# Patient Record
Sex: Female | Born: 1940 | Race: White | Hispanic: No | Marital: Married | Smoking: Current every day smoker
Health system: Southern US, Community
[De-identification: ages and names within clinical notes are randomized; demographics above are authoritative.]

## PROBLEM LIST (undated history)

## (undated) DIAGNOSIS — Q2381 Bicuspid aortic valve: Secondary | ICD-10-CM

## (undated) DIAGNOSIS — I480 Paroxysmal atrial fibrillation: Secondary | ICD-10-CM

## (undated) DIAGNOSIS — E039 Hypothyroidism, unspecified: Secondary | ICD-10-CM

## (undated) DIAGNOSIS — D696 Thrombocytopenia, unspecified: Secondary | ICD-10-CM

## (undated) DIAGNOSIS — G4730 Sleep apnea, unspecified: Secondary | ICD-10-CM

## (undated) DIAGNOSIS — E785 Hyperlipidemia, unspecified: Secondary | ICD-10-CM

## (undated) DIAGNOSIS — M5430 Sciatica, unspecified side: Secondary | ICD-10-CM

## (undated) DIAGNOSIS — T7840XA Allergy, unspecified, initial encounter: Secondary | ICD-10-CM

## (undated) DIAGNOSIS — D649 Anemia, unspecified: Secondary | ICD-10-CM

## (undated) DIAGNOSIS — I10 Essential (primary) hypertension: Secondary | ICD-10-CM

## (undated) DIAGNOSIS — C801 Malignant (primary) neoplasm, unspecified: Secondary | ICD-10-CM

## (undated) DIAGNOSIS — F419 Anxiety disorder, unspecified: Secondary | ICD-10-CM

## (undated) DIAGNOSIS — M1990 Unspecified osteoarthritis, unspecified site: Secondary | ICD-10-CM

## (undated) DIAGNOSIS — E079 Disorder of thyroid, unspecified: Secondary | ICD-10-CM

## (undated) HISTORY — DX: Hyperlipidemia, unspecified: E78.5

## (undated) HISTORY — DX: Essential (primary) hypertension: I10

## (undated) HISTORY — DX: Allergy, unspecified, initial encounter: T78.40XA

## (undated) HISTORY — DX: Anemia, unspecified: D64.9

## (undated) HISTORY — DX: Sciatica, unspecified side: M54.30

## (undated) HISTORY — DX: Malignant (primary) neoplasm, unspecified: C80.1

## (undated) HISTORY — PX: BILATERAL CARPAL TUNNEL RELEASE: SHX6508

## (undated) HISTORY — PX: JOINT REPLACEMENT: SHX530

## (undated) HISTORY — PX: ABDOMINAL HYSTERECTOMY: SHX81

## (undated) HISTORY — PX: SPINAL CORD STIMULATOR IMPLANT: SHX2422

## (undated) HISTORY — PX: BREAST CYST ASPIRATION: SHX578

## (undated) HISTORY — PX: COLONOSCOPY: SHX5424

## (undated) HISTORY — PX: APPENDECTOMY: SHX54

## (undated) HISTORY — PX: KNEE SURGERY: SHX244

## (undated) HISTORY — PX: OTHER SURGICAL HISTORY: SHX169

## (undated) HISTORY — DX: Disorder of thyroid, unspecified: E07.9

## (undated) HISTORY — PX: BACK SURGERY: SHX140

## (undated) HISTORY — DX: Thrombocytopenia, unspecified: D69.6

---

## 2001-06-03 ENCOUNTER — Ambulatory Visit (HOSPITAL_COMMUNITY): Admission: RE | Admit: 2001-06-03 | Discharge: 2001-06-03 | Payer: Self-pay | Admitting: Orthopedic Surgery

## 2001-06-03 ENCOUNTER — Encounter: Payer: Self-pay | Admitting: Orthopedic Surgery

## 2001-08-17 ENCOUNTER — Ambulatory Visit (HOSPITAL_COMMUNITY): Admission: RE | Admit: 2001-08-17 | Discharge: 2001-08-17 | Payer: Self-pay | Admitting: Orthopedic Surgery

## 2003-06-05 ENCOUNTER — Encounter: Payer: Self-pay | Admitting: Orthopedic Surgery

## 2003-06-06 ENCOUNTER — Inpatient Hospital Stay (HOSPITAL_COMMUNITY): Admission: RE | Admit: 2003-06-06 | Discharge: 2003-06-10 | Payer: Self-pay | Admitting: Orthopedic Surgery

## 2003-06-06 ENCOUNTER — Encounter: Payer: Self-pay | Admitting: Orthopedic Surgery

## 2004-01-31 ENCOUNTER — Encounter: Admission: RE | Admit: 2004-01-31 | Discharge: 2004-01-31 | Payer: Self-pay | Admitting: Orthopedic Surgery

## 2004-11-26 ENCOUNTER — Ambulatory Visit: Payer: Self-pay | Admitting: Internal Medicine

## 2005-03-12 ENCOUNTER — Ambulatory Visit (HOSPITAL_COMMUNITY): Admission: RE | Admit: 2005-03-12 | Discharge: 2005-03-12 | Payer: Self-pay | Admitting: Orthopedic Surgery

## 2005-03-12 ENCOUNTER — Ambulatory Visit (HOSPITAL_BASED_OUTPATIENT_CLINIC_OR_DEPARTMENT_OTHER): Admission: RE | Admit: 2005-03-12 | Discharge: 2005-03-12 | Payer: Self-pay | Admitting: Orthopedic Surgery

## 2005-07-06 ENCOUNTER — Ambulatory Visit: Payer: Self-pay | Admitting: Internal Medicine

## 2006-04-21 ENCOUNTER — Ambulatory Visit: Payer: Self-pay

## 2006-05-05 ENCOUNTER — Encounter: Admission: RE | Admit: 2006-05-05 | Discharge: 2006-05-05 | Payer: Self-pay | Admitting: Orthopedic Surgery

## 2007-03-02 ENCOUNTER — Encounter: Admission: RE | Admit: 2007-03-02 | Discharge: 2007-03-02 | Payer: Self-pay | Admitting: Orthopedic Surgery

## 2007-04-22 ENCOUNTER — Ambulatory Visit (HOSPITAL_COMMUNITY): Admission: RE | Admit: 2007-04-22 | Discharge: 2007-04-24 | Payer: Self-pay | Admitting: Orthopedic Surgery

## 2007-07-29 ENCOUNTER — Ambulatory Visit: Payer: Self-pay | Admitting: Internal Medicine

## 2007-08-06 ENCOUNTER — Ambulatory Visit: Payer: Self-pay | Admitting: Family Medicine

## 2007-10-18 ENCOUNTER — Ambulatory Visit: Payer: Self-pay | Admitting: Internal Medicine

## 2007-12-24 ENCOUNTER — Ambulatory Visit: Payer: Self-pay | Admitting: Internal Medicine

## 2008-06-12 ENCOUNTER — Ambulatory Visit (HOSPITAL_BASED_OUTPATIENT_CLINIC_OR_DEPARTMENT_OTHER): Admission: RE | Admit: 2008-06-12 | Discharge: 2008-06-12 | Payer: Self-pay | Admitting: Orthopedic Surgery

## 2008-08-09 ENCOUNTER — Ambulatory Visit: Payer: Self-pay | Admitting: Internal Medicine

## 2008-08-22 ENCOUNTER — Ambulatory Visit: Payer: Self-pay | Admitting: Internal Medicine

## 2008-08-28 ENCOUNTER — Ambulatory Visit: Payer: Self-pay | Admitting: Internal Medicine

## 2008-09-22 ENCOUNTER — Ambulatory Visit: Payer: Self-pay | Admitting: Internal Medicine

## 2008-11-10 ENCOUNTER — Ambulatory Visit: Payer: Self-pay | Admitting: Internal Medicine

## 2008-11-16 ENCOUNTER — Inpatient Hospital Stay (HOSPITAL_COMMUNITY): Admission: RE | Admit: 2008-11-16 | Discharge: 2008-11-20 | Payer: Self-pay | Admitting: Orthopedic Surgery

## 2008-12-21 ENCOUNTER — Ambulatory Visit: Payer: Self-pay | Admitting: Internal Medicine

## 2009-02-20 ENCOUNTER — Ambulatory Visit: Payer: Self-pay | Admitting: Internal Medicine

## 2009-02-28 ENCOUNTER — Ambulatory Visit: Payer: Self-pay | Admitting: Internal Medicine

## 2009-03-22 ENCOUNTER — Ambulatory Visit: Payer: Self-pay | Admitting: Internal Medicine

## 2009-08-22 ENCOUNTER — Ambulatory Visit: Payer: Self-pay | Admitting: Internal Medicine

## 2009-12-20 ENCOUNTER — Ambulatory Visit: Payer: Self-pay | Admitting: Internal Medicine

## 2010-07-08 ENCOUNTER — Ambulatory Visit: Payer: Self-pay | Admitting: Anesthesiology

## 2010-10-02 ENCOUNTER — Ambulatory Visit: Payer: Self-pay | Admitting: Internal Medicine

## 2010-10-06 ENCOUNTER — Ambulatory Visit: Payer: Self-pay | Admitting: Anesthesiology

## 2011-01-02 LAB — PROTIME-INR
INR: 2.4 — ABNORMAL HIGH (ref 0.00–1.49)
Prothrombin Time: 27.8 seconds — ABNORMAL HIGH (ref 11.6–15.2)

## 2011-01-07 LAB — BASIC METABOLIC PANEL
BUN: 5 mg/dL — ABNORMAL LOW (ref 6–23)
Chloride: 101 mEq/L (ref 96–112)
Creatinine, Ser: 0.52 mg/dL (ref 0.4–1.2)
Creatinine, Ser: 0.62 mg/dL (ref 0.4–1.2)
GFR calc Af Amer: >60 mL/min (ref >60)
GFR calc Af Amer: >60 mL/min (ref >60)
GFR calc non Af Amer: >60 mL/min (ref >60)
GFR calc non Af Amer: >60 mL/min (ref >60)
GFR calc non Af Amer: >60 mL/min (ref >60)
Potassium: 3.1 mEq/L — ABNORMAL LOW (ref 3.5–5.1)
Potassium: 3.3 mEq/L — ABNORMAL LOW (ref 3.5–5.1)
Potassium: 3.6 mEq/L (ref 3.5–5.1)
Potassium: 4.3 mEq/L (ref 3.5–5.1)
Sodium: 136 mEq/L (ref 135–145)
Sodium: 137 mEq/L (ref 135–145)
Sodium: 144 mEq/L (ref 135–145)

## 2011-01-07 LAB — TYPE AND SCREEN: Antibody Screen: NEGATIVE

## 2011-01-07 LAB — CBC
HCT: 32 % — ABNORMAL LOW (ref 36.0–46.0)
HCT: 45.4 % (ref 36.0–46.0)
Hemoglobin: 15.3 g/dL — ABNORMAL HIGH (ref 12.0–15.0)
MCV: 92.3 fL (ref 78.0–100.0)
Platelets: 113 10*3/uL — ABNORMAL LOW (ref 150–400)
RBC: 3.24 MIL/uL — ABNORMAL LOW (ref 3.87–5.11)
RBC: 3.62 MIL/uL — ABNORMAL LOW (ref 3.87–5.11)
RBC: 4.84 MIL/uL (ref 3.87–5.11)
WBC: 7.2 10*3/uL (ref 4.0–10.5)
WBC: 7.9 10*3/uL (ref 4.0–10.5)
WBC: 8.8 10*3/uL (ref 4.0–10.5)
WBC: 9.2 10*3/uL (ref 4.0–10.5)

## 2011-01-07 LAB — PROTIME-INR
INR: 2.5 — ABNORMAL HIGH (ref 0.00–1.49)
Prothrombin Time: 15.7 seconds — ABNORMAL HIGH (ref 11.6–15.2)
Prothrombin Time: 21.4 seconds — ABNORMAL HIGH (ref 11.6–15.2)
Prothrombin Time: 28.5 seconds — ABNORMAL HIGH (ref 11.6–15.2)

## 2011-01-07 LAB — APTT: aPTT: 32 seconds (ref 24–37)

## 2011-02-04 NOTE — Op Note (Signed)
NAME:  [name], [name]             ACCOUNT NO.:  [id number]   MEDICAL RECORD NO.:  [id number]          PATIENT TYPE:  AMB   LOCATION:  DAY                          FACILITY:  [facility]   PHYSICIAN:  Marlowe Kays, M.D.  DATE OF BIRTH:  November 22, 1940   DATE OF PROCEDURE:  04/22/2007  DATE OF DISCHARGE:                               OPERATIVE REPORT   PREOPERATIVE DIAGNOSIS:  Spinal stenosis, L4-5.   POSTOPERATIVE DIAGNOSIS:  Spinal stenosis, L4-5.   OPERATION:  Central and foraminal decompressive laminectomy, L4-5.   SURGEON:  Marlowe Kays, M.D.   ASSISTANT:  Georges Lynch. Darrelyn Hillock, M.D.   ANESTHESIA:  General.   PATHOLOGY AND INDICATION FOR PROCEDURE:  She has had back and bilateral  leg pain, left much more severe than the right, and over the last 2  weeks has noted progressive dragging of her left leg and demonstrates  some weakness of dorsiflexion of her foot today on examination.  She has  had both an MRI, which demonstrated no definite operative indication  with some disk bulges at a number of levels but a suggestion of spinal  stenosis.  For this reason a lumbar myelogram and CT scan was performed  on March 02, 2007, which the dominant finding being lateral recess  stenosis at L4-5 on the left.  There was not felt to be a significant  stenosis at L3-4 or above or at L5-S1.  With the progressive findings  and weakness, it was felt that the L4-5 decompression was the  appropriate procedure.   PROCEDURE:  Prophylactic antibiotics, satisfactory general anesthesia,  prone position on the Wilson frame to protect her knee replacement.  Back was prepped with DuraPrep and with three spinal needles and lateral  x-ray, I tentatively located the L4-5 operative field and I then  continued draping the back in a sterile field.  Time-out performed.  Midline incision.  The two spinous processes in the center of the wound  were marked as L3 and L4 on the lateral x-ray.  Accordingly, I extended  the incision distalward and continued dissecting the soft tissue off  what we felt was L5, L4 and working up to L3 neural arches.  Two self-  retaining McCullough retractors were inserted, then with a double-action  rongeur removed the spinous process of L4, working up to L3 neural arch  and also downward, removing a little bit of the superior neural arch of  L5.  Then began more detailed decompression with 2 and 3-mm Kerrison  rongeurs, ultimately bringing in a microscope, and we took finally a  third x-ray to be sure that the limits of the decompression were from L4  to L5.  Clinically she had satisfactory decompression to hockey stick  cephalad and caudad.  This third x-ray confirmed that we had the limits  of the decompression as desired.  After completing central and lateral  recess decompression, all foramina were patent to hockey stick,  particularly at L4-5 on the left.  She also was widely patent both  proximal and distal centrally to hockey stick.  The wound then was then  well-irrigated with sterile  saline.  Gelfoam soaked in thrombin was  placed over the dura.  The self-retaining retractors were carefully  removed.  Closure was then performed with interrupted #1 Vicryl in the  paralumbar muscle and fascia as a unit, leaving the distal centimeter  and a half open for any egress of blood.  Subcutaneous tissue was  closed with a combination of #1 and 2-0 Vicryl, staples in the skin.  Betadine and Adaptic dry sterile dressing were applied.  She was gently  placed in log-roll fashion onto her PACU bed and taken there in  satisfactory condition with no known complications.  Estimated blood  loss was perhaps 150 mL, no blood replacement.           ______________________________  Marlowe Kays, M.D.     JA/MEDQ  D:  04/22/2007  T:  04/22/2007  Job:  [id number]

## 2011-02-04 NOTE — Op Note (Signed)
[name], [name]             ACCOUNT NO.:  [id number]   MEDICAL RECORD NO.:  [id number]          PATIENT TYPE:  INP   LOCATION:  1602                         FACILITY:  [facility]   PHYSICIAN:  Marlowe Kays, M.D.  DATE OF BIRTH:  07-13-1941   DATE OF PROCEDURE:  11/16/2008  DATE OF DISCHARGE:                               OPERATIVE REPORT   PREOPERATIVE DIAGNOSIS:  Osteoarthritis, left knee.   POSTOPERATIVE DIAGNOSIS:  Osteoarthritis, left knee.   OPERATION:  Osteonics total knee replacement, left.   SURGEON:  Marlowe Kays, M.D.   ASSISTANTDruscilla Brownie. Underwood, P.A.-C.   ANESTHESIA:  General.   PATHOLOGY AND JUSTIFICATION FOR PROCEDURE:  She has had a successful  total knee replacement on the right.  On the left, she has had  progressive pain which has proved refractory to viscosupplementation and  anti-inflammatory medication.  She has tricompartmental arthritis and is  here today for the above-mentioned surgery.   PROCEDURE:  Prophylactic antibiotics, satisfactory general anesthesia,  Foley catheter inserted, pneumatic tourniquet applied along with lateral  hip stabilizer and Sure Foot.  Left leg was prepped with DuraPrep from  tourniquet to ankle, draped in sterile field.  Ioban employed.  Leg  esmarch out nonsterilely.  Originally, tourniquet was inflated to 300  mmHg, but because some slight breakthrough bleeding, I increased it to  325 mm.  Time out performed.  Vertical midline incision down to the  patellar mechanism with median parapatellar incision to open the joint.  The patellar mechanism was freed up and patella everted and the knee  flexed.  Osteophytes were removed from around the femur and patella, and  the pes anserinus and medial collateral ligament were undermined off the  proximal tibia.  Portions of the menisci and ACL/PCL complex were  removed.  We then made a 5/16 inch drill hole in the distal femur  followed by canal finder and the axis aligner  set for 5 degrees distal  femoral cut.  Because she had a slight flexion contracture, I elected to  take 12 mm rather than the usual 10 off the distal femur.  I then went  to try and size the distal femur, but because of tightness in the knee,  I went ahead and made my initial leveling cut on the tibia which allowed  me to get the jig in for sizing the femur at size 5.  Scribe lines were  placed to apply the distal femoral cutting jig, and anterior and  posterior cuts and posterior/anterior chamferings were made.  I then  returned to the tibia and the baseplate measured and the size 5 there as  well, placing my initial step-cut drill hole followed by canal finder  and the axis aligner to apply a 90-degree cut with 4 mm of bone removed  from the medial depressed side.  After this cut was performed, I then  placed a laminar spreader removed remnants of bone and soft tissue from  behind the condyles, and I then placed the jig for creating both the  groove for the patella and the notchplasty.  After these operations were  performed, I then went through a trial reduction and found that we had  adequate bone resection with a 10-mm spacer, and we then proceeded to  prepare the patella.  She sized a 26 mm.  I used the 10-mm recess  cutting jig to make the recess cut followed by 3 stabilization holes.  I  then placed a trial patella in the insert and trimmed up excess bone  from around the perimeter.  I then returned to the tibia.  I previously  on checking sizing of the components had used extramedullary rod  splitting the bimalleolar distance and placing scribe lines on the  anterior tibia, and I then placed the baseplate stabilizing the 3 pins  based on these holes, and we used the tripod apparatus to ream for the  stem on the tibia component up to a 5 cemented.  I then water picked the  knee while the components were opened and the methacrylate was mixed and  centrifuged.  I then individually  glued in the components starting first  with the tibia, followed by the femur and then the patella, and in each  case, removing excess methacrylate.  When the methacrylate hardened, we  checked to make sure there were not any small areas that had been left  behind, and we then went through a trial reduction with the different  spacers, working up to 15 mm which seemed to be the best fit.  Accordingly, after irrigating the wound well, I placed the final 50-mm  posterior stabilized Scorpio insert.  The knee had excellent motion,  good stability, and no patellar lateral release was required.  Hemovac  was placed.  The wound closed in layers with #1 Vicryl in 2 layers the  quadriceps tendon, synovium and capsule distally.  Combination of #1 and  2-0 Vicryl in subcutaneous tissues, staples in the skin.  Dry sterile  dressing was applied.  Tourniquet was released prior to closure with  roughly an hour and a half of tourniquet time having elapsed.  There  were no operative complications and essentially no blood loss with no  blood replacement.           ______________________________  Marlowe Kays, M.D.     JA/MEDQ  D:  11/16/2008  T:  11/16/2008  Job:  [id number]

## 2011-02-04 NOTE — Op Note (Signed)
NAME:  [name], TEMKIN             ACCOUNT NO.:  [id number]   MEDICAL RECORD NO.:  [id number]          PATIENT TYPE:  AMB   LOCATION:  [facility]                         FACILITY:  [facility]   PHYSICIAN:  Marlowe Kays, M.D.  DATE OF BIRTH:  [birth year]   DATE OF PROCEDURE:  06/12/2008  DATE OF DISCHARGE:                               OPERATIVE REPORT   PREOPERATIVE DIAGNOSES:  1. Torn medial meniscus.  2. Osteoarthritis left knee.   POSTOPERATIVE DIAGNOSIS:  1. Torn medial and lateral menisci.  2. Osteoarthritis left knee.   OPERATION:  Left knee arthroscopy with  1. Partial medial and lateral meniscectomy.  2. Shaving of medial femoral condyle and lateral tibial plateau.   PATHOLOGY AND JUSTIFICATION FOR PROCEDURE:  She has a history of her  right total knee replacement and a prior arthroscopy in her left knee.  Recently she has had increased pain and swelling of left knee with an  MRI on May 30, 2008 demonstrated torn medial meniscus as well as  advanced osteoarthritis.  Accordingly, she is here for the above-  mentioned surgery.  She understands that the arthroscopy will not be  designed to particularly improve the status of her degenerative  arthritis.   PROCEDURE:  Prophylactic antibiotics, Ace wrap to right lower extremity  with knee support, left lower extremity pneumatic tourniquet with the  leg Esmarch out non sterilely.  Thigh stabilizer applied and leg was  prepped with DuraPrep from stabilizer to ankle and draped in sterile  field.  Time-out performed.  Superior medial saline inflow through the  old portal site.  First through an anterolateral portal, medial  compartment of the knee joint was evaluated.  She had floating areas of  articular cartilage and grade 3/4 wear of most of the medial femoral  condyle.  With a 3.5 shaver,  I debrided all of this down as well as  made a preliminary debridement of a badly torn remainder of the medial  meniscus in both the  posterior curve and the intercondylar areas.  I  then trimmed the meniscus back to a more stable rim with baskets and  shaved down once again with 3.5 shaver with final pictures being taken.  She also was noted to have ridged areas of significant chondromalacia of  the medial tibial plateau.  I tried to go up the medial gutter and  suprapatellar area but was unable to get the scope past the patella and  did not want to break it.  I then reversed portals and in the lateral  compartment she had a good bit of synovitis and tear of the anterior  third of the lateral meniscus as well as the posterior and middle  portions, all of which I shaved down until smooth with combination of  3.5 shaver, small baskets and arthroscopic scissors.  She also had an  deep gouge out of the lateral tibial plateau.  Her lateral femoral  condyle only had minimal wear.  Also from this portal I tried to get up  into the lateral gutter and suprapatellar area and once again was  stymied by obstruction, either fibrous  tissue or osteophytes and again  did not want to destroyed the scope.  The knee joint was irrigated until  clear and all fluid possible removed.  The two anterior portals were  closed with 4-0 nylon.  I then injected through the inflow apparatus 20  mL  of 0.5% Marcaine with adrenaline and then closed this portal with 4-0  nylon as well.  Betadine Adaptic, dry sterile dressing were applied.  Tourniquet was released.  She tolerated the procedure well and was taken  to recovery room in satisfactory condition with no known complications.           ______________________________  Marlowe Kays, M.D.     JA/MEDQ  D:  06/12/2008  T:  06/13/2008  Job:  [id number]

## 2011-02-04 NOTE — H&P (Signed)
NAME:  [name], [name]             ACCOUNT NO.:  [id number]   MEDICAL RECORD NO.:  [id number]          PATIENT TYPE:  INP   LOCATION:                               [facility]ACILITY[facility]  [facility]   PHYSICIAN:  Marlowe Kays, M.D.  DATE OF BIRTH:  10/21/1940   DATE OF ADMISSION:  11/16/2008  DATE OF DISCHARGE:                              HISTORY & PHYSICAL   CHIEF COMPLAINT:  Pain in my left knee.   PRESENT ILLNESS:  This 70 year old white female is seen by [place] for  continuing progressive problems concerning pain into her left knee.  We  have tried conservative care to the knee, and she really has not  responded.  On July 26, 2008, she received a corticosteroid injection  which gave her little to no relief.  We discussed viscosupplementation  into the knee, but since she has a considerable amount of osteoarthritis  in compartments of the knee and the fact that she did really respond to  that mode of treatment in her right knee prior to her right total knee  replacement arthroplasty, she declined that route.  We, therefore, made  plans to go for total knee replacement arthroplasty on the left.  All  risks and benefits of surgery were described to the patient.   PAST MEDICAL HISTORY:  This lady has been in relatively good health  throughout her lifetime.  She has been cleared preoperatively by Verlan Friends, FNP, with Dr. Beverely Risen.   ALLERGIES:  The patient is allergic to CODEINE and GUAIFENESIN.  The  patient is allergic to ADHESIVE TAPE, but she can use the Micropore  paper tape with no problem.   She is being treated for hypertension, sleep apnea,  hypercholesterolemia, reflux and hypothyroidism.  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  She uses  a CPAP machine at home with O2.   CURRENT MEDICATIONS:  1. Lexapro 10 mg daily.  2. Gemfibrozil 600 mg b.i.d.  3. Nexium 40 mg b.i.d.  4. Levothyroxine 137 mcg 1 daily.  5.  Bisoprolol/hydrochlorothiazide 5/6.25 mg daily.  6. Zyrtec 10 mg daily.  7. Furosemide 20 mg daily.  8. Tussionex suspension extended-release 1 teaspoon twice a day.  9. Alprazolam 0.5 mg b.i.d.  10.Requip 1 mg daily.  11.Zetia 10 mg daily.  12.Lipitor 20 mg daily.  13.Omeprazole 40 mg daily.  14.Trazodone 50 mg at bedtime.  15.Patanase nasal spray 1 spray per nostril once daily.  16.Fluticasone propionate nasal spray 1 spray per nostril daily.   PAST SURGERIES:  Include right total knee replacement arthroplasty in  2005, carpal tunnel release in 2007, back surgery in 2008.  She had  arthroscopic surgery in 2009.   FAMILY HISTORY:  Positive for her father deceased with tick fever at a  young age.  Her mother died of a stroke.  She has 4 sisters and 1  brother in relatively good health.  She has 4 boys, one deceased of  heart attack.   SOCIAL HISTORY:  The patient is married.  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  She does not smoke at the current time but has in  the past.  No intake of alcohol products.  Her caregiver will be her  husband after surgery with home health.  She lives in a single-level  home.   REVIEW OF SYSTEMS:  CNS: No seizures disorder, paralysis, numbness,  double vision.  RESPIRATORY:  No productive cough, no hemoptysis, no  shortness of breath.  CARDIOVASCULAR:  No chest pain, no angina, no  orthopnea.  GASTROINTESTINAL:  No nausea or vomiting, no melena, no  bloody stools.  GENITOURINARY:  No discharge, dysuria or hematuria.  MUSCULOSKELETAL:  Primarily in present illness.   PHYSICAL EXAMINATION:  GENERAL:  Alert, cooperative, friendly, somewhat  anxious 70 year old white female who is 5 feet 3 inches and weighs 185  pounds.  VITAL SIGNS:  Blood pressure 150/72 seated right arm, pulse 62  regular, respirations 12.  HEENT:  Normocephalic.  PERRLA, EOMs intact.  Oropharynx is clear with  dentures.  NECK:  Supple, no lymphadenopathy.  CHEST:   Clear to auscultation, no rhonchi, no rales, no wheezes.  HEART:  Regular rate and rhythm.  There is a grade 3/6 murmur at the  left sternal border.  ABDOMEN:  Obese, soft, nontender, liver and spleen not felt.  GENITALIA/RECTAL/PELVIC/BREASTS:  Not done as not pertinent to present  illness.  EXTREMITIES:  The patient has good circulation in both lower  extremities.  She has pain and crepitus with range of motion of left  knee and pain on the medial joint space with palpation.   ADMITTING DIAGNOSES:  1. Osteoarthritis of the left knee, end stage.  2. Hypertension.  3. Dyslipidemia.  4. Reflux.  5. Sleep apnea.  6. Hypothyroidism.   PLAN:  The patient will undergo left total knee replacement  arthroplasty.  After regular hospitalization, she will be sent home to  home health with her husband as the caretaker.      [name].    ______________________________  Marlowe Kays, M.D.   DLU/MEDQ  D:  11/06/2008  T:  11/06/2008  Job:  [id number]   cc:   Beverely Risen, M.D.   Verlan Friends, FNP

## 2011-02-04 NOTE — Op Note (Signed)
NAME:  [name], KOROMA             ACCOUNT NO.:  [id number]   MEDICAL RECORD NO.:  [id number]          PATIENT TYPE:  AMB   LOCATION:  [facility]                         [facility]ACILITY[facility]  [facility]   PHYSICIAN:  Marlowe Kays, M.D.  DATE OF BIRTH:  1940-10-05   DATE OF PROCEDURE:  06/12/2008  DATE OF DISCHARGE:                               OPERATIVE REPORT   PREOPERATIVE DIAGNOSES:  1. Torn medial meniscus.  2. Osteoarthritis left knee.   POSTOPERATIVE DIAGNOSES:  1. Torn medial and lateral menisci.  2. Advanced osteoarthritis left knee.   OPERATION:  Left knee arthroscopy with  1. Partial medial and lateral meniscectomy.  2. Shaving of medial femoral condyle, lateral tibial plateau.   SURGEON:  Marlowe Kays, M.D.   ASSISTANT:  Nurse.   ANESTHESIA:  General.   PATHOLOGY AND JUSTIFICATION FOR PROCEDURE:  She has had a prior right  total knee replacement and a prior left knee arthroscopy.  Recently she  has had much more in the way of pain and swelling in the left knee with  an MRI demonstrating a torn medial meniscus and tricompartmental  arthritis.  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   PROCEDURE:  Left knee   DICTATION ENDED HERE           ______________________________  Marlowe Kays, M.D.     JA/MEDQ  D:  06/12/2008  T:  06/13/2008  Job:  [id number]

## 2011-02-07 NOTE — Op Note (Signed)
NAME:  [name], [name]                       ACCOUNT NO.:  [id number]   MEDICAL RECORD NO.:  [id number]                   PATIENT TYPE:  INP   LOCATION:  0003                                 FACILITY:  [facility]   PHYSICIAN:  Marlowe Kays, M.D.               DATE OF BIRTH:  February 26, 1941   DATE OF PROCEDURE:  06/06/2003  DATE OF DISCHARGE:                                 OPERATIVE REPORT   PREOPERATIVE DIAGNOSIS:  Osteoarthritis, right knee.   POSTOPERATIVE DIAGNOSIS:  Osteoarthritis, right knee.   OPERATION/PROCEDURE:  Osteonics 12 mm place, right.   SURGEON:  Marlowe Kays, M.D.   ASSISTANT:  Georges Lynch. Darrelyn Hillock, M.D.   ANESTHESIA:  General.   PATHOLOGY AND INDICATIONS FOR THE PROCEDURE:  She has had a previous knee  arthroscopy and __________ supplementation but has tricompartmental  arthritis, particularly medially with medial joint line narrowing and  spurring and because of the chronic pain, is here today for the above-  mentioned surgery.   DESCRIPTION OF PROCEDURE:  Prophylactic antibiotics.  Satisfactory general  anesthesia.  Foley catheter inserted.  Pneumatic tourniquet.  Sure foot and  lateral hip stabilizer.  Right leg was prepped with DuraPrep, draped in a  sterile field.  Ioban employed.  Vertical midline incision down to the  patella mechanism with median parapatellar incision to open the joint.  Osteophytes around the patella and femur were removed.  The patella  mechanism was freed up and patella everted.  ACL was sacrificed because her  knee was favorable for retained posterior cruciate ligament so I retained  it.  Anterior portions of both menisci were freed up and removed and the  patient's __________ and medal collateral ligament were undermined over the  medial tibia.  Then placed a 5/16-inch drill hole in the intercondylar notch  up in the femur followed by the canal finder and the access liner set for  five degrees for the right knee.  Made a 10 mm  distal femoral cut and then  used the sizing jig, sized her at a 7.  The scribe lines were placed for the  distal femoral cutting jig which was then placed and anterior and posterior  cuts and posterior and anterior chamferings were made.  Then went to the  tibia where initial leveling cut was made and she was measured out as either  a 5 or a 7 and subsequently we used a 5.  The tibial tray for a 7 was  initially used followed with intramedullary drill hole placed followed by  step cut drill and the intramedullary rod.  Then we lined up the  intramedullary device with external rods splitting bimalleolar distance,  taking a 4 mm cut off the depressed medial tibial plateau with a 5-degree  posterior cut.  The cut was then made and we then placed lamina spreaders  and removed bone from the posterior condyles that was remaining as well as  some residual meniscus.  The guide for making the patellar groove was then  placed and the groove constructed and we then went through a trial  reduction, found the knee was a little tight in extension so I ended up  taking an extra 2 mm off the proximal tibia after replacing the cutting jig,  and then she had a nice motion to the knee with full extension, excellent  flexion and good stability.  We then sized the tibia at a 5 and used the  external alignment rod, once again splitting bimalleolar distance to mark  the flanges of the tibial tray on the anterior tibia.  While the knee was in  extension, I then used the patellar recess cutting jig to make a 10 mm  recessed cut.  The three drill holes were then placed with the guide and a  trial patella placed and the excess bone trimmed up from around the  parameter.  We then attached the metal #5 tray to the tibia and used the  tower apparatus to ream for the keel up to 5 cemented.  The knee was then  waterpicked while methyl methacrylate was being mixed with the vacuum mixer  and the glue was then applied with  a glue gun, gluing in first the tibial  component after I placed some Gelfoam in the canal, impacting it tightly and  removing excessive methacrylate.  We then did the same for the femur,  packing the canal also with Gelfoam.  The knee was then held in extension  while we glued in the patella using patella holding clamp.  After the  methacrylate hardened, we removed small amounts of methacrylate from around  the three components and checked posteriorly to be sure there was no glue  remaining.  The wound was irrigated well with sterile saline and since the  10 mm spacer worked perfectly, we used the 10 mm Flex Scorpio cruciate  retaining insert.  This was placed and checked and the knee was then taken  through an excellent range of motion with good stability.  She did not  require a lateral patellar release with the patella gliding in the midline.  Hemovac was placed and the wound closed with interrupted #1 Vicryl in two  layers in the quadriceps and distally in the synovium and capsule, a  combination of #1 and 2-0 Vicryl in the subcutaneous tissue and staples on  the skin.  Betadine, Adaptic and dry sterile dressing were applied.  Tourniquet was released after slightly more than 90 minutes of tourniquet  time had elapsed.  She tolerated the procedure well and after applying knee  immobilizer was taken to the recovery room in satisfactory condition  with  no known complications.                                               Marlowe Kays, M.D.    JA/MEDQ  D:  06/06/2003  T:  06/06/2003  Job:  [id number]

## 2011-02-07 NOTE — H&P (Signed)
NAME:  [name], [name]                       ACCOUNT NO.:  [id number]   MEDICAL RECORD NO.:  [id number]                   PATIENT TYPE:  INP   LOCATION:  NA                                   FACILITY:  [facility]   PHYSICIAN:  Marlowe Kays, M.D.               DATE OF BIRTH:  1941-06-11   DATE OF ADMISSION:  06/06/2003  DATE OF DISCHARGE:                                HISTORY & PHYSICAL   CHIEF COMPLAINT:  Pain in my knees, more so in the right than the left.   HISTORY OF PRESENT ILLNESS:  This 70 year old white female has been seen by  Dr. Simonne Come for continued and progressive problems concerning both of her  knees.  She has had arthroscopy to both knees in the past and arthroscopy x  2 to the right knee.  She has gone through the hyaluronic acid injections in  both knees, specifically Synvisc, with some results.  She has had this  problem now for well over several years.  Now x-rays have shown bone-on-bone  deformities, particularly in the right knee which is more symptomatic.  She  is a very active young lady, and she is having progressive problems  concerning getting about, doing her pleasurable activities, as well as  working at [employer].  For this reason, it was decided to go ahead with total  knee replacement arthroplasty to the right knee.   PAST MEDICAL HISTORY:  1. The patient has component of anxiety and some mild bipolar.  2. She is hypertensive.  3. Reflux disease.  4. Hypothyroidism.   MEDICATIONS:  1. Zoloft 100 mg 1 daily.  2. Synthroid 0.112 mg 1 daily.  3. Lotrel 51 mg/20 mg 1 daily.  4. Lipitor 40 mg 1 daily.  5. Xanax 0.5 mg t.i.d. p.r.n.  6. Nexium 40 mg 1 daily.  7. Tussionex suspension 1 teaspoon b.i.d.  8. Darvocet p.r.n. pain.   PRIMARY CARE PHYSICIAN:  Dr. Liz Beach is her family physician in  [place], [place].   PAST SURGICAL HISTORY:  1. Marshall-Marchetti in 1999.  2. Herniated rectum in 2000.  3. Left and right knee  arthroscopy in the past.  4. Hysterectomy in 1984.  5. Left inguinal hernia in 1955.  6. Right parotidectomy.   FAMILY HISTORY:  Positive for heart disease, diabetes, and hypertension.  Other medical problems in her brother, 78 years of age.   SOCIAL HISTORY:  The patient is married.  She is employed by [employer] in  [place].  Smokes one-half pack cigarettes per day, has no intake of ETOH.  She has four sons.  Her husband will assist in care giving after surgery.   REVIEW OF SYSTEMS:  CNS: No seizures, stroke, paralysis, numbness, double  vision.  RESPIRATORY:  No productive cough, no hemoptysis, no shortness of  breath.  CARDIOVASCULAR: No chest pain, no angina, no orthopnea.  GASTROINTESTINAL:  No nausea, vomiting, melena,  or bloody stools.  She does  well with her medications as far as reflux is concerned.  GENITOURINARY:  No  discharge, dysuria, or hematuria. MUSCULOSKELETAL:  Primarily as in History  of Present Illness with her knees.   PHYSICAL EXAMINATION:  GENERAL:  A pleasant, fully alert, 70 year old white  female.  VITAL SIGNS:  Blood pressure 160/80, pulse 78, respirations 12.  HEENT:  Normocephalic.  PERRLA. EOMs are intact.  Oropharynx is clear.  CHEST:  Clear to auscultation without rhonchi or rales.  HEART:  Regular rate and rhythm.  No murmurs are heard.  ABDOMEN:  Soft, nontender.  Liver and spleen not felt.  GENITALIA/RECTAL/PELVIC/BREASTS:  Not done, not pertinent to present  illness.  EXTREMITIES:  The patient's right knee is examined and found to have  crepitus on range of motion.  Boggy synovitis is noted with some mild  effusion.   ADMISSION DIAGNOSES:  1. Severe osteoarthritis of the right knee.  2. Hypertension.  3. Hypothyroidism.  4. Reflux.  5. Mild bipolar disorder.   PLAN:  The patient will undergo right total knee replacement arthroplasty.  Plan to have home health post hospitalization with [place].     [place] L. Cherlynn June.                  Marlowe Kays, M.D.    DLU/MEDQ  D:  05/30/2003  T:  05/30/2003  Job:  [id number]   cc:   Liz Beach, M.D.  [place], [place]

## 2011-02-07 NOTE — Op Note (Signed)
NAME:  [name], [name]             ACCOUNT NO.:  [id number]   MEDICAL RECORD NO.:  [id number]          PATIENT TYPE:  AMB   LOCATION:  NESC                         FACILITY:  [facility]   PHYSICIAN:  Marlowe Kays, M.D.  DATE OF BIRTH:  01-06-1941   DATE OF PROCEDURE:  03/12/2005  DATE OF DISCHARGE:                                 OPERATIVE REPORT   PREOPERATIVE DIAGNOSIS:  Right carpal tunnel syndrome, status post  successful left carpal tunnel release.   POSTOPERATIVE DIAGNOSIS:  Right carpal tunnel syndrome, status post  successful left carpal tunnel release.   OPERATION:  Decompression of the median nerve, right wrist and hand.   SURGEON:  Marlowe Kays, M.D.   ASSISTANT:  Nurse.   ANESTHESIA:  General.   INDICATIONS FOR PROCEDURE:  She had a previous left carpal tunnel release  with good results.  We saw her last week with her right carpal tunnel giving  her severe pain and numbness.  We were able to work her in today for the  above-mentioned surgery.  I planned to do this at [facility], but she had  so much pain on elevation of the arm, trying to esmarch out the arm with the  [facility] that she had to be converted to a general anesthetic.   PROCEDURE:  Satisfactory general anesthesia.  Prophylactic antibiotics.  Right arm was prepped with DuraPrep from the mid forearm to the fingertips  and was draped in the sterile field.  I marked out a curved incision along  the base of the thenar eminence, crossing obliquely over the flexor crease  over the wrist and the wrist of the distal forearm.  She had no palmaris  longus tendon.  The median nerve was identified at the wrist.  Bleeders were  coagulated with bipolar cautery.  I then progressively released the skin and  subcutaneous tissue and thick transverse fascia, which did extend into the  distal palm.  Individual branches of the median nerve were identified,  protected, and decompressed.  When I thoroughly decompressed  the nerve and  its branches, I released the tourniquet.  There was no unusual bleeding.  Soft tissues were infiltrated with 0.5% plain Marcaine.  The wound was  closed with interrupted 4-0 nylon mattress sutures.  Betadine, Adaptic, dry  sterile dressing, and a volar plaster splint were applied.  She tolerated  the procedure well and was taken to the recovery room in a satisfactory  condition with no known complications.       JA/MEDQ  D:  03/12/2005  T:  03/12/2005  Job:  [id number]

## 2011-02-07 NOTE — Discharge Summary (Signed)
NAME:  CITLALIC, NORLANDER                       ACCOUNT NO.:  [id number]   MEDICAL RECORD NO.:  [id number]                   PATIENT TYPE:  INP   LOCATION:  0467                                 [facility]AC[facility]   PHYSICIAN:  Marlowe Kays, M.D.               DATE OF BIRTH:  1940/10/19   DATE OF ADMISSION:  06/06/2003  DATE OF DISCHARGE:  06/10/2003                                 DISCHARGE SUMMARY   ADMISSION DIAGNOSES:  1. Severe osteoarthritis of the right knee.  2. Hypertension.  3. Hypothyroidism.  4. Reflux.  5. Mild bipolar disorder.   DISCHARGE DIAGNOSES:  1. Severe osteoarthritis of the right knee status post right total knee     arthroplasty.  2. Hypertension.  3. Hypothyroidism.  4. Reflux.  5. Mild bipolar disorder.  6. Postop hemorrhagic anemia and stable at the time of discharge.   PROCEDURES:  The patient was taken to the operating room on June 06, 2003 and underwent a right total knee arthroplasty by Marlowe Kays, M.D.  Assistant is Georges Lynch. Darrelyn Hillock, M.D.  Surgery was done under general  anesthesia and a Hemovac drain x1 was placed at the time of surgery.   CONSULTATIONS:  Physical therapy and occupational therapy, social work case  management.   BRIEF HISTORY:  The patient is a 70 year old female seen by Marlowe Kays,  M.D. for continued and progressive problems concerning both of her knees.  She has had an arthroscopy to both knees in the past and had an arthroscopy  twice to the right knee.  She has gone through hyaluronic acid injection in  both knees specifically Synvisc with some results.  She has had this problem  now for well over several years. X-ray now show a bone-on-bone deformity  particularly in her right knee which is the more symptomatic knee.  She is a  very active young lady and is having progressive problems concerning getting  about and doing her pleasurable activities as well as working at [employer].  For this reason, it  was decided to go ahead with total knee arthroplasty to  the right knee.  Risks and benefits of the surgery were discussed with the  patient and the patient wishes to proceed.   LABORATORY DATA:  CBC on admission showed a hemoglobin 12.5, hematocrit of  37.3, a white blood cell count 8.9, red blood cell count 4.47.  Serial H&H's  were followed throughout hospital stay.  Hemoglobin and hematocrit did  decline to 9.6 and 28.4 on June 09, 2003.  However, it was stable at  the time of discharge.  Differential on admission showed monocytes just  slightly high at 12.  Coagulation studies on admission were all within  normal limits.  PT and INR were 17.9 and 1.8 respectively at the time of  discharge on Coumadin therapy.  Routine chemistry on admission showed  glucose slightly high at 107.  Urinalysis  on admission was all within normal  limits.  The patient's blood type is A positive with antibody screen  negative.  A preoperative EKG revealed normal sinus rhythm with ST and T  wave abnormality considered anterolateral ischemia.  Preop chest x-ray  revealed lingula in right middle lobe, linear scarring, and atelectasis.  Preop x-ray of the knee revealed tricompartmental osteoarthritis most marked  medially.  Postop x-rays on June 06, 2003 revealed status post recent  right knee replacement without complicating feature.   HOSPITAL COURSE:  The patient was admitted to [facility] and taken  to the operating room.  She underwent the above-stated procedure without  complication.  The patient tolerated the procedure well and was allowed to  return to the recovery room and then the orthopedic floor in continued  postoperative care.  On postop day #1, June 07, 2003, the patient was  awake, alert, and oriented.  Was doing well and was neurovascularly intact  to the right lower extremity.  Hemovac was discontinued on this day and the  patient was being weaned from the PCA.  Was to  have her dressing changed on  following day.  On June 08, 2003, postop day #2, the patient was doing  well, resting comfortably.  She was neurovascularly intact.  Dressings  changed, staples intact.  Incision was clean, dry, and intact.  IV and PCA  were discontinued at this time.  Hemoglobin and hematocrit were 10.1 and  29.7.  On June 09, 2003, postop day #3, the patient had some slight  slurred speech.  Was taking Demerol at 100 mg every 3-4 hours.  Doses of the  Demerol was reduced.  She was otherwise neurovascularly intact to the right  lower extremity.  Incision was clean, dry, and intact.  On June 10, 2003, postop day #4, the patient was doing well.  T-max was 101.2.  However,  she was afebrile at the time of being seen.  She was ready for discharge.  She was neurovascularly intact to the right lower extremity.  Incision was  clean, dry, and intact.  The patient was discharged home on this date.   DISPOSITION:  The patient was discharged home on June 10, 2003.   DISCHARGE MEDICATIONS:  1. Demerol 50 mg, dispense #50, one p.o. q.4-6 h. p.r.n. pain.  2. Robaxin 500 mg, dispense #40, one p.o. q.6-8 h. p.r.n.  3. Trinsicon caplets, dispense #60, one p.o. b.i.d.  4. Coumadin per pharmacy protocol.  5. Phenergan 25 mg one p.o. q.6 h. p.r.n. nausea.   DIET:  As tolerated.   ACTIVITY:  The patient is weightbearing as tolerated.  Knee precautions.  [facility] for home care.  For the right lower extremity wound the patient is  to perform daily dressing changes until no drainage.  She is to shower when  there is no drainage.   FOLLOW UP:  The patient is to follow up with Marlowe Kays, M.D. two weeks  from the date of surgery.  She is to call our office for the appointment.   CONDITION ON DISCHARGE:  Stable and improved.     Clarene Reamer, P.A.-C.                   Marlowe Kays, M.D.    SW/MEDQ  D:  06/28/2003  T:  06/28/2003  Job:  [id number]

## 2011-02-07 NOTE — Op Note (Signed)
[facility]  Patient:    [name], [name] Visit Number: [id number] MRN: [id number]          Service Type: DSU Location: DAY Attending Physician:  Marlowe Kays Page Dictated by:   Illene Labrador. Aplington, M.D. Proc. Date: 08/17/01 Admit Date:  08/17/2001                             Operative Report  PREOPERATIVE DIAGNOSIS:  Torn medial meniscus and possible suprapatellar plica, left knee.  POSTOPERATIVE DIAGNOSES: 1. Torn medial and lateral menisci. 2. Osteoarthritis, left knee.  OPERATION: 1. Left knee arthroscopy with one partial medial and lateral meniscectomy. 2. Shaving of medial femoral condyle, lateral tibial plateau and patella.  SURGEON:  Illene Labrador. Aplington, M.D.  ASSISTANT:  Nurse.  ANESTHESIA:  General.  PATHOLOGY AND JUSTIFICATION OF PROCEDURE:  She has just recovered from arthroscopy in the right knee, developed pain and swelling in the left knee with an demonstrating the posterior horn heart rate of the medial meniscus as well as possible suprapatellar plica.  I could not find a plica at surgery. 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 Laterally she had significant fraying of most of the inner border of the lateral meniscus as well as a good bit of fraying of the lateral tibial plateau.  DESCRIPTION OF PROCEDURE:  Satisfactory general anesthesia, pneumatic tourniquet, thigh stabilizer.  Left knee was prepped with DuraPrep and draped in a sterile field.  Superior and medial saline inflow.  First through an anterolateral portal, the medial compartment of knee joint was evaluated.  She had a good bit of synovitis present which I resected with the 3.5 shaver.  The wear of the medial femoral condyle shaved down gently until smooth with the shaver as well.  She had an extensive tear of the posterior curve, going  back all the way to leaving just a very small meniscus rim and also had a good bit of tear of the intercondylar area of the medial meniscus as well.  Both of these areas were trimmed back until smooth with a variety of baskets and then shaved down until smooth with the 3.5 shaver.  The remaining meniscus was stable on probing.  Looking up in the medial gutter and suprapatellar area, I was unable to detect any definite plica.  She did have the wear of the patella I noted above, and I shaved the patella down until smooth the 3.5 shaver.  I then reversed portals.  There was a little bit of lateral synovectomy which I resected.  The lateral compartment pathology was noted and with the 3.5 shaver, I debrided up the lateral tibial plateau and also shaved down the lateral meniscus until smooth.  Looking up in the lateral gutter and suprapatellar area, a little additional synovectomy was performed.  The knee joint was then irrigated until clear and all fluid possible removed.  The two anterior portals were closed with 4-0 nylon.  Then 20 cc of 0.5% Marcaine with adrenalin was then instilled through the inflow apparatus.  No morphine was used because of a codeine allergy.  I then removed the inflow apparatus and closed this portal with 4-0 nylon as well.  Betadine, Adaptic dry sterile dressing were applied.  She tolerated the procedure well and at the time of this dictation was on the way to the  recovery room in satisfactory condition with no known complications. Dictated by:   Illene Labrador. Aplington, M.D. Attending Physician:  Joaquin Courts DD:  08/17/01 TD:  08/17/01 Job: [id number]

## 2011-02-07 NOTE — Op Note (Signed)
[facility]  Patient:    [name], [name] Visit Number: [id number] MRN: [id number]          Service Type: DSU Location: DAY Attending Physician:  Marlowe Kays Page Proc. Date: 06/03/01 Admit Date:  06/03/2001                             Operative Report  PREOPERATIVE DIAGNOSES: 1. Torn medial meniscus. 2. Osteoarthritis medial knee joint, right knee.  POSTOPERATIVE DIAGNOSES: 1. Torn medial and lateral menisci. 2. Osteoarthritis medial compartment knee joint, right knee.  OPERATION: 1. Right knee arthroscopy with partial medial and lateral meniscectomy. 2. Shaving of the medial femoral condyle.  SURGEON:  Illene Labrador. [place], M.D.  ASSISTANT:  Nurse.  ANESTHESIA:  General.  PATHOLOGY AND JUSTIFICATION FOR PROCEDURE:  She originally had a right knee arthroscopy about a year ago in [place] and reportedly had partial medial and lateral meniscectomies performed and was told she had an arthritic knee. She subsequently developed pain and swelling in her right knee.  Due to insurance considerations, she saw me on August 15.  I sent her for an MRI of her knee which demonstrated medial compartment arthritis and also an extensive medial meniscus tear.  This was confirmed at surgery today.  She also had a small tear of the posterior curve of the lateral meniscus which was noted on the MRI.  The lateral compartment of the knee joint looked good.  She had some mild chondromalacia of the patella which did not require shaving.  DESCRIPTION OF PROCEDURE:  Satisfactory general anesthesia, pneumatic tourniquet, thigh stabilizer.  The right knee was prepped with Dura-Prep and draped in a sterile field.  Superior medial saline inflow.  First, through an anteromedial portal, the lateral compartment of the knee joint was evaluated. She had some mild fraying of the lateral tibial plateau.  A small tear of the posterior curve of the lateral meniscus was  noted.  It was trimmed back with baskets and shaved down until smooth with the 3.5 shaver.  The remainder of the lateral meniscus was normal on probing.  Then, looking up in the lateral gutter and suprapatellar area, the mild wear of the patella was noted and, as discussed above, no surgical treatment was required.  I then reversed portals. She had a good bit of synovitis as well as extensive wear over most of the weightbearing surface of the medial femoral condyle, down basically close to bone in many areas.  This was all shaved down gently with the 3.5 shaver and with a rasp until smooth.  The torn medial meniscus encompassed the entire portion from the junction of the anterior and mid third all the way into the intercondylar area.  This was trimmed back to a stable rim with a variety of baskets and shaved down until smooth with the 3.5 shaver, leaving very little rim remaining in most areas because of the extent of the tear.  The knee joint was then irrigated until clear and all fluid possible removed.  The two anterior portals were closed with 4-0 nylon.  Then, 20 cc 0.5% Marcaine with adrenalin was then injected in the knee joint.  The inflow apparatus removed and this portal closed with 4-0 nylon as well.  Betadine and Adaptic dry sterile dressing were applied.  Tourniquet was released.  She tolerated the procedure well and was taken to the recovery room in satisfactory condition with no known complications. Attending Physician:  Joaquin Courts DD:  06/03/01 TD:  06/03/01 Job: [id number]

## 2011-02-07 NOTE — Discharge Summary (Signed)
[name], [name]             ACCOUNT NO.:  [id number]   MEDICAL RECORD NO.:  [id number]          PATIENT TYPE:  INP   LOCATION:  1602                         FACILITY:  [facility]   PHYSICIAN:  Marlowe Kays, M.D.  DATE OF BIRTH:  September 04, 1941   DATE OF ADMISSION:  11/16/2008  DATE OF DISCHARGE:  11/20/2008                               DISCHARGE SUMMARY   ADMITTING DIAGNOSES:  1. Osteoarthritis of left knee, end stage.  2. Hypertension.  3. Dyslipidemia.  4. Reflux.  5. Sleep apnea.  6. Hypothyroidism.   DISCHARGE DIAGNOSES:  1. Osteoarthritis of left knee, end stage.  2. Hypertension.  3. Dyslipidemia.  4. Reflux.  5. Sleep apnea.  6. Hypothyroidism.  7. Acute blood loss anemia.   OPERATION:  On November 16, 2008, the patient underwent [employer] total  knee replacement arthroplasty of the left knee.  [facility], P.A.-  C., assisted.   BRIEF HISTORY:  This 70 year old lady has been seen by [place] for continuing  problems concerning her left knee.  Conservative care has included  corticosteroid injection to her knee which has really not helped her.  X-  ray showed a considerable amount of arthritis into the knee and was  essentially near end stage.  She has successfully undergone right total  knee replacement arthroplasty for the same reasons as in the left knee  and decided to go ahead with left total knee replacement arthroplasty.  After much discussion including the risks and benefits of surgery,  decided she would benefit with the planned surgery and we went ahead  with same.   COURSE IN THE HOSPITAL:  Patient tolerated the surgical procedure quite  well, was placed on Coumadin protocol postoperatively with intervention  of DVT.  PCA was used postoperatively for her range of motion.  Neurovascular remained intact in the operative extremity.  She worked  diligently with physical therapy, doing really quite well, ambulating in  the hall.  Home health was arranged  for her to continue with the rehab  at home.  Once this was in place, she was discharged home.   She did have some mild postoperative anemia but she was asymptomatic.   LABORATORY VALUES IN THE HOSPITAL:  Hematological showed a preoperative  CBC nearly completely within normal limits, hemoglobin was 15.3.  Final  hemoglobin was 10.1, hematocrit was 30.0.  INR was at 2.4 at discharge.  Blood chemistry remained essentially normal.  She did have a slight drop  in her potassium postoperatively but this recovered to 3.1 but this  recovered to 3.6.  X-ray showed satisfactory postoperative knee  prosthesis.  No electrocardiogram or other x-ray seen in this chart.   CONDITION ON DISCHARGE:  Completely improved, stable.  The patient was  discharged to her home under care of her family.  Continue with same the  diet she had at home.   DISCHARGE MEDICATIONS:  1. Demerol for pain.  2. Robaxin as a muscle relaxant.  3. Coumadin for DVT prophylaxis.   OTHER MEDICATION:  1. Trazodone 50 mg h.s.  2. Gemfibrozil 600 mg b.i.d.  3. Omeprazole 40 mg  q.a.m.  4. L-Thyroxine 0.125 mg q.a.m.  5. Bisoprolol fumarate/hydrochlorothiazide 25/6.25 q.a.m.  6. Furosemide 20 mg q.a.m.  7. Zyrtec 10 mg q.a.m.  8. Tussionex 5 mL b.i.d.  9. Alprazolam 0.5 mg h.s.  10.Lexapro 10 mg h.s.  11.Zetia 10 mg h.s.  12.Lipitor 20 mg h.s.  13.Ropinirole 0.5 mg h.s.  14.Patanase nasal spray 1 spray each nostril h.s.  15.Fluticasone 1 spray each nostril at night.   She is to use dry dressing as needed since her wound was clean and dry  at the time of discharge.  Return to see [place] approximately 2 weeks after  date of surgery.  She is encouraged to call should she have any problems  or questions during the interim before coming to the office.  End of  that discharge summary.      Dooley L. Cherlynn June.    ______________________________  Marlowe Kays, M.D.    DLU/MEDQ  D:  12/05/2008  T:  12/05/2008  Job:   [id number]   cc:   Jeanella Flattery, F.N.P.   Beverely Risen, M.D.

## 2011-03-12 ENCOUNTER — Ambulatory Visit: Payer: Self-pay | Admitting: Pain Medicine

## 2011-03-18 ENCOUNTER — Ambulatory Visit: Payer: Self-pay | Admitting: Pain Medicine

## 2011-04-07 ENCOUNTER — Ambulatory Visit: Payer: Self-pay | Admitting: Pain Medicine

## 2011-04-23 ENCOUNTER — Ambulatory Visit: Payer: Self-pay | Admitting: Pain Medicine

## 2011-05-15 ENCOUNTER — Ambulatory Visit: Payer: Self-pay | Admitting: Pain Medicine

## 2011-05-21 ENCOUNTER — Ambulatory Visit: Payer: Self-pay | Admitting: Pain Medicine

## 2011-05-29 ENCOUNTER — Ambulatory Visit: Payer: Self-pay | Admitting: Pain Medicine

## 2011-05-30 ENCOUNTER — Ambulatory Visit: Payer: Self-pay | Admitting: Pain Medicine

## 2011-06-09 ENCOUNTER — Ambulatory Visit: Payer: Self-pay | Admitting: Pain Medicine

## 2011-06-23 LAB — POCT I-STAT 4, (NA,K, GLUC, HGB,HCT)
Glucose, Bld: 103 — ABNORMAL HIGH
HCT: 46
Sodium: 140

## 2011-07-07 LAB — BASIC METABOLIC PANEL
BUN: 8
Chloride: 104
Glucose, Bld: 137 — ABNORMAL HIGH
Potassium: 4.3

## 2011-07-07 LAB — CBC
HCT: 45.7
Hemoglobin: 16.1 — ABNORMAL HIGH
MCV: 87.9
Platelets: 199
WBC: 8.5

## 2011-07-07 LAB — ABO/RH: ABO/RH(D): A POS

## 2011-09-26 DIAGNOSIS — D0470 Carcinoma in situ of skin of unspecified lower limb, including hip: Secondary | ICD-10-CM | POA: Diagnosis not present

## 2011-09-26 DIAGNOSIS — L723 Sebaceous cyst: Secondary | ICD-10-CM | POA: Diagnosis not present

## 2011-10-08 ENCOUNTER — Ambulatory Visit: Payer: Self-pay | Admitting: Internal Medicine

## 2011-10-08 DIAGNOSIS — Z1231 Encounter for screening mammogram for malignant neoplasm of breast: Secondary | ICD-10-CM | POA: Diagnosis not present

## 2011-10-22 DIAGNOSIS — G4710 Hypersomnia, unspecified: Secondary | ICD-10-CM | POA: Diagnosis not present

## 2011-10-22 DIAGNOSIS — G4730 Sleep apnea, unspecified: Secondary | ICD-10-CM | POA: Diagnosis not present

## 2011-10-31 DIAGNOSIS — J301 Allergic rhinitis due to pollen: Secondary | ICD-10-CM | POA: Diagnosis not present

## 2011-10-31 DIAGNOSIS — E782 Mixed hyperlipidemia: Secondary | ICD-10-CM | POA: Diagnosis not present

## 2011-10-31 DIAGNOSIS — E119 Type 2 diabetes mellitus without complications: Secondary | ICD-10-CM | POA: Diagnosis not present

## 2011-10-31 DIAGNOSIS — E039 Hypothyroidism, unspecified: Secondary | ICD-10-CM | POA: Diagnosis not present

## 2011-10-31 DIAGNOSIS — Z0000 Encounter for general adult medical examination without abnormal findings: Secondary | ICD-10-CM | POA: Diagnosis not present

## 2011-10-31 DIAGNOSIS — F411 Generalized anxiety disorder: Secondary | ICD-10-CM | POA: Diagnosis not present

## 2011-11-03 DIAGNOSIS — M25569 Pain in unspecified knee: Secondary | ICD-10-CM | POA: Diagnosis not present

## 2011-11-03 DIAGNOSIS — M5430 Sciatica, unspecified side: Secondary | ICD-10-CM | POA: Diagnosis not present

## 2011-11-03 DIAGNOSIS — M5137 Other intervertebral disc degeneration, lumbosacral region: Secondary | ICD-10-CM | POA: Diagnosis not present

## 2011-11-03 DIAGNOSIS — M48061 Spinal stenosis, lumbar region without neurogenic claudication: Secondary | ICD-10-CM | POA: Diagnosis not present

## 2011-11-03 DIAGNOSIS — M47817 Spondylosis without myelopathy or radiculopathy, lumbosacral region: Secondary | ICD-10-CM | POA: Diagnosis not present

## 2011-11-03 DIAGNOSIS — J449 Chronic obstructive pulmonary disease, unspecified: Secondary | ICD-10-CM | POA: Diagnosis not present

## 2011-11-03 DIAGNOSIS — M545 Low back pain, unspecified: Secondary | ICD-10-CM | POA: Diagnosis not present

## 2011-11-03 DIAGNOSIS — F411 Generalized anxiety disorder: Secondary | ICD-10-CM | POA: Diagnosis not present

## 2011-12-29 DIAGNOSIS — M5137 Other intervertebral disc degeneration, lumbosacral region: Secondary | ICD-10-CM | POA: Diagnosis not present

## 2011-12-29 DIAGNOSIS — M545 Low back pain, unspecified: Secondary | ICD-10-CM | POA: Diagnosis not present

## 2011-12-29 DIAGNOSIS — M47817 Spondylosis without myelopathy or radiculopathy, lumbosacral region: Secondary | ICD-10-CM | POA: Diagnosis not present

## 2011-12-29 DIAGNOSIS — J449 Chronic obstructive pulmonary disease, unspecified: Secondary | ICD-10-CM | POA: Diagnosis not present

## 2011-12-29 DIAGNOSIS — F411 Generalized anxiety disorder: Secondary | ICD-10-CM | POA: Diagnosis not present

## 2011-12-29 DIAGNOSIS — M25569 Pain in unspecified knee: Secondary | ICD-10-CM | POA: Diagnosis not present

## 2011-12-29 DIAGNOSIS — M48061 Spinal stenosis, lumbar region without neurogenic claudication: Secondary | ICD-10-CM | POA: Diagnosis not present

## 2012-01-13 DIAGNOSIS — M549 Dorsalgia, unspecified: Secondary | ICD-10-CM | POA: Diagnosis not present

## 2012-01-13 DIAGNOSIS — F411 Generalized anxiety disorder: Secondary | ICD-10-CM | POA: Diagnosis not present

## 2012-01-13 DIAGNOSIS — I10 Essential (primary) hypertension: Secondary | ICD-10-CM | POA: Diagnosis not present

## 2012-01-13 DIAGNOSIS — F329 Major depressive disorder, single episode, unspecified: Secondary | ICD-10-CM | POA: Diagnosis not present

## 2012-01-13 DIAGNOSIS — F17200 Nicotine dependence, unspecified, uncomplicated: Secondary | ICD-10-CM | POA: Diagnosis not present

## 2012-01-13 DIAGNOSIS — E782 Mixed hyperlipidemia: Secondary | ICD-10-CM | POA: Diagnosis not present

## 2012-01-13 DIAGNOSIS — F5102 Adjustment insomnia: Secondary | ICD-10-CM | POA: Diagnosis not present

## 2012-01-13 DIAGNOSIS — E039 Hypothyroidism, unspecified: Secondary | ICD-10-CM | POA: Diagnosis not present

## 2012-01-28 DIAGNOSIS — G4710 Hypersomnia, unspecified: Secondary | ICD-10-CM | POA: Diagnosis not present

## 2012-01-28 DIAGNOSIS — G4730 Sleep apnea, unspecified: Secondary | ICD-10-CM | POA: Diagnosis not present

## 2012-02-23 DIAGNOSIS — M545 Low back pain, unspecified: Secondary | ICD-10-CM | POA: Diagnosis not present

## 2012-02-23 DIAGNOSIS — J449 Chronic obstructive pulmonary disease, unspecified: Secondary | ICD-10-CM | POA: Diagnosis not present

## 2012-02-23 DIAGNOSIS — M47817 Spondylosis without myelopathy or radiculopathy, lumbosacral region: Secondary | ICD-10-CM | POA: Diagnosis not present

## 2012-02-23 DIAGNOSIS — M25569 Pain in unspecified knee: Secondary | ICD-10-CM | POA: Diagnosis not present

## 2012-02-23 DIAGNOSIS — M48061 Spinal stenosis, lumbar region without neurogenic claudication: Secondary | ICD-10-CM | POA: Diagnosis not present

## 2012-02-23 DIAGNOSIS — M5137 Other intervertebral disc degeneration, lumbosacral region: Secondary | ICD-10-CM | POA: Diagnosis not present

## 2012-02-23 DIAGNOSIS — F411 Generalized anxiety disorder: Secondary | ICD-10-CM | POA: Diagnosis not present

## 2012-03-01 DIAGNOSIS — M48061 Spinal stenosis, lumbar region without neurogenic claudication: Secondary | ICD-10-CM | POA: Diagnosis not present

## 2012-03-01 DIAGNOSIS — M5137 Other intervertebral disc degeneration, lumbosacral region: Secondary | ICD-10-CM | POA: Diagnosis not present

## 2012-03-01 DIAGNOSIS — M47817 Spondylosis without myelopathy or radiculopathy, lumbosacral region: Secondary | ICD-10-CM | POA: Diagnosis not present

## 2012-03-01 DIAGNOSIS — M545 Low back pain, unspecified: Secondary | ICD-10-CM | POA: Diagnosis not present

## 2012-03-01 DIAGNOSIS — M5430 Sciatica, unspecified side: Secondary | ICD-10-CM | POA: Diagnosis not present

## 2012-04-15 DIAGNOSIS — D649 Anemia, unspecified: Secondary | ICD-10-CM | POA: Diagnosis not present

## 2012-04-15 DIAGNOSIS — Z79899 Other long term (current) drug therapy: Secondary | ICD-10-CM | POA: Diagnosis not present

## 2012-04-15 DIAGNOSIS — E782 Mixed hyperlipidemia: Secondary | ICD-10-CM | POA: Diagnosis not present

## 2012-04-15 DIAGNOSIS — E119 Type 2 diabetes mellitus without complications: Secondary | ICD-10-CM | POA: Diagnosis not present

## 2012-04-15 DIAGNOSIS — E669 Obesity, unspecified: Secondary | ICD-10-CM | POA: Diagnosis not present

## 2012-04-15 DIAGNOSIS — F411 Generalized anxiety disorder: Secondary | ICD-10-CM | POA: Diagnosis not present

## 2012-04-15 DIAGNOSIS — I10 Essential (primary) hypertension: Secondary | ICD-10-CM | POA: Diagnosis not present

## 2012-04-15 DIAGNOSIS — E039 Hypothyroidism, unspecified: Secondary | ICD-10-CM | POA: Diagnosis not present

## 2012-05-03 DIAGNOSIS — J449 Chronic obstructive pulmonary disease, unspecified: Secondary | ICD-10-CM | POA: Diagnosis not present

## 2012-05-03 DIAGNOSIS — M25569 Pain in unspecified knee: Secondary | ICD-10-CM | POA: Diagnosis not present

## 2012-05-03 DIAGNOSIS — L03039 Cellulitis of unspecified toe: Secondary | ICD-10-CM | POA: Diagnosis not present

## 2012-05-03 DIAGNOSIS — F411 Generalized anxiety disorder: Secondary | ICD-10-CM | POA: Diagnosis not present

## 2012-05-03 DIAGNOSIS — M545 Low back pain, unspecified: Secondary | ICD-10-CM | POA: Diagnosis not present

## 2012-05-03 DIAGNOSIS — M48061 Spinal stenosis, lumbar region without neurogenic claudication: Secondary | ICD-10-CM | POA: Diagnosis not present

## 2012-05-03 DIAGNOSIS — L600 Ingrowing nail: Secondary | ICD-10-CM | POA: Diagnosis not present

## 2012-05-03 DIAGNOSIS — M5137 Other intervertebral disc degeneration, lumbosacral region: Secondary | ICD-10-CM | POA: Diagnosis not present

## 2012-05-03 DIAGNOSIS — M47817 Spondylosis without myelopathy or radiculopathy, lumbosacral region: Secondary | ICD-10-CM | POA: Diagnosis not present

## 2012-05-15 DIAGNOSIS — Z23 Encounter for immunization: Secondary | ICD-10-CM | POA: Diagnosis not present

## 2012-06-09 DIAGNOSIS — G4710 Hypersomnia, unspecified: Secondary | ICD-10-CM | POA: Diagnosis not present

## 2012-06-09 DIAGNOSIS — G4730 Sleep apnea, unspecified: Secondary | ICD-10-CM | POA: Diagnosis not present

## 2012-06-14 DIAGNOSIS — J019 Acute sinusitis, unspecified: Secondary | ICD-10-CM | POA: Diagnosis not present

## 2012-06-14 DIAGNOSIS — I10 Essential (primary) hypertension: Secondary | ICD-10-CM | POA: Diagnosis not present

## 2012-06-14 DIAGNOSIS — J309 Allergic rhinitis, unspecified: Secondary | ICD-10-CM | POA: Diagnosis not present

## 2012-06-16 DIAGNOSIS — J209 Acute bronchitis, unspecified: Secondary | ICD-10-CM | POA: Diagnosis not present

## 2012-06-16 DIAGNOSIS — J019 Acute sinusitis, unspecified: Secondary | ICD-10-CM | POA: Diagnosis not present

## 2012-06-16 DIAGNOSIS — J309 Allergic rhinitis, unspecified: Secondary | ICD-10-CM | POA: Diagnosis not present

## 2012-06-16 DIAGNOSIS — H6120 Impacted cerumen, unspecified ear: Secondary | ICD-10-CM | POA: Diagnosis not present

## 2012-06-28 DIAGNOSIS — M48061 Spinal stenosis, lumbar region without neurogenic claudication: Secondary | ICD-10-CM | POA: Diagnosis not present

## 2012-06-28 DIAGNOSIS — Z79899 Other long term (current) drug therapy: Secondary | ICD-10-CM | POA: Diagnosis not present

## 2012-06-28 DIAGNOSIS — F411 Generalized anxiety disorder: Secondary | ICD-10-CM | POA: Diagnosis not present

## 2012-06-28 DIAGNOSIS — M5137 Other intervertebral disc degeneration, lumbosacral region: Secondary | ICD-10-CM | POA: Diagnosis not present

## 2012-06-28 DIAGNOSIS — J449 Chronic obstructive pulmonary disease, unspecified: Secondary | ICD-10-CM | POA: Diagnosis not present

## 2012-06-28 DIAGNOSIS — M25569 Pain in unspecified knee: Secondary | ICD-10-CM | POA: Diagnosis not present

## 2012-06-28 DIAGNOSIS — M545 Low back pain, unspecified: Secondary | ICD-10-CM | POA: Diagnosis not present

## 2012-06-28 DIAGNOSIS — M47817 Spondylosis without myelopathy or radiculopathy, lumbosacral region: Secondary | ICD-10-CM | POA: Diagnosis not present

## 2012-07-26 DIAGNOSIS — M545 Low back pain, unspecified: Secondary | ICD-10-CM | POA: Diagnosis not present

## 2012-07-26 DIAGNOSIS — M48061 Spinal stenosis, lumbar region without neurogenic claudication: Secondary | ICD-10-CM | POA: Diagnosis not present

## 2012-07-26 DIAGNOSIS — J449 Chronic obstructive pulmonary disease, unspecified: Secondary | ICD-10-CM | POA: Diagnosis not present

## 2012-07-26 DIAGNOSIS — M25569 Pain in unspecified knee: Secondary | ICD-10-CM | POA: Diagnosis not present

## 2012-07-26 DIAGNOSIS — M5137 Other intervertebral disc degeneration, lumbosacral region: Secondary | ICD-10-CM | POA: Diagnosis not present

## 2012-07-26 DIAGNOSIS — M47817 Spondylosis without myelopathy or radiculopathy, lumbosacral region: Secondary | ICD-10-CM | POA: Diagnosis not present

## 2012-07-26 DIAGNOSIS — F411 Generalized anxiety disorder: Secondary | ICD-10-CM | POA: Diagnosis not present

## 2012-08-10 DIAGNOSIS — F411 Generalized anxiety disorder: Secondary | ICD-10-CM | POA: Diagnosis not present

## 2012-08-10 DIAGNOSIS — R609 Edema, unspecified: Secondary | ICD-10-CM | POA: Diagnosis not present

## 2012-08-10 DIAGNOSIS — K219 Gastro-esophageal reflux disease without esophagitis: Secondary | ICD-10-CM | POA: Diagnosis not present

## 2012-08-10 DIAGNOSIS — M159 Polyosteoarthritis, unspecified: Secondary | ICD-10-CM | POA: Diagnosis not present

## 2012-08-10 DIAGNOSIS — E039 Hypothyroidism, unspecified: Secondary | ICD-10-CM | POA: Diagnosis not present

## 2012-08-26 ENCOUNTER — Emergency Department: Payer: Self-pay | Admitting: Emergency Medicine

## 2012-08-26 DIAGNOSIS — I10 Essential (primary) hypertension: Secondary | ICD-10-CM | POA: Diagnosis not present

## 2012-08-26 DIAGNOSIS — Z7982 Long term (current) use of aspirin: Secondary | ICD-10-CM | POA: Diagnosis not present

## 2012-08-26 DIAGNOSIS — Z79899 Other long term (current) drug therapy: Secondary | ICD-10-CM | POA: Diagnosis not present

## 2012-08-26 DIAGNOSIS — R51 Headache: Secondary | ICD-10-CM | POA: Diagnosis not present

## 2012-08-26 DIAGNOSIS — G4730 Sleep apnea, unspecified: Secondary | ICD-10-CM | POA: Diagnosis not present

## 2012-08-26 DIAGNOSIS — Z9889 Other specified postprocedural states: Secondary | ICD-10-CM | POA: Diagnosis not present

## 2012-08-26 DIAGNOSIS — M129 Arthropathy, unspecified: Secondary | ICD-10-CM | POA: Diagnosis not present

## 2012-08-26 DIAGNOSIS — R079 Chest pain, unspecified: Secondary | ICD-10-CM | POA: Diagnosis not present

## 2012-08-26 LAB — COMPREHENSIVE METABOLIC PANEL
Albumin: 4.3 g/dL (ref 3.4–5.0)
Alkaline Phosphatase: 89 U/L (ref 50–136)
BUN: 13 mg/dL (ref 7–18)
Bilirubin,Total: 0.6 mg/dL (ref 0.2–1.0)
Chloride: 105 mmol/L (ref 98–107)
Co2: 29 mmol/L (ref 21–32)
Creatinine: 0.74 mg/dL (ref 0.60–1.30)
Glucose: 97 mg/dL (ref 65–99)
Osmolality: 278 (ref 275–301)
Potassium: 3.3 mmol/L — ABNORMAL LOW (ref 3.5–5.1)
SGPT (ALT): 30 U/L (ref 12–78)
Sodium: 139 mmol/L (ref 136–145)
Total Protein: 7.9 g/dL (ref 6.4–8.2)

## 2012-08-26 LAB — CBC
HCT: 47.3 % — ABNORMAL HIGH (ref 35.0–47.0)
HGB: 16.7 g/dL — ABNORMAL HIGH (ref 12.0–16.0)
MCHC: 35.3 g/dL (ref 32.0–36.0)
MCV: 92 fL (ref 80–100)
Platelet: 134 10*3/uL — ABNORMAL LOW (ref 150–440)
WBC: 9 10*3/uL (ref 3.6–11.0)

## 2012-08-26 LAB — TROPONIN I: Troponin-I: <0.02 ng/mL

## 2012-08-27 LAB — TROPONIN I: Troponin-I: <0.02 ng/mL

## 2012-08-31 DIAGNOSIS — R609 Edema, unspecified: Secondary | ICD-10-CM | POA: Diagnosis not present

## 2012-08-31 DIAGNOSIS — I10 Essential (primary) hypertension: Secondary | ICD-10-CM | POA: Diagnosis not present

## 2012-08-31 DIAGNOSIS — E039 Hypothyroidism, unspecified: Secondary | ICD-10-CM | POA: Diagnosis not present

## 2012-08-31 DIAGNOSIS — M159 Polyosteoarthritis, unspecified: Secondary | ICD-10-CM | POA: Diagnosis not present

## 2012-09-27 DIAGNOSIS — M47817 Spondylosis without myelopathy or radiculopathy, lumbosacral region: Secondary | ICD-10-CM | POA: Diagnosis not present

## 2012-09-27 DIAGNOSIS — M25569 Pain in unspecified knee: Secondary | ICD-10-CM | POA: Diagnosis not present

## 2012-09-27 DIAGNOSIS — M48061 Spinal stenosis, lumbar region without neurogenic claudication: Secondary | ICD-10-CM | POA: Diagnosis not present

## 2012-09-27 DIAGNOSIS — M5137 Other intervertebral disc degeneration, lumbosacral region: Secondary | ICD-10-CM | POA: Diagnosis not present

## 2012-09-27 DIAGNOSIS — J449 Chronic obstructive pulmonary disease, unspecified: Secondary | ICD-10-CM | POA: Diagnosis not present

## 2012-09-27 DIAGNOSIS — F411 Generalized anxiety disorder: Secondary | ICD-10-CM | POA: Diagnosis not present

## 2012-09-27 DIAGNOSIS — M545 Low back pain, unspecified: Secondary | ICD-10-CM | POA: Diagnosis not present

## 2012-10-01 DIAGNOSIS — R0602 Shortness of breath: Secondary | ICD-10-CM | POA: Diagnosis not present

## 2012-10-01 DIAGNOSIS — R079 Chest pain, unspecified: Secondary | ICD-10-CM | POA: Diagnosis not present

## 2012-10-01 DIAGNOSIS — I10 Essential (primary) hypertension: Secondary | ICD-10-CM | POA: Diagnosis not present

## 2012-10-07 DIAGNOSIS — E782 Mixed hyperlipidemia: Secondary | ICD-10-CM | POA: Diagnosis not present

## 2012-10-07 DIAGNOSIS — I10 Essential (primary) hypertension: Secondary | ICD-10-CM | POA: Diagnosis not present

## 2012-10-07 DIAGNOSIS — F411 Generalized anxiety disorder: Secondary | ICD-10-CM | POA: Diagnosis not present

## 2012-10-07 DIAGNOSIS — J309 Allergic rhinitis, unspecified: Secondary | ICD-10-CM | POA: Diagnosis not present

## 2012-10-07 DIAGNOSIS — M47817 Spondylosis without myelopathy or radiculopathy, lumbosacral region: Secondary | ICD-10-CM | POA: Diagnosis not present

## 2012-10-07 DIAGNOSIS — E039 Hypothyroidism, unspecified: Secondary | ICD-10-CM | POA: Diagnosis not present

## 2012-10-13 ENCOUNTER — Ambulatory Visit: Payer: Self-pay | Admitting: Internal Medicine

## 2012-10-13 DIAGNOSIS — L600 Ingrowing nail: Secondary | ICD-10-CM | POA: Diagnosis not present

## 2012-10-13 DIAGNOSIS — Z1231 Encounter for screening mammogram for malignant neoplasm of breast: Secondary | ICD-10-CM | POA: Diagnosis not present

## 2012-10-13 DIAGNOSIS — L02619 Cutaneous abscess of unspecified foot: Secondary | ICD-10-CM | POA: Diagnosis not present

## 2012-11-22 DIAGNOSIS — F411 Generalized anxiety disorder: Secondary | ICD-10-CM | POA: Diagnosis not present

## 2012-11-22 DIAGNOSIS — M25569 Pain in unspecified knee: Secondary | ICD-10-CM | POA: Diagnosis not present

## 2012-11-22 DIAGNOSIS — M545 Low back pain, unspecified: Secondary | ICD-10-CM | POA: Diagnosis not present

## 2012-11-22 DIAGNOSIS — M5137 Other intervertebral disc degeneration, lumbosacral region: Secondary | ICD-10-CM | POA: Diagnosis not present

## 2012-11-22 DIAGNOSIS — M48061 Spinal stenosis, lumbar region without neurogenic claudication: Secondary | ICD-10-CM | POA: Diagnosis not present

## 2012-11-22 DIAGNOSIS — J449 Chronic obstructive pulmonary disease, unspecified: Secondary | ICD-10-CM | POA: Diagnosis not present

## 2012-11-22 DIAGNOSIS — M47817 Spondylosis without myelopathy or radiculopathy, lumbosacral region: Secondary | ICD-10-CM | POA: Diagnosis not present

## 2013-01-11 DIAGNOSIS — H6690 Otitis media, unspecified, unspecified ear: Secondary | ICD-10-CM | POA: Diagnosis not present

## 2013-01-11 DIAGNOSIS — G4710 Hypersomnia, unspecified: Secondary | ICD-10-CM | POA: Diagnosis not present

## 2013-01-11 DIAGNOSIS — J301 Allergic rhinitis due to pollen: Secondary | ICD-10-CM | POA: Diagnosis not present

## 2013-01-17 DIAGNOSIS — M48061 Spinal stenosis, lumbar region without neurogenic claudication: Secondary | ICD-10-CM | POA: Diagnosis not present

## 2013-01-17 DIAGNOSIS — M25569 Pain in unspecified knee: Secondary | ICD-10-CM | POA: Diagnosis not present

## 2013-01-17 DIAGNOSIS — M545 Low back pain, unspecified: Secondary | ICD-10-CM | POA: Diagnosis not present

## 2013-01-17 DIAGNOSIS — M5137 Other intervertebral disc degeneration, lumbosacral region: Secondary | ICD-10-CM | POA: Diagnosis not present

## 2013-01-17 DIAGNOSIS — F411 Generalized anxiety disorder: Secondary | ICD-10-CM | POA: Diagnosis not present

## 2013-01-17 DIAGNOSIS — J449 Chronic obstructive pulmonary disease, unspecified: Secondary | ICD-10-CM | POA: Diagnosis not present

## 2013-01-17 DIAGNOSIS — M47817 Spondylosis without myelopathy or radiculopathy, lumbosacral region: Secondary | ICD-10-CM | POA: Diagnosis not present

## 2013-02-01 DIAGNOSIS — J309 Allergic rhinitis, unspecified: Secondary | ICD-10-CM | POA: Diagnosis not present

## 2013-02-09 DIAGNOSIS — G4710 Hypersomnia, unspecified: Secondary | ICD-10-CM | POA: Diagnosis not present

## 2013-03-21 DIAGNOSIS — F411 Generalized anxiety disorder: Secondary | ICD-10-CM | POA: Diagnosis not present

## 2013-03-21 DIAGNOSIS — M48061 Spinal stenosis, lumbar region without neurogenic claudication: Secondary | ICD-10-CM | POA: Diagnosis not present

## 2013-03-21 DIAGNOSIS — M545 Low back pain, unspecified: Secondary | ICD-10-CM | POA: Diagnosis not present

## 2013-03-21 DIAGNOSIS — M5137 Other intervertebral disc degeneration, lumbosacral region: Secondary | ICD-10-CM | POA: Diagnosis not present

## 2013-03-21 DIAGNOSIS — J449 Chronic obstructive pulmonary disease, unspecified: Secondary | ICD-10-CM | POA: Diagnosis not present

## 2013-03-21 DIAGNOSIS — M47817 Spondylosis without myelopathy or radiculopathy, lumbosacral region: Secondary | ICD-10-CM | POA: Diagnosis not present

## 2013-03-21 DIAGNOSIS — M25569 Pain in unspecified knee: Secondary | ICD-10-CM | POA: Diagnosis not present

## 2013-04-07 DIAGNOSIS — H2510 Age-related nuclear cataract, unspecified eye: Secondary | ICD-10-CM | POA: Diagnosis not present

## 2013-04-25 DIAGNOSIS — I10 Essential (primary) hypertension: Secondary | ICD-10-CM | POA: Diagnosis not present

## 2013-04-25 DIAGNOSIS — E039 Hypothyroidism, unspecified: Secondary | ICD-10-CM | POA: Diagnosis not present

## 2013-04-25 DIAGNOSIS — M159 Polyosteoarthritis, unspecified: Secondary | ICD-10-CM | POA: Diagnosis not present

## 2013-04-25 DIAGNOSIS — M545 Low back pain, unspecified: Secondary | ICD-10-CM | POA: Diagnosis not present

## 2013-04-25 DIAGNOSIS — E782 Mixed hyperlipidemia: Secondary | ICD-10-CM | POA: Diagnosis not present

## 2013-04-25 DIAGNOSIS — D518 Other vitamin B12 deficiency anemias: Secondary | ICD-10-CM | POA: Diagnosis not present

## 2013-04-25 DIAGNOSIS — E038 Other specified hypothyroidism: Secondary | ICD-10-CM | POA: Diagnosis not present

## 2013-04-25 DIAGNOSIS — Z79899 Other long term (current) drug therapy: Secondary | ICD-10-CM | POA: Diagnosis not present

## 2013-04-25 DIAGNOSIS — Z0000 Encounter for general adult medical examination without abnormal findings: Secondary | ICD-10-CM | POA: Diagnosis not present

## 2013-04-25 DIAGNOSIS — M48061 Spinal stenosis, lumbar region without neurogenic claudication: Secondary | ICD-10-CM | POA: Diagnosis not present

## 2013-04-25 DIAGNOSIS — F411 Generalized anxiety disorder: Secondary | ICD-10-CM | POA: Diagnosis not present

## 2013-04-25 DIAGNOSIS — E559 Vitamin D deficiency, unspecified: Secondary | ICD-10-CM | POA: Diagnosis not present

## 2013-04-25 DIAGNOSIS — G479 Sleep disorder, unspecified: Secondary | ICD-10-CM | POA: Diagnosis not present

## 2013-04-25 DIAGNOSIS — Z006 Encounter for examination for normal comparison and control in clinical research program: Secondary | ICD-10-CM | POA: Diagnosis not present

## 2013-05-04 DIAGNOSIS — G4710 Hypersomnia, unspecified: Secondary | ICD-10-CM | POA: Diagnosis not present

## 2013-05-04 DIAGNOSIS — G4730 Sleep apnea, unspecified: Secondary | ICD-10-CM | POA: Diagnosis not present

## 2013-05-16 DIAGNOSIS — M47817 Spondylosis without myelopathy or radiculopathy, lumbosacral region: Secondary | ICD-10-CM | POA: Diagnosis not present

## 2013-05-16 DIAGNOSIS — M25569 Pain in unspecified knee: Secondary | ICD-10-CM | POA: Diagnosis not present

## 2013-05-16 DIAGNOSIS — F411 Generalized anxiety disorder: Secondary | ICD-10-CM | POA: Diagnosis not present

## 2013-05-16 DIAGNOSIS — M48061 Spinal stenosis, lumbar region without neurogenic claudication: Secondary | ICD-10-CM | POA: Diagnosis not present

## 2013-05-16 DIAGNOSIS — J449 Chronic obstructive pulmonary disease, unspecified: Secondary | ICD-10-CM | POA: Diagnosis not present

## 2013-05-16 DIAGNOSIS — M5137 Other intervertebral disc degeneration, lumbosacral region: Secondary | ICD-10-CM | POA: Diagnosis not present

## 2013-05-16 DIAGNOSIS — M545 Low back pain, unspecified: Secondary | ICD-10-CM | POA: Diagnosis not present

## 2013-05-19 ENCOUNTER — Other Ambulatory Visit: Payer: Self-pay | Admitting: Anesthesiology

## 2013-05-19 DIAGNOSIS — M545 Low back pain, unspecified: Secondary | ICD-10-CM

## 2013-05-27 DIAGNOSIS — M5137 Other intervertebral disc degeneration, lumbosacral region: Secondary | ICD-10-CM | POA: Diagnosis not present

## 2013-05-27 DIAGNOSIS — M961 Postlaminectomy syndrome, not elsewhere classified: Secondary | ICD-10-CM | POA: Diagnosis not present

## 2013-05-30 ENCOUNTER — Ambulatory Visit
Admission: RE | Admit: 2013-05-30 | Discharge: 2013-05-30 | Disposition: A | Discharge status: Home / Self Care | Payer: Medicare Other | Source: Ambulatory Visit | Attending: Anesthesiology | Admitting: Anesthesiology

## 2013-05-30 DIAGNOSIS — M545 Low back pain, unspecified: Secondary | ICD-10-CM

## 2013-06-06 ENCOUNTER — Other Ambulatory Visit: Payer: Self-pay | Admitting: Orthopedic Surgery

## 2013-06-06 DIAGNOSIS — M961 Postlaminectomy syndrome, not elsewhere classified: Secondary | ICD-10-CM

## 2013-06-08 ENCOUNTER — Ambulatory Visit
Admission: RE | Admit: 2013-06-08 | Discharge: 2013-06-08 | Disposition: A | Discharge status: Home / Self Care | Payer: No Typology Code available for payment source | Source: Ambulatory Visit | Attending: Orthopedic Surgery | Admitting: Orthopedic Surgery

## 2013-06-08 DIAGNOSIS — M961 Postlaminectomy syndrome, not elsewhere classified: Secondary | ICD-10-CM

## 2013-06-08 DIAGNOSIS — M47817 Spondylosis without myelopathy or radiculopathy, lumbosacral region: Secondary | ICD-10-CM | POA: Diagnosis not present

## 2013-06-08 DIAGNOSIS — M48061 Spinal stenosis, lumbar region without neurogenic claudication: Secondary | ICD-10-CM | POA: Diagnosis not present

## 2013-06-08 MED ORDER — IOHEXOL 300 MG/ML  SOLN
100.0000 mL | Freq: Once | INTRAMUSCULAR | Status: AC | PRN
  Administered 2013-06-08: 100 mL via INTRAVENOUS

## 2013-06-13 DIAGNOSIS — Z23 Encounter for immunization: Secondary | ICD-10-CM | POA: Diagnosis not present

## 2013-06-13 DIAGNOSIS — M25569 Pain in unspecified knee: Secondary | ICD-10-CM | POA: Diagnosis not present

## 2013-06-13 DIAGNOSIS — M545 Low back pain, unspecified: Secondary | ICD-10-CM | POA: Diagnosis not present

## 2013-06-13 DIAGNOSIS — J449 Chronic obstructive pulmonary disease, unspecified: Secondary | ICD-10-CM | POA: Diagnosis not present

## 2013-06-13 DIAGNOSIS — M5137 Other intervertebral disc degeneration, lumbosacral region: Secondary | ICD-10-CM | POA: Diagnosis not present

## 2013-06-13 DIAGNOSIS — M47817 Spondylosis without myelopathy or radiculopathy, lumbosacral region: Secondary | ICD-10-CM | POA: Diagnosis not present

## 2013-06-13 DIAGNOSIS — M48061 Spinal stenosis, lumbar region without neurogenic claudication: Secondary | ICD-10-CM | POA: Diagnosis not present

## 2013-06-13 DIAGNOSIS — F411 Generalized anxiety disorder: Secondary | ICD-10-CM | POA: Diagnosis not present

## 2013-06-24 DIAGNOSIS — M549 Dorsalgia, unspecified: Secondary | ICD-10-CM | POA: Diagnosis not present

## 2013-07-25 DIAGNOSIS — J449 Chronic obstructive pulmonary disease, unspecified: Secondary | ICD-10-CM | POA: Diagnosis not present

## 2013-07-25 DIAGNOSIS — M545 Low back pain, unspecified: Secondary | ICD-10-CM | POA: Diagnosis not present

## 2013-07-25 DIAGNOSIS — M5137 Other intervertebral disc degeneration, lumbosacral region: Secondary | ICD-10-CM | POA: Diagnosis not present

## 2013-07-25 DIAGNOSIS — M25569 Pain in unspecified knee: Secondary | ICD-10-CM | POA: Diagnosis not present

## 2013-07-25 DIAGNOSIS — M47817 Spondylosis without myelopathy or radiculopathy, lumbosacral region: Secondary | ICD-10-CM | POA: Diagnosis not present

## 2013-07-25 DIAGNOSIS — F411 Generalized anxiety disorder: Secondary | ICD-10-CM | POA: Diagnosis not present

## 2013-07-25 DIAGNOSIS — M48061 Spinal stenosis, lumbar region without neurogenic claudication: Secondary | ICD-10-CM | POA: Diagnosis not present

## 2013-07-26 DIAGNOSIS — F5102 Adjustment insomnia: Secondary | ICD-10-CM | POA: Diagnosis not present

## 2013-07-26 DIAGNOSIS — E039 Hypothyroidism, unspecified: Secondary | ICD-10-CM | POA: Diagnosis not present

## 2013-07-26 DIAGNOSIS — F411 Generalized anxiety disorder: Secondary | ICD-10-CM | POA: Diagnosis not present

## 2013-07-26 DIAGNOSIS — I10 Essential (primary) hypertension: Secondary | ICD-10-CM | POA: Diagnosis not present

## 2013-08-03 DIAGNOSIS — G4710 Hypersomnia, unspecified: Secondary | ICD-10-CM | POA: Diagnosis not present

## 2013-08-08 DIAGNOSIS — M545 Low back pain, unspecified: Secondary | ICD-10-CM | POA: Diagnosis not present

## 2013-08-08 DIAGNOSIS — M48061 Spinal stenosis, lumbar region without neurogenic claudication: Secondary | ICD-10-CM | POA: Diagnosis not present

## 2013-08-08 DIAGNOSIS — M25569 Pain in unspecified knee: Secondary | ICD-10-CM | POA: Diagnosis not present

## 2013-08-08 DIAGNOSIS — J449 Chronic obstructive pulmonary disease, unspecified: Secondary | ICD-10-CM | POA: Diagnosis not present

## 2013-08-08 DIAGNOSIS — M5137 Other intervertebral disc degeneration, lumbosacral region: Secondary | ICD-10-CM | POA: Diagnosis not present

## 2013-08-08 DIAGNOSIS — F411 Generalized anxiety disorder: Secondary | ICD-10-CM | POA: Diagnosis not present

## 2013-08-08 DIAGNOSIS — M47817 Spondylosis without myelopathy or radiculopathy, lumbosacral region: Secondary | ICD-10-CM | POA: Diagnosis not present

## 2013-09-01 ENCOUNTER — Ambulatory Visit: Payer: Self-pay | Admitting: Gastroenterology

## 2013-09-01 DIAGNOSIS — E039 Hypothyroidism, unspecified: Secondary | ICD-10-CM | POA: Diagnosis not present

## 2013-09-01 DIAGNOSIS — Z885 Allergy status to narcotic agent status: Secondary | ICD-10-CM | POA: Diagnosis not present

## 2013-09-01 DIAGNOSIS — Z1211 Encounter for screening for malignant neoplasm of colon: Secondary | ICD-10-CM | POA: Diagnosis not present

## 2013-09-01 DIAGNOSIS — Z7982 Long term (current) use of aspirin: Secondary | ICD-10-CM | POA: Diagnosis not present

## 2013-09-01 DIAGNOSIS — Z79899 Other long term (current) drug therapy: Secondary | ICD-10-CM | POA: Diagnosis not present

## 2013-09-01 DIAGNOSIS — Z86010 Personal history of colonic polyps: Secondary | ICD-10-CM | POA: Diagnosis not present

## 2013-09-01 DIAGNOSIS — G4730 Sleep apnea, unspecified: Secondary | ICD-10-CM | POA: Diagnosis not present

## 2013-09-01 DIAGNOSIS — K5730 Diverticulosis of large intestine without perforation or abscess without bleeding: Secondary | ICD-10-CM | POA: Diagnosis not present

## 2013-09-01 DIAGNOSIS — M129 Arthropathy, unspecified: Secondary | ICD-10-CM | POA: Diagnosis not present

## 2013-09-01 DIAGNOSIS — D126 Benign neoplasm of colon, unspecified: Secondary | ICD-10-CM | POA: Diagnosis not present

## 2013-09-01 DIAGNOSIS — I10 Essential (primary) hypertension: Secondary | ICD-10-CM | POA: Diagnosis not present

## 2013-10-10 DIAGNOSIS — M545 Low back pain, unspecified: Secondary | ICD-10-CM | POA: Diagnosis not present

## 2013-10-10 DIAGNOSIS — F411 Generalized anxiety disorder: Secondary | ICD-10-CM | POA: Diagnosis not present

## 2013-10-10 DIAGNOSIS — M5137 Other intervertebral disc degeneration, lumbosacral region: Secondary | ICD-10-CM | POA: Diagnosis not present

## 2013-10-10 DIAGNOSIS — M47817 Spondylosis without myelopathy or radiculopathy, lumbosacral region: Secondary | ICD-10-CM | POA: Diagnosis not present

## 2013-10-10 DIAGNOSIS — M48061 Spinal stenosis, lumbar region without neurogenic claudication: Secondary | ICD-10-CM | POA: Diagnosis not present

## 2013-10-10 DIAGNOSIS — J449 Chronic obstructive pulmonary disease, unspecified: Secondary | ICD-10-CM | POA: Diagnosis not present

## 2013-10-10 DIAGNOSIS — M25569 Pain in unspecified knee: Secondary | ICD-10-CM | POA: Diagnosis not present

## 2013-10-17 ENCOUNTER — Ambulatory Visit: Payer: Self-pay | Admitting: Internal Medicine

## 2013-10-17 DIAGNOSIS — Z1231 Encounter for screening mammogram for malignant neoplasm of breast: Secondary | ICD-10-CM | POA: Diagnosis not present

## 2013-10-21 IMAGING — CT CT L SPINE W/ CM
4 of 11 series · 11 of 33 positions shown, 13 images · IV contrast ([ID] [id number])
Comparison: CT lumbar myelogram 03/02/2007.

CLINICAL DATA: 72-year-old female with post laminectomy syndrome.
Neural stimulator. Low back pain with increasing bilateral lower
extremity pain. Bilateral foot numbness.

BUN and creatinine were obtained on site at [HOSPITAL] at
[HOSPITAL].Results: BUN 12 mg/dL, Creatinine 0.7 mg/dL.
EXAM:
CT LUMBAR SPINE WITH CONTRAST
TECHNIQUE: Multidetector CT imaging of the lumbar spine was performed with
intravenous contrast administration. Multiplanar CT image
reconstructions were also generated.
CONTRAST:  100mL OMNIPAQUE IOHEXOL 300 MG/ML  SOLN

[Series 2: l-sp bone w/ · axial · 0.27mm/px · z∈[-275,-40]mm · 3 of 95 slices shown, 4 images]
[im 1/95  soft-tissue]
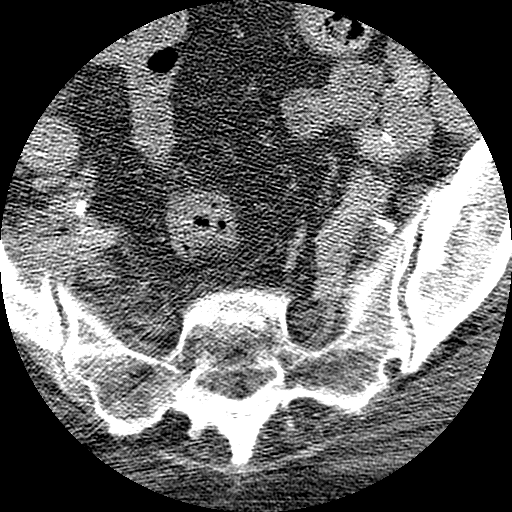
[im 1/95  bone]
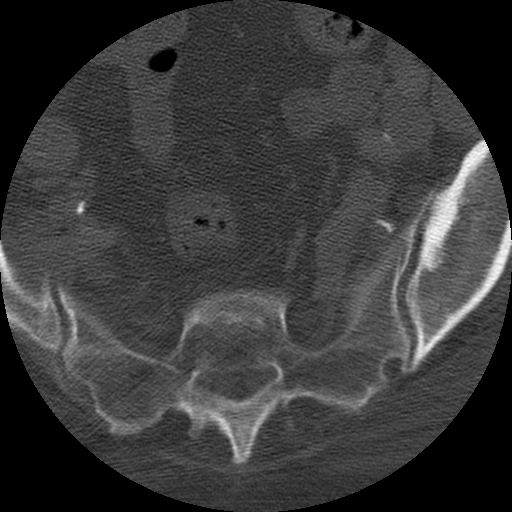
[im 48/95  bone]
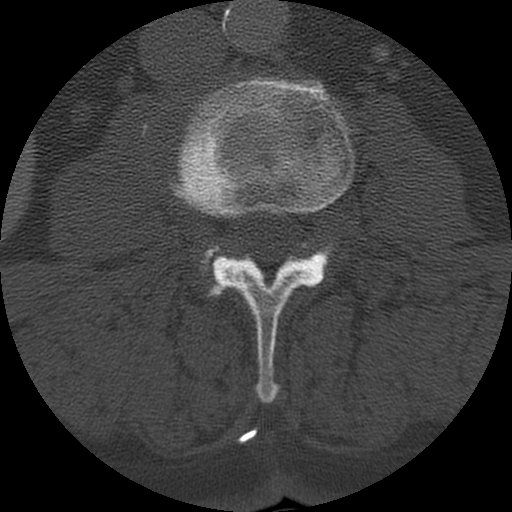
[im 95/95  bone]
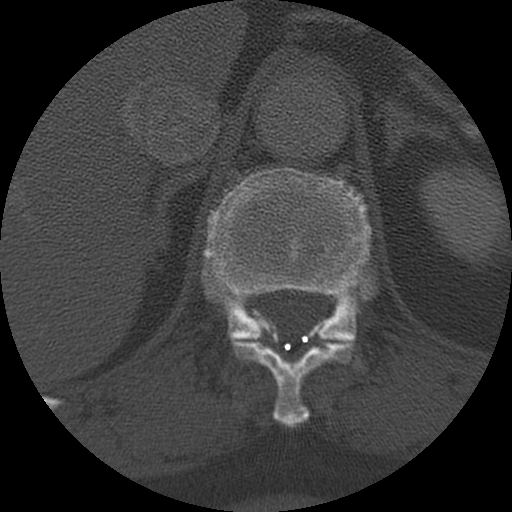

[Series 3: l-sp soft w/ · axial · 0.27mm/px · z∈[-197,-120]mm · 2 of 95 slices shown]
[im 32/95  soft-tissue]
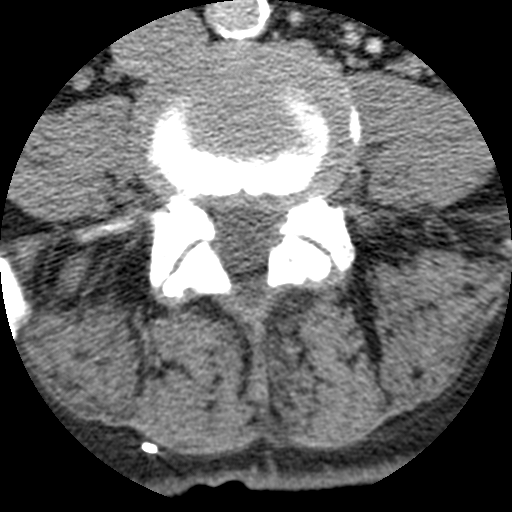
[im 63/95  soft-tissue]
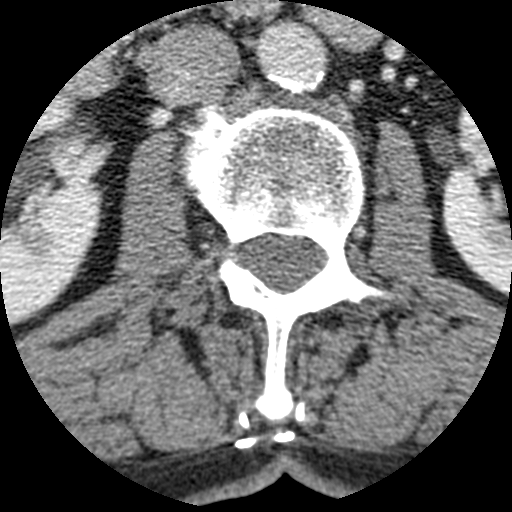

[Series 401: cor lower w/ · coronal · 0.47mm/px · 1 of 61 slices shown]
[im 31/61  bone]
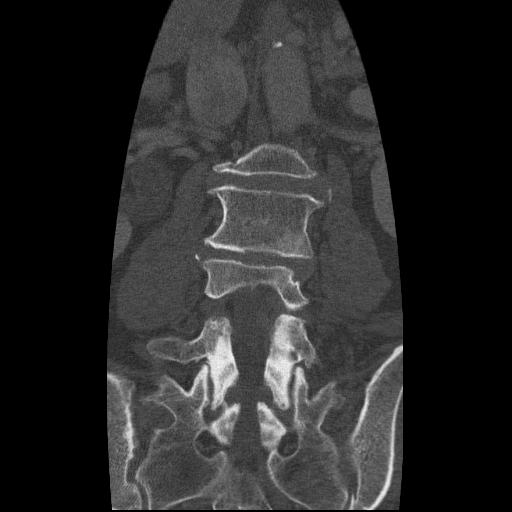

[Series 402: sag w/ · sagittal · 0.47mm/px · 5 of 59 slices shown, 6 images]
[im 20/59  bone]
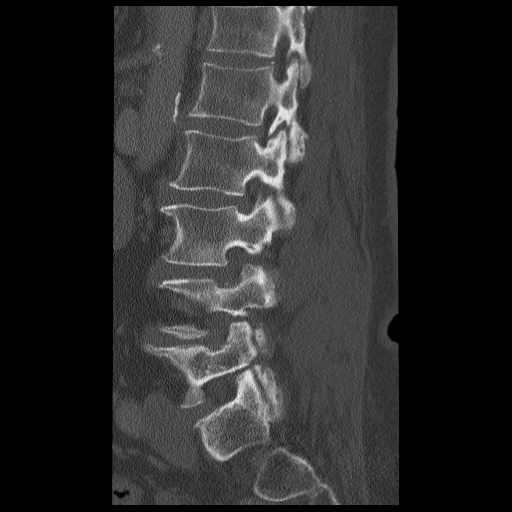
[im 25/59  bone]
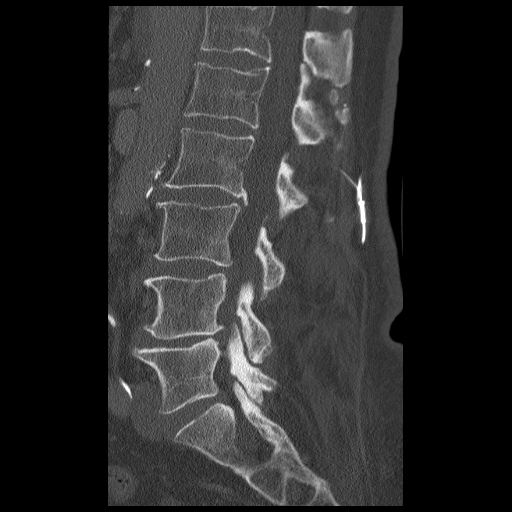
[im 30/59  soft-tissue]
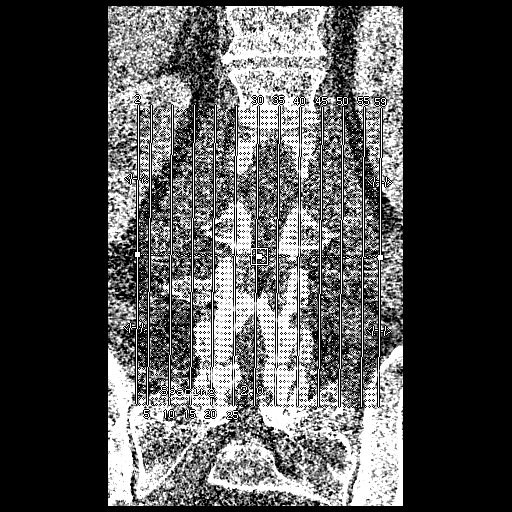
[im 30/59  bone]
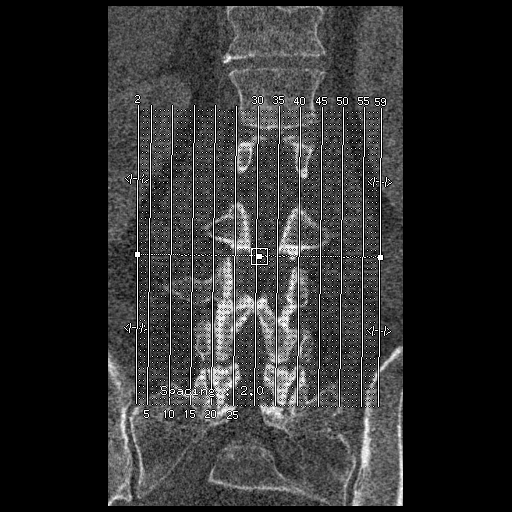
[im 34/59  bone]
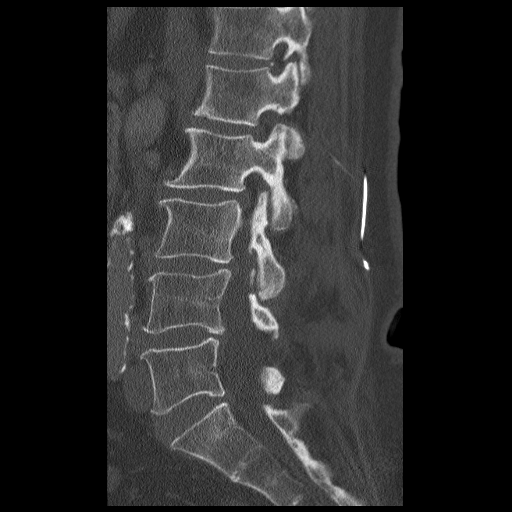
[im 39/59  bone]
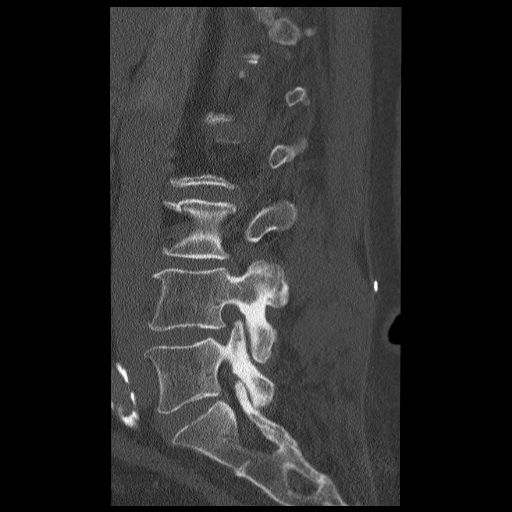

[11 of 33 positions shown; findings below may reference images not displayed]

FINDINGS: Spinal stimulator device with generator projecting at the right
posterior flank and electrical leads which enter the posterior
osseous spinal canal at the T12-L1 level is new since 0228. No
adverse features identified.

Stable visualized abdominal viscera including right adrenal gland
thickening and extensive Aortoiliac calcified atherosclerosis noted.

Stable vertebral height and alignment. Interval decompression
posteriorly at L4-L5. See details below. Visible sacrum and SI
joints intact. No acute osseous abnormality identified.

T12-L1: Mild facet hypertrophy. Mild circumferential disc osteophyte
complex. No significant stenosis.

L1-L2: Mild facet and ligament flavum hypertrophy greater on the
left. Mild circumferential disc bulge. No significant spinal
stenosis. Mild left greater than right L1 foraminal stenosis appears
increased.

L2-L3: Partially calcified circumferential disc bulge appears mildly
increased. Mild facet hypertrophy. No significant spinal stenosis
(estimated AP thecal sac 11 mm). Mild bilateral L2 foraminal
stenosis not significantly changed.

L3-L4: Stable mild circumferential disc bulge mildly progressed,
there seems to be a more broad-based posterior component. Moderate
facet hypertrophy greater on the right and ligament flavum
hypertrophy. Combined there now is mild spinal stenosis suspected
(estimated AP thecal sac 8 mm). Mild left greater than right L3
foraminal stenosis not significantly changed.

L4-L5: Interval partial laminectomy and decompression. Left
eccentric circumferential disc osteophyte complex appears mildly
increased. There could be a degree of residual left lateral recess
stenosis. Mild to moderate bilateral L4 foraminal stenosis not
significantly changed.

L5-S1: Mild circumferential disc bulge not significantly changed.
Mild to moderate facet hypertrophy. No spinal stenosis. No
significant foraminal stenosis.
IMPRESSION: 1. Interval L4-L5 decompression. disc osteophyte complex here
eccentric to the left and appears mildly increased. There could be a
degree of residual left lateral recess stenosis. Mild to moderate
bilateral L4 foraminal stenosis not significantly changed.

2. L3-L4 disc bulge also appears mildly progressed. Now suspect
multifactorial mild spinal stenosis at this level. Mild L3 foraminal
stenosis not significantly changed.

3. Disc and endplate degeneration also appears mildly increased at
L2-L3 but without spinal stenosis. Stable mild L2 foraminal
stenosis.

4. Mild multifactorial left greater than right L1 foraminal stenosis
appears increased.

5. Interval spinal stimulator device placed, enters the spinal canal
at T12-L1 and continues into the thoracic spine.

## 2013-11-02 DIAGNOSIS — I10 Essential (primary) hypertension: Secondary | ICD-10-CM | POA: Diagnosis not present

## 2013-11-02 DIAGNOSIS — E039 Hypothyroidism, unspecified: Secondary | ICD-10-CM | POA: Diagnosis not present

## 2013-11-02 DIAGNOSIS — Z79899 Other long term (current) drug therapy: Secondary | ICD-10-CM | POA: Diagnosis not present

## 2013-11-02 DIAGNOSIS — E782 Mixed hyperlipidemia: Secondary | ICD-10-CM | POA: Diagnosis not present

## 2013-11-07 DIAGNOSIS — M545 Low back pain, unspecified: Secondary | ICD-10-CM | POA: Diagnosis not present

## 2013-11-07 DIAGNOSIS — M48061 Spinal stenosis, lumbar region without neurogenic claudication: Secondary | ICD-10-CM | POA: Diagnosis not present

## 2013-11-07 DIAGNOSIS — J449 Chronic obstructive pulmonary disease, unspecified: Secondary | ICD-10-CM | POA: Diagnosis not present

## 2013-11-07 DIAGNOSIS — M5137 Other intervertebral disc degeneration, lumbosacral region: Secondary | ICD-10-CM | POA: Diagnosis not present

## 2013-11-07 DIAGNOSIS — M47817 Spondylosis without myelopathy or radiculopathy, lumbosacral region: Secondary | ICD-10-CM | POA: Diagnosis not present

## 2013-11-07 DIAGNOSIS — M25569 Pain in unspecified knee: Secondary | ICD-10-CM | POA: Diagnosis not present

## 2013-11-07 DIAGNOSIS — F411 Generalized anxiety disorder: Secondary | ICD-10-CM | POA: Diagnosis not present

## 2013-11-09 DIAGNOSIS — G4710 Hypersomnia, unspecified: Secondary | ICD-10-CM | POA: Diagnosis not present

## 2013-11-09 DIAGNOSIS — G4730 Sleep apnea, unspecified: Secondary | ICD-10-CM | POA: Diagnosis not present

## 2013-12-05 DIAGNOSIS — M545 Low back pain, unspecified: Secondary | ICD-10-CM | POA: Diagnosis not present

## 2013-12-05 DIAGNOSIS — J449 Chronic obstructive pulmonary disease, unspecified: Secondary | ICD-10-CM | POA: Diagnosis not present

## 2013-12-05 DIAGNOSIS — M48061 Spinal stenosis, lumbar region without neurogenic claudication: Secondary | ICD-10-CM | POA: Diagnosis not present

## 2013-12-05 DIAGNOSIS — M5137 Other intervertebral disc degeneration, lumbosacral region: Secondary | ICD-10-CM | POA: Diagnosis not present

## 2013-12-05 DIAGNOSIS — M47817 Spondylosis without myelopathy or radiculopathy, lumbosacral region: Secondary | ICD-10-CM | POA: Diagnosis not present

## 2013-12-05 DIAGNOSIS — M25569 Pain in unspecified knee: Secondary | ICD-10-CM | POA: Diagnosis not present

## 2013-12-05 DIAGNOSIS — F411 Generalized anxiety disorder: Secondary | ICD-10-CM | POA: Diagnosis not present

## 2013-12-21 DIAGNOSIS — IMO0002 Reserved for concepts with insufficient information to code with codable children: Secondary | ICD-10-CM | POA: Diagnosis not present

## 2013-12-21 DIAGNOSIS — M25559 Pain in unspecified hip: Secondary | ICD-10-CM | POA: Diagnosis not present

## 2014-01-03 DIAGNOSIS — IMO0002 Reserved for concepts with insufficient information to code with codable children: Secondary | ICD-10-CM | POA: Diagnosis not present

## 2014-02-06 DIAGNOSIS — M5137 Other intervertebral disc degeneration, lumbosacral region: Secondary | ICD-10-CM | POA: Diagnosis not present

## 2014-02-06 DIAGNOSIS — M545 Low back pain, unspecified: Secondary | ICD-10-CM | POA: Diagnosis not present

## 2014-02-06 DIAGNOSIS — J449 Chronic obstructive pulmonary disease, unspecified: Secondary | ICD-10-CM | POA: Diagnosis not present

## 2014-02-06 DIAGNOSIS — M25569 Pain in unspecified knee: Secondary | ICD-10-CM | POA: Diagnosis not present

## 2014-02-06 DIAGNOSIS — M25559 Pain in unspecified hip: Secondary | ICD-10-CM | POA: Diagnosis not present

## 2014-02-06 DIAGNOSIS — F411 Generalized anxiety disorder: Secondary | ICD-10-CM | POA: Diagnosis not present

## 2014-02-06 DIAGNOSIS — M48061 Spinal stenosis, lumbar region without neurogenic claudication: Secondary | ICD-10-CM | POA: Diagnosis not present

## 2014-02-06 DIAGNOSIS — M47817 Spondylosis without myelopathy or radiculopathy, lumbosacral region: Secondary | ICD-10-CM | POA: Diagnosis not present

## 2014-02-08 DIAGNOSIS — G4730 Sleep apnea, unspecified: Secondary | ICD-10-CM | POA: Diagnosis not present

## 2014-02-08 DIAGNOSIS — G4710 Hypersomnia, unspecified: Secondary | ICD-10-CM | POA: Diagnosis not present

## 2014-02-14 DIAGNOSIS — M25559 Pain in unspecified hip: Secondary | ICD-10-CM | POA: Diagnosis not present

## 2014-02-22 ENCOUNTER — Encounter: Payer: Self-pay | Admitting: Podiatry

## 2014-02-24 ENCOUNTER — Ambulatory Visit (INDEPENDENT_AMBULATORY_CARE_PROVIDER_SITE_OTHER): Payer: Medicare Other | Admitting: Podiatry

## 2014-02-24 ENCOUNTER — Encounter: Payer: Self-pay | Admitting: Podiatry

## 2014-02-24 VITALS — BP 142/75 | HR 64 | Resp 16 | Ht 63.0 in | Wt 176.0 lb

## 2014-02-24 DIAGNOSIS — L600 Ingrowing nail: Secondary | ICD-10-CM

## 2014-02-24 NOTE — Progress Notes (Signed)
Subjective:     Patient ID: [name], female   DOB: Mar 16, 1941, 73 y.o.   MRN: [id number]  HPI patient presents with painful third nail right and spicule of nail medial side right big toenail that bothers her and makes it hard to walk with   Review of Systems     Objective:   Physical Exam Neurovascular status intact with damaged thickened third nail right and medial spicule the right hallux that painful. Digits have good perfusion right    Assessment:     Damage third nail right and medial border right hallux    Plan:     Discussed procedures and recommended removal of the third nail and spicule right hallux. Explained risk which patient understands and she wants to procedure and today I infiltrated 60 mg like Marcaine mixture and remove the third nail spicule the right exposed the matrix and applied chemical phenol followed by alcohol lavaged sterile dressings. Gave instructions on soaks and reappoint for recheck

## 2014-02-24 NOTE — Patient Instructions (Signed)

## 2014-03-10 DIAGNOSIS — L259 Unspecified contact dermatitis, unspecified cause: Secondary | ICD-10-CM | POA: Diagnosis not present

## 2014-03-10 DIAGNOSIS — B354 Tinea corporis: Secondary | ICD-10-CM | POA: Diagnosis not present

## 2014-03-10 DIAGNOSIS — R21 Rash and other nonspecific skin eruption: Secondary | ICD-10-CM | POA: Diagnosis not present

## 2014-03-10 DIAGNOSIS — E039 Hypothyroidism, unspecified: Secondary | ICD-10-CM | POA: Diagnosis not present

## 2014-03-10 DIAGNOSIS — I10 Essential (primary) hypertension: Secondary | ICD-10-CM | POA: Diagnosis not present

## 2014-03-15 DIAGNOSIS — IMO0002 Reserved for concepts with insufficient information to code with codable children: Secondary | ICD-10-CM | POA: Insufficient documentation

## 2014-03-21 DIAGNOSIS — E039 Hypothyroidism, unspecified: Secondary | ICD-10-CM | POA: Diagnosis not present

## 2014-03-21 DIAGNOSIS — E782 Mixed hyperlipidemia: Secondary | ICD-10-CM | POA: Diagnosis not present

## 2014-03-21 DIAGNOSIS — M159 Polyosteoarthritis, unspecified: Secondary | ICD-10-CM | POA: Diagnosis not present

## 2014-03-21 DIAGNOSIS — I10 Essential (primary) hypertension: Secondary | ICD-10-CM | POA: Diagnosis not present

## 2014-03-21 DIAGNOSIS — F411 Generalized anxiety disorder: Secondary | ICD-10-CM | POA: Diagnosis not present

## 2014-03-23 DIAGNOSIS — IMO0002 Reserved for concepts with insufficient information to code with codable children: Secondary | ICD-10-CM | POA: Diagnosis not present

## 2014-04-24 DIAGNOSIS — M47817 Spondylosis without myelopathy or radiculopathy, lumbosacral region: Secondary | ICD-10-CM | POA: Diagnosis not present

## 2014-04-24 DIAGNOSIS — M48061 Spinal stenosis, lumbar region without neurogenic claudication: Secondary | ICD-10-CM | POA: Diagnosis not present

## 2014-04-24 DIAGNOSIS — M25569 Pain in unspecified knee: Secondary | ICD-10-CM | POA: Diagnosis not present

## 2014-04-24 DIAGNOSIS — M545 Low back pain, unspecified: Secondary | ICD-10-CM | POA: Diagnosis not present

## 2014-04-24 DIAGNOSIS — F411 Generalized anxiety disorder: Secondary | ICD-10-CM | POA: Diagnosis not present

## 2014-04-24 DIAGNOSIS — G8929 Other chronic pain: Secondary | ICD-10-CM | POA: Diagnosis not present

## 2014-04-24 DIAGNOSIS — M5137 Other intervertebral disc degeneration, lumbosacral region: Secondary | ICD-10-CM | POA: Diagnosis not present

## 2014-04-24 DIAGNOSIS — J449 Chronic obstructive pulmonary disease, unspecified: Secondary | ICD-10-CM | POA: Diagnosis not present

## 2014-05-24 DIAGNOSIS — G4710 Hypersomnia, unspecified: Secondary | ICD-10-CM | POA: Diagnosis not present

## 2014-05-24 DIAGNOSIS — G4730 Sleep apnea, unspecified: Secondary | ICD-10-CM | POA: Diagnosis not present

## 2014-05-27 DIAGNOSIS — Z23 Encounter for immunization: Secondary | ICD-10-CM | POA: Diagnosis not present

## 2014-06-19 DIAGNOSIS — M545 Low back pain, unspecified: Secondary | ICD-10-CM | POA: Diagnosis not present

## 2014-06-19 DIAGNOSIS — J449 Chronic obstructive pulmonary disease, unspecified: Secondary | ICD-10-CM | POA: Diagnosis not present

## 2014-06-19 DIAGNOSIS — M5137 Other intervertebral disc degeneration, lumbosacral region: Secondary | ICD-10-CM | POA: Diagnosis not present

## 2014-06-19 DIAGNOSIS — F411 Generalized anxiety disorder: Secondary | ICD-10-CM | POA: Diagnosis not present

## 2014-06-19 DIAGNOSIS — M48061 Spinal stenosis, lumbar region without neurogenic claudication: Secondary | ICD-10-CM | POA: Diagnosis not present

## 2014-06-19 DIAGNOSIS — M25569 Pain in unspecified knee: Secondary | ICD-10-CM | POA: Diagnosis not present

## 2014-06-19 DIAGNOSIS — M47817 Spondylosis without myelopathy or radiculopathy, lumbosacral region: Secondary | ICD-10-CM | POA: Diagnosis not present

## 2014-06-19 DIAGNOSIS — Z79899 Other long term (current) drug therapy: Secondary | ICD-10-CM | POA: Diagnosis not present

## 2014-08-10 DIAGNOSIS — R5381 Other malaise: Secondary | ICD-10-CM | POA: Diagnosis not present

## 2014-08-10 DIAGNOSIS — I10 Essential (primary) hypertension: Secondary | ICD-10-CM | POA: Diagnosis not present

## 2014-08-10 DIAGNOSIS — E039 Hypothyroidism, unspecified: Secondary | ICD-10-CM | POA: Diagnosis not present

## 2014-08-15 DIAGNOSIS — M25561 Pain in right knee: Secondary | ICD-10-CM | POA: Diagnosis not present

## 2014-08-21 ENCOUNTER — Other Ambulatory Visit (HOSPITAL_COMMUNITY): Payer: Self-pay | Admitting: Orthopedic Surgery

## 2014-08-21 DIAGNOSIS — M25561 Pain in right knee: Secondary | ICD-10-CM

## 2014-08-21 DIAGNOSIS — E039 Hypothyroidism, unspecified: Secondary | ICD-10-CM | POA: Diagnosis not present

## 2014-08-25 ENCOUNTER — Encounter (HOSPITAL_COMMUNITY)
Admission: RE | Admit: 2014-08-25 | Discharge: 2014-08-25 | Disposition: A | Discharge status: Home / Self Care | Payer: Medicare Other | Source: Ambulatory Visit | Attending: Orthopedic Surgery | Admitting: Orthopedic Surgery

## 2014-08-25 DIAGNOSIS — M25561 Pain in right knee: Secondary | ICD-10-CM | POA: Insufficient documentation

## 2014-08-25 DIAGNOSIS — R948 Abnormal results of function studies of other organs and systems: Secondary | ICD-10-CM | POA: Diagnosis not present

## 2014-08-25 MED ORDER — TECHNETIUM TC 99M MEDRONATE IV KIT
25.7000 | PACK | Freq: Once | INTRAVENOUS | Status: AC | PRN
  Administered 2014-08-25: 25.7 via INTRAVENOUS

## 2014-08-28 DIAGNOSIS — M25562 Pain in left knee: Secondary | ICD-10-CM | POA: Diagnosis not present

## 2014-08-28 DIAGNOSIS — M47817 Spondylosis without myelopathy or radiculopathy, lumbosacral region: Secondary | ICD-10-CM | POA: Diagnosis not present

## 2014-08-28 DIAGNOSIS — F411 Generalized anxiety disorder: Secondary | ICD-10-CM | POA: Diagnosis not present

## 2014-08-28 DIAGNOSIS — M545 Low back pain: Secondary | ICD-10-CM | POA: Diagnosis not present

## 2014-08-28 DIAGNOSIS — M549 Dorsalgia, unspecified: Secondary | ICD-10-CM | POA: Diagnosis not present

## 2014-08-28 DIAGNOSIS — M25561 Pain in right knee: Secondary | ICD-10-CM | POA: Diagnosis not present

## 2014-08-28 DIAGNOSIS — M5137 Other intervertebral disc degeneration, lumbosacral region: Secondary | ICD-10-CM | POA: Diagnosis not present

## 2014-08-28 DIAGNOSIS — M4807 Spinal stenosis, lumbosacral region: Secondary | ICD-10-CM | POA: Diagnosis not present

## 2014-08-30 DIAGNOSIS — G4733 Obstructive sleep apnea (adult) (pediatric): Secondary | ICD-10-CM | POA: Diagnosis not present

## 2014-09-04 DIAGNOSIS — M25561 Pain in right knee: Secondary | ICD-10-CM | POA: Diagnosis not present

## 2014-09-26 DIAGNOSIS — E039 Hypothyroidism, unspecified: Secondary | ICD-10-CM | POA: Diagnosis not present

## 2014-09-26 DIAGNOSIS — Z0001 Encounter for general adult medical examination with abnormal findings: Secondary | ICD-10-CM | POA: Diagnosis not present

## 2014-09-26 DIAGNOSIS — E6609 Other obesity due to excess calories: Secondary | ICD-10-CM | POA: Diagnosis not present

## 2014-09-26 DIAGNOSIS — E119 Type 2 diabetes mellitus without complications: Secondary | ICD-10-CM | POA: Diagnosis not present

## 2014-09-26 DIAGNOSIS — R300 Dysuria: Secondary | ICD-10-CM | POA: Diagnosis not present

## 2014-09-26 DIAGNOSIS — E782 Mixed hyperlipidemia: Secondary | ICD-10-CM | POA: Diagnosis not present

## 2014-09-26 DIAGNOSIS — I10 Essential (primary) hypertension: Secondary | ICD-10-CM | POA: Diagnosis not present

## 2014-10-06 DIAGNOSIS — Z780 Asymptomatic menopausal state: Secondary | ICD-10-CM | POA: Diagnosis not present

## 2014-10-06 DIAGNOSIS — Z13820 Encounter for screening for osteoporosis: Secondary | ICD-10-CM | POA: Diagnosis not present

## 2014-10-06 DIAGNOSIS — E2839 Other primary ovarian failure: Secondary | ICD-10-CM | POA: Diagnosis not present

## 2014-10-16 DIAGNOSIS — T8484XD Pain due to internal orthopedic prosthetic devices, implants and grafts, subsequent encounter: Secondary | ICD-10-CM | POA: Diagnosis not present

## 2014-10-18 ENCOUNTER — Ambulatory Visit: Payer: Self-pay | Admitting: Internal Medicine

## 2014-10-18 DIAGNOSIS — Z1231 Encounter for screening mammogram for malignant neoplasm of breast: Secondary | ICD-10-CM | POA: Diagnosis not present

## 2014-10-23 DIAGNOSIS — M4807 Spinal stenosis, lumbosacral region: Secondary | ICD-10-CM | POA: Diagnosis not present

## 2014-10-23 DIAGNOSIS — M545 Low back pain: Secondary | ICD-10-CM | POA: Diagnosis not present

## 2014-10-23 DIAGNOSIS — Z79891 Long term (current) use of opiate analgesic: Secondary | ICD-10-CM | POA: Diagnosis not present

## 2014-10-23 DIAGNOSIS — F411 Generalized anxiety disorder: Secondary | ICD-10-CM | POA: Diagnosis not present

## 2014-10-23 DIAGNOSIS — M5137 Other intervertebral disc degeneration, lumbosacral region: Secondary | ICD-10-CM | POA: Diagnosis not present

## 2014-10-23 DIAGNOSIS — M25561 Pain in right knee: Secondary | ICD-10-CM | POA: Diagnosis not present

## 2014-10-23 DIAGNOSIS — M25562 Pain in left knee: Secondary | ICD-10-CM | POA: Diagnosis not present

## 2014-10-23 DIAGNOSIS — M549 Dorsalgia, unspecified: Secondary | ICD-10-CM | POA: Diagnosis not present

## 2014-10-23 DIAGNOSIS — M47817 Spondylosis without myelopathy or radiculopathy, lumbosacral region: Secondary | ICD-10-CM | POA: Diagnosis not present

## 2014-10-25 DIAGNOSIS — D485 Neoplasm of uncertain behavior of skin: Secondary | ICD-10-CM | POA: Diagnosis not present

## 2014-10-25 DIAGNOSIS — L821 Other seborrheic keratosis: Secondary | ICD-10-CM | POA: Diagnosis not present

## 2014-10-25 DIAGNOSIS — D0471 Carcinoma in situ of skin of right lower limb, including hip: Secondary | ICD-10-CM | POA: Diagnosis not present

## 2014-10-25 DIAGNOSIS — D0462 Carcinoma in situ of skin of left upper limb, including shoulder: Secondary | ICD-10-CM | POA: Diagnosis not present

## 2014-10-25 DIAGNOSIS — L570 Actinic keratosis: Secondary | ICD-10-CM | POA: Diagnosis not present

## 2014-11-16 DIAGNOSIS — D0471 Carcinoma in situ of skin of right lower limb, including hip: Secondary | ICD-10-CM | POA: Diagnosis not present

## 2014-11-16 DIAGNOSIS — L570 Actinic keratosis: Secondary | ICD-10-CM | POA: Diagnosis not present

## 2014-11-16 DIAGNOSIS — D0472 Carcinoma in situ of skin of left lower limb, including hip: Secondary | ICD-10-CM | POA: Diagnosis not present

## 2014-12-06 DIAGNOSIS — G4733 Obstructive sleep apnea (adult) (pediatric): Secondary | ICD-10-CM | POA: Diagnosis not present

## 2014-12-25 DIAGNOSIS — M25561 Pain in right knee: Secondary | ICD-10-CM | POA: Diagnosis not present

## 2014-12-25 DIAGNOSIS — M47817 Spondylosis without myelopathy or radiculopathy, lumbosacral region: Secondary | ICD-10-CM | POA: Diagnosis not present

## 2014-12-25 DIAGNOSIS — Z79891 Long term (current) use of opiate analgesic: Secondary | ICD-10-CM | POA: Diagnosis not present

## 2014-12-25 DIAGNOSIS — M5137 Other intervertebral disc degeneration, lumbosacral region: Secondary | ICD-10-CM | POA: Diagnosis not present

## 2014-12-25 DIAGNOSIS — F411 Generalized anxiety disorder: Secondary | ICD-10-CM | POA: Diagnosis not present

## 2014-12-25 DIAGNOSIS — M25562 Pain in left knee: Secondary | ICD-10-CM | POA: Diagnosis not present

## 2014-12-25 DIAGNOSIS — M545 Low back pain: Secondary | ICD-10-CM | POA: Diagnosis not present

## 2014-12-25 DIAGNOSIS — M4807 Spinal stenosis, lumbosacral region: Secondary | ICD-10-CM | POA: Diagnosis not present

## 2014-12-25 DIAGNOSIS — Z79899 Other long term (current) drug therapy: Secondary | ICD-10-CM | POA: Diagnosis not present

## 2014-12-25 DIAGNOSIS — M549 Dorsalgia, unspecified: Secondary | ICD-10-CM | POA: Diagnosis not present

## 2014-12-28 DIAGNOSIS — T8484XD Pain due to internal orthopedic prosthetic devices, implants and grafts, subsequent encounter: Secondary | ICD-10-CM | POA: Diagnosis not present

## 2015-01-09 DIAGNOSIS — J301 Allergic rhinitis due to pollen: Secondary | ICD-10-CM | POA: Diagnosis not present

## 2015-01-09 DIAGNOSIS — G4733 Obstructive sleep apnea (adult) (pediatric): Secondary | ICD-10-CM | POA: Diagnosis not present

## 2015-01-09 DIAGNOSIS — E039 Hypothyroidism, unspecified: Secondary | ICD-10-CM | POA: Diagnosis not present

## 2015-01-09 DIAGNOSIS — Z79891 Long term (current) use of opiate analgesic: Secondary | ICD-10-CM | POA: Diagnosis not present

## 2015-01-09 DIAGNOSIS — F17210 Nicotine dependence, cigarettes, uncomplicated: Secondary | ICD-10-CM | POA: Diagnosis not present

## 2015-01-29 DIAGNOSIS — F17210 Nicotine dependence, cigarettes, uncomplicated: Secondary | ICD-10-CM | POA: Diagnosis not present

## 2015-01-29 DIAGNOSIS — K5900 Constipation, unspecified: Secondary | ICD-10-CM | POA: Diagnosis not present

## 2015-01-29 DIAGNOSIS — J301 Allergic rhinitis due to pollen: Secondary | ICD-10-CM | POA: Diagnosis not present

## 2015-01-29 DIAGNOSIS — I10 Essential (primary) hypertension: Secondary | ICD-10-CM | POA: Diagnosis not present

## 2015-01-29 DIAGNOSIS — G4733 Obstructive sleep apnea (adult) (pediatric): Secondary | ICD-10-CM | POA: Diagnosis not present

## 2015-03-16 DIAGNOSIS — Z85828 Personal history of other malignant neoplasm of skin: Secondary | ICD-10-CM | POA: Diagnosis not present

## 2015-03-16 DIAGNOSIS — D485 Neoplasm of uncertain behavior of skin: Secondary | ICD-10-CM | POA: Diagnosis not present

## 2015-03-16 DIAGNOSIS — D225 Melanocytic nevi of trunk: Secondary | ICD-10-CM | POA: Diagnosis not present

## 2015-03-16 DIAGNOSIS — L821 Other seborrheic keratosis: Secondary | ICD-10-CM | POA: Diagnosis not present

## 2015-03-16 DIAGNOSIS — D2262 Melanocytic nevi of left upper limb, including shoulder: Secondary | ICD-10-CM | POA: Diagnosis not present

## 2015-03-16 DIAGNOSIS — B001 Herpesviral vesicular dermatitis: Secondary | ICD-10-CM | POA: Diagnosis not present

## 2015-03-21 DIAGNOSIS — G4733 Obstructive sleep apnea (adult) (pediatric): Secondary | ICD-10-CM | POA: Diagnosis not present

## 2015-04-02 DIAGNOSIS — Z79899 Other long term (current) drug therapy: Secondary | ICD-10-CM | POA: Diagnosis not present

## 2015-04-02 DIAGNOSIS — M549 Dorsalgia, unspecified: Secondary | ICD-10-CM | POA: Diagnosis not present

## 2015-04-02 DIAGNOSIS — M47817 Spondylosis without myelopathy or radiculopathy, lumbosacral region: Secondary | ICD-10-CM | POA: Diagnosis not present

## 2015-04-02 DIAGNOSIS — M25561 Pain in right knee: Secondary | ICD-10-CM | POA: Diagnosis not present

## 2015-04-02 DIAGNOSIS — M545 Low back pain: Secondary | ICD-10-CM | POA: Diagnosis not present

## 2015-04-02 DIAGNOSIS — M5137 Other intervertebral disc degeneration, lumbosacral region: Secondary | ICD-10-CM | POA: Diagnosis not present

## 2015-04-02 DIAGNOSIS — M4807 Spinal stenosis, lumbosacral region: Secondary | ICD-10-CM | POA: Diagnosis not present

## 2015-04-02 DIAGNOSIS — M25562 Pain in left knee: Secondary | ICD-10-CM | POA: Diagnosis not present

## 2015-04-02 DIAGNOSIS — F411 Generalized anxiety disorder: Secondary | ICD-10-CM | POA: Diagnosis not present

## 2015-05-31 DIAGNOSIS — F419 Anxiety disorder, unspecified: Secondary | ICD-10-CM | POA: Diagnosis not present

## 2015-05-31 DIAGNOSIS — R42 Dizziness and giddiness: Secondary | ICD-10-CM | POA: Diagnosis not present

## 2015-05-31 DIAGNOSIS — J0191 Acute recurrent sinusitis, unspecified: Secondary | ICD-10-CM | POA: Diagnosis not present

## 2015-05-31 DIAGNOSIS — I10 Essential (primary) hypertension: Secondary | ICD-10-CM | POA: Diagnosis not present

## 2015-05-31 DIAGNOSIS — E039 Hypothyroidism, unspecified: Secondary | ICD-10-CM | POA: Diagnosis not present

## 2015-06-12 ENCOUNTER — Ambulatory Visit (INDEPENDENT_AMBULATORY_CARE_PROVIDER_SITE_OTHER): Payer: Medicare Other | Admitting: Podiatry

## 2015-06-12 ENCOUNTER — Encounter: Payer: Self-pay | Admitting: Podiatry

## 2015-06-12 VITALS — BP 136/67 | HR 58 | Resp 16

## 2015-06-12 DIAGNOSIS — M79676 Pain in unspecified toe(s): Secondary | ICD-10-CM

## 2015-06-12 DIAGNOSIS — L600 Ingrowing nail: Secondary | ICD-10-CM

## 2015-06-12 DIAGNOSIS — L603 Nail dystrophy: Secondary | ICD-10-CM | POA: Diagnosis not present

## 2015-06-12 NOTE — Progress Notes (Signed)
Patient ID: [name], female   DOB: 1941/07/20, 74 y.o.   MRN: [id number]  Subjective: 74 year old female presents the office with concerns of right fourth digit toenail thickening and ingrowing of the toenail. She states that she's had pain in this toenail for several months and she has tried to trim the toenail the any relief of symptoms. She denies any redness or drainage from the toenail. At this time she is a questionable nail be permanently removed. She is present had several other nails permanently removed without any complications. No other complaints at this time in no acute changes since last appointment.  Objective: AAO 3, NAD DP/PT pulses palpable, CRT less than 3 seconds Protective sensation intact with Derrel Nip monofilament Right fourth digit nails hypertrophic, dystrophic, discolored. There is incurvation on both the medial and lateral nail border. There's tenderness to palpation overlying the entire nail. There is no swelling erythema or drainage. No open lesions or pre-ulcerative lesions. No other areas of tenderness to bilateral lower extremity. There is no overlying erythema or increase in warmth bilaterally. No pain with calf compression, swelling, warmth, erythema.  Assessment: 74 year old female right fourth digit onychodystrophy, ingrown toenail  Plan: -Treatment options discussed including all alternatives, risks, and complications -At this time, the patient is requesting total nail removal with chemical matricectomy to the symptomatic portion of the nail. Risks and complications were discussed with the patient for which they understand and  verbally consent to the procedure. Under sterile conditions a total of 3 mL of a mixture of 2% lidocaine plain and 0.5% Marcaine plain was infiltrated in a digital block fashion. Once anesthetized, the skin was prepped in sterile fashion. A tourniquet was then applied. Next the right 4th digit nail was then excised making  sure to remove the entire offending nail border. Once the nails were ensured to be removed area was debrided and the underlying skin was intact. There is no purulence identified in the procedure. Next phenol was then applied under standard conditions and copiously irrigated. Silvadene was applied. A dry sterile dressing was applied. After application of the dressing the tourniquet was removed and there is found to be an immediate capillary refill time to the digit. The patient tolerated the procedure well any complications. Post procedure instructions were discussed the patient for which he verbally understood. Follow-up in one week for nail check or sooner if any problems are to arise. Discussed signs/symptoms of infection and directed to call the office immediately should any occur or go directly to the emergency room. In the meantime, encouraged to call the office with any questions, concerns, changes symptoms.  Celesta Gentile, DPM

## 2015-06-12 NOTE — Patient Instructions (Signed)

## 2015-06-19 ENCOUNTER — Ambulatory Visit (INDEPENDENT_AMBULATORY_CARE_PROVIDER_SITE_OTHER): Payer: Medicare Other | Admitting: Podiatry

## 2015-06-19 ENCOUNTER — Encounter: Payer: Self-pay | Admitting: Podiatry

## 2015-06-19 DIAGNOSIS — Z9889 Other specified postprocedural states: Secondary | ICD-10-CM

## 2015-06-19 DIAGNOSIS — L600 Ingrowing nail: Secondary | ICD-10-CM

## 2015-06-19 NOTE — Patient Instructions (Signed)

## 2015-06-20 DIAGNOSIS — Z23 Encounter for immunization: Secondary | ICD-10-CM | POA: Diagnosis not present

## 2015-06-20 DIAGNOSIS — R55 Syncope and collapse: Secondary | ICD-10-CM | POA: Diagnosis not present

## 2015-06-20 NOTE — Progress Notes (Signed)
Patient ID: [name], female   DOB: [birth year], 75 y.o.   MRN: [id number]  Subjective: 74 year old female presents the office today for evaluation of nail avulsion of the right fourth toe. She gets an occasional discomfort however she states the areas much improved compared to what it was prior to the procedure. She is limitation of clear drainage but denies any pus. Denies any swelling redness or red streaks. She is and soaking her foot in Epson salt soaks cover with antibiotic ointment and a Band-Aid. No other complaints at this time in no acute changes. She denies any systemic complaints as fevers, chills, nausea, vomiting. No calf pain, chest pain, shortness of breath.  Objective: AAO 3, NAD Neurovascular status intact and unchanged Status post right fourth toenail avulsion but is healing well. There is a small scab overlying the area. There is a faint rim of erythema likely from inflammation as opposed to infection. There is no ascending cellulitis, fluctuance, crepitus, malodor, drainage/purulence. There is no tenderness to palpation around the area at this time. No other areas of tenderness. No pain with calf compression, swelling, warmth, erythema  Assessment: 74 year old female 1 week status post right fourth toe nail avulsion with chemical matricectomy doing well  Plan: Continue soaking in epsom salts twice a day followed by antibiotic ointment and a band-aid. Can leave uncovered at night. Continue this until completely healed.  If the area has not healed in 2 weeks, call the office for follow-up appointment, or sooner if any problems arise.  Monitor for any signs/symptoms of infection. Call the office immediately if any occur or go directly to the emergency room. Call with any questions/concerns.  Celesta Gentile, DPM

## 2015-07-02 ENCOUNTER — Other Ambulatory Visit: Payer: Self-pay | Admitting: Physician Assistant

## 2015-07-02 DIAGNOSIS — M549 Dorsalgia, unspecified: Secondary | ICD-10-CM | POA: Diagnosis not present

## 2015-07-02 DIAGNOSIS — M545 Low back pain: Secondary | ICD-10-CM | POA: Diagnosis not present

## 2015-07-02 DIAGNOSIS — R42 Dizziness and giddiness: Secondary | ICD-10-CM | POA: Diagnosis not present

## 2015-07-02 DIAGNOSIS — M5137 Other intervertebral disc degeneration, lumbosacral region: Secondary | ICD-10-CM | POA: Diagnosis not present

## 2015-07-02 DIAGNOSIS — M4807 Spinal stenosis, lumbosacral region: Secondary | ICD-10-CM | POA: Diagnosis not present

## 2015-07-02 DIAGNOSIS — I6522 Occlusion and stenosis of left carotid artery: Secondary | ICD-10-CM

## 2015-07-02 DIAGNOSIS — H6121 Impacted cerumen, right ear: Secondary | ICD-10-CM | POA: Diagnosis not present

## 2015-07-02 DIAGNOSIS — F411 Generalized anxiety disorder: Secondary | ICD-10-CM | POA: Diagnosis not present

## 2015-07-02 DIAGNOSIS — M25562 Pain in left knee: Secondary | ICD-10-CM | POA: Diagnosis not present

## 2015-07-02 DIAGNOSIS — I6523 Occlusion and stenosis of bilateral carotid arteries: Secondary | ICD-10-CM | POA: Diagnosis not present

## 2015-07-02 DIAGNOSIS — M25561 Pain in right knee: Secondary | ICD-10-CM | POA: Diagnosis not present

## 2015-07-02 DIAGNOSIS — M47817 Spondylosis without myelopathy or radiculopathy, lumbosacral region: Secondary | ICD-10-CM | POA: Diagnosis not present

## 2015-07-02 DIAGNOSIS — Z79899 Other long term (current) drug therapy: Secondary | ICD-10-CM | POA: Diagnosis not present

## 2015-07-11 DIAGNOSIS — R0602 Shortness of breath: Secondary | ICD-10-CM | POA: Diagnosis not present

## 2015-07-14 DIAGNOSIS — Z96651 Presence of right artificial knee joint: Secondary | ICD-10-CM | POA: Diagnosis not present

## 2015-07-14 DIAGNOSIS — T8484XD Pain due to internal orthopedic prosthetic devices, implants and grafts, subsequent encounter: Secondary | ICD-10-CM | POA: Diagnosis not present

## 2015-07-16 DIAGNOSIS — T8484XD Pain due to internal orthopedic prosthetic devices, implants and grafts, subsequent encounter: Secondary | ICD-10-CM | POA: Diagnosis not present

## 2015-07-19 DIAGNOSIS — T84012D Broken internal right knee prosthesis, subsequent encounter: Secondary | ICD-10-CM | POA: Diagnosis not present

## 2015-07-19 DIAGNOSIS — M25561 Pain in right knee: Secondary | ICD-10-CM | POA: Diagnosis not present

## 2015-07-25 DIAGNOSIS — G4733 Obstructive sleep apnea (adult) (pediatric): Secondary | ICD-10-CM | POA: Diagnosis not present

## 2015-07-30 ENCOUNTER — Ambulatory Visit
Admission: RE | Admit: 2015-07-30 | Discharge: 2015-07-30 | Disposition: A | Discharge status: Home / Self Care | Payer: Medicare Other | Source: Ambulatory Visit | Attending: Physician Assistant | Admitting: Physician Assistant

## 2015-07-30 DIAGNOSIS — I700 Atherosclerosis of aorta: Secondary | ICD-10-CM | POA: Insufficient documentation

## 2015-07-30 DIAGNOSIS — I6523 Occlusion and stenosis of bilateral carotid arteries: Secondary | ICD-10-CM | POA: Insufficient documentation

## 2015-07-30 DIAGNOSIS — M4802 Spinal stenosis, cervical region: Secondary | ICD-10-CM | POA: Diagnosis not present

## 2015-07-30 DIAGNOSIS — I6522 Occlusion and stenosis of left carotid artery: Secondary | ICD-10-CM

## 2015-07-30 DIAGNOSIS — Z01812 Encounter for preprocedural laboratory examination: Secondary | ICD-10-CM | POA: Diagnosis not present

## 2015-07-30 DIAGNOSIS — I6509 Occlusion and stenosis of unspecified vertebral artery: Secondary | ICD-10-CM | POA: Insufficient documentation

## 2015-07-30 LAB — POCT I-STAT CREATININE: Creatinine, Ser: 0.8 mg/dL (ref 0.44–1.00)

## 2015-07-30 MED ORDER — IOHEXOL 350 MG/ML SOLN
80.0000 mL | Freq: Once | INTRAVENOUS | Status: AC | PRN
  Administered 2015-07-30: 80 mL via INTRAVENOUS

## 2015-07-31 DIAGNOSIS — R0602 Shortness of breath: Secondary | ICD-10-CM | POA: Diagnosis not present

## 2015-07-31 DIAGNOSIS — I6523 Occlusion and stenosis of bilateral carotid arteries: Secondary | ICD-10-CM | POA: Diagnosis not present

## 2015-07-31 DIAGNOSIS — J301 Allergic rhinitis due to pollen: Secondary | ICD-10-CM | POA: Diagnosis not present

## 2015-07-31 DIAGNOSIS — G4733 Obstructive sleep apnea (adult) (pediatric): Secondary | ICD-10-CM | POA: Diagnosis not present

## 2015-07-31 DIAGNOSIS — F17210 Nicotine dependence, cigarettes, uncomplicated: Secondary | ICD-10-CM | POA: Diagnosis not present

## 2015-08-02 NOTE — Patient Instructions (Addendum)
YOUR PROCEDURE IS SCHEDULED ON :  08/13/15  REPORT TO [facility] MAIN ENTRANCE FOLLOW SIGNS TO EAST ELEVATOR - GO TO 3rd FLOOR CHECK IN AT 3 EAST NURSES STATION (SHORT STAY) AT:  10:30 AM  CALL THIS NUMBER IF YOU HAVE PROBLEMS THE MORNING OF SURGERY [phone]  REMEMBER:ONLY 1 PER PERSON MAY GO TO SHORT STAY WITH YOU TO GET READY THE MORNING OF YOUR SURGERY  DO NOT EAT FOOD AFTER MIDNIGHT  MAY HAVE CLEAR LIQUIDS UNTIL 6:30 AM  TAKE THESE MEDICINES THE MORNING OF SURGERY: Zyrtec, Carvedilol, Celexa, Levothyroxine, Simvastatin  CLEAR LIQUID DIET  Foods Allowed                                                                     Foods Excluded  Coffee and tea, regular and decaf                             liquids that you cannot  Plain Jell-O in any flavor                                             see through such as: Fruit ices (not with fruit pulp)                                     milk, soups, orange juice  Iced Popsicles                                                All solid food Carbonated beverages, regular and diet                                    Cranberry, grape and apple juices Sports drinks like Gatorade Lightly seasoned clear broth or consume(fat free) Sugar, honey syrup _____________________________________________________________________    YOU MAY NOT HAVE ANY METAL ON YOUR BODY INCLUDING HAIR PINS AND PIERCING'S. DO NOT WEAR JEWELRY, MAKEUP, LOTIONS, POWDERS OR PERFUMES. DO NOT WEAR NAIL POLISH. DO NOT SHAVE 48 HRS PRIOR TO SURGERY. MEN MAY SHAVE FACE AND NECK.  DO NOT [name] IS NOT RESPONSIBLE FOR VALUABLES.  CONTACTS, DENTURES OR PARTIALS MAY NOT BE WORN TO SURGERY. LEAVE SUITCASE IN CAR. CAN BE BROUGHT TO ROOM AFTER SURGERY.  PATIENTS DISCHARGED THE DAY OF SURGERY WILL NOT BE ALLOWED TO DRIVE HOME.  PLEASE READ OVER THE FOLLOWING INSTRUCTION  SHEETS _________________________________________________________________________________                                          [facility] - PREPARING FOR SURGERY  Before surgery, you can play an important role.  Because skin is not sterile, your  skin needs to be as free of germs as possible.  You can reduce the number of germs on your skin by washing with CHG (chlorahexidine gluconate) soap before surgery.  CHG is an antiseptic cleaner which kills germs and bonds with the skin to continue killing germs even after washing. Please DO NOT use if you have an allergy to CHG or antibacterial soaps.  If your skin becomes reddened/irritated stop using the CHG and inform your nurse when you arrive at Short Stay. Do not shave (including legs and underarms) for at least 48 hours prior to the first CHG shower.  You may shave your face. Please follow these instructions carefully:   1.  Shower with CHG Soap the night before surgery and the  morning of Surgery.   2.  If you choose to wash your hair, wash your hair first as usual with your  normal  Shampoo.   3.  After you shampoo, rinse your hair and body thoroughly to remove the  shampoo.                                         4.  Use CHG as you would any other liquid soap.  You can apply chg directly  to the skin and wash . Gently wash with scrungie or clean wascloth    5.  Apply the CHG Soap to your body ONLY FROM THE NECK DOWN.   Do not use on open                           Wound or open sores. Avoid contact with eyes, ears mouth and genitals (private parts).                        Genitals (private parts) with your normal soap.              6.  Wash thoroughly, paying special attention to the area where your surgery  will be performed.   7.  Thoroughly rinse your body with warm water from the neck down.   8.  DO NOT shower/wash with your normal soap after using and rinsing off  the CHG Soap .                9.  Pat yourself dry with a clean  towel.             10.  Wear clean night clothes to bed after shower             11.  Place clean sheets on your bed the night of your first shower and do not  sleep with pets.  Day of Surgery : Do not apply any lotions/deodorants the morning of surgery.  Please wear clean clothes to the hospital/surgery center.  FAILURE TO FOLLOW THESE INSTRUCTIONS MAY RESULT IN THE CANCELLATION OF YOUR SURGERY    PATIENT SIGNATURE_________________________________  ______________________________________________________________________     [name]  An incentive spirometer is a tool that can help keep your lungs clear and active. This tool measures how well you are filling your lungs with each breath. Taking long deep breaths may help reverse or decrease the chance of developing breathing (pulmonary) problems (especially infection) following:  A long period of time when you are unable to move or be active. BEFORE THE PROCEDURE  If the spirometer includes an indicator to show your best effort, your nurse or respiratory therapist will set it to a desired goal.  If possible, sit up straight or lean slightly forward. Try not to slouch.  Hold the incentive spirometer in an upright position. INSTRUCTIONS FOR USE   Sit on the edge of your bed if possible, or sit up as far as you can in bed or on a chair.  Hold the incentive spirometer in an upright position.  Breathe out normally.  Place the mouthpiece in your mouth and seal your lips tightly around it.  Breathe in slowly and as deeply as possible, raising the piston or the ball toward the top of the column.  Hold your breath for 3-5 seconds or for as long as possible. Allow the piston or ball to fall to the bottom of the column.  Remove the mouthpiece from your mouth and breathe out normally.  Rest for a few seconds and repeat Steps 1 through 7 at least 10 times every 1-2 hours when you are awake. Take your time and take a few  normal breaths between deep breaths.  The spirometer may include an indicator to show your best effort. Use the indicator as a goal to work toward during each repetition.  After each set of 10 deep breaths, practice coughing to be sure your lungs are clear. If you have an incision (the cut made at the time of surgery), support your incision when coughing by placing a pillow or rolled up towels firmly against it. Once you are able to get out of bed, walk around indoors and cough well. You may stop using the incentive spirometer when instructed by your caregiver.  RISKS AND COMPLICATIONS  Take your time so you do not get dizzy or light-headed.  If you are in pain, you may need to take or ask for pain medication before doing incentive spirometry. It is harder to take a deep breath if you are having pain. AFTER USE  Rest and breathe slowly and easily.  It can be helpful to keep track of a log of your progress. Your caregiver can provide you with a simple table to help with this. If you are using the spirometer at home, follow these instructions: [place] IF:   You are having difficultly using the spirometer.  You have trouble using the spirometer as often as instructed.  Your pain medication is not giving enough relief while using the spirometer.  You develop fever of 100.5 F (38.1 C) or higher. SEEK IMMEDIATE MEDICAL CARE IF:   You cough up bloody sputum that had not been present before.  You develop fever of 102 F (38.9 C) or greater.  You develop worsening pain at or near the incision site. MAKE SURE YOU:   Understand these instructions.  Will watch your condition.  Will get help right away if you are not doing well or get worse. Document Released: 01/19/2007 Document Revised: 12/01/2011 Document Reviewed: 03/22/2007 Exit[employer] Patient Information 2014 [employer], [place].   ________________________________________________________________________  WHAT IS A BLOOD  TRANSFUSION? Blood Transfusion Information  A transfusion is the replacement of blood or some of its parts. Blood is made up of multiple cells which provide different functions.  Red blood cells carry oxygen and are used for blood loss replacement.  White blood cells fight against infection.  Platelets control bleeding.  Plasma helps clot blood.  Other blood products are available for specialized needs, such as hemophilia or other clotting  disorders. BEFORE THE TRANSFUSION  Who gives blood for transfusions?   Healthy volunteers who are fully evaluated to make sure their blood is safe. This is blood bank blood. Transfusion therapy is the safest it has ever been in the practice of medicine. Before blood is taken from a donor, a complete history is taken to make sure that person has no history of diseases nor engages in risky social behavior (examples are intravenous drug use or sexual activity with multiple partners). The donor's travel history is screened to minimize risk of transmitting infections, such as malaria. The donated blood is tested for signs of infectious diseases, such as HIV and hepatitis. The blood is then tested to be sure it is compatible with you in order to minimize the chance of a transfusion reaction. If you or a relative donates blood, this is often done in anticipation of surgery and is not appropriate for emergency situations. It takes many days to process the donated blood. RISKS AND COMPLICATIONS Although transfusion therapy is very safe and saves many lives, the main dangers of transfusion include:   Getting an infectious disease.  Developing a transfusion reaction. This is an allergic reaction to something in the blood you were given. Every precaution is taken to prevent this. The decision to have a blood transfusion has been considered carefully by your caregiver before blood is given. Blood is not given unless the benefits outweigh the risks. AFTER THE  TRANSFUSION  Right after receiving a blood transfusion, you will usually feel much better and more energetic. This is especially true if your red blood cells have gotten low (anemic). The transfusion raises the level of the red blood cells which carry oxygen, and this usually causes an energy increase.  The nurse administering the transfusion will monitor you carefully for complications. HOME CARE INSTRUCTIONS  No special instructions are needed after a transfusion. You may find your energy is better. Speak with your caregiver about any limitations on activity for underlying diseases you may have. SEEK MEDICAL CARE IF:   Your condition is not improving after your transfusion.  You develop redness or irritation at the intravenous (IV) site. SEEK IMMEDIATE MEDICAL CARE IF:  Any of the following symptoms occur over the next 12 hours:  Shaking chills.  You have a temperature by mouth above 102 F (38.9 C), not controlled by medicine.  Chest, back, or muscle pain.  People around you feel you are not acting correctly or are confused.  Shortness of breath or difficulty breathing.  Dizziness and fainting.  You get a rash or develop hives.  You have a decrease in urine output.  Your urine turns a dark color or changes to pink, red, or brown. Any of the following symptoms occur over the next 10 days:  You have a temperature by mouth above 102 F (38.9 C), not controlled by medicine.  Shortness of breath.  Weakness after normal activity.  The white part of the eye turns yellow (jaundice).  You have a decrease in the amount of urine or are urinating less often.  Your urine turns a dark color or changes to pink, red, or brown. Document Released: 09/05/2000 Document Revised: 12/01/2011 Document Reviewed: 04/24/2008 [facility] Patient Information 2014 [place], [place].  _______________________________________________________________________

## 2015-08-03 ENCOUNTER — Encounter (INDEPENDENT_AMBULATORY_CARE_PROVIDER_SITE_OTHER): Payer: Self-pay

## 2015-08-03 ENCOUNTER — Encounter (HOSPITAL_COMMUNITY)
Admission: RE | Admit: 2015-08-03 | Discharge: 2015-08-03 | Disposition: A | Discharge status: Home / Self Care | Payer: Medicare Other | Source: Ambulatory Visit | Attending: Orthopedic Surgery | Admitting: Orthopedic Surgery

## 2015-08-03 ENCOUNTER — Encounter (HOSPITAL_COMMUNITY): Payer: Self-pay

## 2015-08-03 DIAGNOSIS — I6523 Occlusion and stenosis of bilateral carotid arteries: Secondary | ICD-10-CM | POA: Diagnosis not present

## 2015-08-03 DIAGNOSIS — F17210 Nicotine dependence, cigarettes, uncomplicated: Secondary | ICD-10-CM | POA: Diagnosis not present

## 2015-08-03 DIAGNOSIS — E039 Hypothyroidism, unspecified: Secondary | ICD-10-CM | POA: Diagnosis not present

## 2015-08-03 DIAGNOSIS — F330 Major depressive disorder, recurrent, mild: Secondary | ICD-10-CM | POA: Diagnosis not present

## 2015-08-03 DIAGNOSIS — Z01818 Encounter for other preprocedural examination: Secondary | ICD-10-CM | POA: Insufficient documentation

## 2015-08-03 DIAGNOSIS — I10 Essential (primary) hypertension: Secondary | ICD-10-CM | POA: Diagnosis not present

## 2015-08-03 HISTORY — DX: Sleep apnea, unspecified: G47.30

## 2015-08-03 HISTORY — DX: Hypothyroidism, unspecified: E03.9

## 2015-08-03 LAB — URINALYSIS, ROUTINE W REFLEX MICROSCOPIC
BILIRUBIN URINE: NEGATIVE
Glucose, UA: NEGATIVE mg/dL
Hgb urine dipstick: NEGATIVE
KETONES UR: NEGATIVE mg/dL
LEUKOCYTES UA: NEGATIVE
NITRITE: NEGATIVE
PROTEIN: NEGATIVE mg/dL
Specific Gravity, Urine: 1.013 (ref 1.005–1.030)
Urobilinogen, UA: 1 mg/dL (ref 0.0–1.0)
pH: 5.5 (ref 5.0–8.0)

## 2015-08-03 LAB — SURGICAL PCR SCREEN
MRSA, PCR: NEGATIVE
Staphylococcus aureus: NEGATIVE

## 2015-08-03 LAB — BASIC METABOLIC PANEL
Anion gap: 6 (ref 5–15)
BUN: 14 mg/dL (ref 6–20)
CHLORIDE: 107 mmol/L (ref 101–111)
CO2: 28 mmol/L (ref 22–32)
Calcium: 10.1 mg/dL (ref 8.9–10.3)
Creatinine, Ser: 0.89 mg/dL (ref 0.44–1.00)
GFR calc Af Amer: >60 mL/min (ref >60)
GLUCOSE: 111 mg/dL — AB (ref 65–99)
POTASSIUM: 4.3 mmol/L (ref 3.5–5.1)
Sodium: 141 mmol/L (ref 135–145)

## 2015-08-03 LAB — CBC
HEMATOCRIT: 40.3 % (ref 36.0–46.0)
HEMOGLOBIN: 13.5 g/dL (ref 12.0–15.0)
MCH: 31.7 pg (ref 26.0–34.0)
MCHC: 33.5 g/dL (ref 30.0–36.0)
MCV: 94.6 fL (ref 78.0–100.0)
Platelets: 131 10*3/uL — ABNORMAL LOW (ref 150–400)
RBC: 4.26 MIL/uL (ref 3.87–5.11)
RDW: 13.4 % (ref 11.5–15.5)
WBC: 6.6 10*3/uL (ref 4.0–10.5)

## 2015-08-03 LAB — PROTIME-INR
INR: 1.02 (ref 0.00–1.49)
Prothrombin Time: 13.6 seconds (ref 11.6–15.2)

## 2015-08-03 LAB — APTT: APTT: 36 s (ref 24–37)

## 2015-08-05 NOTE — H&P (Signed)
TOTAL KNEE REVISION ADMISSION H&P  Patient is being admitted for right revision total knee arthroplasty.  Subjective:  Chief Complaint:    Right knee pain s/p TKA  HPI: [name], 74 y.o. 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 The indications for the revision of the total knee arthroplasty are mechanical failure of one or components. Onset of symptoms was gradual starting years ago with gradually worsening course since that time.  Prior procedures on the right knee(s) include arthroplasty.  Patient currently rates pain in the right knee(s) at 9 out of 10 with activity. There is night pain, worsening of pain with activity and weight bearing, pain that interferes with activities of daily living and pain with passive range of motion.  Patient has evidence of previous TKA by imaging studies. This condition presents safety issues increasing the risk of falls.  There is no current active infection.  Risks, benefits and expectations were discussed with the patient.  Risks including but not limited to the risk of anesthesia, blood clots, nerve damage, blood vessel damage, failure of the prosthesis, infection and up to and including death.  Patient understand the risks, benefits and expectations and wishes to proceed with surgery.  PCP: Lavera Guise, MD  D/C Plans:      Home with HHPT/SNF  Post-op Meds:       No Rx given  Tranexamic Acid:      To be given - IV   Decadron:      Is to be given  FYI:     ASA post-op  Oxycodone post-op    Past Medical History  Diagnosis Date  . Hypertension   . Thyroid disease   . Anemia   . Allergy   . Sciatica   . Cancer ([place])     skin cancer  . Sleep apnea   . Hypothyroidism     Past Surgical History  Procedure Laterality Date   . Knee surgery Bilateral   . Knee surgery    . Colonoscopy    . Appendectomy    . Abdominal hysterectomy    . Bilateral carpal tunnel release    . Joint replacement    . Back surgery      No prescriptions prior to admission   Allergies  Allergen Reactions  . Codeine Nausea Only    Tolerates oxycodone  . Morphine Nausea And Vomiting  . Adhesive [Tape] Other (See Comments) and Rash    whelps    Social History  Substance Use Topics  . Smoking status: Current Every Day Smoker -- 0.50 packs/day for 20 years  . Smokeless tobacco: Not on file  . Alcohol Use: No       Review of Systems  Constitutional: Negative.   HENT: Negative.   Eyes: Negative.   Respiratory: Negative.   Cardiovascular: Negative.   Gastrointestinal: Negative.   Genitourinary: Negative.   Musculoskeletal: Positive for joint pain.  Skin: Negative.   Neurological: Negative.   Endo/Heme/Allergies: Positive for environmental allergies.  Psychiatric/Behavioral: Negative.      Objective:  Physical Exam  Constitutional: She is oriented to person, place, and time. She appears well-developed and well-nourished.  HENT:  Head: Normocephalic and atraumatic.  Eyes: Pupils are equal, round, and reactive to light.  Neck: Neck supple. No JVD present. No tracheal deviation  present. No thyromegaly present.  Cardiovascular: Normal rate, regular rhythm, normal heart sounds and intact distal pulses.   Respiratory: Effort normal and breath sounds normal. No stridor. No respiratory distress. She has no wheezes.  GI: Soft. There is no tenderness. There is no guarding.  Musculoskeletal:       Right knee: She exhibits decreased range of motion, swelling, laceration (previous healed incision) and bony tenderness. She exhibits no ecchymosis, no deformity and no erythema. Tenderness found.  Lymphadenopathy:    She has no cervical adenopathy.  Neurological: She is alert and oriented to person, place, and time.  Skin: Skin is  warm and dry.  Psychiatric: She has a normal mood and affect.      Labs:  Estimated body mass index is 31.18 kg/(m^2) as calculated from the following:   Height as of 02/24/14: 5\' 3"  (1.6 m).   Weight as of 02/24/14: 79.833 kg (176 lb).  Imaging Review Plain radiographs demonstrate previous TKA of the right knee(s). The overall alignment is neutral.  The bone quality appears to be good for age and reported activity level.   Assessment/Plan:  Right knee with failed previous arthroplasty.   The patient history, physical examination, clinical judgment of the provider and imaging studies are consistent with right knee, previous total knee arthroplasty. Revision total knee arthroplasty is deemed medically necessary. The treatment options including medical management, injection therapy, arthroscopy and revision arthroplasty were discussed at length. The risks and benefits of revision total knee arthroplasty were presented and reviewed. The risks due to aseptic loosening, infection, stiffness, patella tracking problems, thromboembolic complications and other imponderables were discussed. The patient acknowledged the explanation, agreed to proceed with the plan and consent was signed. Patient is being admitted for inpatient treatment for surgery, pain control, PT, OT, prophylactic antibiotics, VTE prophylaxis, progressive ambulation and ADL's and discharge planning.The patient is planning to be discharged home with home health services/SNF.     [place] Sarika Baldini   PA-C  08/05/2015, 10:59 PM

## 2015-08-08 NOTE — Progress Notes (Signed)
Confirmed EKG per chart 08/03/2015

## 2015-08-13 ENCOUNTER — Inpatient Hospital Stay (HOSPITAL_COMMUNITY): Payer: Medicare Other | Admitting: Certified Registered"

## 2015-08-13 ENCOUNTER — Inpatient Hospital Stay (HOSPITAL_COMMUNITY)
Admission: RE | Admit: 2015-08-13 | Discharge: 2015-08-15 | DRG: 468 | Disposition: A | Discharge status: Home Health | Payer: Medicare Other | Source: Ambulatory Visit | Attending: Orthopedic Surgery | Admitting: Orthopedic Surgery

## 2015-08-13 ENCOUNTER — Encounter (HOSPITAL_COMMUNITY)
Admission: RE | Disposition: A | Discharge status: Home Health | Payer: Self-pay | Source: Ambulatory Visit | Attending: Orthopedic Surgery

## 2015-08-13 ENCOUNTER — Encounter (HOSPITAL_COMMUNITY): Payer: Self-pay | Admitting: *Deleted

## 2015-08-13 DIAGNOSIS — D649 Anemia, unspecified: Secondary | ICD-10-CM | POA: Diagnosis present

## 2015-08-13 DIAGNOSIS — T84032A Mechanical loosening of internal right knee prosthetic joint, initial encounter: Secondary | ICD-10-CM | POA: Diagnosis present

## 2015-08-13 DIAGNOSIS — Z6831 Body mass index (BMI) 31.0-31.9, adult: Secondary | ICD-10-CM | POA: Diagnosis not present

## 2015-08-13 DIAGNOSIS — Z96652 Presence of left artificial knee joint: Secondary | ICD-10-CM

## 2015-08-13 DIAGNOSIS — E669 Obesity, unspecified: Secondary | ICD-10-CM | POA: Diagnosis present

## 2015-08-13 DIAGNOSIS — Y838 Other surgical procedures as the cause of abnormal reaction of the patient, or of later complication, without mention of misadventure at the time of the procedure: Secondary | ICD-10-CM | POA: Diagnosis present

## 2015-08-13 DIAGNOSIS — I10 Essential (primary) hypertension: Secondary | ICD-10-CM | POA: Diagnosis present

## 2015-08-13 DIAGNOSIS — F17200 Nicotine dependence, unspecified, uncomplicated: Secondary | ICD-10-CM | POA: Diagnosis present

## 2015-08-13 DIAGNOSIS — Z96651 Presence of right artificial knee joint: Secondary | ICD-10-CM

## 2015-08-13 DIAGNOSIS — T8489XA Other specified complication of internal orthopedic prosthetic devices, implants and grafts, initial encounter: Secondary | ICD-10-CM | POA: Diagnosis not present

## 2015-08-13 DIAGNOSIS — G4730 Sleep apnea, unspecified: Secondary | ICD-10-CM | POA: Diagnosis present

## 2015-08-13 DIAGNOSIS — G8918 Other acute postprocedural pain: Secondary | ICD-10-CM | POA: Diagnosis not present

## 2015-08-13 DIAGNOSIS — M25561 Pain in right knee: Secondary | ICD-10-CM | POA: Diagnosis not present

## 2015-08-13 DIAGNOSIS — E039 Hypothyroidism, unspecified: Secondary | ICD-10-CM | POA: Diagnosis present

## 2015-08-13 HISTORY — PX: TOTAL KNEE REVISION: SHX996

## 2015-08-13 LAB — TYPE AND SCREEN
ABO/RH(D): A POS
Antibody Screen: NEGATIVE

## 2015-08-13 SURGERY — TOTAL KNEE REVISION
Anesthesia: General | Site: Knee | Laterality: Right

## 2015-08-13 MED ORDER — MIDAZOLAM HCL 5 MG/ML IJ SOLN
1.0000 mg | INTRAMUSCULAR | Status: DC | PRN
  Administered 2015-08-13: 1 mg via INTRAVENOUS

## 2015-08-13 MED ORDER — GLYCOPYRROLATE 0.2 MG/ML IJ SOLN
INTRAMUSCULAR | Status: AC
Start: 2015-08-13 — End: 2015-08-13
  Filled 2015-08-13: qty 2

## 2015-08-13 MED ORDER — ONDANSETRON HCL 4 MG/2ML IJ SOLN
4.0000 mg | Freq: Four times a day (QID) | INTRAMUSCULAR | Status: DC | PRN
Start: 2015-08-13 — End: 2015-08-15

## 2015-08-13 MED ORDER — BUPIVACAINE-EPINEPHRINE (PF) 0.25% -1:200000 IJ SOLN
INTRAMUSCULAR | Status: AC
  Filled 2015-08-13: qty 30

## 2015-08-13 MED ORDER — KETOROLAC TROMETHAMINE 30 MG/ML IJ SOLN
INTRAMUSCULAR | Status: DC | PRN
  Administered 2015-08-13: 30 mg via INTRAMUSCULAR

## 2015-08-13 MED ORDER — BUPIVACAINE-EPINEPHRINE (PF) 0.5% -1:200000 IJ SOLN
INTRAMUSCULAR | Status: AC
  Filled 2015-08-13: qty 30

## 2015-08-13 MED ORDER — OXYCODONE HCL 5 MG PO TABS
5.0000 mg | ORAL_TABLET | ORAL | Status: DC
  Administered 2015-08-13: 10 mg via ORAL
  Administered 2015-08-14: 5 mg via ORAL
  Administered 2015-08-14 (×2): 10 mg via ORAL
  Administered 2015-08-14: 5 mg via ORAL
  Administered 2015-08-14 – 2015-08-15 (×3): 10 mg via ORAL
  Filled 2015-08-13: qty 1
  Filled 2015-08-13: qty 2
  Filled 2015-08-13: qty 1
  Filled 2015-08-13 (×5): qty 2

## 2015-08-13 MED ORDER — OXYCODONE HCL 5 MG PO TABA: 5.0000 mg | ORAL_TABLET | Freq: Two times a day (BID) | ORAL | Status: DC

## 2015-08-13 MED ORDER — GLYCOPYRROLATE 0.2 MG/ML IJ SOLN
INTRAMUSCULAR | Status: DC | PRN
  Administered 2015-08-13: 0.4 mg via INTRAVENOUS

## 2015-08-13 MED ORDER — FERROUS SULFATE 325 (65 FE) MG PO TABS
325.0000 mg | ORAL_TABLET | Freq: Three times a day (TID) | ORAL | Status: DC
  Administered 2015-08-14 (×3): 325 mg via ORAL
  Filled 2015-08-13 (×7): qty 1

## 2015-08-13 MED ORDER — KETOROLAC TROMETHAMINE 30 MG/ML IJ SOLN
INTRAMUSCULAR | Status: AC
  Filled 2015-08-13: qty 1

## 2015-08-13 MED ORDER — HYDROMORPHONE HCL 2 MG/ML IJ SOLN
INTRAMUSCULAR | Status: AC
  Filled 2015-08-13: qty 1

## 2015-08-13 MED ORDER — FENTANYL CITRATE (PF) 100 MCG/2ML IJ SOLN
INTRAMUSCULAR | Status: AC
  Filled 2015-08-13: qty 2

## 2015-08-13 MED ORDER — CELECOXIB 200 MG PO CAPS
200.0000 mg | ORAL_CAPSULE | Freq: Two times a day (BID) | ORAL | Status: DC
  Administered 2015-08-13 – 2015-08-15 (×4): 200 mg via ORAL
  Filled 2015-08-13 (×6): qty 1

## 2015-08-13 MED ORDER — MIDAZOLAM HCL 2 MG/2ML IJ SOLN
INTRAMUSCULAR | Status: AC
  Filled 2015-08-13: qty 2

## 2015-08-13 MED ORDER — PHENOL 1.4 % MT LIQD: 1.0000 | OROMUCOSAL | Status: DC | PRN

## 2015-08-13 MED ORDER — DEXAMETHASONE SODIUM PHOSPHATE 10 MG/ML IJ SOLN
10.0000 mg | Freq: Once | INTRAMUSCULAR | Status: AC
  Administered 2015-08-14: 10 mg via INTRAVENOUS
  Filled 2015-08-13: qty 1

## 2015-08-13 MED ORDER — TRAZODONE HCL 50 MG PO TABS
50.0000 mg | ORAL_TABLET | Freq: Every day | ORAL | Status: DC
  Administered 2015-08-13 – 2015-08-14 (×2): 50 mg via ORAL
  Filled 2015-08-13 (×4): qty 1

## 2015-08-13 MED ORDER — LIDOCAINE HCL (CARDIAC) 20 MG/ML IV SOLN
INTRAVENOUS | Status: DC | PRN
  Administered 2015-08-13: 50 mg via INTRAVENOUS

## 2015-08-13 MED ORDER — HYDROMORPHONE HCL 1 MG/ML IJ SOLN
INTRAMUSCULAR | Status: DC | PRN
  Administered 2015-08-13 (×2): 1 mg via INTRAVENOUS

## 2015-08-13 MED ORDER — DEXAMETHASONE SODIUM PHOSPHATE 10 MG/ML IJ SOLN
10.0000 mg | Freq: Once | INTRAMUSCULAR | Status: AC
  Administered 2015-08-13: 10 mg via INTRAVENOUS

## 2015-08-13 MED ORDER — PROPOFOL 10 MG/ML IV BOLUS
INTRAVENOUS | Status: AC
  Filled 2015-08-13: qty 20

## 2015-08-13 MED ORDER — HYDROMORPHONE HCL 1 MG/ML IJ SOLN
0.2500 mg | INTRAMUSCULAR | Status: DC | PRN
  Administered 2015-08-13 (×4): 0.5 mg via INTRAVENOUS

## 2015-08-13 MED ORDER — FENTANYL CITRATE (PF) 250 MCG/5ML IJ SOLN
INTRAMUSCULAR | Status: AC
  Filled 2015-08-13: qty 5

## 2015-08-13 MED ORDER — TRANEXAMIC ACID 1000 MG/10ML IV SOLN
1000.0000 mg | Freq: Once | INTRAVENOUS | Status: AC
  Administered 2015-08-13: 1000 mg via INTRAVENOUS
  Filled 2015-08-13: qty 10

## 2015-08-13 MED ORDER — ALPRAZOLAM 0.5 MG PO TABS: 0.5000 mg | ORAL_TABLET | Freq: Every evening | ORAL | Status: DC | PRN

## 2015-08-13 MED ORDER — BUPIVACAINE HCL (PF) 0.5 % IJ SOLN
INTRAMUSCULAR | Status: DC | PRN
  Administered 2015-08-13: 30 mL via PERINEURAL

## 2015-08-13 MED ORDER — LIDOCAINE HCL (CARDIAC) 20 MG/ML IV SOLN
INTRAVENOUS | Status: AC
  Filled 2015-08-13: qty 5

## 2015-08-13 MED ORDER — CITALOPRAM HYDROBROMIDE 20 MG PO TABS
20.0000 mg | ORAL_TABLET | Freq: Every day | ORAL | Status: DC
  Administered 2015-08-14 – 2015-08-15 (×2): 20 mg via ORAL
  Filled 2015-08-13 (×2): qty 1

## 2015-08-13 MED ORDER — SODIUM CHLORIDE 0.9 % IV SOLN
INTRAVENOUS | Status: DC
  Administered 2015-08-13 – 2015-08-14 (×2): via INTRAVENOUS
  Filled 2015-08-13 (×6): qty 1000

## 2015-08-13 MED ORDER — METOCLOPRAMIDE HCL 10 MG PO TABS: 5.0000 mg | ORAL_TABLET | Freq: Three times a day (TID) | ORAL | Status: DC | PRN

## 2015-08-13 MED ORDER — HYDROMORPHONE HCL 1 MG/ML IJ SOLN
INTRAMUSCULAR | Status: AC
  Filled 2015-08-13: qty 1

## 2015-08-13 MED ORDER — BISACODYL 10 MG RE SUPP: 10.0000 mg | Freq: Every day | RECTAL | Status: DC | PRN

## 2015-08-13 MED ORDER — METHOCARBAMOL 1000 MG/10ML IJ SOLN
500.0000 mg | Freq: Four times a day (QID) | INTRAMUSCULAR | Status: DC | PRN
  Administered 2015-08-13: 500 mg via INTRAVENOUS
  Filled 2015-08-13 (×3): qty 5

## 2015-08-13 MED ORDER — LORATADINE 10 MG PO TABS
10.0000 mg | ORAL_TABLET | Freq: Every day | ORAL | Status: DC
  Administered 2015-08-14 – 2015-08-15 (×2): 10 mg via ORAL
  Filled 2015-08-13 (×2): qty 1

## 2015-08-13 MED ORDER — DEXAMETHASONE SODIUM PHOSPHATE 10 MG/ML IJ SOLN
INTRAMUSCULAR | Status: AC
  Filled 2015-08-13: qty 1

## 2015-08-13 MED ORDER — MIDAZOLAM HCL 5 MG/5ML IJ SOLN
INTRAMUSCULAR | Status: DC | PRN
  Administered 2015-08-13 (×2): 1 mg via INTRAVENOUS

## 2015-08-13 MED ORDER — LEVOTHYROXINE SODIUM 125 MCG PO TABS
125.0000 ug | ORAL_TABLET | Freq: Every day | ORAL | Status: DC
  Administered 2015-08-14 – 2015-08-15 (×2): 125 ug via ORAL
  Filled 2015-08-13 (×3): qty 1

## 2015-08-13 MED ORDER — ONDANSETRON HCL 4 MG/2ML IJ SOLN
INTRAMUSCULAR | Status: AC
  Filled 2015-08-13: qty 2

## 2015-08-13 MED ORDER — MAGNESIUM CITRATE PO SOLN: 1.0000 | Freq: Once | ORAL | Status: DC | PRN

## 2015-08-13 MED ORDER — METHOCARBAMOL 500 MG PO TABS
500.0000 mg | ORAL_TABLET | Freq: Four times a day (QID) | ORAL | Status: DC | PRN
  Administered 2015-08-14 – 2015-08-15 (×3): 500 mg via ORAL
  Filled 2015-08-13 (×3): qty 1

## 2015-08-13 MED ORDER — FUROSEMIDE 20 MG PO TABS
20.0000 mg | ORAL_TABLET | Freq: Every day | ORAL | Status: DC
  Administered 2015-08-14 – 2015-08-15 (×2): 20 mg via ORAL
  Filled 2015-08-13 (×2): qty 1

## 2015-08-13 MED ORDER — ROCURONIUM BROMIDE 100 MG/10ML IV SOLN
INTRAVENOUS | Status: AC
  Filled 2015-08-13: qty 1

## 2015-08-13 MED ORDER — DIPHENHYDRAMINE HCL 25 MG PO CAPS: 25.0000 mg | ORAL_CAPSULE | Freq: Four times a day (QID) | ORAL | Status: DC | PRN

## 2015-08-13 MED ORDER — LIP MEDEX EX OINT
TOPICAL_OINTMENT | CUTANEOUS | Status: AC
  Administered 2015-08-13: 19:00:00
  Filled 2015-08-13: qty 7

## 2015-08-13 MED ORDER — NEOSTIGMINE METHYLSULFATE 10 MG/10ML IV SOLN
INTRAVENOUS | Status: DC | PRN
Start: 2015-08-13 — End: 2015-08-13
  Administered 2015-08-13: 3 mg via INTRAVENOUS

## 2015-08-13 MED ORDER — POLYETHYLENE GLYCOL 3350 17 G PO PACK
17.0000 g | PACK | Freq: Two times a day (BID) | ORAL | Status: DC
  Administered 2015-08-14 – 2015-08-15 (×2): 17 g via ORAL

## 2015-08-13 MED ORDER — CEFAZOLIN SODIUM-DEXTROSE 2-3 GM-% IV SOLR
2.0000 g | INTRAVENOUS | Status: AC
  Administered 2015-08-13: 2 g via INTRAVENOUS

## 2015-08-13 MED ORDER — ALUM & MAG HYDROXIDE-SIMETH 200-200-20 MG/5ML PO SUSP
30.0000 mL | ORAL | Status: DC | PRN
Start: 2015-08-13 — End: 2015-08-15

## 2015-08-13 MED ORDER — SODIUM CHLORIDE 0.9 % IJ SOLN
INTRAMUSCULAR | Status: AC
  Filled 2015-08-13: qty 50

## 2015-08-13 MED ORDER — GEMFIBROZIL 600 MG PO TABS
600.0000 mg | ORAL_TABLET | Freq: Two times a day (BID) | ORAL | Status: DC
  Administered 2015-08-14 – 2015-08-15 (×3): 600 mg via ORAL
  Filled 2015-08-13 (×5): qty 1

## 2015-08-13 MED ORDER — METOCLOPRAMIDE HCL 5 MG/ML IJ SOLN: 5.0000 mg | Freq: Three times a day (TID) | INTRAMUSCULAR | Status: DC | PRN

## 2015-08-13 MED ORDER — SIMVASTATIN 40 MG PO TABS
40.0000 mg | ORAL_TABLET | Freq: Every day | ORAL | Status: DC
  Administered 2015-08-14 – 2015-08-15 (×2): 40 mg via ORAL
  Filled 2015-08-13 (×2): qty 1

## 2015-08-13 MED ORDER — ROCURONIUM BROMIDE 100 MG/10ML IV SOLN
INTRAVENOUS | Status: DC | PRN
  Administered 2015-08-13: 40 mg via INTRAVENOUS

## 2015-08-13 MED ORDER — ONDANSETRON HCL 4 MG PO TABS: 4.0000 mg | ORAL_TABLET | Freq: Four times a day (QID) | ORAL | Status: DC | PRN

## 2015-08-13 MED ORDER — ASPIRIN EC 325 MG PO TBEC
325.0000 mg | DELAYED_RELEASE_TABLET | Freq: Two times a day (BID) | ORAL | Status: DC
  Administered 2015-08-14 – 2015-08-15 (×3): 325 mg via ORAL
  Filled 2015-08-13 (×5): qty 1

## 2015-08-13 MED ORDER — CEFAZOLIN SODIUM-DEXTROSE 2-3 GM-% IV SOLR
2.0000 g | Freq: Four times a day (QID) | INTRAVENOUS | Status: AC
Start: 2015-08-13 — End: 2015-08-14
  Administered 2015-08-13 – 2015-08-14 (×2): 2 g via INTRAVENOUS
  Filled 2015-08-13 (×2): qty 50

## 2015-08-13 MED ORDER — LACTATED RINGERS IV SOLN
INTRAVENOUS | Status: DC
Start: 2015-08-13 — End: 2015-08-13
  Administered 2015-08-13: 1000 mL via INTRAVENOUS
  Administered 2015-08-13 (×2): via INTRAVENOUS

## 2015-08-13 MED ORDER — FENTANYL CITRATE (PF) 100 MCG/2ML IJ SOLN
50.0000 ug | INTRAMUSCULAR | Status: DC | PRN
  Administered 2015-08-13: 50 ug via INTRAVENOUS

## 2015-08-13 MED ORDER — PROMETHAZINE HCL 25 MG/ML IJ SOLN: 6.2500 mg | INTRAMUSCULAR | Status: DC | PRN

## 2015-08-13 MED ORDER — CHLORHEXIDINE GLUCONATE 4 % EX LIQD: 60.0000 mL | Freq: Once | CUTANEOUS | Status: DC

## 2015-08-13 MED ORDER — HYDROMORPHONE HCL 1 MG/ML IJ SOLN
0.5000 mg | INTRAMUSCULAR | Status: DC | PRN
  Administered 2015-08-13 – 2015-08-14 (×2): 1 mg via INTRAVENOUS
  Filled 2015-08-13 (×3): qty 1

## 2015-08-13 MED ORDER — CARVEDILOL 6.25 MG PO TABS
6.2500 mg | ORAL_TABLET | Freq: Two times a day (BID) | ORAL | Status: DC
  Administered 2015-08-14 – 2015-08-15 (×3): 6.25 mg via ORAL
  Filled 2015-08-13 (×6): qty 1

## 2015-08-13 MED ORDER — FENTANYL CITRATE (PF) 100 MCG/2ML IJ SOLN
INTRAMUSCULAR | Status: DC | PRN
  Administered 2015-08-13 (×2): 100 ug via INTRAVENOUS
  Administered 2015-08-13 (×3): 50 ug via INTRAVENOUS

## 2015-08-13 MED ORDER — MENTHOL 3 MG MT LOZG: 1.0000 | LOZENGE | OROMUCOSAL | Status: DC | PRN

## 2015-08-13 MED ORDER — DOCUSATE SODIUM 100 MG PO CAPS
100.0000 mg | ORAL_CAPSULE | Freq: Two times a day (BID) | ORAL | Status: DC
  Administered 2015-08-13 – 2015-08-15 (×4): 100 mg via ORAL

## 2015-08-13 MED ORDER — CEFAZOLIN SODIUM-DEXTROSE 2-3 GM-% IV SOLR
INTRAVENOUS | Status: AC
  Filled 2015-08-13: qty 50

## 2015-08-13 MED ORDER — PROPOFOL 10 MG/ML IV BOLUS
INTRAVENOUS | Status: DC | PRN
  Administered 2015-08-13: 160 mg via INTRAVENOUS

## 2015-08-13 MED ORDER — BUPIVACAINE-EPINEPHRINE 0.25% -1:200000 IJ SOLN
INTRAMUSCULAR | Status: DC | PRN
  Administered 2015-08-13: 30 mL

## 2015-08-13 MED ORDER — ONDANSETRON HCL 4 MG/2ML IJ SOLN
INTRAMUSCULAR | Status: DC | PRN
  Administered 2015-08-13: 4 mg via INTRAVENOUS

## 2015-08-13 MED ORDER — SODIUM CHLORIDE 0.9 % IJ SOLN
INTRAMUSCULAR | Status: DC | PRN
  Administered 2015-08-13: 30 mL

## 2015-08-13 SURGICAL SUPPLY — 73 items
ADAPTER BOLT FEMORAL +2/-2 (Knees) ×1 IMPLANT
ADPR FEM +2/-2 OFST BOLT (Knees) ×1 IMPLANT
ADPR FEM 5D STRL KN PFC SGM (Orthopedic Implant) ×1 IMPLANT
AUG TIB SZ2 10 REV STP WDG (Knees) ×1 IMPLANT
AUGMENT PFC SIG RP SZ2.5 12.5M (Knees) IMPLANT
BAG DECANTER FOR FLEXI CONT (MISCELLANEOUS) IMPLANT
BAG SPEC THK2 15X12 ZIP CLS (MISCELLANEOUS)
BAG ZIPLOCK 12X15 (MISCELLANEOUS) IMPLANT
BANDAGE ELASTIC 6 VELCRO ST LF (GAUZE/BANDAGES/DRESSINGS) ×2 IMPLANT
BLADE SAW SGTL 13.0X1.19X90.0M (BLADE) ×2 IMPLANT
BLADE SAW SGTL 81X20 HD (BLADE) ×2 IMPLANT
BONE CEMENT GENTAMICIN (Cement) ×6 IMPLANT
BRUSH FEMORAL CANAL (MISCELLANEOUS) IMPLANT
CEMENT BONE GENTAMICIN 40 (Cement) IMPLANT
CLOTH BEACON ORANGE TIMEOUT ST (SAFETY) ×2 IMPLANT
CUFF TOURN SGL QUICK 34 (TOURNIQUET CUFF) ×2
CUFF TRNQT CYL 34X4X40X1 (TOURNIQUET CUFF) ×1 IMPLANT
DRAPE U-SHAPE 47X51 STRL (DRAPES) ×2 IMPLANT
DRSG ADAPTIC 3X8 NADH LF (GAUZE/BANDAGES/DRESSINGS) IMPLANT
DRSG AQUACEL AG ADV 3.5X10 (GAUZE/BANDAGES/DRESSINGS) ×2 IMPLANT
DRSG AQUACEL AG ADV 3.5X14 (GAUZE/BANDAGES/DRESSINGS) ×1 IMPLANT
DRSG PAD ABDOMINAL 8X10 ST (GAUZE/BANDAGES/DRESSINGS) IMPLANT
DRSG TEGADERM 4X4.75 (GAUZE/BANDAGES/DRESSINGS) IMPLANT
DURAPREP 26ML APPLICATOR (WOUND CARE) ×3 IMPLANT
ELECT REM PT RETURN 9FT ADLT (ELECTROSURGICAL) ×2
ELECTRODE REM PT RTRN 9FT ADLT (ELECTROSURGICAL) ×1 IMPLANT
FEMORAL ADAPTER (Orthopedic Implant) ×1 IMPLANT
FEMUR SIGMA PS SZ 2.5 R (Femur) ×1 IMPLANT
GAUZE SPONGE 2X2 8PLY STRL LF (GAUZE/BANDAGES/DRESSINGS) IMPLANT
GAUZE SPONGE 4X4 12PLY STRL (GAUZE/BANDAGES/DRESSINGS) IMPLANT
GLOVE BIOGEL M 7.0 STRL (GLOVE) IMPLANT
GLOVE BIOGEL M STRL SZ7.5 (GLOVE) IMPLANT
GLOVE BIOGEL PI IND STRL 7.5 (GLOVE) ×1 IMPLANT
GLOVE BIOGEL PI IND STRL 8.5 (GLOVE) ×1 IMPLANT
GLOVE BIOGEL PI INDICATOR 7.5 (GLOVE) ×1
GLOVE BIOGEL PI INDICATOR 8.5 (GLOVE) ×1
GLOVE ECLIPSE 8.0 STRL XLNG CF (GLOVE) ×2 IMPLANT
GLOVE ORTHO TXT STRL SZ7.5 (GLOVE) ×4 IMPLANT
GOWN STRL REUS W/TWL LRG LVL3 (GOWN DISPOSABLE) ×4 IMPLANT
GOWN STRL REUS W/TWL XL LVL3 (GOWN DISPOSABLE) ×2 IMPLANT
HANDPIECE INTERPULSE COAX TIP (DISPOSABLE) ×2
IMMOBILIZER KNEE 20 (SOFTGOODS)
IMMOBILIZER KNEE 20 THIGH 36 (SOFTGOODS) IMPLANT
LIQUID BAND (GAUZE/BANDAGES/DRESSINGS) ×2 IMPLANT
MANIFOLD NEPTUNE II (INSTRUMENTS) ×2 IMPLANT
NS IRRIG 1000ML POUR BTL (IV SOLUTION) ×2 IMPLANT
PACK TOTAL KNEE CUSTOM (KITS) ×2 IMPLANT
PADDING CAST COTTON 6X4 STRL (CAST SUPPLIES) IMPLANT
PATELLA DOME PFC 38MM (Knees) ×1 IMPLANT
PFC SIGMA RP STB SZ 2.5 12.5M (Knees) ×2 IMPLANT
POSITIONER SURGICAL ARM (MISCELLANEOUS) ×2 IMPLANT
RESTRICTOR CEMENT SZ 5 C-STEM (Cement) ×2 IMPLANT
SET HNDPC FAN SPRY TIP SCT (DISPOSABLE) ×1 IMPLANT
SET PAD KNEE POSITIONER (MISCELLANEOUS) ×2 IMPLANT
SPONGE GAUZE 2X2 STER 10/PKG (GAUZE/BANDAGES/DRESSINGS)
SPONGE LAP 18X18 X RAY DECT (DISPOSABLE) IMPLANT
STAPLER VISISTAT 35W (STAPLE) IMPLANT
STEM TIBIA PFC 13X30MM (Stem) ×2 IMPLANT
SUT MNCRL AB 3-0 PS2 18 (SUTURE) ×2 IMPLANT
SUT VIC AB 1 CT1 36 (SUTURE) ×3 IMPLANT
SUT VIC AB 2-0 CT1 27 (SUTURE) ×6
SUT VIC AB 2-0 CT1 TAPERPNT 27 (SUTURE) ×3 IMPLANT
SUT VLOC 180 0 24IN GS25 (SUTURE) ×2 IMPLANT
SYR 50ML LL SCALE MARK (SYRINGE) ×2 IMPLANT
TOWER CARTRIDGE SMART MIX (DISPOSABLE) ×2 IMPLANT
TRAY FOLEY W/METER SILVER 14FR (SET/KITS/TRAYS/PACK) ×2 IMPLANT
TRAY FOLEY W/METER SILVER 16FR (SET/KITS/TRAYS/PACK) ×2 IMPLANT
TRAY REVISION SZ 2.5 (Knees) ×1 IMPLANT
TRAY SLEEVE CEM ML (Knees) ×1 IMPLANT
TUBE KAMVAC SUCTION (TUBING) IMPLANT
WATER STERILE IRR 1500ML POUR (IV SOLUTION) ×2 IMPLANT
WEDGE SZ 2.0MM (Knees) ×1 IMPLANT
WRAP KNEE MAXI GEL POST OP (GAUZE/BANDAGES/DRESSINGS) ×2 IMPLANT

## 2015-08-13 NOTE — Brief Op Note (Signed)
08/13/2015  2:43 PM  PATIENT:  [name]  74 y.o. 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  PROCEDURE:  Procedure(s): Revision right total knee replacment  SURGEON:  Surgeon(s) and Role:    * Paralee Cancel, MD - Primary  PHYSICIAN ASSISTANT: Danae Orleans, PA-C  ANESTHESIA:   regional and general  EBL:  Total I/O In: 2000 [I.V.:2000] Out: 1750 [Urine:1750]  BLOOD ADMINISTERED:none  DRAINS: none   LOCAL MEDICATIONS USED:  MARCAINE     SPECIMEN:  No Specimen  DISPOSITION OF SPECIMEN:  N/A  COUNTS:  YES  TOURNIQUET:   Total Tourniquet Time Documented: Thigh (Right) - 55 minutes Total: Thigh (Right) - 55 minutes   DICTATION: .Other Dictation: Dictation Number [id number]  PLAN OF CARE: Admit to inpatient   PATIENT DISPOSITION:  PACU - hemodynamically stable.   Delay start of Pharmacological VTE agent (>24hrs) due to surgical blood loss or risk of bleeding: no

## 2015-08-13 NOTE — Transfer of Care (Signed)
Immediate Anesthesia Transfer of Care Note  Patient: [name]  Procedure(s) Performed: Procedure(s): TOTAL KNEE REVISION (Right)  Patient Location: PACU  Anesthesia Type:General  Level of Consciousness:  sedated, patient cooperative and responds to stimulation  Airway & Oxygen Therapy:Patient Spontanous Breathing and Patient connected to face mask oxgen  Post-op Assessment:  Report given to PACU RN and Post -op Vital signs reviewed and stable  Post vital signs:  Reviewed and stable  Last Vitals:  Filed Vitals:   08/13/15 1246 08/13/15 1247  BP:    Pulse: 74 77  Temp:    Resp: 14 18    Complications: No apparent anesthesia complications

## 2015-08-13 NOTE — Progress Notes (Signed)
AssistedDr. Delma Post with right, adductor canal block. Side rails up, monitors on throughout procedure. See vital signs in flow sheet. Tolerated Procedure well.

## 2015-08-13 NOTE — Anesthesia Preprocedure Evaluation (Addendum)
Anesthesia Evaluation  Patient identified by MRN, date of birth, ID band Patient awake    Reviewed: Allergy & Precautions, NPO status , Patient's Chart, lab work & pertinent test results  Airway Mallampati: II  TM Distance: >3 FB Neck ROM: Full    Dental no notable dental hx.    Pulmonary sleep apnea , Current Smoker,    Pulmonary exam normal breath sounds clear to auscultation       Cardiovascular Exercise Tolerance: Good hypertension, Pt. on medications and Pt. on home beta blockers Normal cardiovascular exam Rhythm:Regular Rate:Normal     Neuro/Psych Spinal cord stimulator in place for sciatic pain.  Neuromuscular disease negative psych ROS   GI/Hepatic negative GI ROS, Neg liver ROS,   Endo/Other  Hypothyroidism   Renal/GU negative Renal ROS  negative genitourinary   Musculoskeletal negative musculoskeletal ROS (+)   Abdominal (+) + obese,   Peds negative pediatric ROS (+)  Hematology  (+) anemia ,   Anesthesia Other Findings   Reproductive/Obstetrics negative OB ROS                            Anesthesia Physical Anesthesia Plan  ASA: II  Anesthesia Plan: General   Post-op Pain Management: GA combined w/ Regional for post-op pain   Induction: Intravenous  Airway Management Planned: Oral ETT  Additional Equipment:   Intra-op Plan:   Post-operative Plan: Extubation in OR  Informed Consent: I have reviewed the patients History and Physical, chart, labs and discussed the procedure including the risks, benefits and alternatives for the proposed anesthesia with the patient or authorized representative who has indicated his/her understanding and acceptance.   Dental advisory given  Plan Discussed with: CRNA  Anesthesia Plan Comments: (Discussed risks of adductor canal block including failure, bleeding, infection, nerve damage.  Adductor canal block does not usually prevent  all pain. Specifically, it treats the anterior, but often not the posterior knee. Questions answered.  Patient consents to block.)       Anesthesia Quick Evaluation

## 2015-08-13 NOTE — Interval H&P Note (Signed)
History and Physical Interval Note:  08/13/2015 12:04 PM  [name]  has presented today for surgery, with the diagnosis of FAILED RIGHT TOTAL KNEE  The various methods of treatment have been discussed with the patient and family. After consideration of risks, benefits and other options for treatment, the patient has consented to  Procedure(s): TOTAL KNEE REVISION (Right) as a surgical intervention .  The patient's history has been reviewed, patient examined, no change in status, stable for surgery.  I have reviewed the patient's chart and labs.  Questions were answered to the patient's satisfaction.     Mauri Pole

## 2015-08-13 NOTE — Progress Notes (Signed)
Utilization review completed.  

## 2015-08-13 NOTE — Anesthesia Postprocedure Evaluation (Signed)
Anesthesia Post Note  Patient: [name]  Procedure(s) Performed: Procedure(s) (LRB): TOTAL KNEE REVISION (Right)  Patient location during evaluation: PACU Anesthesia Type: General and Regional Level of consciousness: awake and alert Pain management: pain level controlled Vital Signs Assessment: post-procedure vital signs reviewed and stable Respiratory status: spontaneous breathing, nonlabored ventilation, respiratory function stable and patient connected to nasal cannula oxygen Cardiovascular status: blood pressure returned to baseline and stable Postop Assessment: No signs of nausea or vomiting Anesthetic complications: no    Last Vitals:  Filed Vitals:   08/13/15 1650 08/13/15 1745  BP: 138/76 137/63  Pulse: 79 73  Temp: 36.9 C 36.6 C  Resp: 18 12    Last Pain:  Filed Vitals:   08/13/15 1745  PainSc: 3                  Courtenay Hirth J

## 2015-08-13 NOTE — Anesthesia Procedure Notes (Addendum)
Anesthesia Regional Block:  Adductor canal block  Pre-Anesthetic Checklist: ,, timeout performed, Correct Patient, Correct Site, Correct Laterality, Correct Procedure, Correct Position, site marked, Risks and benefits discussed,  Surgical consent,  Pre-op evaluation,  At surgeon's request and post-op pain management  Laterality: Lower and Right  Prep: chloraprep       Needles:  Injection technique: Single-shot  Needle Type: Echogenic Needle      Needle Gauge: 21 and 21 G    Additional Needles:  Procedures: ultrasound guided (picture in chart) Adductor canal block  Nerve Stimulator or Paresthesia:  Response: 0.6 mA,   Additional Responses:   Narrative:  Start time: 08/13/2015 12:08 PM Injection made incrementally with aspirations every 5 mL.  Performed by: Personally   Additional Notes: No pain on injection. No increased resistance to injection. Motor intact immediately after block. Loss of sensation to cold at 20 minutes. Meaningful verbal contact maintained throughout block placement.   Procedure Name: Intubation Performed by: Noralyn Pick D Pre-anesthesia Checklist: Patient identified, Emergency Drugs available, Suction available and Patient being monitored Patient Re-evaluated:Patient Re-evaluated prior to inductionOxygen Delivery Method: Circle System Utilized Preoxygenation: Pre-oxygenation with 100% oxygen Intubation Type: IV induction Ventilation: Mask ventilation without difficulty Laryngoscope Size: Mac and 4 Grade View: Grade I Tube type: Oral Tube size: 7.5 mm Number of attempts: 1 Airway Equipment and Method: Stylet and Oral airway Placement Confirmation: ETT inserted through vocal cords under direct vision,  positive ETCO2 and breath sounds checked- equal and bilateral Secured at: 21 cm Tube secured with: Tape Dental Injury: Teeth and Oropharynx as per pre-operative assessment

## 2015-08-14 ENCOUNTER — Encounter (HOSPITAL_COMMUNITY): Payer: Self-pay | Admitting: Orthopedic Surgery

## 2015-08-14 LAB — BASIC METABOLIC PANEL
Anion gap: 7 (ref 5–15)
BUN: 15 mg/dL (ref 6–20)
CHLORIDE: 108 mmol/L (ref 101–111)
CO2: 24 mmol/L (ref 22–32)
CREATININE: 0.61 mg/dL (ref 0.44–1.00)
Calcium: 9.1 mg/dL (ref 8.9–10.3)
Glucose, Bld: 104 mg/dL — ABNORMAL HIGH (ref 65–99)
Potassium: 3.9 mmol/L (ref 3.5–5.1)
SODIUM: 139 mmol/L (ref 135–145)

## 2015-08-14 LAB — CBC
HCT: 35.5 % — ABNORMAL LOW (ref 36.0–46.0)
HEMOGLOBIN: 12.1 g/dL (ref 12.0–15.0)
MCH: 32 pg (ref 26.0–34.0)
MCHC: 34.1 g/dL (ref 30.0–36.0)
MCV: 93.9 fL (ref 78.0–100.0)
PLATELETS: 112 10*3/uL — AB (ref 150–400)
RBC: 3.78 MIL/uL — AB (ref 3.87–5.11)
RDW: 13.1 % (ref 11.5–15.5)
WBC: 13.5 10*3/uL — ABNORMAL HIGH (ref 4.0–10.5)

## 2015-08-14 MED ORDER — SALINE SPRAY 0.65 % NA SOLN
1.0000 | NASAL | Status: DC | PRN
  Administered 2015-08-14: 1 via NASAL
  Filled 2015-08-14: qty 44

## 2015-08-14 NOTE — Progress Notes (Addendum)
Per ortho/PT note Pt to go home with HHPT.    [name] [phone]

## 2015-08-14 NOTE — Progress Notes (Signed)
Patient ID: [name], female   DOB: 30-May-1941, 74 y.o.   MRN: [id number] Subjective: 1 Day Post-Op Procedure(s) (LRB): TOTAL KNEE REVISION (Right)    Patient reports pain as moderate.  Doing OK but not much sleep last night due to beeps from machines  Objective:   VITALS:   Filed Vitals:   08/14/15 0121 08/14/15 0533  BP: 125/55 115/56  Pulse: 69 65  Temp: 98.5 F (36.9 C) 97.9 F (36.6 C)  Resp: 16 16    Neurovascular intact Incision: dressing C/D/I  LABS  Recent Labs  08/14/15 0415  HGB 12.1  HCT 35.5*  WBC 13.5*  PLT 112*     Recent Labs  08/14/15 0415  NA 139  K 3.9  BUN 15  CREATININE 0.61  GLUCOSE 104*    No results for input(s): LABPT, INR in the last 72 hours.   Assessment/Plan: 1 Day Post-Op Procedure(s) (LRB): TOTAL KNEE REVISION (Right)   Advance diet Up with therapy Plan for discharge tomorrow to home with HHPT for 2 weeks

## 2015-08-14 NOTE — Progress Notes (Signed)
Physical Therapy Treatment Patient Details Name: [name] MRN: [id number] DOB: 12-31-1940 Today's Date: 08/14/2015    History of Present Illness 74 yo female s/p R TK revision 08/13/15.     PT Comments    Progressing with mobility.   Follow Up Recommendations  Home health PT;Supervision - Intermittent     Equipment Recommendations  Rolling walker with 5" wheels    Recommendations for Other Services       Precautions / Restrictions Precautions Precautions: Fall;Knee Restrictions Weight Bearing Restrictions: No RLE Weight Bearing: Weight bearing as tolerated    Mobility  Bed Mobility Overal bed mobility: Needs Assistance Bed Mobility: Supine to Sit;Sit to Supine     Supine to sit: Supervision Sit to supine: Supervision   General bed mobility comments: safety  Transfers Overall transfer level: Needs assistance Equipment used: Rolling walker (2 wheeled) Transfers: Sit to/from Stand Sit to Stand: Supervision         General transfer comment: Supervision for safety and VCs for safety, hand placement  Ambulation/Gait Ambulation/Gait assistance: Min guard Ambulation Distance (Feet): 125 Feet Assistive device: Rolling walker (2 wheeled) Gait Pattern/deviations: Step-to pattern     General Gait Details: close guard for safety.   Stairs            Wheelchair Mobility    Modified Rankin (Stroke Patients Only)       Balance Overall balance assessment: Needs assistance Sitting-balance support: No upper extremity supported;Feet supported Sitting balance-Leahy Scale: Good     Standing balance support: Bilateral upper extremity supported;During functional activity Standing balance-Leahy Scale: Fair                      Cognition Arousal/Alertness: Awake/alert Behavior During Therapy: WFL for tasks assessed/performed Overall Cognitive Status: Within Functional Limits for tasks assessed                      Exercises  Total Joint Exercises Knee Flexion: AROM;10 reps;Seated Goniometric ROM: ~10-100 degrees (seated)    General Comments        Pertinent Vitals/Pain Pain Assessment: 0-10 Pain Score: 5  Pain Location: R knee Pain Descriptors / Indicators: Aching;Sore Pain Intervention(s): Monitored during session;Premedicated before session;Repositioned    Home Living Family/patient expects to be discharged to:: Private residence Living Arrangements: Spouse/significant other Available Help at Discharge: Family Type of Home: House Home Access: Stairs to enter   Home Layout: Able to live on main level with bedroom/bathroom;Two level Home Equipment: Shower seat      Prior Function Level of Independence: Independent          PT Goals (current goals can now be found in the care plan section) Acute Rehab PT Goals Patient Stated Goal: less pain. regain PLOF PT Goal Formulation: With patient Time For Goal Achievement: 08/21/15 Potential to Achieve Goals: Good Progress towards PT goals: Progressing toward goals    Frequency  7X/week    PT Plan Current plan remains appropriate    Co-evaluation             End of Session Equipment Utilized During Treatment: Gait belt Activity Tolerance: Patient tolerated treatment well Patient left: in bed;with call bell/phone within reach;with family/visitor present (with OT taking over session)     Time: [id number] PT Time Calculation (min) (ACUTE ONLY): 11 min  Charges:  $Gait Training: 8-22 mins                    G  Codes:      Weston Anna, MPT Pager: [phone]

## 2015-08-14 NOTE — Evaluation (Signed)
Occupational Therapy Evaluation Patient Details Name: [name] MRN: [id number] DOB: 1941-08-01 Today's Date: 08/14/2015    History of Present Illness 74 yo female s/p R TK revision 08/13/15.    Clinical Impression   Patient admitted with above. Patient independent PTA. Patient currently functioning at a supervision to min assist level. This is patient's 3rd knee surgery per her report and she states that her husband can assist post acute d/c.   No additional OT needs identified, D/C from acute OT services and no additional follow-up OT needs at this time. All appropriate education provided to patient and family (husband). Please re-order OT if needed.      Follow Up Recommendations  No OT follow up;Supervision - Intermittent    Equipment Recommendations  None recommended by OT    Recommendations for Other Services  None at this time    Precautions / Restrictions Precautions Precautions: Fall;Knee Restrictions Weight Bearing Restrictions: Yes RLE Weight Bearing: Weight bearing as tolerated    Mobility Bed Mobility Overal bed mobility: Needs Assistance Bed Mobility: Supine to Sit     Supine to sit: HOB elevated;Supervision     General bed mobility comments: increased time, no physical assistance needed  Transfers Overall transfer level: Needs assistance Equipment used: Rolling walker (2 wheeled) Transfers: Sit to/from Stand Sit to Stand: Supervision General transfer comment: Supervision for safent and VCs for safety, hand placement    Balance Overall balance assessment: Needs assistance Sitting-balance support: No upper extremity supported;Feet supported Sitting balance-Leahy Scale: Good     Standing balance support: Bilateral upper extremity supported;During functional activity Standing balance-Leahy Scale: Fair    ADL Overall ADL's : Needs assistance/impaired General ADL Comments: Pt overall supervision to min assist with ADLs and functional  mobility. This is patient's 3rd knee surgery and states that her husband can assist prn for safety at home. No DME needed at this time.     Pertinent Vitals/Pain Pain Assessment: 0-10 Pain Score: 4  Pain Location: right knee Pain Descriptors / Indicators: Aching;Sore Pain Intervention(s): Monitored during session;Repositioned     Hand Dominance Right   Extremity/Trunk Assessment Upper Extremity Assessment Upper Extremity Assessment: Overall WFL for tasks assessed   Lower Extremity Assessment Lower Extremity Assessment: Defer to PT evaluation RLE Deficits / Details: hip flex 3-/5, hip abd/add 3-/5,moves ankle well   Cervical / Trunk Assessment Cervical / Trunk Assessment: Normal   Communication Communication Communication: No difficulties   Cognition Arousal/Alertness: Awake/alert Behavior During Therapy: WFL for tasks assessed/performed Overall Cognitive Status: Within Functional Limits for tasks assessed              Home Living Family/patient expects to be discharged to:: Private residence Living Arrangements: Spouse/significant other Available Help at Discharge: Family Type of Home: House Home Access: Stairs to enter     Home Layout: Able to live on main level with bedroom/bathroom;Two level     Bathroom Shower/Tub: Teacher, early years/pre: Handicapped height     Home Equipment: Shower seat          Prior Functioning/Environment Level of Independence: Independent      OT Diagnosis: Generalized weakness;Acute pain   OT Problem List:  n/a, no acute OT needs identified    OT Treatment/Interventions:   n/a, no acute OT needs identified    OT Goals(Current goals can be found in the care plan section) Acute Rehab OT Goals Patient Stated Goal: less pain. regain PLOF OT Goal Formulation: All assessment and education complete, DC therapy  OT Frequency:  n/a, no acute OT needs identified    Barriers to D/C:  None known at this time    End of  Session Equipment Utilized During Treatment: Rolling walker  Activity Tolerance: Patient tolerated treatment well Patient left: in bed;with call bell/phone within reach;with chair alarm set;with family/visitor present   Time: [id number] OT Time Calculation (min): 15 min Charges:  OT General Charges $OT Visit: 1 Procedure OT Evaluation $Initial OT Evaluation Tier I: 1 Procedure  Jashon Ishida , MS, OTR/L, CLT Pager: [phone]  08/14/2015, 3:38 PM

## 2015-08-14 NOTE — Op Note (Signed)
[name], [name] NO.:  [id number]  MEDICAL RECORD NO.:  [id number]  LOCATION:  [id number]                         FACILITY:  [facility]  PHYSICIAN:  Pietro Cassis. Alvan Dame, M.D.  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  PROCEDURE:  Revision right total knee arthroplasty.  COMPONENTS USED:  DePuy revision knee system with a size 2.5 posterior stabilized femur, +2 adapter, 5-degree bolt with a 13 x 30 cemented stem.  A size 2.5 MBT revision tray with a 29 cemented sleeve, 13 x 30 stem, and a 10-mm medial augment.  A 12.5 posterior stabilized insert to match 2.5 femur and 38 patellar button.  SURGEON:  Pietro Cassis. Alvan Dame, M.D.  ASSISTANT:  Danae Orleans, PA-C.  Note that Mr. [name] was present for the entirety of the case from preoperative position, perioperative management of the operative extremity, general facilitation of the case and primary wound closure.  ANESTHESIA:  Preoperative regional adductor canal block plus general anesthetic.  SPECIMENS:  None.  COMPLICATION:  None.  TOURNIQUET TIME:  55 minutes at 250 mmHg.  BLOOD LOSS:  Minimal.  INDICATIONS FOR PROCEDURE:  [name] is a 74 year old female who was a patient of one of our retired partners.  She had presented to the office recently with increasing right knee pain.  Radiographic workup was concerning for loosening, we ruled out infection and subsequently confirmed loosening by bone scan.  She wished to proceed with surgical intervention, disadvantages and advantages, discomfort and reduced quality of life.  Risks of infection, DVT, component failure, need for future revision surgery were discussed in this 74 year old female. Consent was obtained for benefit of  pain relief and improved function.  PROCEDURE IN DETAIL:  The patient was brought to the operative theater. Once adequate anesthesia, preoperative antibiotics, Ancef, 1 g of tranexamic acid and 10 mg of Decadron, she was positioned supine with a right thigh tourniquet placed.  The right lower extremity was then prepped and draped in sterile fashion.  Time-out was performed identifying the patient, planned procedure and extremity.  Leg was exsanguinated.  Tourniquet was elevated to 250 mmHg.  Her old incision was identified to be more of a medial-based curvilinear incision.  I tried to straighten it out as much as possible using the greater portion of the incision, but some of it was left unused.  Soft- tissue plane was created followed by median arthrotomy encountering clear synovial fluid as expected from our preoperative workup. Following initial exposure including synovectomy of the medial, lateral and suprapatellar aspect of the joint, the knee was exposed and the knee flexed.  At this point, we removed the polyethylene insert from the tibial tray.  We then used a thin ACL saw to remove and undermined the cement surface between the bone on the tibia and the femoral side.  The femoral component was removed first with minimal-to-no blood loss.  The tibial component was removed without bone loss.  Previous component had subsided and was placed  into a bit of varus.  At this point, after removing all the cement from the proximal tibia, I opened up the proximal tibia and distal femur using hand reamers and reamed up to 15 mm to allow for 13-mm cemented stem.  Attention was now directed tibia first.  Again noticed the varus proximal tibia cut, extramedullary guide was placed with minimal bone and cement taken off the lateral side of the tibia, there was no bone at all really on the medial side.  This led to the recognition that I was going to use at least 5-10 mm augment.  Once the  proximal tibia cut was made and excessive cement was removed, we sized the tibia size 2.5.  With the 2.5 half tray placed, I recognized that 10-mm augment was going to keep me at neutral as opposed to tipping into valgus varus and would not excessively tilted over to the valgus.  Once the medial aspect of the proximal tibia was repaired, we drilled for the MBT tray and then broached for a 29 cemented broach.  At this point, a trial component was placed with a 29 cemented sleeve, a 10-mm medial augment.  This tibial tray was impacted and confirmed to be perpendicular to the tibia and parallel to the tibial spine.  Once this was in place, we used it to set rotation on the distal femur.  At this point, attention was directed to the femur.  The intramedullary rod was placed in the femur and the distal femoral cutting block and jig was placed onto the end of the femur, minimal bone and resected medial and lateral symmetrically.  I then elected to use a size 2.5 femur.  The size 2.5 rotation block was then pinned into position.  No bone was resected anteriorly, even though, there was a +2 bolt, minimal bone at the posteromedial aspect of the femur indicating perhaps some internal rotation of the previous component.  Chamfer cuts for air balls as well.  At this point, I prepared the box for posterior stabilized insert giving the fact that the medial and collateral ligaments were felt to be intact.  The trial reduction was now carried out with a 2.5 femur with no augmentation.  With this in place, we first trialed with a 10 and then a 12.5 insert.  At which point, we felt we had symmetric balancing from extension to flexion with full extension and good flexion and stability.  With these trial components in place, I now focused on the patella.  The patellar button was removed using the oscillating saw and then the patella prepared for a 38 patellar button.  The patella tracked  through the trochlea without application of pressure.  At this point, all trial components were removed.  We injected the synovial capsule junction of the knee with 0.25% Marcaine with epinephrine and 1 mL of Toradol and 30 mL of saline for total of 61 mL. Final components were opened and configured on the back table under my direct supervision.  Cement was then mixed.  The knee was irrigated with normal saline solution.  Cut surfaces were dried.  Final components were then cemented into place and the knee was brought to extension with a 12.5 insert until the cement cured.  Excessive cement was removed throughout the knee.  Once the cement fully cured, an excessive cement was removed throughout the knee.  The knee was flexed, the insert was removed and we did not visualize any other cement.  The final  12.5 insert to match the 2.5 femur was inserted.  The knee was then irrigated again.  The knee was brought to 60 degrees of flexion with extensor mechanism closed in flexion using #1 Vicryl and 0 V-Loc sutures.  The remainder of the wound was closed with 2-0 Vicryl and running 3-0 Monocryl.  The knee was cleaned, dried and dressed sterilely using Dermabond and Aquacel dressing.  She was then brought to the recovery room in stable condition, tolerating the procedure well.  Findings were reviewed with family.     Pietro Cassis Alvan Dame, M.D.     MDO/MEDQ  D:  08/13/2015  T:  08/14/2015  Job:  [id number]

## 2015-08-14 NOTE — Evaluation (Signed)
Physical Therapy Evaluation Patient Details Name: [name] MRN: [id number] DOB: 29-Jun-1941 Today's Date: 08/14/2015   History of Present Illness  74 yo female s/p R TK revision 08/13/15.   Clinical Impression  On eval, pt required Min assist for mobility-walked ~75 feet with RW. Pain rated 7/10.     Follow Up Recommendations Home health PT;Supervision - Intermittent    Equipment Recommendations  Rolling walker with 5" wheels    Recommendations for Other Services       Precautions / Restrictions Precautions Precautions: Fall;Knee Restrictions Weight Bearing Restrictions: No RLE Weight Bearing: Weight bearing as tolerated      Mobility  Bed Mobility Overal bed mobility: Needs Assistance Bed Mobility: Supine to Sit     Supine to sit: Min guard;HOB elevated     General bed mobility comments: close guard for safety. Increased time.  Transfers Overall transfer level: Needs assistance Equipment used: Rolling walker (2 wheeled) Transfers: Sit to/from Stand Sit to Stand: Min guard         General transfer comment: close guard for safety. VCs safety, hand placement  Ambulation/Gait Ambulation/Gait assistance: Min guard Ambulation Distance (Feet): 75 Feet Assistive device: Rolling walker (2 wheeled) Gait Pattern/deviations: Step-to pattern;Antalgic     General Gait Details: close guard for safety. VCs safety, sequence.   Stairs            Wheelchair Mobility    Modified Rankin (Stroke Patients Only)       Balance                                             Pertinent Vitals/Pain Pain Assessment: 0-10 Pain Score: 7  Pain Location: R knee Pain Descriptors / Indicators: Aching;Sore Pain Intervention(s): Monitored during session;Ice applied;Repositioned    Home Living                        Prior Function                 Hand Dominance        Extremity/Trunk Assessment   Upper Extremity  Assessment: Defer to OT evaluation           Lower Extremity Assessment: RLE deficits/detail RLE Deficits / Details: hip flex 3-/5, hip abd/add 3-/5,moves ankle well    Cervical / Trunk Assessment: Normal  Communication      Cognition Arousal/Alertness: Awake/alert Behavior During Therapy: WFL for tasks assessed/performed Overall Cognitive Status: Within Functional Limits for tasks assessed                      General Comments      Exercises Total Joint Exercises Ankle Circles/Pumps: AROM;10 reps;Both;Supine Quad Sets: AROM;Both;10 reps;Supine Hip ABduction/ADduction: AROM;AAROM;Right;10 reps;Supine Straight Leg Raises: AROM;AAROM;Right;10 reps;Supine Goniometric ROM: ~10-55 degrees      Assessment/Plan    PT Assessment Patient needs continued PT services  PT Diagnosis Difficulty walking;Acute pain   PT Problem List Decreased strength;Decreased range of motion;Decreased activity tolerance;Decreased balance;Decreased mobility;Decreased knowledge of use of DME;Pain  PT Treatment Interventions DME instruction;Gait training;Stair training;Functional mobility training;Therapeutic activities;Patient/family education;Balance training;Therapeutic exercise   PT Goals (Current goals can be found in the Care Plan section) Acute Rehab PT Goals Patient Stated Goal: less pain. regain PLOF PT Goal Formulation: With patient Time For Goal Achievement: 08/21/15 Potential to Achieve Goals: Good  Frequency 7X/week   Barriers to discharge        Co-evaluation               End of Session Equipment Utilized During Treatment: Gait belt Activity Tolerance: Patient tolerated treatment well Patient left: in chair;with call bell/phone within reach;with chair alarm set           Time: [id number] PT Time Calculation (min) (ACUTE ONLY): 22 min   Charges:   PT Evaluation $Initial PT Evaluation Tier I: 1 Procedure     PT G Codes:        Weston Anna,  MPT Pager: [phone]

## 2015-08-15 LAB — BASIC METABOLIC PANEL
ANION GAP: 6 (ref 5–15)
BUN: 14 mg/dL (ref 6–20)
CHLORIDE: 107 mmol/L (ref 101–111)
CO2: 27 mmol/L (ref 22–32)
Calcium: 9.7 mg/dL (ref 8.9–10.3)
Creatinine, Ser: 0.62 mg/dL (ref 0.44–1.00)
Glucose, Bld: 114 mg/dL — ABNORMAL HIGH (ref 65–99)
POTASSIUM: 3.8 mmol/L (ref 3.5–5.1)
SODIUM: 140 mmol/L (ref 135–145)

## 2015-08-15 LAB — CBC
HCT: 33.8 % — ABNORMAL LOW (ref 36.0–46.0)
HEMOGLOBIN: 11.7 g/dL — AB (ref 12.0–15.0)
MCH: 32.6 pg (ref 26.0–34.0)
MCHC: 34.6 g/dL (ref 30.0–36.0)
MCV: 94.2 fL (ref 78.0–100.0)
PLATELETS: 97 10*3/uL — AB (ref 150–400)
RBC: 3.59 MIL/uL — AB (ref 3.87–5.11)
RDW: 13 % (ref 11.5–15.5)
WBC: 10.8 10*3/uL — AB (ref 4.0–10.5)

## 2015-08-15 MED ORDER — ASPIRIN 325 MG PO TBEC: 325.0000 mg | DELAYED_RELEASE_TABLET | Freq: Two times a day (BID) | ORAL | Status: DC

## 2015-08-15 MED ORDER — POLYETHYLENE GLYCOL 3350 17 G PO PACK: 17.0000 g | PACK | Freq: Two times a day (BID) | ORAL | Status: DC

## 2015-08-15 MED ORDER — OXYCODONE HCL 5 MG PO TABS: 5.0000 mg | ORAL_TABLET | ORAL | Status: DC | PRN

## 2015-08-15 MED ORDER — FERROUS SULFATE 325 (65 FE) MG PO TABS: 325.0000 mg | ORAL_TABLET | Freq: Three times a day (TID) | ORAL | Status: DC

## 2015-08-15 MED ORDER — METHOCARBAMOL 500 MG PO TABS: 500.0000 mg | ORAL_TABLET | Freq: Four times a day (QID) | ORAL | Status: DC | PRN

## 2015-08-15 MED ORDER — DOCUSATE SODIUM 100 MG PO CAPS: 100.0000 mg | ORAL_CAPSULE | Freq: Two times a day (BID) | ORAL | Status: DC

## 2015-08-15 NOTE — Progress Notes (Signed)
Physical Therapy Treatment Patient Details Name: [name] MRN: [id number] DOB: Sep 22, 1941 Today's Date: 08/15/2015    History of Present Illness 74 yo female s/p R TK revision 08/13/15.     PT Comments    POD # 2 Pt progressing well and plans to D/C to home today.  Given HEP and practiced one step entry.  Follow Up Recommendations  Home health PT;Supervision - Intermittent     Equipment Recommendations  Rolling walker with 5" wheels (youth)    Recommendations for Other Services       Precautions / Restrictions Precautions Precautions: Fall;Knee Restrictions Weight Bearing Restrictions: No RLE Weight Bearing: Weight bearing as tolerated    Mobility  Bed Mobility Overal bed mobility: Needs Assistance Bed Mobility: Supine to Sit;Sit to Supine     Supine to sit: Supervision Sit to supine: Supervision   General bed mobility comments: increased time  Transfers Overall transfer level: Needs assistance Equipment used: Rolling walker (2 wheeled) Transfers: Sit to/from Stand Sit to Stand: Supervision         General transfer comment: Supervision for safety and VCs for safety, hand placement  Ambulation/Gait Ambulation/Gait assistance: Supervision Ambulation Distance (Feet): 85 Feet Assistive device: Rolling walker (2 wheeled) Gait Pattern/deviations: Step-to pattern     General Gait Details: close guard for safety.   Stairs Stairs: Yes Stairs assistance: Min guard Stair Management: No rails;Step to pattern;Forwards;With walker Number of Stairs: 1 General stair comments: 50% VC's on proper sequencing and safety with walker placement.   Wheelchair Mobility    Modified Rankin (Stroke Patients Only)       Balance                                    Cognition Arousal/Alertness: Awake/alert Behavior During Therapy: WFL for tasks assessed/performed Overall Cognitive Status: Within Functional Limits for tasks assessed                       Exercises   Total Knee Replacement TE's 10 reps B LE ankle pumps 10 reps towel squeezes 10 reps knee presses 10 reps heel slides  10 reps SAQ's 10 reps SLR's 10 reps ABD Followed by ICE     General Comments        Pertinent Vitals/Pain Pain Assessment: 0-10 Pain Score: 2  Pain Location: R knee Pain Descriptors / Indicators: Sore;Tender Pain Intervention(s): Monitored during session;Premedicated before session;Repositioned;Ice applied    Home Living                      Prior Function            PT Goals (current goals can now be found in the care plan section) Progress towards PT goals: Progressing toward goals    Frequency  7X/week    PT Plan Current plan remains appropriate    Co-evaluation             End of Session Equipment Utilized During Treatment: Gait belt Activity Tolerance: Patient tolerated treatment well Patient left: in bed;with call bell/phone within reach;with family/visitor present     Time: 1035-1100 PT Time Calculation (min) (ACUTE ONLY): 25 min  Charges:  $Gait Training: 8-22 mins $Therapeutic Exercise: 8-22 mins                    G Codes:      Rica Koyanagi  PTA [facility]  Acute  Rehab Pager      [phone]

## 2015-08-15 NOTE — Progress Notes (Signed)
     Subjective: 2 Days Post-Op Procedure(s) (LRB): TOTAL KNEE REVISION (Right)   Patient reports pain as mild, pain controlled. No events throughout the night.  Ready to be discharged home.  Objective:   VITALS:   Filed Vitals:   08/15/15 0622 08/15/15 0727  BP: 148/55 155/86  Pulse: 69 62  Temp: 98.6 F (37 C)   Resp: 16     Dorsiflexion/Plantar flexion intact Incision: dressing C/D/I No cellulitis present Compartment soft  LABS  Recent Labs  08/14/15 0415 08/15/15 0400  HGB 12.1 11.7*  HCT 35.5* 33.8*  WBC 13.5* 10.8*  PLT 112* 97*     Recent Labs  08/14/15 0415 08/15/15 0400  NA 139 140  K 3.9 3.8  BUN 15 14  CREATININE 0.61 0.62  GLUCOSE 104* 114*     Assessment/Plan: 2 Days Post-Op Procedure(s) (LRB): TOTAL KNEE REVISION (Right) Up with therapy Discharge home with home health  Follow up in 2 weeks at [facility] Follow up with OLIN,Delson Dulworth D in 2 weeks.  Contact information:  [facility] [address] [place] [id number]    Obese (BMI 30-39.9) Estimated body mass index is 31.54 kg/(m^2) as calculated from the following:   Height as of this encounter: 5\' 3"  (1.6 m).   Weight as of this encounter: 80.74 kg (178 lb). Patient also counseled that weight may inhibit the healing process Patient counseled that losing weight will help with future health issues        West Pugh. Latoiya Maradiaga   PAC  08/15/2015, 9:11 AM

## 2015-08-15 NOTE — Care Management Note (Signed)
Case Management Note  Patient Details  Name: MIKINZIE GRINNELL MRN: [id number] Date of Birth: 11/30/1940  Subjective/Objective:   74 yo admitted with S/P revision of right TKA                 Action/Plan: From home with husband  Expected Discharge Date:                  Expected Discharge Plan:  [place]  In-House Referral:     Discharge planning Services  CM Consult  Post Acute Care Choice:  Home Health Choice offered to:  Patient  DME Arranged:  Walker rolling DME Agency:  [place] Arranged:  PT [place]:  [place]  Status of Service:  Completed, signed off  Medicare Important Message Given:    Date Medicare IM Given:    Medicare IM give by:    Date Additional Medicare IM Given:    Additional Medicare Important Message give by:     If discussed at [place] of Stay Meetings, dates discussed:    Additional Comments: Pt offered choice for [facility] services and chooses [place]. [place] [employer] rep contacted for referral. Pt requesting rolling walker and states she has a 3 in 1 already at home. [place] DME rep contacted for rolling walker. No other CM needs communicated. Lynnell Catalan, RN 08/15/2015, 10:09 AM

## 2015-08-15 NOTE — Discharge Instructions (Signed)

## 2015-08-17 DIAGNOSIS — Z96653 Presence of artificial knee joint, bilateral: Secondary | ICD-10-CM | POA: Diagnosis not present

## 2015-08-17 DIAGNOSIS — Z4733 Aftercare following explantation of knee joint prosthesis: Secondary | ICD-10-CM | POA: Diagnosis not present

## 2015-08-17 DIAGNOSIS — Z6831 Body mass index (BMI) 31.0-31.9, adult: Secondary | ICD-10-CM | POA: Diagnosis not present

## 2015-08-17 DIAGNOSIS — F17200 Nicotine dependence, unspecified, uncomplicated: Secondary | ICD-10-CM | POA: Diagnosis not present

## 2015-08-20 DIAGNOSIS — Z96653 Presence of artificial knee joint, bilateral: Secondary | ICD-10-CM | POA: Diagnosis not present

## 2015-08-20 DIAGNOSIS — Z6831 Body mass index (BMI) 31.0-31.9, adult: Secondary | ICD-10-CM | POA: Diagnosis not present

## 2015-08-20 DIAGNOSIS — F17200 Nicotine dependence, unspecified, uncomplicated: Secondary | ICD-10-CM | POA: Diagnosis not present

## 2015-08-20 DIAGNOSIS — Z4733 Aftercare following explantation of knee joint prosthesis: Secondary | ICD-10-CM | POA: Diagnosis not present

## 2015-08-20 NOTE — Discharge Summary (Signed)
Physician Discharge Summary  Patient ID: NIYAH FLAMMER MRN: [id number] DOB/AGE: Dec 13, 1940 74 y.o.  Admit date: 08/13/2015 Discharge date: 08/15/2015   Procedures:  Procedure(s) (LRB): TOTAL KNEE REVISION (Right)  Attending Physician:  Dr. Paralee Cancel   Admission Diagnoses:   Right knee pain s/p TKA  Discharge Diagnoses:  Principal Problem:   S/P revision of right TKA Active Problems:   S/P revision of total knee  Past Medical History  Diagnosis Date  . Hypertension   . Thyroid disease   . Anemia   . Allergy   . Sciatica   . Cancer ([place])     skin cancer  . Sleep apnea   . Hypothyroidism     HPI:    [name], 74 y.o. 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 The indications for the revision of the total knee arthroplasty are mechanical failure of one or components. Onset of symptoms was gradual starting years ago with gradually worsening course since that time. Prior procedures on the right knee(s) include arthroplasty. Patient currently rates pain in the right knee(s) at 9 out of 10 with activity. There is night pain, worsening of pain with activity and weight bearing, pain that interferes with activities of daily living and pain with passive range of motion. Patient has evidence of previous TKA by imaging studies. This condition presents safety issues increasing the risk of falls. There is no current active infection. Risks, benefits and expectations were discussed with the patient. Risks including but not limited to the risk of anesthesia, blood clots, nerve damage, blood vessel damage, failure of the prosthesis, infection and up to and including death. Patient understand the risks, benefits and expectations and wishes to proceed with  surgery.  PCP: Lavera Guise, MD   Discharged Condition: good  Hospital Course:  Patient underwent the above stated procedure on 08/13/2015. Patient tolerated the procedure well and brought to the recovery room in good condition and subsequently to the floor.  POD #1 BP: 115/56 ; Pulse: 65 ; Temp: 97.9 F (36.6 C) ; Resp: 16 Patient reports pain as moderate. Doing OK but not much sleep last night due to beeps from machines. Neurovascular intact and incision: dressing C/D/I  LABS  Basename    HGB     12.7  HCT     35.5   POD #2  BP: 155/86 ; Pulse: 62 ; Temp: 98.6 F (37 C) ; Resp: 16 Patient reports pain as mild, pain controlled. No events throughout the night. Ready to be discharged home. Dorsiflexion/plantar flexion intact, incision: dressing C/D/I, no cellulitis present and compartment soft.   LABS  Basename    HGB     11.7  HCT     33.8    Discharge Exam: General appearance: alert, cooperative and no distress Extremities: Homans sign is negative, no sign of DVT, no edema, redness or tenderness in the calves or thighs and no ulcers, gangrene or trophic changes  Disposition: Home with follow up in 2 weeks   Follow-up Information    Follow up with Mauri Pole, MD. Schedule an appointment as soon as possible for a visit in 2 weeks.   Specialty:  Orthopedic Surgery   Contact information:   [address] [place] [address] [phone]  Follow up with [facility].   Contact information:   [place] [address] [phone]       Discharge Instructions    Call MD / Call 911    Complete by:  As directed   If you experience chest pain or shortness of breath, CALL 911 and be transported to the hospital emergency room.  If you develope a fever above 101 F, pus (white drainage) or increased drainage or redness at the wound, or calf pain, call your surgeon's office.     Change dressing    Complete by:  As  directed   Maintain surgical dressing until follow up in the clinic. If the edges start to pull up, may reinforce with tape. If the dressing is no longer working, may remove and cover with gauze and tape, but must keep the area dry and clean.  Call with any questions or concerns.     Constipation Prevention    Complete by:  As directed   Drink plenty of fluids.  Prune juice may be helpful.  You may use a stool softener, such as Colace (over the counter) 100 mg twice a day.  Use MiraLax (over the counter) for constipation as needed.     Diet - low sodium heart healthy    Complete by:  As directed      Discharge instructions    Complete by:  As directed   Maintain surgical dressing until follow up in the clinic. If the edges start to pull up, may reinforce with tape. If the dressing is no longer working, may remove and cover with gauze and tape, but must keep the area dry and clean.  Follow up in 2 weeks at [facility]. Call with any questions or concerns.     Increase activity slowly as tolerated    Complete by:  As directed   Weight bearing as tolerated with assist device (walker, cane, etc) as directed, use it as long as suggested by your surgeon or therapist, typically at least 4-6 weeks.     TED hose    Complete by:  As directed   Use stockings (TED hose) for 2 weeks on both leg(s).  You may remove them at night for sleeping.             Medication List    STOP taking these medications        oxyCODONE-acetaminophen 10-325 MG tablet  Commonly known as:  PERCOCET      TAKE these medications        ALPRAZolam 0.5 MG tablet  Commonly known as:  XANAX  Take 0.5 mg by mouth at bedtime as needed for anxiety.     aspirin 325 MG EC tablet  Take 1 tablet (325 mg total) by mouth 2 (two) times daily.     carvedilol 6.25 MG tablet  Commonly known as:  COREG  Take 6.25 mg by mouth 2 (two) times daily with a meal.     cetirizine 10 MG tablet  Commonly known as:  ZYRTEC    Take 10 mg by mouth daily.     citalopram 20 MG tablet  Commonly known as:  CELEXA  Take 20 mg by mouth daily.     docusate sodium 100 MG capsule  Commonly known as:  COLACE  Take 1 capsule (100 mg total) by mouth 2 (two) times daily.     ferrous sulfate 325 (65 FE) MG tablet  Take 1 tablet (  325 mg total) by mouth 3 (three) times daily after meals.     furosemide 20 MG tablet  Commonly known as:  LASIX  Take 20 mg by mouth daily.     gemfibrozil 600 MG tablet  Commonly known as:  LOPID  Take 600 mg by mouth 2 (two) times daily before a meal.     LEVOTHROID 125 MCG tablet  Generic drug:  levothyroxine  Take 125 mcg by mouth daily before breakfast.     lisinopril 10 MG tablet  Commonly known as:  PRINIVIL,ZESTRIL  Take 10 mg by mouth daily.     methocarbamol 500 MG tablet  Commonly known as:  ROBAXIN  Take 1 tablet (500 mg total) by mouth every 6 (six) hours as needed for muscle spasms.     OxyCODONE HCl (Abuse Deter) 5 MG Taba  Commonly known as:  OXAYDO  Take 5 mg by mouth 2 (two) times daily.     oxyCODONE 5 MG immediate release tablet  Commonly known as:  Oxy IR/ROXICODONE  Take 1-3 tablets (5-15 mg total) by mouth every 4 (four) hours as needed for severe pain.     polyethylene glycol packet  Commonly known as:  MIRALAX / GLYCOLAX  Take 17 g by mouth 2 (two) times daily.     simvastatin 40 MG tablet  Commonly known as:  ZOCOR  Take 40 mg by mouth daily.     traZODone 50 MG tablet  Commonly known as:  DESYREL  Take 50 mg by mouth at bedtime.         Signed: [place] Pugh. Avis Tirone   PA-C  08/20/2015, 9:42 AM

## 2015-08-22 DIAGNOSIS — Z6831 Body mass index (BMI) 31.0-31.9, adult: Secondary | ICD-10-CM | POA: Diagnosis not present

## 2015-08-22 DIAGNOSIS — F17200 Nicotine dependence, unspecified, uncomplicated: Secondary | ICD-10-CM | POA: Diagnosis not present

## 2015-08-22 DIAGNOSIS — Z4733 Aftercare following explantation of knee joint prosthesis: Secondary | ICD-10-CM | POA: Diagnosis not present

## 2015-08-22 DIAGNOSIS — Z96653 Presence of artificial knee joint, bilateral: Secondary | ICD-10-CM | POA: Diagnosis not present

## 2015-08-24 DIAGNOSIS — Z96653 Presence of artificial knee joint, bilateral: Secondary | ICD-10-CM | POA: Diagnosis not present

## 2015-08-24 DIAGNOSIS — Z6831 Body mass index (BMI) 31.0-31.9, adult: Secondary | ICD-10-CM | POA: Diagnosis not present

## 2015-08-24 DIAGNOSIS — F17200 Nicotine dependence, unspecified, uncomplicated: Secondary | ICD-10-CM | POA: Diagnosis not present

## 2015-08-24 DIAGNOSIS — Z4733 Aftercare following explantation of knee joint prosthesis: Secondary | ICD-10-CM | POA: Diagnosis not present

## 2015-08-27 DIAGNOSIS — Z96651 Presence of right artificial knee joint: Secondary | ICD-10-CM | POA: Diagnosis not present

## 2015-08-27 DIAGNOSIS — Z471 Aftercare following joint replacement surgery: Secondary | ICD-10-CM | POA: Diagnosis not present

## 2015-08-28 DIAGNOSIS — Z4733 Aftercare following explantation of knee joint prosthesis: Secondary | ICD-10-CM | POA: Diagnosis not present

## 2015-08-28 DIAGNOSIS — Z6831 Body mass index (BMI) 31.0-31.9, adult: Secondary | ICD-10-CM | POA: Diagnosis not present

## 2015-08-28 DIAGNOSIS — F17200 Nicotine dependence, unspecified, uncomplicated: Secondary | ICD-10-CM | POA: Diagnosis not present

## 2015-08-28 DIAGNOSIS — Z96653 Presence of artificial knee joint, bilateral: Secondary | ICD-10-CM | POA: Diagnosis not present

## 2015-08-29 DIAGNOSIS — Z96651 Presence of right artificial knee joint: Secondary | ICD-10-CM | POA: Diagnosis not present

## 2015-08-29 DIAGNOSIS — M25561 Pain in right knee: Secondary | ICD-10-CM | POA: Diagnosis not present

## 2015-08-31 DIAGNOSIS — M25561 Pain in right knee: Secondary | ICD-10-CM | POA: Diagnosis not present

## 2015-08-31 DIAGNOSIS — Z96651 Presence of right artificial knee joint: Secondary | ICD-10-CM | POA: Diagnosis not present

## 2015-09-04 DIAGNOSIS — Z96651 Presence of right artificial knee joint: Secondary | ICD-10-CM | POA: Diagnosis not present

## 2015-09-04 DIAGNOSIS — M25561 Pain in right knee: Secondary | ICD-10-CM | POA: Diagnosis not present

## 2015-09-07 DIAGNOSIS — M25561 Pain in right knee: Secondary | ICD-10-CM | POA: Diagnosis not present

## 2015-09-07 DIAGNOSIS — Z96651 Presence of right artificial knee joint: Secondary | ICD-10-CM | POA: Diagnosis not present

## 2015-09-10 DIAGNOSIS — M25561 Pain in right knee: Secondary | ICD-10-CM | POA: Diagnosis not present

## 2015-09-10 DIAGNOSIS — Z96651 Presence of right artificial knee joint: Secondary | ICD-10-CM | POA: Diagnosis not present

## 2015-09-13 DIAGNOSIS — Z96651 Presence of right artificial knee joint: Secondary | ICD-10-CM | POA: Diagnosis not present

## 2015-09-13 DIAGNOSIS — M25561 Pain in right knee: Secondary | ICD-10-CM | POA: Diagnosis not present

## 2015-09-18 DIAGNOSIS — I10 Essential (primary) hypertension: Secondary | ICD-10-CM | POA: Diagnosis not present

## 2015-09-18 DIAGNOSIS — E782 Mixed hyperlipidemia: Secondary | ICD-10-CM | POA: Diagnosis not present

## 2015-09-18 DIAGNOSIS — M25561 Pain in right knee: Secondary | ICD-10-CM | POA: Diagnosis not present

## 2015-09-18 DIAGNOSIS — Z0001 Encounter for general adult medical examination with abnormal findings: Secondary | ICD-10-CM | POA: Diagnosis not present

## 2015-09-18 DIAGNOSIS — E039 Hypothyroidism, unspecified: Secondary | ICD-10-CM | POA: Diagnosis not present

## 2015-09-18 DIAGNOSIS — Z96651 Presence of right artificial knee joint: Secondary | ICD-10-CM | POA: Diagnosis not present

## 2015-09-20 DIAGNOSIS — M25561 Pain in right knee: Secondary | ICD-10-CM | POA: Diagnosis not present

## 2015-09-20 DIAGNOSIS — Z96651 Presence of right artificial knee joint: Secondary | ICD-10-CM | POA: Diagnosis not present

## 2015-09-23 DIAGNOSIS — Z96651 Presence of right artificial knee joint: Secondary | ICD-10-CM | POA: Diagnosis not present

## 2015-09-23 DIAGNOSIS — M25561 Pain in right knee: Secondary | ICD-10-CM | POA: Diagnosis not present

## 2015-09-25 DIAGNOSIS — Z96651 Presence of right artificial knee joint: Secondary | ICD-10-CM | POA: Diagnosis not present

## 2015-09-25 DIAGNOSIS — M25561 Pain in right knee: Secondary | ICD-10-CM | POA: Diagnosis not present

## 2015-09-27 DIAGNOSIS — Z96651 Presence of right artificial knee joint: Secondary | ICD-10-CM | POA: Diagnosis not present

## 2015-09-27 DIAGNOSIS — Z471 Aftercare following joint replacement surgery: Secondary | ICD-10-CM | POA: Diagnosis not present

## 2015-10-05 DIAGNOSIS — M25561 Pain in right knee: Secondary | ICD-10-CM | POA: Diagnosis not present

## 2015-10-05 DIAGNOSIS — M47817 Spondylosis without myelopathy or radiculopathy, lumbosacral region: Secondary | ICD-10-CM | POA: Diagnosis not present

## 2015-10-05 DIAGNOSIS — M4807 Spinal stenosis, lumbosacral region: Secondary | ICD-10-CM | POA: Diagnosis not present

## 2015-10-05 DIAGNOSIS — M25562 Pain in left knee: Secondary | ICD-10-CM | POA: Diagnosis not present

## 2015-10-05 DIAGNOSIS — M5137 Other intervertebral disc degeneration, lumbosacral region: Secondary | ICD-10-CM | POA: Diagnosis not present

## 2015-10-05 DIAGNOSIS — F411 Generalized anxiety disorder: Secondary | ICD-10-CM | POA: Diagnosis not present

## 2015-10-05 DIAGNOSIS — M549 Dorsalgia, unspecified: Secondary | ICD-10-CM | POA: Diagnosis not present

## 2015-10-05 DIAGNOSIS — Z79899 Other long term (current) drug therapy: Secondary | ICD-10-CM | POA: Diagnosis not present

## 2015-10-05 DIAGNOSIS — M545 Low back pain: Secondary | ICD-10-CM | POA: Diagnosis not present

## 2015-10-05 DIAGNOSIS — Z79891 Long term (current) use of opiate analgesic: Secondary | ICD-10-CM | POA: Diagnosis not present

## 2015-10-11 DIAGNOSIS — E039 Hypothyroidism, unspecified: Secondary | ICD-10-CM | POA: Diagnosis not present

## 2015-10-11 DIAGNOSIS — E782 Mixed hyperlipidemia: Secondary | ICD-10-CM | POA: Diagnosis not present

## 2015-10-11 DIAGNOSIS — Z0001 Encounter for general adult medical examination with abnormal findings: Secondary | ICD-10-CM | POA: Diagnosis not present

## 2015-10-11 DIAGNOSIS — F330 Major depressive disorder, recurrent, mild: Secondary | ICD-10-CM | POA: Diagnosis not present

## 2015-10-11 DIAGNOSIS — E119 Type 2 diabetes mellitus without complications: Secondary | ICD-10-CM | POA: Diagnosis not present

## 2015-10-16 ENCOUNTER — Other Ambulatory Visit: Payer: Self-pay | Admitting: Physician Assistant

## 2015-10-16 ENCOUNTER — Other Ambulatory Visit: Payer: Self-pay | Admitting: Internal Medicine

## 2015-10-16 DIAGNOSIS — Z1231 Encounter for screening mammogram for malignant neoplasm of breast: Secondary | ICD-10-CM

## 2015-10-23 ENCOUNTER — Ambulatory Visit
Admission: RE | Admit: 2015-10-23 | Discharge: 2015-10-23 | Disposition: A | Discharge status: Home / Self Care | Payer: Medicare Other | Source: Ambulatory Visit | Attending: Physician Assistant | Admitting: Physician Assistant

## 2015-10-23 ENCOUNTER — Other Ambulatory Visit: Payer: Self-pay | Admitting: Physician Assistant

## 2015-10-23 DIAGNOSIS — Z1231 Encounter for screening mammogram for malignant neoplasm of breast: Secondary | ICD-10-CM | POA: Insufficient documentation

## 2015-11-08 DIAGNOSIS — Z471 Aftercare following joint replacement surgery: Secondary | ICD-10-CM | POA: Diagnosis not present

## 2015-11-08 DIAGNOSIS — Z96651 Presence of right artificial knee joint: Secondary | ICD-10-CM | POA: Diagnosis not present

## 2015-11-14 DIAGNOSIS — G4710 Hypersomnia, unspecified: Secondary | ICD-10-CM | POA: Diagnosis not present

## 2015-11-29 DIAGNOSIS — R0902 Hypoxemia: Secondary | ICD-10-CM | POA: Diagnosis not present

## 2015-11-29 DIAGNOSIS — F17210 Nicotine dependence, cigarettes, uncomplicated: Secondary | ICD-10-CM | POA: Diagnosis not present

## 2015-12-03 DIAGNOSIS — M5137 Other intervertebral disc degeneration, lumbosacral region: Secondary | ICD-10-CM | POA: Diagnosis not present

## 2015-12-03 DIAGNOSIS — M545 Low back pain: Secondary | ICD-10-CM | POA: Diagnosis not present

## 2015-12-03 DIAGNOSIS — M25562 Pain in left knee: Secondary | ICD-10-CM | POA: Diagnosis not present

## 2015-12-03 DIAGNOSIS — Z79899 Other long term (current) drug therapy: Secondary | ICD-10-CM | POA: Diagnosis not present

## 2015-12-03 DIAGNOSIS — M4807 Spinal stenosis, lumbosacral region: Secondary | ICD-10-CM | POA: Diagnosis not present

## 2015-12-03 DIAGNOSIS — M549 Dorsalgia, unspecified: Secondary | ICD-10-CM | POA: Diagnosis not present

## 2015-12-03 DIAGNOSIS — F411 Generalized anxiety disorder: Secondary | ICD-10-CM | POA: Diagnosis not present

## 2015-12-03 DIAGNOSIS — M25561 Pain in right knee: Secondary | ICD-10-CM | POA: Diagnosis not present

## 2015-12-03 DIAGNOSIS — M47817 Spondylosis without myelopathy or radiculopathy, lumbosacral region: Secondary | ICD-10-CM | POA: Diagnosis not present

## 2015-12-03 DIAGNOSIS — Z79891 Long term (current) use of opiate analgesic: Secondary | ICD-10-CM | POA: Diagnosis not present

## 2015-12-06 DIAGNOSIS — L821 Other seborrheic keratosis: Secondary | ICD-10-CM | POA: Diagnosis not present

## 2015-12-06 DIAGNOSIS — D485 Neoplasm of uncertain behavior of skin: Secondary | ICD-10-CM | POA: Diagnosis not present

## 2015-12-06 DIAGNOSIS — D0471 Carcinoma in situ of skin of right lower limb, including hip: Secondary | ICD-10-CM | POA: Diagnosis not present

## 2015-12-06 DIAGNOSIS — D0472 Carcinoma in situ of skin of left lower limb, including hip: Secondary | ICD-10-CM | POA: Diagnosis not present

## 2015-12-12 DIAGNOSIS — D0471 Carcinoma in situ of skin of right lower limb, including hip: Secondary | ICD-10-CM | POA: Diagnosis not present

## 2015-12-12 DIAGNOSIS — D0472 Carcinoma in situ of skin of left lower limb, including hip: Secondary | ICD-10-CM | POA: Diagnosis not present

## 2015-12-24 DIAGNOSIS — J449 Chronic obstructive pulmonary disease, unspecified: Secondary | ICD-10-CM | POA: Diagnosis not present

## 2016-01-28 DIAGNOSIS — M545 Low back pain: Secondary | ICD-10-CM | POA: Diagnosis not present

## 2016-01-28 DIAGNOSIS — M47817 Spondylosis without myelopathy or radiculopathy, lumbosacral region: Secondary | ICD-10-CM | POA: Diagnosis not present

## 2016-01-28 DIAGNOSIS — M25561 Pain in right knee: Secondary | ICD-10-CM | POA: Diagnosis not present

## 2016-01-28 DIAGNOSIS — Z79891 Long term (current) use of opiate analgesic: Secondary | ICD-10-CM | POA: Diagnosis not present

## 2016-01-28 DIAGNOSIS — M4807 Spinal stenosis, lumbosacral region: Secondary | ICD-10-CM | POA: Diagnosis not present

## 2016-01-28 DIAGNOSIS — F411 Generalized anxiety disorder: Secondary | ICD-10-CM | POA: Diagnosis not present

## 2016-01-28 DIAGNOSIS — M5137 Other intervertebral disc degeneration, lumbosacral region: Secondary | ICD-10-CM | POA: Diagnosis not present

## 2016-01-28 DIAGNOSIS — M25562 Pain in left knee: Secondary | ICD-10-CM | POA: Diagnosis not present

## 2016-01-28 DIAGNOSIS — M549 Dorsalgia, unspecified: Secondary | ICD-10-CM | POA: Diagnosis not present

## 2016-02-12 DIAGNOSIS — I10 Essential (primary) hypertension: Secondary | ICD-10-CM | POA: Diagnosis not present

## 2016-02-12 DIAGNOSIS — R0902 Hypoxemia: Secondary | ICD-10-CM | POA: Diagnosis not present

## 2016-02-12 DIAGNOSIS — I6523 Occlusion and stenosis of bilateral carotid arteries: Secondary | ICD-10-CM | POA: Diagnosis not present

## 2016-02-12 DIAGNOSIS — E782 Mixed hyperlipidemia: Secondary | ICD-10-CM | POA: Diagnosis not present

## 2016-02-12 DIAGNOSIS — F330 Major depressive disorder, recurrent, mild: Secondary | ICD-10-CM | POA: Diagnosis not present

## 2016-04-28 DIAGNOSIS — F411 Generalized anxiety disorder: Secondary | ICD-10-CM | POA: Diagnosis not present

## 2016-04-28 DIAGNOSIS — M25562 Pain in left knee: Secondary | ICD-10-CM | POA: Diagnosis not present

## 2016-04-28 DIAGNOSIS — Z79891 Long term (current) use of opiate analgesic: Secondary | ICD-10-CM | POA: Diagnosis not present

## 2016-04-28 DIAGNOSIS — M47817 Spondylosis without myelopathy or radiculopathy, lumbosacral region: Secondary | ICD-10-CM | POA: Diagnosis not present

## 2016-04-28 DIAGNOSIS — M25561 Pain in right knee: Secondary | ICD-10-CM | POA: Diagnosis not present

## 2016-04-28 DIAGNOSIS — M5137 Other intervertebral disc degeneration, lumbosacral region: Secondary | ICD-10-CM | POA: Diagnosis not present

## 2016-04-28 DIAGNOSIS — M4807 Spinal stenosis, lumbosacral region: Secondary | ICD-10-CM | POA: Diagnosis not present

## 2016-04-28 DIAGNOSIS — M549 Dorsalgia, unspecified: Secondary | ICD-10-CM | POA: Diagnosis not present

## 2016-04-28 DIAGNOSIS — M545 Low back pain: Secondary | ICD-10-CM | POA: Diagnosis not present

## 2016-04-28 DIAGNOSIS — Z79899 Other long term (current) drug therapy: Secondary | ICD-10-CM | POA: Diagnosis not present

## 2016-05-14 DIAGNOSIS — D225 Melanocytic nevi of trunk: Secondary | ICD-10-CM | POA: Diagnosis not present

## 2016-05-14 DIAGNOSIS — Z85828 Personal history of other malignant neoplasm of skin: Secondary | ICD-10-CM | POA: Diagnosis not present

## 2016-05-14 DIAGNOSIS — D2261 Melanocytic nevi of right upper limb, including shoulder: Secondary | ICD-10-CM | POA: Diagnosis not present

## 2016-05-14 DIAGNOSIS — D2272 Melanocytic nevi of left lower limb, including hip: Secondary | ICD-10-CM | POA: Diagnosis not present

## 2016-06-03 DIAGNOSIS — R7301 Impaired fasting glucose: Secondary | ICD-10-CM | POA: Diagnosis not present

## 2016-06-03 DIAGNOSIS — E782 Mixed hyperlipidemia: Secondary | ICD-10-CM | POA: Diagnosis not present

## 2016-06-03 DIAGNOSIS — I10 Essential (primary) hypertension: Secondary | ICD-10-CM | POA: Diagnosis not present

## 2016-06-03 DIAGNOSIS — E039 Hypothyroidism, unspecified: Secondary | ICD-10-CM | POA: Diagnosis not present

## 2016-06-05 ENCOUNTER — Ambulatory Visit
Admission: RE | Admit: 2016-06-05 | Discharge: 2016-06-05 | Disposition: A | Discharge status: Home / Self Care | Payer: Medicare Other | Source: Ambulatory Visit | Attending: Internal Medicine | Admitting: Internal Medicine

## 2016-06-05 ENCOUNTER — Other Ambulatory Visit: Payer: Self-pay | Admitting: Internal Medicine

## 2016-06-05 DIAGNOSIS — R0902 Hypoxemia: Secondary | ICD-10-CM | POA: Diagnosis not present

## 2016-06-05 DIAGNOSIS — I700 Atherosclerosis of aorta: Secondary | ICD-10-CM | POA: Diagnosis not present

## 2016-06-05 DIAGNOSIS — F17200 Nicotine dependence, unspecified, uncomplicated: Secondary | ICD-10-CM

## 2016-06-05 DIAGNOSIS — R0602 Shortness of breath: Secondary | ICD-10-CM | POA: Diagnosis not present

## 2016-06-05 DIAGNOSIS — G4730 Sleep apnea, unspecified: Secondary | ICD-10-CM | POA: Diagnosis not present

## 2016-06-05 DIAGNOSIS — J989 Respiratory disorder, unspecified: Secondary | ICD-10-CM | POA: Insufficient documentation

## 2016-06-05 DIAGNOSIS — J301 Allergic rhinitis due to pollen: Secondary | ICD-10-CM | POA: Diagnosis not present

## 2016-06-05 DIAGNOSIS — F17210 Nicotine dependence, cigarettes, uncomplicated: Secondary | ICD-10-CM | POA: Insufficient documentation

## 2016-06-16 DIAGNOSIS — E039 Hypothyroidism, unspecified: Secondary | ICD-10-CM | POA: Diagnosis not present

## 2016-06-16 DIAGNOSIS — R0602 Shortness of breath: Secondary | ICD-10-CM | POA: Diagnosis not present

## 2016-06-16 DIAGNOSIS — J301 Allergic rhinitis due to pollen: Secondary | ICD-10-CM | POA: Diagnosis not present

## 2016-06-16 DIAGNOSIS — Z23 Encounter for immunization: Secondary | ICD-10-CM | POA: Diagnosis not present

## 2016-06-16 DIAGNOSIS — F17210 Nicotine dependence, cigarettes, uncomplicated: Secondary | ICD-10-CM | POA: Diagnosis not present

## 2016-06-16 DIAGNOSIS — I10 Essential (primary) hypertension: Secondary | ICD-10-CM | POA: Diagnosis not present

## 2016-06-23 DIAGNOSIS — M47817 Spondylosis without myelopathy or radiculopathy, lumbosacral region: Secondary | ICD-10-CM | POA: Diagnosis not present

## 2016-06-23 DIAGNOSIS — M25561 Pain in right knee: Secondary | ICD-10-CM | POA: Diagnosis not present

## 2016-06-23 DIAGNOSIS — F411 Generalized anxiety disorder: Secondary | ICD-10-CM | POA: Diagnosis not present

## 2016-06-23 DIAGNOSIS — M25562 Pain in left knee: Secondary | ICD-10-CM | POA: Diagnosis not present

## 2016-06-23 DIAGNOSIS — M4807 Spinal stenosis, lumbosacral region: Secondary | ICD-10-CM | POA: Diagnosis not present

## 2016-06-23 DIAGNOSIS — M5415 Radiculopathy, thoracolumbar region: Secondary | ICD-10-CM | POA: Diagnosis not present

## 2016-06-23 DIAGNOSIS — M5137 Other intervertebral disc degeneration, lumbosacral region: Secondary | ICD-10-CM | POA: Diagnosis not present

## 2016-06-23 DIAGNOSIS — M545 Low back pain: Secondary | ICD-10-CM | POA: Diagnosis not present

## 2016-06-23 DIAGNOSIS — M150 Primary generalized (osteo)arthritis: Secondary | ICD-10-CM | POA: Diagnosis not present

## 2016-06-23 DIAGNOSIS — M549 Dorsalgia, unspecified: Secondary | ICD-10-CM | POA: Diagnosis not present

## 2016-06-23 DIAGNOSIS — Z79899 Other long term (current) drug therapy: Secondary | ICD-10-CM | POA: Diagnosis not present

## 2016-06-23 DIAGNOSIS — M5417 Radiculopathy, lumbosacral region: Secondary | ICD-10-CM | POA: Diagnosis not present

## 2016-07-09 DIAGNOSIS — Z96651 Presence of right artificial knee joint: Secondary | ICD-10-CM | POA: Diagnosis not present

## 2016-07-09 DIAGNOSIS — Z471 Aftercare following joint replacement surgery: Secondary | ICD-10-CM | POA: Diagnosis not present

## 2016-08-18 DIAGNOSIS — M4807 Spinal stenosis, lumbosacral region: Secondary | ICD-10-CM | POA: Diagnosis not present

## 2016-08-18 DIAGNOSIS — M47817 Spondylosis without myelopathy or radiculopathy, lumbosacral region: Secondary | ICD-10-CM | POA: Diagnosis not present

## 2016-08-18 DIAGNOSIS — M25561 Pain in right knee: Secondary | ICD-10-CM | POA: Diagnosis not present

## 2016-08-18 DIAGNOSIS — M5137 Other intervertebral disc degeneration, lumbosacral region: Secondary | ICD-10-CM | POA: Diagnosis not present

## 2016-08-18 DIAGNOSIS — Z79891 Long term (current) use of opiate analgesic: Secondary | ICD-10-CM | POA: Diagnosis not present

## 2016-08-18 DIAGNOSIS — F411 Generalized anxiety disorder: Secondary | ICD-10-CM | POA: Diagnosis not present

## 2016-08-18 DIAGNOSIS — M797 Fibromyalgia: Secondary | ICD-10-CM | POA: Diagnosis not present

## 2016-08-18 DIAGNOSIS — M25562 Pain in left knee: Secondary | ICD-10-CM | POA: Diagnosis not present

## 2016-08-18 DIAGNOSIS — M545 Low back pain: Secondary | ICD-10-CM | POA: Diagnosis not present

## 2016-08-18 DIAGNOSIS — M549 Dorsalgia, unspecified: Secondary | ICD-10-CM | POA: Diagnosis not present

## 2016-08-18 DIAGNOSIS — M791 Myalgia: Secondary | ICD-10-CM | POA: Diagnosis not present

## 2016-09-10 ENCOUNTER — Other Ambulatory Visit: Payer: Self-pay | Admitting: Physician Assistant

## 2016-09-10 ENCOUNTER — Other Ambulatory Visit: Payer: Self-pay | Admitting: Nurse Practitioner

## 2016-09-10 DIAGNOSIS — Z1231 Encounter for screening mammogram for malignant neoplasm of breast: Secondary | ICD-10-CM

## 2016-10-16 DIAGNOSIS — E119 Type 2 diabetes mellitus without complications: Secondary | ICD-10-CM | POA: Diagnosis not present

## 2016-10-16 DIAGNOSIS — E782 Mixed hyperlipidemia: Secondary | ICD-10-CM | POA: Diagnosis not present

## 2016-10-16 DIAGNOSIS — I10 Essential (primary) hypertension: Secondary | ICD-10-CM | POA: Diagnosis not present

## 2016-10-16 DIAGNOSIS — Z0001 Encounter for general adult medical examination with abnormal findings: Secondary | ICD-10-CM | POA: Diagnosis not present

## 2016-10-16 DIAGNOSIS — I6523 Occlusion and stenosis of bilateral carotid arteries: Secondary | ICD-10-CM | POA: Diagnosis not present

## 2016-10-16 DIAGNOSIS — F330 Major depressive disorder, recurrent, mild: Secondary | ICD-10-CM | POA: Diagnosis not present

## 2016-10-16 DIAGNOSIS — F17210 Nicotine dependence, cigarettes, uncomplicated: Secondary | ICD-10-CM | POA: Diagnosis not present

## 2016-10-23 ENCOUNTER — Ambulatory Visit
Admission: RE | Admit: 2016-10-23 | Discharge: 2016-10-23 | Disposition: A | Discharge status: Home / Self Care | Payer: PPO | Source: Ambulatory Visit | Attending: Nurse Practitioner | Admitting: Nurse Practitioner

## 2016-10-23 DIAGNOSIS — Z1231 Encounter for screening mammogram for malignant neoplasm of breast: Secondary | ICD-10-CM | POA: Insufficient documentation

## 2016-10-27 DIAGNOSIS — Z79891 Long term (current) use of opiate analgesic: Secondary | ICD-10-CM | POA: Diagnosis not present

## 2016-10-27 DIAGNOSIS — Z79899 Other long term (current) drug therapy: Secondary | ICD-10-CM | POA: Diagnosis not present

## 2016-10-27 DIAGNOSIS — M549 Dorsalgia, unspecified: Secondary | ICD-10-CM | POA: Diagnosis not present

## 2016-10-27 DIAGNOSIS — M4807 Spinal stenosis, lumbosacral region: Secondary | ICD-10-CM | POA: Diagnosis not present

## 2016-10-27 DIAGNOSIS — M5417 Radiculopathy, lumbosacral region: Secondary | ICD-10-CM | POA: Diagnosis not present

## 2016-10-27 DIAGNOSIS — M5137 Other intervertebral disc degeneration, lumbosacral region: Secondary | ICD-10-CM | POA: Diagnosis not present

## 2016-10-27 DIAGNOSIS — M25561 Pain in right knee: Secondary | ICD-10-CM | POA: Diagnosis not present

## 2016-10-27 DIAGNOSIS — M25562 Pain in left knee: Secondary | ICD-10-CM | POA: Diagnosis not present

## 2016-10-27 DIAGNOSIS — M47817 Spondylosis without myelopathy or radiculopathy, lumbosacral region: Secondary | ICD-10-CM | POA: Diagnosis not present

## 2016-10-27 DIAGNOSIS — M545 Low back pain: Secondary | ICD-10-CM | POA: Diagnosis not present

## 2016-10-27 DIAGNOSIS — F411 Generalized anxiety disorder: Secondary | ICD-10-CM | POA: Diagnosis not present

## 2016-11-05 DIAGNOSIS — I6523 Occlusion and stenosis of bilateral carotid arteries: Secondary | ICD-10-CM | POA: Diagnosis not present

## 2016-12-04 DIAGNOSIS — F17210 Nicotine dependence, cigarettes, uncomplicated: Secondary | ICD-10-CM | POA: Diagnosis not present

## 2016-12-04 DIAGNOSIS — G4733 Obstructive sleep apnea (adult) (pediatric): Secondary | ICD-10-CM | POA: Diagnosis not present

## 2016-12-04 DIAGNOSIS — J301 Allergic rhinitis due to pollen: Secondary | ICD-10-CM | POA: Diagnosis not present

## 2016-12-22 DIAGNOSIS — Z79899 Other long term (current) drug therapy: Secondary | ICD-10-CM | POA: Diagnosis not present

## 2016-12-22 DIAGNOSIS — M47817 Spondylosis without myelopathy or radiculopathy, lumbosacral region: Secondary | ICD-10-CM | POA: Diagnosis not present

## 2016-12-22 DIAGNOSIS — M25561 Pain in right knee: Secondary | ICD-10-CM | POA: Diagnosis not present

## 2016-12-22 DIAGNOSIS — M25562 Pain in left knee: Secondary | ICD-10-CM | POA: Diagnosis not present

## 2016-12-22 DIAGNOSIS — Z79891 Long term (current) use of opiate analgesic: Secondary | ICD-10-CM | POA: Diagnosis not present

## 2016-12-22 DIAGNOSIS — M4807 Spinal stenosis, lumbosacral region: Secondary | ICD-10-CM | POA: Diagnosis not present

## 2016-12-22 DIAGNOSIS — M5137 Other intervertebral disc degeneration, lumbosacral region: Secondary | ICD-10-CM | POA: Diagnosis not present

## 2016-12-22 DIAGNOSIS — F411 Generalized anxiety disorder: Secondary | ICD-10-CM | POA: Diagnosis not present

## 2016-12-22 DIAGNOSIS — M549 Dorsalgia, unspecified: Secondary | ICD-10-CM | POA: Diagnosis not present

## 2016-12-22 DIAGNOSIS — M545 Low back pain: Secondary | ICD-10-CM | POA: Diagnosis not present

## 2017-01-15 DIAGNOSIS — J301 Allergic rhinitis due to pollen: Secondary | ICD-10-CM | POA: Diagnosis not present

## 2017-02-16 ENCOUNTER — Emergency Department
Admission: EM | Admit: 2017-02-16 | Discharge: 2017-02-16 | Disposition: A | Discharge status: Home / Self Care | Payer: PPO | Attending: Emergency Medicine | Admitting: Emergency Medicine

## 2017-02-16 ENCOUNTER — Emergency Department: Payer: PPO

## 2017-02-16 ENCOUNTER — Encounter: Payer: Self-pay | Admitting: *Deleted

## 2017-02-16 DIAGNOSIS — R079 Chest pain, unspecified: Secondary | ICD-10-CM | POA: Diagnosis not present

## 2017-02-16 DIAGNOSIS — E039 Hypothyroidism, unspecified: Secondary | ICD-10-CM | POA: Diagnosis not present

## 2017-02-16 DIAGNOSIS — I10 Essential (primary) hypertension: Secondary | ICD-10-CM | POA: Insufficient documentation

## 2017-02-16 DIAGNOSIS — F17210 Nicotine dependence, cigarettes, uncomplicated: Secondary | ICD-10-CM | POA: Diagnosis not present

## 2017-02-16 DIAGNOSIS — Z85828 Personal history of other malignant neoplasm of skin: Secondary | ICD-10-CM | POA: Diagnosis not present

## 2017-02-16 DIAGNOSIS — F17200 Nicotine dependence, unspecified, uncomplicated: Secondary | ICD-10-CM | POA: Diagnosis not present

## 2017-02-16 DIAGNOSIS — R7989 Other specified abnormal findings of blood chemistry: Secondary | ICD-10-CM | POA: Diagnosis not present

## 2017-02-16 DIAGNOSIS — Z7982 Long term (current) use of aspirin: Secondary | ICD-10-CM | POA: Diagnosis not present

## 2017-02-16 DIAGNOSIS — R0789 Other chest pain: Secondary | ICD-10-CM | POA: Diagnosis not present

## 2017-02-16 LAB — BASIC METABOLIC PANEL
ANION GAP: 6 (ref 5–15)
BUN: 11 mg/dL (ref 6–20)
CO2: 27 mmol/L (ref 22–32)
Calcium: 10.2 mg/dL (ref 8.9–10.3)
Chloride: 108 mmol/L (ref 101–111)
Creatinine, Ser: 0.6 mg/dL (ref 0.44–1.00)
Glucose, Bld: 103 mg/dL — ABNORMAL HIGH (ref 65–99)
POTASSIUM: 3.9 mmol/L (ref 3.5–5.1)
Sodium: 141 mmol/L (ref 135–145)

## 2017-02-16 LAB — CBC
HCT: 41.4 % (ref 35.0–47.0)
HEMOGLOBIN: 14.4 g/dL (ref 12.0–16.0)
MCH: 32.8 pg (ref 26.0–34.0)
MCHC: 34.8 g/dL (ref 32.0–36.0)
MCV: 94.3 fL (ref 80.0–100.0)
Platelets: 118 10*3/uL — ABNORMAL LOW (ref 150–440)
RBC: 4.39 MIL/uL (ref 3.80–5.20)
RDW: 12.7 % (ref 11.5–14.5)
WBC: 6.8 10*3/uL (ref 3.6–11.0)

## 2017-02-16 LAB — TROPONIN I: Troponin I: <0.03 ng/mL (ref <0.03)

## 2017-02-16 MED ORDER — CARVEDILOL 6.25 MG PO TABS
6.2500 mg | ORAL_TABLET | Freq: Two times a day (BID) | ORAL | Status: DC
  Administered 2017-02-16: 6.25 mg via ORAL
  Filled 2017-02-16: qty 1

## 2017-02-16 MED ORDER — ASPIRIN 81 MG PO CHEW
324.0000 mg | CHEWABLE_TABLET | Freq: Once | ORAL | Status: AC
  Administered 2017-02-16: 324 mg via ORAL
  Filled 2017-02-16: qty 4

## 2017-02-16 NOTE — ED Provider Notes (Signed)
[facility] Emergency Department Provider Note ____________________________________________   I have reviewed the triage vital signs and the triage nursing note.  HISTORY  Chief Complaint Hypertension and Chest Pain   Historian Patient and Jocelyn husband in the room  HPI [name] is a 76 y.o. female has a history of hypertension, states that this morning she felt exhausted when she woke up and continued to rest and then when she got up in the afternoon she took Jocelyn blood pressure because she had a blood pressure cuff and it showed elevated 180s to 190s over 80s.After that patient started developing some mild chest discomfort over the left upper chest wall and over to the upper left arm. She still feels a little discomfort there. She takes 40 mg Lasix daily and occasionally , This morning she just took Jocelyn 40 mg Lasix tablet. She also takes Coreg 6.25 mg twice daily and had Jocelyn blood pressure medication this morning. This weekend she was involved in a wedding and was very exhausted. She had planned the wedding for [name] who is 76 years old.  In any case, patient was mostly upset about the elevated blood pressure. She is overall well-appearing and asking to go home.    Past Medical History:  Diagnosis Date  . Allergy   . Anemia   . Cancer ([place])    skin cancer  . Hypertension   . Hypothyroidism   . Sciatica   . Sleep apnea   . Thyroid disease     Patient Active Problem List   Diagnosis Date Noted  . 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 S/P revision of total knee 08/13/2015    Past Surgical History:  Procedure Laterality Date  . ABDOMINAL HYSTERECTOMY    . APPENDECTOMY    . BACK SURGERY    . BILATERAL CARPAL TUNNEL RELEASE    . BREAST CYST ASPIRATION Left    neg  . COLONOSCOPY    . JOINT REPLACEMENT    . KNEE SURGERY Bilateral   . KNEE SURGERY    . TOTAL KNEE REVISION Right 08/13/2015   Procedure: TOTAL KNEE REVISION;   Surgeon: Paralee Cancel, MD;  Location: [facility];  Service: Orthopedics;  Laterality: Right;    Prior to Admission medications   Medication Sig Start Date End Date Taking? Authorizing Provider  ALPRAZolam Duanne Moron) 0.5 MG tablet Take 0.5 mg by mouth at bedtime as needed for anxiety.    [provider]  aspirin EC 325 MG EC tablet Take 1 tablet (325 mg total) by mouth 2 (two) times daily. 08/15/15   Danae Orleans, PA-C  carvedilol (COREG) 6.25 MG tablet Take 6.25 mg by mouth 2 (two) times daily with a meal.    [provider]  cetirizine (ZYRTEC) 10 MG tablet Take 10 mg by mouth daily.    [provider]  citalopram (CELEXA) 20 MG tablet Take 20 mg by mouth daily.    [provider]  docusate sodium (COLACE) 100 MG capsule Take 1 capsule (100 mg total) by mouth 2 (two) times daily. 08/15/15   Danae Orleans, PA-C  ferrous sulfate 325 (65 FE) MG tablet Take 1 tablet (325 mg total) by mouth 3 (three) times daily after meals. 08/15/15   Danae Orleans, PA-C  furosemide (LASIX) 20 MG tablet Take 20 mg by mouth daily.    [provider]  gemfibrozil (LOPID) 600 MG tablet Take 600 mg by mouth 2 (two) times daily before a  meal.    [provider]  levothyroxine (LEVOTHROID) 125 MCG tablet Take 125 mcg by mouth daily before breakfast.    [provider]  lisinopril (PRINIVIL,ZESTRIL) 10 MG tablet Take 10 mg by mouth daily.  01/27/14   [provider]  methocarbamol (ROBAXIN) 500 MG tablet Take 1 tablet (500 mg total) by mouth every 6 (six) hours as needed for muscle spasms. 08/15/15   Danae Orleans, PA-C  oxyCODONE (OXY IR/ROXICODONE) 5 MG immediate release tablet Take 1-3 tablets (5-15 mg total) by mouth every 4 (four) hours as needed for severe pain. 08/15/15   Danae Orleans, PA-C  OxyCODONE HCl, Abuse Deter, 5 MG TABA Take 5 mg by mouth 2 (two) times daily.     [provider]  polyethylene glycol (MIRALAX / GLYCOLAX)  packet Take 17 g by mouth 2 (two) times daily. 08/15/15   Danae Orleans, PA-C  simvastatin (ZOCOR) 40 MG tablet Take 40 mg by mouth daily.    [provider]  traZODone (DESYREL) 50 MG tablet Take 50 mg by mouth at bedtime.    [provider]    Allergies  Allergen Reactions  . Codeine Nausea Only    Tolerates oxycodone  . Morphine Nausea And Vomiting  . Adhesive [Tape] Other (See Comments) and Rash    whelps    Family History  Problem Relation Age of Onset  . Breast cancer Paternal Aunt     Social History Social History  Substance Use Topics  . Smoking status: Current Every Day Smoker    Packs/day: 0.50    Years: 20.00  . Smokeless tobacco: Never Used  . Alcohol use No    Review of Systems  Constitutional: Negative for fever.  Positive for fatigue. Eyes: Negative for visual changes. ENT: Negative for sore throat. Cardiovascular: Negative for Palpitations. Respiratory: Negative for shortness of breath. Gastrointestinal: Negative for abdominal pain, vomiting and diarrhea. Genitourinary: Negative for dysuria. Musculoskeletal: Negative for back pain. Skin: Negative for rash. Neurological: Negative for headache.  ____________________________________________   PHYSICAL EXAM:  VITAL SIGNS: ED Triage Vitals  Enc Vitals Group     BP 02/16/17 1319 (!) 195/80     Pulse Rate 02/16/17 1319 68     Resp 02/16/17 1319 16     Temp 02/16/17 1319 98.3 F (36.8 C)     Temp Source 02/16/17 1319 Oral     SpO2 02/16/17 1319 96 %     Weight 02/16/17 1320 160 lb (72.6 kg)     Height 02/16/17 1320 5' 3.5" (1.613 m)     Head Circumference --      Peak Flow --      Pain Score 02/16/17 1322 3     Pain Loc --      Pain Edu? --      Excl. in [place]? --      Constitutional: Alert and oriented. Well appearing and in no distress. HEENT   Head: Normocephalic and atraumatic.      Eyes: Conjunctivae are normal. Pupils equal and round.       Ears:         Nose:  No congestion/rhinnorhea.   Mouth/Throat: Mucous membranes are moist.   Neck: No stridor. Cardiovascular/Chest: Normal rate, regular rhythm.  No murmurs, rubs, or gallops. Respiratory: Normal respiratory effort without tachypnea nor retractions. Breath sounds are clear and equal bilaterally. No wheezes/rales/rhonchi. Gastrointestinal: Soft. No distention, no guarding, no rebound. Nontender.    Genitourinary/rectal:Deferred Musculoskeletal: Nontender with normal range of  motion in all extremities. No joint effusions.  No lower extremity tenderness.  No edema. Neurologic:  Normal speech and language. No gross or focal neurologic deficits are appreciated. Skin:  Skin is warm, dry and intact. No rash noted. Psychiatric: Mood and affect are normal. Speech and behavior are normal. Patient exhibits appropriate insight and judgment.   ____________________________________________  LABS (pertinent positives/negatives)  Labs Reviewed  BASIC METABOLIC PANEL - Abnormal; Notable for the following:       Result Value   Glucose, Bld 103 (*)    All other components within normal limits  CBC - Abnormal; Notable for the following:    Platelets 118 (*)    All other components within normal limits  TROPONIN I  TROPONIN I    ____________________________________________    EKG I, Lisa Roca, MD, the attending physician have personally viewed and interpreted all ECGs.  Interference with known internal stimulator, but appears to be normal sinus rhythm with narrow QRS. Nonspecific T-wave/ST segment.  When compared with an EKG from 08/03/2015, I think the nonspecific findings are similar. ____________________________________________  RADIOLOGY All Xrays were viewed by me. Imaging interpreted by Radiologist.  CXR 2 view:  FINDINGS: There are mild bilateral chronic bronchitic changes. There is no focal parenchymal opacity. There is no pleural effusion or pneumothorax. The heart and  mediastinal contours are unremarkable.  The osseous structures are unremarkable. Spinal stimulator electrode projects over the posterior thoracic spinal canal from T8 through the midbody of T10.  IMPRESSION: No active cardiopulmonary disease. __________________________________________  PROCEDURES  Procedure(s) performed: None  Critical Care performed: None  ____________________________________________   ED COURSE / ASSESSMENT AND PLAN  Pertinent labs & imaging results that were available during my care of the patient were reviewed by me and considered in my medical decision making (see chart for details).   Ms. Halpin is here because she became nervous about Jocelyn blood pressure being elevated. She is treated for high blood pressure and blood pressures were in the 190s over 80s. They're here in the emergency department as well. She developed some chest discomfort which is what finally caused Jocelyn to come to the ED. Jocelyn EKG appears to be likely unchanged from prior. Jocelyn initial troponin is negative. I did discuss with Jocelyn obtaining repeat troponin. I'm going to give Jocelyn an extra dose of carvedilol. It sounds like she was extremely exhausted after a really big weekend in terms of Jocelyn complaint of overall fatigue. There is no focal neurologic deficits or complaints.  ----------------------------------------- 3:33 PM on 02/16/2017 -----------------------------------------   OBSERVATION CARE: This patient is being placed under observation care for the following reasons: Chest pain with repeat testing to rule out ischemia     Patient continues to rest comfortably, stable vital signs, BP improving.    ----------------------------------------- 6:31 PM on 02/16/2017 -----------------------------------------   END OF OBSERVATION STATUS: After an appropriate period of observation, this patient is being discharged due to the following reason(s):  Reassuring exam, repeat troponin  negative, ok for discharge.      CONSULTATIONS:   None   Patient / Family / Caregiver informed of clinical course, medical decision-making process, and agree with plan.   I discussed return precautions, follow-up instructions, and discharge instructions with patient and/or family.  Discharge Instructions : You were evaluated for chest discomfort and elevated blood pressures, and although no certain cause was found, and your exam and evaluation are reassuring in the emergency department today.  Return to the emergency room immediately for any  worsening chest pain, certainly any associated nausea, trouble breathing, weakness, numbness, confusion altered mental status, or any other symptoms concerning to you.  As we discussed, please call cardiology office for follow-up in the next 1-2 days. Please also follow up with the primary care doctor as scheduled for tomorrow.  ___________________________________________   FINAL CLINICAL IMPRESSION(S) / ED DIAGNOSES   Final diagnoses:  Hypertension, unspecified type  Atypical chest pain              Note: This dictation was prepared with Dragon dictation. Any transcriptional errors that result from this process are unintentional    Lisa Roca, MD 02/16/17 1836

## 2017-02-16 NOTE — Discharge Instructions (Signed)
You were evaluated for chest discomfort and elevated blood pressures, and although no certain cause was found, and your exam and evaluation are reassuring in the emergency department today.  Return to the emergency room immediately for any worsening chest pain, certainly any associated nausea, trouble breathing, weakness, numbness, confusion altered mental status, or any other symptoms concerning to you.  As we discussed, please call cardiology office for follow-up in the next 1-2 days. Please also follow up with the primary care doctor as scheduled for tomorrow.

## 2017-02-16 NOTE — ED Triage Notes (Signed)
Per patient's report, patient felt tired about waking this morning and after taking a morning nap, took her blood pressure which was 185/85. Patient c/o mild left-side chest pain and left arm pain. Patient denies cardiac history.

## 2017-02-17 DIAGNOSIS — E782 Mixed hyperlipidemia: Secondary | ICD-10-CM | POA: Diagnosis not present

## 2017-02-17 DIAGNOSIS — F17210 Nicotine dependence, cigarettes, uncomplicated: Secondary | ICD-10-CM | POA: Diagnosis not present

## 2017-02-17 DIAGNOSIS — J301 Allergic rhinitis due to pollen: Secondary | ICD-10-CM | POA: Diagnosis not present

## 2017-02-17 DIAGNOSIS — I10 Essential (primary) hypertension: Secondary | ICD-10-CM | POA: Diagnosis not present

## 2017-02-20 DIAGNOSIS — J069 Acute upper respiratory infection, unspecified: Secondary | ICD-10-CM | POA: Diagnosis not present

## 2017-02-20 DIAGNOSIS — F17210 Nicotine dependence, cigarettes, uncomplicated: Secondary | ICD-10-CM | POA: Diagnosis not present

## 2017-02-20 DIAGNOSIS — R05 Cough: Secondary | ICD-10-CM | POA: Diagnosis not present

## 2017-02-20 DIAGNOSIS — I10 Essential (primary) hypertension: Secondary | ICD-10-CM | POA: Diagnosis not present

## 2017-02-20 DIAGNOSIS — J301 Allergic rhinitis due to pollen: Secondary | ICD-10-CM | POA: Diagnosis not present

## 2017-03-16 DIAGNOSIS — F411 Generalized anxiety disorder: Secondary | ICD-10-CM | POA: Diagnosis not present

## 2017-03-16 DIAGNOSIS — M549 Dorsalgia, unspecified: Secondary | ICD-10-CM | POA: Diagnosis not present

## 2017-03-16 DIAGNOSIS — M5137 Other intervertebral disc degeneration, lumbosacral region: Secondary | ICD-10-CM | POA: Diagnosis not present

## 2017-03-16 DIAGNOSIS — M25561 Pain in right knee: Secondary | ICD-10-CM | POA: Diagnosis not present

## 2017-03-16 DIAGNOSIS — M25562 Pain in left knee: Secondary | ICD-10-CM | POA: Diagnosis not present

## 2017-03-16 DIAGNOSIS — M47817 Spondylosis without myelopathy or radiculopathy, lumbosacral region: Secondary | ICD-10-CM | POA: Diagnosis not present

## 2017-03-16 DIAGNOSIS — Z79891 Long term (current) use of opiate analgesic: Secondary | ICD-10-CM | POA: Diagnosis not present

## 2017-03-16 DIAGNOSIS — M545 Low back pain: Secondary | ICD-10-CM | POA: Diagnosis not present

## 2017-03-16 DIAGNOSIS — Z79899 Other long term (current) drug therapy: Secondary | ICD-10-CM | POA: Diagnosis not present

## 2017-03-16 DIAGNOSIS — M4807 Spinal stenosis, lumbosacral region: Secondary | ICD-10-CM | POA: Diagnosis not present

## 2017-03-31 DIAGNOSIS — I10 Essential (primary) hypertension: Secondary | ICD-10-CM | POA: Diagnosis not present

## 2017-03-31 DIAGNOSIS — F419 Anxiety disorder, unspecified: Secondary | ICD-10-CM | POA: Diagnosis not present

## 2017-03-31 DIAGNOSIS — E039 Hypothyroidism, unspecified: Secondary | ICD-10-CM | POA: Diagnosis not present

## 2017-03-31 DIAGNOSIS — E782 Mixed hyperlipidemia: Secondary | ICD-10-CM | POA: Diagnosis not present

## 2017-03-31 DIAGNOSIS — F17210 Nicotine dependence, cigarettes, uncomplicated: Secondary | ICD-10-CM | POA: Diagnosis not present

## 2017-03-31 DIAGNOSIS — J301 Allergic rhinitis due to pollen: Secondary | ICD-10-CM | POA: Diagnosis not present

## 2017-03-31 DIAGNOSIS — F330 Major depressive disorder, recurrent, mild: Secondary | ICD-10-CM | POA: Diagnosis not present

## 2017-04-15 ENCOUNTER — Ambulatory Visit: Payer: PPO | Admitting: Internal Medicine

## 2017-04-23 DIAGNOSIS — E7800 Pure hypercholesterolemia, unspecified: Secondary | ICD-10-CM | POA: Diagnosis not present

## 2017-04-23 DIAGNOSIS — I10 Essential (primary) hypertension: Secondary | ICD-10-CM | POA: Diagnosis not present

## 2017-04-23 DIAGNOSIS — E039 Hypothyroidism, unspecified: Secondary | ICD-10-CM | POA: Diagnosis not present

## 2017-04-23 DIAGNOSIS — E559 Vitamin D deficiency, unspecified: Secondary | ICD-10-CM | POA: Diagnosis not present

## 2017-05-13 DIAGNOSIS — Z85828 Personal history of other malignant neoplasm of skin: Secondary | ICD-10-CM | POA: Diagnosis not present

## 2017-05-13 DIAGNOSIS — D0472 Carcinoma in situ of skin of left lower limb, including hip: Secondary | ICD-10-CM | POA: Diagnosis not present

## 2017-05-13 DIAGNOSIS — Z872 Personal history of diseases of the skin and subcutaneous tissue: Secondary | ICD-10-CM | POA: Diagnosis not present

## 2017-05-13 DIAGNOSIS — Z08 Encounter for follow-up examination after completed treatment for malignant neoplasm: Secondary | ICD-10-CM | POA: Diagnosis not present

## 2017-05-13 DIAGNOSIS — D485 Neoplasm of uncertain behavior of skin: Secondary | ICD-10-CM | POA: Diagnosis not present

## 2017-05-15 DIAGNOSIS — D0472 Carcinoma in situ of skin of left lower limb, including hip: Secondary | ICD-10-CM | POA: Diagnosis not present

## 2017-05-18 DIAGNOSIS — Z96652 Presence of left artificial knee joint: Secondary | ICD-10-CM | POA: Diagnosis not present

## 2017-05-18 DIAGNOSIS — Z471 Aftercare following joint replacement surgery: Secondary | ICD-10-CM | POA: Diagnosis not present

## 2017-05-18 DIAGNOSIS — M25561 Pain in right knee: Secondary | ICD-10-CM | POA: Diagnosis not present

## 2017-05-18 DIAGNOSIS — Z96651 Presence of right artificial knee joint: Secondary | ICD-10-CM | POA: Diagnosis not present

## 2017-06-08 DIAGNOSIS — M47817 Spondylosis without myelopathy or radiculopathy, lumbosacral region: Secondary | ICD-10-CM | POA: Diagnosis not present

## 2017-06-08 DIAGNOSIS — M545 Low back pain: Secondary | ICD-10-CM | POA: Diagnosis not present

## 2017-06-08 DIAGNOSIS — M5137 Other intervertebral disc degeneration, lumbosacral region: Secondary | ICD-10-CM | POA: Diagnosis not present

## 2017-06-08 DIAGNOSIS — M25562 Pain in left knee: Secondary | ICD-10-CM | POA: Diagnosis not present

## 2017-06-08 DIAGNOSIS — M549 Dorsalgia, unspecified: Secondary | ICD-10-CM | POA: Diagnosis not present

## 2017-06-08 DIAGNOSIS — M4807 Spinal stenosis, lumbosacral region: Secondary | ICD-10-CM | POA: Diagnosis not present

## 2017-06-08 DIAGNOSIS — F411 Generalized anxiety disorder: Secondary | ICD-10-CM | POA: Diagnosis not present

## 2017-06-08 DIAGNOSIS — M25561 Pain in right knee: Secondary | ICD-10-CM | POA: Diagnosis not present

## 2017-06-11 DIAGNOSIS — F17210 Nicotine dependence, cigarettes, uncomplicated: Secondary | ICD-10-CM | POA: Diagnosis not present

## 2017-06-11 DIAGNOSIS — G4733 Obstructive sleep apnea (adult) (pediatric): Secondary | ICD-10-CM | POA: Diagnosis not present

## 2017-06-15 DIAGNOSIS — F411 Generalized anxiety disorder: Secondary | ICD-10-CM | POA: Diagnosis not present

## 2017-06-15 DIAGNOSIS — M545 Low back pain: Secondary | ICD-10-CM | POA: Diagnosis not present

## 2017-06-15 DIAGNOSIS — M47817 Spondylosis without myelopathy or radiculopathy, lumbosacral region: Secondary | ICD-10-CM | POA: Diagnosis not present

## 2017-06-15 DIAGNOSIS — M4807 Spinal stenosis, lumbosacral region: Secondary | ICD-10-CM | POA: Diagnosis not present

## 2017-06-15 DIAGNOSIS — M5137 Other intervertebral disc degeneration, lumbosacral region: Secondary | ICD-10-CM | POA: Diagnosis not present

## 2017-06-15 DIAGNOSIS — M25561 Pain in right knee: Secondary | ICD-10-CM | POA: Diagnosis not present

## 2017-06-15 DIAGNOSIS — M25562 Pain in left knee: Secondary | ICD-10-CM | POA: Diagnosis not present

## 2017-06-15 DIAGNOSIS — M549 Dorsalgia, unspecified: Secondary | ICD-10-CM | POA: Diagnosis not present

## 2017-06-19 DIAGNOSIS — M545 Low back pain: Secondary | ICD-10-CM | POA: Diagnosis not present

## 2017-07-06 DIAGNOSIS — T85193A Other mechanical complication of implanted electronic neurostimulator, generator, initial encounter: Secondary | ICD-10-CM | POA: Diagnosis not present

## 2017-07-06 DIAGNOSIS — T85192A Other mechanical complication of implanted electronic neurostimulator (electrode) of spinal cord, initial encounter: Secondary | ICD-10-CM | POA: Diagnosis not present

## 2017-07-06 DIAGNOSIS — F17210 Nicotine dependence, cigarettes, uncomplicated: Secondary | ICD-10-CM | POA: Diagnosis not present

## 2017-07-06 DIAGNOSIS — J449 Chronic obstructive pulmonary disease, unspecified: Secondary | ICD-10-CM | POA: Diagnosis not present

## 2017-07-06 DIAGNOSIS — M545 Low back pain: Secondary | ICD-10-CM | POA: Diagnosis not present

## 2017-07-06 DIAGNOSIS — Z79899 Other long term (current) drug therapy: Secondary | ICD-10-CM | POA: Diagnosis not present

## 2017-07-06 DIAGNOSIS — Z4542 Encounter for adjustment and management of neuropacemaker (brain) (peripheral nerve) (spinal cord): Secondary | ICD-10-CM | POA: Diagnosis not present

## 2017-07-30 DIAGNOSIS — E039 Hypothyroidism, unspecified: Secondary | ICD-10-CM | POA: Diagnosis not present

## 2017-07-30 DIAGNOSIS — F330 Major depressive disorder, recurrent, mild: Secondary | ICD-10-CM | POA: Diagnosis not present

## 2017-07-30 DIAGNOSIS — E782 Mixed hyperlipidemia: Secondary | ICD-10-CM | POA: Diagnosis not present

## 2017-07-30 DIAGNOSIS — I10 Essential (primary) hypertension: Secondary | ICD-10-CM | POA: Diagnosis not present

## 2017-07-30 DIAGNOSIS — J301 Allergic rhinitis due to pollen: Secondary | ICD-10-CM | POA: Diagnosis not present

## 2017-07-30 DIAGNOSIS — F419 Anxiety disorder, unspecified: Secondary | ICD-10-CM | POA: Diagnosis not present

## 2017-07-30 DIAGNOSIS — F17210 Nicotine dependence, cigarettes, uncomplicated: Secondary | ICD-10-CM | POA: Diagnosis not present

## 2017-08-06 DIAGNOSIS — E559 Vitamin D deficiency, unspecified: Secondary | ICD-10-CM | POA: Diagnosis not present

## 2017-08-06 DIAGNOSIS — I10 Essential (primary) hypertension: Secondary | ICD-10-CM | POA: Diagnosis not present

## 2017-08-06 DIAGNOSIS — E039 Hypothyroidism, unspecified: Secondary | ICD-10-CM | POA: Diagnosis not present

## 2017-08-06 DIAGNOSIS — Z0001 Encounter for general adult medical examination with abnormal findings: Secondary | ICD-10-CM | POA: Diagnosis not present

## 2017-08-06 DIAGNOSIS — E782 Mixed hyperlipidemia: Secondary | ICD-10-CM | POA: Diagnosis not present

## 2017-09-07 DIAGNOSIS — M5137 Other intervertebral disc degeneration, lumbosacral region: Secondary | ICD-10-CM | POA: Diagnosis not present

## 2017-09-07 DIAGNOSIS — F1120 Opioid dependence, uncomplicated: Secondary | ICD-10-CM | POA: Diagnosis not present

## 2017-09-07 DIAGNOSIS — M47817 Spondylosis without myelopathy or radiculopathy, lumbosacral region: Secondary | ICD-10-CM | POA: Diagnosis not present

## 2017-09-07 DIAGNOSIS — Z79899 Other long term (current) drug therapy: Secondary | ICD-10-CM | POA: Diagnosis not present

## 2017-09-07 DIAGNOSIS — M25561 Pain in right knee: Secondary | ICD-10-CM | POA: Diagnosis not present

## 2017-09-07 DIAGNOSIS — M25562 Pain in left knee: Secondary | ICD-10-CM | POA: Diagnosis not present

## 2017-09-07 DIAGNOSIS — F411 Generalized anxiety disorder: Secondary | ICD-10-CM | POA: Diagnosis not present

## 2017-09-07 DIAGNOSIS — M4807 Spinal stenosis, lumbosacral region: Secondary | ICD-10-CM | POA: Diagnosis not present

## 2017-09-07 DIAGNOSIS — M545 Low back pain: Secondary | ICD-10-CM | POA: Diagnosis not present

## 2017-09-07 DIAGNOSIS — M549 Dorsalgia, unspecified: Secondary | ICD-10-CM | POA: Diagnosis not present

## 2017-09-24 ENCOUNTER — Other Ambulatory Visit: Payer: Self-pay | Admitting: Internal Medicine

## 2017-09-24 DIAGNOSIS — Z1231 Encounter for screening mammogram for malignant neoplasm of breast: Secondary | ICD-10-CM

## 2017-09-25 ENCOUNTER — Other Ambulatory Visit: Payer: Self-pay | Admitting: Internal Medicine

## 2017-10-01 ENCOUNTER — Telehealth: Payer: Self-pay

## 2017-10-01 NOTE — Telephone Encounter (Signed)
[facility] called asking for a visit note between 06/07/17 - 09/05/17 because of a research the patient is in, so they have proof of O2 use. I faxed a note from 06/11/17 for Pulmonary, today 10/01/17.

## 2017-10-23 ENCOUNTER — Other Ambulatory Visit: Payer: Self-pay

## 2017-10-23 MED ORDER — CARVEDILOL 6.25 MG PO TABS: 6.2500 mg | ORAL_TABLET | Freq: Two times a day (BID) | ORAL | 1 refills | Status: DC

## 2017-10-26 ENCOUNTER — Ambulatory Visit
Admission: RE | Admit: 2017-10-26 | Discharge: 2017-10-26 | Disposition: A | Discharge status: Home / Self Care | Payer: PPO | Source: Ambulatory Visit | Attending: Internal Medicine | Admitting: Internal Medicine

## 2017-10-26 DIAGNOSIS — Z1231 Encounter for screening mammogram for malignant neoplasm of breast: Secondary | ICD-10-CM | POA: Diagnosis not present

## 2017-10-27 ENCOUNTER — Other Ambulatory Visit: Payer: Self-pay

## 2017-10-27 MED ORDER — MONTELUKAST SODIUM 10 MG PO TABS: 10.0000 mg | ORAL_TABLET | Freq: Every day | ORAL | 1 refills | Status: DC

## 2017-11-13 ENCOUNTER — Other Ambulatory Visit: Payer: Self-pay | Admitting: Internal Medicine

## 2017-11-13 MED ORDER — ALPRAZOLAM 0.5 MG PO TABS: 0.5000 mg | ORAL_TABLET | Freq: Two times a day (BID) | ORAL | 3 refills | Status: DC | PRN

## 2017-11-18 DIAGNOSIS — L821 Other seborrheic keratosis: Secondary | ICD-10-CM | POA: Diagnosis not present

## 2017-11-18 DIAGNOSIS — Z85828 Personal history of other malignant neoplasm of skin: Secondary | ICD-10-CM | POA: Diagnosis not present

## 2017-11-18 DIAGNOSIS — D1801 Hemangioma of skin and subcutaneous tissue: Secondary | ICD-10-CM | POA: Diagnosis not present

## 2017-11-18 DIAGNOSIS — Z872 Personal history of diseases of the skin and subcutaneous tissue: Secondary | ICD-10-CM | POA: Diagnosis not present

## 2017-11-21 ENCOUNTER — Other Ambulatory Visit: Payer: Self-pay | Admitting: Internal Medicine

## 2017-11-23 ENCOUNTER — Encounter: Payer: Self-pay | Admitting: Nurse Practitioner

## 2017-11-23 ENCOUNTER — Ambulatory Visit (INDEPENDENT_AMBULATORY_CARE_PROVIDER_SITE_OTHER): Payer: PPO | Admitting: Nurse Practitioner

## 2017-11-23 VITALS — BP 140/80 | HR 70 | Temp 98.1°F | Resp 16 | Ht 63.0 in | Wt 170.0 lb

## 2017-11-23 DIAGNOSIS — I10 Essential (primary) hypertension: Secondary | ICD-10-CM | POA: Diagnosis not present

## 2017-11-23 DIAGNOSIS — J0140 Acute pansinusitis, unspecified: Secondary | ICD-10-CM | POA: Diagnosis not present

## 2017-11-23 DIAGNOSIS — H66003 Acute suppurative otitis media without spontaneous rupture of ear drum, bilateral: Secondary | ICD-10-CM

## 2017-11-23 MED ORDER — AMOXICILLIN 875 MG PO TABS: 875.0000 mg | ORAL_TABLET | Freq: Two times a day (BID) | ORAL | 0 refills | Status: DC

## 2017-11-23 MED ORDER — NEOMYCIN-POLYMYXIN-HC 1 % OT SOLN: 3.0000 [drp] | Freq: Four times a day (QID) | OTIC | 0 refills | Status: DC

## 2017-11-23 NOTE — Progress Notes (Signed)
[facility] [place], [place] [address]  Internal MEDICINE  Office Visit Note  Patient Name: [name]  [id number]  [id number]  Date of Service: 11/23/2017  Chief Complaint  Patient presents with  . Sinusitis  . Otitis Media     The patient is here for sick visit. She states that her ears are "stopped up." can hardly hear out of her right ear at all. Everything sounds like an echo. States they don't hurt at all.  She also states that she also has some nasal congestion and sinus pain.    Pt is here for a sick visit.     Current Medication:  Outpatient Encounter Medications as of 11/23/2017  Medication Sig Note  . ALPRAZolam (XANAX) 0.5 MG tablet Take 1 tablet (0.5 mg total) by mouth 2 (two) times daily as needed for anxiety.   Marland Kitchen aspirin EC 325 MG EC tablet Take 1 tablet (325 mg total) by mouth 2 (two) times daily.   . carvedilol (COREG) 6.25 MG tablet Take 1 tablet (6.25 mg total) by mouth 2 (two) times daily with a meal.   . cetirizine (ZYRTEC) 10 MG tablet Take 10 mg by mouth daily.   . citalopram (CELEXA) 20 MG tablet Take 20 mg by mouth daily.   Marland Kitchen docusate sodium (COLACE) 100 MG capsule Take 1 capsule (100 mg total) by mouth 2 (two) times daily.   . ferrous sulfate 325 (65 FE) MG tablet Take 1 tablet (325 mg total) by mouth 3 (three) times daily after meals.   . furosemide (LASIX) 20 MG tablet Take 20 mg by mouth daily.   Marland Kitchen gemfibrozil (LOPID) 600 MG tablet Take 600 mg by mouth 2 (two) times daily before a meal.   . levothyroxine (LEVOTHROID) 125 MCG tablet Take 125 mcg by mouth daily before breakfast.   . methocarbamol (ROBAXIN) 500 MG tablet Take 1 tablet (500 mg total) by mouth every 6 (six) hours as needed for muscle spasms.   . montelukast (SINGULAIR) 10 MG tablet Take 1 tablet (10 mg total) by mouth daily.   Marland Kitchen oxyCODONE (OXY IR/ROXICODONE) 5 MG immediate release tablet Take 1-3 tablets (5-15 mg total) by mouth every 4 (four) hours as  needed for severe pain.   . polyethylene glycol (MIRALAX / GLYCOLAX) packet Take 17 g by mouth 2 (two) times daily.   . simvastatin (ZOCOR) 40 MG tablet TAKE 1 TABLET BY MOUTH EVERY NIGHT   . traZODone (DESYREL) 50 MG tablet TAKE 1 TABLET BY MOUTH AT BEDTIME   . amoxicillin (AMOXIL) 875 MG tablet Take 1 tablet (875 mg total) by mouth 2 (two) times daily.   . NEOMYCIN-POLYMYXIN-HYDROCORTISONE (CORTISPORIN) 1 % SOLN OTIC solution Place 3 drops into both ears every 6 (six) hours.   . OxyCODONE HCl, Abuse Deter, 5 MG TABA Take 5 mg by mouth 2 (two) times daily.    . [DISCONTINUED] furosemide (LASIX) 20 MG tablet TAKE 1 TABLET BY MOUTH EVERY DAY, MAY TAKE ONE EXTRA IF NEEDED   . [DISCONTINUED] gemfibrozil (LOPID) 600 MG tablet TAKE 1 TABLET BY MOUTH TWICE DAILY.   . [DISCONTINUED] lisinopril (PRINIVIL,ZESTRIL) 10 MG tablet Take 10 mg by mouth daily.  02/24/2014: Received from: External Pharmacy   No facility-administered encounter medications on file as of 11/23/2017.       Medical History: Past Medical History:  Diagnosis Date  . Allergy   . Anemia   . Cancer ([place])    skin cancer  . Hypertension   .  Hypothyroidism   . Sciatica   . Sleep apnea   . Thyroid disease     Today's Vitals   11/23/17 1409  BP: 140/80  Pulse: 70  Resp: 16  Temp: 98.1 F (36.7 C)  SpO2: 96%  Weight: 170 lb (77.1 kg)  Height: 5\' 3"  (1.6 m)    Review of Systems  Constitutional: Negative for chills, fatigue and fever.  HENT: Positive for congestion, ear discharge, ear pain, postnasal drip, rhinorrhea and sinus pain. Negative for sore throat and voice change.   Eyes: Negative.   Respiratory: Positive for cough and wheezing.   Cardiovascular: Negative for chest pain and palpitations.  Gastrointestinal: Negative for nausea and vomiting.  Endocrine: Negative.   Musculoskeletal: Negative.   Skin: Negative.   Allergic/Immunologic: Positive for environmental allergies.  Neurological: Positive for headaches.   Psychiatric/Behavioral: Negative.     Physical Exam  Constitutional: She is oriented to person, place, and time. She appears well-developed and well-nourished. No distress.  HENT:  Head: Normocephalic and atraumatic.  Right Ear: Tympanic membrane is erythematous and bulging. Decreased hearing is noted.  Left Ear: There is tenderness. Tympanic membrane is bulging.  Nose: Rhinorrhea present. Right sinus exhibits no maxillary sinus tenderness and no frontal sinus tenderness. Left sinus exhibits no maxillary sinus tenderness and no frontal sinus tenderness.  Mouth/Throat: Oropharynx is clear and moist. No oropharyngeal exudate.  Eyes: EOM are normal. Pupils are equal, round, and reactive to light.  Neck: Normal range of motion. Neck supple. No JVD present. No tracheal deviation present. No thyromegaly present.  Cardiovascular: Normal rate, regular rhythm and normal heart sounds. Exam reveals no gallop and no friction rub.  No murmur heard. Pulmonary/Chest: Effort normal and breath sounds normal. No respiratory distress. She has no wheezes. She has no rales. She exhibits no tenderness.  Abdominal: Soft. Bowel sounds are normal. There is no tenderness.  Musculoskeletal: Normal range of motion.  Lymphadenopathy:    She has cervical adenopathy.  Neurological: She is alert and oriented to person, place, and time. No cranial nerve deficit.  Skin: Skin is warm and dry. She is not diaphoretic.  Psychiatric: She has a normal mood and affect. Her behavior is normal. Judgment and thought content normal.  Nursing note and vitals reviewed.  Assessment/Plan:  1. Acute suppurative otitis media of both ears without spontaneous rupture of tympanic membranes, recurrence not specified -NEOMYCIN-POLYMYXIN-HYDROCORTISONE (CORTISPORIN) 1 % SOLN OTIC solution; Place 3 drops into both ears every 6 (six) hours.  Dispense: 10 mL; Refill: 0  2. Acute pansinusitis, recurrence not specified -amoxicillin (AMOXIL) 875  MG tablet; Take 1 tablet (875 mg total) by mouth 2 (two) times daily.  Dispense: 20 tablet; Refill: 0  3. Essential hypertension Stable. Continue bp medication as prescribed .  General Counseling: soffia doshier understanding of the findings of todays visit and agrees with plan of treatment. I have discussed any further diagnostic evaluation that may be needed or ordered today. We also reviewed her medications today. she has been encouraged to call the office with any questions or concerns that should arise related to todays visit.   Meds ordered this encounter  Medications  . amoxicillin (AMOXIL) 875 MG tablet    Sig: Take 1 tablet (875 mg total) by mouth 2 (two) times daily.    Dispense:  20 tablet    Refill:  0    Order Specific Question:   Supervising Provider    Answer:   Lavera Guise [[id number]]  . NEOMYCIN-POLYMYXIN-HYDROCORTISONE (CORTISPORIN)  1 % SOLN OTIC solution    Sig: Place 3 drops into both ears every 6 (six) hours.    Dispense:  10 mL    Refill:  0    Order Specific Question:   Supervising Provider    Answer:   Lavera Guise [[id number]]    Time spent: 15 Minutes

## 2017-12-04 ENCOUNTER — Encounter: Payer: Self-pay | Admitting: Nurse Practitioner

## 2017-12-07 ENCOUNTER — Ambulatory Visit: Payer: PPO | Admitting: Internal Medicine

## 2017-12-07 ENCOUNTER — Encounter: Payer: Self-pay | Admitting: Internal Medicine

## 2017-12-07 VITALS — BP 171/87 | HR 62 | Ht 63.0 in | Wt 168.8 lb

## 2017-12-07 DIAGNOSIS — F411 Generalized anxiety disorder: Secondary | ICD-10-CM | POA: Diagnosis not present

## 2017-12-07 DIAGNOSIS — J449 Chronic obstructive pulmonary disease, unspecified: Secondary | ICD-10-CM | POA: Diagnosis not present

## 2017-12-07 DIAGNOSIS — M545 Low back pain: Secondary | ICD-10-CM | POA: Diagnosis not present

## 2017-12-07 DIAGNOSIS — M4807 Spinal stenosis, lumbosacral region: Secondary | ICD-10-CM | POA: Diagnosis not present

## 2017-12-07 DIAGNOSIS — F17218 Nicotine dependence, cigarettes, with other nicotine-induced disorders: Secondary | ICD-10-CM

## 2017-12-07 DIAGNOSIS — G4733 Obstructive sleep apnea (adult) (pediatric): Secondary | ICD-10-CM | POA: Diagnosis not present

## 2017-12-07 DIAGNOSIS — M5137 Other intervertebral disc degeneration, lumbosacral region: Secondary | ICD-10-CM | POA: Diagnosis not present

## 2017-12-07 DIAGNOSIS — M549 Dorsalgia, unspecified: Secondary | ICD-10-CM | POA: Diagnosis not present

## 2017-12-07 DIAGNOSIS — G4734 Idiopathic sleep related nonobstructive alveolar hypoventilation: Secondary | ICD-10-CM

## 2017-12-07 DIAGNOSIS — J301 Allergic rhinitis due to pollen: Secondary | ICD-10-CM | POA: Diagnosis not present

## 2017-12-07 DIAGNOSIS — M47817 Spondylosis without myelopathy or radiculopathy, lumbosacral region: Secondary | ICD-10-CM | POA: Diagnosis not present

## 2017-12-07 DIAGNOSIS — Z79899 Other long term (current) drug therapy: Secondary | ICD-10-CM | POA: Diagnosis not present

## 2017-12-07 DIAGNOSIS — F1120 Opioid dependence, uncomplicated: Secondary | ICD-10-CM | POA: Diagnosis not present

## 2017-12-07 DIAGNOSIS — M25561 Pain in right knee: Secondary | ICD-10-CM | POA: Diagnosis not present

## 2017-12-07 NOTE — Patient Instructions (Signed)
Coping with Quitting Smoking Quitting smoking is a physical and mental challenge. You will face cravings, withdrawal symptoms, and temptation. Before quitting, work with your health care provider to make a plan that can help you cope. Preparation can help you quit and keep you from giving in. How can I cope with cravings? Cravings usually last for 5-10 minutes. If you get through it, the craving will pass. Consider taking the following actions to help you cope with cravings:  Keep your mouth busy: ? Chew sugar-free gum. ? Suck on hard candies or a straw. ? Brush your teeth.  Keep your hands and body busy: ? Immediately change to a different activity when you feel a craving. ? Squeeze or play with a ball. ? Do an activity or a hobby, like making bead jewelry, practicing needlepoint, or working with wood. ? Mix up your normal routine. ? Take a short exercise break. Go for a quick walk or run up and down stairs. ? Spend time in public places where smoking is not allowed.  Focus on doing something kind or helpful for someone else.  Call a friend or family member to talk during a craving.  Join a support group.  Call a quit line, such as 1-800-QUIT-NOW.  Talk with your health care provider about medicines that might help you cope with cravings and make quitting easier for you.  How can I deal with withdrawal symptoms? Your body may experience negative effects as it tries to get used to not having nicotine in the system. These effects are called withdrawal symptoms. They may include:  Feeling hungrier than normal.  Trouble concentrating.  Irritability.  Trouble sleeping.  Feeling depressed.  Restlessness and agitation.  Craving a cigarette.  To manage withdrawal symptoms:  Avoid places, people, and activities that trigger your cravings.  Remember why you want to quit.  Get plenty of sleep.  Avoid coffee and other caffeinated drinks. These may worsen some of your  symptoms.  How can I handle social situations? Social situations can be difficult when you are quitting smoking, especially in the first few weeks. To manage this, you can:  Avoid parties, bars, and other social situations where people might be smoking.  Avoid alcohol.  Leave right away if you have the urge to smoke.  Explain to your family and friends that you are quitting smoking. Ask for understanding and support.  Plan activities with friends or family where smoking is not an option.  What are some ways I can cope with stress? Wanting to smoke may cause stress, and stress can make you want to smoke. Find ways to manage your stress. Relaxation techniques can help. For example:  Breathe slowly and deeply, in through your nose and out through your mouth.  Listen to soothing, relaxing music.  Talk with a family member or friend about your stress.  Light a candle.  Soak in a bath or take a shower.  Think about a peaceful place.  What are some ways I can prevent weight gain? Be aware that many people gain weight after they quit smoking. However, not everyone does. To keep from gaining weight, have a plan in place before you quit and stick to the plan after you quit. Your plan should include:  Having healthy snacks. When you have a craving, it may help to: ? Eat plain popcorn, crunchy carrots, celery, or other cut vegetables. ? Chew sugar-free gum.  Changing how you eat: ? Eat small portion sizes at meals. ?   Eat 4-6 small meals throughout the day instead of 1-2 large meals a day. ? Be mindful when you eat. Do not watch television or do other things that might distract you as you eat.  Exercising regularly: ? Make time to exercise each day. If you do not have time for a long workout, do short bouts of exercise for 5-10 minutes several times a day. ? Do some form of strengthening exercise, like weight lifting, and some form of aerobic exercise, like running or  swimming.  Drinking plenty of water or other low-calorie or no-calorie drinks. Drink 6-8 glasses of water daily, or as much as instructed by your health care provider.  Summary  Quitting smoking is a physical and mental challenge. You will face cravings, withdrawal symptoms, and temptation to smoke again. Preparation can help you as you go through these challenges.  You can cope with cravings by keeping your mouth busy (such as by chewing gum), keeping your body and hands busy, and making calls to family, friends, or a helpline for people who want to quit smoking.  You can cope with withdrawal symptoms by avoiding places where people smoke, avoiding drinks with caffeine, and getting plenty of rest.  Ask your health care provider about the different ways to prevent weight gain, avoid stress, and handle social situations. This information is not intended to replace advice given to you by your health care provider. Make sure you discuss any questions you have with your health care provider. Document Released: 09/05/2016 Document Revised: 09/05/2016 Document Reviewed: 09/05/2016 Elsevier Interactive Patient Education  2018 Elsevier Inc.  

## 2017-12-07 NOTE — Progress Notes (Signed)
[facility] [place], [place] [address]  Pulmonary Sleep Medicine  Office Visit Note  Patient Name: [name] DOB: 09-10-1941 MRN [id number]  Date of Service: 12/07/2017  Complaints/HPI: Patient has history of COPD tobacco abuse ongoing allergic rhinitis sleep apnea presents for follow-up.  She is doing fairly well.  She has had some issues with her rhinitis she is taking her Singulair as well as taking Clarinex.  She uses inhalers regularly.  She is on oxygen therapy at nighttime she is currently not on CPAP.  She has not had any admissions to the hospital.  Denies having any fevers no nausea no vomiting no chest pain.  We once again discussed smoking cessation at length.  She continues to smoke she says that that is the only vice that she has at this point and is not really interested in quitting smoking.  ROS  General: (-) fever, (-) chills, (-) night sweats, (-) weakness Skin: (-) rashes, (-) itching,. Eyes: (-) visual changes, (-) redness, (-) itching. Nose and Sinuses: (-) nasal stuffiness or itchiness, (-) postnasal drip, (-) nosebleeds, (-) sinus trouble. Mouth and Throat: (-) sore throat, (-) hoarseness. Neck: (-) swollen glands, (-) enlarged thyroid, (-) neck pain. Respiratory: - cough, (-) bloody sputum, + shortness of breath, - wheezing. Cardiovascular: - ankle swelling, (-) chest pain. Lymphatic: (-) lymph node enlargement. Neurologic: (-) numbness, (-) tingling. Psychiatric: (-) anxiety, (-) depression   Current Medication: Outpatient Encounter Medications as of 12/07/2017  Medication Sig  . ALPRAZolam (XANAX) 0.5 MG tablet Take 1 tablet (0.5 mg total) by mouth 2 (two) times daily as needed for anxiety.  Marland Kitchen aspirin EC 325 MG EC tablet Take 1 tablet (325 mg total) by mouth 2 (two) times daily. (Patient taking differently: Take 325 mg by mouth every other day. )  . carvedilol (COREG) 6.25 MG tablet Take 1 tablet (6.25 mg total) by mouth 2  (two) times daily with a meal.  . cetirizine (ZYRTEC) 10 MG tablet Take 10 mg by mouth daily.  . citalopram (CELEXA) 20 MG tablet Take 20 mg by mouth daily.  Marland Kitchen gemfibrozil (LOPID) 600 MG tablet Take 600 mg by mouth 2 (two) times daily before a meal.  . levothyroxine (LEVOTHROID) 125 MCG tablet Take 125 mcg by mouth daily before breakfast.  . methocarbamol (ROBAXIN) 500 MG tablet Take 1 tablet (500 mg total) by mouth every 6 (six) hours as needed for muscle spasms. (Patient taking differently: Take 500 mg by mouth every 8 (eight) hours as needed for muscle spasms. )  . montelukast (SINGULAIR) 10 MG tablet Take 1 tablet (10 mg total) by mouth daily.  Marland Kitchen oxyCODONE (OXY IR/ROXICODONE) 5 MG immediate release tablet Take 1-3 tablets (5-15 mg total) by mouth every 4 (four) hours as needed for severe pain.  . simvastatin (ZOCOR) 40 MG tablet TAKE 1 TABLET BY MOUTH EVERY NIGHT  . traZODone (DESYREL) 50 MG tablet TAKE 1 TABLET BY MOUTH AT BEDTIME  . amoxicillin (AMOXIL) 875 MG tablet Take 1 tablet (875 mg total) by mouth 2 (two) times daily. (Patient not taking: Reported on 12/07/2017)  . docusate sodium (COLACE) 100 MG capsule Take 1 capsule (100 mg total) by mouth 2 (two) times daily. (Patient not taking: Reported on 12/07/2017)  . ferrous sulfate 325 (65 FE) MG tablet Take 1 tablet (325 mg total) by mouth 3 (three) times daily after meals. (Patient not taking: Reported on 12/07/2017)  . furosemide (LASIX) 20 MG tablet Take 20 mg  by mouth daily.  . NEOMYCIN-POLYMYXIN-HYDROCORTISONE (CORTISPORIN) 1 % SOLN OTIC solution Place 3 drops into both ears every 6 (six) hours. (Patient not taking: Reported on 12/07/2017)  . OxyCODONE HCl, Abuse Deter, 5 MG TABA Take 5 mg by mouth 2 (two) times daily.   . polyethylene glycol (MIRALAX / GLYCOLAX) packet Take 17 g by mouth 2 (two) times daily. (Patient not taking: Reported on 12/07/2017)   No facility-administered encounter medications on file as of 12/07/2017.      Surgical History: Past Surgical History:  Procedure Laterality Date  . ABDOMINAL HYSTERECTOMY    . APPENDECTOMY    . BACK SURGERY    . BILATERAL CARPAL TUNNEL RELEASE    . BREAST CYST ASPIRATION Left    neg  . COLONOSCOPY    . JOINT REPLACEMENT    . KNEE SURGERY Bilateral   . KNEE SURGERY    . TOTAL KNEE REVISION Right 08/13/2015   Procedure: TOTAL KNEE REVISION;  Surgeon: Paralee Cancel, MD;  Location: [facility]S;  Service: Orthopedics;  Laterality: Right;    Medical History: Past Medical History:  Diagnosis Date  . Allergy   . Anemia   . Cancer ([place])    skin cancer  . Hypertension   . Hypothyroidism   . Sciatica   . Sleep apnea   . Thyroid disease     Family History: Family History  Problem Relation Age of Onset  . Breast cancer Paternal Aunt   . Dementia Mother     Social History: Social History   Socioeconomic History  . Marital status: Married    Spouse name: Not on file  . Number of children: Not on file  . Years of education: Not on file  . Highest education level: Not on file  Social Needs  . Financial resource strain: Not on file  . Food insecurity - worry: Not on file  . Food insecurity - inability: Not on file  . Transportation needs - medical: Not on file  . Transportation needs - non-medical: Not on file  Occupational History  . Not on file  Tobacco Use  . Smoking status: Current Every Day Smoker    Packs/day: 0.50    Years: 20.00    Pack years: 10.00  . Smokeless tobacco: Never Used  Substance and Sexual Activity  . Alcohol use: No  . Drug use: No  . Sexual activity: Not on file  Other Topics Concern  . Not on file  Social History Narrative  . Not on file    Vital Signs: Blood pressure (!) 171/87, pulse 62, height 5\' 3"  (1.6 m), weight 168 lb 12.8 oz (76.6 kg), SpO2 98 %.  Examination: General Appearance: The patient is well-developed, well-nourished, and in no distress. Skin: Gross inspection of skin unremarkable. Head:  normocephalic, no gross deformities. Eyes: no gross deformities noted. ENT: ears appear grossly normal no exudates. Neck: Supple. No thyromegaly. No LAD. Respiratory: scattered rhonchi. Cardiovascular: Normal S1 and S2 without murmur or rub. Extremities: No cyanosis. pulses are equal. Neurologic: Alert and oriented. No involuntary movements.  LABS: No results found for this or any previous visit (from the past 2160 hour(s)).  Radiology: Mm Screening Breast Tomo Bilateral  Result Date: 10/26/2017 CLINICAL DATA:  Screening. EXAM: 2D DIGITAL SCREENING BILATERAL MAMMOGRAM WITH 3D TOMO WITH CAD COMPARISON:  Previous exam(s). ACR Breast Density Category b: There are scattered areas of fibroglandular density. FINDINGS: There are no findings suspicious for malignancy. Images were processed with CAD. IMPRESSION: No mammographic  evidence of malignancy. A result letter of this screening mammogram will be mailed directly to the patient. RECOMMENDATION: Screening mammogram in one year. (Code:SM-B-01Y) BI-RADS CATEGORY  1: Negative. Electronically Signed   By: Franki Cabot M.D.   On: 10/26/2017 15:39    No results found.  No results found.    Assessment and Plan: Patient Active Problem List   Diagnosis Date Noted  . Hypertension 11/23/2017  . Acute suppurative otitis media of both ears without spontaneous rupture of tympanic membranes 11/23/2017  . Acute pansinusitis 11/23/2017  . 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 COPD  Follow-up pulmonary functions in August she will continue with present management.  Under fairly good control. Once again spoke with her at length about cigarette smoking and she does not really seem to be interested at this time to stop smoking 2. OSA on nocturnal oxygen she continues to be compliant this will be continued at this time. 3. Allergic Rhinitis  Continue with Singulair we will also continue with her Clarinex as  ordered 4. Oxygen at night continue with oxygen therapy at the present flow rates 5.  ongoing nicotine abuse once again discussed smoking cessation  General Counseling: I have discussed the findings of the evaluation and examination with Joaquim Lai.  I have also discussed any further diagnostic evaluation thatmay be needed or ordered today. Annya verbalizes understanding of the findings of todays visit. We also reviewed her medications today and discussed drug interactions and side effects including but not limited excessive drowsiness and altered mental states. We also discussed that there is always a risk not just to her but also people around her. she has been encouraged to call the office with any questions or concerns that should arise related to todays visit.    Time spent: 42min  I have personally obtained a history, examined the patient, evaluated laboratory and imaging results, formulated the assessment and plan and placed orders.    Allyne Gee, MD [facility] Pulmonary and Critical Care Sleep medicine

## 2017-12-14 ENCOUNTER — Other Ambulatory Visit: Payer: Self-pay | Admitting: Internal Medicine

## 2017-12-17 ENCOUNTER — Ambulatory Visit (INDEPENDENT_AMBULATORY_CARE_PROVIDER_SITE_OTHER): Payer: PPO | Admitting: Internal Medicine

## 2017-12-17 ENCOUNTER — Encounter: Payer: Self-pay | Admitting: Internal Medicine

## 2017-12-17 VITALS — BP 140/80 | HR 65 | Resp 16 | Ht 63.5 in | Wt 168.8 lb

## 2017-12-17 DIAGNOSIS — I10 Essential (primary) hypertension: Secondary | ICD-10-CM | POA: Diagnosis not present

## 2017-12-17 DIAGNOSIS — Z720 Tobacco use: Secondary | ICD-10-CM | POA: Diagnosis not present

## 2017-12-17 DIAGNOSIS — Z0001 Encounter for general adult medical examination with abnormal findings: Secondary | ICD-10-CM | POA: Diagnosis not present

## 2017-12-17 DIAGNOSIS — F321 Major depressive disorder, single episode, moderate: Secondary | ICD-10-CM | POA: Diagnosis not present

## 2017-12-17 DIAGNOSIS — R6889 Other general symptoms and signs: Secondary | ICD-10-CM | POA: Diagnosis not present

## 2017-12-17 DIAGNOSIS — E782 Mixed hyperlipidemia: Secondary | ICD-10-CM

## 2017-12-17 DIAGNOSIS — R300 Dysuria: Secondary | ICD-10-CM

## 2017-12-17 DIAGNOSIS — E039 Hypothyroidism, unspecified: Secondary | ICD-10-CM

## 2017-12-17 DIAGNOSIS — R0982 Postnasal drip: Secondary | ICD-10-CM

## 2017-12-17 MED ORDER — AZELASTINE HCL 0.15 % NA SOLN: NASAL | 3 refills | Status: DC

## 2017-12-17 NOTE — Progress Notes (Signed)
[facility] [place], [place] [address]  Internal MEDICINE  Office Visit Note  Patient Name: [name]  [id number]  [id number]  Date of Service: 12/31/2017  Chief Complaint  Patient presents with  . Annual Exam  . Allergic Rhinitis   . Hypertension  . Hyperlipidemia  . Osteoarthritis    HPI Pt is here for routine health maintenance examination. She has multiple medical problems however she is in chronic stable condition. Constant postnasal drip and clearing of her throat. Spinal nerve stimulator was just placed for chronic lower back pain. She does c/o being cold all the time. Denies any fever or chills  Current Medication: Outpatient Encounter Medications as of 12/17/2017  Medication Sig  . ALPRAZolam (XANAX) 0.5 MG tablet Take 1 tablet (0.5 mg total) by mouth 2 (two) times daily as needed for anxiety.  Marland Kitchen amLODipine (NORVASC) 2.5 MG tablet TK 1 T PO QD FOR BP  . aspirin EC 325 MG EC tablet Take 1 tablet (325 mg total) by mouth 2 (two) times daily. (Patient taking differently: Take 325 mg by mouth every other day. )  . Azelastine HCl 0.15 % SOLN 2 sq in each nostril 2 x day  . carvedilol (COREG) 6.25 MG tablet Take 1 tablet (6.25 mg total) by mouth 2 (two) times daily with a meal.  . cetirizine (ZYRTEC) 10 MG tablet Take 10 mg by mouth daily.  . citalopram (CELEXA) 20 MG tablet Take 20 mg by mouth daily.  . furosemide (LASIX) 20 MG tablet Take 20 mg by mouth daily.  Marland Kitchen levothyroxine (SYNTHROID, LEVOTHROID) 137 MCG tablet TK 1 T PO QD OES  . montelukast (SINGULAIR) 10 MG tablet Take 1 tablet (10 mg total) by mouth daily.  . OxyCODONE HCl, Abuse Deter, 5 MG TABA Take 5 mg by mouth 2 (two) times daily.   . polyethylene glycol (MIRALAX / GLYCOLAX) packet Take 17 g by mouth 2 (two) times daily. (Patient not taking: Reported on 12/07/2017)  . simvastatin (ZOCOR) 40 MG tablet TAKE 1 TABLET BY MOUTH EVERY NIGHT  . traZODone (DESYREL) 50 MG tablet TAKE 1  TABLET BY MOUTH AT BEDTIME  . [DISCONTINUED] amoxicillin (AMOXIL) 875 MG tablet Take 1 tablet (875 mg total) by mouth 2 (two) times daily. (Patient not taking: Reported on 12/07/2017)  . [DISCONTINUED] docusate sodium (COLACE) 100 MG capsule Take 1 capsule (100 mg total) by mouth 2 (two) times daily. (Patient not taking: Reported on 12/07/2017)  . [DISCONTINUED] ferrous sulfate 325 (65 FE) MG tablet Take 1 tablet (325 mg total) by mouth 3 (three) times daily after meals. (Patient not taking: Reported on 12/07/2017)  . [DISCONTINUED] gemfibrozil (LOPID) 600 MG tablet Take 600 mg by mouth 2 (two) times daily before a meal.  . [DISCONTINUED] levothyroxine (LEVOTHROID) 125 MCG tablet Take 125 mcg by mouth daily before breakfast.  . [DISCONTINUED] methocarbamol (ROBAXIN) 500 MG tablet Take 1 tablet (500 mg total) by mouth every 6 (six) hours as needed for muscle spasms. (Patient taking differently: Take 500 mg by mouth every 8 (eight) hours as needed for muscle spasms. )  . [DISCONTINUED] NEOMYCIN-POLYMYXIN-HYDROCORTISONE (CORTISPORIN) 1 % SOLN OTIC solution Place 3 drops into both ears every 6 (six) hours. (Patient not taking: Reported on 12/07/2017)  . [DISCONTINUED] oxyCODONE (OXY IR/ROXICODONE) 5 MG immediate release tablet Take 1-3 tablets (5-15 mg total) by mouth every 4 (four) hours as needed for severe pain.   No facility-administered encounter medications on file as of 12/17/2017.  Surgical History: Past Surgical History:  Procedure Laterality Date  . ABDOMINAL HYSTERECTOMY    . APPENDECTOMY    . BACK SURGERY    . BILATERAL CARPAL TUNNEL RELEASE    . BREAST CYST ASPIRATION Left    neg  . COLONOSCOPY    . JOINT REPLACEMENT    . KNEE SURGERY Bilateral   . KNEE SURGERY    . TOTAL KNEE REVISION Right 08/13/2015   Procedure: TOTAL KNEE REVISION;  Surgeon: Paralee Cancel, MD;  Location: [facility];  Service: Orthopedics;  Laterality: Right;    Medical History: Past Medical History:  Diagnosis  Date  . Allergy   . Anemia   . Cancer ([place])    skin cancer  . Hypertension   . Hypothyroidism   . Sciatica   . Sleep apnea   . Thyroid disease     Family History: Family History  Problem Relation Age of Onset  . Breast cancer Paternal Aunt   . Dementia Mother    Review of Systems  Constitutional: Negative for chills, diaphoresis and fatigue.       Feeling cold all the time   HENT: Negative for ear pain, postnasal drip and sinus pressure.   Eyes: Negative for photophobia, discharge, redness, itching and visual disturbance.  Respiratory: Negative for cough, shortness of breath and wheezing.   Cardiovascular: Negative for chest pain, palpitations and leg swelling.  Gastrointestinal: Negative for abdominal pain, constipation, diarrhea, nausea and vomiting.  Endocrine: Positive for cold intolerance. Negative for heat intolerance.  Genitourinary: Negative for dysuria and flank pain.  Musculoskeletal: Negative for arthralgias, back pain, gait problem and neck pain.  Skin: Negative for color change.  Allergic/Immunologic: Negative for environmental allergies and food allergies.  Neurological: Negative for dizziness and headaches.  Hematological: Does not bruise/bleed easily.  Psychiatric/Behavioral: Negative for agitation, behavioral problems (depression) and hallucinations.   Vital Signs: BP 140/80   Pulse 65   Resp 16   Ht 5' 3.5" (1.613 m)   Wt 168 lb 12.8 oz (76.6 kg)   LMP  (LMP Unknown)   SpO2 98%   BMI 29.43 kg/m   Physical Exam  Constitutional: She is oriented to person, place, and time. She appears well-developed and well-nourished. No distress.  HENT:  Head: Normocephalic and atraumatic.  Mouth/Throat: Oropharynx is clear and moist. No oropharyngeal exudate.  Eyes: Pupils are equal, round, and reactive to light. EOM are normal.  Neck: Normal range of motion. Neck supple. No JVD present. No tracheal deviation present. No thyromegaly present.  Cardiovascular:  Normal rate, regular rhythm and normal heart sounds. Exam reveals no gallop and no friction rub.  No murmur heard. Pulmonary/Chest: Effort normal. No respiratory distress. She has no wheezes. She has no rales. She exhibits no tenderness. Right breast exhibits no inverted nipple. Left breast exhibits no inverted nipple. Breasts are symmetrical.  Abdominal: Soft. Bowel sounds are normal.  Musculoskeletal: Normal range of motion.  Lymphadenopathy:    She has no cervical adenopathy.  Neurological: She is alert and oriented to person, place, and time. No cranial nerve deficit.  Skin: Skin is warm and dry. She is not diaphoretic.  Psychiatric: She has a normal mood and affect. Her behavior is normal. Judgment and thought content normal.   Assessment/Plan: 1. Encounter for general adult medical examination with abnormal findings - Labs and mammogram updated   2. Essential hypertension Continue  - amLODipine (NORVASC) 2.5 MG tablet; TK 1 T PO QD FOR BP; Refill: 0 - Comprehensive metabolic  panel  3. Hypothyroidism, unspecified type Continue - levothyroxine (SYNTHROID, LEVOTHROID) 137 MCG tablet; TK 1 T PO QD OES; Refill: 9 - TSH - T4, free  4. Mixed hyperlipidemia - Lipid Panel With LDL/HDL Ratio  5. Depression, major, single episode, moderate (HCC) - Stable   6. Dysuria - Urinalysis, Routine w reflex microscopic  7. Cold intolerance - CBC with Differential/Platelet - B12 and Folate Panel; Future - Fe+TIBC+Fer; Future - Microscopic Examination  8. Nicotine abuse - Counseling provided   9. Post-nasal drip - Add Azelastine HCl 0.15 % SOLN; 2 sq in each nostril 2 x day  Dispense: 1 mL; Refill: 3  General Counseling: Waylon verbalizes understanding of the findings of todays visit and agrees with plan of treatment. I have discussed any further diagnostic evaluation that may be needed or ordered today. We also reviewed her medications today. she has been encouraged to call the office  with any questions or concerns that should arise related to todays visit. Smoking cessation instruction/counseling given:  Cardiac risk factor modification:  1. Control blood pressure. 2. Exercise as prescribed. 3. Follow low sodium, low fat diet. and low fat and low cholestrol diet. 4. Take ASA 81mg  once a day. Orders Placed This Encounter  Procedures  . Microscopic Examination  . Urinalysis, Routine w reflex microscopic  . CBC with Differential/Platelet  . Lipid Panel With LDL/HDL Ratio  . TSH  . T4, free  . Comprehensive metabolic panel  . B12 and Folate Panel  . Fe+TIBC+Fer    Meds ordered this encounter  Medications  . Azelastine HCl 0.15 % SOLN    Sig: 2 sq in each nostril 2 x day    Dispense:  1 mL    Refill:  3    Time spent:25 Farn[place], MD  Internal Medicine

## 2017-12-18 LAB — URINALYSIS, ROUTINE W REFLEX MICROSCOPIC
Bilirubin, UA: NEGATIVE
Glucose, UA: NEGATIVE
Ketones, UA: NEGATIVE
Nitrite, UA: NEGATIVE
PH UA: 5.5 (ref 5.0–7.5)
PROTEIN UA: NEGATIVE
RBC, UA: NEGATIVE
Specific Gravity, UA: 1.021 (ref 1.005–1.030)
Urobilinogen, Ur: 1 mg/dL (ref 0.2–1.0)

## 2017-12-18 LAB — MICROSCOPIC EXAMINATION
CASTS: NONE SEEN /LPF
Epithelial Cells (non renal): >10 /hpf — AB (ref 0–10)

## 2017-12-21 DIAGNOSIS — E039 Hypothyroidism, unspecified: Secondary | ICD-10-CM | POA: Diagnosis not present

## 2017-12-21 DIAGNOSIS — R6883 Chills (without fever): Secondary | ICD-10-CM | POA: Diagnosis not present

## 2017-12-21 DIAGNOSIS — E782 Mixed hyperlipidemia: Secondary | ICD-10-CM | POA: Diagnosis not present

## 2017-12-21 DIAGNOSIS — Z0001 Encounter for general adult medical examination with abnormal findings: Secondary | ICD-10-CM | POA: Diagnosis not present

## 2017-12-21 DIAGNOSIS — I10 Essential (primary) hypertension: Secondary | ICD-10-CM | POA: Diagnosis not present

## 2017-12-22 LAB — COMPREHENSIVE METABOLIC PANEL
A/G RATIO: 1.7 (ref 1.2–2.2)
ALBUMIN: 4.5 g/dL (ref 3.5–4.8)
ALK PHOS: 88 IU/L (ref 39–117)
ALT: 11 IU/L (ref 0–32)
AST: 16 IU/L (ref 0–40)
BILIRUBIN TOTAL: 0.6 mg/dL (ref 0.0–1.2)
BUN / CREAT RATIO: 19 (ref 12–28)
BUN: 13 mg/dL (ref 8–27)
CHLORIDE: 107 mmol/L — AB (ref 96–106)
CO2: 23 mmol/L (ref 20–29)
Calcium: 10.5 mg/dL — ABNORMAL HIGH (ref 8.7–10.3)
Creatinine, Ser: 0.69 mg/dL (ref 0.57–1.00)
GFR calc Af Amer: 98 mL/min/{1.73_m2} (ref >59)
GFR calc non Af Amer: 85 mL/min/{1.73_m2} (ref >59)
GLUCOSE: 100 mg/dL — AB (ref 65–99)
Globulin, Total: 2.6 g/dL (ref 1.5–4.5)
POTASSIUM: 4.4 mmol/L (ref 3.5–5.2)
SODIUM: 145 mmol/L — AB (ref 134–144)
Total Protein: 7.1 g/dL (ref 6.0–8.5)

## 2017-12-22 LAB — LIPID PANEL WITH LDL/HDL RATIO
Cholesterol, Total: 181 mg/dL (ref 100–199)
HDL: 50 mg/dL (ref >39)
LDL Calculated: 111 mg/dL — ABNORMAL HIGH (ref 0–99)
LDL/HDL RATIO: 2.2 ratio (ref 0.0–3.2)
Triglycerides: 99 mg/dL (ref 0–149)
VLDL Cholesterol Cal: 20 mg/dL (ref 5–40)

## 2017-12-22 LAB — CBC WITH DIFFERENTIAL/PLATELET
BASOS: 1 %
Basophils Absolute: 0 10*3/uL (ref 0.0–0.2)
EOS (ABSOLUTE): 0.3 10*3/uL (ref 0.0–0.4)
EOS: 4 %
HEMATOCRIT: 42.5 % (ref 34.0–46.6)
Hemoglobin: 14.8 g/dL (ref 11.1–15.9)
IMMATURE GRANULOCYTES: 0 %
Immature Grans (Abs): 0 10*3/uL (ref 0.0–0.1)
LYMPHS ABS: 2.5 10*3/uL (ref 0.7–3.1)
Lymphs: 33 %
MCH: 32.4 pg (ref 26.6–33.0)
MCHC: 34.8 g/dL (ref 31.5–35.7)
MCV: 93 fL (ref 79–97)
MONOS ABS: 0.8 10*3/uL (ref 0.1–0.9)
Monocytes: 10 %
Neutrophils Absolute: 3.9 10*3/uL (ref 1.4–7.0)
Neutrophils: 52 %
Platelets: 141 10*3/uL — ABNORMAL LOW (ref 150–379)
RBC: 4.57 x10E6/uL (ref 3.77–5.28)
RDW: 12.8 % (ref 12.3–15.4)
WBC: 7.4 10*3/uL (ref 3.4–10.8)

## 2017-12-22 LAB — T4, FREE: Free T4: 1.38 ng/dL (ref 0.82–1.77)

## 2017-12-22 LAB — TSH: TSH: 0.972 u[IU]/mL (ref 0.450–4.500)

## 2017-12-24 ENCOUNTER — Other Ambulatory Visit: Payer: Self-pay | Admitting: Internal Medicine

## 2017-12-25 ENCOUNTER — Other Ambulatory Visit: Payer: Self-pay | Admitting: Internal Medicine

## 2017-12-25 MED ORDER — GEMFIBROZIL 600 MG PO TABS: 600.0000 mg | ORAL_TABLET | Freq: Two times a day (BID) | ORAL | 2 refills | Status: DC

## 2018-01-20 ENCOUNTER — Other Ambulatory Visit: Payer: Self-pay | Admitting: Internal Medicine

## 2018-01-20 DIAGNOSIS — I10 Essential (primary) hypertension: Secondary | ICD-10-CM

## 2018-01-21 ENCOUNTER — Other Ambulatory Visit: Payer: Self-pay

## 2018-01-21 MED ORDER — MONTELUKAST SODIUM 10 MG PO TABS: 10.0000 mg | ORAL_TABLET | Freq: Every day | ORAL | 1 refills | Status: DC

## 2018-01-26 ENCOUNTER — Encounter (INDEPENDENT_AMBULATORY_CARE_PROVIDER_SITE_OTHER): Payer: Self-pay

## 2018-01-28 DIAGNOSIS — M25561 Pain in right knee: Secondary | ICD-10-CM | POA: Diagnosis not present

## 2018-01-28 DIAGNOSIS — M25562 Pain in left knee: Secondary | ICD-10-CM | POA: Diagnosis not present

## 2018-02-02 ENCOUNTER — Ambulatory Visit (INDEPENDENT_AMBULATORY_CARE_PROVIDER_SITE_OTHER): Payer: PPO | Admitting: Nurse Practitioner

## 2018-02-02 ENCOUNTER — Other Ambulatory Visit: Payer: Self-pay

## 2018-02-02 VITALS — BP 163/66 | HR 52 | Temp 97.6°F | Ht 61.0 in | Wt 167.8 lb

## 2018-02-02 DIAGNOSIS — Z7689 Persons encountering health services in other specified circumstances: Secondary | ICD-10-CM | POA: Diagnosis not present

## 2018-02-02 DIAGNOSIS — I10 Essential (primary) hypertension: Secondary | ICD-10-CM

## 2018-02-02 DIAGNOSIS — F5104 Psychophysiologic insomnia: Secondary | ICD-10-CM

## 2018-02-02 DIAGNOSIS — F419 Anxiety disorder, unspecified: Secondary | ICD-10-CM

## 2018-02-02 MED ORDER — AMLODIPINE BESYLATE 5 MG PO TABS: 5.0000 mg | ORAL_TABLET | Freq: Every day | ORAL | 2 refills | Status: DC

## 2018-02-02 NOTE — Progress Notes (Signed)
Subjective:    Patient ID: [name], female    DOB: 11-Nov-1940, 77 y.o.   MRN: [id number]  [name] is a 77 y.o. female presenting on 02/02/2018 for [place] (Dr. Khan, [place] )   HPI [place] Provider Pt last seen by PCP Dr. Humphrey Rolls at [facility] several months ago.  Obtain records.    Hypertension: Pt reports she usually has high blood pressure.  Recheck 168/70  Anxiety:  Pt takes alprazolam at bedtime and occasionally once another time per day for anxiety.  Pt has used this for many years and currently does not wish to stop using this.  Mammo: 09/2017 Pna vaccine 05/2017 Colonoscopy: 2017 and was "clean"  Past Medical History:  Diagnosis Date  . Allergy   . Anemia   . Cancer ([place])    skin cancer  . Hyperlipidemia   . Hypertension   . Hypothyroidism   . Sciatica   . Sleep apnea   . Thyroid disease    Past Surgical History:  Procedure Laterality Date  . ABDOMINAL HYSTERECTOMY    . APPENDECTOMY    . BACK SURGERY    . BILATERAL CARPAL TUNNEL RELEASE    . BREAST CYST ASPIRATION Left    neg  . COLONOSCOPY    . JOINT REPLACEMENT    . KNEE SURGERY Bilateral   . KNEE SURGERY    . TOTAL KNEE REVISION Right 08/13/2015   Procedure: TOTAL KNEE REVISION;  Surgeon: Paralee Cancel, MD;  Location: [facility];  Service: Orthopedics;  Laterality: Right;   Social History   Socioeconomic History  . Marital status: Married    Spouse name: Not on file  . Number of children: Not on file  . Years of education: Not on file  . Highest education level: Not on file  Occupational History  . Not on file  Social Needs  . Financial resource strain: Not on file  . Food insecurity:    Worry: Not on file    Inability: Not on file  . Transportation needs:    Medical: Not on file    Non-medical: Not on file  Tobacco Use  . Smoking status: Current Every Day Smoker    Packs/day: 0.50    Years: 40.00    Pack years: 20.00  . Smokeless tobacco: Never  Used  Substance and Sexual Activity  . Alcohol use: No  . Drug use: No  . Sexual activity: Not Currently  Lifestyle  . Physical activity:    Days per week: Not on file    Minutes per session: Not on file  . Stress: Not on file  Relationships  . Social connections:    Talks on phone: Not on file    Gets together: Not on file    Attends religious service: Not on file    Active member of club or organization: Not on file    Attends meetings of clubs or organizations: Not on file    Relationship status: Not on file  . Intimate partner violence:    Fear of current or ex partner: Not on file    Emotionally abused: Not on file    Physically abused: Not on file    Forced sexual activity: Not on file  Other Topics Concern  . Not on file  Social History Narrative  . Not on file   Family History  Problem Relation Age of Onset  . Breast cancer Paternal Aunt   . Dementia Mother   .  Dementia Sister   . Heart attack Brother   . Diabetes Brother   . Heart attack Son    Current Outpatient Medications on File Prior to Visit  Medication Sig  . ALPRAZolam (XANAX) 0.5 MG tablet Take 1 tablet (0.5 mg total) by mouth 2 (two) times daily as needed for anxiety.  Marland Kitchen aspirin EC 325 MG EC tablet Take 1 tablet (325 mg total) by mouth 2 (two) times daily. (Patient taking differently: Take 325 mg by mouth daily. )  . Azelastine HCl 0.15 % SOLN 2 sq in each nostril 2 x day  . carvedilol (COREG) 6.25 MG tablet Take 1 tablet (6.25 mg total) by mouth 2 (two) times daily with a meal.  . citalopram (CELEXA) 20 MG tablet Take 20 mg by mouth daily.  . furosemide (LASIX) 20 MG tablet Take 20 mg by mouth daily.  Marland Kitchen gemfibrozil (LOPID) 600 MG tablet Take 1 tablet (600 mg total) by mouth 2 (two) times daily before a meal.  . levothyroxine (SYNTHROID, LEVOTHROID) 125 MCG tablet TK 1 T PO QD ON EMPTY STOMACH  . montelukast (SINGULAIR) 10 MG tablet Take 1 tablet (10 mg total) by mouth daily.  . OxyCODONE HCl, Abuse  Deter, 5 MG TABA Take 5 mg by mouth 2 (two) times daily.   . polyethylene glycol (MIRALAX / GLYCOLAX) packet Take 17 g by mouth 2 (two) times daily.  Marland Kitchen rOPINIRole (REQUIP) 0.5 MG tablet TAKE 1 TABLET BY MOUTH EVERY NIGHT  . simvastatin (ZOCOR) 40 MG tablet TAKE 1 TABLET BY MOUTH EVERY NIGHT  . traZODone (DESYREL) 50 MG tablet TAKE 1 TABLET BY MOUTH AT BEDTIME  . cetirizine (ZYRTEC) 10 MG tablet Take 10 mg by mouth daily.  Marland Kitchen oxyCODONE (OXY IR/ROXICODONE) 5 MG immediate release tablet    No current facility-administered medications on file prior to visit.     Review of Systems  Constitutional: Negative for chills and fever.  HENT: Negative for congestion and sore throat.   Eyes: Negative for pain.  Respiratory: Negative for cough, shortness of breath and wheezing.   Cardiovascular: Negative for chest pain, palpitations and leg swelling.  Gastrointestinal: Negative for abdominal pain, blood in stool, constipation, diarrhea, nausea and vomiting.  Endocrine: Negative for polydipsia.  Genitourinary: Negative for dysuria, frequency, hematuria and urgency.  Musculoskeletal: Negative for back pain, myalgias and neck pain.  Skin: Negative.  Negative for rash.  Allergic/Immunologic: Negative for environmental allergies.  Neurological: Negative for dizziness, weakness and headaches.  Hematological: Does not bruise/bleed easily.  Psychiatric/Behavioral: Positive for sleep disturbance. Negative for dysphoric mood and suicidal ideas. The patient is nervous/anxious.    Per HPI unless specifically indicated above      Objective:    BP (!) 163/66 (BP Location: Right Arm, Patient Position: Sitting, Cuff Size: Normal)   Pulse (!) 52   Temp 97.6 F (36.4 C) (Oral)   Ht 5\' 1"  (1.549 m)   Wt 167 lb 12.8 oz (76.1 kg)   LMP  (LMP Unknown)   BMI 31.71 kg/m   Wt Readings from Last 3 Encounters:  03/16/18 164 lb 12.8 oz (74.8 kg)  02/02/18 167 lb 12.8 oz (76.1 kg)  12/17/17 168 lb 12.8 oz (76.6 kg)      Physical Exam  Constitutional: She is oriented to person, place, and time. She appears well-developed and well-nourished. No distress.  HENT:  Head: Normocephalic and atraumatic.  Neck: Normal range of motion. Neck supple. Carotid bruit is not present.  Cardiovascular: Normal rate, regular  rhythm, S1 normal, S2 normal, normal heart sounds and intact distal pulses.  Pulmonary/Chest: Effort normal and breath sounds normal. No respiratory distress.  Musculoskeletal: She exhibits no edema (pedal).  Neurological: She is alert and oriented to person, place, and time.  Skin: Skin is warm and dry. Capillary refill takes less than 2 seconds.  Psychiatric: She has a normal mood and affect. Her behavior is normal. Judgment and thought content normal.  Vitals reviewed.   Results for orders placed or performed in visit on 12/17/17  Microscopic Examination  Result Value Ref Range   WBC, UA 6-10 (A) 0 - 5 /hpf   RBC, UA 0-2 0 - 2 /hpf   Epithelial Cells (non renal) >10 (A) 0 - 10 /hpf   Casts None seen None seen /lpf   Crystals Present (A) N/A   Crystal Type Calcium Oxalate N/A   Mucus, UA Present Not Estab.   Bacteria, UA Many (A) None seen/Few  Urinalysis, Routine w reflex microscopic  Result Value Ref Range   Specific Gravity, UA 1.021 1.005 - 1.030   pH, UA 5.5 5.0 - 7.5   Color, UA Yellow Yellow   Appearance Ur Cloudy (A) Clear   Leukocytes, UA 1+ (A) Negative   Protein, UA Negative Negative/Trace   Glucose, UA Negative Negative   Ketones, UA Negative Negative   RBC, UA Negative Negative   Bilirubin, UA Negative Negative   Urobilinogen, Ur 1.0 0.2 - 1.0 mg/dL   Nitrite, UA Negative Negative   Microscopic Examination See below:   CBC with Differential/Platelet  Result Value Ref Range   WBC 7.4 3.4 - 10.8 x10E3/uL   RBC 4.57 3.77 - 5.28 x10E6/uL   Hemoglobin 14.8 11.1 - 15.9 g/dL   Hematocrit 42.5 34.0 - 46.6 %   MCV 93 79 - 97 fL   MCH 32.4 26.6 - 33.0 pg   MCHC 34.8 31.5 -  35.7 g/dL   RDW 12.8 12.3 - 15.4 %   Platelets 141 (L) 150 - 379 x10E3/uL   Neutrophils 52 Not Estab. %   Lymphs 33 Not Estab. %   Monocytes 10 Not Estab. %   Eos 4 Not Estab. %   Basos 1 Not Estab. %   Neutrophils Absolute 3.9 1.4 - 7.0 x10E3/uL   Lymphocytes Absolute 2.5 0.7 - 3.1 x10E3/uL   Monocytes Absolute 0.8 0.1 - 0.9 x10E3/uL   EOS (ABSOLUTE) 0.3 0.0 - 0.4 x10E3/uL   Basophils Absolute 0.0 0.0 - 0.2 x10E3/uL   Immature Granulocytes 0 Not Estab. %   Immature Grans (Abs) 0.0 0.0 - 0.1 x10E3/uL  Lipid Panel With LDL/HDL Ratio  Result Value Ref Range   Cholesterol, Total 181 100 - 199 mg/dL   Triglycerides 99 0 - 149 mg/dL   HDL 50 >39 mg/dL   VLDL Cholesterol Cal 20 5 - 40 mg/dL   LDL Calculated 111 (H) 0 - 99 mg/dL   LDl/HDL Ratio 2.2 0.0 - 3.2 ratio  TSH  Result Value Ref Range   TSH 0.972 0.450 - 4.500 uIU/mL  T4, free  Result Value Ref Range   Free T4 1.38 0.82 - 1.77 ng/dL  Comprehensive metabolic panel  Result Value Ref Range   Glucose 100 (H) 65 - 99 mg/dL   BUN 13 8 - 27 mg/dL   Creatinine, Ser 0.69 0.57 - 1.00 mg/dL   GFR calc non Af Amer 85 >59 mL/min/1.73   GFR calc Af Amer 98 >59 mL/min/1.73   BUN/Creatinine Ratio 19 12 -  28   Sodium 145 (H) 134 - 144 mmol/L   Potassium 4.4 3.5 - 5.2 mmol/L   Chloride 107 (H) 96 - 106 mmol/L   CO2 23 20 - 29 mmol/L   Calcium 10.5 (H) 8.7 - 10.3 mg/dL   Total Protein 7.1 6.0 - 8.5 g/dL   Albumin 4.5 3.5 - 4.8 g/dL   Globulin, Total 2.6 1.5 - 4.5 g/dL   Albumin/Globulin Ratio 1.7 1.2 - 2.2   Bilirubin Total 0.6 0.0 - 1.2 mg/dL   Alkaline Phosphatase 88 39 - 117 IU/L   AST 16 0 - 40 IU/L   ALT 11 0 - 32 IU/L      Assessment & Plan:   Problem List Items Addressed This Visit      Cardiovascular and Mediastinum   Hypertension - Primary Uncontrolled hypertension.  BP goal < 130/80.  Pt is not currently working on lifestyle modifications.  Is taking carvedilol without side effects.   Plan: 1. START amlodipine 5  mg once daily 2. Obtain labs   3. Encouraged heart healthy diet and increasing exercise to 30 minutes most days of the week. 4. Check BP 1-2 x per week at home, keep log, and bring to clinic at next appointment. 5. Follow up 6 weeks.      Other Visit Diagnoses    Encounter to establish care     Previous PCP was at [facility] with Dr. Humphrey Rolls.  Records will be requested.  Past medical, family, and surgical history reviewed w/ pt.     Anxiety       Psychophysiological insomnia     Stable in past on alprazolam 0.25 one tab bid prn anxiety or insomnia.  Pt is always taking at bedtime and only occasionally taking through the day.  Followup 6 weeks.  Reviewed controlled substance contract.  Signed.  PMP aware reviewed and is consistent with pt report of use.      Meds ordered this encounter  Medications  . DISCONTD: amLODipine (NORVASC) 5 MG tablet    Sig: Take 1 tablet (5 mg total) by mouth daily.    Dispense:  30 tablet    Refill:  2    Order Specific Question:   Supervising Provider    Answer:   Olin Hauser [[id number]]     Follow up plan: Return in about 6 weeks (around 03/16/2018) for hypertension.  Cassell Smiles, DNP, AGPCNP-BC Adult Gerontology Primary Care Nurse Practitioner [facility] 02/02/2018, 2:33 PM

## 2018-02-02 NOTE — Patient Instructions (Addendum)
[name],   Thank you for coming in to clinic today.  1. Increase amlodipine to 5 mg once daily.  BP goal is less than 140/90 - Continue checking at home about 2-3 times per week.  Call clinic if your blood pressure is higher than 160/95 after 2 weeks from today.  Please schedule a follow-up appointment with Cassell Smiles, AGNP. Return in about 6 weeks (around 03/16/2018) for hypertension.  If you have any other questions or concerns, please feel free to call the clinic or send a message through [place]. You may also schedule an earlier appointment if necessary.  You will receive a survey after today's visit either digitally by e-mail or paper by [employer]. Your experiences and feedback matter to [place].  Please respond so we know how we are doing as we provide care for you.   Cassell Smiles, DNP, AGNP-BC Adult Gerontology Nurse Practitioner [facility], [facility]   Managing Your Hypertension Hypertension is commonly called high blood pressure. This is when the force of your blood pressing against the walls of your arteries is too strong. Arteries are blood vessels that carry blood from your heart throughout your body. Hypertension forces the heart to work harder to pump blood, and may cause the arteries to become narrow or stiff. Having untreated or uncontrolled hypertension can cause heart attack, stroke, kidney disease, and other problems. What are blood pressure readings? A blood pressure reading consists of a higher number over a lower number. Ideally, your blood pressure should be below 120/80. The first ("top") number is called the systolic pressure. It is a measure of the pressure in your arteries as your heart beats. The second ("bottom") number is called the diastolic pressure. It is a measure of the pressure in your arteries as the heart relaxes. What does my blood pressure reading mean? Blood pressure is classified into four stages. Based on your blood pressure  reading, your health care provider may use the following stages to determine what type of treatment you need, if any. Systolic pressure and diastolic pressure are measured in a unit called mm Hg. Normal  Systolic pressure: below 621.  Diastolic pressure: below 80. Elevated  Systolic pressure: 308-657.  Diastolic pressure: below 80. Hypertension stage 1  Systolic pressure: 846-962.  Diastolic pressure: 95-28. Hypertension stage 2  Systolic pressure: 413 or above.  Diastolic pressure: 90 or above. What health risks are associated with hypertension? Managing your hypertension is an important responsibility. Uncontrolled hypertension can lead to:  A heart attack.  A stroke.  A weakened blood vessel (aneurysm).  Heart failure.  Kidney damage.  Eye damage.  Metabolic syndrome.  Memory and concentration problems.  What changes can I make to manage my hypertension? Hypertension can be managed by making lifestyle changes and possibly by taking medicines. Your health care provider will help you make a plan to bring your blood pressure within a normal range. Eating and drinking  Eat a diet that is high in fiber and potassium, and low in salt (sodium), added sugar, and fat. An example eating plan is called the DASH (Dietary Approaches to Stop Hypertension) diet. To eat this way: ? Eat plenty of fresh fruits and vegetables. Try to fill half of your plate at each meal with fruits and vegetables. ? Eat whole grains, such as whole wheat pasta, brown rice, or whole grain bread. Fill about one quarter of your plate with whole grains. ? Eat low-fat diary products. ? Avoid fatty cuts of  meat, processed or cured meats, and poultry with skin. Fill about one quarter of your plate with lean proteins such as fish, chicken without skin, beans, eggs, and tofu. ? Avoid premade and processed foods. These tend to be higher in sodium, added sugar, and fat.  Reduce your daily sodium intake. Most  people with hypertension should eat less than 1,500 mg of sodium a day.  Limit alcohol intake to no more than 1 drink a day for nonpregnant women and 2 drinks a day for men. One drink equals 12 oz of beer, 5 oz of wine, or 1 oz of hard liquor. Lifestyle  Work with your health care provider to maintain a healthy body weight, or to lose weight. Ask what an ideal weight is for you.  Get at least 30 minutes of exercise that causes your heart to beat faster (aerobic exercise) most days of the week. Activities may include walking, swimming, or biking.  Include exercise to strengthen your muscles (resistance exercise), such as weight lifting, as part of your weekly exercise routine. Try to do these types of exercises for 30 minutes at least 3 days a week.  Do not use any products that contain nicotine or tobacco, such as cigarettes and e-cigarettes. If you need help quitting, ask your health care provider.  Control any long-term (chronic) conditions you have, such as high cholesterol or diabetes. Monitoring  Monitor your blood pressure at home as told by your health care provider. Your personal target blood pressure may vary depending on your medical conditions, your age, and other factors.  Have your blood pressure checked regularly, as often as told by your health care provider. Working with your health care provider  Review all the medicines you take with your health care provider because there may be side effects or interactions.  Talk with your health care provider about your diet, exercise habits, and other lifestyle factors that may be contributing to hypertension.  Visit your health care provider regularly. Your health care provider can help you create and adjust your plan for managing hypertension. Will I need medicine to control my blood pressure? Your health care provider may prescribe medicine if lifestyle changes are not enough to get your blood pressure under control, and  if:  Your systolic blood pressure is 130 or higher.  Your diastolic blood pressure is 80 or higher.  Take medicines only as told by your health care provider. Follow the directions carefully. Blood pressure medicines must be taken as prescribed. The medicine does not work as well when you skip doses. Skipping doses also puts you at risk for problems. Contact a health care provider if:  You think you are having a reaction to medicines you have taken.  You have repeated (recurrent) headaches.  You feel dizzy.  You have swelling in your ankles.  You have trouble with your vision. Get help right away if:  You develop a severe headache or confusion.  You have unusual weakness or numbness, or you feel faint.  You have severe pain in your chest or abdomen.  You vomit repeatedly.  You have trouble breathing. Summary  Hypertension is when the force of blood pumping through your arteries is too strong. If this condition is not controlled, it may put you at risk for serious complications.  Your personal target blood pressure may vary depending on your medical conditions, your age, and other factors. For most people, a normal blood pressure is less than 120/80.  Hypertension is managed by lifestyle  changes, medicines, or both. Lifestyle changes include weight loss, eating a healthy, low-sodium diet, exercising more, and limiting alcohol. This information is not intended to replace advice given to you by your health care provider. Make sure you discuss any questions you have with your health care provider. Document Released: 06/02/2012 Document Revised: 08/06/2016 Document Reviewed: 08/06/2016 Elsevier Interactive Patient Education  [employer].

## 2018-02-03 DIAGNOSIS — M25561 Pain in right knee: Secondary | ICD-10-CM | POA: Diagnosis not present

## 2018-02-03 DIAGNOSIS — M25562 Pain in left knee: Secondary | ICD-10-CM | POA: Diagnosis not present

## 2018-02-05 DIAGNOSIS — M25562 Pain in left knee: Secondary | ICD-10-CM | POA: Diagnosis not present

## 2018-02-05 DIAGNOSIS — M25561 Pain in right knee: Secondary | ICD-10-CM | POA: Diagnosis not present

## 2018-02-08 DIAGNOSIS — M25561 Pain in right knee: Secondary | ICD-10-CM | POA: Diagnosis not present

## 2018-02-08 DIAGNOSIS — M25562 Pain in left knee: Secondary | ICD-10-CM | POA: Diagnosis not present

## 2018-02-10 DIAGNOSIS — M25562 Pain in left knee: Secondary | ICD-10-CM | POA: Diagnosis not present

## 2018-02-10 DIAGNOSIS — M25561 Pain in right knee: Secondary | ICD-10-CM | POA: Diagnosis not present

## 2018-02-16 DIAGNOSIS — M25561 Pain in right knee: Secondary | ICD-10-CM | POA: Diagnosis not present

## 2018-02-16 DIAGNOSIS — M25562 Pain in left knee: Secondary | ICD-10-CM | POA: Diagnosis not present

## 2018-02-19 DIAGNOSIS — M25562 Pain in left knee: Secondary | ICD-10-CM | POA: Diagnosis not present

## 2018-02-19 DIAGNOSIS — M25561 Pain in right knee: Secondary | ICD-10-CM | POA: Diagnosis not present

## 2018-02-22 DIAGNOSIS — M25561 Pain in right knee: Secondary | ICD-10-CM | POA: Diagnosis not present

## 2018-02-22 DIAGNOSIS — M25562 Pain in left knee: Secondary | ICD-10-CM | POA: Diagnosis not present

## 2018-02-24 DIAGNOSIS — M25561 Pain in right knee: Secondary | ICD-10-CM | POA: Diagnosis not present

## 2018-02-24 DIAGNOSIS — M25562 Pain in left knee: Secondary | ICD-10-CM | POA: Diagnosis not present

## 2018-03-06 DIAGNOSIS — J449 Chronic obstructive pulmonary disease, unspecified: Secondary | ICD-10-CM | POA: Diagnosis not present

## 2018-03-08 DIAGNOSIS — M47817 Spondylosis without myelopathy or radiculopathy, lumbosacral region: Secondary | ICD-10-CM | POA: Diagnosis not present

## 2018-03-08 DIAGNOSIS — M4807 Spinal stenosis, lumbosacral region: Secondary | ICD-10-CM | POA: Diagnosis not present

## 2018-03-08 DIAGNOSIS — F1120 Opioid dependence, uncomplicated: Secondary | ICD-10-CM | POA: Diagnosis not present

## 2018-03-08 DIAGNOSIS — M25561 Pain in right knee: Secondary | ICD-10-CM | POA: Diagnosis not present

## 2018-03-08 DIAGNOSIS — F411 Generalized anxiety disorder: Secondary | ICD-10-CM | POA: Diagnosis not present

## 2018-03-08 DIAGNOSIS — M549 Dorsalgia, unspecified: Secondary | ICD-10-CM | POA: Diagnosis not present

## 2018-03-08 DIAGNOSIS — M5137 Other intervertebral disc degeneration, lumbosacral region: Secondary | ICD-10-CM | POA: Diagnosis not present

## 2018-03-08 DIAGNOSIS — M25562 Pain in left knee: Secondary | ICD-10-CM | POA: Diagnosis not present

## 2018-03-08 DIAGNOSIS — Z79899 Other long term (current) drug therapy: Secondary | ICD-10-CM | POA: Diagnosis not present

## 2018-03-08 DIAGNOSIS — M545 Low back pain: Secondary | ICD-10-CM | POA: Diagnosis not present

## 2018-03-16 ENCOUNTER — Encounter: Payer: Self-pay | Admitting: Nurse Practitioner

## 2018-03-16 ENCOUNTER — Ambulatory Visit (INDEPENDENT_AMBULATORY_CARE_PROVIDER_SITE_OTHER): Payer: PPO | Admitting: Nurse Practitioner

## 2018-03-16 ENCOUNTER — Other Ambulatory Visit: Payer: Self-pay

## 2018-03-16 VITALS — BP 157/59 | HR 57 | Temp 98.2°F | Ht 61.0 in | Wt 164.8 lb

## 2018-03-16 DIAGNOSIS — I10 Essential (primary) hypertension: Secondary | ICD-10-CM

## 2018-03-16 DIAGNOSIS — M25512 Pain in left shoulder: Secondary | ICD-10-CM | POA: Diagnosis not present

## 2018-03-16 DIAGNOSIS — G8929 Other chronic pain: Secondary | ICD-10-CM

## 2018-03-16 MED ORDER — AMLODIPINE BESYLATE 5 MG PO TABS: 5.0000 mg | ORAL_TABLET | Freq: Every day | ORAL | 2 refills | Status: DC

## 2018-03-16 MED ORDER — LOSARTAN POTASSIUM 50 MG PO TABS: 50.0000 mg | ORAL_TABLET | Freq: Every day | ORAL | 3 refills | Status: DC

## 2018-03-16 NOTE — Patient Instructions (Addendum)
Idalia Needle [place],   Thank you for coming in to clinic today.  1. Referral is placed to Orthopedic Surgery.  2. Consider physical therapy.  Call if you change your mind.  3. For now, start simple range of motion exercises as you can tolerate them.  Start with Exercise A and move to Exercise B if tolerated.  Stop if any cause more pain.  Please schedule a follow-up appointment with Cassell Smiles, AGNP. Return in about 6 weeks (around 04/27/2018) for hypertension.  If you have any other questions or concerns, please feel free to call the clinic or send a message through [place]. You may also schedule an earlier appointment if necessary.  You will receive a survey after today's visit either digitally by e-mail or paper by [employer]. Your experiences and feedback matter to [place].  Please respond so we know how we are doing as we provide care for you.   Cassell Smiles, DNP, AGNP-BC Adult Gerontology Nurse Practitioner [facility], [facility]   Rotator Cuff Injury Rotator cuff injury is any type of injury to the set of muscles and tendons that make up the stabilizing unit of your shoulder. This unit holds the ball of your upper arm bone (humerus) in the socket of your shoulder blade (scapula). What are the causes? Injuries to your rotator cuff most commonly come from sports or activities that cause your arm to be moved repeatedly over your head. Examples of this include throwing, weight lifting, swimming, or racquet sports. Long lasting (chronic) irritation of your rotator cuff can cause soreness and swelling (inflammation), bursitis, and eventual damage to your tendons, such as a tear (rupture). What are the signs or symptoms? Acute rotator cuff tear:  Sudden tearing sensation followed by severe pain shooting from your upper shoulder down your arm toward your elbow.  Decreased range of motion of your shoulder because of pain and muscle spasm.  Severe pain.  Inability to raise your  arm out to the side because of pain and loss of muscle power (large tears).  Chronic rotator cuff tear:  Pain that usually is worse at night and may interfere with sleep.  Gradual weakness and decreased shoulder motion as the pain worsens.  Decreased range of motion.  Rotator cuff tendinitis:  Deep ache in your shoulder and the outside upper arm over your shoulder.  Pain that comes on gradually and becomes worse when lifting your arm to the side or turning it inward.  How is this diagnosed? Rotator cuff injury is diagnosed through a medical history, physical exam, and imaging exam. The medical history helps determine the type of rotator cuff injury. Your health care provider will look at your injured shoulder, feel the injured area, and ask you to move your shoulder in different positions. X-ray exams typically are done to rule out other causes of shoulder pain, such as fractures. MRI is the exam of choice for the most severe shoulder injuries because the images show muscles and tendons. How is this treated? Chronic tear:  Medicine for pain, such as acetaminophen or ibuprofen.  Physical therapy and range-of-motion exercises may be helpful in maintaining shoulder function and strength.  Steroid injections into your shoulder joint.  Surgical repair of the rotator cuff if the injury does not heal with noninvasive treatment.  Acute tear:  Anti-inflammatory medicines such as ibuprofen and naproxen to help reduce pain and swelling.  A sling to help support your arm and rest your rotator cuff muscles. Long-term use of  a sling is not advised. It may cause significant stiffening of the shoulder joint.  Surgery may be considered within a few weeks, especially in younger, active people, to return the shoulder to full function.  Indications for surgical treatment include the following: ? Age younger than 13 years. ? Rotator cuff tears that are complete. ? Physical therapy, rest, and  anti-inflammatory medicines have been used for 6-8 weeks, with no improvement. ? Employment or sporting activity that requires constant shoulder use.  Tendinitis:  Anti-inflammatory medicines such as ibuprofen and naproxen to help reduce pain and swelling.  A sling to help support your arm and rest your rotator cuff muscles. Long-term use of a sling is not advised. It may cause significant stiffening of the shoulder joint.  Severe tendinitis may require: ? Steroid injections into your shoulder joint. ? Physical therapy. ? Surgery.  Follow these instructions at home:  Apply ice to your injury: ? Put ice in a plastic bag. ? Place a towel between your skin and the bag. ? Leave the ice on for 20 minutes, 2-3 times a day.  If you have a shoulder immobilizer (sling and straps), wear it until told otherwise by your health care provider.  You may want to sleep on several pillows or in a recliner at night to lessen swelling and pain.  Only take over-the-counter or prescription medicines for pain, discomfort, or fever as directed by your health care provider.  Do simple hand squeezing exercises with a soft rubber ball to decrease hand swelling. Contact a health care provider if:  Your shoulder pain increases, or new pain or numbness develops in your arm, hand, or fingers.  Your hand or fingers are colder than your other hand. Get help right away if:  Your arm, hand, or fingers are numb or tingling.  Your arm, hand, or fingers are increasingly swollen and painful, or they turn white or blue. This information is not intended to replace advice given to you by your health care provider. Make sure you discuss any questions you have with your health care provider. Document Released: 09/05/2000 Document Revised: 02/14/2016 Document Reviewed: 04/20/2013 Elsevier Interactive Patient Education  2018 [employer].    Shoulder Exercises Ask your health care provider which exercises are safe  for you. Do exercises exactly as told by your health care provider and adjust them as directed. It is normal to feel mild stretching, pulling, tightness, or discomfort as you do these exercises, but you should stop right away if you feel sudden pain or your pain gets worse.Do not begin these exercises until told by your health care provider. RANGE OF MOTION EXERCISES These exercises warm up your muscles and joints and improve the movement and flexibility of your shoulder. These exercises also help to relieve pain, numbness, and tingling. These exercises involve stretching your injured shoulder directly. Exercise A: Pendulum  1. Stand near a wall or a surface that you can hold onto for balance. 2. Bend at the waist and let your left / right arm hang straight down. Use your other arm to support you. Keep your back straight and do not lock your knees. 3. Relax your left / right arm and shoulder muscles, and move your hips and your trunk so your left / right arm swings freely. Your arm should swing because of the motion of your body, not because you are using your arm or shoulder muscles. 4. Keep moving your body so your arm swings in the following directions, as told  by your health care provider: ? Side to side. ? Forward and backward. ? In clockwise and counterclockwise circles. 5. Continue each motion for __________ seconds, or for as long as told by your health care provider. 6. Slowly return to the starting position. Repeat __________ times. Complete this exercise __________ times a day. Exercise B:Flexion, Standing  1. Stand and hold a broomstick, a cane, or a similar object. Place your hands a little more than shoulder-width apart on the object. Your left / right hand should be palm-up, and your other hand should be palm-down. 2. Keep your elbow straight and keep your shoulder muscles relaxed. Push the stick down with your healthy arm to raise your left / right arm in front of your body, and  then over your head until you feel a stretch in your shoulder. ? Avoid shrugging your shoulder while you raise your arm. Keep your shoulder blade tucked down toward the middle of your back. 3. Hold for __________ seconds. 4. Slowly return to the starting position. Repeat __________ times. Complete this exercise __________ times a day. Exercise C: Abduction, Standing 1. Stand and hold a broomstick, a cane, or a similar object. Place your hands a little more than shoulder-width apart on the object. Your left / right hand should be palm-up, and your other hand should be palm-down. 2. While keeping your elbow straight and your shoulder muscles relaxed, push the stick across your body toward your left / right side. Raise your left / right arm to the side of your body and then over your head until you feel a stretch in your shoulder. ? Do not raise your arm above shoulder height, unless your health care provider tells you to do that. ? Avoid shrugging your shoulder while you raise your arm. Keep your shoulder blade tucked down toward the middle of your back. 3. Hold for __________ seconds. 4. Slowly return to the starting position. Repeat __________ times. Complete this exercise __________ times a day. Exercise D:Internal Rotation  1. Place your left / right hand behind your back, palm-up. 2. Use your other hand to dangle an exercise band, a towel, or a similar object over your shoulder. Grasp the band with your left / right hand so you are holding onto both ends. 3. Gently pull up on the band until you feel a stretch in the front of your left / right shoulder. ? Avoid shrugging your shoulder while you raise your arm. Keep your shoulder blade tucked down toward the middle of your back. 4. Hold for __________ seconds. 5. Release the stretch by letting go of the band and lowering your hands. Repeat __________ times. Complete this exercise __________ times a day. STRETCHING EXERCISES These exercises  warm up your muscles and joints and improve the movement and flexibility of your shoulder. These exercises also help to relieve pain, numbness, and tingling. These exercises are done using your healthy shoulder to help stretch the muscles of your injured shoulder. Exercise E: Warehouse manager (External Rotation and Abduction)  1. Stand in a doorway with one of your feet slightly in front of the other. This is called a staggered stance. If you cannot reach your forearms to the door frame, stand facing a corner of a room. 2. Choose one of the following positions as told by your health care provider: ? Place your hands and forearms on the door frame above your head. ? Place your hands and forearms on the door frame at the height of your head. ?  Place your hands on the door frame at the height of your elbows. 3. Slowly move your weight onto your front foot until you feel a stretch across your chest and in the front of your shoulders. Keep your head and chest upright and keep your abdominal muscles tight. 4. Hold for __________ seconds. 5. To release the stretch, shift your weight to your back foot. Repeat __________ times. Complete this stretch __________ times a day. Exercise F:Extension, Standing 1. Stand and hold a broomstick, a cane, or a similar object behind your back. ? Your hands should be a little wider than shoulder-width apart. ? Your palms should face away from your back. 2. Keeping your elbows straight and keeping your shoulder muscles relaxed, move the stick away from your body until you feel a stretch in your shoulder. ? Avoid shrugging your shoulders while you move the stick. Keep your shoulder blade tucked down toward the middle of your back. 3. Hold for __________ seconds. 4. Slowly return to the starting position. Repeat __________ times. Complete this exercise __________ times a day.

## 2018-03-16 NOTE — Assessment & Plan Note (Signed)
Uncontrolled hypertension.  BP goal < 130/80.  Pt is working on lifestyle modifications to eat healthier diet and reduce salt intake.  Taking medications tolerating well without side effects.   Plan: 1. Continue taking amlodipine 5 mg once daily, continue carvedilol 6.25 mg bid.  No indication for MI/CAD to continue beta blocker.  Consider transition off if tolerated in future. - START losartan 50 mg once daily 2. Obtain labs 2 weeks assess kidney function after losartan. 3. Encouraged heart healthy diet and increasing exercise to 30 minutes most days of the week. 4. Check BP 1-2 x per week at home, keep log, and bring to clinic at next appointment. 5. Follow up 6 weeks.

## 2018-03-16 NOTE — Progress Notes (Signed)
Subjective:    Patient ID: [name], female    DOB: March 26, 1941, 77 y.o.   MRN: [id number]  ANGELENE ROME is a 77 y.o. female presenting on 03/16/2018 for Hypertension and Shoulder Injury (pt fell down x 6-8 mths ago. and injured her left shoulder she tripped over a drain pipe in the front yard.  )   HPI Left Shoulder pain Had a fall about 6 months ago or a little longer.  Tripped over drain pipe and landed on hands and knees.  Left shoulder continued to hurt after initial injury.  No popping/trauma noted at the time.  Started aching same day of fall, but no improvement over time. - Would like to go to [place]  Hypertension - She is not checking BP at home or outside of clinic.    - Current medications: amlodipine 5 mg once daily, carvedilol 6.25 mg bid, tolerating well without side effects - She is not currently symptomatic. - Pt denies headache, lightheadedness, dizziness, changes in vision, chest tightness/pressure, palpitations, leg swelling, sudden loss of speech or loss of consciousness. - She  reports no regular exercise routine. - Her diet is moderate in salt, moderate in fat, and moderate in carbohydrates.  - Pt denies any history of CAD, heart racing, palpitations, irregular heart rate.  States Carvedilol is only used for BP in past.  Social History   Tobacco Use  . Smoking status: Current Every Day Smoker    Packs/day: 0.50    Years: 40.00    Pack years: 20.00  . Smokeless tobacco: Never Used  Substance Use Topics  . Alcohol use: No  . Drug use: No    Review of Systems Per HPI unless specifically indicated above     Objective:    BP (!) 157/59 (BP Location: Right Arm, Patient Position: Sitting, Cuff Size: Normal)   Pulse (!) 57   Temp 98.2 F (36.8 C) (Oral)   Ht 5\' 1"  (1.549 m)   Wt 164 lb 12.8 oz (74.8 kg)   LMP  (LMP Unknown)   BMI 31.14 kg/m   Wt Readings from Last 3 Encounters:  03/16/18 164 lb 12.8 oz (74.8 kg)  02/02/18  167 lb 12.8 oz (76.1 kg)  12/17/17 168 lb 12.8 oz (76.6 kg)    Physical Exam  Constitutional: She is oriented to person, place, and time. She appears well-developed and well-nourished. No distress.  HENT:  Head: Normocephalic and atraumatic.  Neck: Normal range of motion. Neck supple. Carotid bruit is not present.  Cardiovascular: Normal rate, regular rhythm, S1 normal, S2 normal, normal heart sounds and intact distal pulses.  Pulmonary/Chest: Effort normal and breath sounds normal. No respiratory distress.  Musculoskeletal: She exhibits no edema (pedal).  LEFT Shoulder Inspection: resting at higher level than right shoulder, otherwise symmetric Palpation: Tender to palpation over lateral shoulder  ROM: Reduced AROM/PROM forward flexion, abduction (85-90 deg), internal / external rotation 2/2 pain at limits of ROM.  No limitations of right shoulder Special Testing: Rotator cuff testing POSITIVE for weakness and pain with supraspinatus full can and empty can test, O'brien's negative for labral pain, Hawkin's AC impingement negative for pain Strength: Reduced strength 4/5 flex/ext, ext rot / int rot, rotator cuff str testing, 5/5 grip. Neurovascular: Distally intact pulses, normal sensation to light touch  Neurological: She is alert and oriented to person, place, and time.  Skin: Skin is warm and dry.  Psychiatric: She has a normal mood and affect. Her behavior is normal.  Vitals reviewed.  Results for orders placed or performed in visit on 12/17/17  Microscopic Examination  Result Value Ref Range   WBC, UA 6-10 (A) 0 - 5 /hpf   RBC, UA 0-2 0 - 2 /hpf   Epithelial Cells (non renal) >10 (A) 0 - 10 /hpf   Casts None seen None seen /lpf   Crystals Present (A) N/A   Crystal Type Calcium Oxalate N/A   Mucus, UA Present Not Estab.   Bacteria, UA Many (A) None seen/Few  Urinalysis, Routine w reflex microscopic  Result Value Ref Range   Specific Gravity, UA 1.021 1.005 - 1.030   pH, UA 5.5  5.0 - 7.5   Color, UA Yellow Yellow   Appearance Ur Cloudy (A) Clear   Leukocytes, UA 1+ (A) Negative   Protein, UA Negative Negative/Trace   Glucose, UA Negative Negative   Ketones, UA Negative Negative   RBC, UA Negative Negative   Bilirubin, UA Negative Negative   Urobilinogen, Ur 1.0 0.2 - 1.0 mg/dL   Nitrite, UA Negative Negative   Microscopic Examination See below:   CBC with Differential/Platelet  Result Value Ref Range   WBC 7.4 3.4 - 10.8 x10E3/uL   RBC 4.57 3.77 - 5.28 x10E6/uL   Hemoglobin 14.8 11.1 - 15.9 g/dL   Hematocrit 42.5 34.0 - 46.6 %   MCV 93 79 - 97 fL   MCH 32.4 26.6 - 33.0 pg   MCHC 34.8 31.5 - 35.7 g/dL   RDW 12.8 12.3 - 15.4 %   Platelets 141 (L) 150 - 379 x10E3/uL   Neutrophils 52 Not Estab. %   Lymphs 33 Not Estab. %   Monocytes 10 Not Estab. %   Eos 4 Not Estab. %   Basos 1 Not Estab. %   Neutrophils Absolute 3.9 1.4 - 7.0 x10E3/uL   Lymphocytes Absolute 2.5 0.7 - 3.1 x10E3/uL   Monocytes Absolute 0.8 0.1 - 0.9 x10E3/uL   EOS (ABSOLUTE) 0.3 0.0 - 0.4 x10E3/uL   Basophils Absolute 0.0 0.0 - 0.2 x10E3/uL   Immature Granulocytes 0 Not Estab. %   Immature Grans (Abs) 0.0 0.0 - 0.1 x10E3/uL  Lipid Panel With LDL/HDL Ratio  Result Value Ref Range   Cholesterol, Total 181 100 - 199 mg/dL   Triglycerides 99 0 - 149 mg/dL   HDL 50 >39 mg/dL   VLDL Cholesterol Cal 20 5 - 40 mg/dL   LDL Calculated 111 (H) 0 - 99 mg/dL   LDl/HDL Ratio 2.2 0.0 - 3.2 ratio  TSH  Result Value Ref Range   TSH 0.972 0.450 - 4.500 uIU/mL  T4, free  Result Value Ref Range   Free T4 1.38 0.82 - 1.77 ng/dL  Comprehensive metabolic panel  Result Value Ref Range   Glucose 100 (H) 65 - 99 mg/dL   BUN 13 8 - 27 mg/dL   Creatinine, Ser 0.69 0.57 - 1.00 mg/dL   GFR calc non Af Amer 85 >59 mL/min/1.73   GFR calc Af Amer 98 >59 mL/min/1.73   BUN/Creatinine Ratio 19 12 - 28   Sodium 145 (H) 134 - 144 mmol/L   Potassium 4.4 3.5 - 5.2 mmol/L   Chloride 107 (H) 96 - 106 mmol/L    CO2 23 20 - 29 mmol/L   Calcium 10.5 (H) 8.7 - 10.3 mg/dL   Total Protein 7.1 6.0 - 8.5 g/dL   Albumin 4.5 3.5 - 4.8 g/dL   Globulin, Total 2.6 1.5 - 4.5 g/dL   Albumin/Globulin Ratio  1.7 1.2 - 2.2   Bilirubin Total 0.6 0.0 - 1.2 mg/dL   Alkaline Phosphatase 88 39 - 117 IU/L   AST 16 0 - 40 IU/L   ALT 11 0 - 32 IU/L      Assessment & Plan:   Problem List Items Addressed This Visit      Cardiovascular and Mediastinum   Hypertension - Primary    Uncontrolled hypertension.  BP goal < 130/80.  Pt is working on lifestyle modifications to eat healthier diet and reduce salt intake.  Taking medications tolerating well without side effects.   Plan: 1. Continue taking amlodipine 5 mg once daily, continue carvedilol 6.25 mg bid.  No indication for MI/CAD to continue beta blocker.  Consider transition off if tolerated in future. - START losartan 50 mg once daily 2. Obtain labs 2 weeks assess kidney function after losartan. 3. Encouraged heart healthy diet and increasing exercise to 30 minutes most days of the week. 4. Check BP 1-2 x per week at home, keep log, and bring to clinic at next appointment. 5. Follow up 6 weeks.        Relevant Medications   losartan (COZAAR) 50 MG tablet   amLODipine (NORVASC) 5 MG tablet    Other Visit Diagnoses    Chronic left shoulder pain       Relevant Orders   Ambulatory referral to Orthopedic Surgery    Consistent with subacute rotator cuff tendinopathy or partial tear with some reduced active ROM and with evidence of muscle tear with weakness after fall. 77 year old patient with possible underlying arthritis.  No bony tenderness noted. Injury > 6 months ago, so unlikely fracture. No imaging on chart  Plan: 1. AVOID NSAID with hypertension, but can consider short term course prn.  Pt currently declines.  Continue opioid per pain management. 2. May take Tylenol Ex Str 1-2 q 6 hr PRN 3. Relative rest but keep shoulder mobile, demonstrated ROM  exercises/provided handout, avoid heavy lifting 4. May try heating pad PRN 5. Follow-up 6 weeks if not improved for re-evaluation.  Recommend Physical Therapy, but pt declines.  Considered X-rays, but will defer to ortho as MRI may be more appropriate given likely tear/rotator cuff injury.  Meds ordered this encounter  Medications  . losartan (COZAAR) 50 MG tablet    Sig: Take 1 tablet (50 mg total) by mouth daily.    Dispense:  90 tablet    Refill:  3    Order Specific Question:   Supervising Provider    Answer:   Olin Hauser [[id number]]  . amLODipine (NORVASC) 5 MG tablet    Sig: Take 1 tablet (5 mg total) by mouth daily.    Dispense:  30 tablet    Refill:  2    Order Specific Question:   Supervising Provider    Answer:   Olin Hauser [[id number]]    Follow up plan: Return in about 6 weeks (around 04/27/2018) for hypertension.  Cassell Smiles, DNP, AGPCNP-BC Adult Gerontology Primary Care Nurse Practitioner [facility] 03/16/2018, 1:52 PM

## 2018-03-17 ENCOUNTER — Encounter: Payer: Self-pay | Admitting: Nurse Practitioner

## 2018-03-21 ENCOUNTER — Encounter: Payer: Self-pay | Admitting: Nurse Practitioner

## 2018-03-22 ENCOUNTER — Other Ambulatory Visit: Payer: Self-pay | Admitting: Internal Medicine

## 2018-03-22 ENCOUNTER — Other Ambulatory Visit: Payer: Self-pay | Admitting: Nurse Practitioner

## 2018-03-22 DIAGNOSIS — G2581 Restless legs syndrome: Secondary | ICD-10-CM

## 2018-04-05 DIAGNOSIS — J449 Chronic obstructive pulmonary disease, unspecified: Secondary | ICD-10-CM | POA: Diagnosis not present

## 2018-04-06 ENCOUNTER — Telehealth: Payer: Self-pay | Admitting: Nurse Practitioner

## 2018-04-06 DIAGNOSIS — G8929 Other chronic pain: Secondary | ICD-10-CM

## 2018-04-06 DIAGNOSIS — M25512 Pain in left shoulder: Principal | ICD-10-CM

## 2018-04-06 NOTE — Telephone Encounter (Signed)
[name] with [facility] received a referral for pt.  When she called to schedule appt, pt told her she wanted to go to [place].

## 2018-04-07 NOTE — Telephone Encounter (Signed)
Changed referral order to [place].  Sent new order.

## 2018-04-09 ENCOUNTER — Other Ambulatory Visit: Payer: Self-pay

## 2018-04-09 MED ORDER — CARVEDILOL 6.25 MG PO TABS: 6.2500 mg | ORAL_TABLET | Freq: Two times a day (BID) | ORAL | 1 refills | Status: DC

## 2018-04-12 ENCOUNTER — Ambulatory Visit: Payer: Self-pay | Admitting: Internal Medicine

## 2018-04-14 ENCOUNTER — Ambulatory Visit: Payer: Self-pay | Admitting: Internal Medicine

## 2018-04-14 DIAGNOSIS — M19012 Primary osteoarthritis, left shoulder: Secondary | ICD-10-CM | POA: Insufficient documentation

## 2018-04-14 DIAGNOSIS — M25512 Pain in left shoulder: Secondary | ICD-10-CM | POA: Diagnosis not present

## 2018-04-14 DIAGNOSIS — M1990 Unspecified osteoarthritis, unspecified site: Secondary | ICD-10-CM | POA: Insufficient documentation

## 2018-04-19 ENCOUNTER — Other Ambulatory Visit: Payer: Self-pay | Admitting: Internal Medicine

## 2018-04-19 DIAGNOSIS — I10 Essential (primary) hypertension: Secondary | ICD-10-CM

## 2018-04-21 ENCOUNTER — Ambulatory Visit: Payer: Self-pay | Admitting: Internal Medicine

## 2018-04-27 ENCOUNTER — Ambulatory Visit: Payer: PPO | Admitting: Nurse Practitioner

## 2018-05-04 ENCOUNTER — Encounter: Payer: Self-pay | Admitting: Nurse Practitioner

## 2018-05-04 ENCOUNTER — Other Ambulatory Visit: Payer: Self-pay

## 2018-05-04 ENCOUNTER — Other Ambulatory Visit: Payer: Self-pay | Admitting: Nurse Practitioner

## 2018-05-04 ENCOUNTER — Ambulatory Visit: Payer: PPO | Admitting: Nurse Practitioner

## 2018-05-04 ENCOUNTER — Ambulatory Visit (INDEPENDENT_AMBULATORY_CARE_PROVIDER_SITE_OTHER): Payer: PPO | Admitting: Nurse Practitioner

## 2018-05-04 VITALS — BP 160/60 | HR 59 | Temp 98.3°F | Ht 61.75 in | Wt 164.8 lb

## 2018-05-04 DIAGNOSIS — F5104 Psychophysiologic insomnia: Secondary | ICD-10-CM

## 2018-05-04 DIAGNOSIS — F419 Anxiety disorder, unspecified: Secondary | ICD-10-CM

## 2018-05-04 DIAGNOSIS — J302 Other seasonal allergic rhinitis: Secondary | ICD-10-CM

## 2018-05-04 DIAGNOSIS — K5901 Slow transit constipation: Secondary | ICD-10-CM

## 2018-05-04 DIAGNOSIS — E039 Hypothyroidism, unspecified: Secondary | ICD-10-CM

## 2018-05-04 DIAGNOSIS — I10 Essential (primary) hypertension: Secondary | ICD-10-CM

## 2018-05-04 DIAGNOSIS — E782 Mixed hyperlipidemia: Secondary | ICD-10-CM

## 2018-05-04 DIAGNOSIS — F324 Major depressive disorder, single episode, in partial remission: Secondary | ICD-10-CM

## 2018-05-04 DIAGNOSIS — G2581 Restless legs syndrome: Secondary | ICD-10-CM

## 2018-05-04 MED ORDER — TRAZODONE HCL 50 MG PO TABS: 50.0000 mg | ORAL_TABLET | Freq: Every day | ORAL | 1 refills | Status: DC

## 2018-05-04 MED ORDER — SIMVASTATIN 40 MG PO TABS: 40.0000 mg | ORAL_TABLET | Freq: Every day | ORAL | 1 refills | Status: DC

## 2018-05-04 MED ORDER — ASPIRIN EC 81 MG PO TBEC: 81.0000 mg | DELAYED_RELEASE_TABLET | Freq: Every day | ORAL | 1 refills | Status: DC

## 2018-05-04 MED ORDER — FUROSEMIDE 20 MG PO TABS: 20.0000 mg | ORAL_TABLET | Freq: Every day | ORAL | 1 refills | Status: DC

## 2018-05-04 MED ORDER — ROPINIROLE HCL 0.5 MG PO TABS: 0.5000 mg | ORAL_TABLET | Freq: Every day | ORAL | 1 refills | Status: DC

## 2018-05-04 MED ORDER — LEVOTHYROXINE SODIUM 125 MCG PO TABS: ORAL_TABLET | ORAL | 1 refills | Status: DC

## 2018-05-04 MED ORDER — CETIRIZINE HCL 10 MG PO TABS: 10.0000 mg | ORAL_TABLET | Freq: Every day | ORAL | 1 refills | Status: DC

## 2018-05-04 MED ORDER — MONTELUKAST SODIUM 10 MG PO TABS: 10.0000 mg | ORAL_TABLET | Freq: Every day | ORAL | 1 refills | Status: DC

## 2018-05-04 MED ORDER — ALPRAZOLAM 0.5 MG PO TABS: 0.5000 mg | ORAL_TABLET | Freq: Every evening | ORAL | 2 refills | Status: DC | PRN

## 2018-05-04 MED ORDER — POLYETHYLENE GLYCOL 3350 17 G PO PACK: 17.0000 g | PACK | Freq: Two times a day (BID) | ORAL | 0 refills | Status: DC

## 2018-05-04 MED ORDER — CITALOPRAM HYDROBROMIDE 20 MG PO TABS: 20.0000 mg | ORAL_TABLET | Freq: Every day | ORAL | 1 refills | Status: DC

## 2018-05-04 MED ORDER — METOPROLOL TARTRATE 25 MG PO TABS: 25.0000 mg | ORAL_TABLET | Freq: Two times a day (BID) | ORAL | 1 refills | Status: DC

## 2018-05-04 MED ORDER — GEMFIBROZIL 600 MG PO TABS: 600.0000 mg | ORAL_TABLET | Freq: Two times a day (BID) | ORAL | 2 refills | Status: DC

## 2018-05-04 NOTE — Patient Instructions (Addendum)
[name],   Thank you for coming in to clinic today.  1. STOP carvedilol tomorrow. - START metoprolol tartrate 25 mg twice daily.  After 2 weeks, come to clinic for BP check.  If your BP is improving and HR is normal.  We will try increasing metoprolol and stopping amlodipine (scratchy throat side effect).  Please schedule a follow-up appointment with Cassell Smiles, AGNP. Return in about 3 months (around 08/04/2018) for hypertension, AND in 2 weeks for BP check with CMA.  If you have any other questions or concerns, please feel free to call the clinic or send a message through [place]. You may also schedule an earlier appointment if necessary.  You will receive a survey after today's visit either digitally by e-mail or paper by [employer]. Your experiences and feedback matter to [place].  Please respond so we know how we are doing as we provide care for you.   Cassell Smiles, DNP, AGNP-BC Adult Gerontology Nurse Practitioner [place]

## 2018-05-04 NOTE — Progress Notes (Signed)
Subjective:    Patient ID: [name], female    DOB: 09/04/1941, 77 y.o.   MRN: [id number]  [name] is a 77 y.o. female presenting on 05/04/2018 for Hypertension   HPI Hypertension - Endorses higher life stress in recent weeks.  Cigarette smoking is cutting back.  Is reading to help with stress and cravings of cigarettes.  - She is not checking BP at home or outside of clinic.    - Current medications: amlodipine 5 mg once daily (new) and has side effect of scratchy throat, losartan 50 mg once daily, and carvedilol 6.25 mg twice daily.  Patient is tolerating well without other side effects - She is not currently symptomatic. - Pt denies headache, lightheadedness, dizziness, changes in vision, chest tightness/pressure, palpitations, leg swelling, sudden loss of speech or loss of consciousness. - She  reports an exercise routine that includes housework, 4-5 days per week. - Her diet is moderate in salt, moderate in fat, and moderate in carbohydrates.   Anxiety Patient takes alprazolam 0.5 mg one tablet at bedtime for anxiety and sleep.  She never takes this any other time of the day and notes that it helps calm her anxiety so she can sleep.  Personally reviewed [place] today, 05/04/18.  According to PMP aware, pt last received a refill on 03/02/2018.  Refill of her controlled substance will be provided today.  Social History   Tobacco Use  . Smoking status: Current Every Day Smoker    Packs/day: 0.50    Years: 40.00    Pack years: 20.00  . Smokeless tobacco: Never Used  Substance Use Topics  . Alcohol use: No  . Drug use: No    Review of Systems Per HPI unless specifically indicated above     Objective:    BP (!) 160/60 (BP Location: Right Arm, Patient Position: Sitting, Cuff Size: Normal)   Pulse (!) 59   Temp 98.3 F (36.8 C) (Oral)   Ht 5' 1.75" (1.568 m)   Wt 164 lb 12.8 oz (74.8 kg)   LMP  (LMP Unknown)   BMI 30.39 kg/m   Wt Readings from Last 3  Encounters:  05/04/18 164 lb 12.8 oz (74.8 kg)  03/16/18 164 lb 12.8 oz (74.8 kg)  02/02/18 167 lb 12.8 oz (76.1 kg)    Physical Exam  Constitutional: She is oriented to person, place, and time. She appears well-developed and well-nourished. No distress.  HENT:  Head: Normocephalic and atraumatic.  Cardiovascular: Normal rate, regular rhythm, S1 normal, S2 normal, normal heart sounds and intact distal pulses.  Pulmonary/Chest: Effort normal and breath sounds normal. No respiratory distress.  Neurological: She is alert and oriented to person, place, and time.  Skin: Skin is warm and dry. Capillary refill takes less than 2 seconds.  Psychiatric: She has a normal mood and affect. Her behavior is normal. Judgment and thought content normal.  Vitals reviewed.   Results for orders placed or performed in visit on 12/17/17  Microscopic Examination  Result Value Ref Range   WBC, UA 6-10 (A) 0 - 5 /hpf   RBC, UA 0-2 0 - 2 /hpf   Epithelial Cells (non renal) >10 (A) 0 - 10 /hpf   Casts None seen None seen /lpf   Crystals Present (A) N/A   Crystal Type Calcium Oxalate N/A   Mucus, UA Present Not Estab.   Bacteria, UA Many (A) None seen/Few  Urinalysis, Routine w reflex microscopic  Result Value Ref Range  Specific Gravity, UA 1.021 1.005 - 1.030   pH, UA 5.5 5.0 - 7.5   Color, UA Yellow Yellow   Appearance Ur Cloudy (A) Clear   Leukocytes, UA 1+ (A) Negative   Protein, UA Negative Negative/Trace   Glucose, UA Negative Negative   Ketones, UA Negative Negative   RBC, UA Negative Negative   Bilirubin, UA Negative Negative   Urobilinogen, Ur 1.0 0.2 - 1.0 mg/dL   Nitrite, UA Negative Negative   Microscopic Examination See below:   CBC with Differential/Platelet  Result Value Ref Range   WBC 7.4 3.4 - 10.8 x10E3/uL   RBC 4.57 3.77 - 5.28 x10E6/uL   Hemoglobin 14.8 11.1 - 15.9 g/dL   Hematocrit 42.5 34.0 - 46.6 %   MCV 93 79 - 97 fL   MCH 32.4 26.6 - 33.0 pg   MCHC 34.8 31.5 - 35.7  g/dL   RDW 12.8 12.3 - 15.4 %   Platelets 141 (L) 150 - 379 x10E3/uL   Neutrophils 52 Not Estab. %   Lymphs 33 Not Estab. %   Monocytes 10 Not Estab. %   Eos 4 Not Estab. %   Basos 1 Not Estab. %   Neutrophils Absolute 3.9 1.4 - 7.0 x10E3/uL   Lymphocytes Absolute 2.5 0.7 - 3.1 x10E3/uL   Monocytes Absolute 0.8 0.1 - 0.9 x10E3/uL   EOS (ABSOLUTE) 0.3 0.0 - 0.4 x10E3/uL   Basophils Absolute 0.0 0.0 - 0.2 x10E3/uL   Immature Granulocytes 0 Not Estab. %   Immature Grans (Abs) 0.0 0.0 - 0.1 x10E3/uL  Lipid Panel With LDL/HDL Ratio  Result Value Ref Range   Cholesterol, Total 181 100 - 199 mg/dL   Triglycerides 99 0 - 149 mg/dL   HDL 50 >39 mg/dL   VLDL Cholesterol Cal 20 5 - 40 mg/dL   LDL Calculated 111 (H) 0 - 99 mg/dL   LDl/HDL Ratio 2.2 0.0 - 3.2 ratio  TSH  Result Value Ref Range   TSH 0.972 0.450 - 4.500 uIU/mL  T4, free  Result Value Ref Range   Free T4 1.38 0.82 - 1.77 ng/dL  Comprehensive metabolic panel  Result Value Ref Range   Glucose 100 (H) 65 - 99 mg/dL   BUN 13 8 - 27 mg/dL   Creatinine, Ser 0.69 0.57 - 1.00 mg/dL   GFR calc non Af Amer 85 >59 mL/min/1.73   GFR calc Af Amer 98 >59 mL/min/1.73   BUN/Creatinine Ratio 19 12 - 28   Sodium 145 (H) 134 - 144 mmol/L   Potassium 4.4 3.5 - 5.2 mmol/L   Chloride 107 (H) 96 - 106 mmol/L   CO2 23 20 - 29 mmol/L   Calcium 10.5 (H) 8.7 - 10.3 mg/dL   Total Protein 7.1 6.0 - 8.5 g/dL   Albumin 4.5 3.5 - 4.8 g/dL   Globulin, Total 2.6 1.5 - 4.5 g/dL   Albumin/Globulin Ratio 1.7 1.2 - 2.2   Bilirubin Total 0.6 0.0 - 1.2 mg/dL   Alkaline Phosphatase 88 39 - 117 IU/L   AST 16 0 - 40 IU/L   ALT 11 0 - 32 IU/L      Assessment & Plan:   Problem List Items Addressed This Visit      Cardiovascular and Mediastinum   Hypertension - Primary    Uncontrolled hypertension.  BP goal < 130/80.  Pt is working on lifestyle modifications to eat healthier diet and reduce salt intake.  Taking medications tolerating well without side  effects.  New side effect patient attributes to amlodipine of scratchy throat.  Plan: 1. Continue taking amlodipine 5 mg once daily - side effect is tolerable per patient.  Will try to reduce and discontinue in future if BP becomes controlled. - STOP carvedilol 6.25 mg bid as HR is 59.  More chronotropic effects with higher doses. - START metoprolol tartrate 25 mg one tab twice daily. - CONTINUE losartan 50 mg once daily - likely is cause of patient's scratchy throat as patient was on amlodipine prior. 2. Obtain BP recheck in clinic 2 weeks. 3. Encouraged heart healthy diet and increasing exercise to 30 minutes most days of the week. 4. Check BP 1-2 x per week at home, keep log, and bring to clinic at next appointment. 5. Follow up 3 months.         Other Visit Diagnoses    Anxiety       Relevant Medications   ALPRAZolam (XANAX) 0.5 MG tablet   Psychophysiological insomnia       Relevant Medications   ALPRAZolam (XANAX) 0.5 MG tablet     # Anxiety and insomnia: Discussed use of benzos for managing this and patient is using minimally only at bedtime.  Prefer transition to alternative medication in future, but patient resistant today.  Plan: 1. Checked PMP aware and prescription is consistent with prior information provided by patient. 2. Continue Xanax 0.5 mg daily at bedtime prn for anxiety/sleep.  # 30 tabs 2 refills. 3. Follow-up 3 months.  Meds ordered this encounter  Medications  . metoprolol tartrate (LOPRESSOR) 25 MG tablet    Sig: Take 1 tablet (25 mg total) by mouth 2 (two) times daily.    Dispense:  60 tablet    Refill:  1    Order Specific Question:   Supervising Provider    Answer:   Olin Hauser [[id number]]  . ALPRAZolam (XANAX) 0.5 MG tablet    Sig: Take 1 tablet (0.5 mg total) by mouth at bedtime as needed for anxiety or sleep.    Dispense:  30 tablet    Refill:  2    Cancel all remaining refills previously provided by Dr. Humphrey Rolls.  I am primary alprazolam  prescriber for future fills.    Order Specific Question:   Supervising Provider    Answer:   Olin Hauser [[id number]]    Follow up plan: Return in about 3 months (around 08/04/2018) for hypertension, AND in 2 weeks for BP check with CMA.  Cassell Smiles, DNP, AGPCNP-BC Adult Gerontology Primary Care Nurse Practitioner [facility] 05/04/2018, 1:50 PM

## 2018-05-06 DIAGNOSIS — J449 Chronic obstructive pulmonary disease, unspecified: Secondary | ICD-10-CM | POA: Diagnosis not present

## 2018-05-10 DIAGNOSIS — M5137 Other intervertebral disc degeneration, lumbosacral region: Secondary | ICD-10-CM | POA: Diagnosis not present

## 2018-05-10 DIAGNOSIS — M25561 Pain in right knee: Secondary | ICD-10-CM | POA: Diagnosis not present

## 2018-05-10 DIAGNOSIS — M4807 Spinal stenosis, lumbosacral region: Secondary | ICD-10-CM | POA: Diagnosis not present

## 2018-05-10 DIAGNOSIS — F411 Generalized anxiety disorder: Secondary | ICD-10-CM | POA: Diagnosis not present

## 2018-05-10 DIAGNOSIS — M549 Dorsalgia, unspecified: Secondary | ICD-10-CM | POA: Diagnosis not present

## 2018-05-10 DIAGNOSIS — F1120 Opioid dependence, uncomplicated: Secondary | ICD-10-CM | POA: Diagnosis not present

## 2018-05-10 DIAGNOSIS — Z79899 Other long term (current) drug therapy: Secondary | ICD-10-CM | POA: Diagnosis not present

## 2018-05-10 DIAGNOSIS — M25562 Pain in left knee: Secondary | ICD-10-CM | POA: Diagnosis not present

## 2018-05-10 DIAGNOSIS — M545 Low back pain: Secondary | ICD-10-CM | POA: Diagnosis not present

## 2018-05-10 DIAGNOSIS — M47817 Spondylosis without myelopathy or radiculopathy, lumbosacral region: Secondary | ICD-10-CM | POA: Diagnosis not present

## 2018-05-18 ENCOUNTER — Ambulatory Visit: Payer: PPO

## 2018-05-18 VITALS — BP 139/59 | HR 49

## 2018-05-18 DIAGNOSIS — I10 Essential (primary) hypertension: Secondary | ICD-10-CM

## 2018-06-03 ENCOUNTER — Other Ambulatory Visit: Payer: Self-pay | Admitting: Nurse Practitioner

## 2018-06-03 DIAGNOSIS — I10 Essential (primary) hypertension: Secondary | ICD-10-CM

## 2018-06-04 MED ORDER — METOPROLOL SUCCINATE ER 50 MG PO TB24: 50.0000 mg | ORAL_TABLET | Freq: Every day | ORAL | 5 refills | Status: DC

## 2018-06-06 DIAGNOSIS — J449 Chronic obstructive pulmonary disease, unspecified: Secondary | ICD-10-CM | POA: Diagnosis not present

## 2018-06-16 DIAGNOSIS — Z85828 Personal history of other malignant neoplasm of skin: Secondary | ICD-10-CM | POA: Diagnosis not present

## 2018-06-16 DIAGNOSIS — Z872 Personal history of diseases of the skin and subcutaneous tissue: Secondary | ICD-10-CM | POA: Diagnosis not present

## 2018-06-16 DIAGNOSIS — Z08 Encounter for follow-up examination after completed treatment for malignant neoplasm: Secondary | ICD-10-CM | POA: Diagnosis not present

## 2018-06-16 DIAGNOSIS — L821 Other seborrheic keratosis: Secondary | ICD-10-CM | POA: Diagnosis not present

## 2018-06-29 ENCOUNTER — Ambulatory Visit (INDEPENDENT_AMBULATORY_CARE_PROVIDER_SITE_OTHER): Payer: PPO

## 2018-06-29 DIAGNOSIS — Z23 Encounter for immunization: Secondary | ICD-10-CM | POA: Diagnosis not present

## 2018-07-06 DIAGNOSIS — J449 Chronic obstructive pulmonary disease, unspecified: Secondary | ICD-10-CM | POA: Diagnosis not present

## 2018-08-04 ENCOUNTER — Encounter: Payer: Self-pay | Admitting: Nurse Practitioner

## 2018-08-04 DIAGNOSIS — F5104 Psychophysiologic insomnia: Secondary | ICD-10-CM | POA: Insufficient documentation

## 2018-08-04 DIAGNOSIS — F419 Anxiety disorder, unspecified: Secondary | ICD-10-CM | POA: Insufficient documentation

## 2018-08-04 NOTE — Assessment & Plan Note (Signed)
Uncontrolled hypertension.  BP goal < 130/80.  Pt is working on lifestyle modifications to eat healthier diet and reduce salt intake.  Taking medications tolerating well without side effects.  New side effect patient attributes to amlodipine of scratchy throat.  Plan: 1. Continue taking amlodipine 5 mg once daily - side effect is tolerable per patient.  Will try to reduce and discontinue in future if BP becomes controlled. - STOP carvedilol 6.25 mg bid as HR is 59.  More chronotropic effects with higher doses. - START metoprolol tartrate 25 mg one tab twice daily. - CONTINUE losartan 50 mg once daily - likely is cause of patient's scratchy throat as patient was on amlodipine prior. 2. Obtain BP recheck in clinic 2 weeks. 3. Encouraged heart healthy diet and increasing exercise to 30 minutes most days of the week. 4. Check BP 1-2 x per week at home, keep log, and bring to clinic at next appointment. 5. Follow up 3 months.

## 2018-08-05 ENCOUNTER — Ambulatory Visit (INDEPENDENT_AMBULATORY_CARE_PROVIDER_SITE_OTHER): Payer: PPO | Admitting: Nurse Practitioner

## 2018-08-05 ENCOUNTER — Other Ambulatory Visit: Payer: Self-pay

## 2018-08-05 ENCOUNTER — Encounter: Payer: Self-pay | Admitting: Nurse Practitioner

## 2018-08-05 VITALS — BP 144/68 | HR 56 | Temp 98.0°F | Ht 61.75 in | Wt 163.8 lb

## 2018-08-05 DIAGNOSIS — I10 Essential (primary) hypertension: Secondary | ICD-10-CM

## 2018-08-05 DIAGNOSIS — Z130 Encounter for screening for diseases of the blood and blood-forming organs and certain disorders involving the immune mechanism: Secondary | ICD-10-CM | POA: Diagnosis not present

## 2018-08-05 DIAGNOSIS — Z23 Encounter for immunization: Secondary | ICD-10-CM | POA: Diagnosis not present

## 2018-08-05 DIAGNOSIS — R6889 Other general symptoms and signs: Secondary | ICD-10-CM | POA: Diagnosis not present

## 2018-08-05 DIAGNOSIS — E039 Hypothyroidism, unspecified: Secondary | ICD-10-CM

## 2018-08-05 MED ORDER — AMLODIPINE BESYLATE 10 MG PO TABS: 10.0000 mg | ORAL_TABLET | Freq: Every day | ORAL | 2 refills | Status: DC

## 2018-08-05 NOTE — Progress Notes (Signed)
Subjective:    Patient ID: [name], female    DOB: 20-Sep-1941, 77 y.o.   MRN: [id number]  [name] is a 77 y.o. female presenting on 08/05/2018 for Hypertension and Chills (pt states she stay cold all the time and cannot get warm )   HPI  Feeling very cold Feels warm only when she is under electric blanket in bed.    Keeps thermostat at 74 deg and feels cold.  Has known thyroid disease,   Hypertension - She is checking BP at home or outside of clinic.  Readings usually around 160 SBP - Current medications: amlodipine 5 mg once daily, losartan 50 mg once daily, Toprol XL 50 mg daily, tolerating well without side effects - She is not currently symptomatic. - Pt denies headache, lightheadedness, dizziness, changes in vision, chest tightness/pressure, palpitations, leg swelling, sudden loss of speech or loss of consciousness. - She  reports no regular exercise routine. - Her diet is moderate in salt, moderate in fat, and moderate in carbohydrates.   Insomnia Takes Xanax and Trazodone with oxycodone at bedtime.  Dangerous combination, but patient without problems to date.  Notes she is able to fall asleep with these medications within 30-60 minutes.   Social History   Tobacco Use  . Smoking status: Current Every Day Smoker    Packs/day: 0.50    Years: 40.00    Pack years: 20.00  . Smokeless tobacco: Never Used  Substance Use Topics  . Alcohol use: No  . Drug use: No    Review of Systems Per HPI unless specifically indicated above     Objective:    BP (!) 173/60 (BP Location: Right Arm, Patient Position: Sitting, Cuff Size: Normal)   Pulse (!) 56   Temp 98 F (36.7 C) (Oral)   Ht 5' 1.75" (1.568 m)   Wt 163 lb 12.8 oz (74.3 kg)   LMP  (LMP Unknown)   BMI 30.20 kg/m   Wt Readings from Last 3 Encounters:  08/05/18 163 lb 12.8 oz (74.3 kg)  05/04/18 164 lb 12.8 oz (74.8 kg)  03/16/18 164 lb 12.8 oz (74.8 kg)    Physical Exam  Constitutional: She is  oriented to person, place, and time. She appears well-developed and well-nourished. No distress.  HENT:  Head: Normocephalic and atraumatic.  Neck: Normal range of motion. Neck supple. No thyromegaly present.  Cardiovascular: Normal rate, regular rhythm, S1 normal, S2 normal, normal heart sounds and intact distal pulses.  Pulmonary/Chest: Effort normal and breath sounds normal. No respiratory distress.  Neurological: She is alert and oriented to person, place, and time.  Skin: Skin is warm and dry. Capillary refill takes less than 2 seconds.  Psychiatric: She has a normal mood and affect. Her behavior is normal. Judgment and thought content normal.  Vitals reviewed.  Results for orders placed or performed in visit on 12/17/17  Microscopic Examination  Result Value Ref Range   WBC, UA 6-10 (A) 0 - 5 /hpf   RBC, UA 0-2 0 - 2 /hpf   Epithelial Cells (non renal) >10 (A) 0 - 10 /hpf   Casts None seen None seen /lpf   Crystals Present (A) N/A   Crystal Type Calcium Oxalate N/A   Mucus, UA Present Not Estab.   Bacteria, UA Many (A) None seen/Few  Urinalysis, Routine w reflex microscopic  Result Value Ref Range   Specific Gravity, UA 1.021 1.005 - 1.030   pH, UA 5.5 5.0 - 7.5  Color, UA Yellow Yellow   Appearance Ur Cloudy (A) Clear   Leukocytes, UA 1+ (A) Negative   Protein, UA Negative Negative/Trace   Glucose, UA Negative Negative   Ketones, UA Negative Negative   RBC, UA Negative Negative   Bilirubin, UA Negative Negative   Urobilinogen, Ur 1.0 0.2 - 1.0 mg/dL   Nitrite, UA Negative Negative   Microscopic Examination See below:   CBC with Differential/Platelet  Result Value Ref Range   WBC 7.4 3.4 - 10.8 x10E3/uL   RBC 4.57 3.77 - 5.28 x10E6/uL   Hemoglobin 14.8 11.1 - 15.9 g/dL   Hematocrit 42.5 34.0 - 46.6 %   MCV 93 79 - 97 fL   MCH 32.4 26.6 - 33.0 pg   MCHC 34.8 31.5 - 35.7 g/dL   RDW 12.8 12.3 - 15.4 %   Platelets 141 (L) 150 - 379 x10E3/uL   Neutrophils 52 Not  Estab. %   Lymphs 33 Not Estab. %   Monocytes 10 Not Estab. %   Eos 4 Not Estab. %   Basos 1 Not Estab. %   Neutrophils Absolute 3.9 1.4 - 7.0 x10E3/uL   Lymphocytes Absolute 2.5 0.7 - 3.1 x10E3/uL   Monocytes Absolute 0.8 0.1 - 0.9 x10E3/uL   EOS (ABSOLUTE) 0.3 0.0 - 0.4 x10E3/uL   Basophils Absolute 0.0 0.0 - 0.2 x10E3/uL   Immature Granulocytes 0 Not Estab. %   Immature Grans (Abs) 0.0 0.0 - 0.1 x10E3/uL  Lipid Panel With LDL/HDL Ratio  Result Value Ref Range   Cholesterol, Total 181 100 - 199 mg/dL   Triglycerides 99 0 - 149 mg/dL   HDL 50 >39 mg/dL   VLDL Cholesterol Cal 20 5 - 40 mg/dL   LDL Calculated 111 (H) 0 - 99 mg/dL   LDl/HDL Ratio 2.2 0.0 - 3.2 ratio  TSH  Result Value Ref Range   TSH 0.972 0.450 - 4.500 uIU/mL  T4, free  Result Value Ref Range   Free T4 1.38 0.82 - 1.77 ng/dL  Comprehensive metabolic panel  Result Value Ref Range   Glucose 100 (H) 65 - 99 mg/dL   BUN 13 8 - 27 mg/dL   Creatinine, Ser 0.69 0.57 - 1.00 mg/dL   GFR calc non Af Amer 85 >59 mL/min/1.73   GFR calc Af Amer 98 >59 mL/min/1.73   BUN/Creatinine Ratio 19 12 - 28   Sodium 145 (H) 134 - 144 mmol/L   Potassium 4.4 3.5 - 5.2 mmol/L   Chloride 107 (H) 96 - 106 mmol/L   CO2 23 20 - 29 mmol/L   Calcium 10.5 (H) 8.7 - 10.3 mg/dL   Total Protein 7.1 6.0 - 8.5 g/dL   Albumin 4.5 3.5 - 4.8 g/dL   Globulin, Total 2.6 1.5 - 4.5 g/dL   Albumin/Globulin Ratio 1.7 1.2 - 2.2   Bilirubin Total 0.6 0.0 - 1.2 mg/dL   Alkaline Phosphatase 88 39 - 117 IU/L   AST 16 0 - 40 IU/L   ALT 11 0 - 32 IU/L      Assessment & Plan:   Problem List Items Addressed This Visit      Cardiovascular and Mediastinum   Hypertension UnControlled hypertension.  BP goal < 130/80.  Pt is working on lifestyle modifications.  Taking medications tolerating well without side effects.   Plan: 1. Continue taking losartan and metoprolol without change - Start amlodipine to 10 mg once daily 2. Obtain labs  3. Encouraged  heart healthy diet and increasing exercise  to 30 minutes most days of the week. 4. Check BP 1-2 x per week at home, keep log, and bring to clinic at next appointment. 5. Follow up 3 months.     Relevant Medications   amLODipine (NORVASC) 10 MG tablet   Other Relevant Orders   COMPLETE METABOLIC PANEL WITH GFR (Completed)     Endocrine   Acquired hypothyroidism Previously stable on labs.  Now, patient with cold intolerance.    Plan: 1. Continue levothyroxine 137 mcg daily 2. Recheck labs 3. Follow-up 3 months.   Relevant Medications   levothyroxine (SYNTHROID, LEVOTHROID) 137 MCG tablet   Other Relevant Orders   TSH (Completed)    Other Visit Diagnoses    Need for pneumococcal vaccine     Patient in need of Prevnar 13 - administer today.     Relevant Orders   Pneumococcal conjugate vaccine 13-valent (Completed)   Cold intolerance     See AP thyroid above. - Screen for anemia with labs.   Relevant Orders   TSH (Completed)   Screening for deficiency anemia       Relevant Orders   CBC with Differential (Completed)   Hypercalcemia     Patient with hypercalcemia ID'd on CMP today.  Follow-up with additional labs as ordered.   Relevant Orders   PTH, Intact and Calcium   Calcium, ionized      Meds ordered this encounter  Medications  . amLODipine (NORVASC) 10 MG tablet    Sig: Take 1 tablet (10 mg total) by mouth daily.    Dispense:  30 tablet    Refill:  2    Order Specific Question:   Supervising Provider    Answer:   Olin Hauser [[id number]]  . levothyroxine (SYNTHROID, LEVOTHROID) 137 MCG tablet    Sig: TK 1 T PO QD ON EMPTY STOMACH    Dispense:  90 tablet    Refill:  1    Order Specific Question:   Supervising Provider    Answer:   Olin Hauser [[id number]]   Follow up plan: Return in about 3 months (around 11/05/2018) for hypertension.  Cassell Smiles, DNP, AGPCNP-BC Adult Gerontology Primary Care Nurse Practitioner [facility] 08/05/2018, 10:19 AM

## 2018-08-05 NOTE — Patient Instructions (Addendum)
[name],   Thank you for coming in to clinic today.  1. Blood pressure medications: - Continue metoprolol 50 mg once daily. - Continue losartan 50 mg once daily - CHANGE amlodipine to 10 mg once daily.  New prescription: take 1 tablet.   Current prescription for amlodipine 5 mg tablets - take 2 once daily.  2. Do not take Xanax and Oxycodone together.  This is a very dangerous combination.  Ideally, we will stop one of the medicines.  For sleep we can consider increasing Trazodone and stopping Xanax in future. - Go ahead and try 1/2 tablet Xanax at bedtime.   Please schedule a follow-up appointment with Cassell Smiles, AGNP. Return in about 3 months (around 11/05/2018) for hypertension.  If you have any other questions or concerns, please feel free to call the clinic or send a message through [place]. You may also schedule an earlier appointment if necessary.  You will receive a survey after today's visit either digitally by e-mail or paper by [employer]. Your experiences and feedback matter to [place].  Please respond so we know how we are doing as we provide care for you.   Cassell Smiles, DNP, AGNP-BC Adult Gerontology Nurse Practitioner [place]

## 2018-08-06 LAB — COMPLETE METABOLIC PANEL WITH GFR
AG Ratio: 1.7 (calc) (ref 1.0–2.5)
ALT: 9 U/L (ref 6–29)
AST: 16 U/L (ref 10–35)
Albumin: 4.6 g/dL (ref 3.6–5.1)
Alkaline phosphatase (APISO): 83 U/L (ref 33–130)
BUN: 14 mg/dL (ref 7–25)
CO2: 27 mmol/L (ref 20–32)
Calcium: 11 mg/dL — ABNORMAL HIGH (ref 8.6–10.4)
Chloride: 106 mmol/L (ref 98–110)
Creat: 0.86 mg/dL (ref 0.60–0.93)
GFR, Est African American: 76 mL/min/{1.73_m2} (ref >=60)
GFR, Est Non African American: 66 mL/min/{1.73_m2} (ref >=60)
Globulin: 2.7 g/dL (calc) (ref 1.9–3.7)
Glucose, Bld: 90 mg/dL (ref 65–99)
Potassium: 4.3 mmol/L (ref 3.5–5.3)
Sodium: 142 mmol/L (ref 135–146)
Total Bilirubin: 0.5 mg/dL (ref 0.2–1.2)
Total Protein: 7.3 g/dL (ref 6.1–8.1)

## 2018-08-06 LAB — CBC WITH DIFFERENTIAL/PLATELET
Basophils Absolute: 91 cells/uL (ref 0–200)
Basophils Relative: 1.3 %
Eosinophils Absolute: 147 cells/uL (ref 15–500)
Eosinophils Relative: 2.1 %
HCT: 44.3 % (ref 35.0–45.0)
Hemoglobin: 15.2 g/dL (ref 11.7–15.5)
Lymphs Abs: 2324 cells/uL (ref 850–3900)
MCH: 32.8 pg (ref 27.0–33.0)
MCHC: 34.3 g/dL (ref 32.0–36.0)
MCV: 95.7 fL (ref 80.0–100.0)
MPV: 14 fL — ABNORMAL HIGH (ref 7.5–12.5)
Monocytes Relative: 9.4 %
Neutro Abs: 3780 cells/uL (ref 1500–7800)
Neutrophils Relative %: 54 %
Platelets: 124 10*3/uL — ABNORMAL LOW (ref 140–400)
RBC: 4.63 10*6/uL (ref 3.80–5.10)
RDW: 12.1 % (ref 11.0–15.0)
Total Lymphocyte: 33.2 %
WBC mixed population: 658 cells/uL (ref 200–950)
WBC: 7 10*3/uL (ref 3.8–10.8)

## 2018-08-06 LAB — TSH: TSH: 5.94 mIU/L — ABNORMAL HIGH (ref 0.40–4.50)

## 2018-08-10 ENCOUNTER — Other Ambulatory Visit: Payer: Self-pay | Admitting: Nurse Practitioner

## 2018-08-10 DIAGNOSIS — F5104 Psychophysiologic insomnia: Secondary | ICD-10-CM

## 2018-08-10 DIAGNOSIS — F419 Anxiety disorder, unspecified: Secondary | ICD-10-CM

## 2018-08-10 MED ORDER — LEVOTHYROXINE SODIUM 137 MCG PO TABS: ORAL_TABLET | ORAL | 1 refills | Status: DC

## 2018-08-12 ENCOUNTER — Encounter: Payer: Self-pay | Admitting: Nurse Practitioner

## 2018-08-16 DIAGNOSIS — F1120 Opioid dependence, uncomplicated: Secondary | ICD-10-CM | POA: Diagnosis not present

## 2018-08-16 DIAGNOSIS — M25562 Pain in left knee: Secondary | ICD-10-CM | POA: Diagnosis not present

## 2018-08-16 DIAGNOSIS — M25561 Pain in right knee: Secondary | ICD-10-CM | POA: Diagnosis not present

## 2018-08-16 DIAGNOSIS — M549 Dorsalgia, unspecified: Secondary | ICD-10-CM | POA: Diagnosis not present

## 2018-08-16 DIAGNOSIS — F411 Generalized anxiety disorder: Secondary | ICD-10-CM | POA: Diagnosis not present

## 2018-08-16 DIAGNOSIS — Z79899 Other long term (current) drug therapy: Secondary | ICD-10-CM | POA: Diagnosis not present

## 2018-08-16 DIAGNOSIS — M545 Low back pain: Secondary | ICD-10-CM | POA: Diagnosis not present

## 2018-08-16 DIAGNOSIS — M5137 Other intervertebral disc degeneration, lumbosacral region: Secondary | ICD-10-CM | POA: Diagnosis not present

## 2018-08-16 DIAGNOSIS — M4807 Spinal stenosis, lumbosacral region: Secondary | ICD-10-CM | POA: Diagnosis not present

## 2018-08-16 DIAGNOSIS — M47817 Spondylosis without myelopathy or radiculopathy, lumbosacral region: Secondary | ICD-10-CM | POA: Diagnosis not present

## 2018-09-10 ENCOUNTER — Other Ambulatory Visit: Payer: Self-pay | Admitting: Nurse Practitioner

## 2018-09-10 DIAGNOSIS — I10 Essential (primary) hypertension: Secondary | ICD-10-CM

## 2018-09-10 MED ORDER — AMLODIPINE BESYLATE 10 MG PO TABS: 10.0000 mg | ORAL_TABLET | Freq: Every day | ORAL | 2 refills | Status: DC

## 2018-09-18 ENCOUNTER — Other Ambulatory Visit: Payer: Self-pay | Admitting: Nurse Practitioner

## 2018-09-18 DIAGNOSIS — G2581 Restless legs syndrome: Secondary | ICD-10-CM

## 2018-10-13 ENCOUNTER — Ambulatory Visit: Payer: PPO | Admitting: Podiatry

## 2018-10-13 ENCOUNTER — Encounter: Payer: Self-pay | Admitting: Podiatry

## 2018-10-13 VITALS — BP 155/76 | HR 71 | Resp 16

## 2018-10-13 DIAGNOSIS — L600 Ingrowing nail: Secondary | ICD-10-CM

## 2018-10-13 MED ORDER — NEOMYCIN-POLYMYXIN-HC 1 % OT SOLN: OTIC | 1 refills | Status: DC

## 2018-10-13 NOTE — Patient Instructions (Signed)

## 2018-10-13 NOTE — Progress Notes (Signed)
Subjective:  Patient ID: [name], female    DOB: 1940-12-24,  MRN: [id number] HPI Chief Complaint  Patient presents with  . Toe Pain    2nd and 4th toe right - requesting toenail removal, tender and red around nail  . New Patient (Initial Visit)    Est pt 2016    78 y.o. female presents with the above complaint.   ROS: Denies fever chills nausea vomiting muscle aches pains calf pain back pain chest pain shortness of breath.  Past Medical History:  Diagnosis Date  . Allergy   . Anemia   . Cancer ([place])    skin cancer  . Hyperlipidemia   . Hypertension   . Hypothyroidism   . Sciatica   . Sleep apnea   . Thyroid disease    Past Surgical History:  Procedure Laterality Date  . ABDOMINAL HYSTERECTOMY    . APPENDECTOMY    . BACK SURGERY    . BILATERAL CARPAL TUNNEL RELEASE    . BREAST CYST ASPIRATION Left    neg  . COLONOSCOPY    . JOINT REPLACEMENT    . KNEE SURGERY Bilateral   . KNEE SURGERY    . 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  Azelastine HCl 0.15 % SOLN, Place into the nose., Disp: , Rfl:  .  carvedilol (COREG) 6.25 MG tablet, Take 6.25 mg by mouth 2 (two) times daily with a meal., Disp: , Rfl:  .  Multiple Vitamins-Minerals (CENTRUM SILVER PO), Take by mouth., Disp: , Rfl:  .  ALPRAZolam (XANAX) 0.25 MG tablet, Take 1 tablet (0.25 mg total) by mouth at bedtime as needed for anxiety or sleep., Disp: 30 tablet, Rfl: 2 .  amLODipine (NORVASC) 10 MG tablet, Take 1 tablet (10 mg total) by mouth daily., Disp: 30 tablet, Rfl: 2 .  citalopram (CELEXA) 20 MG tablet, Take 1 tablet (20 mg total) by mouth daily., Disp: 90 tablet, Rfl: 1 .  furosemide (LASIX) 20 MG tablet, Take 1 tablet (20 mg total) by mouth daily., Disp: 90 tablet, Rfl: 1 .  gemfibrozil (LOPID) 600 MG tablet, Take 1 tablet (600 mg total)  by mouth 2 (two) times daily before a meal., Disp: 180 tablet, Rfl: 2 .  levothyroxine (SYNTHROID, LEVOTHROID) 137 MCG tablet, TK 1 T PO QD ON EMPTY STOMACH, Disp: 90 tablet, Rfl: 1 .  montelukast (SINGULAIR) 10 MG tablet, Take 1 tablet (10 mg total) by mouth daily., Disp: 90 tablet, Rfl: 1 .  NEOMYCIN-POLYMYXIN-HYDROCORTISONE (CORTISPORIN) 1 % SOLN OTIC solution, Apply 1-2 drops to toe BID after soaking, Disp: 10 mL, Rfl: 1 .  OxyCODONE HCl, Abuse Deter, 5 MG TABA, Take 5 mg by mouth once. , Disp: , Rfl:  .  rOPINIRole (REQUIP) 0.5 MG tablet, TAKE 1 TABLET BY MOUTH EVERY NIGHT, Disp: 90 tablet, Rfl: 1 .  simvastatin (ZOCOR) 40 MG tablet, Take 1 tablet (40 mg total) by mouth at bedtime., Disp: 90 tablet, Rfl: 1 .  traZODone (DESYREL) 50 MG tablet, Take 1 tablet (50 mg total) by mouth at bedtime., Disp: 90 tablet, Rfl: 1  Allergies  Allergen Reactions  . Codeine Nausea Only    Tolerates oxycodone  . Morphine Nausea And Vomiting  . Ace Inhibitors Cough  . Adhesive [Tape] Other (See Comments) and Rash    whelps   Review of  Systems Objective:   Vitals:   10/13/18 0902  BP: (!) 155/76  Pulse: 71  Resp: 16    General: Well developed, nourished, in no acute distress, alert and oriented x3   Dermatological: Skin is warm, dry and supple bilateral. Nails x 10 are well maintained; remaining integument appears unremarkable at this time. There are no open sores, no preulcerative lesions, no rash or signs of infection present.  Painful thick dystrophic nails second toe and fourth toe of the right foot.  No signs of infection.  Just chronic paronychia and pain.  Vascular: Dorsalis Pedis artery and Posterior Tibial artery pedal pulses are 2/4 bilateral with immedate capillary fill time. Pedal hair growth present. No varicosities and no lower extremity edema present bilateral.   Neruologic: Grossly intact via light touch bilateral. Vibratory intact via tuning fork bilateral. Protective threshold  with Semmes Wienstein monofilament intact to all pedal sites bilateral. Patellar and Achilles deep tendon reflexes 2+ bilateral. No Babinski or clonus noted bilateral.   Musculoskeletal: No gross boney pedal deformities bilateral. No pain, crepitus, or limitation noted with foot and ankle range of motion bilateral. Muscular strength 5/5 in all groups tested bilateral.  Gait: Unassisted, Nonantalgic.    Radiographs:  None taken  Assessment & Plan:   Assessment: Ingrown nail second and fourth right.  Plan: Chemical matrixectomy's were performed today after local anesthesia was administered.  Tolerated seizure well without complications.  She was provided with both oral and written home-going infection for care and soaking of the toes as well as a prescription for Cortisporin Otic which she will apply twice a day after soaking these toes.     Bretta Fees T. [place], [place]

## 2018-10-20 ENCOUNTER — Telehealth: Payer: Self-pay

## 2018-10-20 ENCOUNTER — Other Ambulatory Visit: Payer: Self-pay | Admitting: Nurse Practitioner

## 2018-10-20 DIAGNOSIS — Z1231 Encounter for screening mammogram for malignant neoplasm of breast: Secondary | ICD-10-CM

## 2018-10-20 NOTE — Telephone Encounter (Signed)
Per patient she does not want oxygen any longer, she requests this to be picked up, [facility] patient sent CMN to be signed and I wrote on order to discontinue and pick up oxygen. [name]

## 2018-10-26 ENCOUNTER — Other Ambulatory Visit: Payer: Self-pay | Admitting: Nurse Practitioner

## 2018-10-26 DIAGNOSIS — J302 Other seasonal allergic rhinitis: Secondary | ICD-10-CM

## 2018-10-27 ENCOUNTER — Encounter: Payer: Self-pay | Admitting: Podiatry

## 2018-10-27 ENCOUNTER — Ambulatory Visit (INDEPENDENT_AMBULATORY_CARE_PROVIDER_SITE_OTHER): Payer: PPO | Admitting: Podiatry

## 2018-10-27 DIAGNOSIS — Z9889 Other specified postprocedural states: Secondary | ICD-10-CM

## 2018-10-27 DIAGNOSIS — L600 Ingrowing nail: Secondary | ICD-10-CM

## 2018-10-27 NOTE — Progress Notes (Signed)
She presents today for follow-up of her nail procedure to the second and fourth digits of the right foot.  She says they do not look too great but they do not hurt anymore.  Objective: Vital signs are stable she is alert and oriented x3.  Pulses are palpable.  She has no erythema cellulitis drainage or odor she has some dry eschar present to the fourth toe and mild scab to the second toe right.  Assessment: Well-healed surgical toes.  Plan: Continue to soak cover during the day leave open at bedtime follow-up with me in 2 weeks

## 2018-10-28 ENCOUNTER — Ambulatory Visit (INDEPENDENT_AMBULATORY_CARE_PROVIDER_SITE_OTHER): Payer: PPO | Admitting: Nurse Practitioner

## 2018-10-28 ENCOUNTER — Other Ambulatory Visit: Payer: Self-pay

## 2018-10-28 ENCOUNTER — Encounter: Payer: Self-pay | Admitting: Nurse Practitioner

## 2018-10-28 DIAGNOSIS — F419 Anxiety disorder, unspecified: Secondary | ICD-10-CM

## 2018-10-28 DIAGNOSIS — E039 Hypothyroidism, unspecified: Secondary | ICD-10-CM

## 2018-10-28 DIAGNOSIS — I10 Essential (primary) hypertension: Secondary | ICD-10-CM

## 2018-10-28 DIAGNOSIS — E782 Mixed hyperlipidemia: Secondary | ICD-10-CM | POA: Diagnosis not present

## 2018-10-28 DIAGNOSIS — F324 Major depressive disorder, single episode, in partial remission: Secondary | ICD-10-CM | POA: Diagnosis not present

## 2018-10-28 DIAGNOSIS — F5104 Psychophysiologic insomnia: Secondary | ICD-10-CM

## 2018-10-28 DIAGNOSIS — G2581 Restless legs syndrome: Secondary | ICD-10-CM

## 2018-10-28 DIAGNOSIS — J302 Other seasonal allergic rhinitis: Secondary | ICD-10-CM

## 2018-10-28 MED ORDER — FUROSEMIDE 20 MG PO TABS: 20.0000 mg | ORAL_TABLET | Freq: Every day | ORAL | 1 refills | Status: DC

## 2018-10-28 MED ORDER — MONTELUKAST SODIUM 10 MG PO TABS: 10.0000 mg | ORAL_TABLET | Freq: Every day | ORAL | 1 refills | Status: DC

## 2018-10-28 MED ORDER — ALPRAZOLAM 0.25 MG PO TABS: 0.2500 mg | ORAL_TABLET | Freq: Every evening | ORAL | 5 refills | Status: DC | PRN

## 2018-10-28 MED ORDER — TRAZODONE HCL 50 MG PO TABS: 50.0000 mg | ORAL_TABLET | Freq: Every day | ORAL | 1 refills | Status: DC

## 2018-10-28 MED ORDER — GEMFIBROZIL 600 MG PO TABS: 600.0000 mg | ORAL_TABLET | Freq: Two times a day (BID) | ORAL | 2 refills | Status: DC

## 2018-10-28 MED ORDER — LEVOTHYROXINE SODIUM 137 MCG PO TABS: ORAL_TABLET | ORAL | 1 refills | Status: DC

## 2018-10-28 MED ORDER — ROPINIROLE HCL 0.5 MG PO TABS: 0.5000 mg | ORAL_TABLET | Freq: Every day | ORAL | 1 refills | Status: DC

## 2018-10-28 MED ORDER — METOPROLOL SUCCINATE ER 50 MG PO TB24: 50.0000 mg | ORAL_TABLET | Freq: Every day | ORAL | 5 refills | Status: DC

## 2018-10-28 MED ORDER — AMLODIPINE BESYLATE 10 MG PO TABS: 10.0000 mg | ORAL_TABLET | Freq: Every day | ORAL | 2 refills | Status: DC

## 2018-10-28 MED ORDER — SIMVASTATIN 40 MG PO TABS: 40.0000 mg | ORAL_TABLET | Freq: Every day | ORAL | 1 refills | Status: DC

## 2018-10-28 MED ORDER — CITALOPRAM HYDROBROMIDE 20 MG PO TABS: 20.0000 mg | ORAL_TABLET | Freq: Every day | ORAL | 1 refills | Status: DC

## 2018-10-28 MED ORDER — LOSARTAN POTASSIUM 50 MG PO TABS: 50.0000 mg | ORAL_TABLET | Freq: Every day | ORAL | 3 refills | Status: DC

## 2018-10-28 NOTE — Progress Notes (Signed)
Subjective:    Patient ID: [name], female    DOB: [birth year], 78 y.o.   MRN: [id number]  [name] is a 78 y.o. female presenting on 10/28/2018 for Hypertension   HPI Hypertension - She is not checking BP at home or outside of clinic.    - Current medications: amlodipine 5 mg once daily, carvedilol 6.25 mg once daily, metoprolol (pharmacy filled despite D/C and patient resumed), losartan 50 mg once daily, tolerating well without side effects - She is not currently symptomatic.  Dizziness occurs only with fast position changes. - Pt denies headache, lightheadedness, dizziness, changes in vision, chest tightness/pressure, palpitations, leg swelling, sudden loss of speech or loss of consciousness.   - She  reports no regular exercise routine. - Her diet is moderate in salt, moderate in fat, and moderate in carbohydrates.  - Patient eats two meals, very few snacks.  Has lost a bit of weight.   Usually Raisin Bran for cereal, cinnamon flavored cereal.  Hypothyroidism  - Patient is taking levothyroxine in am correctly.  Current dose 137 mcg once daily. - She denies, fatigue, excess energy, weight changes, heart racing, heart palpitations, heat and cold intolerance, changes in hair/skin/nails, and lower leg swelling.  - She does not have any compressive symptoms to include difficulty swallowing, globus sensation, or difficulty breathing when lying flat.   Anxiety/Insomnia Caregiver stress due to husband with new lower leg amputation.  Is now having more caregiver strain, housekeeper/nurse responsibilities has her "worn down."  - Patient is driving again. - Feels medicines are working well for anxiety - Patient is taking alprazolam only as needed for sleep and works well.  Tries not to depend on it, so skips this when she does not need it.     Depression screen [facility] 2/9 10/28/2018 12/17/2017 12/07/2017 11/23/2017  Decreased Interest 0 0 0 0  Down, Depressed, Hopeless 0 0 0 0  PHQ  - 2 Score 0 0 0 0  Altered sleeping 0 - - -  Tired, decreased energy 1 - - -  Change in appetite 0 - - -  Feeling bad or failure about yourself  0 - - -  Trouble concentrating 0 - - -  Moving slowly or fidgety/restless 0 - - -  Suicidal thoughts 0 - - -  PHQ-9 Score 1 - - -  Difficult doing work/chores Not difficult at all - - -     Social History   Tobacco Use  . Smoking status: Current Every Day Smoker    Packs/day: 0.50    Years: 40.00    Pack years: 20.00  . Smokeless tobacco: Never Used  Substance Use Topics  . Alcohol use: No  . Drug use: No    Review of Systems Per HPI unless specifically indicated above     Objective:    BP (!) 145/60 (BP Location: Left Arm, Patient Position: Sitting, Cuff Size: Large)   Pulse 62   Temp 98.6 F (37 C) (Oral)   Ht 5' 1.75" (1.568 m)   Wt 162 lb 3.2 oz (73.6 kg)   LMP  (LMP Unknown)   SpO2 100%   BMI 29.91 kg/m   Wt Readings from Last 3 Encounters:  10/28/18 162 lb 3.2 oz (73.6 kg)  08/05/18 163 lb 12.8 oz (74.3 kg)  05/04/18 164 lb 12.8 oz (74.8 kg)    Physical Exam Vitals signs reviewed.  Constitutional:      General: She is awake. She is not  in acute distress.    Appearance: She is well-developed.  HENT:     Head: Normocephalic and atraumatic.  Neck:     Musculoskeletal: Normal range of motion and neck supple.     Vascular: No carotid bruit.  Cardiovascular:     Rate and Rhythm: Normal rate and regular rhythm.     Pulses:          Radial pulses are 2+ on the right side and 2+ on the left side.       Posterior tibial pulses are 1+ on the right side and 1+ on the left side.     Heart sounds: Normal heart sounds, S1 normal and S2 normal.  Pulmonary:     Effort: Pulmonary effort is normal. No respiratory distress.     Breath sounds: Normal breath sounds and air entry.  Abdominal:     General: Bowel sounds are normal. There is no distension.     Palpations: Abdomen is soft.     Tenderness: There is no abdominal  tenderness.     Hernia: No hernia is present.  Skin:    General: Skin is warm and dry.  Neurological:     Mental Status: She is alert and oriented to person, place, and time.  Psychiatric:        Attention and Perception: Attention normal.        Mood and Affect: Mood and affect normal.        Behavior: Behavior normal. Behavior is cooperative.        Thought Content: Thought content normal.        Judgment: Judgment normal.       Results for orders placed or performed in visit on 10/28/18  Lipid panel  Result Value Ref Range   Cholesterol 207 (H) <200 mg/dL   HDL 52 > OR = 50 mg/dL   Triglycerides 124 <150 mg/dL   LDL Cholesterol (Calc) 132 (H) mg/dL (calc)   Total CHOL/HDL Ratio 4.0 <5.0 (calc)   Non-HDL Cholesterol (Calc) 155 (H) <130 mg/dL (calc)  COMPLETE METABOLIC PANEL WITH GFR  Result Value Ref Range   Glucose, Bld 97 65 - 99 mg/dL   BUN 15 7 - 25 mg/dL   Creat 0.73 0.60 - 0.93 mg/dL   GFR, Est Non African American 80 > OR = 60 mL/min/1.26m2   GFR, Est African American 93 > OR = 60 mL/min/1.40m2   BUN/Creatinine Ratio NOT APPLICABLE 6 - 22 (calc)   Sodium 142 135 - 146 mmol/L   Potassium 4.2 3.5 - 5.3 mmol/L   Chloride 107 98 - 110 mmol/L   CO2 25 20 - 32 mmol/L   Calcium 11.3 (H) 8.6 - 10.4 mg/dL   Total Protein 7.4 6.1 - 8.1 g/dL   Albumin 4.6 3.6 - 5.1 g/dL   Globulin 2.8 1.9 - 3.7 g/dL (calc)   AG Ratio 1.6 1.0 - 2.5 (calc)   Total Bilirubin 0.9 0.2 - 1.2 mg/dL   Alkaline phosphatase (APISO) 80 37 - 153 U/L   AST 14 10 - 35 U/L   ALT 7 6 - 29 U/L  CBC with Differential/Platelet  Result Value Ref Range   WBC 6.3 3.8 - 10.8 Thousand/uL   RBC 4.88 3.80 - 5.10 Million/uL   Hemoglobin 15.8 (H) 11.7 - 15.5 g/dL   HCT 46.0 (H) 35.0 - 45.0 %   MCV 94.3 80.0 - 100.0 fL   MCH 32.4 27.0 - 33.0 pg   MCHC 34.3 32.0 -  36.0 g/dL   RDW 12.0 11.0 - 15.0 %   Platelets 137 (L) 140 - 400 Thousand/uL   MPV  7.5 - 12.5 fL   Neutro Abs 2,741 1,500 - 7,800 cells/uL    Lymphs Abs 2,589 850 - 3,900 cells/uL   Absolute Monocytes 668 200 - 950 cells/uL   Eosinophils Absolute 214 15 - 500 cells/uL   Basophils Absolute 88 0 - 200 cells/uL   Neutrophils Relative % 43.5 %   Total Lymphocyte 41.1 %   Monocytes Relative 10.6 %   Eosinophils Relative 3.4 %   Basophils Relative 1.4 %  TSH + free T4  Result Value Ref Range   TSH W/REFLEX TO FT4 2.73 0.40 - 4.50 mIU/L      Assessment & Plan:   Problem List Items Addressed This Visit      Cardiovascular and Mediastinum   Hypertension Uncontrolled hypertension.  BP goal < 130/80.  Pt is not taking medications correctly due to pharmacy mixup.  Patient is working on lifestyle modifications.  Taking medications tolerating well without side effects despite incorrect/duplicate beta blocker.  No complications.  Plan: 1. Continue only the following meds: - Metoprolol succinate 50 mg once daily - Losartan 50 mg once daily - Amlodipine 10 mg once daily (increased dose to aid in BP control)  2. Obtain labs today  3. Encouraged heart healthy diet and increasing exercise to 30 minutes most days of the week. 4. Check BP 1-2 x per week at home, keep log, and bring to clinic at next appointment. 5. Follow up 3 months.     Relevant Medications   losartan (COZAAR) 50 MG tablet   metoprolol succinate (TOPROL-XL) 50 MG 24 hr tablet   amLODipine (NORVASC) 10 MG tablet   furosemide (LASIX) 20 MG tablet   gemfibrozil (LOPID) 600 MG tablet   simvastatin (ZOCOR) 40 MG tablet   Other Relevant Orders   COMPLETE METABOLIC PANEL WITH GFR (Completed)     Endocrine   Acquired hypothyroidism Stable today on exam.  Medications tolerated without side effects.  Continue at current doses.  Refills provided.  Check labs today. Followup 6 months.    Relevant Medications   metoprolol succinate (TOPROL-XL) 50 MG 24 hr tablet   Other Relevant Orders   Lipid panel (Completed)   TSH + free T4 (Completed)     Other   Anxiety    Psychophysiological insomnia Stable today on exam, but increased caregiver stress situationally.  Medications tolerated without side effects.  Continue at current doses.  Refills provided.  reinforced need to keep Xanax use low and at bedtime for sleep primarily.  Continue non-pharm measures to improve stress.. Followup 3 months.    Relevant Medications   ALPRAZolam (XANAX) 0.25 MG tablet   traZODone (DESYREL) 50 MG tablet    Other Visit Diagnoses    Hypercalcemia   Needs labs repeated.  Needs PTH in future.   Relevant Orders   COMPLETE METABOLIC PANEL WITH GFR (Completed)   CBC with Differential/Platelet (Completed)   Major depressive disorder with single episode, in partial remission ([place])      see AP Anxiety above.   Relevant Medications   ALPRAZolam (XANAX) 0.25 MG tablet   citalopram (CELEXA) 20 MG tablet   traZODone (DESYREL) 50 MG tablet   Mixed hyperlipidemia     Previously controlled hyperlipidemia.  Currently taking simvastatin 40 mg onc daily. No new ASCVD events since last visit.    Plan: 1. Continue simvastatin 40 mg once  daily. - Reviewed common side effects of myalgia (reversible off med), less common side effects of cognitive impairment (reversible off med), increased glucose, rhabdomyolysis. 2. Discussed low glycemic diet. - handout provided 3. Discussed increasing exercise to 30 minutes most days of the week. 4. Follow-up with labs in about 3 months.    Relevant Medications   losartan (COZAAR) 50 MG tablet   metoprolol succinate (TOPROL-XL) 50 MG 24 hr tablet   amLODipine (NORVASC) 10 MG tablet   furosemide (LASIX) 20 MG tablet   gemfibrozil (LOPID) 600 MG tablet   simvastatin (ZOCOR) 40 MG tablet   Other Relevant Orders   Lipid panel (Completed)   COMPLETE METABOLIC PANEL WITH GFR (Completed)   Seasonal allergies     Stable today on exam.  Medications tolerated without side effects.  Continue at current doses.  Refills provided.   . Followup 6 months.    Relevant Medications   montelukast (SINGULAIR) 10 MG tablet   Restless leg syndrome     Stable today on exam.  Medications tolerated without side effects.  Continue at current doses.  Refills provided.   . Followup 6 months.   Relevant Medications   rOPINIRole (REQUIP) 0.5 MG tablet      Meds ordered this encounter  Medications  . ALPRAZolam (XANAX) 0.25 MG tablet    Sig: Take 1 tablet (0.25 mg total) by mouth at bedtime as needed for anxiety or sleep.    Dispense:  30 tablet    Refill:  5    Order Specific Question:   Supervising Provider    Answer:   Olin Hauser [[id number]]  . losartan (COZAAR) 50 MG tablet    Sig: Take 1 tablet (50 mg total) by mouth daily.    Dispense:  90 tablet    Refill:  3    Order Specific Question:   Supervising Provider    Answer:   Olin Hauser [[id number]]  . metoprolol succinate (TOPROL-XL) 50 MG 24 hr tablet    Sig: Take 1 tablet (50 mg total) by mouth daily.    Dispense:  30 tablet    Refill:  5    Order Specific Question:   Supervising Provider    Answer:   Olin Hauser [[id number]]  . amLODipine (NORVASC) 10 MG tablet    Sig: Take 1 tablet (10 mg total) by mouth daily.    Dispense:  30 tablet    Refill:  2    Order Specific Question:   Supervising Provider    Answer:   Olin Hauser [[id number]]  . citalopram (CELEXA) 20 MG tablet    Sig: Take 1 tablet (20 mg total) by mouth daily.    Dispense:  90 tablet    Refill:  1    Order Specific Question:   Supervising Provider    Answer:   Olin Hauser [[id number]]  . furosemide (LASIX) 20 MG tablet    Sig: Take 1 tablet (20 mg total) by mouth daily.    Dispense:  90 tablet    Refill:  1    Order Specific Question:   Supervising Provider    Answer:   Olin Hauser [[id number]]  . gemfibrozil (LOPID) 600 MG tablet    Sig: Take 1 tablet (600 mg total) by mouth 2 (two) times daily before a meal.    Dispense:  180 tablet    Refill:  2    Order Specific  Question:   Supervising Provider  Answer:   Olin Hauser [[id number]]  . DISCONTD: levothyroxine (SYNTHROID, LEVOTHROID) 137 MCG tablet    Sig: TK 1 T PO QD ON EMPTY STOMACH    Dispense:  90 tablet    Refill:  1    Order Specific Question:   Supervising Provider    Answer:   Olin Hauser [[id number]]  . montelukast (SINGULAIR) 10 MG tablet    Sig: Take 1 tablet (10 mg total) by mouth daily.    Dispense:  90 tablet    Refill:  1    Order Specific Question:   Supervising Provider    Answer:   Olin Hauser [[id number]]  . rOPINIRole (REQUIP) 0.5 MG tablet    Sig: Take 1 tablet (0.5 mg total) by mouth at bedtime.    Dispense:  90 tablet    Refill:  1    Order Specific Question:   Supervising Provider    Answer:   Olin Hauser [[id number]]  . simvastatin (ZOCOR) 40 MG tablet    Sig: Take 1 tablet (40 mg total) by mouth at bedtime.    Dispense:  90 tablet    Refill:  1    Order Specific Question:   Supervising Provider    Answer:   Olin Hauser [[id number]]  . traZODone (DESYREL) 50 MG tablet    Sig: Take 1 tablet (50 mg total) by mouth at bedtime.    Dispense:  90 tablet    Refill:  1    Order Specific Question:   Supervising Provider    Answer:   Olin Hauser [[id number]]   Follow up plan: Return in about 3 months (around 01/26/2019) for hypertension.  Cassell Smiles, DNP, AGPCNP-BC Adult Gerontology Primary Care Nurse Practitioner [facility] 10/28/2018, 2:30 PM

## 2018-10-28 NOTE — Patient Instructions (Addendum)
Idalia Needle [place],   Thank you for coming in to clinic today.  1. Pay close attention to your medication list.  Return to your last blood pressure medications. - Metoprolol succinate (24 hour tablet) 50 mg once daily - Amlodipine 10 mg once daily - Losartan 50 mg once daily  2. You will be due for [place].  This means you should eat no food or drink after midnight.  Drink only water or coffee without cream/sugar on the morning of your lab visit. - Please go ahead and schedule a "Lab Only" visit in the morning at the clinic for lab draw in the next 7 days. - Your results will be available about 2-3 days after blood draw.  If you have set up a MyChart account, you can can log in to MyChart online to view your results and a brief explanation. Also, we can discuss your results together at your next office visit if you would like.  Please schedule a follow-up appointment with Cassell Smiles, AGNP. Return in about 3 months (around 01/26/2019) for hypertension.  If you have any other questions or concerns, please feel free to call the clinic or send a message through [facility]. You may also schedule an earlier appointment if necessary.  You will receive a survey after today's visit either digitally by e-mail or paper by [employer]. Your experiences and feedback matter to [place].  Please respond so we know how we are doing as we provide care for you.   Cassell Smiles, DNP, AGNP-BC Adult Gerontology Nurse Practitioner [place]

## 2018-10-29 ENCOUNTER — Other Ambulatory Visit: Payer: Self-pay | Admitting: Nurse Practitioner

## 2018-10-29 DIAGNOSIS — E039 Hypothyroidism, unspecified: Secondary | ICD-10-CM

## 2018-10-29 MED ORDER — LEVOTHYROXINE SODIUM 137 MCG PO TABS: ORAL_TABLET | ORAL | 1 refills | Status: DC

## 2018-11-01 DIAGNOSIS — E782 Mixed hyperlipidemia: Secondary | ICD-10-CM | POA: Diagnosis not present

## 2018-11-01 DIAGNOSIS — I10 Essential (primary) hypertension: Secondary | ICD-10-CM | POA: Diagnosis not present

## 2018-11-01 DIAGNOSIS — E039 Hypothyroidism, unspecified: Secondary | ICD-10-CM | POA: Diagnosis not present

## 2018-11-02 LAB — CBC WITH DIFFERENTIAL/PLATELET
Absolute Monocytes: 668 cells/uL (ref 200–950)
Basophils Absolute: 88 cells/uL (ref 0–200)
Basophils Relative: 1.4 %
Eosinophils Absolute: 214 cells/uL (ref 15–500)
Eosinophils Relative: 3.4 %
HCT: 46 % — ABNORMAL HIGH (ref 35.0–45.0)
Hemoglobin: 15.8 g/dL — ABNORMAL HIGH (ref 11.7–15.5)
Lymphs Abs: 2589 cells/uL (ref 850–3900)
MCH: 32.4 pg (ref 27.0–33.0)
MCHC: 34.3 g/dL (ref 32.0–36.0)
MCV: 94.3 fL (ref 80.0–100.0)
Monocytes Relative: 10.6 %
Neutro Abs: 2741 cells/uL (ref 1500–7800)
Neutrophils Relative %: 43.5 %
Platelets: 137 10*3/uL — ABNORMAL LOW (ref 140–400)
RBC: 4.88 10*6/uL (ref 3.80–5.10)
RDW: 12 % (ref 11.0–15.0)
Total Lymphocyte: 41.1 %
WBC: 6.3 10*3/uL (ref 3.8–10.8)

## 2018-11-02 LAB — COMPLETE METABOLIC PANEL WITH GFR
AG Ratio: 1.6 (calc) (ref 1.0–2.5)
ALT: 7 U/L (ref 6–29)
AST: 14 U/L (ref 10–35)
Albumin: 4.6 g/dL (ref 3.6–5.1)
Alkaline phosphatase (APISO): 80 U/L (ref 37–153)
BUN: 15 mg/dL (ref 7–25)
CO2: 25 mmol/L (ref 20–32)
Calcium: 11.3 mg/dL — ABNORMAL HIGH (ref 8.6–10.4)
Chloride: 107 mmol/L (ref 98–110)
Creat: 0.73 mg/dL (ref 0.60–0.93)
GFR, Est African American: 93 mL/min/{1.73_m2} (ref >=60)
GFR, Est Non African American: 80 mL/min/{1.73_m2} (ref >=60)
Globulin: 2.8 g/dL (calc) (ref 1.9–3.7)
Glucose, Bld: 97 mg/dL (ref 65–99)
Potassium: 4.2 mmol/L (ref 3.5–5.3)
Sodium: 142 mmol/L (ref 135–146)
Total Bilirubin: 0.9 mg/dL (ref 0.2–1.2)
Total Protein: 7.4 g/dL (ref 6.1–8.1)

## 2018-11-02 LAB — LIPID PANEL
Cholesterol: 207 mg/dL — ABNORMAL HIGH (ref <200)
HDL: 52 mg/dL (ref >=50)
LDL Cholesterol (Calc): 132 mg/dL (calc) — ABNORMAL HIGH
Non-HDL Cholesterol (Calc): 155 mg/dL (calc) — ABNORMAL HIGH (ref <130)
Total CHOL/HDL Ratio: 4 (calc) (ref <5.0)
Triglycerides: 124 mg/dL (ref <150)

## 2018-11-02 LAB — TSH+FREE T4: TSH W/REFLEX TO FT4: 2.73 mIU/L (ref 0.40–4.50)

## 2018-11-03 ENCOUNTER — Encounter: Payer: Self-pay | Admitting: Nurse Practitioner

## 2018-11-04 ENCOUNTER — Ambulatory Visit
Admission: RE | Admit: 2018-11-04 | Discharge: 2018-11-04 | Disposition: A | Discharge status: Home / Self Care | Payer: PPO | Source: Ambulatory Visit | Attending: Nurse Practitioner | Admitting: Nurse Practitioner

## 2018-11-04 DIAGNOSIS — Z1231 Encounter for screening mammogram for malignant neoplasm of breast: Secondary | ICD-10-CM | POA: Diagnosis not present

## 2018-11-10 ENCOUNTER — Encounter: Payer: Self-pay | Admitting: Podiatry

## 2018-11-10 ENCOUNTER — Ambulatory Visit (INDEPENDENT_AMBULATORY_CARE_PROVIDER_SITE_OTHER): Payer: PPO | Admitting: Podiatry

## 2018-11-10 DIAGNOSIS — Z9889 Other specified postprocedural states: Secondary | ICD-10-CM | POA: Diagnosis not present

## 2018-11-10 DIAGNOSIS — L600 Ingrowing nail: Secondary | ICD-10-CM

## 2018-11-10 NOTE — Progress Notes (Signed)
She presents today for follow-up of her nail check.  States that I wanted to him to take the scabs off because it really starting to irritate me.  Objective: Vital signs are stable alert oriented x3.  There is no erythema edema cellulitis drainage or odor second fourth nails of the right foot appear to be healing very nicely.  There is some reactive hyperkeratotic tissue which I peeled off gently today there is no iatrogenic bleeding.  Assessment: Well-healing surgical foot.  Plan: Follow-up with me as needed.

## 2018-11-11 ENCOUNTER — Ambulatory Visit: Payer: PPO | Admitting: Nurse Practitioner

## 2018-11-19 DIAGNOSIS — D485 Neoplasm of uncertain behavior of skin: Secondary | ICD-10-CM | POA: Diagnosis not present

## 2018-11-19 DIAGNOSIS — D0462 Carcinoma in situ of skin of left upper limb, including shoulder: Secondary | ICD-10-CM | POA: Diagnosis not present

## 2018-12-07 ENCOUNTER — Ambulatory Visit: Payer: PPO

## 2018-12-21 ENCOUNTER — Ambulatory Visit: Payer: PPO

## 2019-01-19 ENCOUNTER — Telehealth: Payer: Self-pay

## 2019-01-19 NOTE — Telephone Encounter (Signed)
Covering inbox for [name], AGPCNP-BC while she is out of office while she is on maternity leave.  If it is a minor eye issue that is just bothering her for past month, then we can address this at an upcoming Virtual visit.  Technically - prefer a [employer] Video visit if she is able to arrange this.  Otherwise, if she cannot do video - then we can handle this over the phone.  I would recommend that she try to re-schedule her visit from Friday 5/8 - she can move it up earlier into next week if she prefer and if we have availability, just so that she does not have to wait longer. I am not worried but do think it should be addressed.  She may start - OTC anti-histamine eye - Allergy Eye Drops (Vizene or other brand make them)  To see if these help before her upcoming apt.  If any significant worsening - severe eye redness, drainage, swelling of eyelid or face, or pain associated with moving her eye, or loss of vision - then she needs to seek medical attention more immediately at Urgent Care or ED, she can contact us for triage question if need.  Nobie Putnam, [facility] 01/19/2019, 1:17 PM

## 2019-01-19 NOTE — Telephone Encounter (Signed)
The pt was notified and we rescheduled her to next Tuesday. She verified understanding.

## 2019-01-19 NOTE — Telephone Encounter (Signed)
I attempted to contact the pt concerning her f/u visit scheduled with Lauren on next Friday, May 8th. I offered the patient a virtual visit and she informed me that she was planning on getting Lauren to look at her Left eye at that appointment. She complains of left eye irritation, redness, and mild drainage. She states it feels like something is in her eye. The pt admits she have allergies and have had issues with runny noses, but declines itchy watery eyes.

## 2019-01-25 ENCOUNTER — Ambulatory Visit: Payer: PPO | Admitting: Family Medicine

## 2019-01-25 ENCOUNTER — Other Ambulatory Visit: Payer: Self-pay

## 2019-01-28 ENCOUNTER — Ambulatory Visit: Payer: PPO | Admitting: Nurse Practitioner

## 2019-02-04 ENCOUNTER — Other Ambulatory Visit: Payer: Self-pay

## 2019-02-04 ENCOUNTER — Ambulatory Visit (INDEPENDENT_AMBULATORY_CARE_PROVIDER_SITE_OTHER): Payer: PPO | Admitting: Family Medicine

## 2019-02-04 ENCOUNTER — Encounter: Payer: Self-pay | Admitting: Family Medicine

## 2019-02-04 VITALS — BP 130/63 | Temp 98.3°F | Ht 61.75 in | Wt 164.6 lb

## 2019-02-04 DIAGNOSIS — I10 Essential (primary) hypertension: Secondary | ICD-10-CM | POA: Diagnosis not present

## 2019-02-04 DIAGNOSIS — H10402 Unspecified chronic conjunctivitis, left eye: Secondary | ICD-10-CM

## 2019-02-04 MED ORDER — POLYMYXIN B-TRIMETHOPRIM 10000-0.1 UNIT/ML-% OP SOLN: 1.0000 [drp] | OPHTHALMIC | 0 refills | Status: DC

## 2019-02-04 NOTE — Progress Notes (Signed)
Subjective:    Patient ID: [name], female    DOB: 03-23-1941, 78 y.o.   MRN: [id number]  [name] is a 78 y.o. female presenting on 02/04/2019 for Hypertension and Eye Pain (x 3 weeks severe eye redness, drainage, swelling of eyelid and pain associated with moving her eye. She states it feels like something in her eye )  PCP is Cassell Smiles, AGPCNP-BC - I am currently covering during her maternity leave.   HPI   CHRONIC HTN: Reports no new concerns. Current Meds - Amlodipine 10mg  daily, Metoprolol XL 20mg  daily, Losartan 50mg  daily   Reports good compliance, took meds today. Tolerating well, w/o complaints. Lifestyle: - Exercise: Walking Denies CP, dyspnea, HA, edema, dizziness / lightheadedness  Left Eye Irritation / Conjunctivitis Reports symptoms started about 2 months ago with irritation and some redness, felt in like something "stuck in it" woke up with it and has not resolved since, tried Vizene eye drops some temporary relief. Wearing glasses, not contacts. - Taking OTC allergic  - Denies any fevers, chills, sweats, cough congestion, loss of vision, eye pain, other eye pain or swelling   Depression screen [facility] 2/9 02/04/2019 10/28/2018 12/17/2017  Decreased Interest 0 0 0  Down, Depressed, Hopeless 0 0 0  PHQ - 2 Score 0 0 0  Altered sleeping 3 0 -  Tired, decreased energy 1 1 -  Change in appetite 0 0 -  Feeling bad or failure about yourself  0 0 -  Trouble concentrating 0 0 -  Moving slowly or fidgety/restless 0 0 -  Suicidal thoughts 0 0 -  PHQ-9 Score 4 1 -  Difficult doing work/chores Not difficult at all Not difficult at all -    Social History   Tobacco Use  . Smoking status: Current Every Day Smoker    Packs/day: 0.50    Years: 40.00    Pack years: 20.00  . Smokeless tobacco: Never Used  Substance Use Topics  . Alcohol use: No  . Drug use: No    Review of Systems Per HPI unless specifically indicated above     Objective:    BP  130/63 (BP Location: Left Arm)   Temp 98.3 F (36.8 C) (Oral)   Ht 5' 1.75" (1.568 m)   Wt 164 lb 9.6 oz (74.7 kg)   LMP  (LMP Unknown)   BMI 30.35 kg/m   Wt Readings from Last 3 Encounters:  02/04/19 164 lb 9.6 oz (74.7 kg)  10/28/18 162 lb 3.2 oz (73.6 kg)  08/05/18 163 lb 12.8 oz (74.3 kg)    Physical Exam Vitals signs and nursing note reviewed.  Constitutional:      General: She is not in acute distress.    Appearance: She is well-developed. She is not diaphoretic.     Comments: Well-appearing, comfortable, cooperative  HENT:     Head: Normocephalic and atraumatic.  Eyes:     General:        Right eye: No discharge.        Left eye: Discharge (clear) present.    Extraocular Movements: Extraocular movements intact.     Pupils: Pupils are equal, round, and reactive to light.     Comments: R Eye conjunctiva normal  L Eye conjunctiva with scleral injection irritation, no palpable cyst or stye or no evidence of foreign body within eye.  Neck:     Musculoskeletal: Normal range of motion and neck supple.     Thyroid: No thyromegaly.  Cardiovascular:     Rate and Rhythm: Normal rate and regular rhythm.     Heart sounds: Normal heart sounds. No murmur.  Pulmonary:     Effort: Pulmonary effort is normal. No respiratory distress.     Breath sounds: Normal breath sounds. No wheezing or rales.  Musculoskeletal: Normal range of motion.  Lymphadenopathy:     Cervical: No cervical adenopathy.  Skin:    General: Skin is warm and dry.     Findings: No erythema or rash.  Neurological:     Mental Status: She is alert and oriented to person, place, and time.  Psychiatric:        Behavior: Behavior normal.     Comments: Well groomed, good eye contact, normal speech and thoughts    Results for orders placed or performed in visit on 10/28/18  Lipid panel  Result Value Ref Range   Cholesterol 207 (H) <200 mg/dL   HDL 52 > OR = 50 mg/dL   Triglycerides 124 <150 mg/dL   LDL  Cholesterol (Calc) 132 (H) mg/dL (calc)   Total CHOL/HDL Ratio 4.0 <5.0 (calc)   Non-HDL Cholesterol (Calc) 155 (H) <130 mg/dL (calc)  COMPLETE METABOLIC PANEL WITH GFR  Result Value Ref Range   Glucose, Bld 97 65 - 99 mg/dL   BUN 15 7 - 25 mg/dL   Creat 0.73 0.60 - 0.93 mg/dL   GFR, Est Non African American 80 > OR = 60 mL/min/1.40m2   GFR, Est African American 93 > OR = 60 mL/min/1.57m2   BUN/Creatinine Ratio NOT APPLICABLE 6 - 22 (calc)   Sodium 142 135 - 146 mmol/L   Potassium 4.2 3.5 - 5.3 mmol/L   Chloride 107 98 - 110 mmol/L   CO2 25 20 - 32 mmol/L   Calcium 11.3 (H) 8.6 - 10.4 mg/dL   Total Protein 7.4 6.1 - 8.1 g/dL   Albumin 4.6 3.6 - 5.1 g/dL   Globulin 2.8 1.9 - 3.7 g/dL (calc)   AG Ratio 1.6 1.0 - 2.5 (calc)   Total Bilirubin 0.9 0.2 - 1.2 mg/dL   Alkaline phosphatase (APISO) 80 37 - 153 U/L   AST 14 10 - 35 U/L   ALT 7 6 - 29 U/L  CBC with Differential/Platelet  Result Value Ref Range   WBC 6.3 3.8 - 10.8 Thousand/uL   RBC 4.88 3.80 - 5.10 Million/uL   Hemoglobin 15.8 (H) 11.7 - 15.5 g/dL   HCT 46.0 (H) 35.0 - 45.0 %   MCV 94.3 80.0 - 100.0 fL   MCH 32.4 27.0 - 33.0 pg   MCHC 34.3 32.0 - 36.0 g/dL   RDW 12.0 11.0 - 15.0 %   Platelets 137 (L) 140 - 400 Thousand/uL   MPV  7.5 - 12.5 fL   Neutro Abs 2,741 1,500 - 7,800 cells/uL   Lymphs Abs 2,589 850 - 3,900 cells/uL   Absolute Monocytes 668 200 - 950 cells/uL   Eosinophils Absolute 214 15 - 500 cells/uL   Basophils Absolute 88 0 - 200 cells/uL   Neutrophils Relative % 43.5 %   Total Lymphocyte 41.1 %   Monocytes Relative 10.6 %   Eosinophils Relative 3.4 %   Basophils Relative 1.4 %  TSH + free T4  Result Value Ref Range   TSH W/REFLEX TO FT4 2.73 0.40 - 4.50 mIU/L      Assessment & Plan:   Problem List Items Addressed This Visit    Hypertension - Primary    Currently Well-controlled  HTN - Home BP readings none  No known complications     Plan:  1. Continue current BP regimen - Amlodipine 10mg   daily, Metoprolol XL 50mg  daily, Losartan 50mg  daily  2. Encourage improved lifestyle - low sodium diet, regular exercise 3. Start monitor BP outside office, bring readings to next visit, if persistently >140/90 or new symptoms notify office sooner       Other Visit Diagnoses    Chronic conjunctivitis of left eye, unspecified chronic conjunctivitis type       Relevant Medications   trimethoprim-polymyxin b (POLYTRIM) ophthalmic solution      Meds ordered this encounter  Medications  . trimethoprim-polymyxin b (POLYTRIM) ophthalmic solution    Sig: Place 1 drop into the left eye every 4 (four) hours. For up to 7-10 days    Dispense:  10 mL    Refill:  0    Subacute vs Chronic now L eye conjunctivitis for past 2 months, with mild to moderate scleral/conjunctival injection with clear discharge tears only. Suspected allergic vs bacterial etiology given duration likely - Normal visual acuity in office - No evidence of complication, foreign body, or extending eyelid / pre-septal cellulitis - inadequate conservative treatment at home  Plan: 1. Start Polytrim antibiotic eye drops 1 drop in L eye every 4 hours for 7-10 days 2. Start moist warm compresses over Left eyelid 10-15 min at a time, up to 4-6 times daily until resolution 3. Frequent hand washing, avoid rubbing / scratching eye 4. Strict return precautions for spreading infection 5. Follow-up 1-2 weeks as needed  Previously goes to [facility] - if not improving she can return to eye doctor.  Follow up plan: Return in about 9 months (around 11/07/2019) for Annual Physical.   Nobie Putnam, DO [facility] 02/04/2019, 1:29 PM

## 2019-02-04 NOTE — Assessment & Plan Note (Addendum)
Currently Well-controlled HTN - Home BP readings none  No known complications     Plan:  1. Continue current BP regimen - Amlodipine 10mg  daily, Metoprolol XL 50mg  daily, Losartan 50mg  daily  2. Encourage improved lifestyle - low sodium diet, regular exercise 3. Start monitor BP outside office, bring readings to next visit, if persistently >140/90 or new symptoms notify office sooner

## 2019-02-04 NOTE — Patient Instructions (Addendum)
Thank you for coming to the office today.  BP is good, keep up current regimen.  Left eye likely conjunctivitis - Use antibiotic eye drops 1 drop every 4-6 hours for up to 1-2  Bacterial Conjunctivitis, Adult Bacterial conjunctivitis is an infection of your conjunctiva. This is the clear membrane that covers the white part of your eye and the inner part of your eyelid. This infection can make your eye:  Red or pink.  Itchy. This condition spreads easily from person to person (is contagious) and from one eye to the other eye. What are the causes?  This condition is caused by germs (bacteria). You may get the infection if you come into close contact with: ? A person who has the infection. ? Items that have germs on them (are contaminated), such as face towels, contact lens solution, or eye makeup. What increases the risk? You are more likely to get this condition if you:  Have contact with people who have the infection.  Wear contact lenses.  Have a sinus infection.  Have had a recent eye injury or surgery.  Have a weak body defense system (immune system).  Have dry eyes. What are the signs or symptoms?   Thick, yellowish discharge from the eye.  Tearing or watery eyes.  Itchy eyes.  Burning feeling in your eyes.  Eye redness.  Swollen eyelids.  Blurred vision. How is this treated?   Antibiotic eye drops or ointment.  Antibiotic medicine taken by mouth. This is used for infections that do not get better with drops or ointment or that last more than 10 days.  Cool, wet cloths placed on the eyes.  Artificial tears used 2-6 times a day. Follow these instructions at home: Medicines  Take or apply your antibiotic medicine as told by your doctor. Do not stop taking or applying the antibiotic even if you start to feel better.  Take or apply over-the-counter and prescription medicines only as told by your doctor.  Do not touch your eyelid with the eye-drop  bottle or the ointment tube. Managing discomfort  Wipe any fluid from your eye with a warm, wet washcloth or a cotton ball.  Place a clean, cool, wet cloth on your eye. Do this for 10-20 minutes, 3-4 times per day. General instructions  Do not wear contacts until the infection is gone. Wear glasses until your doctor says it is okay to wear contacts again.  Do not wear eye makeup until the infection is gone. Throw away old eye makeup.  Change or wash your pillowcase every day.  Do not share towels or washcloths.  Wash your hands often with soap and water. Use paper towels to dry your hands.  Do not touch or rub your eyes.  Do not drive or use heavy machinery if your vision is blurred. Contact a doctor if:  You have a fever.  You do not get better after 10 days. Get help right away if:  You have a fever and your symptoms get worse all of a sudden.  You have very bad pain when you move your eye.  Your face: ? Hurts. ? Is red. ? Is swollen.  You have sudden loss of vision. Summary  Bacterial conjunctivitis is an infection of your conjunctiva.  This infection spreads easily from person to person.  Wash your hands often with soap and water. Use paper towels to dry your hands.  Take or apply your antibiotic medicine as told by your doctor.  Contact a  doctor if you have a fever or you do not get better after 10 days. This information is not intended to replace advice given to you by your health care provider. Make sure you discuss any questions you have with your health care provider. Document Released: 06/17/2008 Document Revised: 04/14/2018 Document Reviewed: 04/14/2018 Elsevier Interactive Patient Education  2019 [employer].   Please schedule a Follow-up Appointment to: Return in about 9 months (around 11/07/2019) for Annual Physical.  If you have any other questions or concerns, please feel free to call the office or send a message through [place]. You may also  schedule an earlier appointment if necessary.  Additionally, you may be receiving a survey about your experience at our office within a few days to 1 week by e-mail or mail. We value your feedback.  Nobie Putnam, DO [place]

## 2019-02-26 ENCOUNTER — Other Ambulatory Visit: Payer: Self-pay | Admitting: Nurse Practitioner

## 2019-02-26 DIAGNOSIS — I10 Essential (primary) hypertension: Secondary | ICD-10-CM

## 2019-03-15 ENCOUNTER — Ambulatory Visit (INDEPENDENT_AMBULATORY_CARE_PROVIDER_SITE_OTHER): Payer: PPO

## 2019-03-15 DIAGNOSIS — Z0000 Encounter for general adult medical examination without abnormal findings: Secondary | ICD-10-CM | POA: Diagnosis not present

## 2019-03-15 NOTE — Patient Instructions (Signed)
[name] , Thank you for taking time to come for your Medicare Wellness Visit. I appreciate your ongoing commitment to your health goals. Please review the following plan we discussed and let me know if I can assist you in the future.   Screening recommendations/referrals: Colonoscopy: no longer required Mammogram: completed 10/2018 Bone Density: will check to see if you can do this with your simulator  Recommended yearly ophthalmology/optometry visit for glaucoma screening and checkup Recommended yearly dental visit for hygiene and checkup  Vaccinations: Influenza vaccine: up to date Pneumococcal vaccine: up to date Tdap vaccine: due, check with your insurance company for coverage Shingles vaccine: due, check with your insurance company for coverage     Advanced directives: copy on file   Conditions/risks identified: smoking cessation discussed- not ready to quit at this time  Next appointment: follow up in one year for your annual wellness exam.   Preventive Care 30 Years and Older, Female Preventive care refers to lifestyle choices and visits with your health care provider that can promote health and wellness. What does preventive care include?  A yearly physical exam. This is also called an annual well check.  Dental exams once or twice a year.  Routine eye exams. Ask your health care provider how often you should have your eyes checked.  Personal lifestyle choices, including:  Daily care of your teeth and gums.  Regular physical activity.  Eating a healthy diet.  Avoiding tobacco and drug use.  Limiting alcohol use.  Practicing safe sex.  Taking low-dose aspirin every day.  Taking vitamin and mineral supplements as recommended by your health care provider. What happens during an annual well check? The services and screenings done by your health care provider during your annual well check will depend on your age, overall health, lifestyle risk factors, and  family history of disease. Counseling  Your health care provider may ask you questions about your:  Alcohol use.  Tobacco use.  Drug use.  Emotional well-being.  Home and relationship well-being.  Sexual activity.  Eating habits.  History of falls.  Memory and ability to understand (cognition).  Work and work Statistician.  Reproductive health. Screening  You may have the following tests or measurements:  Height, weight, and BMI.  Blood pressure.  Lipid and cholesterol levels. These may be checked every 5 years, or more frequently if you are over 61 years old.  Skin check.  Lung cancer screening. You may have this screening every year starting at age 58 if you have a 30-pack-year history of smoking and currently smoke or have quit within the past 15 years.  Fecal occult blood test (FOBT) of the stool. You may have this test every year starting at age 61.  Flexible sigmoidoscopy or colonoscopy. You may have a sigmoidoscopy every 5 years or a colonoscopy every 10 years starting at age 54.  Hepatitis C blood test.  Hepatitis B blood test.  Sexually transmitted disease (STD) testing.  Diabetes screening. This is done by checking your blood sugar (glucose) after you have not eaten for a while (fasting). You may have this done every 1-3 years.  Bone density scan. This is done to screen for osteoporosis. You may have this done starting at age 28.  Mammogram. This may be done every 1-2 years. Talk to your health care provider about how often you should have regular mammograms. Talk with your health care provider about your test results, treatment options, and if necessary, the need for more tests.  Vaccines  Your health care provider may recommend certain vaccines, such as:  Influenza vaccine. This is recommended every year.  Tetanus, diphtheria, and acellular pertussis (Tdap, Td) vaccine. You may need a Td booster every 10 years.  Zoster vaccine. You may need this  after age 67.  Pneumococcal 13-valent conjugate (PCV13) vaccine. One dose is recommended after age 75.  Pneumococcal polysaccharide (PPSV23) vaccine. One dose is recommended after age 98. Talk to your health care provider about which screenings and vaccines you need and how often you need them. This information is not intended to replace advice given to you by your health care provider. Make sure you discuss any questions you have with your health care provider. Document Released: 10/05/2015 Document Revised: 05/28/2016 Document Reviewed: 07/10/2015 Elsevier Interactive Patient Education  2017 [place] Prevention in the Home Falls can cause injuries. They can happen to people of all ages. There are many things you can do to make your home safe and to help prevent falls. What can I do on the outside of my home?  Regularly fix the edges of walkways and driveways and fix any cracks.  Remove anything that might make you trip as you walk through a door, such as a raised step or threshold.  Trim any bushes or trees on the path to your home.  Use bright outdoor lighting.  Clear any walking paths of anything that might make someone trip, such as rocks or tools.  Regularly check to see if handrails are loose or broken. Make sure that both sides of any steps have handrails.  Any raised decks and porches should have guardrails on the edges.  Have any leaves, snow, or ice cleared regularly.  Use sand or salt on walking paths during winter.  Clean up any spills in your garage right away. This includes oil or grease spills. What can I do in the bathroom?  Use night lights.  Install grab bars by the toilet and in the tub and shower. Do not use towel bars as grab bars.  Use non-skid mats or decals in the tub or shower.  If you need to sit down in the shower, use a plastic, non-slip stool.  Keep the floor dry. Clean up any water that spills on the floor as soon as it happens.   Remove soap buildup in the tub or shower regularly.  Attach bath mats securely with double-sided non-slip rug tape.  Do not have throw rugs and other things on the floor that can make you trip. What can I do in the bedroom?  Use night lights.  Make sure that you have a light by your bed that is easy to reach.  Do not use any sheets or blankets that are too big for your bed. They should not hang down onto the floor.  Have a firm chair that has side arms. You can use this for support while you get dressed.  Do not have throw rugs and other things on the floor that can make you trip. What can I do in the kitchen?  Clean up any spills right away.  Avoid walking on wet floors.  Keep items that you use a lot in easy-to-reach places.  If you need to reach something above you, use a strong step stool that has a grab bar.  Keep electrical cords out of the way.  Do not use floor polish or wax that makes floors slippery. If you must use wax, use non-skid floor wax.  Do not have throw rugs and other things on the floor that can make you trip. What can I do with my stairs?  Do not leave any items on the stairs.  Make sure that there are handrails on both sides of the stairs and use them. Fix handrails that are broken or loose. Make sure that handrails are as long as the stairways.  Check any carpeting to make sure that it is firmly attached to the stairs. Fix any carpet that is loose or worn.  Avoid having throw rugs at the top or bottom of the stairs. If you do have throw rugs, attach them to the floor with carpet tape.  Make sure that you have a light switch at the top of the stairs and the bottom of the stairs. If you do not have them, ask someone to add them for you. What else can I do to help prevent falls?  Wear shoes that:  Do not have high heels.  Have rubber bottoms.  Are comfortable and fit you well.  Are closed at the toe. Do not wear sandals.  If you use a  stepladder:  Make sure that it is fully opened. Do not climb a closed stepladder.  Make sure that both sides of the stepladder are locked into place.  Ask someone to hold it for you, if possible.  Clearly mark and make sure that you can see:  Any grab bars or handrails.  First and last steps.  Where the edge of each step is.  Use tools that help you move around (mobility aids) if they are needed. These include:  Canes.  Walkers.  Scooters.  Crutches.  Turn on the lights when you go into a dark area. Replace any light bulbs as soon as they burn out.  Set up your furniture so you have a clear path. Avoid moving your furniture around.  If any of your floors are uneven, fix them.  If there are any pets around you, be aware of where they are.  Review your medicines with your doctor. Some medicines can make you feel dizzy. This can increase your chance of falling. Ask your doctor what other things that you can do to help prevent falls. This information is not intended to replace advice given to you by your health care provider. Make sure you discuss any questions you have with your health care provider. Document Released: 07/05/2009 Document Revised: 02/14/2016 Document Reviewed: 10/13/2014 Elsevier Interactive Patient Education  2017 [employer].

## 2019-03-15 NOTE — Progress Notes (Signed)
Subjective:   [name] is a 78 y.o. female who presents for Medicare Annual (Subsequent) preventive examination.  This visit is being conducted via phone call  - after an attmept to do on video chat - due to the COVID-19 pandemic. This patient has given me verbal consent via phone to conduct this visit, patient states they are participating from their home address. Some vital signs may be absent or patient reported.   Patient identification: identified by name, DOB, and current address.    Review of Systems:  Cardiac Risk Factors include: advanced age (>33men, >37 women);dyslipidemia;hypertension     Objective:     Vitals: LMP  (LMP Unknown)   There is no height or weight on file to calculate BMI.  Advanced Directives 03/15/2019 02/16/2017 08/13/2015 08/13/2015 08/03/2015  Does Patient Have a Medical Advance Directive? Yes Yes Yes Yes Yes  Type of Advance Directive Living will;Healthcare Power of [place];Living will Healthcare Power of [place];Living will [place];Living will  Does patient want to make changes to medical advance directive? - - No - Patient declined No - Patient declined No - Patient declined  Copy of [place] in Chart? Yes - validated most recent copy scanned in chart (See row information) - Yes Yes Yes    Tobacco Social History   Tobacco Use  Smoking Status Current Every Day Smoker  . Packs/day: 0.50  . Years: 40.00  . Pack years: 20.00  Smokeless Tobacco Never Used     Ready to quit: No Counseling given: Yes   Clinical Intake:  Pre-visit preparation completed: Yes  Pain : No/denies pain     Nutritional Risks: None Diabetes: No  How often do you need to have someone help you when you read instructions, pamphlets, or other written materials from your doctor or pharmacy?: 1 - Never  Interpreter Needed?: No  Information entered by ::  Carlie Corpus,LPN  Past Medical History:  Diagnosis Date  . Allergy   . Anemia   . Cancer ([place])    skin cancer  . Hyperlipidemia   . Hypertension   . Hypothyroidism   . Sciatica   . Sleep apnea   . Thyroid disease    Past Surgical History:  Procedure Laterality Date  . ABDOMINAL HYSTERECTOMY    . APPENDECTOMY    . BACK SURGERY    . BILATERAL CARPAL TUNNEL RELEASE    . BREAST CYST ASPIRATION Left    neg  . COLONOSCOPY    . JOINT REPLACEMENT    . KNEE SURGERY Bilateral   . KNEE SURGERY    . 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 Breast cancer Paternal Aunt   . Dementia Mother   . Dementia Sister   . Heart attack Brother   . Diabetes Brother   . Heart attack Son    Social History   Socioeconomic History  . Marital status: Married    Spouse name: Not on file  . Number of children: Not on file  . Years of education: Not on file  . Highest education level: Not on file  Occupational History  . Occupation: retired  Scientific laboratory technician  . Financial resource strain: Not hard at all  . Food insecurity    Worry:  Never true    Inability: Never true  . Transportation needs    Medical: No    Non-medical: No  Tobacco Use  . Smoking status: Current Every Day Smoker    Packs/day: 0.50    Years: 40.00    Pack years: 20.00  . Smokeless tobacco: Never Used  Substance and Sexual Activity  . Alcohol use: No  . Drug use: No  . Sexual activity: Not Currently  Lifestyle  . Physical activity    Days per week: 5 days    Minutes per session: 20 min  . Stress: Not at all  Relationships  . Social connections    Talks on phone: More than three times a week    Gets together: Once a week    Attends religious service: Never    Active member of club or organization: No    Attends meetings of clubs or  organizations: Never    Relationship status: Married  Other Topics Concern  . Not on file  Social History Narrative  . Not on file    Outpatient Encounter Medications as of 03/15/2019  Medication Sig  . ALPRAZolam (XANAX) 0.25 MG tablet Take 1 tablet (0.25 mg total) by mouth at bedtime as needed for anxiety or sleep.  Marland Kitchen amLODipine (NORVASC) 10 MG tablet TAKE 1 TABLET(10 MG) BY MOUTH DAILY  . Azelastine HCl 0.15 % SOLN Place into the nose.  . cetirizine (ZYRTEC) 10 MG tablet TAKE 1 TABLET BY MOUTH EVERY DAY  . citalopram (CELEXA) 20 MG tablet Take 1 tablet (20 mg total) by mouth daily.  . furosemide (LASIX) 20 MG tablet Take 1 tablet (20 mg total) by mouth daily.  Marland Kitchen gemfibrozil (LOPID) 600 MG tablet Take 1 tablet (600 mg total) by mouth 2 (two) times daily before a meal.  . levothyroxine (SYNTHROID, LEVOTHROID) 137 MCG tablet TK 1 T PO QD ON EMPTY STOMACH  . losartan (COZAAR) 50 MG tablet Take 1 tablet (50 mg total) by mouth daily.  . metoprolol succinate (TOPROL-XL) 50 MG 24 hr tablet Take 1 tablet (50 mg total) by mouth daily.  . montelukast (SINGULAIR) 10 MG tablet Take 1 tablet (10 mg total) by mouth daily.  . Multiple Vitamins-Minerals (CENTRUM SILVER PO) Take by mouth.  Marland Kitchen rOPINIRole (REQUIP) 0.5 MG tablet Take 1 tablet (0.5 mg total) by mouth at bedtime.  . simvastatin (ZOCOR) 40 MG tablet Take 1 tablet (40 mg total) by mouth at bedtime.  . traZODone (DESYREL) 50 MG tablet Take 1 tablet (50 mg total) by mouth at bedtime.  . OxyCODONE HCl, Abuse Deter, 5 MG TABA Take 5 mg by mouth once.   . [DISCONTINUED] trimethoprim-polymyxin b (POLYTRIM) ophthalmic solution Place 1 drop into the left eye every 4 (four) hours. For up to 7-10 days (Patient not taking: Reported on 03/15/2019)   No facility-administered encounter medications on file as of 03/15/2019.     Activities of Daily Living In your present state of health, do you have any difficulty performing the following activities:  03/15/2019  Hearing? N  Vision? N  Difficulty concentrating or making decisions? N  Walking or climbing stairs? N  Dressing or bathing? N  Doing errands, shopping? N  Preparing Food and eating ? N  Using the Toilet? N  In the past six months, have you accidently leaked urine? N  Do you have problems with loss of bowel control? N  Managing your Medications? N  Managing your Finances? N  Housekeeping or managing your Housekeeping?  N  Some recent data might be hidden    Patient Care Team: Mikey College, NP as PCP - General (Nurse Practitioner)    Assessment:   This is a routine wellness examination for Georgi.  Exercise Activities and Dietary recommendations Current Exercise Habits: The patient does not participate in regular exercise at present;Home exercise routine, Type of exercise: walking, Time (Minutes): 20, Frequency (Times/Week): 5, Weekly Exercise (Minutes/Week): 100, Intensity: Mild, Exercise limited by: None identified  Goals    . Quit Smoking       Fall Risk: Fall Risk  03/15/2019 12/17/2017 12/07/2017 11/23/2017  Falls in the past year? 0 No No No    FALL RISK PREVENTION PERTAINING TO THE HOME:  Any stairs in or around the home? Yes  ramp as well  If so, are there any without handrails? No   Home free of loose throw rugs in walkways, pet beds, electrical cords, etc? Yes  Adequate lighting in your home to reduce risk of falls? Yes   ASSISTIVE DEVICES UTILIZED TO PREVENT FALLS:  Life alert? No  Use of a cane, walker or w/c? No  Grab bars in the bathroom? No  Shower chair or bench in shower? Yes  Elevated toilet seat or a handicapped toilet? Yes   DME ORDERS:  DME order needed?  No   TIMED UP AND GO:  Unable to perform    Depression Screen PHQ 2/9 Scores 03/15/2019 02/04/2019 10/28/2018 12/17/2017  PHQ - 2 Score 0 0 0 0  PHQ- 9 Score - 4 1 -     Cognitive Function        Immunization History  Administered Date(s) Administered  .  Influenza, High Dose Seasonal PF 06/04/2017, 06/29/2018  . Pneumococcal Conjugate-13 08/05/2018  . Pneumococcal Polysaccharide-23 06/04/2017    Qualifies for Shingles Vaccine? Yes  Zostavax completed n/a. Due for Shingrix. Education has been provided regarding the importance of this vaccine. Pt has been advised to call insurance company to determine out of pocket expense. Advised may also receive vaccine at local pharmacy or Health Dept. Verbalized acceptance and understanding.  Tdap: Discussed need for TD/TDAP vaccine, patient verbalized understanding that this is not covered as a preventative with there insurance and to call the office if she gets develops any new skin injuries, ie: cuts, scrapes, bug bites, or open wounds.  Flu Vaccine: up to date   Pneumococcal Vaccine: up to date   Screening Tests Health Maintenance  Topic Date Due  . DEXA SCAN  04/14/2019 (Originally 05/19/2007)  . TETANUS/TDAP  03/14/2020 (Originally 05/18/1960)  . INFLUENZA VACCINE  04/23/2019  . PNA vac Low Risk Adult  Completed    Cancer Screenings:  Colorectal Screening: no longer required   Mammogram: Completed 11/14/2018. Repeat every year;   Bone Density: patient okay to do but says she has a simulator in her back and is unsure if she is allowed.  Lung Cancer Screening: (Low Dose CT Chest recommended if Age 71-80 years, 30 pack-year currently smoking OR have quit w/in 15years.) does not qualify.   Additional Screening:  Hepatitis C Screening: does not qualify  Vision Screening: Recommended annual ophthalmology exams for early detection of glaucoma and other disorders of the eye. Is the patient up to date with their annual eye exam?  Yes  Who is the provider or what is the name of the office in which the pt attends annual eye exams? [facility]   Dental Screening: Recommended annual dental exams for proper oral hygiene  Community Resource Referral:  CRR required this visit?  No       Plan:  I  have personally reviewed and addressed the Medicare Annual Wellness questionnaire and have noted the following in the patient's chart:  A. Medical and social history B. Use of alcohol, tobacco or illicit drugs  C. Current medications and supplements D. Functional ability and status E.  Nutritional status F.  Physical activity G. Advance directives H. List of other physicians I.  Hospitalizations, surgeries, and ER visits in previous 12 months J.  [place] such as hearing and vision if needed, cognitive and depression L. Referrals and appointments   In addition, I have reviewed and discussed with patient certain preventive protocols, quality metrics, and best practice recommendations. A written personalized care plan for preventive services as well as general preventive health recommendations were provided to patient.   Signed,    Bevelyn Ngo, LPN  1/61/0960 Nurse Health Advisor   Nurse Notes: none

## 2019-04-12 ENCOUNTER — Other Ambulatory Visit: Payer: Self-pay

## 2019-04-12 ENCOUNTER — Encounter: Payer: Self-pay | Admitting: Nurse Practitioner

## 2019-04-12 ENCOUNTER — Ambulatory Visit (INDEPENDENT_AMBULATORY_CARE_PROVIDER_SITE_OTHER): Payer: PPO | Admitting: Nurse Practitioner

## 2019-04-12 VITALS — BP 135/54 | HR 72 | Ht 61.75 in | Wt 164.2 lb

## 2019-04-12 DIAGNOSIS — I10 Essential (primary) hypertension: Secondary | ICD-10-CM

## 2019-04-12 DIAGNOSIS — R6883 Chills (without fever): Secondary | ICD-10-CM

## 2019-04-12 DIAGNOSIS — E782 Mixed hyperlipidemia: Secondary | ICD-10-CM | POA: Diagnosis not present

## 2019-04-12 DIAGNOSIS — E039 Hypothyroidism, unspecified: Secondary | ICD-10-CM

## 2019-04-12 DIAGNOSIS — F419 Anxiety disorder, unspecified: Secondary | ICD-10-CM

## 2019-04-12 DIAGNOSIS — E213 Hyperparathyroidism, unspecified: Secondary | ICD-10-CM

## 2019-04-12 DIAGNOSIS — F5104 Psychophysiologic insomnia: Secondary | ICD-10-CM

## 2019-04-12 MED ORDER — LOSARTAN POTASSIUM 50 MG PO TABS: 50.0000 mg | ORAL_TABLET | Freq: Every day | ORAL | 3 refills | Status: DC

## 2019-04-12 MED ORDER — METOPROLOL SUCCINATE ER 50 MG PO TB24: 50.0000 mg | ORAL_TABLET | Freq: Every day | ORAL | 1 refills | Status: DC

## 2019-04-12 MED ORDER — ALPRAZOLAM 0.25 MG PO TABS: 0.2500 mg | ORAL_TABLET | Freq: Every evening | ORAL | 5 refills | Status: DC | PRN

## 2019-04-12 MED ORDER — TRAZODONE HCL 50 MG PO TABS: 50.0000 mg | ORAL_TABLET | Freq: Every day | ORAL | 1 refills | Status: DC

## 2019-04-12 MED ORDER — FUROSEMIDE 20 MG PO TABS: 20.0000 mg | ORAL_TABLET | Freq: Every day | ORAL | 1 refills | Status: DC

## 2019-04-12 MED ORDER — GEMFIBROZIL 600 MG PO TABS: 600.0000 mg | ORAL_TABLET | Freq: Two times a day (BID) | ORAL | 1 refills | Status: DC

## 2019-04-12 MED ORDER — SIMVASTATIN 40 MG PO TABS: 40.0000 mg | ORAL_TABLET | Freq: Every day | ORAL | 1 refills | Status: DC

## 2019-04-12 MED ORDER — AMLODIPINE BESYLATE 10 MG PO TABS: ORAL_TABLET | ORAL | 1 refills | Status: DC

## 2019-04-12 NOTE — Patient Instructions (Addendum)
[name],   Thank you for coming in to clinic today.  1. Labs today for evaluating your feeling cold.  2. NO changes to medicines.  Blood pressure looks good today.  Please schedule a follow-up appointment with Cassell Smiles, AGNP. Return in about 6 months (around 10/13/2019) for hypertension, anxiety/sleep.  If you have any other questions or concerns, please feel free to call the clinic or send a message through [place]. You may also schedule an earlier appointment if necessary.  You will receive a survey after today's visit either digitally by e-mail or paper by [employer]. Your experiences and feedback matter to [place].  Please respond so we know how we are doing as we provide care for you.   Cassell Smiles, DNP, AGNP-BC Adult Gerontology Nurse Practitioner [place]

## 2019-04-12 NOTE — Progress Notes (Signed)
Subjective:    Patient ID: [name], female    DOB: 06-14-1941, 78 y.o.   MRN: [id number]  [name] is a 78 y.o. female presenting on 04/12/2019 for Hypertension and Chills (pt states she feels cold all the time. x several yrs, but symptoms seems to be worsening. )  HPI Hypertension - She is not checking BP at home or outside of clinic.    - Current medications: furosemide 20 mg once daily, amlodipine 10 mg once daily, losartan 50 mg daily, metoprolol succinate 50 mg daily, tolerating well without side effects - She is not currently symptomatic. - Pt denies headache, lightheadedness, dizziness, changes in vision, chest tightness/pressure, palpitations, leg swelling, sudden loss of speech or loss of consciousness. 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 - Her diet is moderate in salt, moderate in fat, and moderate in carbohydrates.   Chills Patient continues to have chills, worsening  Keeps thermostat at 76 in her home and still "feels cold inside."  This complaint was evaluated in February 2020, but continues to progress.  She states she usually feels warm to the touch.  She also usually does not shiver.  She is able to feel warm at night when she is in bed under covers or when wearing long sleeves/sweater indoors.  Insomnia, Anxiety Patient notes stable anxiety and insomnia.  Continues on celexa 20 mg daily.  She also takes trazodone 50 mg and alprazolam 0.25 mg once daily at bedtime.  She notes jitteriness at bed and alprazolam helps her to calm and go to sleep.  She has taken this for many years.  Notes that she sleeps much better when taking.  Last fill 04/06/2019 PDMP reviewed.  Social History   Tobacco Use  . Smoking status: Current Every Day Smoker    Packs/day: 0.50    Years: 40.00    Pack years: 20.00  . Smokeless tobacco: Never Used  Substance Use Topics  . Alcohol use: No  . Drug  use: No    Review of Systems Per HPI unless specifically indicated above     Objective:    BP (!) 135/54   Pulse 72   Ht 5' 1.75" (1.568 m)   Wt 164 lb 3.2 oz (74.5 kg)   LMP  (LMP Unknown)   BMI 30.28 kg/m   Wt Readings from Last 3 Encounters:  04/12/19 164 lb 3.2 oz (74.5 kg)  02/04/19 164 lb 9.6 oz (74.7 kg)  10/28/18 162 lb 3.2 oz (73.6 kg)    Physical Exam Vitals signs reviewed.  Constitutional:      General: She is awake. She is not in acute distress.    Appearance: Normal appearance. She is well-developed. She is not diaphoretic.  HENT:     Head: Normocephalic and atraumatic.     Mouth/Throat:     Lips: Pink.     Mouth: Mucous membranes are moist.     Dentition: Has dentures (full upper/lower).     Tongue: No lesions. Tongue does not deviate from midline.     Comments: Normal coloration of tongue surface and when lifted.   Neck:     Musculoskeletal: Normal range of motion and neck supple.     Thyroid: No thyroid mass, thyromegaly or thyroid tenderness.     Vascular: No carotid bruit.  Cardiovascular:     Rate and Rhythm: Normal rate and regular rhythm.  Pulses:          Radial pulses are 2+ on the right side and 2+ on the left side.       Posterior tibial pulses are 1+ on the right side and 1+ on the left side.     Heart sounds: Normal heart sounds, S1 normal and S2 normal.  Pulmonary:     Effort: Pulmonary effort is normal. No respiratory distress.     Breath sounds: Normal breath sounds and air entry.  Musculoskeletal:     Right lower leg: No edema.     Left lower leg: No edema.  Skin:    General: Skin is warm and dry.     Comments: Normal nails  Neurological:     Mental Status: She is alert and oriented to person, place, and time.  Psychiatric:        Attention and Perception: Attention normal.        Mood and Affect: Mood and affect normal.        Behavior: Behavior normal. Behavior is cooperative.        Thought Content: Thought content  normal.        Judgment: Judgment normal.    Results for orders placed or performed in visit on 10/28/18  Lipid panel  Result Value Ref Range   Cholesterol 207 (H) <200 mg/dL   HDL 52 > OR = 50 mg/dL   Triglycerides 124 <150 mg/dL   LDL Cholesterol (Calc) 132 (H) mg/dL (calc)   Total CHOL/HDL Ratio 4.0 <5.0 (calc)   Non-HDL Cholesterol (Calc) 155 (H) <130 mg/dL (calc)  COMPLETE METABOLIC PANEL WITH GFR  Result Value Ref Range   Glucose, Bld 97 65 - 99 mg/dL   BUN 15 7 - 25 mg/dL   Creat 0.73 0.60 - 0.93 mg/dL   GFR, Est Non African American 80 > OR = 60 mL/min/1.76m2   GFR, Est African American 93 > OR = 60 mL/min/1.46m2   BUN/Creatinine Ratio NOT APPLICABLE 6 - 22 (calc)   Sodium 142 135 - 146 mmol/L   Potassium 4.2 3.5 - 5.3 mmol/L   Chloride 107 98 - 110 mmol/L   CO2 25 20 - 32 mmol/L   Calcium 11.3 (H) 8.6 - 10.4 mg/dL   Total Protein 7.4 6.1 - 8.1 g/dL   Albumin 4.6 3.6 - 5.1 g/dL   Globulin 2.8 1.9 - 3.7 g/dL (calc)   AG Ratio 1.6 1.0 - 2.5 (calc)   Total Bilirubin 0.9 0.2 - 1.2 mg/dL   Alkaline phosphatase (APISO) 80 37 - 153 U/L   AST 14 10 - 35 U/L   ALT 7 6 - 29 U/L  CBC with Differential/Platelet  Result Value Ref Range   WBC 6.3 3.8 - 10.8 Thousand/uL   RBC 4.88 3.80 - 5.10 Million/uL   Hemoglobin 15.8 (H) 11.7 - 15.5 g/dL   HCT 46.0 (H) 35.0 - 45.0 %   MCV 94.3 80.0 - 100.0 fL   MCH 32.4 27.0 - 33.0 pg   MCHC 34.3 32.0 - 36.0 g/dL   RDW 12.0 11.0 - 15.0 %   Platelets 137 (L) 140 - 400 Thousand/uL   MPV  7.5 - 12.5 fL   Neutro Abs 2,741 1,500 - 7,800 cells/uL   Lymphs Abs 2,589 850 - 3,900 cells/uL   Absolute Monocytes 668 200 - 950 cells/uL   Eosinophils Absolute 214 15 - 500 cells/uL   Basophils Absolute 88 0 - 200 cells/uL   Neutrophils Relative % 43.5 %  Total Lymphocyte 41.1 %   Monocytes Relative 10.6 %   Eosinophils Relative 3.4 %   Basophils Relative 1.4 %  TSH + free T4  Result Value Ref Range   TSH W/REFLEX TO FT4 2.73 0.40 - 4.50 mIU/L       Assessment & Plan:   Problem List Items Addressed This Visit      Cardiovascular and Mediastinum   Hypertension Controlled hypertension.  BP goal < 140/90 due to age and medical frailty.  Pt is working on lifestyle modifications.  Taking medications tolerating well without side effects. No currently identified complications.  Plan: 1. Continue taking amlodipine, furosemide, losartan, metoprolol 2. Obtain labs today  3. Encouraged heart healthy diet and increasing exercise to 30 minutes most days of the week. 4. Check BP 1-2 x per week at home, keep log, and bring to clinic at next appointment. 5. Follow up 6 months.     Relevant Medications   amLODipine (NORVASC) 10 MG tablet   furosemide (LASIX) 20 MG tablet   gemfibrozil (LOPID) 600 MG tablet   losartan (COZAAR) 50 MG tablet   metoprolol succinate (TOPROL-XL) 50 MG 24 hr tablet   simvastatin (ZOCOR) 40 MG tablet   Other Relevant Orders   COMPLETE METABOLIC PANEL WITH GFR     Endocrine   Acquired hypothyroidism Stable by symptoms today.  Continue levothyroxine.  Recheck labs to assess dosing.  Follow-up 6 months.   Relevant Medications   metoprolol succinate (TOPROL-XL) 50 MG 24 hr tablet   Other Relevant Orders   TSH     Other   Anxiety Stable today on exam.  Medications tolerated without side effects.  Continue at current doses.  Refills provided.  Followup 6 months.    Relevant Medications   ALPRAZolam (XANAX) 0.25 MG tablet (Start on 05/06/2019)   traZODone (DESYREL) 50 MG tablet   Psychophysiological insomnia Stable.  Continue alprazolam and trazodone as previously prescribed.  Refills provided.  Encouraged ongoing work on sleep hygiene.  Followup 6 months.   Relevant Medications   ALPRAZolam (XANAX) 0.25 MG tablet (Start on 05/06/2019)   traZODone (DESYREL) 50 MG tablet    Other Visit Diagnoses    Chills    -  Primary Unknown cause.  Previously tested for anemia, thyroid abnormalities. Repeat today due to  worsening of symptom.  Possible to be change in patient perception of temperature.  Reviewed this with patient.  Okay to wear long sleeves, use blankets if no medical cause is identified.  Follow-up prn after labs.    Relevant Orders   COMPLETE METABOLIC PANEL WITH GFR   TSH   CBC with Differential/Platelet   Mixed hyperlipidemia     Stable today on exam.  Medications tolerated without side effects.  Continue at current doses.  Refills provided.  Check labs today. Followup 6 months.    Relevant Medications   amLODipine (NORVASC) 10 MG tablet   furosemide (LASIX) 20 MG tablet   gemfibrozil (LOPID) 600 MG tablet   losartan (COZAAR) 50 MG tablet   metoprolol succinate (TOPROL-XL) 50 MG 24 hr tablet   simvastatin (ZOCOR) 40 MG tablet      Meds ordered this encounter  Medications  . ALPRAZolam (XANAX) 0.25 MG tablet    Sig: Take 1 tablet (0.25 mg total) by mouth at bedtime as needed for anxiety or sleep.    Dispense:  30 tablet    Refill:  5    Order Specific Question:   Supervising Provider  Answer:   Olin Hauser [[id number]]    Follow up plan: Return in about 6 months (around 10/13/2019) for hypertension, anxiety/sleep.  Cassell Smiles, DNP, AGPCNP-BC Adult Gerontology Primary Care Nurse Practitioner [facility] 04/12/2019, 10:42 AM

## 2019-04-13 ENCOUNTER — Encounter: Payer: Self-pay | Admitting: Nurse Practitioner

## 2019-04-13 LAB — TSH: TSH: 0.57 mIU/L (ref 0.40–4.50)

## 2019-04-13 LAB — COMPLETE METABOLIC PANEL WITH GFR
AG Ratio: 2 (calc) (ref 1.0–2.5)
ALT: 9 U/L (ref 6–29)
AST: 16 U/L (ref 10–35)
Albumin: 4.7 g/dL (ref 3.6–5.1)
Alkaline phosphatase (APISO): 76 U/L (ref 37–153)
BUN: 16 mg/dL (ref 7–25)
CO2: 25 mmol/L (ref 20–32)
Calcium: 11.2 mg/dL — ABNORMAL HIGH (ref 8.6–10.4)
Chloride: 109 mmol/L (ref 98–110)
Creat: 0.81 mg/dL (ref 0.60–0.93)
GFR, Est African American: 82 mL/min/{1.73_m2} (ref >=60)
GFR, Est Non African American: 71 mL/min/{1.73_m2} (ref >=60)
Globulin: 2.4 g/dL (calc) (ref 1.9–3.7)
Glucose, Bld: 103 mg/dL — ABNORMAL HIGH (ref 65–99)
Potassium: 3.7 mmol/L (ref 3.5–5.3)
Sodium: 141 mmol/L (ref 135–146)
Total Bilirubin: 0.9 mg/dL (ref 0.2–1.2)
Total Protein: 7.1 g/dL (ref 6.1–8.1)

## 2019-04-13 LAB — CBC WITH DIFFERENTIAL/PLATELET
Absolute Monocytes: 731 cells/uL (ref 200–950)
Basophils Absolute: 63 cells/uL (ref 0–200)
Basophils Relative: 1 %
Eosinophils Absolute: 107 cells/uL (ref 15–500)
Eosinophils Relative: 1.7 %
HCT: 43 % (ref 35.0–45.0)
Hemoglobin: 14.9 g/dL (ref 11.7–15.5)
Lymphs Abs: 2325 cells/uL (ref 850–3900)
MCH: 32.5 pg (ref 27.0–33.0)
MCHC: 34.7 g/dL (ref 32.0–36.0)
MCV: 93.9 fL (ref 80.0–100.0)
MPV: 14.2 fL — ABNORMAL HIGH (ref 7.5–12.5)
Monocytes Relative: 11.6 %
Neutro Abs: 3074 cells/uL (ref 1500–7800)
Neutrophils Relative %: 48.8 %
Platelets: 76 10*3/uL — ABNORMAL LOW (ref 140–400)
RBC: 4.58 10*6/uL (ref 3.80–5.10)
RDW: 12.3 % (ref 11.0–15.0)
Total Lymphocyte: 36.9 %
WBC: 6.3 10*3/uL (ref 3.8–10.8)

## 2019-04-14 ENCOUNTER — Other Ambulatory Visit: Payer: PPO

## 2019-04-15 DIAGNOSIS — E213 Hyperparathyroidism, unspecified: Secondary | ICD-10-CM | POA: Insufficient documentation

## 2019-04-15 NOTE — Addendum Note (Signed)
Addended by: Cassell Smiles R on: 04/15/2019 12:06 PM   Modules accepted: Orders

## 2019-04-19 LAB — VITAMIN D 25 HYDROXY (VIT D DEFICIENCY, FRACTURES): Vit D, 25-Hydroxy: 37 ng/mL (ref 30–100)

## 2019-04-19 LAB — CALCIUM, IONIZED: Calcium, Ion: 6 mg/dL — ABNORMAL HIGH (ref 4.8–5.6)

## 2019-04-19 LAB — PTH-RELATED PEPTIDE: PTH-Related Protein (PTH-RP): 9 pg/mL — ABNORMAL LOW (ref 14–27)

## 2019-04-19 LAB — VITAMIN D 1,25 DIHYDROXY
Vitamin D 1, 25 (OH)2 Total: 55 pg/mL (ref 18–72)
Vitamin D2 1, 25 (OH)2: <8 pg/mL
Vitamin D3 1, 25 (OH)2: 55 pg/mL

## 2019-04-19 LAB — PARATHYROID HORMONE, INTACT (NO CA): PTH: 124 pg/mL — ABNORMAL HIGH (ref 14–64)

## 2019-04-27 ENCOUNTER — Other Ambulatory Visit: Payer: Self-pay | Admitting: Nurse Practitioner

## 2019-04-27 DIAGNOSIS — E039 Hypothyroidism, unspecified: Secondary | ICD-10-CM

## 2019-05-06 ENCOUNTER — Other Ambulatory Visit: Payer: Self-pay | Admitting: Nurse Practitioner

## 2019-05-06 DIAGNOSIS — F5104 Psychophysiologic insomnia: Secondary | ICD-10-CM

## 2019-05-06 DIAGNOSIS — F419 Anxiety disorder, unspecified: Secondary | ICD-10-CM

## 2019-05-09 DIAGNOSIS — E210 Primary hyperparathyroidism: Secondary | ICD-10-CM | POA: Diagnosis not present

## 2019-05-09 DIAGNOSIS — M8588 Other specified disorders of bone density and structure, other site: Secondary | ICD-10-CM | POA: Diagnosis not present

## 2019-05-17 DIAGNOSIS — E210 Primary hyperparathyroidism: Secondary | ICD-10-CM | POA: Diagnosis not present

## 2019-05-25 DIAGNOSIS — E210 Primary hyperparathyroidism: Secondary | ICD-10-CM | POA: Diagnosis not present

## 2019-05-26 ENCOUNTER — Other Ambulatory Visit: Payer: Self-pay | Admitting: Nurse Practitioner

## 2019-05-26 DIAGNOSIS — J302 Other seasonal allergic rhinitis: Secondary | ICD-10-CM

## 2019-06-02 ENCOUNTER — Ambulatory Visit: Payer: PPO | Admitting: Nurse Practitioner

## 2019-06-06 ENCOUNTER — Ambulatory Visit (INDEPENDENT_AMBULATORY_CARE_PROVIDER_SITE_OTHER): Payer: PPO | Admitting: Nurse Practitioner

## 2019-06-06 ENCOUNTER — Encounter: Payer: Self-pay | Admitting: Nurse Practitioner

## 2019-06-06 ENCOUNTER — Other Ambulatory Visit: Payer: Self-pay

## 2019-06-06 VITALS — BP 126/66 | HR 75 | Ht 61.75 in | Wt 163.6 lb

## 2019-06-06 DIAGNOSIS — M85852 Other specified disorders of bone density and structure, left thigh: Secondary | ICD-10-CM | POA: Diagnosis not present

## 2019-06-06 DIAGNOSIS — Z719 Counseling, unspecified: Secondary | ICD-10-CM | POA: Diagnosis not present

## 2019-06-06 DIAGNOSIS — E213 Hyperparathyroidism, unspecified: Secondary | ICD-10-CM | POA: Diagnosis not present

## 2019-06-06 DIAGNOSIS — Z23 Encounter for immunization: Secondary | ICD-10-CM | POA: Diagnosis not present

## 2019-06-06 NOTE — Patient Instructions (Addendum)
[name],   Thank you for coming in to clinic today.  1. Flu shot today  2. Continue your 6 month follow up with Dr. Gabriel Carina (around February). - Thyroid is normal - Parathyroid glands are tell your body to have more calcium in your blood than you need.   - We don't know what is making you feel cold.  This could be a thermostat, nerve perception problem that will not be fixed.  Please schedule a follow-up appointment with Cassell Smiles, AGNP. Return in about 4 months (around 10/06/2019) for hypertension, anxiety.  If you have any other questions or concerns, please feel free to call the clinic or send a message through [place]. You may also schedule an earlier appointment if necessary.  You will receive a survey after today's visit either digitally by e-mail or paper by [employer]. Your experiences and feedback matter to [place].  Please respond so we know how we are doing as we provide care for you.   Cassell Smiles, DNP, AGNP-BC Adult Gerontology Nurse Practitioner [place]

## 2019-06-06 NOTE — Progress Notes (Signed)
Subjective:    Patient ID: [name], female    DOB: 1941-05-15, 78 y.o.   MRN: [id number]  [name] is a 78 y.o. female presenting on 06/06/2019 for Chills (pt want to discuss her bone density, parathyroid ultrasound result x 2-3 weeks )  HPI Cold sensation Patient continues to desire to find correctible cause of why she feels cold.  Her past labs have been normal.  She "feels cold inside," has cool home with A/C for her husband's preference.  She is able to warm up when she walks outside.  Hyperparathyroidism Patient desires to review further her test results from Dr. Joycie Peek office about her parathyroid glands.  She completed bone density, ultrasound, blood testing, and 24 hr urine calcium. Patient states she was unable to understand the medical terms used when she was called with her results.  Social History   Tobacco Use  . Smoking status: Current Every Day Smoker    Packs/day: 0.50    Years: 40.00    Pack years: 20.00  . Smokeless tobacco: Never Used  Substance Use Topics  . Alcohol use: No  . Drug use: No    Review of Systems Per HPI unless specifically indicated above     Objective:    BP 126/66 (BP Location: Left Arm, Patient Position: Sitting, Cuff Size: Normal)   Pulse 75   Ht 5' 1.75" (1.568 m)   Wt 163 lb 9.6 oz (74.2 kg)   LMP  (LMP Unknown)   BMI 30.17 kg/m   Wt Readings from Last 3 Encounters:  06/06/19 163 lb 9.6 oz (74.2 kg)  04/12/19 164 lb 3.2 oz (74.5 kg)  02/04/19 164 lb 9.6 oz (74.7 kg)    Physical Exam Vitals signs reviewed.  Constitutional:      General: She is not in acute distress.    Appearance: She is well-developed.  HENT:     Head: Normocephalic and atraumatic.  Neck:     Musculoskeletal: Normal range of motion and neck supple.     Comments: No thyromegaly, thyroid or other neck tenderness. Cardiovascular:     Rate and Rhythm: Normal rate and regular rhythm.     Pulses: Normal pulses.     Heart sounds: Normal  heart sounds.  Skin:    General: Skin is warm and dry.  Neurological:     Mental Status: She is alert and oriented to person, place, and time.  Psychiatric:        Behavior: Behavior normal.    Results for orders placed or performed in visit on 04/12/19  COMPLETE METABOLIC PANEL WITH GFR  Result Value Ref Range   Glucose, Bld 103 (H) 65 - 99 mg/dL   BUN 16 7 - 25 mg/dL   Creat 0.81 0.60 - 0.93 mg/dL   GFR, Est Non African American 71 > OR = 60 mL/min/1.16m   GFR, Est African American 82 > OR = 60 mL/min/1.724m  BUN/Creatinine Ratio NOT APPLICABLE 6 - 22 (calc)   Sodium 141 135 - 146 mmol/L   Potassium 3.7 3.5 - 5.3 mmol/L   Chloride 109 98 - 110 mmol/L   CO2 25 20 - 32 mmol/L   Calcium 11.2 (H) 8.6 - 10.4 mg/dL   Total Protein 7.1 6.1 - 8.1 g/dL   Albumin 4.7 3.6 - 5.1 g/dL   Globulin 2.4 1.9 - 3.7 g/dL (calc)   AG Ratio 2.0 1.0 - 2.5 (calc)   Total Bilirubin 0.9 0.2 - 1.2 mg/dL  Alkaline phosphatase (APISO) 76 37 - 153 U/L   AST 16 10 - 35 U/L   ALT 9 6 - 29 U/L  TSH  Result Value Ref Range   TSH 0.57 0.40 - 4.50 mIU/L  CBC with Differential/Platelet  Result Value Ref Range   WBC 6.3 3.8 - 10.8 Thousand/uL   RBC 4.58 3.80 - 5.10 Million/uL   Hemoglobin 14.9 11.7 - 15.5 g/dL   HCT 43.0 35.0 - 45.0 %   MCV 93.9 80.0 - 100.0 fL   MCH 32.5 27.0 - 33.0 pg   MCHC 34.7 32.0 - 36.0 g/dL   RDW 12.3 11.0 - 15.0 %   Platelets 76 (L) 140 - 400 Thousand/uL   MPV 14.2 (H) 7.5 - 12.5 fL   Neutro Abs 3,074 1,500 - 7,800 cells/uL   Lymphs Abs 2,325 850 - 3,900 cells/uL   Absolute Monocytes 731 200 - 950 cells/uL   Eosinophils Absolute 107 15 - 500 cells/uL   Basophils Absolute 63 0 - 200 cells/uL   Neutrophils Relative % 48.8 %   Total Lymphocyte 36.9 %   Monocytes Relative 11.6 %   Eosinophils Relative 1.7 %   Basophils Relative 1.0 %  Parathyroid hormone, intact (no Ca)  Result Value Ref Range   PTH 124 (H) 14 - 64 pg/mL  Calcium, ionized  Result Value Ref Range    Calcium, Ion 6.0 (H) 4.8 - 5.6 mg/dL  PTH-Related Peptide  Result Value Ref Range   PTH-Related Protein (PTH-RP) 9 (L) 14 - 27 pg/mL  VITAMIN D 25 Hydroxy (Vit-D Deficiency, Fractures)  Result Value Ref Range   Vit D, 25-Hydroxy 37 30 - 100 ng/mL  Vitamin D 1,25 dihydroxy  Result Value Ref Range   Vitamin D 1, 25 (OH)2 Total 55 18 - 72 pg/mL   Vitamin D3 1, 25 (OH)2 55 pg/mL   Vitamin D2 1, 25 (OH)2 <8 pg/mL      Assessment & Plan:   Problem List Items Addressed This Visit      Endocrine   Hyperparathyroidism ([place]) - Primary    Other Visit Diagnoses    Counseling, unspecified       Need for immunization against influenza       Relevant Orders   Flu Vaccine QUAD High Dose(Fluad) (Completed)   Osteopenia of left femoral neck        Patient needing counseling about hyperparathyroid workup results.  Independently reviewed labs (PTH, calcium, alk phos, ultrasound reading, bone density scan report for patient via Care Everywhere. PTH and calcium remain elevated.  Ultrasound showed no enlarged parathyroid glands, bone density showed osteopenia. - Reviewed with patient that no medical treatment will be required.  Surveillance requested in 6 months. - Encouraged patient to consume limited dietary calcium/avoid supplements due to hypercalcemia, but should regularly participate in weight bearing activity.   - Patient had all questions answered satisfactorily.    Follow up plan: Return in about 4 months (around 10/06/2019) for hypertension, anxiety.   A total of 17 minutes was spent face-to-face with this patient. Greater than 50% of this time was spent in counseling and coordination of care with the patient.   Cassell Smiles, DNP, AGPCNP-BC Adult Gerontology Primary Care Nurse Practitioner [facility] 06/06/2019, 11:20 AM

## 2019-06-09 ENCOUNTER — Encounter: Payer: Self-pay | Admitting: Nurse Practitioner

## 2019-06-09 DIAGNOSIS — M85852 Other specified disorders of bone density and structure, left thigh: Secondary | ICD-10-CM | POA: Insufficient documentation

## 2019-06-16 ENCOUNTER — Other Ambulatory Visit: Payer: Self-pay | Admitting: Nurse Practitioner

## 2019-06-16 DIAGNOSIS — L570 Actinic keratosis: Secondary | ICD-10-CM | POA: Diagnosis not present

## 2019-06-16 DIAGNOSIS — X32XXXA Exposure to sunlight, initial encounter: Secondary | ICD-10-CM | POA: Diagnosis not present

## 2019-06-16 DIAGNOSIS — E782 Mixed hyperlipidemia: Secondary | ICD-10-CM

## 2019-06-16 DIAGNOSIS — C44519 Basal cell carcinoma of skin of other part of trunk: Secondary | ICD-10-CM | POA: Diagnosis not present

## 2019-06-16 DIAGNOSIS — Z85828 Personal history of other malignant neoplasm of skin: Secondary | ICD-10-CM | POA: Diagnosis not present

## 2019-06-16 DIAGNOSIS — Z872 Personal history of diseases of the skin and subcutaneous tissue: Secondary | ICD-10-CM | POA: Diagnosis not present

## 2019-06-16 DIAGNOSIS — Z08 Encounter for follow-up examination after completed treatment for malignant neoplasm: Secondary | ICD-10-CM | POA: Diagnosis not present

## 2019-06-16 DIAGNOSIS — D485 Neoplasm of uncertain behavior of skin: Secondary | ICD-10-CM | POA: Diagnosis not present

## 2019-06-17 DIAGNOSIS — S52532A Colles' fracture of left radius, initial encounter for closed fracture: Secondary | ICD-10-CM | POA: Diagnosis not present

## 2019-06-17 DIAGNOSIS — S59902A Unspecified injury of left elbow, initial encounter: Secondary | ICD-10-CM | POA: Diagnosis not present

## 2019-06-17 DIAGNOSIS — S52122A Displaced fracture of head of left radius, initial encounter for closed fracture: Secondary | ICD-10-CM | POA: Diagnosis not present

## 2019-06-17 DIAGNOSIS — S52612A Displaced fracture of left ulna styloid process, initial encounter for closed fracture: Secondary | ICD-10-CM | POA: Diagnosis not present

## 2019-06-17 DIAGNOSIS — S59912A Unspecified injury of left forearm, initial encounter: Secondary | ICD-10-CM | POA: Diagnosis not present

## 2019-06-17 DIAGNOSIS — M25522 Pain in left elbow: Secondary | ICD-10-CM | POA: Diagnosis not present

## 2019-06-17 DIAGNOSIS — W010XXA Fall on same level from slipping, tripping and stumbling without subsequent striking against object, initial encounter: Secondary | ICD-10-CM | POA: Diagnosis not present

## 2019-06-20 DIAGNOSIS — S52532A Colles' fracture of left radius, initial encounter for closed fracture: Secondary | ICD-10-CM | POA: Diagnosis not present

## 2019-06-21 ENCOUNTER — Encounter
Admission: RE | Admit: 2019-06-21 | Discharge: 2019-06-21 | Disposition: A | Discharge status: Home / Self Care | Payer: PPO | Source: Ambulatory Visit | Attending: Orthopedic Surgery | Admitting: Orthopedic Surgery

## 2019-06-21 ENCOUNTER — Other Ambulatory Visit: Payer: Self-pay

## 2019-06-21 DIAGNOSIS — I10 Essential (primary) hypertension: Secondary | ICD-10-CM | POA: Insufficient documentation

## 2019-06-21 DIAGNOSIS — S52532A Colles' fracture of left radius, initial encounter for closed fracture: Secondary | ICD-10-CM | POA: Diagnosis not present

## 2019-06-21 DIAGNOSIS — F17200 Nicotine dependence, unspecified, uncomplicated: Secondary | ICD-10-CM | POA: Insufficient documentation

## 2019-06-21 DIAGNOSIS — R9431 Abnormal electrocardiogram [ECG] [EKG]: Secondary | ICD-10-CM | POA: Insufficient documentation

## 2019-06-21 DIAGNOSIS — Z20828 Contact with and (suspected) exposure to other viral communicable diseases: Secondary | ICD-10-CM | POA: Insufficient documentation

## 2019-06-21 DIAGNOSIS — Z01818 Encounter for other preprocedural examination: Secondary | ICD-10-CM | POA: Diagnosis not present

## 2019-06-21 HISTORY — DX: Anxiety disorder, unspecified: F41.9

## 2019-06-21 LAB — BASIC METABOLIC PANEL
Anion gap: 9 (ref 5–15)
BUN: 19 mg/dL (ref 8–23)
CO2: 25 mmol/L (ref 22–32)
Calcium: 11 mg/dL — ABNORMAL HIGH (ref 8.9–10.3)
Chloride: 106 mmol/L (ref 98–111)
Creatinine, Ser: 0.78 mg/dL (ref 0.44–1.00)
GFR calc Af Amer: >60 mL/min (ref >60)
GFR calc non Af Amer: >60 mL/min (ref >60)
Glucose, Bld: 119 mg/dL — ABNORMAL HIGH (ref 70–99)
Potassium: 3.6 mmol/L (ref 3.5–5.1)
Sodium: 140 mmol/L (ref 135–145)

## 2019-06-21 LAB — CBC
HCT: 42.9 % (ref 36.0–46.0)
Hemoglobin: 14.3 g/dL (ref 12.0–15.0)
MCH: 31.2 pg (ref 26.0–34.0)
MCHC: 33.3 g/dL (ref 30.0–36.0)
MCV: 93.7 fL (ref 80.0–100.0)
Platelets: 149 10*3/uL — ABNORMAL LOW (ref 150–400)
RBC: 4.58 MIL/uL (ref 3.87–5.11)
RDW: 12.5 % (ref 11.5–15.5)
WBC: 8.9 10*3/uL (ref 4.0–10.5)
nRBC: 0 % (ref 0.0–0.2)

## 2019-06-21 NOTE — Patient Instructions (Signed)
Your procedure is scheduled on: 06-23-19 THURSDAY Report to Same Day Surgery 2nd floor medical mall [facility] Entrance-take elevator on left to 2nd floor.  Check in with surgery information desk.) To find out your arrival time please call [phone] between 1PM - 3PM on 06-22-19 [facility]  Remember: Instructions that are not followed completely may result in serious medical risk, up to and including death, or upon the discretion of your surgeon and anesthesiologist your surgery may need to be rescheduled.    _x___ 1. Do not eat food after midnight the night before your procedure. NO GUM OR CANDY AFTER MIDNIGHT. You may drink clear liquids up to 2 hours before you are scheduled to arrive at the hospital for your procedure.  Do not drink clear liquids within 2 hours of your scheduled arrival to the hospital.  Clear liquids include  --Water or Apple juice without pulp  --Gatorade  --Black Coffee or Clear Tea (No milk, no creamers, do not add anything to the coffee or Tea)   ____Ensure clear carbohydrate drink on the way to the hospital for bariatric patients  ____Ensure clear carbohydrate drink 3 hours before surgery.    __x__ 2. No Alcohol for 24 hours before or after surgery.   __x__3. No Smoking or e-cigarettes for 24 prior to surgery.  Do not use any chewable tobacco products for at least 6 hour prior to surgery   ____  4. Bring all medications with you on the day of surgery if instructed.    __x__ 5. Notify your doctor if there is any change in your medical condition     (cold, fever, infections).    x___6. On the morning of surgery brush your teeth with toothpaste and water.  You may rinse your mouth with mouth wash if you wish.  Do not swallow any toothpaste or mouthwash.   Do not wear jewelry, make-up, hairpins, clips or nail polish.  Do not wear lotions, powders, or perfumes.   Do not shave 48 hours prior to surgery. Men may shave face and neck.  Do not bring valuables  to the hospital.    [facility] is not responsible for any belongings or valuables.               Contacts, dentures or bridgework may not be worn into surgery.  Leave your suitcase in the car. After surgery it may be brought to your room.  For patients admitted to the hospital, discharge time is determined by your  treatment team.  _  Patients discharged the day of surgery will not be allowed to drive home.  You will need someone to drive you home and stay with you the night of your procedure.    Please read over the following fact sheets that you were given:   [facility] Preparing for Surgery  _x___ TAKE THE FOLLOWING MEDICATION THE MORNING OF SURGERY WITH A SMALL SIP OF WATER. These include:  1. NORVASC (AMLODIPINE)  2. ZYRTEC (CETIRIZINE)  3. LOPID (GEMFIBOZIL)  4. LEVOTHYROXINE (SYNTHROID)  5. METOPROLOL (TOPROL)  6. SINGULAIR (MONTELUKAST)  7. YOU MAY TAKE HYDROCODONE AM OF SURGERY IF NEEDED DAY OF SURGERY ____Fleets enema or Magnesium Citrate as directed.   _x___ Use CHG Soap or sage wipes as directed on instruction sheet   ____ Use inhalers on the day of surgery and bring to hospital day of surgery  ____ Stop Metformin and Janumet 2 days prior to surgery.    ____ Take 1/2 of usual  insulin dose the night before surgery and none on the morning surgery.   _x___ Follow recommendations from Cardiologist, Pulmonologist or PCP regarding stopping Aspirin, Coumadin, Plavix ,Eliquis, Effient, or Pradaxa, and Pletal-STOP ASPIRIN NOW  X____Stop Anti-inflammatories such as Advil, Aleve, Ibuprofen, Motrin, Naproxen, Naprosyn, Goodies powders or aspirin products. OK to take Tylenol OR HYDROCODONE IF NEEDED   ____ Stop supplements until after surgery.     ____ Bring C-Pap to the hospital.

## 2019-06-22 LAB — SARS CORONAVIRUS 2 (TAT 6-24 HRS): SARS Coronavirus 2: NEGATIVE

## 2019-06-22 NOTE — TOC Progression Note (Signed)
Transition of Care [facility]) - Progression Note    Patient Details  Name: [name] MRN: [id number] Date of Birth: 28-Apr-1941  Transition of Care [facility]) CM/SW [place], RN Phone Number: 06/22/2019, 3:39 PM  Clinical Narrative:      Requested the price pf Lovenox, will notify the patient once obtained      Expected Discharge Plan and Services                                                 Social Determinants of Health (SDOH) Interventions    Readmission Risk Interventions No flowsheet data found.

## 2019-06-23 ENCOUNTER — Encounter
Admission: RE | Disposition: A | Discharge status: Home / Self Care | Payer: Self-pay | Source: Home / Self Care | Attending: Orthopedic Surgery

## 2019-06-23 ENCOUNTER — Ambulatory Visit: Payer: PPO | Admitting: Anesthesiology

## 2019-06-23 ENCOUNTER — Other Ambulatory Visit: Payer: Self-pay

## 2019-06-23 ENCOUNTER — Ambulatory Visit
Admission: RE | Admit: 2019-06-23 | Discharge: 2019-06-23 | Disposition: A | Discharge status: Home / Self Care | Payer: PPO | Attending: Orthopedic Surgery | Admitting: Orthopedic Surgery

## 2019-06-23 ENCOUNTER — Encounter: Payer: Self-pay | Admitting: *Deleted

## 2019-06-23 ENCOUNTER — Ambulatory Visit: Payer: PPO

## 2019-06-23 DIAGNOSIS — Z79891 Long term (current) use of opiate analgesic: Secondary | ICD-10-CM | POA: Diagnosis not present

## 2019-06-23 DIAGNOSIS — Z79890 Hormone replacement therapy (postmenopausal): Secondary | ICD-10-CM | POA: Insufficient documentation

## 2019-06-23 DIAGNOSIS — Z8781 Personal history of (healed) traumatic fracture: Secondary | ICD-10-CM

## 2019-06-23 DIAGNOSIS — G4730 Sleep apnea, unspecified: Secondary | ICD-10-CM | POA: Insufficient documentation

## 2019-06-23 DIAGNOSIS — Z85828 Personal history of other malignant neoplasm of skin: Secondary | ICD-10-CM | POA: Insufficient documentation

## 2019-06-23 DIAGNOSIS — Z79899 Other long term (current) drug therapy: Secondary | ICD-10-CM | POA: Diagnosis not present

## 2019-06-23 DIAGNOSIS — F419 Anxiety disorder, unspecified: Secondary | ICD-10-CM | POA: Diagnosis not present

## 2019-06-23 DIAGNOSIS — Z96651 Presence of right artificial knee joint: Secondary | ICD-10-CM | POA: Insufficient documentation

## 2019-06-23 DIAGNOSIS — E785 Hyperlipidemia, unspecified: Secondary | ICD-10-CM | POA: Insufficient documentation

## 2019-06-23 DIAGNOSIS — Z7982 Long term (current) use of aspirin: Secondary | ICD-10-CM | POA: Diagnosis not present

## 2019-06-23 DIAGNOSIS — I10 Essential (primary) hypertension: Secondary | ICD-10-CM | POA: Insufficient documentation

## 2019-06-23 DIAGNOSIS — F17200 Nicotine dependence, unspecified, uncomplicated: Secondary | ICD-10-CM | POA: Insufficient documentation

## 2019-06-23 DIAGNOSIS — S52532A Colles' fracture of left radius, initial encounter for closed fracture: Secondary | ICD-10-CM | POA: Diagnosis not present

## 2019-06-23 DIAGNOSIS — D649 Anemia, unspecified: Secondary | ICD-10-CM | POA: Diagnosis not present

## 2019-06-23 DIAGNOSIS — W19XXXA Unspecified fall, initial encounter: Secondary | ICD-10-CM | POA: Insufficient documentation

## 2019-06-23 DIAGNOSIS — S6292XD Unspecified fracture of left wrist and hand, subsequent encounter for fracture with routine healing: Secondary | ICD-10-CM | POA: Diagnosis not present

## 2019-06-23 DIAGNOSIS — E039 Hypothyroidism, unspecified: Secondary | ICD-10-CM | POA: Diagnosis not present

## 2019-06-23 DIAGNOSIS — Z9889 Other specified postprocedural states: Secondary | ICD-10-CM

## 2019-06-23 HISTORY — PX: OPEN REDUCTION INTERNAL FIXATION (ORIF) DISTAL RADIAL FRACTURE: SHX5989

## 2019-06-23 SURGERY — OPEN REDUCTION INTERNAL FIXATION (ORIF) DISTAL RADIUS FRACTURE
Anesthesia: General | Site: Wrist | Laterality: Left

## 2019-06-23 MED ORDER — KETOROLAC TROMETHAMINE 15 MG/ML IJ SOLN
15.0000 mg | Freq: Once | INTRAMUSCULAR | Status: AC
  Administered 2019-06-23: 12:00:00 15 mg via INTRAVENOUS

## 2019-06-23 MED ORDER — LIDOCAINE HCL (PF) 2 % IJ SOLN
INTRAMUSCULAR | Status: DC | PRN
  Administered 2019-06-23: 50 mg

## 2019-06-23 MED ORDER — FENTANYL CITRATE (PF) 100 MCG/2ML IJ SOLN
INTRAMUSCULAR | Status: DC | PRN
  Administered 2019-06-23 (×4): 50 ug via INTRAVENOUS

## 2019-06-23 MED ORDER — FAMOTIDINE 20 MG PO TABS
ORAL_TABLET | ORAL | Status: AC
  Filled 2019-06-23: qty 1

## 2019-06-23 MED ORDER — ONDANSETRON HCL 4 MG/2ML IJ SOLN: 4.0000 mg | Freq: Four times a day (QID) | INTRAMUSCULAR | Status: DC | PRN

## 2019-06-23 MED ORDER — HYDROCODONE-ACETAMINOPHEN 5-325 MG PO TABS: 1.0000 | ORAL_TABLET | ORAL | Status: DC | PRN

## 2019-06-23 MED ORDER — ACETAMINOPHEN 325 MG PO TABS: 325.0000 mg | ORAL_TABLET | Freq: Four times a day (QID) | ORAL | Status: DC | PRN

## 2019-06-23 MED ORDER — PROPOFOL 10 MG/ML IV BOLUS
INTRAVENOUS | Status: AC
  Filled 2019-06-23: qty 20

## 2019-06-23 MED ORDER — ONDANSETRON HCL 4 MG/2ML IJ SOLN
INTRAMUSCULAR | Status: AC
  Filled 2019-06-23: qty 2

## 2019-06-23 MED ORDER — DEXAMETHASONE SODIUM PHOSPHATE 10 MG/ML IJ SOLN
INTRAMUSCULAR | Status: AC
  Filled 2019-06-23: qty 1

## 2019-06-23 MED ORDER — KETAMINE HCL 50 MG/ML IJ SOLN
INTRAMUSCULAR | Status: DC | PRN
  Administered 2019-06-23: 25 mg via INTRAMUSCULAR

## 2019-06-23 MED ORDER — HYDROMORPHONE HCL 1 MG/ML IJ SOLN
INTRAMUSCULAR | Status: AC
  Administered 2019-06-23: 12:00:00 0.5 mg via INTRAVENOUS
  Filled 2019-06-23: qty 1

## 2019-06-23 MED ORDER — HYDROCODONE-ACETAMINOPHEN 7.5-325 MG PO TABS: 1.0000 | ORAL_TABLET | ORAL | Status: DC | PRN

## 2019-06-23 MED ORDER — HYDRALAZINE HCL 20 MG/ML IJ SOLN
INTRAMUSCULAR | Status: AC
  Administered 2019-06-23: 13:00:00 10 mg via INTRAVENOUS
  Filled 2019-06-23: qty 1

## 2019-06-23 MED ORDER — FENTANYL CITRATE (PF) 100 MCG/2ML IJ SOLN
25.0000 ug | INTRAMUSCULAR | Status: DC | PRN
  Administered 2019-06-23: 12:00:00 50 ug via INTRAVENOUS

## 2019-06-23 MED ORDER — ONDANSETRON HCL 4 MG/2ML IJ SOLN
INTRAMUSCULAR | Status: DC | PRN
  Administered 2019-06-23: 4 mg via INTRAVENOUS

## 2019-06-23 MED ORDER — DEXAMETHASONE SODIUM PHOSPHATE 10 MG/ML IJ SOLN
INTRAMUSCULAR | Status: DC | PRN
  Administered 2019-06-23: 10 mg via INTRAVENOUS

## 2019-06-23 MED ORDER — METOCLOPRAMIDE HCL 5 MG/ML IJ SOLN: 5.0000 mg | Freq: Three times a day (TID) | INTRAMUSCULAR | Status: DC | PRN

## 2019-06-23 MED ORDER — HYDRALAZINE HCL 20 MG/ML IJ SOLN
10.0000 mg | Freq: Once | INTRAMUSCULAR | Status: AC
  Administered 2019-06-23: 13:00:00 10 mg via INTRAVENOUS

## 2019-06-23 MED ORDER — ONDANSETRON HCL 4 MG PO TABS: 4.0000 mg | ORAL_TABLET | Freq: Four times a day (QID) | ORAL | Status: DC | PRN

## 2019-06-23 MED ORDER — GLYCOPYRROLATE 0.2 MG/ML IJ SOLN
INTRAMUSCULAR | Status: DC | PRN
  Administered 2019-06-23: 0.2 mg via INTRAVENOUS

## 2019-06-23 MED ORDER — HYDROCODONE-ACETAMINOPHEN 5-325 MG PO TABS: 1.0000 | ORAL_TABLET | Freq: Four times a day (QID) | ORAL | 0 refills | Status: DC | PRN

## 2019-06-23 MED ORDER — FAMOTIDINE 20 MG PO TABS
20.0000 mg | ORAL_TABLET | Freq: Once | ORAL | Status: AC
  Administered 2019-06-23: 10:00:00 20 mg via ORAL

## 2019-06-23 MED ORDER — NEOMYCIN-POLYMYXIN B GU 40-200000 IR SOLN
Status: DC | PRN
  Administered 2019-06-23: 2 mL

## 2019-06-23 MED ORDER — HYDROMORPHONE HCL 1 MG/ML IJ SOLN
0.5000 mg | INTRAMUSCULAR | Status: AC | PRN
  Administered 2019-06-23 (×4): 0.5 mg via INTRAVENOUS

## 2019-06-23 MED ORDER — MORPHINE SULFATE (PF) 4 MG/ML IV SOLN: 0.5000 mg | INTRAVENOUS | Status: DC | PRN

## 2019-06-23 MED ORDER — FENTANYL CITRATE (PF) 100 MCG/2ML IJ SOLN
INTRAMUSCULAR | Status: AC
  Filled 2019-06-23: qty 2

## 2019-06-23 MED ORDER — OXYCODONE HCL 5 MG/5ML PO SOLN: 5.0000 mg | Freq: Once | ORAL | Status: DC | PRN

## 2019-06-23 MED ORDER — LACTATED RINGERS IV SOLN
INTRAVENOUS | Status: DC
  Administered 2019-06-23: 10:00:00 via INTRAVENOUS

## 2019-06-23 MED ORDER — PROPOFOL 10 MG/ML IV BOLUS
INTRAVENOUS | Status: DC | PRN
  Administered 2019-06-23: 100 mg via INTRAVENOUS

## 2019-06-23 MED ORDER — ACETAMINOPHEN 500 MG PO TABS: 500.0000 mg | ORAL_TABLET | Freq: Four times a day (QID) | ORAL | Status: DC

## 2019-06-23 MED ORDER — ACETAMINOPHEN 10 MG/ML IV SOLN
INTRAVENOUS | Status: AC
  Administered 2019-06-23: 12:00:00 1000 mg via INTRAVENOUS
  Filled 2019-06-23: qty 100

## 2019-06-23 MED ORDER — CEFAZOLIN SODIUM-DEXTROSE 2-4 GM/100ML-% IV SOLN
INTRAVENOUS | Status: AC
  Filled 2019-06-23: qty 100

## 2019-06-23 MED ORDER — GLYCOPYRROLATE 0.2 MG/ML IJ SOLN
INTRAMUSCULAR | Status: AC
  Filled 2019-06-23: qty 1

## 2019-06-23 MED ORDER — METOCLOPRAMIDE HCL 10 MG PO TABS: 5.0000 mg | ORAL_TABLET | Freq: Three times a day (TID) | ORAL | Status: DC | PRN

## 2019-06-23 MED ORDER — KETOROLAC TROMETHAMINE 15 MG/ML IJ SOLN
INTRAMUSCULAR | Status: AC
  Administered 2019-06-23: 15 mg via INTRAVENOUS
  Filled 2019-06-23: qty 1

## 2019-06-23 MED ORDER — ACETAMINOPHEN 10 MG/ML IV SOLN
1000.0000 mg | Freq: Four times a day (QID) | INTRAVENOUS | Status: DC
  Administered 2019-06-23: 12:00:00 1000 mg via INTRAVENOUS

## 2019-06-23 MED ORDER — LIDOCAINE HCL (PF) 2 % IJ SOLN
INTRAMUSCULAR | Status: AC
  Filled 2019-06-23: qty 10

## 2019-06-23 MED ORDER — SODIUM CHLORIDE 0.9 % IV SOLN: INTRAVENOUS | Status: DC

## 2019-06-23 MED ORDER — OXYCODONE HCL 5 MG PO TABS: 5.0000 mg | ORAL_TABLET | Freq: Once | ORAL | Status: DC | PRN

## 2019-06-23 MED ORDER — FENTANYL CITRATE (PF) 100 MCG/2ML IJ SOLN
INTRAMUSCULAR | Status: AC
  Administered 2019-06-23: 12:00:00 50 ug via INTRAVENOUS
  Filled 2019-06-23: qty 2

## 2019-06-23 MED ORDER — CEFAZOLIN SODIUM-DEXTROSE 2-4 GM/100ML-% IV SOLN
2.0000 g | Freq: Once | INTRAVENOUS | Status: AC
  Administered 2019-06-23: 2 g via INTRAVENOUS

## 2019-06-23 SURGICAL SUPPLY — 44 items
APL PRP STRL LF DISP 70% ISPRP (MISCELLANEOUS) ×1
BIT DRILL 2 FAST STEP (BIT) ×1 IMPLANT
BIT DRILL 2.5X4 QC (BIT) ×1 IMPLANT
BNDG ELASTIC 4X5.8 VLCR STR LF (GAUZE/BANDAGES/DRESSINGS) ×2 IMPLANT
CANISTER SUCT 1200ML W/VALVE (MISCELLANEOUS) ×2 IMPLANT
CHLORAPREP W/TINT 26 (MISCELLANEOUS) ×2 IMPLANT
COVER WAND RF STERILE (DRAPES) ×2 IMPLANT
CUFF TOURN SGL QUICK 18X4 (TOURNIQUET CUFF) ×1 IMPLANT
DRAPE FLUOR MINI C-ARM 54X84 (DRAPES) ×2 IMPLANT
ELECT REM PT RETURN 9FT ADLT (ELECTROSURGICAL) ×2
ELECTRODE REM PT RTRN 9FT ADLT (ELECTROSURGICAL) ×1 IMPLANT
GAUZE SPONGE 4X4 12PLY STRL (GAUZE/BANDAGES/DRESSINGS) ×2 IMPLANT
GAUZE XEROFORM 1X8 LF (GAUZE/BANDAGES/DRESSINGS) ×4 IMPLANT
GLOVE SURG SYN 9.0  PF PI (GLOVE) ×1
GLOVE SURG SYN 9.0 PF PI (GLOVE) ×1 IMPLANT
GOWN SRG 2XL LVL 4 RGLN SLV (GOWNS) ×1 IMPLANT
GOWN STRL NON-REIN 2XL LVL4 (GOWNS) ×2
GOWN STRL REUS W/ TWL LRG LVL3 (GOWN DISPOSABLE) ×1 IMPLANT
GOWN STRL REUS W/TWL LRG LVL3 (GOWN DISPOSABLE) ×2
K-WIRE 1.6 (WIRE) ×4
K-WIRE FX5X1.6XNS BN SS (WIRE) ×2
KIT TURNOVER KIT A (KITS) ×2 IMPLANT
KWIRE FX5X1.6XNS BN SS (WIRE) IMPLANT
NDL FILTER BLUNT 18X1 1/2 (NEEDLE) ×1 IMPLANT
NEEDLE FILTER BLUNT 18X 1/2SAF (NEEDLE) ×1
NEEDLE FILTER BLUNT 18X1 1/2 (NEEDLE) ×1 IMPLANT
NS IRRIG 500ML POUR BTL (IV SOLUTION) ×2 IMPLANT
PACK EXTREMITY ARMC (MISCELLANEOUS) ×2 IMPLANT
PAD CAST CTTN 4X4 STRL (SOFTGOODS) ×2 IMPLANT
PADDING CAST COTTON 4X4 STRL (SOFTGOODS) ×4
PEG SUBCHONDRAL SMOOTH 2.0X14 (Peg) ×1 IMPLANT
PEG SUBCHONDRAL SMOOTH 2.0X18 (Peg) ×1 IMPLANT
PEG SUBCHONDRAL SMOOTH 2.0X20 (Peg) ×1 IMPLANT
PEG SUBCHONDRAL SMOOTH 2.0X22 (Peg) ×2 IMPLANT
PEG SUBCHONDRAL SMOOTH 2.0X24 (Peg) ×1 IMPLANT
PLATE SHORT 21.6X48.9 NRRW LT (Plate) ×1 IMPLANT
SCALPEL PROTECTED #15 DISP (BLADE) ×4 IMPLANT
SCREW CORT 3.5X10 LNG (Screw) ×3 IMPLANT
SPLINT CAST 1 STEP 3X12 (MISCELLANEOUS) ×2 IMPLANT
SUT ETHILON 4-0 (SUTURE) ×2
SUT ETHILON 4-0 FS2 18XMFL BLK (SUTURE) ×1
SUT VICRYL 3-0 27IN (SUTURE) ×2 IMPLANT
SUTURE ETHLN 4-0 FS2 18XMF BLK (SUTURE) ×1 IMPLANT
SYR 3ML LL SCALE MARK (SYRINGE) ×2 IMPLANT

## 2019-06-23 NOTE — Discharge Instructions (Addendum)
Work on finger motion is much as possible.  Keep arm elevated is much as possible.  Ice to the back of the wrist today and tomorrow may help with swelling and pain.  If fingers swell a great deal loosen Ace wrap but leave splint and cotton roll in place.  Pain medicine as directed   AMBULATORY SURGERY  DISCHARGE INSTRUCTIONS   1) The drugs that you were given will stay in your system until tomorrow so for the next 24 hours you should not:  A) Drive an automobile B) Make any legal decisions C) Drink any alcoholic beverage   2) You may resume regular meals tomorrow.  Today it is better to start with liquids and gradually work up to solid foods.  You may eat anything you prefer, but it is better to start with liquids, then soup and crackers, and gradually work up to solid foods.   3) Please notify your doctor immediately if you have any unusual bleeding, trouble breathing, redness and pain at the surgery site, drainage, fever, or pain not relieved by medication.    4) Additional Instructions:        Please contact your physician with any problems or Same Day Surgery at [phone], Monday through Friday 6 am to 4 pm, or [place] at [facility] number at [phone].

## 2019-06-23 NOTE — Anesthesia Post-op Follow-up Note (Signed)
Anesthesia QCDR form completed.        

## 2019-06-23 NOTE — H&P (Signed)
Reviewed paper H+P, will be scanned into chart. No changes noted.  

## 2019-06-23 NOTE — Progress Notes (Signed)
Pt in severe pain. 10/10. Dr. Amie Critchley notified. Acknowledged. Orders received.

## 2019-06-23 NOTE — Anesthesia Postprocedure Evaluation (Signed)
Anesthesia Post Note  Patient: [name]  Procedure(s) Performed: OPEN REDUCTION INTERNAL FIXATION (ORIF) DISTAL RADIAL FRACTURE (Left Wrist)  Patient location during evaluation: PACU Anesthesia Type: General Level of consciousness: awake and alert Pain management: pain level controlled Vital Signs Assessment: post-procedure vital signs reviewed and stable Respiratory status: spontaneous breathing, nonlabored ventilation, respiratory function stable and patient connected to nasal cannula oxygen Cardiovascular status: blood pressure returned to baseline and stable Postop Assessment: no apparent nausea or vomiting Anesthetic complications: no     Last Vitals:  Vitals:   06/23/19 1306 06/23/19 1319  BP: 129/63 125/71  Pulse: 74 80  Resp: 18 18  Temp: 37.1 C   SpO2: 96% 95%    Last Pain:  Vitals:   06/23/19 1319  TempSrc:   PainSc: 5                  Precious Haws [place]

## 2019-06-23 NOTE — Anesthesia Preprocedure Evaluation (Signed)
Anesthesia Evaluation  Patient identified by MRN, date of birth, ID band Patient awake    Reviewed: Allergy & Precautions, H&P , NPO status , Patient's Chart, lab work & pertinent test results  History of Anesthesia Complications Negative for: history of anesthetic complications  Airway Mallampati: II  TM Distance: >3 FB Neck ROM: full    Dental  (+) Missing, Edentulous Lower, Edentulous Upper   Pulmonary neg shortness of breath, sleep apnea and Continuous Positive Airway Pressure Ventilation , Current Smoker,           Cardiovascular Exercise Tolerance: Good hypertension, (-) angina(-) Past MI and (-) DOE      Neuro/Psych PSYCHIATRIC DISORDERS  Neuromuscular disease    GI/Hepatic negative GI ROS, Neg liver ROS, neg GERD  ,  Endo/Other  negative endocrine ROSHypothyroidism   Renal/GU      Musculoskeletal  (+) Arthritis ,   Abdominal   Peds  Hematology negative hematology ROS (+)   Anesthesia Other Findings Past Medical History: No date: Allergy No date: Anemia No date: Anxiety No date: Cancer ([place])     Comment:  skin cancer No date: Hyperlipidemia No date: Hypertension No date: Hypothyroidism No date: Sciatica No date: Sleep apnea     Comment:  does not use cpap No date: Thyroid disease  Past Surgical History: No date: ABDOMINAL HYSTERECTOMY No date: APPENDECTOMY No date: BACK SURGERY     Comment:  lower No date: BILATERAL CARPAL TUNNEL RELEASE No date: BREAST CYST ASPIRATION; Left     Comment:  neg No date: COLONOSCOPY No date: JOINT REPLACEMENT No date: KNEE SURGERY; Bilateral No date: KNEE SURGERY 08/13/2015: TOTAL KNEE REVISION; Right     Comment:  Procedure: TOTAL KNEE REVISION;  Surgeon: Paralee Cancel,               MD;  Location: [facility];  Service: Orthopedics;                Laterality: Right;  BMI    Body Mass Index: 31.66 kg/m      Reproductive/Obstetrics negative OB ROS                              Anesthesia Physical Anesthesia Plan  ASA: III  Anesthesia Plan: General LMA   Post-op Pain Management:    Induction: Intravenous  PONV Risk Score and Plan: Dexamethasone, Ondansetron, Midazolam and Treatment may vary due to age or medical condition  Airway Management Planned: LMA  Additional Equipment:   Intra-op Plan:   Post-operative Plan: Extubation in OR  Informed Consent: I have reviewed the patients History and Physical, chart, labs and discussed the procedure including the risks, benefits and alternatives for the proposed anesthesia with the patient or authorized representative who has indicated his/her understanding and acceptance.     Dental Advisory Given  Plan Discussed with: Anesthesiologist, CRNA and Surgeon  Anesthesia Plan Comments: (Patient consented for risks of anesthesia including but not limited to:  - adverse reactions to medications - damage to teeth, lips or other oral mucosa - sore throat or hoarseness - Damage to heart, brain, lungs or loss of life  Patient voiced understanding.)        Anesthesia Quick Evaluation

## 2019-06-23 NOTE — Transfer of Care (Signed)
Immediate Anesthesia Transfer of Care Note  Patient: [name]  Procedure(s) Performed: OPEN REDUCTION INTERNAL FIXATION (ORIF) DISTAL RADIAL FRACTURE (Left Wrist)  Patient Location: PACU  Anesthesia Type:General  Level of Consciousness: awake, alert  and oriented  Airway & Oxygen Therapy: Patient Spontanous Breathing and Patient connected to face mask oxygen  Post-op Assessment: Report given to RN and Post -op Vital signs reviewed and stable  Post vital signs: Reviewed and stable  Last Vitals:  Vitals Value Taken Time  BP 154/111 06/23/19 1139  Temp    Pulse 77 06/23/19 1139  Resp 12 06/23/19 1139  SpO2 100 % 06/23/19 1139    Last Pain:  Vitals:   06/23/19 1135  TempSrc:   PainSc: (P) 8          Complications: No apparent anesthesia complications

## 2019-06-23 NOTE — Op Note (Signed)
06/23/2019  11:30 AM  PATIENT:  [name]  78 y.o. female  PRE-OPERATIVE DIAGNOSIS:  CLOSED COLLES FRACTURE OF DISTAL END OF LEFT RADIUS  POST-OPERATIVE DIAGNOSIS:  CLOSED COLLES FRACTURE OF DISTAL END OF LEFT   PROCEDURE:  Procedure(s): OPEN REDUCTION INTERNAL FIXATION (ORIF) DISTAL RADIAL FRACTURE (Left)  SURGEON: Laurene Footman, MD  ASSISTANTS: None  ANESTHESIA:   general  EBL:  Total I/O In: 500 [I.V.:500] Out: 10 [Blood:10]  BLOOD ADMINISTERED:none  DRAINS: none   LOCAL MEDICATIONS USED:  NONE  SPECIMEN:  No Specimen  DISPOSITION OF SPECIMEN:  N/A  COUNTS:  YES  TOURNIQUET:   Total Tourniquet Time Documented: Upper Arm (Left) - 24 minutes Total: Upper Arm (Left) - 24 minutes   IMPLANTS: [employer] hand [employer] DVR plate standard narrow with multiple smooth pegs and screws  DICTATION: .Dragon Dictation  patient was brought to the operating room and after adequate general anesthesia was obtained the left arm was prepped and draped in the usual sterile fashion. After patient identification and timeout procedures were completed, tourniquet was raised to 250 mmHg. A volar approach was made centered over the FCR tendon. The tendon sheath was incised and the tendon retracted radially protecting the radial artery and associated veins. The deep fascia was incised and muscles were retracted with a pronator elevated off the distal fragment and shaft. At the start of the case fingertrap traction was applied off the end of the bed and this helped restore length but dorsal displacement was not really restored with use of a Valora Corporal that was in disimpacting the fracture this was also improved. Distal first technique was utilized with the plate applied to the distal fragment and pinned into position appropriately placed all 6 peg holes filled using standard technique drilling and placing the smooth pegs. The plate was then brought down to the shaft and screws inserted in  the shaft 310 mm in length followed by removal of traction to make sure there is not over distraction. The 2 remaining screws were screws were filled with 10 mm screws with the most proximal screw will not fill as it is did not seem to be required and tourniquet let down the wound was irrigated with saline and mini C arm views reviewed and showing good position without penetration into the joint on oblique views of the distal radius. The wound was then closed with 3-0 Vicryl subcutaneously and 4-0 nylon in a simple erupted fashion with Xeroform 4 x 4 web roll volar splint and Ace wrap applied  PLAN OF CARE: Discharge to home after PACU  PATIENT DISPOSITION:  PACU - hemodynamically stable.

## 2019-06-23 NOTE — Anesthesia Procedure Notes (Signed)
Procedure Name: LMA Insertion Performed by: Huntley Demedeiros, CRNA Pre-anesthesia Checklist: Patient identified, Patient being monitored, Timeout performed, Emergency Drugs available and Suction available Patient Re-evaluated:Patient Re-evaluated prior to induction Oxygen Delivery Method: Circle system utilized Preoxygenation: Pre-oxygenation with 100% oxygen Induction Type: IV induction Ventilation: Mask ventilation without difficulty LMA: LMA inserted LMA Size: 3.5 Tube type: Oral Number of attempts: 1 Placement Confirmation: positive ETCO2 and breath sounds checked- equal and bilateral Tube secured with: Tape Dental Injury: Teeth and Oropharynx as per pre-operative assessment        

## 2019-06-23 NOTE — TOC Benefit Eligibility Note (Addendum)
Transition of Care [facility]) Benefit Eligibility Note    Patient Details  Name: KATALEYA WATCHMAN MRN: [id number] Date of Birth: [birth year]   Medication/Dose: ENOXAPARIN  40 MG DAILY X 14 SYRINGES  Covered?: Yes  Tier: (TIER- 4 DRUG)  Prescription Coverage Preferred Pharmacy: [facility] with Person/Company/Phone Number:: LIONEL  @  [employer] [id number] #  [phone] OPT-2  Co-Pay: $90.00  Prior Approval: No  Deductible: (NO DEDUCTIBLE)  Additional Notes: LOVENOX  40 MG DAILY  NOT COVER , P/A # [id number]    Memory Argue Phone Number: 06/23/2019, 9:52 AM

## 2019-06-24 ENCOUNTER — Encounter: Payer: Self-pay | Admitting: Orthopedic Surgery

## 2019-06-27 DIAGNOSIS — Z9889 Other specified postprocedural states: Secondary | ICD-10-CM | POA: Diagnosis not present

## 2019-06-27 DIAGNOSIS — Z8781 Personal history of (healed) traumatic fracture: Secondary | ICD-10-CM | POA: Diagnosis not present

## 2019-07-01 ENCOUNTER — Other Ambulatory Visit: Payer: Self-pay | Admitting: Nurse Practitioner

## 2019-07-01 DIAGNOSIS — F419 Anxiety disorder, unspecified: Secondary | ICD-10-CM

## 2019-07-01 DIAGNOSIS — F324 Major depressive disorder, single episode, in partial remission: Secondary | ICD-10-CM

## 2019-07-07 ENCOUNTER — Other Ambulatory Visit: Payer: Self-pay | Admitting: Nurse Practitioner

## 2019-07-07 DIAGNOSIS — J302 Other seasonal allergic rhinitis: Secondary | ICD-10-CM

## 2019-07-08 DIAGNOSIS — S52532D Colles' fracture of left radius, subsequent encounter for closed fracture with routine healing: Secondary | ICD-10-CM | POA: Diagnosis not present

## 2019-07-29 DIAGNOSIS — S52532D Colles' fracture of left radius, subsequent encounter for closed fracture with routine healing: Secondary | ICD-10-CM | POA: Diagnosis not present

## 2019-08-03 DIAGNOSIS — C44519 Basal cell carcinoma of skin of other part of trunk: Secondary | ICD-10-CM | POA: Diagnosis not present

## 2019-08-26 DIAGNOSIS — S52532D Colles' fracture of left radius, subsequent encounter for closed fracture with routine healing: Secondary | ICD-10-CM | POA: Diagnosis not present

## 2019-08-30 ENCOUNTER — Other Ambulatory Visit: Payer: Self-pay | Admitting: Family Medicine

## 2019-08-30 DIAGNOSIS — Z1231 Encounter for screening mammogram for malignant neoplasm of breast: Secondary | ICD-10-CM

## 2019-09-28 ENCOUNTER — Other Ambulatory Visit: Payer: Self-pay | Admitting: Nurse Practitioner

## 2019-09-28 DIAGNOSIS — G2581 Restless legs syndrome: Secondary | ICD-10-CM

## 2019-10-12 ENCOUNTER — Encounter: Payer: Self-pay | Admitting: Family Medicine

## 2019-10-12 ENCOUNTER — Other Ambulatory Visit: Payer: Self-pay

## 2019-10-12 ENCOUNTER — Ambulatory Visit (INDEPENDENT_AMBULATORY_CARE_PROVIDER_SITE_OTHER): Payer: PPO | Admitting: Family Medicine

## 2019-10-12 VITALS — BP 146/65 | HR 67 | Temp 98.7°F | Ht 61.0 in | Wt 161.6 lb

## 2019-10-12 DIAGNOSIS — E039 Hypothyroidism, unspecified: Secondary | ICD-10-CM | POA: Diagnosis not present

## 2019-10-12 DIAGNOSIS — H8111 Benign paroxysmal vertigo, right ear: Secondary | ICD-10-CM | POA: Diagnosis not present

## 2019-10-12 DIAGNOSIS — I10 Essential (primary) hypertension: Secondary | ICD-10-CM | POA: Diagnosis not present

## 2019-10-12 NOTE — Progress Notes (Signed)
Subjective:    Patient ID: [name], female    DOB: 12-09-1940, 79 y.o.   MRN: [id number]  [name] is a 79 y.o. female presenting on 10/12/2019 for Dizziness (w/ sudden movement to the left makes her dizzy. The patient seems to think her thyroid levels are abnormal. )   HPI  BPPV Vertigo / Hypothyroidism Recent problem with worsening dizziness with sudden movements or when getting up and moving to left as reported. She has no recent cause for this, no sinus infection or ear pain or pressure. She has not had vertigo before She has history of hypothyroidism, asks to be checked on blood test for this. Last TSH trend 07/2018 (5.94) > 03/2019 (0.57) Denies any focal weakness numbness tingling, confusion,   Additional history Prior Left radial upper ext fracture repair 06/2019, she has still had pain from this affecting her on L side even in shoulder due to difficulties with moving it at times. She was discharged by orthopedic from post op care.    Depression screen [facility] 2/9 10/12/2019 06/06/2019 03/15/2019  Decreased Interest 1 0 0  Down, Depressed, Hopeless 1 0 0  PHQ - 2 Score 2 0 0  Altered sleeping 0 - -  Tired, decreased energy 3 - -  Change in appetite 0 - -  Feeling bad or failure about yourself  0 - -  Trouble concentrating 0 - -  Moving slowly or fidgety/restless 0 - -  Suicidal thoughts 0 - -  PHQ-9 Score 5 - -  Difficult doing work/chores Not difficult at all - -    Social History   Tobacco Use  . Smoking status: Current Every Day Smoker    Packs/day: 0.50    Years: 40.00    Pack years: 20.00    Types: Cigarettes  . Smokeless tobacco: Never Used  Substance Use Topics  . Alcohol use: No  . Drug use: No    Review of Systems Per HPI unless specifically indicated above     Objective:    BP (!) 146/65   Pulse 67   Temp 98.7 F (37.1 C) (Oral)   Ht 5\' 1"  (1.549 m)   Wt 161 lb 9.6 oz (73.3 kg)   LMP  (LMP Unknown)   BMI 30.53 kg/m   Wt  Readings from Last 3 Encounters:  10/12/19 161 lb 9.6 oz (73.3 kg)  06/23/19 167 lb 8.8 oz (76 kg)  06/21/19 167 lb 8.8 oz (76 kg)    Physical Exam Vitals and nursing note reviewed.  Constitutional:      General: She is not in acute distress.    Appearance: She is well-developed. She is not diaphoretic.     Comments: Well-appearing, comfortable, cooperative  HENT:     Head: Normocephalic and atraumatic.     Comments: Scientist, forensic positive reproduced vertigo on R side, some on left    Right Ear: Ear canal and external ear normal.     Left Ear: Ear canal and external ear normal.     Ears:     Comments: Some TM fullness with mild effusion Eyes:     General:        Right eye: No discharge.        Left eye: No discharge.     Conjunctiva/sclera: Conjunctivae normal.  Cardiovascular:     Rate and Rhythm: Normal rate.  Pulmonary:     Effort: Pulmonary effort is normal.  Skin:    General:  Skin is warm and dry.     Findings: No erythema or rash.  Neurological:     Mental Status: She is alert and oriented to person, place, and time.  Psychiatric:        Behavior: Behavior normal.     Comments: Well groomed, good eye contact, normal speech and thoughts    Results for orders placed or performed during the hospital encounter of 06/21/19  SARS CORONAVIRUS 2 (TAT 6-24 HRS) Nasopharyngeal Nasopharyngeal Swab   Specimen: Nasopharyngeal Swab  Result Value Ref Range   SARS Coronavirus 2 NEGATIVE NEGATIVE  CBC  Result Value Ref Range   WBC 8.9 4.0 - 10.5 K/uL   RBC 4.58 3.87 - 5.11 MIL/uL   Hemoglobin 14.3 12.0 - 15.0 g/dL   HCT 42.9 36.0 - 46.0 %   MCV 93.7 80.0 - 100.0 fL   MCH 31.2 26.0 - 34.0 pg   MCHC 33.3 30.0 - 36.0 g/dL   RDW 12.5 11.5 - 15.5 %   Platelets 149 (L) 150 - 400 K/uL   nRBC 0.0 0.0 - 0.2 %  Basic metabolic panel  Result Value Ref Range   Sodium 140 135 - 145 mmol/L   Potassium 3.6 3.5 - 5.1 mmol/L   Chloride 106 98 - 111 mmol/L   CO2 25 22 - 32  mmol/L   Glucose, Bld 119 (H) 70 - 99 mg/dL   BUN 19 8 - 23 mg/dL   Creatinine, Ser 0.78 0.44 - 1.00 mg/dL   Calcium 11.0 (H) 8.9 - 10.3 mg/dL   GFR calc non Af Amer >60 >60 mL/min   GFR calc Af Amer >60 >60 mL/min   Anion gap 9 5 - 15      Assessment & Plan:   Problem List Items Addressed This Visit    Hypertension   Relevant Orders   BASIC METABOLIC PANEL WITH GFR   Acquired hypothyroidism - Primary   Relevant Orders   TSH   T4, free    Other Visit Diagnoses    BPPV (benign paroxysmal positional vertigo), right          Suspected R > L-BPPV by history supported by positive R Dix-Hallpike - No other significant neurological findings or focal deficits  Plan: 1. Handout given with Epley maneuver TID for 1-2 weeks until resolved 2. Offer OTC dramamine or meclizine PRN for breakthrough symptoms 3. Return criteria, if not improved consider vestibular PT referral  Check labs BMET / Thyroid panel as requested. Less likely cause of her symptoms.  No orders of the defined types were placed in this encounter.     Follow up plan: Return if symptoms worsen or fail to improve, for vertigo.   Nobie Putnam, [facility] 10/12/2019, 8:50 AM

## 2019-10-12 NOTE — Patient Instructions (Addendum)
Thank you for coming to the office today.  1. You have symptoms of Vertigo (Benign Paroxysmal Positional Vertigo) - This is commonly caused by inner ear fluid imbalance, sometimes can be worsened by allergies and sinus symptoms, otherwise it can occur randomly sometimes and we may never discover the exact cause. - To treat this, try the Epley Manuever (see diagrams/instructions below) at home up to 3 times a day for 1-2 weeks or until symptoms resolve - You may take Meclizine / Dramamine (MOtion Sickness) as needed up to 3 times a day for dizziness, this will not cure symptoms but may help. Caution may make you drowsy.  If you develop significant worsening episode with vertigo that does not improve and you get severe headache, loss of vision, arm or leg weakness, slurred speech, or other concerning symptoms please seek immediate medical attention at Emergency Department.  Please schedule a follow-up appointment with Dr Parks Ranger within 4 weeks if Vertigo not improving, and will consider Referral to Vestibular Rehab  See the next page for images describing the Epley Manuever.     ----------------------------------------------------------------------------------------------------------------------       Please schedule a Follow-up Appointment to: Return if symptoms worsen or fail to improve, for vertigo.  If you have any other questions or concerns, please feel free to call the office or send a message through [place]. You may also schedule an earlier appointment if necessary.  Additionally, you may be receiving a survey about your experience at our office within a few days to 1 week by e-mail or mail. We value your feedback.  Nobie Putnam, DO [place]

## 2019-10-13 LAB — TSH: TSH: 0.09 mIU/L — ABNORMAL LOW (ref 0.40–4.50)

## 2019-10-13 LAB — BASIC METABOLIC PANEL WITH GFR
BUN: 15 mg/dL (ref 7–25)
CO2: 26 mmol/L (ref 20–32)
Calcium: 11.7 mg/dL — ABNORMAL HIGH (ref 8.6–10.4)
Chloride: 109 mmol/L (ref 98–110)
Creat: 0.75 mg/dL (ref 0.60–0.93)
GFR, Est African American: 89 mL/min/{1.73_m2} (ref >=60)
GFR, Est Non African American: 77 mL/min/{1.73_m2} (ref >=60)
Glucose, Bld: 94 mg/dL (ref 65–139)
Potassium: 4.3 mmol/L (ref 3.5–5.3)
Sodium: 142 mmol/L (ref 135–146)

## 2019-10-13 LAB — T4, FREE: Free T4: 1.5 ng/dL (ref 0.8–1.8)

## 2019-10-16 ENCOUNTER — Other Ambulatory Visit: Payer: Self-pay | Admitting: Nurse Practitioner

## 2019-10-16 DIAGNOSIS — I10 Essential (primary) hypertension: Secondary | ICD-10-CM

## 2019-10-20 ENCOUNTER — Other Ambulatory Visit: Payer: Self-pay | Admitting: Family Medicine

## 2019-10-20 DIAGNOSIS — E039 Hypothyroidism, unspecified: Secondary | ICD-10-CM

## 2019-10-20 MED ORDER — LEVOTHYROXINE SODIUM 125 MCG PO TABS: 125.0000 ug | ORAL_TABLET | Freq: Every day | ORAL | 3 refills | Status: DC

## 2019-10-20 NOTE — Telephone Encounter (Signed)
As per her lab result requested new Rx for levothyroxine for 125 mcg from 137 mcg to [facility] at [place].

## 2019-11-08 ENCOUNTER — Ambulatory Visit
Admission: RE | Admit: 2019-11-08 | Discharge: 2019-11-08 | Disposition: A | Discharge status: Home / Self Care | Payer: PPO | Source: Ambulatory Visit | Attending: Family Medicine | Admitting: Family Medicine

## 2019-11-08 DIAGNOSIS — Z1231 Encounter for screening mammogram for malignant neoplasm of breast: Secondary | ICD-10-CM

## 2019-11-09 ENCOUNTER — Other Ambulatory Visit: Payer: Self-pay

## 2019-11-09 DIAGNOSIS — E039 Hypothyroidism, unspecified: Secondary | ICD-10-CM

## 2019-11-09 DIAGNOSIS — I10 Essential (primary) hypertension: Secondary | ICD-10-CM

## 2019-11-09 MED ORDER — LEVOTHYROXINE SODIUM 125 MCG PO TABS: 125.0000 ug | ORAL_TABLET | Freq: Every day | ORAL | 0 refills | Status: DC

## 2019-11-09 MED ORDER — METOPROLOL SUCCINATE ER 50 MG PO TB24: 50.0000 mg | ORAL_TABLET | ORAL | 0 refills | Status: DC

## 2019-11-11 MED ORDER — METOPROLOL SUCCINATE ER 50 MG PO TB24: 50.0000 mg | ORAL_TABLET | ORAL | 0 refills | Status: DC

## 2019-11-15 DIAGNOSIS — H02005 Unspecified entropion of left lower eyelid: Secondary | ICD-10-CM | POA: Diagnosis not present

## 2019-11-21 ENCOUNTER — Other Ambulatory Visit: Payer: Self-pay

## 2019-11-21 DIAGNOSIS — J302 Other seasonal allergic rhinitis: Secondary | ICD-10-CM

## 2019-11-21 MED ORDER — CETIRIZINE HCL 10 MG PO TABS: 10.0000 mg | ORAL_TABLET | Freq: Every day | ORAL | 1 refills | Status: DC

## 2019-11-25 ENCOUNTER — Ambulatory Visit: Payer: PPO | Admitting: Family Medicine

## 2019-11-28 ENCOUNTER — Ambulatory Visit: Payer: PPO | Admitting: Family Medicine

## 2019-12-01 ENCOUNTER — Encounter: Payer: Self-pay | Admitting: Family Medicine

## 2019-12-01 ENCOUNTER — Ambulatory Visit (INDEPENDENT_AMBULATORY_CARE_PROVIDER_SITE_OTHER): Payer: PPO | Admitting: Family Medicine

## 2019-12-01 ENCOUNTER — Other Ambulatory Visit: Payer: Self-pay

## 2019-12-01 VITALS — BP 141/58 | HR 56 | Temp 97.1°F | Ht 61.0 in | Wt 165.0 lb

## 2019-12-01 DIAGNOSIS — M75102 Unspecified rotator cuff tear or rupture of left shoulder, not specified as traumatic: Secondary | ICD-10-CM | POA: Insufficient documentation

## 2019-12-01 DIAGNOSIS — I10 Essential (primary) hypertension: Secondary | ICD-10-CM | POA: Diagnosis not present

## 2019-12-01 DIAGNOSIS — S46912A Strain of unspecified muscle, fascia and tendon at shoulder and upper arm level, left arm, initial encounter: Secondary | ICD-10-CM | POA: Diagnosis not present

## 2019-12-01 LAB — POCT URINALYSIS DIPSTICK
Bilirubin, UA: NEGATIVE
Blood, UA: NEGATIVE
Glucose, UA: NEGATIVE
Ketones, UA: NEGATIVE
Leukocytes, UA: NEGATIVE
Nitrite, UA: NEGATIVE
Protein, UA: NEGATIVE
Spec Grav, UA: 1.02 (ref 1.010–1.025)
Urobilinogen, UA: 0.2 E.U./dL
pH, UA: 5 (ref 5.0–8.0)

## 2019-12-01 MED ORDER — BACLOFEN 10 MG PO TABS: 10.0000 mg | ORAL_TABLET | Freq: Three times a day (TID) | ORAL | 0 refills | Status: DC

## 2019-12-01 NOTE — Progress Notes (Signed)
Subjective:    Patient ID: [name], female    DOB: 05-16-1941, 79 y.o.   MRN: [id number]  [name] is a 79 y.o. female presenting on 12/01/2019 for Arm Pain (left arm pain with difficutly with ROM, sorness in the left side of the neck. Pt complains of a pulling situation when she turns her neck.x 1.5 mth The pt state she is concern she could have had a mini stroke on Jan 20th. She was walking into the kitchen and got extremely dizzy for a few minutes and it subsided. She state later that day she notice discomfort with her left arm , shoulder and neck. )   HPI  Health Maintenance:  Depression screen [facility] 2/9 10/12/2019 06/06/2019 03/15/2019  Decreased Interest 1 0 0  Down, Depressed, Hopeless 1 0 0  PHQ - 2 Score 2 0 0  Altered sleeping 0 - -  Tired, decreased energy 3 - -  Change in appetite 0 - -  Feeling bad or failure about yourself  0 - -  Trouble concentrating 0 - -  Moving slowly or fidgety/restless 0 - -  Suicidal thoughts 0 - -  PHQ-9 Score 5 - -  Difficult doing work/chores Not difficult at all - -    Social History   Tobacco Use  . Smoking status: Current Every Day Smoker    Packs/day: 0.50    Years: 40.00    Pack years: 20.00    Types: Cigarettes  . Smokeless tobacco: Never Used  Substance Use Topics  . Alcohol use: No  . Drug use: No    Review of Systems  Constitutional: Negative.   HENT: Negative.   Eyes: Negative.   Respiratory: Negative.   Cardiovascular: Negative.   Gastrointestinal: Negative.   Endocrine: Negative.   Genitourinary: Negative.   Musculoskeletal: Positive for arthralgias and myalgias. Negative for back pain, gait problem, joint swelling, neck pain and neck stiffness.  Skin: Negative.   Allergic/Immunologic: Negative.   Neurological: Negative.   Hematological: Negative.   Psychiatric/Behavioral: Negative.    Per HPI unless specifically indicated above     Objective:    BP (!) 141/58 (BP Location: Left Arm,  Patient Position: Sitting, Cuff Size: Normal)   Pulse (!) 56   Temp (!) 97.1 F (36.2 C) (Temporal)   Ht 5\' 1"  (1.549 m)   Wt 165 lb (74.8 kg)   LMP  (LMP Unknown)   BMI 31.18 kg/m   Wt Readings from Last 3 Encounters:  12/01/19 165 lb (74.8 kg)  10/12/19 161 lb 9.6 oz (73.3 kg)  06/23/19 167 lb 8.8 oz (76 kg)    Physical Exam Vitals reviewed.  Constitutional:      General: She is not in acute distress.    Appearance: Normal appearance. She is well-groomed. She is obese. She is not ill-appearing or toxic-appearing.  HENT:     Head: Normocephalic.  Eyes:     General: Lids are normal. Vision grossly intact.        Right eye: No discharge.        Left eye: No discharge.     Extraocular Movements: Extraocular movements intact.     Conjunctiva/sclera: Conjunctivae normal.     Pupils: Pupils are equal, round, and reactive to light.  Cardiovascular:     Rate and Rhythm: Normal rate and regular rhythm.     Pulses: Normal pulses.     Heart sounds: Normal heart sounds. No murmur. No friction rub. No gallop.  Pulmonary:     Effort: Pulmonary effort is normal. No respiratory distress.     Breath sounds: Normal breath sounds.  Musculoskeletal:        General: Tenderness and signs of injury present. No swelling or deformity.     Right shoulder: Normal.     Left shoulder: Tenderness present. No swelling, deformity, effusion or laceration. Decreased range of motion. Normal strength. Normal pulse.     Cervical back: Normal range of motion and neck supple. No rigidity or tenderness.     Right lower leg: No edema.     Left lower leg: No edema.     Comments: 5/5 strength in left upper extremity.  Tenderness along rotator cuff with palpation and tightness/spasm in left trapezius.  Skin:    General: Skin is warm and dry.     Capillary Refill: Capillary refill takes less than 2 seconds.  Neurological:     General: No focal deficit present.     Mental Status: She is alert and oriented to  person, place, and time.     Cranial Nerves: No cranial nerve deficit.     Sensory: No sensory deficit.     Motor: No weakness.     Coordination: Coordination normal.     Gait: Gait normal.  Psychiatric:        Attention and Perception: Attention and perception normal.        Mood and Affect: Mood and affect normal.        Speech: Speech normal.        Behavior: Behavior normal. Behavior is cooperative.        Thought Content: Thought content normal.        Cognition and Memory: Cognition and memory normal.        Judgment: Judgment normal.     Results for orders placed or performed in visit on 12/01/19  POCT Urinalysis Dipstick  Result Value Ref Range   Color, UA yellow    Clarity, UA clear    Glucose, UA Negative Negative   Bilirubin, UA Negative    Ketones, UA Negative    Spec Grav, UA 1.020 1.010 - 1.025   Blood, UA Negative    pH, UA 5.0 5.0 - 8.0   Protein, UA Negative Negative   Urobilinogen, UA 0.2 0.2 or 1.0 E.U./dL   Nitrite, UA Negative    Leukocytes, UA Negative Negative   Appearance     Odor        Assessment & Plan:   Problem List Items Addressed This Visit      Cardiovascular and Mediastinum   Hypertension - Primary    Currently well controlled hypertension with slight elevation in clinic today secondary to left shoulder pain.  BP goal < 130/80.  Pt is working on lifestyle modifications.  Taking medications tolerating well without side effects. No complications.  Plan: 1. Continue taking amlodipine 10mg  daily, metoprolol XL 50mg  daily and losartan 50mg  daily 2. Obtain labs before your next appointment  3. Encouraged heart healthy diet and increasing exercise to 30 minutes most days of the week, going no more than 2 days in a row without exercise. 4. Check BP 1-2 x per week at home, keep log, and bring to clinic at next appointment. 5. Follow up 6 months.        Relevant Orders   POCT Urinalysis Dipstick (Completed)     Musculoskeletal and  Integument   Rotator cuff syndrome of left shoulder    Pain  began s/p fall in the yard with an outstretched left arm.  Has had pain in the left shoulder, left trapezius and down left arm s/p fall.  Reduction in ROM, equal strength.  Discussed proceeding with physical therapy for strengthening exercises of the muscles around the left rotator cuff and beginning home exercises vs being referred to Orthopedics for evaluation.  Patient requesting to perform home exercises with a muscle relaxant at this time and if no improvement in a few weeks will request to proceed to Orthopedics.   Plan: 1) Home exercises given and shown in exam room how to perform 2) Can alternate heat/ice to the left shoulder, as needed for symptom management of pain 3) Take the baclofen up to 3x a day, be sure not to drive or operate heavy machinery while using this medication. 4) If having any worsening of symptoms or you have completed your home exercises for a few weeks without improvement, please call us, as we discussed, and will put in a referral to orthopedics and/or physical therapy.       Other Visit Diagnoses    Left shoulder strain, initial encounter       Relevant Medications   baclofen (LIORESAL) 10 MG tablet      Meds ordered this encounter  Medications  . baclofen (LIORESAL) 10 MG tablet    Sig: Take 1 tablet (10 mg total) by mouth 3 (three) times daily.    Dispense:  30 each    Refill:  0      Follow up plan: Return in about 6 months (around 06/02/2020) for HTN and left shoulder f/u.   Harlin Rain, [facility] Nurse Practitioner [facility] 12/01/2019, 1:15 PM

## 2019-12-01 NOTE — Patient Instructions (Addendum)
As we discussed, I am sending you in a muscle relaxer for your left sided shoulder and neck tension.  Take this as directed.  Try to get exercise a minimum of 30 minutes per day at least 5 days per week as well as  adequate water intake all while measuring blood pressure a few times per week.  Keep a blood pressure log and bring back to clinic at your next visit.  If your readings are consistently over 140/90 to contact our office/send me a MyChart message and we will see you sooner.  Can try DASH and Mediterranean diet options, avoiding processed foods, lowering sodium intake, avoiding pork products, and eating a plant based diet for optimal health.  Range of Motion Shoulder Exercises  Pendulum Circles - Lean with your good arm against a counter or table for support - Bend forward with a wide stance (make sure your body is comfortable) - Your painful shoulder should hang down and feel "heavy" - Gently move your painful arm in small circles "clockwise" for several turns - Switch to "counterclockwise" for several turns - Early on keep circles narrow and move slowly - Later in rehab, move in larger circles and faster movement   Wall Crawl - Stand close (about 1-2 ft away) to a wall, facing it directly - Reach out with your arm of painful shoulder and place fingers (not palm) on wall - You should make contact with wall at your waist level - Slowly walk your fingers up the wall. Stay in contact with wall entire time, do not remove fingers - Keep walking fingers up wall until you reach shoulder level - You may feel tightening or mild discomfort, once you reach a height that causes pain or if you are already above your shoulder height then stop. Repeat from starting position. - Early on stand closer to wall, move fingers slowly, and stay at or below shoulder level - Later in rehab, stand farther away from wall (fingertips), move fingers quicker, go above shoulder level   Let us know how your  shoulder exercises are going in the next few weeks.  If you are having improvement you can plan to continue.  If you are having similar symptoms or worsening, we can plan to send you to Orthopedics for evaluation.  It does appear on exam and based on your fall that you have a left rotator cuff tear.  We will plan to see you back in 6 months for follow up on your hypertension and left shoulder  You will receive a survey after today's visit either digitally by e-mail or paper by [place] mail. Your experiences and feedback matter to [place].  Please respond so we know how we are doing as we provide care for you.  Call us with any questions/concerns/needs.  It is my goal to be available to you for your health concerns.  Thanks for choosing me to be a partner in your healthcare needs!  Harlin Rain, FNP-C Family Nurse Practitioner [facility] Phone: [phone]

## 2019-12-01 NOTE — Assessment & Plan Note (Signed)
Currently well controlled hypertension with slight elevation in clinic today secondary to left shoulder pain.  BP goal < 130/80.  Pt is working on lifestyle modifications.  Taking medications tolerating well without side effects. No complications.  Plan: 1. Continue taking amlodipine 10mg  daily, metoprolol XL 50mg  daily and losartan 50mg  daily 2. Obtain labs before your next appointment  3. Encouraged heart healthy diet and increasing exercise to 30 minutes most days of the week, going no more than 2 days in a row without exercise. 4. Check BP 1-2 x per week at home, keep log, and bring to clinic at next appointment. 5. Follow up 6 months.

## 2019-12-01 NOTE — Assessment & Plan Note (Signed)
Pain began s/p fall in the yard with an outstretched left arm.  Has had pain in the left shoulder, left trapezius and down left arm s/p fall.  Reduction in ROM, equal strength.  Discussed proceeding with physical therapy for strengthening exercises of the muscles around the left rotator cuff and beginning home exercises vs being referred to Orthopedics for evaluation.  Patient requesting to perform home exercises with a muscle relaxant at this time and if no improvement in a few weeks will request to proceed to Orthopedics.   Plan: 1) Home exercises given and shown in exam room how to perform 2) Can alternate heat/ice to the left shoulder, as needed for symptom management of pain 3) Take the baclofen up to 3x a day, be sure not to drive or operate heavy machinery while using this medication. 4) If having any worsening of symptoms or you have completed your home exercises for a few weeks without improvement, please call us, as we discussed, and will put in a referral to orthopedics and/or physical therapy.

## 2019-12-13 ENCOUNTER — Other Ambulatory Visit: Payer: Self-pay

## 2019-12-13 DIAGNOSIS — E782 Mixed hyperlipidemia: Secondary | ICD-10-CM

## 2019-12-13 MED ORDER — GEMFIBROZIL 600 MG PO TABS: ORAL_TABLET | ORAL | 1 refills | Status: DC

## 2019-12-19 ENCOUNTER — Telehealth: Payer: Self-pay | Admitting: Family Medicine

## 2019-12-19 NOTE — Telephone Encounter (Signed)
Pt is wondering if she had her shingle. She is requesting  A call back

## 2019-12-19 NOTE — Telephone Encounter (Signed)
I spoke with the patient and recommended her to reach out to her insurance company and verify if they will cover the vaccine cost if she gets it done at her PCP office. The pt verbalize understanding, no questions or concerns.

## 2019-12-25 ENCOUNTER — Other Ambulatory Visit: Payer: Self-pay | Admitting: Nurse Practitioner

## 2019-12-25 DIAGNOSIS — F324 Major depressive disorder, single episode, in partial remission: Secondary | ICD-10-CM

## 2019-12-25 DIAGNOSIS — I10 Essential (primary) hypertension: Secondary | ICD-10-CM

## 2019-12-25 DIAGNOSIS — F5104 Psychophysiologic insomnia: Secondary | ICD-10-CM

## 2019-12-25 DIAGNOSIS — F419 Anxiety disorder, unspecified: Secondary | ICD-10-CM

## 2019-12-30 ENCOUNTER — Other Ambulatory Visit: Payer: Self-pay | Admitting: Nurse Practitioner

## 2019-12-30 DIAGNOSIS — E782 Mixed hyperlipidemia: Secondary | ICD-10-CM

## 2019-12-30 DIAGNOSIS — J302 Other seasonal allergic rhinitis: Secondary | ICD-10-CM

## 2020-01-02 ENCOUNTER — Ambulatory Visit (INDEPENDENT_AMBULATORY_CARE_PROVIDER_SITE_OTHER): Payer: PPO | Admitting: Family Medicine

## 2020-01-02 ENCOUNTER — Other Ambulatory Visit: Payer: Self-pay

## 2020-01-02 ENCOUNTER — Encounter: Payer: Self-pay | Admitting: Family Medicine

## 2020-01-02 VITALS — BP 130/66 | HR 62 | Temp 97.5°F | Wt 169.8 lb

## 2020-01-02 DIAGNOSIS — F324 Major depressive disorder, single episode, in partial remission: Secondary | ICD-10-CM | POA: Diagnosis not present

## 2020-01-02 DIAGNOSIS — E039 Hypothyroidism, unspecified: Secondary | ICD-10-CM | POA: Diagnosis not present

## 2020-01-02 DIAGNOSIS — R5383 Other fatigue: Secondary | ICD-10-CM | POA: Diagnosis not present

## 2020-01-02 DIAGNOSIS — F419 Anxiety disorder, unspecified: Secondary | ICD-10-CM | POA: Diagnosis not present

## 2020-01-02 MED ORDER — CITALOPRAM HYDROBROMIDE 20 MG PO TABS: ORAL_TABLET | ORAL | 1 refills | Status: DC

## 2020-01-02 NOTE — Patient Instructions (Signed)
Have your labs drawn and we will plan to see you back in clinic on next Wednesday.    Call with any questions/concerns/needs  You will receive a survey after today's visit either digitally by e-mail or paper by [place] mail. Your experiences and feedback matter to [place].  Please respond so we know how we are doing as we provide care for you.  Call us with any questions/concerns/needs.  It is my goal to be available to you for your health concerns.  Thanks for choosing me to be a partner in your healthcare needs!  Harlin Rain, FNP-C Family Nurse Practitioner [facility] Phone: [phone]

## 2020-01-02 NOTE — Assessment & Plan Note (Signed)
Current concern for fatigue, reports has been going on for a few weeks.  Will run labs for thyroid profile, CBC, CMP, and heavy metals labs.  Has levothyroxine 137 & 150mcg at home in the same bottle, so unknown dosage patient has been taking.  Will have labs completed and will send in updated levothyroxine prescription.  Discussed if labs WNL will refer for sleep study.

## 2020-01-02 NOTE — Progress Notes (Signed)
Subjective:    Patient ID: [name], female    DOB: 1941-07-15, 79 y.o.   MRN: [id number]  [name] is a 79 y.o. female presenting on 01/02/2020 for Dizziness (the pt state she fell like her head feel full. She also feel like her thyroid levels are abnormal. She complains of sluggishness and fatigue. )   HPI  Ms. Hirota presents to clinic with concerns of not feeling well, having fatigue and sluggishness.  Denies any fevers, sore throat, dizziness, light headedness, CP, abdominal pain, n/v/d.  Denies any changes to medications or dietary changes.  Reports that she does have levothyroxine 154mcg and 121mcg at home and they have been mixed into the same bottle.  Unsure the dosage she has been taking.  Reports snoring, but believes feels like she is waking up rested in the morning.  No history of OSA or sleep study in the past.  Depression screen [facility] 2/9 10/12/2019 06/06/2019 03/15/2019  Decreased Interest 1 0 0  Down, Depressed, Hopeless 1 0 0  PHQ - 2 Score 2 0 0  Altered sleeping 0 - -  Tired, decreased energy 3 - -  Change in appetite 0 - -  Feeling bad or failure about yourself  0 - -  Trouble concentrating 0 - -  Moving slowly or fidgety/restless 0 - -  Suicidal thoughts 0 - -  PHQ-9 Score 5 - -  Difficult doing work/chores Not difficult at all - -    Social History   Tobacco Use  . Smoking status: Current Every Day Smoker    Packs/day: 0.50    Years: 40.00    Pack years: 20.00    Types: Cigarettes  . Smokeless tobacco: Never Used  Substance Use Topics  . Alcohol use: No  . Drug use: No    Review of Systems  Constitutional: Positive for fatigue. Negative for activity change, appetite change, chills, diaphoresis, fever and unexpected weight change.  HENT: Negative.   Eyes: Negative.   Respiratory: Negative.   Cardiovascular: Negative.   Gastrointestinal: Negative.   Endocrine: Negative.   Genitourinary: Negative.   Musculoskeletal: Negative.     Skin: Negative.   Allergic/Immunologic: Negative.   Neurological: Negative.   Hematological: Negative.   Psychiatric/Behavioral: Negative.    Per HPI unless specifically indicated above     Objective:    BP 130/66 (BP Location: Left Arm, Patient Position: Sitting, Cuff Size: Large)   Pulse 62   Temp (!) 97.5 F (36.4 C) (Temporal)   Wt 169 lb 12.8 oz (77 kg)   LMP  (LMP Unknown)   BMI 32.08 kg/m   Wt Readings from Last 3 Encounters:  01/02/20 169 lb 12.8 oz (77 kg)  12/01/19 165 lb (74.8 kg)  10/12/19 161 lb 9.6 oz (73.3 kg)    Physical Exam Vitals reviewed.  Constitutional:      General: She is not in acute distress.    Appearance: Normal appearance. She is well-developed and well-groomed. She is obese. She is not ill-appearing or toxic-appearing.  HENT:     Head: Normocephalic.  Eyes:     General: Lids are normal. Vision grossly intact.        Right eye: No discharge.        Left eye: No discharge.     Extraocular Movements: Extraocular movements intact.     Conjunctiva/sclera: Conjunctivae normal.     Pupils: Pupils are equal, round, and reactive to light.  Cardiovascular:  Rate and Rhythm: Normal rate and regular rhythm.     Pulses: Normal pulses.          Dorsalis pedis pulses are 2+ on the right side and 2+ on the left side.       Posterior tibial pulses are 2+ on the right side and 2+ on the left side.     Heart sounds: Normal heart sounds. No murmur. No friction rub. No gallop.   Pulmonary:     Effort: Pulmonary effort is normal. No respiratory distress.     Breath sounds: Normal breath sounds.  Musculoskeletal:     Right lower leg: No edema.     Left lower leg: No edema.  Feet:     Right foot:     Skin integrity: Skin integrity normal.     Left foot:     Skin integrity: Skin integrity normal.  Skin:    General: Skin is warm and dry.     Capillary Refill: Capillary refill takes less than 2 seconds.  Neurological:     General: No focal deficit  present.     Mental Status: She is alert and oriented to person, place, and time.     Cranial Nerves: No cranial nerve deficit.     Sensory: No sensory deficit.     Motor: No weakness.     Coordination: Coordination normal.     Gait: Gait normal.  Psychiatric:        Attention and Perception: Attention and perception normal.        Mood and Affect: Mood and affect normal.        Speech: Speech normal.        Behavior: Behavior normal. Behavior is cooperative.        Thought Content: Thought content normal.        Cognition and Memory: Cognition and memory normal.        Judgment: Judgment normal.     Results for orders placed or performed in visit on 12/01/19  POCT Urinalysis Dipstick  Result Value Ref Range   Color, UA yellow    Clarity, UA clear    Glucose, UA Negative Negative   Bilirubin, UA Negative    Ketones, UA Negative    Spec Grav, UA 1.020 1.010 - 1.025   Blood, UA Negative    pH, UA 5.0 5.0 - 8.0   Protein, UA Negative Negative   Urobilinogen, UA 0.2 0.2 or 1.0 E.U./dL   Nitrite, UA Negative    Leukocytes, UA Negative Negative   Appearance     Odor        Assessment & Plan:   Problem List Items Addressed This Visit      Endocrine   Acquired hypothyroidism   Relevant Orders   Thyroid Panel With TSH     Other   Anxiety   Relevant Medications   citalopram (CELEXA) 20 MG tablet   Fatigue - Primary    Current concern for fatigue, reports has been going on for a few weeks.  Will run labs for thyroid profile, CBC, CMP, and heavy metals labs.  Has levothyroxine 137 & 13mcg at home in the same bottle, so unknown dosage patient has been taking.  Will have labs completed and will send in updated levothyroxine prescription.  Discussed if labs WNL will refer for sleep study.        Relevant Orders   Heavy Metals Panel, Blood   CBC with Differential   COMPLETE METABOLIC PANEL  WITH GFR    Other Visit Diagnoses    Major depressive disorder with single  episode, in partial remission ([place])       Relevant Medications   citalopram (CELEXA) 20 MG tablet      Meds ordered this encounter  Medications  . citalopram (CELEXA) 20 MG tablet    Sig: TAKE 1 TABLET(20 MG) BY MOUTH AT BEDTIME    Dispense:  90 tablet    Refill:  1      Follow up plan: Return in about 9 days (around 01/11/2020) for Lab and fatigue follow up.   Harlin Rain, [place] Family Nurse Practitioner [facility] 01/02/2020, 9:31 AM

## 2020-01-03 NOTE — Progress Notes (Signed)
Awaiting heavy metals screening labs.    Her labs were within normal limits except for an elevated calcium lab.  When I reviewed her chart, she met with Dr. Lucilla Lame with [facility] in Endocrinology regarding her hyperparathyroidism - the last note from 05/2019 says she is to follow up and have repeat labs and follow up appointment in 6 months but I don't see another completed appointment (should have been March 2021).  Can you have her call and schedule? [facility] [phone].

## 2020-01-03 NOTE — Progress Notes (Signed)
I would request that she contact Dr. Joycie Peek office and notify them of the concerns regarding the cost of the copays so they are aware and see if they can do anything about that.  Since her symptoms of fatigue have increased, it is important that she schedules this follow up with Dr. Gabriel Carina since this can be related to her hyperparathyroidism.  Once her symptoms are "stable", we can collaborate with Dr. Joycie Peek office and with specialist led treatment plan, we can try and complete some of the labs/testing through our office, but not until she has been stable with their office for a bit.  If it is a personality conflict with Dr. Joycie Peek office, I can put in a referral to another endocrinologist if she would like.   Thanks

## 2020-01-05 LAB — THYROID PANEL WITH TSH
Free Thyroxine Index: 2.9 (ref 1.4–3.8)
T3 Uptake: 31 % (ref 22–35)
T4, Total: 9.2 ug/dL (ref 5.1–11.9)
TSH: 1.85 mIU/L (ref 0.40–4.50)

## 2020-01-05 LAB — HEAVY METALS PANEL, BLOOD
Arsenic: <10 mcg/L (ref <23)
Lead: <1 ug/dL (ref <5)
Mercury, B: <5 mcg/L (ref 0–10)

## 2020-01-05 LAB — CBC WITH DIFFERENTIAL/PLATELET
Absolute Monocytes: 643 cells/uL (ref 200–950)
Basophils Absolute: 59 cells/uL (ref 0–200)
Basophils Relative: 1 %
Eosinophils Absolute: 153 cells/uL (ref 15–500)
Eosinophils Relative: 2.6 %
HCT: 44 % (ref 35.0–45.0)
Hemoglobin: 14.8 g/dL (ref 11.7–15.5)
Lymphs Abs: 2508 cells/uL (ref 850–3900)
MCH: 32.4 pg (ref 27.0–33.0)
MCHC: 33.6 g/dL (ref 32.0–36.0)
MCV: 96.3 fL (ref 80.0–100.0)
MPV: 13.5 fL — ABNORMAL HIGH (ref 7.5–12.5)
Monocytes Relative: 10.9 %
Neutro Abs: 2537 cells/uL (ref 1500–7800)
Neutrophils Relative %: 43 %
Platelets: 111 10*3/uL — ABNORMAL LOW (ref 140–400)
RBC: 4.57 10*6/uL (ref 3.80–5.10)
RDW: 12.4 % (ref 11.0–15.0)
Total Lymphocyte: 42.5 %
WBC: 5.9 10*3/uL (ref 3.8–10.8)

## 2020-01-05 LAB — COMPLETE METABOLIC PANEL WITH GFR
AG Ratio: 1.9 (calc) (ref 1.0–2.5)
ALT: 8 U/L (ref 6–29)
AST: 14 U/L (ref 10–35)
Albumin: 4.5 g/dL (ref 3.6–5.1)
Alkaline phosphatase (APISO): 80 U/L (ref 37–153)
BUN: 16 mg/dL (ref 7–25)
CO2: 26 mmol/L (ref 20–32)
Calcium: 11.2 mg/dL — ABNORMAL HIGH (ref 8.6–10.4)
Chloride: 106 mmol/L (ref 98–110)
Creat: 0.82 mg/dL (ref 0.60–0.93)
GFR, Est African American: 80 mL/min/{1.73_m2} (ref >=60)
GFR, Est Non African American: 69 mL/min/{1.73_m2} (ref >=60)
Globulin: 2.4 g/dL (calc) (ref 1.9–3.7)
Glucose, Bld: 88 mg/dL (ref 65–99)
Potassium: 4.1 mmol/L (ref 3.5–5.3)
Sodium: 141 mmol/L (ref 135–146)
Total Bilirubin: 1 mg/dL (ref 0.2–1.2)
Total Protein: 6.9 g/dL (ref 6.1–8.1)

## 2020-01-06 ENCOUNTER — Other Ambulatory Visit: Payer: Self-pay | Admitting: Family Medicine

## 2020-01-06 DIAGNOSIS — E213 Hyperparathyroidism, unspecified: Secondary | ICD-10-CM

## 2020-01-06 DIAGNOSIS — E039 Hypothyroidism, unspecified: Secondary | ICD-10-CM

## 2020-01-06 MED ORDER — LEVOTHYROXINE SODIUM 125 MCG PO TABS: 125.0000 ug | ORAL_TABLET | Freq: Every day | ORAL | 0 refills | Status: DC

## 2020-01-06 NOTE — Progress Notes (Signed)
Refill on levothyroxine sent, as during patient appt she had mixed her 13mcg and 180mcg tablets together in the same container and was unsure what dosage she was taking.

## 2020-01-06 NOTE — Progress Notes (Signed)
Heavy metal labs were within normal limits.  I will put in a referral to another endocrinologist for her to establish with.  She should hear something from the referral coordinator within a week. Thanks

## 2020-01-25 ENCOUNTER — Other Ambulatory Visit: Payer: Self-pay

## 2020-01-26 DIAGNOSIS — H02035 Senile entropion of left lower eyelid: Secondary | ICD-10-CM | POA: Diagnosis not present

## 2020-01-26 DIAGNOSIS — H02045 Spastic entropion of left lower eyelid: Secondary | ICD-10-CM | POA: Diagnosis not present

## 2020-01-27 ENCOUNTER — Ambulatory Visit (INDEPENDENT_AMBULATORY_CARE_PROVIDER_SITE_OTHER): Payer: PPO | Admitting: Endocrinology

## 2020-01-27 ENCOUNTER — Encounter: Payer: Self-pay | Admitting: Endocrinology

## 2020-01-27 ENCOUNTER — Other Ambulatory Visit: Payer: Self-pay

## 2020-01-27 VITALS — BP 134/70 | HR 66 | Ht 61.0 in | Wt 170.0 lb

## 2020-01-27 DIAGNOSIS — E213 Hyperparathyroidism, unspecified: Secondary | ICD-10-CM | POA: Diagnosis not present

## 2020-01-27 NOTE — Patient Instructions (Addendum)
Blood tests are requested for you today.  We'll let you know about the results.   Let's check a bone-density test.  you will receive a phone call, about a day and time for an appointment.   Please see a surgery specialist.  you will receive a phone call, about a day and time for an appointment, for this, too.

## 2020-01-27 NOTE — Progress Notes (Signed)
Subjective:    Patient ID: [name], female    DOB: 1941/02/05, 79 y.o.   MRN: [id number]  HPI Pt is referred by Cyndia Skeeters, NP, for hypercalcemia.  Pt was noted to have moderate hypercalcemia in 2019.  she has never had osteoporosis, urolithiasis, sarcoidosis, cancer, PUD, pancreatitis, and depression.  Only bony fracture was left forearm (2020, with a fall).  she does not take vitamin-D or A supplements (except a multivitamin).  Pt denies taking antacids, Li++, or HCTZ.  Main symptom is fatigue.   Past Medical History:  Diagnosis Date  . Allergy   . Anemia   . Anxiety   . Cancer ([place])    skin cancer  . Hyperlipidemia   . Hypertension   . Hypothyroidism   . Sciatica   . Sleep apnea    does not use cpap  . Thyroid disease     Past Surgical History:  Procedure Laterality Date  . ABDOMINAL HYSTERECTOMY    . APPENDECTOMY    . BACK SURGERY     lower  . BILATERAL CARPAL TUNNEL RELEASE    . BREAST CYST ASPIRATION Left    neg  . COLONOSCOPY    . JOINT REPLACEMENT    . KNEE SURGERY Bilateral   . KNEE SURGERY    . OPEN REDUCTION INTERNAL FIXATION (ORIF) DISTAL RADIAL FRACTURE Left 06/23/2019   Procedure: OPEN REDUCTION INTERNAL FIXATION (ORIF) DISTAL RADIAL FRACTURE;  Surgeon: Hessie Knows, MD;  Location: [facility] ORS;  Service: Orthopedics;  Laterality: Left;  . TOTAL KNEE REVISION Right 08/13/2015   Procedure: TOTAL KNEE REVISION;  Surgeon: Paralee Cancel, MD;  Location: [facility] ORS;  Service: Orthopedics;  Laterality: Right;    Social History   Socioeconomic History  . Marital status: Married    Spouse name: Not on file  . Number of children: Not on file  . Years of education: Not on file  . Highest education level: Not on file  Occupational History  . Occupation: retired  Tobacco Use  . Smoking status: Current Every Day Smoker    Packs/day: 0.50    Years: 40.00    Pack years: 20.00    Types: Cigarettes  . Smokeless tobacco: Never Used  Substance and Sexual  Activity  . Alcohol use: No  . Drug use: No  . Sexual activity: Not Currently  Other Topics Concern  . Not on file  Social History Narrative  . Not on file   Social Determinants of Health   Financial Resource Strain: Low Risk   . Difficulty of Paying Living Expenses: Not hard at all  Food Insecurity: No Food Insecurity  . Worried About Charity fundraiser in the Last Year: Never true  . Ran Out of Food in the Last Year: Never true  Transportation Needs: No Transportation Needs  . Lack of Transportation (Medical): No  . Lack of Transportation (Non-Medical): No  Physical Activity: Insufficiently Active  . Days of Exercise per Week: 5 days  . Minutes of Exercise per Session: 20 min  Stress: No Stress Concern Present  . Feeling of Stress : Not at all  Social Connections: Somewhat Isolated  . Frequency of Communication with Friends and Family: More than three times a week  . Frequency of Social Gatherings with Friends and Family: Once a week  . Attends Religious Services: Never  . Active Member of Clubs or Organizations: No  . Attends Archivist Meetings: Never  . Marital Status: Married  Human resources officer  Violence: Not At Risk  . Fear of Current or Ex-Partner: No  . Emotionally Abused: No  . Physically Abused: No  . Sexually Abused: No    Current Outpatient Medications on File Prior to Visit  Medication Sig Dispense Refill  . acetaminophen (TYLENOL) 500 MG tablet Take 500 mg by mouth every 6 (six) hours as needed (pain.).    Marland Kitchen ALPRAZolam (XANAX) 0.25 MG tablet Take 1 tablet (0.25 mg total) by mouth at bedtime as needed for anxiety or sleep. 30 tablet 5  . amLODipine (NORVASC) 10 MG tablet TAKE 1 TABLET(10 MG) BY MOUTH DAILY (Patient taking differently: Take 10 mg by mouth every morning. TAKE 1 TABLET(10 MG) BY MOUTH DAILY) 90 tablet 1  . aspirin EC 81 MG tablet Take 81 mg by mouth every other day. In the morning.    . Azelastine HCl 0.15 % SOLN azelastine 0.15 %  (205.5 mcg) nasal spray    . cetirizine (ZYRTEC) 10 MG tablet Take 1 tablet (10 mg total) by mouth daily. 90 tablet 1  . citalopram (CELEXA) 20 MG tablet TAKE 1 TABLET(20 MG) BY MOUTH AT BEDTIME 90 tablet 1  . furosemide (LASIX) 20 MG tablet Take 1 tablet (20 mg total) by mouth every morning. 90 tablet 1  . gemfibrozil (LOPID) 600 MG tablet TAKE 1 TABLET BY MOUTH 2 TIMES DAILY BEFORE A MEAL 180 tablet 1  . levothyroxine (SYNTHROID) 125 MCG tablet Take 1 tablet (125 mcg total) by mouth daily. 90 tablet 0  . losartan (COZAAR) 50 MG tablet TAKE 1 TABLET(50 MG) BY MOUTH DAILY 90 tablet 3  . metoprolol succinate (TOPROL-XL) 50 MG 24 hr tablet Take 1 tablet (50 mg total) by mouth every morning. 90 tablet 0  . montelukast (SINGULAIR) 10 MG tablet TAKE 1 TABLET(10 MG) BY MOUTH DAILY 90 tablet 1  . Multiple Vitamin (MULTIVITAMIN WITH MINERALS) TABS tablet Take 1 tablet by mouth daily. Centrum Silver Multivitamin    . rOPINIRole (REQUIP) 0.5 MG tablet TAKE 1 TABLET BY MOUTH EVERY NIGHT 90 tablet 1  . simvastatin (ZOCOR) 40 MG tablet TAKE 1 TABLET(40 MG) BY MOUTH AT BEDTIME 90 tablet 1  . traZODone (DESYREL) 50 MG tablet TAKE 1 TABLET(50 MG) BY MOUTH AT BEDTIME 90 tablet 1   No current facility-administered medications on file prior to visit.    Allergies  Allergen Reactions  . Codeine Nausea Only    Tolerates oxycodone  . Morphine Nausea And Vomiting  . Ace Inhibitors Cough  . Adhesive [Tape] Other (See Comments) and Rash    whelps    Family History  Problem Relation Age of Onset  . Breast cancer Paternal Aunt   . Dementia Mother   . Dementia Sister   . Heart attack Brother   . Diabetes Brother   . Heart attack Son   . Hypercalcemia Neg Hx     BP 134/70   Pulse 66   Ht 5\' 1"  (1.549 m)   Wt 170 lb (77.1 kg)   LMP  (LMP Unknown)   SpO2 99%   BMI 32.12 kg/m     Review of Systems Denies weight loss, visual loss, cough, edema, diarrhea, polyuria, hematuria, depression, numbness,  and back pain.       Objective:   Physical Exam VS: see vs page GEN: no distress HEAD: head: no deformity eyes: no periorbital swelling, no proptosis external nose and ears are normal NECK: supple, thyroid is not enlarged CHEST WALL: no deformity.  No kyphosis LUNGS:  clear to auscultation CV: reg rate and rhythm, no murmur.  MUSCULOSKELETAL: muscle bulk and strength are grossly normal.  no obvious joint swelling.  gait is normal and steady.   EXTEMITIES: no deformity.  no edema PULSES: no carotid bruit NEURO:  cn 2-12 grossly intact.   readily moves all 4's.  sensation is intact to touch on all 4's.   SKIN:  Normal texture and temperature.  No rash or suspicious lesion is visible.   NODES:  None palpable at the neck PSYCH: alert, well-oriented.  Does not appear anxious nor depressed.     Lab Results  Component Value Date   PTH 124 (H) 04/14/2019   CALCIUM 11.2 (H) 01/02/2020   CAION 6.0 (H) 04/14/2019   Lab Results  Component Value Date   TSH 1.85 01/02/2020   T4TOTAL 9.2 01/02/2020    Lab Results  Component Value Date   ALT 8 01/02/2020   AST 14 01/02/2020   ALKPHOS 88 12/21/2017   BILITOT 1.0 01/02/2020     These are normal: PTHrP, 1,25-OH vit-D, and 25-OH vit-D.     I have reviewed outside records, and summarized: Pt was noted to have elevated Ca++, and referred here.  Main symptom was fatigue.  Other eval for this were neg.       Assessment & Plan:  Primary hyperparathyroidism, new to me.  Fatigue is really bothering her.  This should be considered a reason to consider surgery.    Patient Instructions  Blood tests are requested for you today.  We'll let you know about the results.   Let's check a bone-density test.  you will receive a phone call, about a day and time for an appointment.   Please see a surgery specialist.  you will receive a phone call, about a day and time for an appointment, for this, too.

## 2020-01-30 LAB — VITAMIN A: Vitamin A (Retinoic Acid): 62 ug/dL (ref 38–98)

## 2020-01-30 LAB — PTH, INTACT AND CALCIUM
Calcium: 11.7 mg/dL — ABNORMAL HIGH (ref 8.6–10.4)
PTH: 146 pg/mL — ABNORMAL HIGH (ref 14–64)

## 2020-02-01 DIAGNOSIS — Z85828 Personal history of other malignant neoplasm of skin: Secondary | ICD-10-CM | POA: Diagnosis not present

## 2020-02-01 DIAGNOSIS — L821 Other seborrheic keratosis: Secondary | ICD-10-CM | POA: Diagnosis not present

## 2020-02-01 DIAGNOSIS — Z872 Personal history of diseases of the skin and subcutaneous tissue: Secondary | ICD-10-CM | POA: Diagnosis not present

## 2020-02-01 DIAGNOSIS — X32XXXA Exposure to sunlight, initial encounter: Secondary | ICD-10-CM | POA: Diagnosis not present

## 2020-02-01 DIAGNOSIS — Z08 Encounter for follow-up examination after completed treatment for malignant neoplasm: Secondary | ICD-10-CM | POA: Diagnosis not present

## 2020-02-01 DIAGNOSIS — L570 Actinic keratosis: Secondary | ICD-10-CM | POA: Diagnosis not present

## 2020-02-14 ENCOUNTER — Other Ambulatory Visit: Payer: Self-pay | Admitting: Family Medicine

## 2020-02-14 DIAGNOSIS — I10 Essential (primary) hypertension: Secondary | ICD-10-CM

## 2020-02-14 NOTE — Telephone Encounter (Signed)
Pt must keep upcoming appt in 3 months. Requested Prescriptions  Pending Prescriptions Disp Refills  . metoprolol succinate (TOPROL-XL) 50 MG 24 hr tablet [Pharmacy Med Name: METOPROLOL ER SUCCINATE 50MG  TABS] 90 tablet 0    Sig: TAKE 1 TABLET(50 MG) BY MOUTH EVERY MORNING     Cardiovascular:  Beta Blockers Passed - 02/14/2020  9:36 AM      Passed - Last BP in normal range    BP Readings from Last 1 Encounters:  01/27/20 134/70         Passed - Last Heart Rate in normal range    Pulse Readings from Last 1 Encounters:  01/27/20 66         Passed - Valid encounter within last 6 months    Recent Outpatient Visits          1 month ago Fatigue, unspecified type   [place], FNP   2 months ago Essential hypertension   [facility], Lupita Raider, FNP   4 months ago Acquired hypothyroidism   [place], DO   8 months ago Hyperparathyroidism [facility])   [facility] Merrilyn Puma, Jerrel Ivory, NP   10 months ago [facility] Merrilyn Puma, Jerrel Ivory, NP      Future Appointments            In 3 months [place], Lupita Raider, [facility], [facility]

## 2020-02-15 DIAGNOSIS — E213 Hyperparathyroidism, unspecified: Secondary | ICD-10-CM | POA: Diagnosis not present

## 2020-02-18 ENCOUNTER — Other Ambulatory Visit: Payer: Self-pay | Admitting: Nurse Practitioner

## 2020-02-18 DIAGNOSIS — I10 Essential (primary) hypertension: Secondary | ICD-10-CM

## 2020-02-18 NOTE — Telephone Encounter (Signed)
Requested Prescriptions  Pending Prescriptions Disp Refills  . amLODipine (NORVASC) 10 MG tablet [Pharmacy Med Name: AMLODIPINE BESYLATE 10MG  TABLETS] 90 tablet 0    Sig: TAKE 1 TABLET(10 MG) BY MOUTH DAILY     Cardiovascular:  Calcium Channel Blockers Passed - 02/18/2020 10:16 AM      Passed - Last BP in normal range    BP Readings from Last 1 Encounters:  01/27/20 134/70         Passed - Valid encounter within last 6 months    Recent Outpatient Visits          1 month ago Fatigue, unspecified type   [place], FNP   2 months ago Essential hypertension   [facility], Lupita Raider, FNP   4 months ago Acquired hypothyroidism   [place], DO   8 months ago Hyperparathyroidism [facility])   [facility] Merrilyn Puma, Jerrel Ivory, NP   10 months ago [facility] Merrilyn Puma, Jerrel Ivory, NP      Future Appointments            In 3 months [place], Lupita Raider, [facility], [facility]

## 2020-02-21 ENCOUNTER — Other Ambulatory Visit: Payer: Self-pay | Admitting: Surgery

## 2020-02-21 DIAGNOSIS — E213 Hyperparathyroidism, unspecified: Secondary | ICD-10-CM

## 2020-02-29 ENCOUNTER — Encounter (HOSPITAL_COMMUNITY)
Admission: RE | Admit: 2020-02-29 | Discharge: 2020-02-29 | Disposition: A | Discharge status: Home / Self Care | Payer: PPO | Source: Ambulatory Visit | Attending: Surgery | Admitting: Surgery

## 2020-02-29 ENCOUNTER — Other Ambulatory Visit: Payer: Self-pay

## 2020-02-29 DIAGNOSIS — E213 Hyperparathyroidism, unspecified: Secondary | ICD-10-CM | POA: Insufficient documentation

## 2020-02-29 DIAGNOSIS — E0590 Thyrotoxicosis, unspecified without thyrotoxic crisis or storm: Secondary | ICD-10-CM | POA: Diagnosis not present

## 2020-02-29 MED ORDER — TECHNETIUM TC 99M SESTAMIBI - CARDIOLITE
26.0000 | Freq: Once | INTRAVENOUS | Status: AC | PRN
  Administered 2020-02-29: 26 via INTRAVENOUS

## 2020-03-29 ENCOUNTER — Other Ambulatory Visit: Payer: Self-pay | Admitting: Family Medicine

## 2020-03-29 DIAGNOSIS — G2581 Restless legs syndrome: Secondary | ICD-10-CM

## 2020-04-18 ENCOUNTER — Other Ambulatory Visit: Payer: Self-pay | Admitting: Family Medicine

## 2020-04-18 ENCOUNTER — Encounter (HOSPITAL_COMMUNITY): Payer: Self-pay

## 2020-04-18 DIAGNOSIS — I10 Essential (primary) hypertension: Secondary | ICD-10-CM

## 2020-04-19 NOTE — Progress Notes (Signed)
Pt. Needs orders for the upcomming surgery.PST appointment on: 04/20/20.Thanks.

## 2020-04-19 NOTE — Patient Instructions (Addendum)
DUE TO COVID-19 ONLY ONE VISITOR IS ALLOWED TO COME WITH YOU AND STAY IN THE WAITING ROOM ONLY DURING PRE OP AND PROCEDURE DAY OF SURGERY. THE 1 VISITOR MAY VISIT WITH YOU AFTER SURGERY IN YOUR PRIVATE ROOM DURING VISITING HOURS ONLY!  YOU NEED TO HAVE A COVID 19 TEST ON: 04/24/20 @ 10:00 am, THIS TEST MUST BE DONE BEFORE SURGERY, COME TO COVID TESTING SITE [address] [place] [address], IT IS ON THE RIGHT GOING OUT [address] [place] 2 MINUTES PAST [employer] ON THE RIGHT. ONCE YOUR COVID TEST IS COMPLETED,  PLEASE BEGIN THE QUARANTINE INSTRUCTIONS AS OUTLINED IN YOUR HANDOUT.                [name]   Your procedure is scheduled on: 04/27/20   Report to [facility] Main  Entrance   Report to admitting at: 7:30 AM     Call this number if you have problems the morning of surgery [phone]    Remember: Do not eat food or drink liquids :After Midnight.   BRUSH YOUR TEETH MORNING OF SURGERY AND RINSE YOUR MOUTH OUT, NO CHEWING GUM CANDY OR MINTS.     Take these medicines the morning of surgery with A SIP OF WATER: amlodipine,levothyroxine,metoprolol,cetirizine(as needed). Use nasal spray as usual.                               You may not have any metal on your body including hair pins and              piercings  Do not wear jewelry, make-up, lotions, powders or perfumes, deodorant             Do not wear nail polish on your fingernails.  Do not shave  48 hours prior to surgery.       Do not bring valuables to the hospital. [place].  Contacts, dentures or bridgework may not be worn into surgery.  Leave suitcase in the car. After surgery it may be brought to your room.     Patients discharged the day of surgery will not be allowed to drive home. IF YOU ARE HAVING SURGERY AND GOING HOME THE SAME DAY, YOU MUST HAVE AN ADULT TO DRIVE YOU HOME AND BE WITH YOU FOR 24 HOURS. YOU MAY GO  HOME BY TAXI OR UBER OR ORTHERWISE, BUT AN ADULT MUST ACCOMPANY YOU HOME AND STAY WITH YOU FOR 24 HOURS.  Name and phone number of your driver:  Special Instructions: N/A              Please read over the following fact sheets you were given: _____________________________________________________________________       [facility] - Preparing for Surgery Before surgery, you can play an important role.  Because skin is not sterile, your skin needs to be as free of germs as possible.  You can reduce the number of germs on your skin by washing with CHG (chlorahexidine gluconate) soap before surgery.  CHG is an antiseptic cleaner which kills germs and bonds with the skin to continue killing germs even after washing. Please DO NOT use if you have an allergy to CHG or antibacterial soaps.  If your skin becomes reddened/irritated stop using the CHG and inform your nurse when you arrive  at Short Stay. Do not shave (including legs and underarms) for at least 48 hours prior to the first CHG shower.  You may shave your face/neck. Please follow these instructions carefully:  1.  Shower with CHG Soap the night before surgery and the  morning of Surgery.  2.  If you choose to wash your hair, wash your hair first as usual with your  normal  shampoo.  3.  After you shampoo, rinse your hair and body thoroughly to remove the  shampoo.                           4.  Use CHG as you would any other liquid soap.  You can apply chg directly  to the skin and wash                       Gently with a scrungie or clean washcloth.  5.  Apply the CHG Soap to your body ONLY FROM THE NECK DOWN.   Do not use on face/ open                           Wound or open sores. Avoid contact with eyes, ears mouth and genitals (private parts).                       Wash face,  Genitals (private parts) with your normal soap.             6.  Wash thoroughly, paying special attention to the area where your surgery  will be performed.  7.   Thoroughly rinse your body with warm water from the neck down.  8.  DO NOT shower/wash with your normal soap after using and rinsing off  the CHG Soap.                9.  Pat yourself dry with a clean towel.            10.  Wear clean pajamas.            11.  Place clean sheets on your bed the night of your first shower and do not  sleep with pets. Day of Surgery : Do not apply any lotions/deodorants the morning of surgery.  Please wear clean clothes to the hospital/surgery center.  FAILURE TO FOLLOW THESE INSTRUCTIONS MAY RESULT IN THE CANCELLATION OF YOUR SURGERY PATIENT SIGNATURE_________________________________  NURSE SIGNATURE__________________________________  ________________________________________________________________________

## 2020-04-20 ENCOUNTER — Encounter (HOSPITAL_COMMUNITY)
Admission: RE | Admit: 2020-04-20 | Discharge: 2020-04-20 | Disposition: A | Discharge status: Home / Self Care | Payer: PPO | Source: Ambulatory Visit | Attending: Surgery | Admitting: Surgery

## 2020-04-20 ENCOUNTER — Other Ambulatory Visit: Payer: Self-pay

## 2020-04-20 ENCOUNTER — Encounter (HOSPITAL_COMMUNITY): Payer: Self-pay

## 2020-04-20 DIAGNOSIS — Z01812 Encounter for preprocedural laboratory examination: Secondary | ICD-10-CM | POA: Insufficient documentation

## 2020-04-20 LAB — BASIC METABOLIC PANEL
Anion gap: 9 (ref 5–15)
BUN: 12 mg/dL (ref 8–23)
CO2: 26 mmol/L (ref 22–32)
Calcium: 11.1 mg/dL — ABNORMAL HIGH (ref 8.9–10.3)
Chloride: 107 mmol/L (ref 98–111)
Creatinine, Ser: 0.81 mg/dL (ref 0.44–1.00)
GFR calc Af Amer: >60 mL/min (ref >60)
GFR calc non Af Amer: >60 mL/min (ref >60)
Glucose, Bld: 85 mg/dL (ref 70–99)
Potassium: 3.7 mmol/L (ref 3.5–5.1)
Sodium: 142 mmol/L (ref 135–145)

## 2020-04-20 LAB — CBC
HCT: 46 % (ref 36.0–46.0)
Hemoglobin: 15.3 g/dL — ABNORMAL HIGH (ref 12.0–15.0)
MCH: 32.3 pg (ref 26.0–34.0)
MCHC: 33.3 g/dL (ref 30.0–36.0)
MCV: 97 fL (ref 80.0–100.0)
Platelets: 135 10*3/uL — ABNORMAL LOW (ref 150–400)
RBC: 4.74 MIL/uL (ref 3.87–5.11)
RDW: 12.9 % (ref 11.5–15.5)
WBC: 7.1 10*3/uL (ref 4.0–10.5)
nRBC: 0 % (ref 0.0–0.2)

## 2020-04-20 NOTE — Progress Notes (Signed)
Pt. Was advised by RN to bring the control for the spinal cord stimulator,the day of surgery.Pt. verbalized her understanding of this matter.

## 2020-04-20 NOTE — Progress Notes (Signed)
COVID Vaccine Completed:yes Date COVID Vaccine completed:11/28/19 COVID vaccine manufacturer: *[place]   PCP - Elmyra Ricks [place]: FNP. LOV: 01/02/20 Cardiologist - No  Chest x-ray -  EKG - 06/21/19. Epic. Stress Test -  ECHO -  Cardiac Cath -   Sleep Study - yes CPAP - No  Fasting Blood Sugar -  Checks Blood Sugar _____ times a day  Blood Thinner Instructions: Aspirin Instructions: Pt. Is not taking aspirin at this moment. Last Dose:  Anesthesia review: HTN,OSA,(No CPAP),espinal cord stimulator.  Patient denies shortness of breath, fever, cough and chest pain at PAT appointment   Patient verbalized understanding of instructions that were given to them at the PAT appointment. Patient was also instructed that they will need to review over the PAT instructions again at home before surgery.

## 2020-04-23 ENCOUNTER — Other Ambulatory Visit: Payer: Self-pay | Admitting: Family Medicine

## 2020-04-23 DIAGNOSIS — E039 Hypothyroidism, unspecified: Secondary | ICD-10-CM

## 2020-04-24 ENCOUNTER — Other Ambulatory Visit (HOSPITAL_COMMUNITY)
Admission: RE | Admit: 2020-04-24 | Discharge: 2020-04-24 | Disposition: A | Discharge status: Home / Self Care | Payer: PPO | Source: Ambulatory Visit | Attending: Surgery | Admitting: Surgery

## 2020-04-24 DIAGNOSIS — Z20822 Contact with and (suspected) exposure to covid-19: Secondary | ICD-10-CM | POA: Insufficient documentation

## 2020-04-24 DIAGNOSIS — Z01812 Encounter for preprocedural laboratory examination: Secondary | ICD-10-CM | POA: Insufficient documentation

## 2020-04-24 LAB — SARS CORONAVIRUS 2 (TAT 6-24 HRS): SARS Coronavirus 2: NEGATIVE

## 2020-04-25 NOTE — H&P (Signed)
Idalia Needle [place]erg Location: [place] Office Patient #: [id number] DOB: 05-22-1941 Married / Language: English / Race: White Female   History of Present Illness  The patient is a 79 year old female who presents with primary hyperparathyroidism. Mrs. Palardy is a 79 year old white female referred by Dr. Franco Nones and who had seen Lonie Peak, nurse practitioner for hypercalcemia. She was noted to have moderate outcome hypercalcemia in 2019 and when it was checked again this year it was 11.2. PTH level is 124 CA ON of 6. Her thyroid functions are otherwise normal.  She's not had a sestamibi scan as she had. She aches all over and is really impacting her ability to walk and do things. She has been taking care of her husband who had to have a left AKA and also she has an inversion of her lower left eyelid which has surgery pending.  He is on numerous medications including Tylenol, Xanax, Norvasc, Zyrtec, Celexa, Lasix, Lopid, Synthroid, Cozaar, Toprol wall XL, Singulair, Requip, Zocor, and Desyrel.  I described getting a sestamibi scan and doing a targeted parathyroid resection. I explained this to them in some detail. She wants to get this on as soon as she can. We'll need to get the sestamibi scan first.  Scan showed right inferior parathyoid activity.     Past Surgical History Malachi Bonds, CMA; 02/15/2020 2:59 PM) Appendectomy  Hysterectomy (not due to cancer) - Complete  Knee Surgery  Bilateral. Tonsillectomy   Diagnostic Studies History Malachi Bonds, CMA; 02/15/2020 2:59 PM) Colonoscopy  1-5 years ago Mammogram  within last year  Allergies Malachi Bonds, CMA; 02/15/2020 3:05 PM) Codeine Sulfate *ANALGESICS - OPIOID*  Morphine Sulfate (PF) *ANALGESICS - OPIOID*  ACE Inhibitors  Adhesive Tape   Medication History (Chemira Jones, CMA; 02/15/2020 3:08 PM) Tylenol (500MG  Capsule, Oral) Active. ALPRAZolam (0.25MG  Tablet, Oral) Active. amLODIPine Besylate (10MG   Tablet, Oral) Active. Aspirin (81MG  Tablet, Oral) Active. Azelastine HCl (0.15% Solution, Nasal) Active. Cetirizine HCl (10MG  Tablet, Oral) Active. Escitalopram Oxalate (20MG  Tablet, Oral) Active. Furosemide (20MG  Tablet, Oral) Active. Gemfibrozil (600MG  Tablet, Oral) Active. Levothyroxine Sodium (125MCG Capsule, Oral) Active. Losartan Potassium (50MG  Tablet, Oral) Active. Metoprolol Succinate ER (50MG  Tablet ER 24HR, Oral) Active. Montelukast Sodium (10MG  Tablet, Oral) Active. Multivitamin (Oral) Active. rOPINIRole HCl (0.5MG  Tablet, Oral) Active. Simvastatin (40MG  Tablet, Oral) Active. traZODone HCl (50MG  Tablet, Oral) Active. Medications Reconciled  Social History Malachi Bonds, CMA; 02/15/2020 2:59 PM) Alcohol use  Remotely quit alcohol use. Caffeine use  Coffee. No drug use  Tobacco use  Current every day smoker.  Family History Malachi Bonds, [place]; 02/15/2020 2:59 PM) Arthritis  Sister.  Pregnancy / Birth History Malachi Bonds, CMA; 02/15/2020 2:59 PM) Age at menarche  56 years. Age of menopause  65-60 Gravida  4 Maternal age  46-20 Para  47  Other Problems Malachi Bonds, CMA; 02/15/2020 2:59 PM) Thyroid Disease     Review of Systems Malachi Bonds CMA; 02/15/2020 2:59 PM) General Present- Fatigue and Weight Gain. Not Present- Appetite Loss, Chills, Fever, Night Sweats and Weight Loss. Skin Not Present- Change in Wart/Mole, Dryness, Hives, Jaundice, New Lesions, Non-Healing Wounds, Rash and Ulcer. HEENT Present- Ringing in the Ears, Seasonal Allergies and Wears glasses/contact lenses. Not Present- Earache, Hearing Loss, Hoarseness, Nose Bleed, Oral Ulcers, Sinus Pain, Sore Throat, Visual Disturbances and Yellow Eyes. Breast Not Present- Breast Mass, Breast Pain, Nipple Discharge and Skin Changes. Cardiovascular Not Present- Chest Pain, Difficulty Breathing Lying Down, Leg Cramps, Palpitations, Rapid Heart Rate, Shortness of Breath and  Swelling  of Extremities. Gastrointestinal Not Present- Abdominal Pain, Bloating, Bloody Stool, Change in Bowel Habits, Chronic diarrhea, Constipation, Difficulty Swallowing, Excessive gas, Gets full quickly at meals, Hemorrhoids, Indigestion, Nausea, Rectal Pain and Vomiting. Female Genitourinary Not Present- Frequency, Nocturia, Painful Urination, Pelvic Pain and Urgency. Musculoskeletal Present- Joint Pain. Not Present- Back Pain, Joint Stiffness, Muscle Pain, Muscle Weakness and Swelling of Extremities. Neurological Not Present- Decreased Memory, Fainting, Headaches, Numbness, Seizures, Tingling, Tremor, Trouble walking and Weakness. Psychiatric Not Present- Anxiety, Bipolar, Change in Sleep Pattern, Depression, Fearful and Frequent crying. Endocrine Present- Cold Intolerance. Not Present- Excessive Hunger, Hair Changes, Heat Intolerance, Hot flashes and New Diabetes. Hematology Not Present- Blood Thinners, Easy Bruising, Excessive bleeding, Gland problems, HIV and Persistent Infections.  Vitals (Chemira Jones CMA; 02/15/2020 3:05 PM) 02/15/2020 3:04 PM Weight: 169.2 lb Height: 63.5in Body Surface Area: 1.81 m Body Mass Index: 29.5 kg/m  Pulse: 64 (Regular)  BP: 130/80(Sitting, Left Arm, Standard)       Physical Exam (Bindi Klomp B. Hassell Done MD; 02/15/2020 4:02 PM) General Note: HEENT exam is remarkable in that she has inversion of her lower eyelid on the left. Neck no bruits are heard and no masses are palpated. She says she feels more full on the right side. Chest clear to auscultation Heart sinus rhythm without rubs or gallops. Abdomen nontender. Extremities she's had a previous fracture of her left radius but denies pathologic fractures.     Assessment & Plan Rodman Key B. Hassell Done MD; 02/15/2020 4:03 PM) HYPERPARATHYROIDISM (E21.3) Focal activity localizing to the inferior posterior aspect of the RIGHT lobe of thyroid gland consistent with parathyroid adenoma.  Plan:  Right inferior  parathyroidectomy  Matt B. Hassell Done, MD, FACS

## 2020-04-26 NOTE — Anesthesia Preprocedure Evaluation (Addendum)
Anesthesia Evaluation  Patient identified by MRN, date of birth, ID band Patient awake    Reviewed: Allergy & Precautions, NPO status , Patient's Chart, lab work & pertinent test results, reviewed documented beta blocker date and time   History of Anesthesia Complications Negative for: history of anesthetic complications  Airway Mallampati: II  TM Distance: >3 FB Neck ROM: Full    Dental  (+) Edentulous Upper, Edentulous Lower   Pulmonary sleep apnea , Current Smoker and Patient abstained from smoking.,    Pulmonary exam normal        Cardiovascular hypertension, Pt. on home beta blockers and Pt. on medications Normal cardiovascular exam     Neuro/Psych Anxiety negative neurological ROS     GI/Hepatic negative GI ROS, Neg liver ROS,   Endo/Other  Hypothyroidism primary hyperparathyroidism  Renal/GU negative Renal ROS  negative genitourinary   Musculoskeletal  (+) Arthritis , Spinal cord stimulator   Abdominal   Peds  Hematology Hgb 15.3, plt 135k   Anesthesia Other Findings Day of surgery medications reviewed with patient.  Reproductive/Obstetrics negative OB ROS                           Anesthesia Physical Anesthesia Plan  ASA: II  Anesthesia Plan: General   Post-op Pain Management:    Induction: Intravenous  PONV Risk Score and Plan: 4 or greater and Treatment may vary due to age or medical condition, Ondansetron and Dexamethasone  Airway Management Planned: Oral ETT  Additional Equipment: None  Intra-op Plan:   Post-operative Plan: Extubation in OR  Informed Consent: I have reviewed the patients History and Physical, chart, labs and discussed the procedure including the risks, benefits and alternatives for the proposed anesthesia with the patient or authorized representative who has indicated his/her understanding and acceptance.     Dental advisory given  Plan  Discussed with: CRNA  Anesthesia Plan Comments:        Anesthesia Quick Evaluation

## 2020-04-27 ENCOUNTER — Other Ambulatory Visit: Payer: Self-pay

## 2020-04-27 ENCOUNTER — Ambulatory Visit (HOSPITAL_COMMUNITY): Payer: PPO | Admitting: Anesthesiology

## 2020-04-27 ENCOUNTER — Encounter (HOSPITAL_COMMUNITY)
Admission: RE | Disposition: A | Discharge status: Home / Self Care | Payer: Self-pay | Source: Ambulatory Visit | Attending: Surgery

## 2020-04-27 ENCOUNTER — Observation Stay (HOSPITAL_COMMUNITY)
Admission: RE | Admit: 2020-04-27 | Discharge: 2020-04-28 | Disposition: A | Discharge status: Home / Self Care | Payer: PPO | Source: Ambulatory Visit | Attending: Surgery | Admitting: Surgery

## 2020-04-27 ENCOUNTER — Encounter (HOSPITAL_COMMUNITY): Payer: Self-pay | Admitting: Surgery

## 2020-04-27 DIAGNOSIS — Z79899 Other long term (current) drug therapy: Secondary | ICD-10-CM | POA: Diagnosis not present

## 2020-04-27 DIAGNOSIS — R5383 Other fatigue: Secondary | ICD-10-CM | POA: Diagnosis not present

## 2020-04-27 DIAGNOSIS — I10 Essential (primary) hypertension: Secondary | ICD-10-CM | POA: Diagnosis not present

## 2020-04-27 DIAGNOSIS — E210 Primary hyperparathyroidism: Secondary | ICD-10-CM | POA: Diagnosis not present

## 2020-04-27 DIAGNOSIS — E213 Hyperparathyroidism, unspecified: Principal | ICD-10-CM | POA: Insufficient documentation

## 2020-04-27 DIAGNOSIS — F17200 Nicotine dependence, unspecified, uncomplicated: Secondary | ICD-10-CM | POA: Insufficient documentation

## 2020-04-27 DIAGNOSIS — Z7982 Long term (current) use of aspirin: Secondary | ICD-10-CM | POA: Diagnosis not present

## 2020-04-27 DIAGNOSIS — E039 Hypothyroidism, unspecified: Secondary | ICD-10-CM | POA: Diagnosis not present

## 2020-04-27 DIAGNOSIS — E892 Postprocedural hypoparathyroidism: Secondary | ICD-10-CM

## 2020-04-27 DIAGNOSIS — Z9089 Acquired absence of other organs: Secondary | ICD-10-CM

## 2020-04-27 DIAGNOSIS — G4730 Sleep apnea, unspecified: Secondary | ICD-10-CM | POA: Diagnosis not present

## 2020-04-27 HISTORY — PX: PARATHYROIDECTOMY: SHX19

## 2020-04-27 HISTORY — PX: THYROIDECTOMY: SHX17

## 2020-04-27 SURGERY — THYROIDECTOMY
Anesthesia: General | Site: Neck | Laterality: Right

## 2020-04-27 MED ORDER — CHLORHEXIDINE GLUCONATE CLOTH 2 % EX PADS
6.0000 | MEDICATED_PAD | Freq: Once | CUTANEOUS | Status: AC
  Administered 2020-04-27: 6 via TOPICAL

## 2020-04-27 MED ORDER — 0.9 % SODIUM CHLORIDE (POUR BTL) OPTIME
TOPICAL | Status: DC | PRN
  Administered 2020-04-27: 1000 mL

## 2020-04-27 MED ORDER — ACETAMINOPHEN 500 MG PO TABS: 1000.0000 mg | ORAL_TABLET | Freq: Once | ORAL | Status: DC

## 2020-04-27 MED ORDER — AMLODIPINE BESYLATE 10 MG PO TABS: 10.0000 mg | ORAL_TABLET | Freq: Every day | ORAL | Status: DC

## 2020-04-27 MED ORDER — TRAZODONE HCL 50 MG PO TABS
50.0000 mg | ORAL_TABLET | Freq: Every day | ORAL | Status: DC
  Administered 2020-04-27: 50 mg via ORAL
  Filled 2020-04-27: qty 1

## 2020-04-27 MED ORDER — ROPINIROLE HCL 0.5 MG PO TABS
0.5000 mg | ORAL_TABLET | Freq: Every day | ORAL | Status: DC
  Administered 2020-04-27: 0.5 mg via ORAL
  Filled 2020-04-27: qty 1

## 2020-04-27 MED ORDER — OXYCODONE HCL 5 MG PO TABS: 5.0000 mg | ORAL_TABLET | Freq: Once | ORAL | Status: DC | PRN

## 2020-04-27 MED ORDER — GABAPENTIN 300 MG PO CAPS
300.0000 mg | ORAL_CAPSULE | ORAL | Status: AC
  Administered 2020-04-27: 300 mg via ORAL
  Filled 2020-04-27: qty 1

## 2020-04-27 MED ORDER — CITALOPRAM HYDROBROMIDE 20 MG PO TABS
20.0000 mg | ORAL_TABLET | Freq: Every day | ORAL | Status: DC
  Administered 2020-04-27: 20 mg via ORAL
  Filled 2020-04-27: qty 1

## 2020-04-27 MED ORDER — ONDANSETRON 4 MG PO TBDP: 4.0000 mg | ORAL_TABLET | Freq: Four times a day (QID) | ORAL | Status: DC | PRN

## 2020-04-27 MED ORDER — FENTANYL CITRATE (PF) 100 MCG/2ML IJ SOLN
INTRAMUSCULAR | Status: DC | PRN
  Administered 2020-04-27 (×3): 50 ug via INTRAVENOUS
  Administered 2020-04-27: 100 ug via INTRAVENOUS

## 2020-04-27 MED ORDER — DEXAMETHASONE SODIUM PHOSPHATE 10 MG/ML IJ SOLN
INTRAMUSCULAR | Status: DC | PRN
  Administered 2020-04-27: 5 mg via INTRAVENOUS

## 2020-04-27 MED ORDER — SUGAMMADEX SODIUM 200 MG/2ML IV SOLN
INTRAVENOUS | Status: DC | PRN
  Administered 2020-04-27: 150 mg via INTRAVENOUS

## 2020-04-27 MED ORDER — METOPROLOL SUCCINATE ER 50 MG PO TB24: 50.0000 mg | ORAL_TABLET | Freq: Every day | ORAL | Status: DC

## 2020-04-27 MED ORDER — LEVOTHYROXINE SODIUM 125 MCG PO TABS
125.0000 ug | ORAL_TABLET | Freq: Every day | ORAL | Status: DC
  Administered 2020-04-28: 125 ug via ORAL
  Filled 2020-04-27: qty 1

## 2020-04-27 MED ORDER — OXYCODONE HCL 5 MG/5ML PO SOLN: 5.0000 mg | Freq: Once | ORAL | Status: DC | PRN

## 2020-04-27 MED ORDER — CEFAZOLIN SODIUM-DEXTROSE 2-4 GM/100ML-% IV SOLN
2.0000 g | INTRAVENOUS | Status: AC
  Administered 2020-04-27: 2 g via INTRAVENOUS
  Filled 2020-04-27: qty 100

## 2020-04-27 MED ORDER — ONDANSETRON HCL 4 MG/2ML IJ SOLN
INTRAMUSCULAR | Status: DC | PRN
  Administered 2020-04-27: 4 mg via INTRAVENOUS

## 2020-04-27 MED ORDER — PHENYLEPHRINE 40 MCG/ML (10ML) SYRINGE FOR IV PUSH (FOR BLOOD PRESSURE SUPPORT)
PREFILLED_SYRINGE | INTRAVENOUS | Status: DC | PRN
  Administered 2020-04-27 (×2): 80 ug via INTRAVENOUS

## 2020-04-27 MED ORDER — LIDOCAINE 2% (20 MG/ML) 5 ML SYRINGE
INTRAMUSCULAR | Status: DC | PRN
  Administered 2020-04-27: 80 mg via INTRAVENOUS

## 2020-04-27 MED ORDER — FENTANYL CITRATE (PF) 100 MCG/2ML IJ SOLN
25.0000 ug | INTRAMUSCULAR | Status: DC | PRN
  Administered 2020-04-27 (×2): 50 ug via INTRAVENOUS

## 2020-04-27 MED ORDER — PANTOPRAZOLE SODIUM 40 MG IV SOLR
40.0000 mg | Freq: Every day | INTRAVENOUS | Status: DC
  Administered 2020-04-27: 40 mg via INTRAVENOUS
  Filled 2020-04-27: qty 40

## 2020-04-27 MED ORDER — FENTANYL CITRATE (PF) 100 MCG/2ML IJ SOLN
INTRAMUSCULAR | Status: AC
  Filled 2020-04-27: qty 2

## 2020-04-27 MED ORDER — CHLORHEXIDINE GLUCONATE 0.12 % MT SOLN
15.0000 mL | Freq: Once | OROMUCOSAL | Status: AC
  Administered 2020-04-27: 15 mL via OROMUCOSAL

## 2020-04-27 MED ORDER — PROMETHAZINE HCL 25 MG/ML IJ SOLN: 6.2500 mg | INTRAMUSCULAR | Status: DC | PRN

## 2020-04-27 MED ORDER — KCL IN DEXTROSE-NACL 20-5-0.45 MEQ/L-%-% IV SOLN
INTRAVENOUS | Status: DC
  Filled 2020-04-27: qty 1000

## 2020-04-27 MED ORDER — AZELASTINE HCL 0.1 % NA SOLN
1.0000 | Freq: Every day | NASAL | Status: DC | PRN
  Filled 2020-04-27: qty 30

## 2020-04-27 MED ORDER — LACTATED RINGERS IV SOLN: INTRAVENOUS | Status: DC

## 2020-04-27 MED ORDER — ACETAMINOPHEN 500 MG PO TABS
1000.0000 mg | ORAL_TABLET | ORAL | Status: AC
  Administered 2020-04-27: 1000 mg via ORAL
  Filled 2020-04-27: qty 2

## 2020-04-27 MED ORDER — HYDROCODONE-ACETAMINOPHEN 5-325 MG PO TABS
1.0000 | ORAL_TABLET | ORAL | Status: DC | PRN
  Administered 2020-04-27 – 2020-04-28 (×3): 1 via ORAL
  Filled 2020-04-27 (×3): qty 1

## 2020-04-27 MED ORDER — ONDANSETRON HCL 4 MG/2ML IJ SOLN: 4.0000 mg | Freq: Four times a day (QID) | INTRAMUSCULAR | Status: DC | PRN

## 2020-04-27 MED ORDER — BUPIVACAINE HCL (PF) 0.25 % IJ SOLN
INTRAMUSCULAR | Status: DC | PRN
  Administered 2020-04-27: 7 mL

## 2020-04-27 MED ORDER — PROPOFOL 10 MG/ML IV BOLUS
INTRAVENOUS | Status: DC | PRN
  Administered 2020-04-27: 150 mg via INTRAVENOUS

## 2020-04-27 MED ORDER — ORAL CARE MOUTH RINSE: 15.0000 mL | Freq: Once | OROMUCOSAL | Status: AC

## 2020-04-27 MED ORDER — BUPIVACAINE HCL 0.25 % IJ SOLN
INTRAMUSCULAR | Status: AC
  Filled 2020-04-27: qty 1

## 2020-04-27 MED ORDER — EPHEDRINE SULFATE-NACL 50-0.9 MG/10ML-% IV SOSY
PREFILLED_SYRINGE | INTRAVENOUS | Status: DC | PRN
  Administered 2020-04-27 (×4): 10 mg via INTRAVENOUS

## 2020-04-27 MED ORDER — MONTELUKAST SODIUM 10 MG PO TABS
10.0000 mg | ORAL_TABLET | Freq: Every day | ORAL | Status: DC
  Administered 2020-04-27: 10 mg via ORAL
  Filled 2020-04-27: qty 1

## 2020-04-27 MED ORDER — SCOPOLAMINE 1 MG/3DAYS TD PT72
1.0000 | MEDICATED_PATCH | TRANSDERMAL | Status: DC
  Administered 2020-04-27: 1.5 mg via TRANSDERMAL
  Filled 2020-04-27: qty 1

## 2020-04-27 MED ORDER — FENTANYL CITRATE (PF) 250 MCG/5ML IJ SOLN
INTRAMUSCULAR | Status: AC
  Filled 2020-04-27: qty 5

## 2020-04-27 MED ORDER — METOPROLOL TARTRATE 5 MG/5ML IV SOLN: 5.0000 mg | Freq: Four times a day (QID) | INTRAVENOUS | Status: DC | PRN

## 2020-04-27 MED ORDER — ROCURONIUM BROMIDE 10 MG/ML (PF) SYRINGE
PREFILLED_SYRINGE | INTRAVENOUS | Status: DC | PRN
  Administered 2020-04-27: 50 mg via INTRAVENOUS

## 2020-04-27 MED ORDER — CHLORHEXIDINE GLUCONATE CLOTH 2 % EX PADS: 6.0000 | MEDICATED_PAD | Freq: Once | CUTANEOUS | Status: DC

## 2020-04-27 MED ORDER — ALPRAZOLAM 0.25 MG PO TABS
0.2500 mg | ORAL_TABLET | Freq: Every evening | ORAL | Status: DC | PRN
  Administered 2020-04-27: 0.25 mg via ORAL
  Filled 2020-04-27: qty 1

## 2020-04-27 SURGICAL SUPPLY — 34 items
ADH SKN CLS APL DERMABOND .7 (GAUZE/BANDAGES/DRESSINGS) ×1
APL SKNCLS STERI-STRIP NONHPOA (GAUZE/BANDAGES/DRESSINGS)
ATTRACTOMAT 16X20 MAGNETIC DRP (DRAPES) ×2 IMPLANT
BENZOIN TINCTURE PRP APPL 2/3 (GAUZE/BANDAGES/DRESSINGS) ×1 IMPLANT
BLADE HEX COATED 2.75 (ELECTRODE) ×2 IMPLANT
BLADE SURG 15 STRL LF DISP TIS (BLADE) ×1 IMPLANT
BLADE SURG 15 STRL SS (BLADE) ×2
CLIP VESOCCLUDE MED 6/CT (CLIP) ×4 IMPLANT
CLIP VESOCCLUDE SM WIDE 6/CT (CLIP) ×4 IMPLANT
COVER SURGICAL LIGHT HANDLE (MISCELLANEOUS) ×2 IMPLANT
COVER WAND RF STERILE (DRAPES) IMPLANT
DERMABOND ADVANCED (GAUZE/BANDAGES/DRESSINGS) ×1
DERMABOND ADVANCED .7 DNX12 (GAUZE/BANDAGES/DRESSINGS) IMPLANT
DISSECTOR ROUND CHERRY 3/8 STR (MISCELLANEOUS) ×2 IMPLANT
DRAPE LAPAROTOMY T 98X78 PEDS (DRAPES) ×2 IMPLANT
ELECT REM PT RETURN 15FT ADLT (MISCELLANEOUS) ×2 IMPLANT
GAUZE 4X4 16PLY RFD (DISPOSABLE) ×2 IMPLANT
GAUZE SPONGE 4X4 12PLY STRL (GAUZE/BANDAGES/DRESSINGS) IMPLANT
GLOVE BIOGEL M 8.0 STRL (GLOVE) ×2 IMPLANT
GOWN STRL REUS W/TWL XL LVL3 (GOWN DISPOSABLE) ×6 IMPLANT
HEMOSTAT SURGICEL 2X4 FIBR (HEMOSTASIS) ×2 IMPLANT
ILLUMINATOR WAVEGUIDE N/F (MISCELLANEOUS) ×1 IMPLANT
KIT BASIN OR (CUSTOM PROCEDURE TRAY) ×2 IMPLANT
KIT TURNOVER KIT A (KITS) IMPLANT
NS IRRIG 1000ML POUR BTL (IV SOLUTION) ×2 IMPLANT
PACK BASIC VI WITH GOWN DISP (CUSTOM PROCEDURE TRAY) ×2 IMPLANT
PENCIL SMOKE EVACUATOR (MISCELLANEOUS) ×1 IMPLANT
SHEARS HARMONIC 9CM CVD (BLADE) ×1 IMPLANT
STAPLER VISISTAT 35W (STAPLE) ×2 IMPLANT
SUT SILK 2 0 (SUTURE)
SUT SILK 2-0 18XBRD TIE 12 (SUTURE) ×1 IMPLANT
SUT VIC AB 4-0 SH 18 (SUTURE) ×1 IMPLANT
SYR BULB IRRIG 60ML STRL (SYRINGE) ×2 IMPLANT
YANKAUER SUCT BULB TIP 10FT TU (MISCELLANEOUS) ×2 IMPLANT

## 2020-04-27 NOTE — Anesthesia Procedure Notes (Signed)
Procedure Name: Intubation Performed by: Gean Maidens, CRNA Pre-anesthesia Checklist: Patient identified, Emergency Drugs available, Suction available, Patient being monitored and Timeout performed Patient Re-evaluated:Patient Re-evaluated prior to induction Oxygen Delivery Method: Circle system utilized Preoxygenation: Pre-oxygenation with 100% oxygen Induction Type: IV induction Ventilation: Mask ventilation without difficulty Laryngoscope Size: Mac and 3 Grade View: Grade I Tube type: Oral Tube size: 7.0 mm Number of attempts: 1 Airway Equipment and Method: Stylet Secured at: 21 cm Tube secured with: Tape Dental Injury: Teeth and Oropharynx as per pre-operative assessment

## 2020-04-27 NOTE — Interval H&P Note (Signed)
History and Physical Interval Note:  04/27/2020 8:58 AM  [name]  has presented today for surgery, with the diagnosis of primary hyperparathyroidism.  The various methods of treatment have been discussed with the patient and family. After consideration of risks, benefits and other options for treatment, the patient has consented to  Procedure(s): PARATHYROIDECTOMY (N/A) as a surgical intervention.  The patient's history has been reviewed, patient examined, no change in status, stable for surgery.  I have reviewed the patient's chart and labs.  Questions were answered to the patient's satisfaction.     Pedro Earls

## 2020-04-27 NOTE — Transfer of Care (Signed)
Immediate Anesthesia Transfer of Care Note  Patient: [name]  Procedure(s) Performed: PARATHYROIDECTOMY (Right Neck)  Patient Location: PACU  Anesthesia Type:General  Level of Consciousness: awake, alert  and oriented  Airway & Oxygen Therapy: Patient Spontanous Breathing and Patient connected to face mask oxygen  Post-op Assessment: Report given to RN and Post -op Vital signs reviewed and stable  Post vital signs: Reviewed and stable  Last Vitals:  Vitals Value Taken Time  BP 152/72 04/27/20 1104  Temp 36.4 C 04/27/20 1104  Pulse 87 04/27/20 1115  Resp 12 04/27/20 1115  SpO2 100 % 04/27/20 1115  Vitals shown include unvalidated device data.  Last Pain:  Vitals:   04/27/20 1104  TempSrc:   PainSc: Asleep         Complications: No complications documented.

## 2020-04-27 NOTE — Plan of Care (Signed)

## 2020-04-27 NOTE — Op Note (Signed)
[name]  [birth year]   04/27/2020    PCP:  Verl Bangs, FNP   Surgeon: Kaylyn Lim, MD, FACS  Asst:  Armandina Gemma, MD, FACS  Anes:  general  Preop Dx: Primary hyperparathyroidism Postop Dx: Same;   2.52 gram adenoma  Procedure: Right parathyroidectomy Location Surgery: [facility] 1 Complications: none  EBL:   minimal cc  Drains: none  Description of Procedure:  The patient was taken to OR 1 .  After anesthesia was administered and the patient was prepped  with chloroprep  and a timeout was performed.  A transverse incision was made to the right of midline over toward the sternocleidomastoid.  This was carried down through the platysma.  Platysma flaps were raised superiorly and inferiorly and I ended up extending the incision little more medially and laterally because of the depth of her neck.  We retracted the strap muscles laterally and found a very diminutive thyroid gland and the anatomy was affected by that.  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  The carotid was palpable and seen in the vessels nerves were localized on the right including the recurrent nerve.  We worked around those and very posteriorly behind the right carotid there was a right parathyroid gland consistent with an adenoma that was probably centimeter and 1/2 to 2 cm in greatest dimension..  It was sent for pathologic examination and the weight was reported by Dr. Tresa Moore as 2.52 g.  The area was explored did not measure and no bleeding was seen.  We put some fibrillar into the depths of the incision.  The strap muscles were approximated in the midline and the platysma was closed with 4-0 Vicryl.  The wound was closed with running subcuticular 4-0 Monocryl and Dermabond.  The patient tolerated the procedure well and was taken to the PACU in stable condition.     Matt B. Hassell Done, [place], [facility], [place]

## 2020-04-27 NOTE — Anesthesia Postprocedure Evaluation (Signed)
Anesthesia Post Note  Patient: [name]  Procedure(s) Performed: PARATHYROIDECTOMY (Right Neck)     Patient location during evaluation: PACU Anesthesia Type: General Level of consciousness: awake and alert and oriented Pain management: pain level controlled Vital Signs Assessment: post-procedure vital signs reviewed and stable Respiratory status: spontaneous breathing, nonlabored ventilation and respiratory function stable Cardiovascular status: blood pressure returned to baseline Postop Assessment: no apparent nausea or vomiting Anesthetic complications: no   No complications documented.  Last Vitals:  Vitals:   04/27/20 1243 04/27/20 1353  BP: (!) 146/76 121/71  Pulse: 76 71  Resp: 16 16  Temp:  36.7 C  SpO2: 98% 97%    Last Pain:  Vitals:   04/27/20 1407  TempSrc:   PainSc: [place]

## 2020-04-28 ENCOUNTER — Encounter (HOSPITAL_COMMUNITY): Payer: Self-pay | Admitting: Surgery

## 2020-04-28 DIAGNOSIS — E213 Hyperparathyroidism, unspecified: Secondary | ICD-10-CM | POA: Diagnosis not present

## 2020-04-28 LAB — BASIC METABOLIC PANEL
Anion gap: 13 (ref 5–15)
BUN: 11 mg/dL (ref 8–23)
CO2: 20 mmol/L — ABNORMAL LOW (ref 22–32)
Calcium: 9.4 mg/dL (ref 8.9–10.3)
Chloride: 106 mmol/L (ref 98–111)
Creatinine, Ser: 0.6 mg/dL (ref 0.44–1.00)
GFR calc Af Amer: >60 mL/min (ref >60)
GFR calc non Af Amer: >60 mL/min (ref >60)
Glucose, Bld: 112 mg/dL — ABNORMAL HIGH (ref 70–99)
Potassium: 3.8 mmol/L (ref 3.5–5.1)
Sodium: 139 mmol/L (ref 135–145)

## 2020-04-28 MED ORDER — TRAMADOL HCL 50 MG PO TABS: 50.0000 mg | ORAL_TABLET | Freq: Four times a day (QID) | ORAL | 0 refills | Status: DC | PRN

## 2020-04-28 NOTE — Discharge Instructions (Signed)
Hypercalcemia Hypercalcemia is when the level of calcium in a person's blood is above normal. The body needs calcium to make bones and keep them strong. Calcium also helps the muscles, nerves, brain, and heart work the way they should. Most of the calcium in the body is in the bones. There is also some calcium in the blood. Hypercalcemia can happen when calcium comes out of the bones, or when the kidneys are not able to remove calcium from the blood. Hypercalcemia can be mild or severe. What are the causes? There are many possible causes of hypercalcemia. Common causes of this condition include:  Hyperparathyroidism. This is a condition in which the body produces too much parathyroid hormone. There are four parathyroid glands in your neck. These glands produce a chemical messenger (hormone) that helps the body absorb calcium from foods and helps your bones release calcium.  Certain kinds of cancer. Less common causes of hypercalcemia include:  Getting too much calcium or vitamin D from your diet.  Kidney failure.  Hyperthyroidism.  Severe dehydration.  Being on bed rest or being inactive for a long time.  Certain medicines.  Infections. What increases the risk? You are more likely to develop this condition if you:  Are female.  Are 60 years of age or older.  Have a family history of hypercalcemia. What are the signs or symptoms? Mild hypercalcemia that starts slowly may not cause symptoms. Severe, sudden hypercalcemia is more likely to cause symptoms, such as:  Being more thirsty than usual.  Needing to urinate more often than usual.  Abdominal pain.  Nausea and vomiting.  Constipation.  Muscle pain, twitching, or weakness.  Feeling very tired. How is this diagnosed?  Hypercalcemia is usually diagnosed with a blood test. You may also have tests to help determine what is causing this condition, such as imaging tests and more blood tests. How is this  treated? Treatment for hypercalcemia depends on the cause. Treatment may include:  Receiving fluids through an IV.  Medicines that: ? Keep calcium levels steady after receiving fluids (loop diuretics). ? Keep calcium in your bones (bisphosphonates). ? Lower the calcium level in your blood.  Surgery to remove overactive parathyroid glands.  A procedure that filters your blood to correct calcium levels (hemodialysis). Follow these instructions at home:   Take over-the-counter and prescription medicines only as told by your health care provider.  Follow instructions from your health care provider about eating or drinking restrictions.  Drink enough fluid to keep your urine pale yellow.  Stay active. Weight-bearing exercise helps to keep calcium in your bones. Follow instructions from your health care provider about what type and level of exercise is safe for you.  Keep all follow-up visits as told by your health care provider. This is important. Contact a health care provider if you have:  A fever.  A heartbeat that is irregular or very fast.  Changes in mood, memory, or personality. Get help right away if you:  Have severe abdominal pain.  Have chest pain.  Have trouble breathing.  Become very confused and sleepy.  Lose consciousness. Summary  Hypercalcemia is when the level of calcium in a person's blood is above normal. The body needs calcium to make bones and keep them strong. Calcium also helps the muscles, nerves, brain, and heart work the way they should.  There are many possible causes of hypercalcemia, and treatment depends on the cause.  Take over-the-counter and prescription medicines only as told by your health care   provider.  Follow instructions from your health care provider about eating or drinking restrictions. This information is not intended to replace advice given to you by your health care provider. Make sure you discuss any questions you have with  your health care provider. Document Revised: 10/05/2018 Document Reviewed: 06/14/2018 Elsevier Patient Education  2020 [employer]  

## 2020-04-28 NOTE — Progress Notes (Signed)
D/c patient home now husband at bedside. All VS, Pain, Labs are stable. D/C now.

## 2020-04-28 NOTE — Plan of Care (Signed)

## 2020-04-28 NOTE — Discharge Summary (Signed)
Physician Discharge Summary [facility]  Patient ID: [name] MRN: [id number] DOB/AGE: Jan 04, 1941 79 y.o.  Admit date: 04/27/2020 Discharge date: 04/28/2020  Admission Diagnoses:  Primary hyperparathyroidism  Discharge Diagnoses:  Active Problems:   S/P parathyroidectomy   Discharged Condition: good  Hospital Course: Patient was admitted for observation following parathyroid surgery.  Post op course was uncomplicated.  Pain was well controlled.  Tolerated diet.  Post op calcium level on morning following surgery was 9.4 mg/dl.  Patient was prepared for discharge home on POD#1.  Consults: None  Treatments: surgery: right parathyroidectomy  Discharge Exam: Blood pressure 138/72, pulse 61, temperature 98.6 F (37 C), resp. rate 15, height 5\' 4"  (1.626 m), weight 76.1 kg, SpO2 94 %. HEENT - clear Neck - wound dry and intact; mild STS; voice hoarse, no stridor Chest - clear bilaterally Cor - RRR   Disposition: Home  Discharge Instructions    Diet - low sodium heart healthy   Complete by: As directed    Discharge instructions   Complete by: As directed    [facility]  THYROID & PARATHYROID SURGERY:  POST-OP INSTRUCTIONS  Always review your discharge instruction sheet from the facility where your surgery was performed.  A prescription for pain medication may be given to you upon discharge.  Take your pain medication as prescribed.  If narcotic pain medicine is not needed, then you may take acetaminophen (Tylenol) or ibuprofen (Advil) as needed.  Take your usually prescribed medications unless otherwise directed.  If you need a refill on your pain medication, please contact our office during regular business hours.  Prescriptions cannot be processed by our office after 5 pm or on weekends.  Start with a light diet upon arrival home, such as soup and crackers or toast.  Be sure to drink plenty of fluids daily.  Resume your  normal diet the day after surgery.  Most patients will experience some swelling and bruising on the chest and neck area.  Ice packs will help.  Swelling and bruising can take several days to resolve.   It is common to experience some constipation after surgery.  Increasing fluid intake and taking a stool softener (Colace) will usually help or prevent this problem.  A mild laxative (Milk of Magnesia or Miralax) should be taken according to package directions if there has been no bowel movement after 48 hours.  You have steri-strips and a gauze dressing over your incision.  You may remove the gauze bandage on the second day after surgery, and you may shower at that time.  Leave your steri-strips (small skin tapes) in place directly over the incision.  These strips should remain on the skin for 5-7 days and then be removed.  You may get them wet in the shower and pat them dry.  You may resume regular (light) daily activities beginning the next day (such as daily self-care, walking, climbing stairs) gradually increasing activities as tolerated.  You may have sexual intercourse when it is comfortable.  Refrain from any heavy lifting or straining until approved by your doctor.  You may drive when you no longer are taking prescription pain medication, you can comfortably wear a seatbelt, and you can safely maneuver your car and apply brakes.  You should see your doctor in the office for a follow-up appointment approximately three weeks after your surgery.  Make sure that you call for this appointment within a day or two after you arrive home  to insure a convenient appointment time.  WHEN TO CALL YOUR DOCTOR: -- Fever greater than 101.5 -- Inability to urinate -- Nausea and/or vomiting - persistent -- Extreme swelling or bruising -- Continued bleeding from incision -- Increased pain, redness, or drainage from the incision -- Difficulty swallowing or breathing -- Muscle cramping or spasms -- Numbness or  tingling in hands or around lips  The clinic staff is available to answer your questions during regular business hours.  Please don't hesitate to call and ask to speak to one of the nurses if you have concerns.  Armandina Gemma, MD [facility] Office: [phone]   Ice pack   Complete by: As directed    Increase activity slowly   Complete by: As directed    No dressing needed   Complete by: As directed      Allergies as of 04/28/2020      Reactions   Codeine Nausea Only   Tolerates oxycodone   Morphine Nausea And Vomiting   Ace Inhibitors Cough   Adhesive [tape] Other (See Comments), Rash   whelps      Medication List    TAKE these medications   acetaminophen 500 MG tablet Commonly known as: TYLENOL Take 500 mg by mouth every 6 (six) hours as needed for moderate pain.   ALPRAZolam 0.25 MG tablet Commonly known as: XANAX Take 1 tablet (0.25 mg total) by mouth at bedtime as needed for anxiety or sleep.   amLODipine 10 MG tablet Commonly known as: NORVASC TAKE 1 TABLET(10 MG) BY MOUTH DAILY What changed: See the new instructions.   aspirin EC 81 MG tablet Take 81 mg by mouth every other day.   Azelastine HCl 0.15 % Soln Place 1 spray into both nostrils daily as needed (allergies).   cetirizine 10 MG tablet Commonly known as: ZYRTEC Take 1 tablet (10 mg total) by mouth daily. What changed:   when to take this  reasons to take this   citalopram 20 MG tablet Commonly known as: CELEXA TAKE 1 TABLET(20 MG) BY MOUTH AT BEDTIME What changed:   how much to take  how to take this  when to take this  additional instructions   furosemide 20 MG tablet Commonly known as: LASIX TAKE 1 TABLET(20 MG) BY MOUTH EVERY MORNING   gemfibrozil 600 MG tablet Commonly known as: LOPID TAKE 1 TABLET BY MOUTH 2 TIMES DAILY BEFORE A MEAL What changed:   how much to take  how to take this  when to take this  additional instructions   levothyroxine  125 MCG tablet Commonly known as: SYNTHROID TAKE 1 TABLET(125 MCG) BY MOUTH DAILY   losartan 50 MG tablet Commonly known as: COZAAR TAKE 1 TABLET(50 MG) BY MOUTH DAILY What changed: See the new instructions.   metoprolol succinate 50 MG 24 hr tablet Commonly known as: TOPROL-XL TAKE 1 TABLET(50 MG) BY MOUTH EVERY MORNING What changed: See the new instructions.   montelukast 10 MG tablet Commonly known as: SINGULAIR TAKE 1 TABLET(10 MG) BY MOUTH DAILY What changed: See the new instructions.   multivitamin with minerals Tabs tablet Take 1 tablet by mouth daily. Centrum Silver Multivitamin   rOPINIRole 0.5 MG tablet Commonly known as: REQUIP TAKE 1 TABLET BY MOUTH EVERY NIGHT   simvastatin 40 MG tablet Commonly known as: ZOCOR TAKE 1 TABLET(40 MG) BY MOUTH AT BEDTIME What changed: See the new instructions.   traMADol 50 MG tablet Commonly known as: ULTRAM Take 1-2 tablets (50-100  mg total) by mouth every 6 (six) hours as needed.   traZODone 50 MG tablet Commonly known as: DESYREL TAKE 1 TABLET(50 MG) BY MOUTH AT BEDTIME What changed: See the new instructions.            Discharge Care Instructions  (From admission, onward)         Start     Ordered   04/28/20 0000  No dressing needed        04/28/20 7493           Earnstine Regal, MD, [facility] Office: [phone]   Signed: Armandina Gemma 04/28/2020, 8:24 AM

## 2020-04-30 LAB — SURGICAL PATHOLOGY

## 2020-05-15 DIAGNOSIS — E892 Postprocedural hypoparathyroidism: Secondary | ICD-10-CM | POA: Diagnosis not present

## 2020-05-22 ENCOUNTER — Other Ambulatory Visit: Payer: Self-pay | Admitting: Family Medicine

## 2020-05-22 DIAGNOSIS — I10 Essential (primary) hypertension: Secondary | ICD-10-CM

## 2020-05-23 DIAGNOSIS — I10 Essential (primary) hypertension: Secondary | ICD-10-CM | POA: Diagnosis not present

## 2020-05-30 ENCOUNTER — Encounter: Payer: Self-pay | Admitting: Ophthalmology

## 2020-05-30 ENCOUNTER — Other Ambulatory Visit: Payer: Self-pay | Admitting: Family Medicine

## 2020-05-30 DIAGNOSIS — J302 Other seasonal allergic rhinitis: Secondary | ICD-10-CM

## 2020-06-04 ENCOUNTER — Ambulatory Visit (INDEPENDENT_AMBULATORY_CARE_PROVIDER_SITE_OTHER): Payer: PPO | Admitting: Family Medicine

## 2020-06-04 ENCOUNTER — Encounter: Payer: Self-pay | Admitting: Family Medicine

## 2020-06-04 ENCOUNTER — Other Ambulatory Visit: Payer: Self-pay

## 2020-06-04 VITALS — BP 128/62 | HR 63 | Temp 98.2°F | Resp 18 | Ht 64.0 in | Wt 163.6 lb

## 2020-06-04 DIAGNOSIS — J302 Other seasonal allergic rhinitis: Secondary | ICD-10-CM | POA: Diagnosis not present

## 2020-06-04 DIAGNOSIS — I10 Essential (primary) hypertension: Secondary | ICD-10-CM

## 2020-06-04 DIAGNOSIS — R5383 Other fatigue: Secondary | ICD-10-CM

## 2020-06-04 MED ORDER — CETIRIZINE HCL 10 MG PO TABS: ORAL_TABLET | ORAL | 1 refills | Status: DC

## 2020-06-04 MED ORDER — AMLODIPINE BESYLATE 10 MG PO TABS: ORAL_TABLET | ORAL | 1 refills | Status: DC

## 2020-06-04 NOTE — Assessment & Plan Note (Signed)
Fatigue has resolved s/p surgery on her parathyroid.  Denies any concerns.

## 2020-06-04 NOTE — Progress Notes (Signed)
Subjective:    Patient ID: [name], female    DOB: Dec 02, 1940, 79 y.o.   MRN: [id number]  [name] is a 79 y.o. female presenting on 06/04/2020 for Hypertension   HPI   Ms. Leiterman is here today for a follow up on her hypertension, seasonal allergies, and fatigue.  Hypertension - She is not checking BP at home or outside of clinic.    - Current medications: amlodipine 10mg  daily, losartan 50mg  daily, metoprolol succinate 50mg  daily, tolerating well without side effects - She is not currently symptomatic. - Pt denies headache, lightheadedness, dizziness, changes in vision, chest tightness/pressure, palpitations, leg swelling, sudden loss of speech or loss of consciousness. - She  reports no regular exercise routine. - Her diet is high in salt, high in fat, and high in carbohydrates.  Depression screen [facility] 2/9 10/12/2019 06/06/2019 03/15/2019  Decreased Interest 1 0 0  Down, Depressed, Hopeless 1 0 0  PHQ - 2 Score 2 0 0  Altered sleeping 0 - -  Tired, decreased energy 3 - -  Change in appetite 0 - -  Feeling bad or failure about yourself  0 - -  Trouble concentrating 0 - -  Moving slowly or fidgety/restless 0 - -  Suicidal thoughts 0 - -  PHQ-9 Score 5 - -  Difficult doing work/chores Not difficult at all - -    Social History   Tobacco Use  . Smoking status: Current Every Day Smoker    Packs/day: 0.50    Years: 40.00    Pack years: 20.00    Types: Cigarettes  . Smokeless tobacco: Never Used  Vaping Use  . Vaping Use: Never used  Substance Use Topics  . Alcohol use: No  . Drug use: No    Review of Systems  Constitutional: Negative.   HENT: Negative.   Eyes: Negative.   Respiratory: Negative.   Cardiovascular: Negative.   Gastrointestinal: Negative.   Endocrine: Negative.   Genitourinary: Negative.   Musculoskeletal: Negative.   Skin: Negative.   Allergic/Immunologic: Negative.   Neurological: Negative.   Hematological: Negative.     Psychiatric/Behavioral: Negative.    Per HPI unless specifically indicated above     Objective:    BP 128/62 (BP Location: Left Arm, Patient Position: Sitting, Cuff Size: Normal)   Pulse 63   Temp 98.2 F (36.8 C) (Oral)   Resp 18   Ht 5\' 4"  (1.626 m)   Wt 163 lb 9.6 oz (74.2 kg)   LMP  (LMP Unknown)   SpO2 100%   BMI 28.08 kg/m   Wt Readings from Last 3 Encounters:  06/04/20 163 lb 9.6 oz (74.2 kg)  04/27/20 167 lb 12.3 oz (76.1 kg)  04/20/20 167 lb 11.2 oz (76.1 kg)    Physical Exam Vitals reviewed.  Constitutional:      General: She is not in acute distress.    Appearance: Normal appearance. She is well-developed, well-groomed and overweight. She is not ill-appearing or toxic-appearing.  HENT:     Head: Normocephalic and atraumatic.     Nose:     Comments: [name] is in place, covering mouth and nose. Eyes:     General: Lids are normal. Vision grossly intact.        Right eye: No discharge.        Left eye: No discharge.     Extraocular Movements: Extraocular movements intact.     Conjunctiva/sclera: Conjunctivae normal.     Pupils: Pupils are  equal, round, and reactive to light.  Cardiovascular:     Rate and Rhythm: Normal rate and regular rhythm.     Pulses: Normal pulses.          Dorsalis pedis pulses are 2+ on the right side and 2+ on the left side.     Heart sounds: Normal heart sounds. No murmur heard.  No friction rub. No gallop.   Pulmonary:     Effort: Pulmonary effort is normal. No respiratory distress.     Breath sounds: Normal breath sounds.  Musculoskeletal:     Right lower leg: No edema.     Left lower leg: No edema.  Skin:    General: Skin is warm and dry.     Capillary Refill: Capillary refill takes less than 2 seconds.  Neurological:     General: No focal deficit present.     Mental Status: She is alert and oriented to person, place, and time.  Psychiatric:        Attention and Perception: Attention and perception normal.        Mood  and Affect: Mood and affect normal.        Speech: Speech normal.        Behavior: Behavior normal. Behavior is cooperative.        Thought Content: Thought content normal.        Cognition and Memory: Cognition and memory normal.        Judgment: Judgment normal.    Results for orders placed or performed during the hospital encounter of 49/44/96  Basic metabolic panel  Result Value Ref Range   Sodium 139 135 - 145 mmol/L   Potassium 3.8 3.5 - 5.1 mmol/L   Chloride 106 98 - 111 mmol/L   CO2 20 (L) 22 - 32 mmol/L   Glucose, Bld 112 (H) 70 - 99 mg/dL   BUN 11 8 - 23 mg/dL   Creatinine, Ser 0.60 0.44 - 1.00 mg/dL   Calcium 9.4 8.9 - 10.3 mg/dL   GFR calc non Af Amer >60 >60 mL/min   GFR calc Af Amer >60 >60 mL/min   Anion gap 13 5 - 15  Surgical pathology  Result Value Ref Range   SURGICAL PATHOLOGY      SURGICAL PATHOLOGY CASE: [id number] PATIENT: [name] Surgical Pathology Report     Clinical History: Primary hyperparathyroidism (crm)     FINAL MICROSCOPIC DIAGNOSIS:  A. 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  Right parathyroid adenoma: "Hypercellular parathyroid (2.529 g)" Intraoperative diagnosis rendered by Dr. Tresa Moore at 10:51 AM on 04/27/2020.  GROSS DESCRIPTION:  The specimen is received fresh for rapid intraoperative consultation, and consists of a 2.529 g, 2.3 x 1.6 x 1.3 cm nodule of tan-red soft tissue.  The specimen is trisected, and representative portion is submitted for frozen section analysis.  The tissue is entirely submitted in 3 cassettes. 1 = frozen section remnant 2 and 3 = remainder specimen Craig Staggers 04/30/2020)   Final Diagnosis performed by Jaquita Folds, MD.   Electronically signed 04/30/2020 Technical and  / or Professional components performed at [facility] [address] [place],  [place] [address].  Immunohistochemistry Technical component (if applicable) was performed at [facility]. [address] [place], [place], [place] [address].   IMMUNOHISTOCHEMISTRY DISCLAIMER (if applicable): Some of these immunohistochemical stains may have been developed and the performance characteristics determine  by [facility]. Some may not have been cleared or approved by the U.S. Food and Drug Administration. The FDA has determined that such clearance or approval is not necessary. This test is used for clinical purposes. It should not be regarded as investigational or for research. This laboratory is certified under the [place] (CLIA-88) as qualified to perform high complexity clinical laboratory testing.  The controls stained appropriately.       Assessment & Plan:   Problem List Items Addressed This Visit      Cardiovascular and Mediastinum   Hypertension    Stable and controlled hypertension.  BP is at goal < 130/80.  Pt is not working on lifestyle modifications.  Taking medications tolerating well without side effects.  Complications:  Overweight, HLD, hypothyroidism  Plan: 1. Continue taking amlodipine 10mg  daily, losartan 50mg  daily, and metoprolol succinate 50mg  daily 2. Obtain labs in 6 months  3. Encouraged heart healthy diet and increasing exercise to 30 minutes most days of the week, going no more than 2 days in a row without exercise. 4. Check BP 1-2 x per week at home, keep log, and bring to clinic at next appointment. 5. Follow up 6 months.         Relevant Medications   amLODipine (NORVASC) 10 MG tablet     Other   Fatigue - Primary    Fatigue has resolved s/p surgery on her parathyroid.  Denies any concerns.      Seasonal allergies    Stable and well controlled with singulair 10mg  daily and cetirizine 10mg  daily.  Plan: 1. Continue singulair 10mg  and cetirizine 10mg  daily 2. RTC  in 6 months, or sooner, should the need arise      Relevant Medications   cetirizine (ZYRTEC) 10 MG tablet      Meds ordered this encounter  Medications  . amLODipine (NORVASC) 10 MG tablet    Sig: Take 1 tablet daily    Dispense:  90 tablet    Refill:  1  . cetirizine (ZYRTEC) 10 MG tablet    Sig: TAKE 1 TABLET(10 MG) BY MOUTH DAILY    Dispense:  90 tablet    Refill:  1    Follow up plan: Return in about 6 months (around 12/02/2020) for HTN F/U.   Harlin Rain, [place] [facility] Nurse Practitioner [facility] 06/04/2020, 10:31 AM

## 2020-06-04 NOTE — Patient Instructions (Signed)
Your medication refills have been sent to your pharmacy on file.  Try to get exercise a minimum of 30 minutes per day at least 5 days per week as well as  adequate water intake all while measuring blood pressure a few times per week.  Keep a blood pressure log and bring back to clinic at your next visit.  If your readings are consistently over 130/80 to contact our office/send me a MyChart message and we will see you sooner.  Can try DASH and Mediterranean diet options, avoiding processed foods, lowering sodium intake, avoiding pork products, and eating a plant based diet for optimal health.  We will plan to see you back in 6 months for hypertension follow up visit  You will receive a survey after today's visit either digitally by e-mail or paper by [facility] mail. Your experiences and feedback matter to [place].  Please respond so we know how we are doing as we provide care for you.  Call us with any questions/concerns/needs.  It is my goal to be available to you for your health concerns.  Thanks for choosing me to be a partner in your healthcare needs!  Harlin Rain, FNP-C Family Nurse Practitioner [facility] Phone: [phone]

## 2020-06-04 NOTE — Assessment & Plan Note (Signed)
Stable and controlled hypertension.  BP is at goal < 130/80.  Pt is not working on lifestyle modifications.  Taking medications tolerating well without side effects.  Complications:  Overweight, HLD, hypothyroidism  Plan: 1. Continue taking amlodipine 10mg  daily, losartan 50mg  daily, and metoprolol succinate 50mg  daily 2. Obtain labs in 6 months  3. Encouraged heart healthy diet and increasing exercise to 30 minutes most days of the week, going no more than 2 days in a row without exercise. 4. Check BP 1-2 x per week at home, keep log, and bring to clinic at next appointment. 5. Follow up 6 months.

## 2020-06-04 NOTE — Assessment & Plan Note (Signed)
Stable and well controlled with singulair 10mg  daily and cetirizine 10mg  daily.  Plan: 1. Continue singulair 10mg  and cetirizine 10mg  daily 2. RTC in 6 months, or sooner, should the need arise

## 2020-06-06 ENCOUNTER — Other Ambulatory Visit: Payer: Self-pay

## 2020-06-06 ENCOUNTER — Other Ambulatory Visit
Admission: RE | Admit: 2020-06-06 | Discharge: 2020-06-06 | Disposition: A | Discharge status: Home / Self Care | Payer: PPO | Source: Ambulatory Visit | Attending: Ophthalmology | Admitting: Ophthalmology

## 2020-06-06 DIAGNOSIS — Z20822 Contact with and (suspected) exposure to covid-19: Secondary | ICD-10-CM | POA: Insufficient documentation

## 2020-06-06 DIAGNOSIS — Z01812 Encounter for preprocedural laboratory examination: Secondary | ICD-10-CM | POA: Insufficient documentation

## 2020-06-06 NOTE — Discharge Instructions (Signed)
INSTRUCTIONS FOLLOWING OCULOPLASTIC SURGERY AMY M. FOWLER, MD  AFTER YOUR EYE SURGERY, THER ARE MANY THINGS WHICH YOU, THE PATIENT, CAN DO TO ASSURE THE BEST POSSIBLE RESULT FROM YOUR OPERATION.  THIS SHEET SHOULD BE REFERRED TO WHENEVER QUESTIONS ARISE.  IF THERE ARE ANY QUESTIONS NOT ANSWERED HERE, DO NOT HESITATE TO CALL OUR OFFICE AT [phone] OR [phone].  THERE IS ALWAYS SOMEONE AVAILABLE TO CALL IF QUESTIONS OR PROBLEMS ARISE.  VISION: Your vision may be blurred and out of focus after surgery until you are able to stop using your ointment, swelling resolves and your eye(s) heal. This may take 1 to 2 weeks at the least.  If your vision becomes gradually more dim or dark, this is not normal and you need to call our office immediately.  EYE CARE: For the first 48 hours after surgery, use ice packs frequently - "20 minutes on, 20 minutes off" - to help reduce swelling and bruising.  Small bags of frozen peas or corn make good ice packs along with cloths soaked in ice water.  If you are wearing a patch or other type of dressing following surgery, keep this on for the amount of time specified by your doctor.  For the first week following surgery, you will need to treat your stitches with great care.  It is OK to shower, but take care to not allow soapy water to run into your eye(s) to help reduce chances of infection.  You may gently clean the eyelashes and around the eye(s) with cotton balls and sterile water, BUT DO NOT RUB THE STITCHES VIGOROUSLY.  Keeping your stitches moist with ointment will help promote healing with minimal scar formation.  ACTIVITY: When you leave the surgery center, you should go home, rest and be inactive.  The eye(s) may feel scratchy and keeping the eyes closed will allow for faster healing.  The first week following surgery, avoid straining (anything making the face turn red) or lifting over 20 pounds.  Additionally, avoid bending which causes your head to go below  your waist.  Using your eyes will NOT harm them, so feel free to read, watch television, use the computer, etc as desired.  Driving depends on each individual, so check with your doctor if you have questions about driving. Do not wear contact lenses for about 2 weeks.  Do not wear eye makeup for 2 weeks.  Avoid swimming, hot tubs, gardening, and dusting for 1 to 2 weeks to reduce the risk of an infection.  MEDICATIONS:  You will be given a prescription for an ointment to use 4 times a day on your stitches.  You can use the ointment in your eyes if they feel scratchy or irritated.  If you eyelid(s) don't close completely when you sleep, put some ointment in your eyes before bedtime.  EMERGENCY: If you experience SEVERE EYE PAIN OR HEADACHE UNRELIEVED BY TYLENOL OR TRAMADOL, NAUSEA OR VOMITING, WORSENING REDNESS, OR WORSENING VISION (ESPECIALLY VISION THAT WAS INITIALLY BETTER) CALL [phone] OR [phone] DURING BUSINESS HOURS OR AFTER HOURS.  General Anesthesia, Adult, Care After This sheet gives you information about how to care for yourself after your procedure. Your health care provider may also give you more specific instructions. If you have problems or questions, contact your health care provider. What can I expect after the procedure? After the procedure, the following side effects are common:  Pain or discomfort at the IV site.  Nausea.  Vomiting.  Sore throat.  Trouble concentrating.    Feeling cold or chills.  Weak or tired.  Sleepiness and fatigue.  Soreness and body aches. These side effects can affect parts of the body that were not involved in surgery. Follow these instructions at home:  For at least 24 hours after the procedure:  Have a responsible adult stay with you. It is important to have someone help care for you until you are awake and alert.  Rest as needed.  Do not: ? Participate in activities in which you could fall or become injured. ? Drive. ? Use  heavy machinery. ? Drink alcohol. ? Take sleeping pills or medicines that cause drowsiness. ? Make important decisions or sign legal documents. ? Take care of children on your own. Eating and drinking  Follow any instructions from your health care provider about eating or drinking restrictions.  When you feel hungry, start by eating small amounts of foods that are soft and easy to digest (bland), such as toast. Gradually return to your regular diet.  Drink enough fluid to keep your urine pale yellow.  If you vomit, rehydrate by drinking water, juice, or clear broth. General instructions  If you have sleep apnea, surgery and certain medicines can increase your risk for breathing problems. Follow instructions from your health care provider about wearing your sleep device: ? Anytime you are sleeping, including during daytime naps. ? While taking prescription pain medicines, sleeping medicines, or medicines that make you drowsy.  Return to your normal activities as told by your health care provider. Ask your health care provider what activities are safe for you.  Take over-the-counter and prescription medicines only as told by your health care provider.  If you smoke, do not smoke without supervision.  Keep all follow-up visits as told by your health care provider. This is important. Contact a health care provider if:  You have nausea or vomiting that does not get better with medicine.  You cannot eat or drink without vomiting.  You have pain that does not get better with medicine.  You are unable to pass urine.  You develop a skin rash.  You have a fever.  You have redness around your IV site that gets worse. Get help right away if:  You have difficulty breathing.  You have chest pain.  You have blood in your urine or stool, or you vomit blood. Summary  After the procedure, it is common to have a sore throat or nausea. It is also common to feel tired.  Have a  responsible adult stay with you for the first 24 hours after general anesthesia. It is important to have someone help care for you until you are awake and alert.  When you feel hungry, start by eating small amounts of foods that are soft and easy to digest (bland), such as toast. Gradually return to your regular diet.  Drink enough fluid to keep your urine pale yellow.  Return to your normal activities as told by your health care provider. Ask your health care provider what activities are safe for you. This information is not intended to replace advice given to you by your health care provider. Make sure you discuss any questions you have with your health care provider. Document Revised: 09/11/2017 Document Reviewed: 04/24/2017 Elsevier Patient Education  2020 [employer]  

## 2020-06-07 LAB — SARS CORONAVIRUS 2 (TAT 6-24 HRS): SARS Coronavirus 2: NEGATIVE

## 2020-06-08 ENCOUNTER — Ambulatory Visit: Payer: PPO | Admitting: Anesthesiology

## 2020-06-08 ENCOUNTER — Encounter
Admission: RE | Disposition: A | Discharge status: Home / Self Care | Payer: Self-pay | Source: Home / Self Care | Attending: Ophthalmology

## 2020-06-08 ENCOUNTER — Ambulatory Visit
Admission: RE | Admit: 2020-06-08 | Discharge: 2020-06-08 | Disposition: A | Discharge status: Home / Self Care | Payer: PPO | Attending: Ophthalmology | Admitting: Ophthalmology

## 2020-06-08 ENCOUNTER — Encounter: Payer: Self-pay | Admitting: Ophthalmology

## 2020-06-08 ENCOUNTER — Other Ambulatory Visit: Payer: Self-pay

## 2020-06-08 DIAGNOSIS — I10 Essential (primary) hypertension: Secondary | ICD-10-CM | POA: Diagnosis not present

## 2020-06-08 DIAGNOSIS — H02045 Spastic entropion of left lower eyelid: Secondary | ICD-10-CM | POA: Insufficient documentation

## 2020-06-08 DIAGNOSIS — G4730 Sleep apnea, unspecified: Secondary | ICD-10-CM | POA: Insufficient documentation

## 2020-06-08 DIAGNOSIS — E890 Postprocedural hypothyroidism: Secondary | ICD-10-CM | POA: Diagnosis not present

## 2020-06-08 DIAGNOSIS — Z885 Allergy status to narcotic agent status: Secondary | ICD-10-CM | POA: Diagnosis not present

## 2020-06-08 DIAGNOSIS — F17200 Nicotine dependence, unspecified, uncomplicated: Secondary | ICD-10-CM | POA: Diagnosis not present

## 2020-06-08 DIAGNOSIS — H02035 Senile entropion of left lower eyelid: Secondary | ICD-10-CM | POA: Diagnosis not present

## 2020-06-08 HISTORY — PX: ENTROPIAN REPAIR: SHX1512

## 2020-06-08 SURGERY — REPAIR, ENTROPION
Anesthesia: Monitor Anesthesia Care | Site: Eye | Laterality: Left

## 2020-06-08 MED ORDER — LIDOCAINE-EPINEPHRINE 2 %-1:100000 IJ SOLN
INTRAMUSCULAR | Status: DC | PRN
  Administered 2020-06-08: 3 mL via OPHTHALMIC

## 2020-06-08 MED ORDER — ERYTHROMYCIN 5 MG/GM OP OINT: TOPICAL_OINTMENT | OPHTHALMIC | 2 refills | Status: DC

## 2020-06-08 MED ORDER — TETRACAINE HCL 0.5 % OP SOLN
OPHTHALMIC | Status: DC | PRN
  Administered 2020-06-08: 1 [drp] via OPHTHALMIC

## 2020-06-08 MED ORDER — LIDOCAINE HCL (CARDIAC) PF 100 MG/5ML IV SOSY
PREFILLED_SYRINGE | INTRAVENOUS | Status: DC | PRN
  Administered 2020-06-08: 10 mg via INTRAVENOUS

## 2020-06-08 MED ORDER — ERYTHROMYCIN 5 MG/GM OP OINT
TOPICAL_OINTMENT | OPHTHALMIC | Status: DC | PRN
  Administered 2020-06-08: 1 via OPHTHALMIC

## 2020-06-08 MED ORDER — ALFENTANIL 500 MCG/ML IJ INJ
INJECTION | INTRAVENOUS | Status: DC | PRN
Start: 2020-06-08 — End: 2020-06-08
  Administered 2020-06-08: 250 ug via INTRAVENOUS
  Administered 2020-06-08: 500 ug via INTRAVENOUS

## 2020-06-08 MED ORDER — MIDAZOLAM HCL 2 MG/2ML IJ SOLN
INTRAMUSCULAR | Status: DC | PRN
  Administered 2020-06-08 (×2): 1 mg via INTRAVENOUS

## 2020-06-08 MED ORDER — PROPOFOL 500 MG/50ML IV EMUL
INTRAVENOUS | Status: DC | PRN
  Administered 2020-06-08: 50 ug/kg/min via INTRAVENOUS
  Administered 2020-06-08: 20 mg via INTRAVENOUS

## 2020-06-08 MED ORDER — LACTATED RINGERS IV SOLN: INTRAVENOUS | Status: DC

## 2020-06-08 MED ORDER — BSS IO SOLN
INTRAOCULAR | Status: DC | PRN
  Administered 2020-06-08: 15 mL

## 2020-06-08 MED ORDER — TRAMADOL HCL 50 MG PO TABS: ORAL_TABLET | ORAL | 0 refills | Status: DC

## 2020-06-08 SURGICAL SUPPLY — 26 items
APPLICATOR COTTON TIP WD 3 STR (MISCELLANEOUS) ×4 IMPLANT
BLADE SURG 15 STRL LF DISP TIS (BLADE) ×1 IMPLANT
BLADE SURG 15 STRL SS (BLADE) ×2
CORD BIP STRL DISP 12FT (MISCELLANEOUS) ×2 IMPLANT
DRAPE HEAD BAR (DRAPES) ×2 IMPLANT
GAUZE SPONGE 4X4 12PLY STRL (GAUZE/BANDAGES/DRESSINGS) ×2 IMPLANT
GLOVE SURG LX 7.0 MICRO (GLOVE) ×2
GLOVE SURG LX STRL 7.0 MICRO (GLOVE) ×2 IMPLANT
MARKER SKIN XFINE TIP W/RULER (MISCELLANEOUS) ×2 IMPLANT
NDL FILTER BLUNT 18X1 1/2 (NEEDLE) ×1 IMPLANT
NDL HYPO 30X.5 LL (NEEDLE) ×2 IMPLANT
NEEDLE FILTER BLUNT 18X 1/2SAF (NEEDLE) ×1
NEEDLE FILTER BLUNT 18X1 1/2 (NEEDLE) ×1 IMPLANT
NEEDLE HYPO 30X.5 LL (NEEDLE) ×4 IMPLANT
PACK ENT CUSTOM (PACKS) ×2 IMPLANT
SOL PREP PVP 2OZ (MISCELLANEOUS) ×2
SOLUTION PREP PVP 2OZ (MISCELLANEOUS) ×1 IMPLANT
SPONGE GAUZE 2X2 8PLY STRL LF (GAUZE/BANDAGES/DRESSINGS) ×15 IMPLANT
SUT CHROMIC 4-0 (SUTURE) ×4
SUT CHROMIC 4-0 M2 12X2 ARM (SUTURE) ×2
SUT GUT PLAIN 6-0 1X18 ABS (SUTURE) ×2 IMPLANT
SUT MERSILENE 4-0 S-2 (SUTURE) ×2 IMPLANT
SUTURE CHRMC 4-0 M2 12X2 ARM (SUTURE) IMPLANT
SYR 10ML LL (SYRINGE) ×2 IMPLANT
SYR 3ML LL SCALE MARK (SYRINGE) ×2 IMPLANT
WATER STERILE IRR 250ML POUR (IV SOLUTION) ×2 IMPLANT

## 2020-06-08 NOTE — H&P (Signed)
See the history and physical completed at [facility] on 05/23/20 and scanned into the chart.

## 2020-06-08 NOTE — Interval H&P Note (Signed)
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  The various methods of treatment have been discussed with the patient and family. After consideration of risks, benefits and other options for treatment, the patient has consented to  Procedure(s): ENTROPION REPAIR, SUTURES ENTROPION REPAIR, EXTENSIVE LEFT (Left) as a surgical intervention.  The patient's history has been reviewed, patient examined, no change in status, stable for surgery.  I have reviewed the patient's chart and labs.  Questions were answered to the patient's satisfaction.     Vickki Muff, Raylee Adamec M

## 2020-06-08 NOTE — Anesthesia Procedure Notes (Signed)
Procedure Name: MAC Performed by: Georga Bora, CRNA Pre-anesthesia Checklist: Patient identified, Emergency Drugs available, Suction available, Patient being monitored and Timeout performed Patient Re-evaluated:Patient Re-evaluated prior to induction Oxygen Delivery Method: Nasal cannula Placement Confirmation: positive ETCO2

## 2020-06-08 NOTE — Transfer of Care (Signed)
Immediate Anesthesia Transfer of Care Note  Patient: [name]  Procedure(s) Performed: ENTROPION REPAIR, SUTURES ENTROPION REPAIR, EXTENSIVE LEFT (Left Eye)  Patient Location: PACU  Anesthesia Type: MAC  Level of Consciousness: awake, alert  and patient cooperative  Airway and Oxygen Therapy: Patient Spontanous Breathing and Patient connected to supplemental oxygen  Post-op Assessment: Post-op Vital signs reviewed, Patient's Cardiovascular Status Stable, Respiratory Function Stable, Patent Airway and No signs of Nausea or vomiting  Post-op Vital Signs: Reviewed and stable  Complications: No complications documented.

## 2020-06-08 NOTE — Op Note (Signed)
Preoperative Diagnosis:   1.  Lower eyelid laxity with entropion, left  lower eyelid(s). 2.  Spastic Entropion left  Lower eyelid(s)  Postoperative Diagnosis:   Same.  Procedure(s) Performed:  1.  Lateral tarsal strip procedure,  left   lower eyelid(s). 2.  Quickert Sutures for entropion left  lower eyelid(s)  Surgeon: Philis Pique. Vickki Muff, M.D.  Assistants: none  Anesthesia: MAC  Specimens: None.  Estimated Blood Loss: Minimal.  Complications: None.  Operative Findings: None   Procedure:   Allergies were reviewed and the patient Codeine, Morphine, Ace inhibitors, and Adhesive [tape].    After discussing the risks, benefits, complications, and alternatives with the patient, appropriate informed consent was obtained. The patient was brought to the operating suite and reclined supine. Time out was conducted and the patient was sedated.  Local anesthetic consisting of a 50-50 mixture of 2% lidocaine with epinephrine and 0.75% bupivacaine with added Hylenex was injected subcutaneously to the left  lateral canthal region(s) and lower eyelid(s). Additional anesthetic was injected subconjunctivally to the left  lower eyelid(s). Finally, anesthetic was injected down to the periosteum of the left  lateral orbital rim(s).  After adequate local was instilled, the patient was prepped and draped in the usual sterile fashion for eyelid surgery. Attention was turned to the left  lateral canthal angle. Westcott scissors were used to create a lateral canthotomy. Hemostasis was obtained with bipolar cautery. An inferior cantholysis was then performed with additional bipolar hemostasis. The anterior and posterior lamella of the lid were divided for approximately 8 mm.  A strip of the epithelium was excised off the superior margin of the tarsal strip and conjunctiva and retractors were incised off the inferior margin of the tarsal strip.   Two double-armed 4-0 chromic sutures were passed medially and  laterally through the lower lid from the conjunctival fornix, anteriorly through the skin just below the border of the tarsal plate. Each of these sutures was tied off and this created a nice outward rolling of the lid margin.  A double-armed 4-0 M[place]ilene suture was then passed each arm through the terminal portion of the tarsal strip. Each arm of the suture was then passed through the periosteum of the inner portion of the lateral orbital rim at the level of Whitnall's tubercle. The sutures were advanced and this provided nice elevation and tightening of the lower eyelid. Once the suture was secured, a thin strip of follicle-bearing skin was excised. The lateral canthal angle was reformed with an interrupted 6-0 plain gut suture. Orbicularis was reapproximated with horizontal subcuticular 6-0 plain gut sutures. The skin was closed with interrupted 6-0 plain gut sutures.   Attention was then turned to the opposite eyelid where the same procedure was performed in the same manner.   The patient tolerated the procedure well. Erythromycin ophthalmic ointment was applied to the incision site(s) followed by ice packs. The patient was taken to the recovery area where she recovered without difficulty.  Post-Op Plan/Instructions:   The patient was instructed to use ice packs frequently for the next 48 hours. She was instructed to use Erythromycin ophthalmic ointment on her incisions 4 times a day for the next 12 to 14 days. She was given a prescription for tramadol (or similar) for pain control should Tylenol not be effective. She was asked to to follow up at the [facility] in [place], [place] in 3-4 weeks' time or sooner as needed for problems.   Shatika Grinnell M. Vickki Muff, M.D. Ophthalmology

## 2020-06-08 NOTE — Anesthesia Postprocedure Evaluation (Signed)
Anesthesia Post Note  Patient: [name]  Procedure(s) Performed: ENTROPION REPAIR, SUTURES ENTROPION REPAIR, EXTENSIVE LEFT (Left Eye)     Patient location during evaluation: PACU Anesthesia Type: MAC Level of consciousness: awake and alert Pain management: pain level controlled Vital Signs Assessment: post-procedure vital signs reviewed and stable Respiratory status: spontaneous breathing, nonlabored ventilation, respiratory function stable and patient connected to nasal cannula oxygen Cardiovascular status: stable and blood pressure returned to baseline Postop Assessment: no apparent nausea or vomiting Anesthetic complications: no   No complications documented.  Fidel Levy

## 2020-06-08 NOTE — Anesthesia Preprocedure Evaluation (Signed)
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  Reproductive/Obstetrics                             Anesthesia Physical Anesthesia Plan  ASA: II  Anesthesia Plan: MAC   Post-op Pain Management:    Induction:   PONV Risk Score and Plan: 2  Airway Management Planned:   Additional Equipment:   Intra-op Plan:   Post-operative Plan:   Informed Consent: I have reviewed the patients History and Physical, chart, labs and discussed the procedure including the risks, benefits and alternatives for the proposed anesthesia with the patient or authorized representative who has indicated his/her understanding and acceptance.       Plan Discussed with: CRNA  Anesthesia Plan Comments:         Anesthesia Quick Evaluation

## 2020-06-11 ENCOUNTER — Encounter: Payer: Self-pay | Admitting: Ophthalmology

## 2020-06-19 ENCOUNTER — Ambulatory Visit (INDEPENDENT_AMBULATORY_CARE_PROVIDER_SITE_OTHER): Payer: PPO

## 2020-06-19 VITALS — Ht 64.0 in | Wt 163.0 lb

## 2020-06-19 DIAGNOSIS — Z0000 Encounter for general adult medical examination without abnormal findings: Secondary | ICD-10-CM | POA: Diagnosis not present

## 2020-06-19 NOTE — Patient Instructions (Signed)
Ms. Akerley , Thank you for taking time to come for your Medicare Wellness Visit. I appreciate your ongoing commitment to your health goals. Please review the following plan we discussed and let me know if I can assist you in the future.   Screening recommendations/referrals: Colonoscopy: not required Mammogram: completed 11/08/2019 Bone Density: ordered 01/27/2020 Recommended yearly ophthalmology/optometry visit for glaucoma screening and checkup Recommended yearly dental visit for hygiene and checkup  Vaccinations: Influenza vaccine: completed 04/22/2020 Pneumococcal vaccine: completed 08/05/2018 Tdap vaccine: due Shingles vaccine: completed   Covid-19: 11/28/2019, 10/31/2019  Advanced directives: Please bring a copy of your POA (Power of Attorney) and/or Living Will to your next appointment.   Conditions/risks identified: smoking  Next appointment: Follow up in one year for your annual wellness visit    Preventive Care 11 Years and Older, Female Preventive care refers to lifestyle choices and visits with your health care provider that can promote health and wellness. What does preventive care include?  A yearly physical exam. This is also called an annual well check.  Dental exams once or twice a year.  Routine eye exams. Ask your health care provider how often you should have your eyes checked.  Personal lifestyle choices, including:  Daily care of your teeth and gums.  Regular physical activity.  Eating a healthy diet.  Avoiding tobacco and drug use.  Limiting alcohol use.  Practicing safe sex.  Taking low-dose aspirin every day.  Taking vitamin and mineral supplements as recommended by your health care provider. What happens during an annual well check? The services and screenings done by your health care provider during your annual well check will depend on your age, overall health, lifestyle risk factors, and family history of disease. Counseling  Your health care  provider may ask you questions about your:  Alcohol use.  Tobacco use.  Drug use.  Emotional well-being.  Home and relationship well-being.  Sexual activity.  Eating habits.  History of falls.  Memory and ability to understand (cognition).  Work and work Statistician.  Reproductive health. Screening  You may have the following tests or measurements:  Height, weight, and BMI.  Blood pressure.  Lipid and cholesterol levels. These may be checked every 5 years, or more frequently if you are over 45 years old.  Skin check.  Lung cancer screening. You may have this screening every year starting at age 15 if you have a 30-pack-year history of smoking and currently smoke or have quit within the past 15 years.  Fecal occult blood test (FOBT) of the stool. You may have this test every year starting at age 76.  Flexible sigmoidoscopy or colonoscopy. You may have a sigmoidoscopy every 5 years or a colonoscopy every 10 years starting at age 36.  Hepatitis C blood test.  Hepatitis B blood test.  Sexually transmitted disease (STD) testing.  Diabetes screening. This is done by checking your blood sugar (glucose) after you have not eaten for a while (fasting). You may have this done every 1-3 years.  Bone density scan. This is done to screen for osteoporosis. You may have this done starting at age 21.  Mammogram. This may be done every 1-2 years. Talk to your health care provider about how often you should have regular mammograms. Talk with your health care provider about your test results, treatment options, and if necessary, the need for more tests. Vaccines  Your health care provider may recommend certain vaccines, such as:  Influenza vaccine. This is recommended every year.  Tetanus, diphtheria, and acellular pertussis (Tdap, Td) vaccine. You may need a Td booster every 10 years.  Zoster vaccine. You may need this after age 69.  Pneumococcal 13-valent conjugate (PCV13)  vaccine. One dose is recommended after age 46.  Pneumococcal polysaccharide (PPSV23) vaccine. One dose is recommended after age 18. Talk to your health care provider about which screenings and vaccines you need and how often you need them. This information is not intended to replace advice given to you by your health care provider. Make sure you discuss any questions you have with your health care provider. Document Released: 10/05/2015 Document Revised: 05/28/2016 Document Reviewed: 07/10/2015 Elsevier Interactive Patient Education  2017 [place] Prevention in the Home Falls can cause injuries. They can happen to people of all ages. There are many things you can do to make your home safe and to help prevent falls. What can I do on the outside of my home?  Regularly fix the edges of walkways and driveways and fix any cracks.  Remove anything that might make you trip as you walk through a door, such as a raised step or threshold.  Trim any bushes or trees on the path to your home.  Use bright outdoor lighting.  Clear any walking paths of anything that might make someone trip, such as rocks or tools.  Regularly check to see if handrails are loose or broken. Make sure that both sides of any steps have handrails.  Any raised decks and porches should have guardrails on the edges.  Have any leaves, snow, or ice cleared regularly.  Use sand or salt on walking paths during winter.  Clean up any spills in your garage right away. This includes oil or grease spills. What can I do in the bathroom?  Use night lights.  Install grab bars by the toilet and in the tub and shower. Do not use towel bars as grab bars.  Use non-skid mats or decals in the tub or shower.  If you need to sit down in the shower, use a plastic, non-slip stool.  Keep the floor dry. Clean up any water that spills on the floor as soon as it happens.  Remove soap buildup in the tub or shower  regularly.  Attach bath mats securely with double-sided non-slip rug tape.  Do not have throw rugs and other things on the floor that can make you trip. What can I do in the bedroom?  Use night lights.  Make sure that you have a light by your bed that is easy to reach.  Do not use any sheets or blankets that are too big for your bed. They should not hang down onto the floor.  Have a firm chair that has side arms. You can use this for support while you get dressed.  Do not have throw rugs and other things on the floor that can make you trip. What can I do in the kitchen?  Clean up any spills right away.  Avoid walking on wet floors.  Keep items that you use a lot in easy-to-reach places.  If you need to reach something above you, use a strong step stool that has a grab bar.  Keep electrical cords out of the way.  Do not use floor polish or wax that makes floors slippery. If you must use wax, use non-skid floor wax.  Do not have throw rugs and other things on the floor that can make you trip. What can I do  with my stairs?  Do not leave any items on the stairs.  Make sure that there are handrails on both sides of the stairs and use them. Fix handrails that are broken or loose. Make sure that handrails are as long as the stairways.  Check any carpeting to make sure that it is firmly attached to the stairs. Fix any carpet that is loose or worn.  Avoid having throw rugs at the top or bottom of the stairs. If you do have throw rugs, attach them to the floor with carpet tape.  Make sure that you have a light switch at the top of the stairs and the bottom of the stairs. If you do not have them, ask someone to add them for you. What else can I do to help prevent falls?  Wear shoes that:  Do not have high heels.  Have rubber bottoms.  Are comfortable and fit you well.  Are closed at the toe. Do not wear sandals.  If you use a stepladder:  Make sure that it is fully  opened. Do not climb a closed stepladder.  Make sure that both sides of the stepladder are locked into place.  Ask someone to hold it for you, if possible.  Clearly mark and make sure that you can see:  Any grab bars or handrails.  First and last steps.  Where the edge of each step is.  Use tools that help you move around (mobility aids) if they are needed. These include:  Canes.  Walkers.  Scooters.  Crutches.  Turn on the lights when you go into a dark area. Replace any light bulbs as soon as they burn out.  Set up your furniture so you have a clear path. Avoid moving your furniture around.  If any of your floors are uneven, fix them.  If there are any pets around you, be aware of where they are.  Review your medicines with your doctor. Some medicines can make you feel dizzy. This can increase your chance of falling. Ask your doctor what other things that you can do to help prevent falls. This information is not intended to replace advice given to you by your health care provider. Make sure you discuss any questions you have with your health care provider. Document Released: 07/05/2009 Document Revised: 02/14/2016 Document Reviewed: 10/13/2014 Elsevier Interactive Patient Education  2017 [employer].

## 2020-06-19 NOTE — Progress Notes (Signed)
I connected with [name] today by telephone and verified that I am speaking with the correct person using two identifiers. Location patient: home Location provider: work Persons participating in the virtual visit: [name], Glenna Durand LPN.   I discussed the limitations, risks, security and privacy concerns of performing an evaluation and management service by telephone and the availability of in person appointments. I also discussed with the patient that there may be a patient responsible charge related to this service. The patient expressed understanding and verbally consented to this telephonic visit.    Interactive audio and video telecommunications were attempted between this provider and patient, however failed, due to patient having technical difficulties OR patient did not have access to video capability.  We continued and completed visit with audio only.     Vital signs may be patient reported or missing.  Subjective:   [name] is a 79 y.o. female who presents for Medicare Annual (Subsequent) preventive examination.  Review of Systems     Cardiac Risk Factors include: advanced age (>70men, >17 women);hypertension;sedentary lifestyle     Objective:    Today's Vitals   06/19/20 1317  Weight: 163 lb (73.9 kg)  Height: 5\' 4"  (1.626 m)   Body mass index is 27.98 kg/m.  Advanced Directives 06/19/2020 06/08/2020 04/27/2020 04/20/2020 06/23/2019 06/21/2019 03/15/2019  Does Patient Have a Medical Advance Directive? Yes Yes Yes Yes Yes Yes Yes  Type of Paramedic of [place];Living will [place];Living will Healthcare Power of [place];Living will [place]  Does patient want to make changes to medical advance directive? - No - Patient declined No - Patient declined - - - -  Copy of [place] in Chart? No -  copy requested No - copy requested No - copy requested - No - copy requested - Yes - validated most recent copy scanned in chart (See row information)    Current Medications (verified) Outpatient Encounter Medications as of 06/19/2020  Medication Sig  . acetaminophen (TYLENOL) 500 MG tablet Take 500 mg by mouth every 6 (six) hours as needed for moderate pain.   Marland Kitchen ALPRAZolam (XANAX) 0.25 MG tablet Take 1 tablet (0.25 mg total) by mouth at bedtime as needed for anxiety or sleep.  Marland Kitchen amLODipine (NORVASC) 10 MG tablet Take 1 tablet daily  . aspirin EC 81 MG tablet Take 81 mg by mouth every other day.   . Azelastine HCl 0.15 % SOLN Place 1 spray into both nostrils daily as needed (allergies).   . cetirizine (ZYRTEC) 10 MG tablet TAKE 1 TABLET(10 MG) BY MOUTH DAILY  . citalopram (CELEXA) 20 MG tablet TAKE 1 TABLET(20 MG) BY MOUTH AT BEDTIME (Patient taking differently: Take 20 mg by mouth at bedtime. )  . erythromycin ophthalmic ointment Apply to sutures 4 times a day for 10-12 days.  Discontinue if allergy develops and call our office  . furosemide (LASIX) 20 MG tablet TAKE 1 TABLET(20 MG) BY MOUTH EVERY MORNING  . gemfibrozil (LOPID) 600 MG tablet TAKE 1 TABLET BY MOUTH 2 TIMES DAILY BEFORE A MEAL (Patient taking differently: Take 600 mg by mouth 2 (two) times daily before a meal. )  . levothyroxine (SYNTHROID) 125 MCG tablet TAKE 1 TABLET(125 MCG) BY MOUTH DAILY  . losartan (COZAAR) 50 MG tablet TAKE 1 TABLET(50 MG) BY MOUTH DAILY (Patient taking differently: Take 50 mg by mouth daily. )  .  metoprolol succinate (TOPROL-XL) 50 MG 24 hr tablet TAKE 1 TABLET(50 MG) BY MOUTH EVERY MORNING (Patient taking differently: Take 50 mg by mouth daily. )  . montelukast (SINGULAIR) 10 MG tablet TAKE 1 TABLET(10 MG) BY MOUTH DAILY (Patient taking differently: Take 10 mg by mouth daily. )  . Multiple Vitamin (MULTIVITAMIN WITH MINERALS) TABS tablet Take 1 tablet by mouth daily. Centrum Silver Multivitamin  .  rOPINIRole (REQUIP) 0.5 MG tablet TAKE 1 TABLET BY MOUTH EVERY NIGHT (Patient taking differently: Take 0.5 mg by mouth at bedtime. )  . simvastatin (ZOCOR) 40 MG tablet TAKE 1 TABLET(40 MG) BY MOUTH AT BEDTIME (Patient taking differently: Take 40 mg by mouth at bedtime. )  . traMADol (ULTRAM) 50 MG tablet Take 1-2 tablets (50-100 mg total) by mouth every 6 (six) hours as needed.  . traMADol (ULTRAM) 50 MG tablet Take 1 every 4-6 hours as needed for pain not controlled by Tylenol  . traZODone (DESYREL) 50 MG tablet TAKE 1 TABLET(50 MG) BY MOUTH AT BEDTIME (Patient taking differently: Take 50 mg by mouth at bedtime. )   No facility-administered encounter medications on file as of 06/19/2020.    Allergies (verified) Codeine, Morphine, Ace inhibitors, and Adhesive [tape]   History: Past Medical History:  Diagnosis Date  . Allergy   . Anemia   . Anxiety   . Cancer ([place])    skin cancer  . Hyperlipidemia   . Hypertension   . Hypothyroidism   . Sciatica   . Sleep apnea    does not use cpap  . Thyroid disease    Past Surgical History:  Procedure Laterality Date  . ABDOMINAL HYSTERECTOMY    . APPENDECTOMY    . BACK SURGERY     lower  . BILATERAL CARPAL TUNNEL RELEASE    . BREAST CYST ASPIRATION Left    neg  . COLONOSCOPY    . ENTROPIAN REPAIR Left 06/08/2020   Procedure: ENTROPION REPAIR, SUTURES ENTROPION REPAIR, EXTENSIVE LEFT;  Surgeon: Karle Starch, MD;  Location: [place];  Service: Ophthalmology;  Laterality: Left;  . JOINT REPLACEMENT    . KNEE SURGERY Bilateral   . KNEE SURGERY    . OPEN REDUCTION INTERNAL FIXATION (ORIF) DISTAL RADIAL FRACTURE Left 06/23/2019   Procedure: OPEN REDUCTION INTERNAL FIXATION (ORIF) DISTAL RADIAL FRACTURE;  Surgeon: Hessie Knows, MD;  Location: [facility];  Service: Orthopedics;  Laterality: Left;  . PARATHYROIDECTOMY  04/27/2020  . SPINAL CORD STIMULATOR IMPLANT Right   . THYROIDECTOMY Right 04/27/2020   Procedure: PARATHYROIDECTOMY;   Surgeon: Johnathan Hausen, MD;  Location: [facility];  Service: General;  Laterality: Right;  . 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 Breast cancer Paternal Aunt   . Dementia Mother   . Dementia Sister   . Heart attack Brother   . Diabetes Brother   . Heart attack Son   . Hypercalcemia Neg Hx    Social History   Socioeconomic History  . Marital status: Married    Spouse name: Not on file  . Number of children: Not on file  . Years of education: Not on file  . Highest education level: Not on file  Occupational History  . Occupation: retired  Tobacco Use  . Smoking status: Current Every Day Smoker    Packs/day: 0.50  Years: 40.00    Pack years: 20.00    Types: Cigarettes  . Smokeless tobacco: Never Used  Vaping Use  . Vaping Use: Never used  Substance and Sexual Activity  . Alcohol use: No  . Drug use: No  . Sexual activity: Not Currently  Other Topics Concern  . Not on file  Social History Narrative  . Not on file   Social Determinants of Health   Financial Resource Strain: Low Risk   . Difficulty of Paying Living Expenses: Not hard at all  Food Insecurity: No Food Insecurity  . Worried About Charity fundraiser in the Last Year: Never true  . Ran Out of Food in the Last Year: Never true  Transportation Needs: No Transportation Needs  . Lack of Transportation (Medical): No  . Lack of Transportation (Non-Medical): No  Physical Activity: Inactive  . Days of Exercise per Week: 0 days  . Minutes of Exercise per Session: 0 min  Stress: No Stress Concern Present  . Feeling of Stress : Not at all  Social Connections:   . Frequency of Communication with Friends and Family: Not on file  . Frequency of Social Gatherings with Friends and Family: Not on file  . Attends Religious Services: Not on file    . Active Member of Clubs or Organizations: Not on file  . Attends Archivist Meetings: Not on file  . Marital Status: Not on file    Tobacco Counseling Ready to quit: No Counseling given: Not Answered   Clinical Intake:  Pre-visit preparation completed: Yes  Pain : No/denies pain     Nutritional Status: BMI 25 -29 Overweight Nutritional Risks: None Diabetes: No  How often do you need to have someone help you when you read instructions, pamphlets, or other written materials from your doctor or pharmacy?: 1 - Never  Diabetic? no  Interpreter Needed?: No  Information entered by :: NAllen LPN   Activities of Daily Living In your present state of health, do you have any difficulty performing the following activities: 06/19/2020 06/08/2020  Hearing? N N  Vision? N N  Difficulty concentrating or making decisions? N N  Walking or climbing stairs? N N  Dressing or bathing? N N  Doing errands, shopping? N -  Preparing Food and eating ? N -  Using the Toilet? N -  In the past six months, have you accidently leaked urine? N -  Do you have problems with loss of bowel control? N -  Managing your Medications? N -  Managing your Finances? N -  Housekeeping or managing your Housekeeping? N -  Some recent data might be hidden    Patient Care Team: [place], Lupita Raider, FNP as PCP - General (Family Medicine)  Indicate any recent Medical Services you may have received from other than [facility] providers in the past year (date may be approximate).     Assessment:   This is a routine wellness examination for Andra.  Hearing/Vision screen  Hearing Screening   125Hz  250Hz  500Hz  1000Hz  2000Hz  3000Hz  4000Hz  6000Hz  8000Hz   Right ear:           Left ear:           Vision Screening Comments: Regular eye exams, [facility]  Dietary issues and exercise activities discussed: Current Exercise Habits: The patient does not participate in regular exercise at present  Goals    . Patient  Stated     06/19/2020, no goals    . Quit  Smoking      Depression Screen PHQ 2/9 Scores 06/19/2020 10/12/2019 06/06/2019 03/15/2019 02/04/2019 10/28/2018 12/17/2017  PHQ - 2 Score 0 2 0 0 0 0 0  PHQ- 9 Score - 5 - - 4 1 -    Fall Risk Fall Risk  06/19/2020 06/04/2020 03/15/2019 12/17/2017 12/07/2017  Falls in the past year? 1 1 0 No No  Comment feet got tangled - - - -  Number falls in past yr: 0 0 - - -  Injury with Fall? 1 1 - - -  Comment broke left arm - - - -  Risk for fall due to : History of fall(s);Medication side effect No Fall Risks - - -  Follow up Falls evaluation completed;Education provided;Falls prevention discussed Falls evaluation completed - - -    Any stairs in or around the home? Yes  If so, are there any without handrails? No  Home free of loose throw rugs in walkways, pet beds, electrical cords, etc? Yes  Adequate lighting in your home to reduce risk of falls? Yes   ASSISTIVE DEVICES UTILIZED TO PREVENT FALLS:  Life alert? No  Use of a cane, walker or w/c? No  Grab bars in the bathroom? No  Shower chair or bench in shower? Yes  Elevated toilet seat or a handicapped toilet? Yes   TIMED UP AND GO:  Was the test performed? No .      Cognitive Function:     6CIT Screen 06/19/2020  What Year? 0 points  What month? 0 points  What time? 0 points  Count back from 20 0 points  Months in reverse 0 points  Repeat phrase 0 points  Total Score 0    Immunizations Immunization History  Administered Date(s) Administered  . Fluad Quad(high Dose 65+) 06/06/2019  . Influenza, High Dose Seasonal PF 06/04/2017, 06/29/2018  . Influenza-Unspecified 04/22/2020  . PFIZER SARS-COV-2 Vaccination 10/31/2019, 11/28/2019  . Pneumococcal Conjugate-13 08/05/2018  . Pneumococcal Polysaccharide-23 06/04/2017    TDAP status: Due, Education has been provided regarding the importance of this vaccine. Advised may receive this vaccine at local pharmacy or Health Dept. Aware to  provide a copy of the vaccination record if obtained from local pharmacy or Health Dept. Verbalized acceptance and understanding. Flu Vaccine status: Up to date Pneumococcal vaccine status: Up to date Covid-19 vaccine status: Declined, Education has been provided regarding the importance of this vaccine but patient still declined. Advised may receive this vaccine at local pharmacy or Health Dept.or vaccine clinic. Aware to provide a copy of the vaccination record if obtained from local pharmacy or Health Dept. Verbalized acceptance and understanding.  Qualifies for Shingles Vaccine? Yes   Zostavax completed No   Shingrix Completed?: Yes  Screening Tests Health Maintenance  Topic Date Due  . Hepatitis C Screening  Never done  . TETANUS/TDAP  Never done  . DEXA SCAN  Never done  . INFLUENZA VACCINE  Completed  . COVID-19 Vaccine  Completed  . PNA vac Low Risk Adult  Completed    Health Maintenance  Health Maintenance Due  Topic Date Due  . Hepatitis C Screening  Never done  . TETANUS/TDAP  Never done  . DEXA SCAN  Never done    Colorectal cancer screening: No longer required.  Mammogram status: Completed 11/08/2019. Repeat every year Bone Density status: Ordered 01/27/2020. Pt provided with contact info and advised to call to schedule appt.  Lung Cancer Screening: (Low Dose CT Chest recommended if Age 37-80 years,  30 pack-year currently smoking OR have quit w/in 15years.) does not qualify.   Lung Cancer Screening Referral: no   Additional Screening:  Hepatitis C Screening: does qualify;   Vision Screening: Recommended annual ophthalmology exams for early detection of glaucoma and other disorders of the eye. Is the patient up to date with their annual eye exam?  Yes  Who is the provider or what is the name of the office in which the patient attends annual eye exams? [facility] If pt is not established with a provider, would they like to be referred to a provider to establish care?  No .   Dental Screening: Recommended annual dental exams for proper oral hygiene  Community Resource Referral / Chronic Care Management: CRR required this visit?  No   CCM required this visit?  No      Plan:     I have personally reviewed and noted the following in the patient's chart:   . Medical and social history . Use of alcohol, tobacco or illicit drugs  . Current medications and supplements . Functional ability and status . Nutritional status . Physical activity . Advanced directives . List of other physicians . Hospitalizations, surgeries, and ER visits in previous 12 months . Vitals . Screenings to include cognitive, depression, and falls . Referrals and appointments  In addition, I have reviewed and discussed with patient certain preventive protocols, quality metrics, and best practice recommendations. A written personalized care plan for preventive services as well as general preventive health recommendations were provided to patient.     Kellie Simmering, LPN   [id number]   Nurse Notes:

## 2020-06-22 ENCOUNTER — Other Ambulatory Visit: Payer: Self-pay | Admitting: Family Medicine

## 2020-06-22 DIAGNOSIS — I10 Essential (primary) hypertension: Secondary | ICD-10-CM

## 2020-06-22 DIAGNOSIS — J302 Other seasonal allergic rhinitis: Secondary | ICD-10-CM

## 2020-06-22 DIAGNOSIS — F5104 Psychophysiologic insomnia: Secondary | ICD-10-CM

## 2020-06-22 DIAGNOSIS — E782 Mixed hyperlipidemia: Secondary | ICD-10-CM

## 2020-06-22 NOTE — Telephone Encounter (Signed)
Requested Prescriptions  Pending Prescriptions Disp Refills   traZODone (DESYREL) 50 MG tablet [Pharmacy Med Name: TRAZODONE 50MG  TABLETS] 90 tablet 1    Sig: TAKE 1 TABLET(50 MG) BY MOUTH AT BEDTIME     Psychiatry: Antidepressants - Serotonin Modulator Passed - 06/22/2020 10:17 AM      Passed - Valid encounter within last 6 months    Recent Outpatient Visits          2 weeks ago Fatigue, unspecified type   [facility], Lupita Raider, FNP   5 months ago Fatigue, unspecified type   [facility], Lupita Raider, FNP   6 months ago Essential hypertension   [facility], Lupita Raider, FNP   8 months ago Acquired hypothyroidism   [facility] Olin Hauser, DO   1 year ago Hyperparathyroidism [facility])   [facility] Merrilyn Puma, Jerrel Ivory, NP              montelukast (SINGULAIR) 10 MG tablet [Pharmacy Med Name: MONTELUKAST 10MG  TABLETS] 90 tablet 1    Sig: TAKE 1 TABLET(10 MG) BY MOUTH DAILY     Pulmonology:  Leukotriene Inhibitors Passed - 06/22/2020 10:17 AM      Passed - Valid encounter within last 12 months    Recent Outpatient Visits          2 weeks ago Fatigue, unspecified type   [facility], Lupita Raider, FNP   5 months ago Fatigue, unspecified type   [facility], Lupita Raider, FNP   6 months ago Essential hypertension   [facility], Lupita Raider, FNP   8 months ago Acquired hypothyroidism   [facility] Olin Hauser, DO   1 year ago Hyperparathyroidism [facility])   [facility] Merrilyn Puma, Jerrel Ivory, NP              metoprolol succinate (TOPROL-XL) 50 MG 24 hr tablet [Pharmacy Med Name: METOPROLOL ER SUCCINATE 50MG  TABS] 90 tablet 0    Sig: TAKE 1 TABLET(50 MG) BY MOUTH EVERY MORNING     Cardiovascular:  Beta Blockers Passed - 06/22/2020 10:17 AM      Passed - Last BP in normal range     BP Readings from Last 1 Encounters:  06/08/20 138/62         Passed - Last Heart Rate in normal range    Pulse Readings from Last 1 Encounters:  06/08/20 (!) 57         Passed - Valid encounter within last 6 months    Recent Outpatient Visits          2 weeks ago Fatigue, unspecified type   [facility], Lupita Raider, FNP   5 months ago Fatigue, unspecified type   [facility] Lupita Raider, FNP   6 months ago Essential hypertension   [facility], Lupita Raider, FNP   8 months ago Acquired hypothyroidism   [place], DO   1 year ago Hyperparathyroidism [facility])   [facility], Jerrel Ivory, NP              simvastatin (ZOCOR) 40 MG tablet [Pharmacy Med Name: SIMVASTATIN 40MG  TABLETS] 90 tablet 1    Sig: TAKE 1 TABLET(40 MG) BY MOUTH AT BEDTIME     Cardiovascular:  Antilipid -  Statins Failed - 06/22/2020 10:17 AM      Failed - Total Cholesterol in normal range and within 360 days    Cholesterol, Total  Date Value Ref Range Status  12/21/2017 181 100 - 199 mg/dL Final   Cholesterol  Date Value Ref Range Status  11/01/2018 207 (H) <200 mg/dL Final         Failed - LDL in normal range and within 360 days    LDL Cholesterol (Calc)  Date Value Ref Range Status  11/01/2018 132 (H) mg/dL (calc) Final    Comment:    Reference range: <100 . Desirable range <100 mg/dL for primary prevention;   <70 mg/dL for patients with CHD or diabetic patients  with > or = 2 CHD risk factors. Marland Kitchen LDL-C is now calculated using the Martin-Hopkins  calculation, which is a validated novel method providing  better accuracy than the Friedewald equation in the  estimation of LDL-C.  Cresenciano Genre et al. Annamaria Helling. 4580;998(33): 2061-2068  (http://education.[facility].com/faq/FAQ164)          Failed - HDL in normal range and within 360 days    HDL  Date Value Ref Range  Status  11/01/2018 52 > OR = 50 mg/dL Final  12/21/2017 50 >39 mg/dL Final         Failed - Triglycerides in normal range and within 360 days    Triglycerides  Date Value Ref Range Status  11/01/2018 124 <150 mg/dL Final         Passed - Patient is not pregnant      Passed - Valid encounter within last 12 months    Recent Outpatient Visits          2 weeks ago Fatigue, unspecified type   [place], FNP   5 months ago Fatigue, unspecified type   [facility], Lupita Raider, FNP   6 months ago Essential hypertension   [facility], Lupita Raider, FNP   8 months ago Acquired hypothyroidism   [place], DO   1 year ago Hyperparathyroidism [facility])   [facility] Merrilyn Puma, Jerrel Ivory, NP

## 2020-07-03 ENCOUNTER — Other Ambulatory Visit: Payer: Self-pay | Admitting: Family Medicine

## 2020-07-03 DIAGNOSIS — E782 Mixed hyperlipidemia: Secondary | ICD-10-CM

## 2020-07-03 DIAGNOSIS — Z7189 Other specified counseling: Secondary | ICD-10-CM | POA: Diagnosis not present

## 2020-07-03 DIAGNOSIS — Z23 Encounter for immunization: Secondary | ICD-10-CM | POA: Diagnosis not present

## 2020-07-03 NOTE — Telephone Encounter (Signed)
Requested medication (s) are due for refill today: yes  Requested medication (s) are on the active medication list: yes  Last refill:  12/13/19  Future visit scheduled: no  Notes to clinic:  overdue lab work   Requested Prescriptions  Pending Prescriptions Disp Refills   gemfibrozil (LOPID) 600 MG tablet [Pharmacy Med Name: GEMFIBROZIL $RemoveBeforeDEI'600MG'[id number]$  TABLETS] 180 tablet 1    Sig: TAKE 1 TABLET BY MOUTH TWICE DAILY BEFORE A MEAL      Cardiovascular:  Antilipid - Fibric Acid Derivatives Failed - 07/03/2020 12:18 PM      Failed - Total Cholesterol in normal range and within 360 days    Cholesterol, Total  Date Value Ref Range Status  12/21/2017 181 100 - 199 mg/dL Final   Cholesterol  Date Value Ref Range Status  11/01/2018 207 (H) <200 mg/dL Final          Failed - LDL in normal range and within 360 days    LDL Cholesterol (Calc)  Date Value Ref Range Status  11/01/2018 132 (H) mg/dL (calc) Final    Comment:    Reference range: <100 . Desirable range <100 mg/dL for primary prevention;   <70 mg/dL for patients with CHD or diabetic patients  with > or = 2 CHD risk factors. Marland Kitchen LDL-C is now calculated using the Martin-Hopkins  calculation, which is a validated novel method providing  better accuracy than the Friedewald equation in the  estimation of LDL-C.  Cresenciano Genre et al. Annamaria Helling. 7425;956(38): 2061-2068  (http://education.[facility].com/faq/FAQ164)           Failed - HDL in normal range and within 360 days    HDL  Date Value Ref Range Status  11/01/2018 52 > OR = 50 mg/dL Final  12/21/2017 50 >39 mg/dL Final          Failed - Triglycerides in normal range and within 360 days    Triglycerides  Date Value Ref Range Status  11/01/2018 124 <150 mg/dL Final          Failed - ALT in normal range and within 180 days    ALT  Date Value Ref Range Status  01/02/2020 8 6 - 29 U/L Final   SGPT (ALT)  Date Value Ref Range Status  08/26/2012 30 12 - 78 U/L Final           Failed - AST in normal range and within 180 days    AST  Date Value Ref Range Status  01/02/2020 14 10 - 35 U/L Final   SGOT(AST)  Date Value Ref Range Status  08/26/2012 21 15 - 37 Unit/L Final          Passed - Cr in normal range and within 180 days    Creat  Date Value Ref Range Status  01/02/2020 0.82 0.60 - 0.93 mg/dL Final    Comment:    For patients >19 years of age, the reference limit for Creatinine is approximately 13% higher for people identified as African-American. .    Creatinine, Ser  Date Value Ref Range Status  04/28/2020 0.60 0.44 - 1.00 mg/dL Final          Passed - eGFR in normal range and within 180 days    GFR, Est African American  Date Value Ref Range Status  01/02/2020 80 > OR = 60 mL/min/1.35m2 Final   GFR calc Af Amer  Date Value Ref Range Status  04/28/2020 >60 >60 mL/min Final   GFR, Est Non African  American  Date Value Ref Range Status  01/02/2020 69 > OR = 60 mL/min/1.49m2 Final   GFR calc non Af Amer  Date Value Ref Range Status  04/28/2020 >60 >60 mL/min Final          Passed - Valid encounter within last 12 months    Recent Outpatient Visits           4 weeks ago Fatigue, unspecified type   [place], FNP   6 months ago Fatigue, unspecified type   [facility], Lupita Raider, FNP   7 months ago Essential hypertension   [facility], Lupita Raider, FNP   8 months ago Acquired hypothyroidism   [place], DO   1 year ago Hyperparathyroidism [facility])   [facility] Merrilyn Puma, Jerrel Ivory, NP

## 2020-07-06 ENCOUNTER — Other Ambulatory Visit: Payer: Self-pay | Admitting: Family Medicine

## 2020-07-06 DIAGNOSIS — E782 Mixed hyperlipidemia: Secondary | ICD-10-CM

## 2020-07-06 NOTE — Telephone Encounter (Signed)
PT need a refill  gemfibrozil (LOPID) 600 MG tablet [[id number]]  [id number] - [address], [place] AT [facility] OF [address]  [place] [address]  Phone: [phone] Fax: [phone]

## 2020-07-06 NOTE — Addendum Note (Signed)
Addended by: Denman George on: 07/06/2020 09:39 AM   Modules accepted: Orders

## 2020-07-06 NOTE — Telephone Encounter (Signed)
Requested medication (s) are due for refill today: yes  Requested medication (s) are on the active medication list: yes  Last refill:  12/13/19  #180  1 refill  Future visit scheduled: yes scheduled for labs and med check 07/09/20  Notes to clinic:  Patient states she just has one left. Sending back because I received extremely high warning for this medication and simvastatin both on med list.    Requested Prescriptions  Pending Prescriptions Disp Refills   gemfibrozil (LOPID) 600 MG tablet 60 tablet 0    Sig: TAKE 1 TABLET BY MOUTH 2 TIMES DAILY BEFORE A MEAL      Cardiovascular:  Antilipid - Fibric Acid Derivatives Failed - 07/06/2020  9:39 AM      Failed - Total Cholesterol in normal range and within 360 days    Cholesterol, Total  Date Value Ref Range Status  12/21/2017 181 100 - 199 mg/dL Final   Cholesterol  Date Value Ref Range Status  11/01/2018 207 (H) <200 mg/dL Final          Failed - LDL in normal range and within 360 days    LDL Cholesterol (Calc)  Date Value Ref Range Status  11/01/2018 132 (H) mg/dL (calc) Final    Comment:    Reference range: <100 . Desirable range <100 mg/dL for primary prevention;   <70 mg/dL for patients with CHD or diabetic patients  with > or = 2 CHD risk factors. Marland Kitchen LDL-C is now calculated using the Martin-Hopkins  calculation, which is a validated novel method providing  better accuracy than the Friedewald equation in the  estimation of LDL-C.  Cresenciano Genre et al. Annamaria Helling. 1694;503(88): 2061-2068  (http://education.[facility].com/faq/FAQ164)           Failed - HDL in normal range and within 360 days    HDL  Date Value Ref Range Status  11/01/2018 52 > OR = 50 mg/dL Final  12/21/2017 50 >39 mg/dL Final          Failed - Triglycerides in normal range and within 360 days    Triglycerides  Date Value Ref Range Status  11/01/2018 124 <150 mg/dL Final          Failed - ALT in normal range and within 180 days    ALT   Date Value Ref Range Status  01/02/2020 8 6 - 29 U/L Final   SGPT (ALT)  Date Value Ref Range Status  08/26/2012 30 12 - 78 U/L Final          Failed - AST in normal range and within 180 days    AST  Date Value Ref Range Status  01/02/2020 14 10 - 35 U/L Final   SGOT(AST)  Date Value Ref Range Status  08/26/2012 21 15 - 37 Unit/L Final          Passed - Cr in normal range and within 180 days    Creat  Date Value Ref Range Status  01/02/2020 0.82 0.60 - 0.93 mg/dL Final    Comment:    For patients >3 years of age, the reference limit for Creatinine is approximately 13% higher for people identified as African-American. .    Creatinine, Ser  Date Value Ref Range Status  04/28/2020 0.60 0.44 - 1.00 mg/dL Final          Passed - eGFR in normal range and within 180 days    GFR, Est African American  Date Value Ref Range Status  01/02/2020 80 >  OR = 60 mL/min/1.39m Final   GFR calc Af Amer  Date Value Ref Range Status  04/28/2020 >60 >60 mL/min Final   GFR, Est Non African American  Date Value Ref Range Status  01/02/2020 69 > OR = 60 mL/min/1.729mFinal   GFR calc non Af Amer  Date Value Ref Range Status  04/28/2020 >60 >60 mL/min Final          Passed - Valid encounter within last 12 months    Recent Outpatient Visits           1 month ago [facility], unspecified [facility]   6 months [facility], [facility]   7 months ago [facility]ential hypertension   [facility]   8 months ago [facility] hypothy[facility]   [facility]   [facility]  [facility]       Future Appointments             [facility]

## 2020-07-09 ENCOUNTER — Ambulatory Visit (INDEPENDENT_AMBULATORY_CARE_PROVIDER_SITE_OTHER): Payer: PPO | Admitting: Family Medicine

## 2020-07-09 ENCOUNTER — Encounter: Payer: Self-pay | Admitting: Family Medicine

## 2020-07-09 ENCOUNTER — Other Ambulatory Visit: Payer: Self-pay

## 2020-07-09 VITALS — BP 152/64 | HR 70 | Temp 98.1°F | Ht 64.0 in | Wt 163.8 lb

## 2020-07-09 DIAGNOSIS — E785 Hyperlipidemia, unspecified: Secondary | ICD-10-CM | POA: Diagnosis not present

## 2020-07-09 DIAGNOSIS — E782 Mixed hyperlipidemia: Secondary | ICD-10-CM

## 2020-07-09 DIAGNOSIS — I10 Essential (primary) hypertension: Secondary | ICD-10-CM | POA: Diagnosis not present

## 2020-07-09 MED ORDER — SIMVASTATIN 40 MG PO TABS: ORAL_TABLET | ORAL | 1 refills | Status: DC

## 2020-07-09 MED ORDER — GEMFIBROZIL 600 MG PO TABS: ORAL_TABLET | ORAL | 1 refills | Status: DC

## 2020-07-09 NOTE — Assessment & Plan Note (Signed)
Status unknown.  Last labs drawn 10/2018 with elevated cholesterol and elevated LDL levels.  Plan to recheck labs today.  Continue meds without changes today.  Refills provided. Followup after labs.

## 2020-07-09 NOTE — Progress Notes (Signed)
Subjective:    Patient ID: [name], female    DOB: 03-24-1941, 79 y.o.   MRN: [id number]  [name] is a 79 y.o. female presenting on 07/09/2020 for Hypertension   HPI   Ms. Ergle presents to clinic for follow up on her hypertension and hyperlipidemia, along with medication refills.  Denies any acute concerns today.  Hypertension - She is not checking BP at home or outside of clinic.    - Current medications: losartan 50mg  daily, tolerating well without side effects - She is not currently symptomatic. - Pt denies headache, lightheadedness, dizziness, changes in vision, chest tightness/pressure, palpitations, leg swelling, sudden loss of speech or loss of consciousness. - She  reports no regular exercise routine. - Her diet is high in salt, high in fat, and high in carbohydrates.   Depression screen [facility] 2/9 06/19/2020 10/12/2019 06/06/2019  Decreased Interest 0 1 0  Down, Depressed, Hopeless 0 1 0  PHQ - 2 Score 0 2 0  Altered sleeping - 0 -  Tired, decreased energy - 3 -  Change in appetite - 0 -  Feeling bad or failure about yourself  - 0 -  Trouble concentrating - 0 -  Moving slowly or fidgety/restless - 0 -  Suicidal thoughts - 0 -  PHQ-9 Score - 5 -  Difficult doing work/chores - Not difficult at all -    Social History   Tobacco Use  . Smoking status: Current Every Day Smoker    Packs/day: 0.50    Years: 40.00    Pack years: 20.00    Types: Cigarettes  . Smokeless tobacco: Never Used  Vaping Use  . Vaping Use: Never used  Substance Use Topics  . Alcohol use: No  . Drug use: No    Review of Systems  Constitutional: Negative.   HENT: Negative.   Eyes: Negative.   Respiratory: Negative.   Cardiovascular: Negative.   Gastrointestinal: Negative.   Endocrine: Negative.   Genitourinary: Negative.   Musculoskeletal: Negative.   Skin: Negative.   Allergic/Immunologic: Negative.   Neurological: Negative.   Hematological: Negative.     Psychiatric/Behavioral: Negative.    Per HPI unless specifically indicated above     Objective:    BP (!) 152/64 (BP Location: Left Arm, Patient Position: Sitting, Cuff Size: Normal)   Pulse 70   Temp 98.1 F (36.7 C) (Oral)   Ht 5\' 4"  (1.626 m)   Wt 163 lb 12.8 oz (74.3 kg)   LMP  (LMP Unknown)   BMI 28.12 kg/m   Wt Readings from Last 3 Encounters:  07/09/20 163 lb 12.8 oz (74.3 kg)  06/19/20 163 lb (73.9 kg)  06/08/20 164 lb 12.8 oz (74.8 kg)    Physical Exam Vitals and nursing note reviewed.  Constitutional:      General: She is not in acute distress.    Appearance: Normal appearance. She is well-developed, well-groomed and overweight. She is not ill-appearing or toxic-appearing.  HENT:     Head: Normocephalic and atraumatic.     Nose:     Comments: Lizbeth Bark is in place, covering mouth and nose. Eyes:     General: Lids are normal. Vision grossly intact.        Right eye: No discharge.        Left eye: No discharge.     Extraocular Movements: Extraocular movements intact.     Conjunctiva/sclera: Conjunctivae normal.     Pupils: Pupils are equal, round, and reactive to  light.  Cardiovascular:     Rate and Rhythm: Normal rate and regular rhythm.     Pulses: Normal pulses.     Heart sounds: Normal heart sounds. No murmur heard.  No friction rub. No gallop.   Pulmonary:     Effort: Pulmonary effort is normal. No respiratory distress.     Breath sounds: Normal breath sounds.  Skin:    General: Skin is warm and dry.     Capillary Refill: Capillary refill takes less than 2 seconds.  Neurological:     General: No focal deficit present.     Mental Status: She is alert and oriented to person, place, and time.  Psychiatric:        Attention and Perception: Attention and perception normal.        Mood and Affect: Mood and affect normal.        Speech: Speech normal.        Behavior: Behavior normal. Behavior is cooperative.        Thought Content: Thought content  normal.        Cognition and Memory: Cognition and memory normal.        Judgment: Judgment normal.    Results for orders placed or performed during the hospital encounter of 06/06/20  SARS CORONAVIRUS 2 (TAT 6-24 HRS) Nasopharyngeal Nasopharyngeal Swab   Specimen: Nasopharyngeal Swab  Result Value Ref Range   SARS Coronavirus 2 NEGATIVE NEGATIVE      Assessment & Plan:   Problem List Items Addressed This Visit      Cardiovascular and Mediastinum   Hypertension - Primary    Stable and controlled hypertension.  Pt is not working on lifestyle modifications.  Taking medications tolerating well without side effects.  Complications:  Overweight, HLD, hypothyroidism, hyperparathyroidism  Plan: 1. Continue taking losartan 50mg  daily 2. Obtain labs today  3. Encouraged heart healthy diet and increasing exercise to 30 minutes most days of the week, going no more than 2 days in a row without exercise. 4. Check BP 1-2 x per week at home, keep log, and bring to clinic at next appointment. 5. Follow up 6 months.       Relevant Medications   gemfibrozil (LOPID) 600 MG tablet   simvastatin (ZOCOR) 40 MG tablet   Other Relevant Orders   CBC with Differential   COMPLETE METABOLIC PANEL WITH GFR     Other   Hyperlipidemia    Status unknown.  Last labs drawn 10/2018 with elevated cholesterol and elevated LDL levels.  Plan to recheck labs today.  Continue meds without changes today.  Refills provided. Followup after labs.       Relevant Medications   gemfibrozil (LOPID) 600 MG tablet   simvastatin (ZOCOR) 40 MG tablet   Other Relevant Orders   Lipid Profile      Meds ordered this encounter  Medications  . gemfibrozil (LOPID) 600 MG tablet    Sig: TAKE 1 TABLET BY MOUTH 2 TIMES DAILY BEFORE A MEAL    Dispense:  180 tablet    Refill:  1  . simvastatin (ZOCOR) 40 MG tablet    Sig: TAKE 1 TABLET(40 MG) BY MOUTH AT BEDTIME    Dispense:  90 tablet    Refill:  1    Follow up  plan: Return in about 6 months (around 01/07/2021) for HTN F/U.   Harlin Rain, [place] [facility] Nurse Practitioner [facility] 07/09/2020, 12:08 PM

## 2020-07-09 NOTE — Assessment & Plan Note (Signed)
Stable and controlled hypertension.  Pt is not working on lifestyle modifications.  Taking medications tolerating well without side effects.  Complications:  Overweight, HLD, hypothyroidism, hyperparathyroidism  Plan: 1. Continue taking losartan 50mg  daily 2. Obtain labs today  3. Encouraged heart healthy diet and increasing exercise to 30 minutes most days of the week, going no more than 2 days in a row without exercise. 4. Check BP 1-2 x per week at home, keep log, and bring to clinic at next appointment. 5. Follow up 6 months.

## 2020-07-09 NOTE — Patient Instructions (Signed)
Have your labs drawn and we will contact you with the results.  Continue all of your medications as directed  We will plan to see you back in 6 months for hypertension follow up visit  You will receive a survey after today's visit either digitally by e-mail or paper by [place] mail. Your experiences and feedback matter to [place].  Please respond so we know how we are doing as we provide care for you.  Call us with any questions/concerns/needs.  It is my goal to be available to you for your health concerns.  Thanks for choosing me to be a partner in your healthcare needs!  Harlin Rain, FNP-C Family Nurse Practitioner [facility] Phone: [phone]

## 2020-07-26 DIAGNOSIS — E892 Postprocedural hypoparathyroidism: Secondary | ICD-10-CM | POA: Diagnosis not present

## 2020-08-30 ENCOUNTER — Other Ambulatory Visit: Payer: Self-pay | Admitting: Family Medicine

## 2020-08-30 DIAGNOSIS — I10 Essential (primary) hypertension: Secondary | ICD-10-CM

## 2020-08-30 DIAGNOSIS — E039 Hypothyroidism, unspecified: Secondary | ICD-10-CM

## 2020-09-11 ENCOUNTER — Other Ambulatory Visit: Payer: Self-pay

## 2020-09-11 ENCOUNTER — Ambulatory Visit (INDEPENDENT_AMBULATORY_CARE_PROVIDER_SITE_OTHER): Payer: PPO | Admitting: Family Medicine

## 2020-09-11 ENCOUNTER — Encounter: Payer: Self-pay | Admitting: Family Medicine

## 2020-09-11 VITALS — BP 148/58 | HR 58 | Temp 97.7°F | Resp 17 | Ht 64.0 in | Wt 165.2 lb

## 2020-09-11 DIAGNOSIS — F5104 Psychophysiologic insomnia: Secondary | ICD-10-CM | POA: Diagnosis not present

## 2020-09-11 DIAGNOSIS — R5383 Other fatigue: Secondary | ICD-10-CM

## 2020-09-11 DIAGNOSIS — E039 Hypothyroidism, unspecified: Secondary | ICD-10-CM

## 2020-09-11 MED ORDER — TRAZODONE HCL 50 MG PO TABS: ORAL_TABLET | ORAL | 1 refills | Status: DC

## 2020-09-11 NOTE — Patient Instructions (Signed)
Have your labs drawn and we will contact you with the results  We will plan to see you back if your symptoms worsen or fail to improve  You will receive a survey after today's visit either digitally by e-mail or paper by [employer] mail. Your experiences and feedback matter to us.  Please respond so we know how we are doing as we provide care for you.  Call us with any questions/concerns/needs.  It is my goal to be available to you for your health concerns.  Thanks for choosing me to be a partner in your healthcare needs!  Alannah Averhart Marie Tema Alire, FNP-C Family Nurse Practitioner [facility] Phone: [phone]  

## 2020-09-11 NOTE — Progress Notes (Signed)
Subjective:    Patient ID: [name], female    DOB: [birth year], 79 y.o.   MRN: [id number]  [name] is a 79 y.o. female presenting on 09/11/2020 for Chills (Pt complains of a chronic feeling of coldness and full feeling in her headaches x several months. She is requesting that we do labwork.)   HPI  Ms. Fauver presents to clinic with concerns of chronic feeling of coldness with headaches over the past few months.  Denies anything that has made her symptoms better or worse.  She has recently had a parathyroidectomy on 04/27/2020 and has not yet followed up with endocrinology for re-evaluation.  Denies fevers, unintentional weight loss, lymphadenopathy, recent illness, sick contacts, travel, rashes, or other concerns.  Depression screen [facility] 2/9 06/19/2020 10/12/2019 06/06/2019  Decreased Interest 0 1 0  Down, Depressed, Hopeless 0 1 0  PHQ - 2 Score 0 2 0  Altered sleeping - 0 -  Tired, decreased energy - 3 -  Change in appetite - 0 -  Feeling bad or failure about yourself  - 0 -  Trouble concentrating - 0 -  Moving slowly or fidgety/restless - 0 -  Suicidal thoughts - 0 -  PHQ-9 Score - 5 -  Difficult doing work/chores - Not difficult at all -    Social History   Tobacco Use  . Smoking status: Current Every Day Smoker    Packs/day: 0.50    Years: 40.00    Pack years: 20.00    Types: Cigarettes  . Smokeless tobacco: Never Used  Vaping Use  . Vaping Use: Never used  Substance Use Topics  . Alcohol use: No  . Drug use: No    Review of Systems  Constitutional: Negative.        Feeling cold  HENT: Negative.   Eyes: Negative.   Respiratory: Negative.   Cardiovascular: Negative.   Gastrointestinal: Negative.   Endocrine: Negative.   Genitourinary: Negative.   Musculoskeletal: Negative.   Skin: Negative.   Allergic/Immunologic: Negative.   Neurological: Positive for headaches. Negative for dizziness, tremors, seizures, syncope, facial asymmetry, speech  difficulty, weakness, light-headedness and numbness.  Hematological: Negative.   Psychiatric/Behavioral: Negative.    Per HPI unless specifically indicated above     Objective:    BP (!) 148/58 (BP Location: Left Arm, Patient Position: Sitting, Cuff Size: Normal)   Pulse (!) 58   Temp 97.7 F (36.5 C) (Temporal)   Resp 17   Ht 5\' 4"  (1.626 m)   Wt 165 lb 3.2 oz (74.9 kg)   LMP  (LMP Unknown)   SpO2 100%   BMI 28.36 kg/m   Wt Readings from Last 3 Encounters:  09/11/20 165 lb 3.2 oz (74.9 kg)  07/09/20 163 lb 12.8 oz (74.3 kg)  06/19/20 163 lb (73.9 kg)    Physical Exam Vitals and nursing note reviewed.  Constitutional:      General: She is not in acute distress.    Appearance: Normal appearance. She is well-developed, well-groomed and overweight. She is not ill-appearing or toxic-appearing.  HENT:     Head: Normocephalic and atraumatic.     Nose:     Comments: Lizbeth Bark is in place, covering mouth and nose. Eyes:     General: Lids are normal. Vision grossly intact.        Right eye: No discharge.        Left eye: No discharge.     Extraocular Movements: Extraocular movements intact.  Conjunctiva/sclera: Conjunctivae normal.     Pupils: Pupils are equal, round, and reactive to light.  Cardiovascular:     Rate and Rhythm: Normal rate and regular rhythm.     Pulses: Normal pulses.     Heart sounds: Normal heart sounds. No murmur heard. No friction rub. No gallop.   Pulmonary:     Effort: Pulmonary effort is normal. No respiratory distress.     Breath sounds: Normal breath sounds.  Skin:    General: Skin is warm and dry.     Capillary Refill: Capillary refill takes less than 2 seconds.  Neurological:     General: No focal deficit present.     Mental Status: She is alert and oriented to person, place, and time.     Cranial Nerves: Cranial nerves are intact.     Sensory: Sensation is intact.     Motor: Motor function is intact.  Psychiatric:        Attention and  Perception: Attention and perception normal.        Mood and Affect: Mood and affect normal.        Speech: Speech normal.        Behavior: Behavior normal. Behavior is cooperative.        Thought Content: Thought content normal.        Cognition and Memory: Cognition and memory normal.        Judgment: Judgment normal.    Results for orders placed or performed in visit on 09/11/20  CBC with Differential  Result Value Ref Range   WBC 7.4 3.8 - 10.8 Thousand/uL   RBC 4.87 3.80 - 5.10 Million/uL   Hemoglobin 15.9 (H) 11.7 - 15.5 g/dL   HCT 45.2 (H) 35.0 - 45.0 %   MCV 92.8 80.0 - 100.0 fL   MCH 32.6 27.0 - 33.0 pg   MCHC 35.2 32.0 - 36.0 g/dL   RDW 12.2 11.0 - 15.0 %   Platelets 144 140 - 400 Thousand/uL   MPV 14.0 (H) 7.5 - 12.5 fL   Neutro Abs 3,863 1,500 - 7,800 cells/uL   Lymphs Abs 2,686 850 - 3,900 cells/uL   Absolute Monocytes 673 200 - 950 cells/uL   Eosinophils Absolute 118 15 - 500 cells/uL   Basophils Absolute 59 0 - 200 cells/uL   Neutrophils Relative % 52.2 %   Total Lymphocyte 36.3 %   Monocytes Relative 9.1 %   Eosinophils Relative 1.6 %   Basophils Relative 0.8 %  COMPLETE METABOLIC PANEL WITH GFR  Result Value Ref Range   Glucose, Bld 89 65 - 99 mg/dL   BUN 12 7 - 25 mg/dL   Creat 0.85 0.60 - 0.93 mg/dL   GFR, Est Non African American 66 > OR = 60 mL/min/1.33m2   GFR, Est African American 76 > OR = 60 mL/min/1.15m2   BUN/Creatinine Ratio NOT APPLICABLE 6 - 22 (calc)   Sodium 142 135 - 146 mmol/L   Potassium 4.5 3.5 - 5.3 mmol/L   Chloride 107 98 - 110 mmol/L   CO2 25 20 - 32 mmol/L   Calcium 10.4 8.6 - 10.4 mg/dL   Total Protein 7.3 6.1 - 8.1 g/dL   Albumin 4.8 3.6 - 5.1 g/dL   Globulin 2.5 1.9 - 3.7 g/dL (calc)   AG Ratio 1.9 1.0 - 2.5 (calc)   Total Bilirubin 0.8 0.2 - 1.2 mg/dL   Alkaline phosphatase (APISO) 71 37 - 153 U/L   AST 15 10 - 35 U/L  ALT 8 6 - 29 U/L  TSH + free T4  Result Value Ref Range   TSH W/REFLEX TO FT4 3.24 0.40 - 4.50  mIU/L  Fe+TIBC+Fer  Result Value Ref Range   Iron 136 45 - 160 mcg/dL   TIBC 382 250 - 450 mcg/dL (calc)   %SAT 36 16 - 45 % (calc)   Ferritin 127 16 - 288 ng/mL  VITAMIN D 25 Hydroxy (Vit-D Deficiency, Fractures)  Result Value Ref Range   Vit D, 25-Hydroxy 42 30 - 100 ng/mL  B12 and Folate Panel  Result Value Ref Range   Vitamin B-12 421 200 - 1,100 pg/mL   Folate 23.3 ng/mL      Assessment & Plan:   Problem List Items Addressed This Visit      Endocrine   Acquired hypothyroidism - Primary    Will have repeat TSH done due to new concerns of feeling cold and intermittent headaches.  Labs to be done today.      Relevant Orders   TSH + free T4 (Completed)     Other   Psychophysiological insomnia    Requesting refill on trazodone, reports insomnia is stable and well controlled.  Refills sent to pharmacy on file.      Relevant Medications   traZODone (DESYREL) 50 MG tablet   Fatigue    Will have labs drawn today for evaluation.  If labs return WNL, will have her follow up with endocrinology since she has not been seen by them since her parathyroidectomy 04/2020.        Relevant Orders   CBC with Differential (Completed)   COMPLETE METABOLIC PANEL WITH GFR (Completed)   Heavy Metals Panel, Blood   Fe+TIBC+Fer (Completed)   VITAMIN D 25 Hydroxy (Vit-D Deficiency, Fractures) (Completed)   B12 and Folate Panel (Completed)      Meds ordered this encounter  Medications  . traZODone (DESYREL) 50 MG tablet    Sig: TAKE 1 TABLET(50 MG) BY MOUTH AT BEDTIME    Dispense:  90 tablet    Refill:  1   Follow up plan: Return if symptoms worsen or fail to improve.   Harlin Rain, [place] Family Nurse Practitioner [facility] 09/11/2020, 11:37 AM

## 2020-09-12 NOTE — Assessment & Plan Note (Addendum)
Requesting refill on trazodone, reports insomnia is stable and well controlled.  Refills sent to pharmacy on file.

## 2020-09-12 NOTE — Assessment & Plan Note (Signed)
Will have labs drawn today for evaluation.  If labs return WNL, will have her follow up with endocrinology since she has not been seen by them since her parathyroidectomy 04/2020.

## 2020-09-12 NOTE — Assessment & Plan Note (Signed)
Will have repeat TSH done due to new concerns of feeling cold and intermittent headaches.  Labs to be done today.

## 2020-09-13 LAB — CBC WITH DIFFERENTIAL/PLATELET
Absolute Monocytes: 673 cells/uL (ref 200–950)
Basophils Absolute: 59 cells/uL (ref 0–200)
Basophils Relative: 0.8 %
Eosinophils Absolute: 118 cells/uL (ref 15–500)
Eosinophils Relative: 1.6 %
HCT: 45.2 % — ABNORMAL HIGH (ref 35.0–45.0)
Hemoglobin: 15.9 g/dL — ABNORMAL HIGH (ref 11.7–15.5)
Lymphs Abs: 2686 cells/uL (ref 850–3900)
MCH: 32.6 pg (ref 27.0–33.0)
MCHC: 35.2 g/dL (ref 32.0–36.0)
MCV: 92.8 fL (ref 80.0–100.0)
MPV: 14 fL — ABNORMAL HIGH (ref 7.5–12.5)
Monocytes Relative: 9.1 %
Neutro Abs: 3863 cells/uL (ref 1500–7800)
Neutrophils Relative %: 52.2 %
Platelets: 144 10*3/uL (ref 140–400)
RBC: 4.87 10*6/uL (ref 3.80–5.10)
RDW: 12.2 % (ref 11.0–15.0)
Total Lymphocyte: 36.3 %
WBC: 7.4 10*3/uL (ref 3.8–10.8)

## 2020-09-13 LAB — HEAVY METALS PANEL, BLOOD
Arsenic: <10 mcg/L (ref <23)
Lead: <1 ug/dL (ref <5)
Mercury, B: <5 mcg/L (ref 0–10)

## 2020-09-13 LAB — IRON,TIBC AND FERRITIN PANEL
%SAT: 36 % (calc) (ref 16–45)
Ferritin: 127 ng/mL (ref 16–288)
Iron: 136 ug/dL (ref 45–160)
TIBC: 382 mcg/dL (calc) (ref 250–450)

## 2020-09-13 LAB — COMPLETE METABOLIC PANEL WITH GFR
AG Ratio: 1.9 (calc) (ref 1.0–2.5)
ALT: 8 U/L (ref 6–29)
AST: 15 U/L (ref 10–35)
Albumin: 4.8 g/dL (ref 3.6–5.1)
Alkaline phosphatase (APISO): 71 U/L (ref 37–153)
BUN: 12 mg/dL (ref 7–25)
CO2: 25 mmol/L (ref 20–32)
Calcium: 10.4 mg/dL (ref 8.6–10.4)
Chloride: 107 mmol/L (ref 98–110)
Creat: 0.85 mg/dL (ref 0.60–0.93)
GFR, Est African American: 76 mL/min/{1.73_m2} (ref >=60)
GFR, Est Non African American: 66 mL/min/{1.73_m2} (ref >=60)
Globulin: 2.5 g/dL (calc) (ref 1.9–3.7)
Glucose, Bld: 89 mg/dL (ref 65–99)
Potassium: 4.5 mmol/L (ref 3.5–5.3)
Sodium: 142 mmol/L (ref 135–146)
Total Bilirubin: 0.8 mg/dL (ref 0.2–1.2)
Total Protein: 7.3 g/dL (ref 6.1–8.1)

## 2020-09-13 LAB — B12 AND FOLATE PANEL
Folate: 23.3 ng/mL
Vitamin B-12: 421 pg/mL (ref 200–1100)

## 2020-09-13 LAB — VITAMIN D 25 HYDROXY (VIT D DEFICIENCY, FRACTURES): Vit D, 25-Hydroxy: 42 ng/mL (ref 30–100)

## 2020-09-13 LAB — TSH+FREE T4: TSH W/REFLEX TO FT4: 3.24 mIU/L (ref 0.40–4.50)

## 2020-09-20 ENCOUNTER — Other Ambulatory Visit: Payer: Self-pay | Admitting: Family Medicine

## 2020-09-20 DIAGNOSIS — I10 Essential (primary) hypertension: Secondary | ICD-10-CM

## 2020-10-03 ENCOUNTER — Other Ambulatory Visit: Payer: Self-pay | Admitting: Family Medicine

## 2020-10-03 DIAGNOSIS — G2581 Restless legs syndrome: Secondary | ICD-10-CM

## 2020-10-03 DIAGNOSIS — F324 Major depressive disorder, single episode, in partial remission: Secondary | ICD-10-CM

## 2020-10-03 DIAGNOSIS — F419 Anxiety disorder, unspecified: Secondary | ICD-10-CM

## 2020-10-16 ENCOUNTER — Other Ambulatory Visit: Payer: Self-pay | Admitting: Family Medicine

## 2020-10-16 DIAGNOSIS — Z1231 Encounter for screening mammogram for malignant neoplasm of breast: Secondary | ICD-10-CM

## 2020-10-31 DIAGNOSIS — L570 Actinic keratosis: Secondary | ICD-10-CM | POA: Diagnosis not present

## 2020-10-31 DIAGNOSIS — L821 Other seborrheic keratosis: Secondary | ICD-10-CM | POA: Diagnosis not present

## 2020-10-31 DIAGNOSIS — D225 Melanocytic nevi of trunk: Secondary | ICD-10-CM | POA: Diagnosis not present

## 2020-10-31 DIAGNOSIS — D2261 Melanocytic nevi of right upper limb, including shoulder: Secondary | ICD-10-CM | POA: Diagnosis not present

## 2020-10-31 DIAGNOSIS — X32XXXA Exposure to sunlight, initial encounter: Secondary | ICD-10-CM | POA: Diagnosis not present

## 2020-10-31 DIAGNOSIS — D2262 Melanocytic nevi of left upper limb, including shoulder: Secondary | ICD-10-CM | POA: Diagnosis not present

## 2020-10-31 DIAGNOSIS — Z85828 Personal history of other malignant neoplasm of skin: Secondary | ICD-10-CM | POA: Diagnosis not present

## 2020-11-09 ENCOUNTER — Other Ambulatory Visit: Payer: Self-pay

## 2020-11-09 ENCOUNTER — Ambulatory Visit
Admission: RE | Admit: 2020-11-09 | Discharge: 2020-11-09 | Disposition: A | Discharge status: Home / Self Care | Payer: PPO | Source: Ambulatory Visit | Attending: Family Medicine | Admitting: Family Medicine

## 2020-11-09 DIAGNOSIS — Z1231 Encounter for screening mammogram for malignant neoplasm of breast: Secondary | ICD-10-CM

## 2020-11-13 ENCOUNTER — Ambulatory Visit (INDEPENDENT_AMBULATORY_CARE_PROVIDER_SITE_OTHER): Payer: PPO | Admitting: Unknown Physician Specialty

## 2020-11-13 ENCOUNTER — Other Ambulatory Visit: Payer: Self-pay

## 2020-11-13 ENCOUNTER — Encounter: Payer: Self-pay | Admitting: Unknown Physician Specialty

## 2020-11-13 DIAGNOSIS — I10 Essential (primary) hypertension: Secondary | ICD-10-CM | POA: Diagnosis not present

## 2020-11-13 DIAGNOSIS — Z79899 Other long term (current) drug therapy: Secondary | ICD-10-CM

## 2020-11-13 DIAGNOSIS — E785 Hyperlipidemia, unspecified: Secondary | ICD-10-CM

## 2020-11-13 DIAGNOSIS — R6889 Other general symptoms and signs: Secondary | ICD-10-CM | POA: Insufficient documentation

## 2020-11-13 NOTE — Assessment & Plan Note (Signed)
Cause is unclear.  She is taking a number of medications and unclear on why for many of them. Will DC several and see how she tolerates

## 2020-11-13 NOTE — Progress Notes (Signed)
BP (!) 159/72 (BP Location: Right Arm, Patient Position: Sitting, Cuff Size: Normal)   Pulse 62   Temp 97.8 F (36.6 C) (Temporal)   Resp 17   Ht 5\' 4"  (1.626 m)   Wt 169 lb 12.8 oz (77 kg)   LMP  (LMP Unknown)   SpO2 100%   BMI 29.15 kg/m    Subjective:    Patient ID: [name], female    DOB: 06-01-1941, 80 y.o.   MRN: [id number]  HPI: [name] is a 80 y.o. female  Chief Complaint  Patient presents with  . Chills    Pt complains of chronic internal cold feeling all the time    Pt states this has been going on for several years.  See note from 12/21 and had a lot of labs, all were WNL.  She is on a lot of medications and not clear why.  States she has black stools on occasion.  CBC and Ferritin were normal.    Relevant past medical, surgical, family and social history reviewed and updated as indicated. Interim medical history since our last visit reviewed. Allergies and medications reviewed and updated.  Review of Systems  Per HPI unless specifically indicated above     Objective:    BP (!) 159/72 (BP Location: Right Arm, Patient Position: Sitting, Cuff Size: Normal)   Pulse 62   Temp 97.8 F (36.6 C) (Temporal)   Resp 17   Ht 5\' 4"  (1.626 m)   Wt 169 lb 12.8 oz (77 kg)   LMP  (LMP Unknown)   SpO2 100%   BMI 29.15 kg/m   Wt Readings from Last 3 Encounters:  11/13/20 169 lb 12.8 oz (77 kg)  09/11/20 165 lb 3.2 oz (74.9 kg)  07/09/20 163 lb 12.8 oz (74.3 kg)    Physical Exam Constitutional:      General: She is not in acute distress.    Appearance: Normal appearance. She is well-developed and well-nourished.  HENT:     Head: Normocephalic and atraumatic.  Eyes:     General: Lids are normal. No scleral icterus.       Right eye: No discharge.        Left eye: No discharge.     Conjunctiva/sclera: Conjunctivae normal.  Neck:     Vascular: No carotid bruit or JVD.  Cardiovascular:     Rate and Rhythm: Normal rate and regular rhythm.      Heart sounds: Normal heart sounds.  Pulmonary:     Effort: Pulmonary effort is normal.     Breath sounds: Normal breath sounds.  Abdominal:     Palpations: There is no hepatomegaly or splenomegaly.  Musculoskeletal:        General: Normal range of motion.     Cervical back: Normal range of motion and neck supple.  Skin:    General: Skin is warm, dry and intact.     Coloration: Skin is not pale.     Findings: No rash.  Neurological:     Mental Status: She is alert and oriented to person, place, and time.  Psychiatric:        Mood and Affect: Mood and affect normal.        Behavior: Behavior normal.        Thought Content: Thought content normal.        Judgment: Judgment normal.      Assessment & Plan:   Problem List Items Addressed This Visit  Unprioritized   Cold intolerance    Cause is unclear.  She is taking a number of medications and unclear on why for many of them. Will DC several and see how she tolerates      Hyperlipidemia    Stop Gemfibrizole as last triglycerides are 124.  Stop Simvistatin as ? intolerance      Hypertension    BP not to goal.  Consider going from Amlodipine 10 to 5 mg. Next visit and adding a thiaide.  Stop Furosemide which she is taking daily      Polypharmacy    Written list given of DC's medications.           Consider referral to GI due to black stools.  Pt would like to wait on that.    Follow up plan: Return in about 3 weeks (around 12/04/2020).

## 2020-11-13 NOTE — Assessment & Plan Note (Signed)
Stop Gemfibrizole as last triglycerides are 124.  Stop Simvistatin as ? intolerance

## 2020-11-13 NOTE — Assessment & Plan Note (Signed)
Written list given of DC's medications.

## 2020-11-13 NOTE — Patient Instructions (Signed)
Stop taking" Furosemide Simvistatin Gemfibrizole (last triglycerides) 2020 Monnelucast Aspirin

## 2020-11-13 NOTE — Assessment & Plan Note (Signed)
BP not to goal.  Consider going from Amlodipine 10 to 5 mg. Next visit and adding a thiaide.  Stop Furosemide which she is taking daily

## 2020-12-04 ENCOUNTER — Encounter: Payer: Self-pay | Admitting: Unknown Physician Specialty

## 2020-12-04 ENCOUNTER — Ambulatory Visit (INDEPENDENT_AMBULATORY_CARE_PROVIDER_SITE_OTHER): Payer: PPO | Admitting: Unknown Physician Specialty

## 2020-12-04 ENCOUNTER — Other Ambulatory Visit: Payer: Self-pay

## 2020-12-04 VITALS — BP 151/62 | HR 56 | Temp 97.1°F | Resp 17 | Ht 64.0 in | Wt 171.6 lb

## 2020-12-04 DIAGNOSIS — R197 Diarrhea, unspecified: Secondary | ICD-10-CM | POA: Diagnosis not present

## 2020-12-04 DIAGNOSIS — R6883 Chills (without fever): Secondary | ICD-10-CM | POA: Diagnosis not present

## 2020-12-04 DIAGNOSIS — I10 Essential (primary) hypertension: Secondary | ICD-10-CM | POA: Diagnosis not present

## 2020-12-04 DIAGNOSIS — F5104 Psychophysiologic insomnia: Secondary | ICD-10-CM | POA: Diagnosis not present

## 2020-12-04 MED ORDER — TRAZODONE HCL 50 MG PO TABS: ORAL_TABLET | ORAL | 1 refills | Status: DC

## 2020-12-04 MED ORDER — AMLODIPINE BESYLATE 10 MG PO TABS
ORAL_TABLET | ORAL | 1 refills | Status: DC
Start: 2020-12-04 — End: 2021-04-17

## 2020-12-04 NOTE — Assessment & Plan Note (Addendum)
?   Serotonin syndrome with Citalopram and Trazodone. Pt to decrease Citalopram to 10 mg daily. Refill Trazodone.

## 2020-12-04 NOTE — Progress Notes (Signed)
BP (!) 151/62 (BP Location: Left Arm, Patient Position: Sitting, Cuff Size: Normal)   Pulse (!) 56   Temp (!) 97.1 F (36.2 C) (Temporal)   Resp 17   Ht 5\' 4"  (1.626 m)   Wt 171 lb 9.6 oz (77.8 kg)   LMP  (LMP Unknown)   SpO2 98%   BMI 29.46 kg/m    Subjective:    Patient ID: [name], female    DOB: 06/05/1941, 80 y.o.   MRN: [id number]  HPI: [name] is a 80 y.o. female  Chief Complaint  Patient presents with  . cold tolerance    Pt would like to discuss her recent lab results. Pt requesting a B12 injection to help with her energy levels  . stool incontinence    Intermittent Black tarry watery stools at least 4 times a week. The last episode was yesterday. Pt contributed to the iron supplements, but she discontinued it x 3 weeks ago.   Cold Intolerance Pt was having complaints of constantly being cold. All lab work, including TSH, was within normal range in Dec 2021. On last visit, several medications were discontinued in an attempt to reduce polypharmacy and help with symptoms. No change in cold intolerance. Pt also reporting fatigue that has lasted several months. Requesting B12 shot.   Diarrhea Pt now experiencing black, watery diarrhea x 2 weeks. Pt discontinued iron 2 weeks ago thinking that was causing the issue. Pt also complains of new stool incontinence requiring the use of briefs and BMs up to 4 times daily. Pt denies N/V and ABD pain.   Hypertension BP slightly above goal of <150/90 in office today. Pt does not check BP at home. Pt requesting refill of amlodipine.   Insomnia Pt reports issues with falling asleep. Requesting refill of Trazodone.     Relevant past medical, surgical, family and social history reviewed and updated as indicated. Interim medical history since our last visit reviewed. Allergies and medications reviewed and updated.  Review of Systems  Constitutional: Positive for chills and fatigue. Negative for fever.  HENT:  Negative.   Respiratory: Negative for cough and shortness of breath.   Cardiovascular: Negative for chest pain, palpitations and leg swelling.  Gastrointestinal: Positive for diarrhea. Negative for abdominal pain, nausea and vomiting.  Endocrine: Positive for cold intolerance.  Genitourinary: Negative.   Skin: Negative.   Neurological: Negative for dizziness, weakness, light-headedness and headaches.  Psychiatric/Behavioral: Negative.     Per HPI unless specifically indicated above     Objective:    BP (!) 151/62 (BP Location: Left Arm, Patient Position: Sitting, Cuff Size: Normal)   Pulse (!) 56   Temp (!) 97.1 F (36.2 C) (Temporal)   Resp 17   Ht 5\' 4"  (1.626 m)   Wt 171 lb 9.6 oz (77.8 kg)   LMP  (LMP Unknown)   SpO2 98%   BMI 29.46 kg/m   Wt Readings from Last 3 Encounters:  12/04/20 171 lb 9.6 oz (77.8 kg)  11/13/20 169 lb 12.8 oz (77 kg)  09/11/20 165 lb 3.2 oz (74.9 kg)    Physical Exam Vitals reviewed.  HENT:     Head: Normocephalic and atraumatic.  Cardiovascular:     Rate and Rhythm: Regular rhythm. Bradycardia present.     Pulses: Normal pulses.     Heart sounds: Normal heart sounds.  Pulmonary:     Effort: Pulmonary effort is normal.     Breath sounds: Normal breath sounds.  Abdominal:  General: Abdomen is flat. Bowel sounds are normal.     Palpations: Abdomen is soft.     Tenderness: There is abdominal tenderness in the left upper quadrant.  Genitourinary:    Rectum: Guaiac result negative.  Musculoskeletal:     Right lower leg: No edema.     Left lower leg: No edema.  Skin:    General: Skin is warm and dry.     Capillary Refill: Capillary refill takes less than 2 seconds.  Neurological:     General: No focal deficit present.     Mental Status: She is alert and oriented to person, place, and time.  Psychiatric:        Mood and Affect: Mood is anxious.        Speech: Speech normal.        Behavior: Behavior normal.        Thought Content:  Thought content normal.     Results for orders placed or performed in visit on 09/11/20  CBC with Differential  Result Value Ref Range   WBC 7.4 3.8 - 10.8 Thousand/uL   RBC 4.87 3.80 - 5.10 Million/uL   Hemoglobin 15.9 (H) 11.7 - 15.5 g/dL   HCT 45.2 (H) 35.0 - 45.0 %   MCV 92.8 80.0 - 100.0 fL   MCH 32.6 27.0 - 33.0 pg   MCHC 35.2 32.0 - 36.0 g/dL   RDW 12.2 11.0 - 15.0 %   Platelets 144 140 - 400 Thousand/uL   MPV 14.0 (H) 7.5 - 12.5 fL   Neutro Abs 3,863 1,500 - 7,800 cells/uL   Lymphs Abs 2,686 850 - 3,900 cells/uL   Absolute Monocytes 673 200 - 950 cells/uL   Eosinophils Absolute 118 15 - 500 cells/uL   Basophils Absolute 59 0 - 200 cells/uL   Neutrophils Relative % 52.2 %   Total Lymphocyte 36.3 %   Monocytes Relative 9.1 %   Eosinophils Relative 1.6 %   Basophils Relative 0.8 %  COMPLETE METABOLIC PANEL WITH GFR  Result Value Ref Range   Glucose, Bld 89 65 - 99 mg/dL   BUN 12 7 - 25 mg/dL   Creat 0.85 0.60 - 0.93 mg/dL   GFR, Est Non African American 66 > OR = 60 mL/min/1.71m2   GFR, Est African American 76 > OR = 60 mL/min/1.70m2   BUN/Creatinine Ratio NOT APPLICABLE 6 - 22 (calc)   Sodium 142 135 - 146 mmol/L   Potassium 4.5 3.5 - 5.3 mmol/L   Chloride 107 98 - 110 mmol/L   CO2 25 20 - 32 mmol/L   Calcium 10.4 8.6 - 10.4 mg/dL   Total Protein 7.3 6.1 - 8.1 g/dL   Albumin 4.8 3.6 - 5.1 g/dL   Globulin 2.5 1.9 - 3.7 g/dL (calc)   AG Ratio 1.9 1.0 - 2.5 (calc)   Total Bilirubin 0.8 0.2 - 1.2 mg/dL   Alkaline phosphatase (APISO) 71 37 - 153 U/L   AST 15 10 - 35 U/L   ALT 8 6 - 29 U/L  TSH + free T4  Result Value Ref Range   TSH W/REFLEX TO FT4 3.24 0.40 - 4.50 mIU/L  Heavy Metals Panel, Blood  Result Value Ref Range   Arsenic <10 <23 mcg/L   Lead <1 <5 mcg/dL   Mercury, B <5 0 - 10 mcg/L  Fe+TIBC+Fer  Result Value Ref Range   Iron 136 45 - 160 mcg/dL   TIBC 382 250 - 450 mcg/dL (calc)   %SAT  36 16 - 45 % (calc)   Ferritin 127 16 - 288 ng/mL  VITAMIN  D 25 Hydroxy (Vit-D Deficiency, Fractures)  Result Value Ref Range   Vit D, 25-Hydroxy 42 30 - 100 ng/mL  B12 and Folate Panel  Result Value Ref Range   Vitamin B-12 421 200 - 1,100 pg/mL   Folate 23.3 ng/mL      Assessment & Plan:   Problem List Items Addressed This Visit      Other   Psychophysiological insomnia    ? Serotonin syndrome with Citalopram and Trazodone. Pt to decrease Citalopram to 10 mg daily. Refill Trazodone.       Relevant Medications   traZODone (DESYREL) 50 MG tablet    Other Visit Diagnoses    Chills (without fever)    -  Primary   Pt continues to have cold intolerance. Will repeat CBC and CMP. Will check thyroid panel with TSH and Methylmalonic acid.    Relevant Orders   Thyroid Panel With TSH   CBC   Comprehensive metabolic panel   Methylmalonic Acid   Ambulatory referral to Gastroenterology   Essential hypertension       Pt currently taking amlodipine, metoprolol, and losartan. BP not at goal today. Pt not checking BPs at home. Will leave as is and f/u in 2 weeks.   Relevant Medications   amLODipine (NORVASC) 10 MG tablet   Diarrhea, unspecified type       Checking CMP and CBC. Referral to GI for further workup.    Relevant Orders   Ambulatory referral to Gastroenterology       Follow up plan: Return in about 2 weeks (around 12/18/2020).

## 2020-12-04 NOTE — Patient Instructions (Signed)
Take 1/2 Citalopram Stop Alprazolin and Ferrous Sulfate.

## 2020-12-05 ENCOUNTER — Other Ambulatory Visit: Payer: PPO

## 2020-12-05 DIAGNOSIS — R6883 Chills (without fever): Secondary | ICD-10-CM

## 2020-12-10 LAB — CBC
HCT: 45.5 % — ABNORMAL HIGH (ref 35.0–45.0)
Hemoglobin: 15.5 g/dL (ref 11.7–15.5)
MCH: 32.8 pg (ref 27.0–33.0)
MCHC: 34.1 g/dL (ref 32.0–36.0)
MCV: 96.2 fL (ref 80.0–100.0)
MPV: 13.8 fL — ABNORMAL HIGH (ref 7.5–12.5)
Platelets: 123 10*3/uL — ABNORMAL LOW (ref 140–400)
RBC: 4.73 10*6/uL (ref 3.80–5.10)
RDW: 13.1 % (ref 11.0–15.0)
WBC: 6.9 10*3/uL (ref 3.8–10.8)

## 2020-12-10 LAB — METHYLMALONIC ACID, SERUM: Methylmalonic Acid, Quant: 178 nmol/L (ref 87–318)

## 2020-12-10 LAB — COMPREHENSIVE METABOLIC PANEL
AG Ratio: 2 (calc) (ref 1.0–2.5)
ALT: 8 U/L (ref 6–29)
AST: 14 U/L (ref 10–35)
Albumin: 4.4 g/dL (ref 3.6–5.1)
Alkaline phosphatase (APISO): 58 U/L (ref 37–153)
BUN: 12 mg/dL (ref 7–25)
CO2: 28 mmol/L (ref 20–32)
Calcium: 9.8 mg/dL (ref 8.6–10.4)
Chloride: 106 mmol/L (ref 98–110)
Creat: 0.7 mg/dL (ref 0.60–0.93)
Globulin: 2.2 g/dL (calc) (ref 1.9–3.7)
Glucose, Bld: 103 mg/dL — ABNORMAL HIGH (ref 65–99)
Potassium: 3.9 mmol/L (ref 3.5–5.3)
Sodium: 142 mmol/L (ref 135–146)
Total Bilirubin: 0.9 mg/dL (ref 0.2–1.2)
Total Protein: 6.6 g/dL (ref 6.1–8.1)

## 2020-12-10 LAB — THYROID PANEL WITH TSH
Free Thyroxine Index: 1.8 (ref 1.4–3.8)
T3 Uptake: 26 % (ref 22–35)
T4, Total: 6.9 ug/dL (ref 5.1–11.9)
TSH: 26.79 mIU/L — ABNORMAL HIGH (ref 0.40–4.50)

## 2020-12-11 ENCOUNTER — Telehealth: Payer: Self-pay | Admitting: Unknown Physician Specialty

## 2020-12-11 MED ORDER — LEVOTHYROXINE SODIUM 150 MCG PO TABS: 150.0000 ug | ORAL_TABLET | Freq: Every day | ORAL | 0 refills | Status: DC

## 2020-12-11 NOTE — Telephone Encounter (Signed)
Called pt for high TSH.  Currently taking 137 mcgs of Levothyroxine.  Will increase to 150 mcgs.  Pt told she may not see an effect for 6 weeks.  Will f/u in 6 weeks.  Consider referral to Endocrine

## 2020-12-19 ENCOUNTER — Other Ambulatory Visit: Payer: Self-pay

## 2020-12-19 DIAGNOSIS — I10 Essential (primary) hypertension: Secondary | ICD-10-CM

## 2020-12-19 MED ORDER — METOPROLOL SUCCINATE ER 50 MG PO TB24: ORAL_TABLET | ORAL | 0 refills | Status: DC

## 2021-01-02 ENCOUNTER — Other Ambulatory Visit: Payer: Self-pay

## 2021-01-02 DIAGNOSIS — I10 Essential (primary) hypertension: Secondary | ICD-10-CM

## 2021-01-02 MED ORDER — LOSARTAN POTASSIUM 50 MG PO TABS: ORAL_TABLET | ORAL | 3 refills | Status: DC

## 2021-01-21 ENCOUNTER — Other Ambulatory Visit: Payer: Self-pay | Admitting: Family Medicine

## 2021-01-21 DIAGNOSIS — I10 Essential (primary) hypertension: Secondary | ICD-10-CM

## 2021-01-21 NOTE — Telephone Encounter (Signed)
Pharmacy has script on file from 12/19/2020 and will get script ready for patient .

## 2021-01-21 NOTE — Telephone Encounter (Signed)
Medication Refill - Medication: metoprolol succinate (TOPROL-XL) 50 MG 24 hr tablet     Preferred Pharmacy (with phone number or street name): [id number] [address], [place] Phone:  [phone]  Fax:  [phone]       Agent: Please be advised that RX refills may take up to 3 business days. We ask that you follow-up with your pharmacy.

## 2021-01-29 ENCOUNTER — Other Ambulatory Visit: Payer: Self-pay

## 2021-01-29 ENCOUNTER — Ambulatory Visit: Payer: PPO | Admitting: Unknown Physician Specialty

## 2021-01-29 ENCOUNTER — Ambulatory Visit (INDEPENDENT_AMBULATORY_CARE_PROVIDER_SITE_OTHER): Payer: PPO | Admitting: Internal Medicine

## 2021-01-29 ENCOUNTER — Encounter: Payer: Self-pay | Admitting: Internal Medicine

## 2021-01-29 VITALS — BP 131/62 | HR 75 | Temp 97.8°F | Resp 17 | Ht 64.0 in | Wt 171.8 lb

## 2021-01-29 DIAGNOSIS — R5383 Other fatigue: Secondary | ICD-10-CM

## 2021-01-29 DIAGNOSIS — R152 Fecal urgency: Secondary | ICD-10-CM

## 2021-01-29 DIAGNOSIS — E7800 Pure hypercholesterolemia, unspecified: Secondary | ICD-10-CM

## 2021-01-29 DIAGNOSIS — R6883 Chills (without fever): Secondary | ICD-10-CM | POA: Diagnosis not present

## 2021-01-29 DIAGNOSIS — E213 Hyperparathyroidism, unspecified: Secondary | ICD-10-CM

## 2021-01-29 DIAGNOSIS — M85852 Other specified disorders of bone density and structure, left thigh: Secondary | ICD-10-CM

## 2021-01-29 DIAGNOSIS — F5104 Psychophysiologic insomnia: Secondary | ICD-10-CM | POA: Diagnosis not present

## 2021-01-29 DIAGNOSIS — R159 Full incontinence of feces: Secondary | ICD-10-CM

## 2021-01-29 DIAGNOSIS — M19011 Primary osteoarthritis, right shoulder: Secondary | ICD-10-CM

## 2021-01-29 DIAGNOSIS — G2581 Restless legs syndrome: Secondary | ICD-10-CM

## 2021-01-29 DIAGNOSIS — M19012 Primary osteoarthritis, left shoulder: Secondary | ICD-10-CM

## 2021-01-29 DIAGNOSIS — E039 Hypothyroidism, unspecified: Secondary | ICD-10-CM | POA: Diagnosis not present

## 2021-01-29 DIAGNOSIS — F419 Anxiety disorder, unspecified: Secondary | ICD-10-CM

## 2021-01-29 DIAGNOSIS — I10 Essential (primary) hypertension: Secondary | ICD-10-CM

## 2021-01-29 DIAGNOSIS — J449 Chronic obstructive pulmonary disease, unspecified: Secondary | ICD-10-CM

## 2021-01-29 NOTE — Assessment & Plan Note (Signed)
Controlled on Amlodipine, Losartan and Metoprolol CMET today Reinforced DASH diet and exercise for weight loss

## 2021-01-29 NOTE — Assessment & Plan Note (Signed)
Taking MVI OTC Encouraged daily weight bearing exercise

## 2021-01-29 NOTE — Assessment & Plan Note (Signed)
TSH and Free T4 today Will adjust Levothyroxine if needed based on labs 

## 2021-01-29 NOTE — Progress Notes (Signed)
Subjective:    Patient ID: [name], female    DOB: 01/20/1941, 80 y.o.   MRN: [id number]  HPI  Patient presents the clinic today for follow-up of chronic conditions.  She is establishing care with me today, transferring care from [facility], NP.  HTN: Her BP today is 131/62.  She is taking Amlodipine, Losartan and Metoprolol as prescribed.  ECG from 05/2019 reviewed.  HLD: Her last LDL was 132, triglycerides 124, 10/2018.  She is not taking any cholesterol-lowering medication at this time.  She tries to consume a low-fat diet.  Hypothyroidism: She reports persistent fatigue, cold intolerance and chills.  She is taking Levothyroxine as prescribed.  She does not follow with endocrinology.  Hyperparathyroidism: Her last calcium was 9.8%, 11/2020.  She is status post parathyroidectomy.  She does not follow with endocrinology.  COPD: She denies chronic cough or SOB. She is not using any inhalers. There are no PFT's on file.   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  She takes Tylenol as needed with good relief of symptoms.  Osteopenia: She had a nuclear medicine bone scan in 2015.  She is taking a MVI daily.  She tries to get weightbearing exercise and daily.  Anxiety: Chronic, managed on Citalopram.  She is not currently seeing a therapist.  She denies depression, SI/HI.   Insomnia: She has difficulty falling asleep and staying asleep.  She is taking Trazodone as prescribed with good relief of symptoms.  There is no sleep study on file.    RLS: Managed on Ropinerole.   She also reports fecal incontinence with black stools. She reports this started 6 months ago. She reports she has fecal urgency with incontinence at times. She can not attribute this to specific foods that she eats. She is not taking Pepto Bismol or iron OTC. She denies weight loss, nausea, vomiting, constipation, diarrhea or blood in her stool. She has not had a colonoscopy in 10 years. She has no family history of colon  cancer. She has not seen GI for this issue.  Review of Systems      Past Medical History:  Diagnosis Date  . Allergy   . Anemia   . Anxiety   . Cancer ([place])    skin cancer  . Hyperlipidemia   . Hypertension   . Hypothyroidism   . Sciatica   . Sleep apnea    does not use cpap  . Thyroid disease     Current Outpatient Medications  Medication Sig Dispense Refill  . acetaminophen (TYLENOL) 500 MG tablet Take 500 mg by mouth every 6 (six) hours as needed for moderate pain.     Marland Kitchen ALPRAZolam (XANAX) 0.25 MG tablet Take 1 tablet (0.25 mg total) by mouth at bedtime as needed for anxiety or sleep. 30 tablet 5  . amLODipine (NORVASC) 10 MG tablet Take 1 tablet daily 90 tablet 1  . Azelastine HCl 0.15 % SOLN Place 1 spray into both nostrils daily as needed (allergies).     . cetirizine (ZYRTEC) 10 MG tablet TAKE 1 TABLET(10 MG) BY MOUTH DAILY 90 tablet 1  . citalopram (CELEXA) 20 MG tablet TAKE 1 TABLET(20 MG) BY MOUTH AT BEDTIME 90 tablet 1  . levothyroxine (SYNTHROID) 150 MCG tablet Take 1 tablet (150 mcg total) by mouth daily. 90 tablet 0  . losartan (COZAAR) 50 MG tablet TAKE 1 TABLET(50 MG) BY MOUTH DAILY 90 tablet 3  . metoprolol succinate (TOPROL-XL) 50 MG 24 hr tablet TAKE 1 TABLET(50  MG) BY MOUTH EVERY MORNING 90 tablet 0  . rOPINIRole (REQUIP) 0.5 MG tablet TAKE 1 TABLET BY MOUTH EVERY NIGHT 90 tablet 1  . traZODone (DESYREL) 50 MG tablet TAKE 1 TABLET(50 MG) BY MOUTH AT BEDTIME 90 tablet 1   No current facility-administered medications for this visit.    Allergies  Allergen Reactions  . Codeine Nausea Only    Tolerates oxycodone  . Morphine Nausea And Vomiting  . Ace Inhibitors Cough  . Adhesive [Tape] Other (See Comments) and Rash    whelps    Family History  Problem Relation Age of Onset  . Breast cancer Paternal Aunt   . Dementia Mother   . Dementia Sister   . Heart attack Brother   . Diabetes Brother   . Heart attack Son   . Hypercalcemia Neg Hx      Social History   Socioeconomic History  . Marital status: Married    Spouse name: Not on file  . Number of children: Not on file  . Years of education: Not on file  . Highest education level: Not on file  Occupational History  . Occupation: retired  Tobacco Use  . Smoking status: Current Every Day Smoker    Packs/day: 0.50    Years: 40.00    Pack years: 20.00    Types: Cigarettes  . Smokeless tobacco: Never Used  Vaping Use  . Vaping Use: Never used  Substance and Sexual Activity  . Alcohol use: No  . Drug use: No  . Sexual activity: Not Currently  Other Topics Concern  . Not on file  Social History Narrative  . Not on file   Social Determinants of Health   Financial Resource Strain: Low Risk   . Difficulty of Paying Living Expenses: Not hard at all  Food Insecurity: No Food Insecurity  . Worried About Charity fundraiser in the Last Year: Never true  . Ran Out of Food in the Last Year: Never true  Transportation Needs: No Transportation Needs  . Lack of Transportation (Medical): No  . Lack of Transportation (Non-Medical): No  Physical Activity: Inactive  . Days of Exercise per Week: 0 days  . Minutes of Exercise per Session: 0 min  Stress: No Stress Concern Present  . Feeling of Stress : Not at all  Social Connections: Not on file  Intimate Partner Violence: Not on file     Constitutional: Patient reports chronic fatigue and chills.  Denies fever, malaise, headache or abrupt weight changes.  HEENT: Denies eye pain, eye redness, ear pain, ringing in the ears, wax buildup, runny nose, nasal congestion, bloody nose, or sore throat. Respiratory: Denies difficulty breathing, shortness of breath, cough or sputum production.   Cardiovascular: Denies chest pain, chest tightness, palpitations or swelling in the hands or feet.  Gastrointestinal: Pt reports fecal urgency with incontinence. Denies abdominal pain, bloating, constipation, diarrhea or blood in the stool.   GU: Denies urgency, frequency, pain with urination, burning sensation, blood in urine, odor or discharge. Musculoskeletal: Patient reports intermittent joint pain.  Denies decrease in range of motion, difficulty with gait, muscle pain or joint swelling.  Skin: Pt reports cold intolerance. Denies redness, rashes, lesions or ulcercations.  Neurological: Patient reports insomnia.  Denies dizziness, difficulty with memory, difficulty with speech or problems with balance and coordination.  Psych: Patient has a history of anxiety.  Denies depression, SI/HI.  No other specific complaints in a complete review of systems (except as listed in HPI above).  Objective:   Physical Exam  BP 131/62 (BP Location: Left Arm, Patient Position: Sitting, Cuff Size: Normal)   Pulse 75   Temp 97.8 F (36.6 C) (Temporal)   Resp 17   Ht 5\' 4"  (1.626 m)   Wt 171 lb 12.8 oz (77.9 kg)   LMP  (LMP Unknown)   SpO2 99%   BMI 29.49 kg/m   Wt Readings from Last 3 Encounters:  12/04/20 171 lb 9.6 oz (77.8 kg)  11/13/20 169 lb 12.8 oz (77 kg)  09/11/20 165 lb 3.2 oz (74.9 kg)    General: Appears her stated age, well developed, well nourished in NAD. Skin: Warm, dry and intact. No rashes noted. HEENT: Head: normal shape and size; Eyes: sclera white, no icterus, conjunctiva pink, PERRLA and EOMs intact;  Neck:  Neck supple, trachea midline. No masses, lumps or thyromegaly present.  Cardiovascular: Normal rate and rhythm. S1,S2 noted.  No murmur, rubs or gallops noted. Trace nonpitting BLE edema. No carotid bruits noted. Pulmonary/Chest: Normal effort and positive vesicular breath sounds. No respiratory distress. No wheezes, rales or ronchi noted.  Abdomen: Soft and nontender. Normal bowel sounds. No distention or masses noted.  Rectal: Decreased rectal tone. External hemorrhoids noted. Hemoccult negative stool. Musculoskeletal: Decreased external rotation of bilateral shoulders. Normal internal rotation. Negative  drop can test. Pain with palpation over the proximal biceps tendons bilaterally. Strenght 5/5 BUE/BLE. No difficulty with gait.  Neurological: Alert and oriented. Trouble with recall. Psychiatric: Mood and affect normal. Mildly anxious appearing. Judgment and thought content normal.   BMET    Component Value Date/Time   NA 142 12/05/2020 0813   NA 145 (H) 12/21/2017 1017   NA 139 08/26/2012 2035   K 3.9 12/05/2020 0813   K 3.3 (L) 08/26/2012 2035   CL 106 12/05/2020 0813   CL 105 08/26/2012 2035   CO2 28 12/05/2020 0813   CO2 29 08/26/2012 2035   GLUCOSE 103 (H) 12/05/2020 0813   GLUCOSE 97 08/26/2012 2035   BUN 12 12/05/2020 0813   BUN 13 12/21/2017 1017   BUN 13 08/26/2012 2035   CREATININE 0.70 12/05/2020 0813   CALCIUM 9.8 12/05/2020 0813   CALCIUM 9.9 08/26/2012 2035   GFRNONAA 66 09/11/2020 1147   GFRAA 76 09/11/2020 1147    Lipid Panel     Component Value Date/Time   CHOL 207 (H) 11/01/2018 0925   CHOL 181 12/21/2017 1017   TRIG 124 11/01/2018 0925   HDL 52 11/01/2018 0925   HDL 50 12/21/2017 1017   CHOLHDL 4.0 11/01/2018 0925   LDLCALC 132 (H) 11/01/2018 0925    CBC    Component Value Date/Time   WBC 6.9 12/05/2020 0813   RBC 4.73 12/05/2020 0813   HGB 15.5 12/05/2020 0813   HGB 14.8 12/21/2017 1017   HCT 45.5 (H) 12/05/2020 0813   HCT 42.5 12/21/2017 1017   PLT 123 (L) 12/05/2020 0813   PLT 141 (L) 12/21/2017 1017   MCV 96.2 12/05/2020 0813   MCV 93 12/21/2017 1017   MCV 92 08/26/2012 2035   MCH 32.8 12/05/2020 0813   MCHC 34.1 12/05/2020 0813   RDW 13.1 12/05/2020 0813   RDW 12.8 12/21/2017 1017   RDW 13.1 08/26/2012 2035   LYMPHSABS 2,686 09/11/2020 1147   LYMPHSABS 2.5 12/21/2017 1017   EOSABS 118 09/11/2020 1147   EOSABS 0.3 12/21/2017 1017   BASOSABS 59 09/11/2020 1147   BASOSABS 0.0 12/21/2017 1017    Hgb A1C No results found  for: HGBA1C         Assessment & Plan:   Fecal Urgency and Incontinence:  She is wearing  depends Will have her start Metamucil Consider referral to GI for further evaluation of symptoms  Fatigue, Cold Intolerance, Chills:  Will check CBC, TSH, Free T4, Vit D and B12 today Consider sleep study pending labs  RTC in 6 months for your Medicare Wellness Exam   [name], NP This visit occurred during the SARS-CoV-2 public health emergency.  Safety protocols were in place, including screening questions prior to the visit, additional usage of staff PPE, and extensive cleaning of exam room while observing appropriate contact time as indicated for disinfecting solutions.

## 2021-01-29 NOTE — Assessment & Plan Note (Signed)
She declines xray of shoulders at this time Continue Tylenol OTC as needed

## 2021-01-29 NOTE — Assessment & Plan Note (Signed)
Continue Trazadone Will monitor symptoms

## 2021-01-29 NOTE — Assessment & Plan Note (Signed)
Stable on Citalopram, wean not indicated at this time Support offered

## 2021-01-29 NOTE — Patient Instructions (Signed)
Fecal Incontinence Fecal incontinence, also called accidental bowel leakage, is not being able to control your bowels. This condition happens because the nerves or muscles around the anus do not work the way they should. This affects their ability to hold stool (feces). What are the causes? This condition may be caused by:  Damage to the muscles at the end of the rectum (sphincter).  Damage to the nerves that control bowel movements.  Diarrhea.  Chronic constipation.  Pelvic floor dysfunction. This means the muscles in the pelvis do not work well.  Loss of bowel storage capacity. This occurs when the rectum can no longer stretch in size in order to store feces.  Inflammatory bowel disease (IBD), such as Crohn's disease.  Irritable bowel syndrome (IBS). What increases the risk? You are more likely to develop this condition if you:  Were born with bowels or a pelvis that did not form correctly.  Have had rectal surgery.  Have had radiation treatment for certain cancers.  Have been pregnant, had a vaginal delivery, or had surgery that damaged the pelvic floor muscles.  Had a complicated childbirth, spinal cord injury, or other trauma that caused nerve damage.  Have a condition that can affect nerve function, such as diabetes, Parkinson's disease, or multiple sclerosis.  Have a condition where the rectum drops down into the anus or vagina (prolapse).  Are 79 years of age or older. What are the signs or symptoms? The main symptom of this condition is not being able to control your bowels. You also might not be able to get to the bathroom before a bowel movement. How is this diagnosed? This condition is diagnosed with a medical history and physical exam. You may also have other tests, including:  Blood tests.  Urine tests.  A rectal exam.  Ultrasound.  MRI.  Colonoscopy. This is an exam that looks at your large intestine (colon).  Anal manometry. This is a test that  measures the strength of the anal sphincter.  Anal electromyogram (EMG). This is a test that uses small electrodes to check for nerve damage. How is this treated? Treatment for this condition depends on the cause and severity. Treatment may also focus on addressing any underlying causes of this condition. Treatment may include:  Medicines. This may include medicines to: ? Prevent diarrhea. ? Help with constipation (bulk-forming laxatives). ? Treat any underlying conditions.  Biofeedback therapy. This can help to retrain muscles that are affected.  Fiber supplements. These can help manage your bowel movements.  Nerve stimulation.  Injectable gel to promote tissue growth and better muscle control.  Surgery. You may need: ? Sphincter repair surgery. ? Diversion surgery. This procedure lets feces pass out of your body through a hole in your abdomen. Follow these instructions at home: Eating and drinking  Follow instructions from your health care provider about any eating or drinking restrictions. ? Work with a dietitian to come up with a healthy diet that will help you avoid the foods that can make your condition worse. ? Keep a diet diary to find out which foods or drinks could be making your condition worse.  Drink enough fluid to keep your urine pale yellow.   Lifestyle  Do not use any products that contain nicotine or tobacco, such as cigarettes and e-cigarettes. If you need help quitting, ask your health care provider. This may help your condition.  If you are overweight, talk with your health care provider about how to safely lose weight. This may  help your condition.  Increase your physical activity as told by your health care provider. This may help your condition. Always talk with your health care provider before starting a new exercise program.  Carry a change of clothes and supplies to clean up quickly if you have an episode of fetal incontinence.  Consider joining a  fecal incontinence support group. You can find a support group online or in your local community. General instructions  Take over-the-counter and prescription medicines only as told by your health care provider. This includes any supplements.  Apply a moisture barrier, such as petroleum jelly, to your rectum. This protects the skin from irritation caused by ongoing leaking or diarrhea.  Tell your health care provider if you are upset or depressed about your condition.  Keep all follow-up visits as told by your health care provider. This is important.   Where to find more information  [employer] Foundation for Functional Gastrointestinal Disorders: iffgd.[place] [facility]: patients.gi.org Contact a health care provider if:  You have a fever.  You have redness, swelling, or pain around your rectum.  Your pain is getting worse or you lose feeling in your rectal area.  You have blood in your stool.  You feel sad or hopeless.  You avoid social or work situations. Get help right away if:  You stop having bowel movements.  You cannot eat or drink without vomiting.  You have rectal bleeding that does not stop.  You have severe pain that is getting worse.  You have symptoms of dehydration, including: ? Sleepiness or fatigue. ? Producing little or no urine, tears, or sweat. ? Dizziness. ? Dry mouth. ? Unusual irritability. ? Headache. ? Inability to think clearly. Summary  Fecal incontinence, also called accidental bowel leakage, is not being able to control your bowels. This condition happens because the nerves or muscles around the anus do not work the way they should.  Treatment varies depending on the cause and severity of your condition. Treatment may also focus on addressing any underlying causes of this condition.  Follow instructions from your health care provider about any eating or drinking restrictions, lifestyle changes, and skin  care.  Take over-the-counter and prescription medicines only as told by your health care provider. This includes any supplements.  Tell your health care provider if your symptoms worsen or if you are upset or depressed about your condition. This information is not intended to replace advice given to you by your health care provider. Make sure you discuss any questions you have with your health care provider. Document Revised: 01/21/2018 Document Reviewed: 01/21/2018 Elsevier Patient Education  2021 [employer].

## 2021-01-29 NOTE — Assessment & Plan Note (Signed)
Encouraged smoking cessation Will monitor symptoms

## 2021-01-29 NOTE — Assessment & Plan Note (Signed)
Continue Ropinerole 

## 2021-01-29 NOTE — Assessment & Plan Note (Signed)
S/p parathyroidectomy Will monitor Calcium

## 2021-01-29 NOTE — Assessment & Plan Note (Signed)
CMET and Lipid profile today Encouraged her to consume a low fat diet 

## 2021-01-30 LAB — CBC WITH DIFFERENTIAL/PLATELET
Absolute Monocytes: 741 cells/uL (ref 200–950)
Basophils Absolute: 70 cells/uL (ref 0–200)
Basophils Relative: 0.9 %
Eosinophils Absolute: 109 cells/uL (ref 15–500)
Eosinophils Relative: 1.4 %
HCT: 47.4 % — ABNORMAL HIGH (ref 35.0–45.0)
Hemoglobin: 15.9 g/dL — ABNORMAL HIGH (ref 11.7–15.5)
Lymphs Abs: 2473 cells/uL (ref 850–3900)
MCH: 32.6 pg (ref 27.0–33.0)
MCHC: 33.5 g/dL (ref 32.0–36.0)
MCV: 97.1 fL (ref 80.0–100.0)
MPV: 12.8 fL — ABNORMAL HIGH (ref 7.5–12.5)
Monocytes Relative: 9.5 %
Neutro Abs: 4407 cells/uL (ref 1500–7800)
Neutrophils Relative %: 56.5 %
Platelets: 123 10*3/uL — ABNORMAL LOW (ref 140–400)
RBC: 4.88 10*6/uL (ref 3.80–5.10)
RDW: 12.5 % (ref 11.0–15.0)
Total Lymphocyte: 31.7 %
WBC: 7.8 10*3/uL (ref 3.8–10.8)

## 2021-01-30 LAB — COMPREHENSIVE METABOLIC PANEL
AG Ratio: 1.9 (calc) (ref 1.0–2.5)
ALT: 18 U/L (ref 6–29)
AST: 16 U/L (ref 10–35)
Albumin: 4.6 g/dL (ref 3.6–5.1)
Alkaline phosphatase (APISO): 68 U/L (ref 37–153)
BUN: 15 mg/dL (ref 7–25)
CO2: 29 mmol/L (ref 20–32)
Calcium: 10.1 mg/dL (ref 8.6–10.4)
Chloride: 109 mmol/L (ref 98–110)
Creat: 0.74 mg/dL (ref 0.60–0.93)
Globulin: 2.4 g/dL (calc) (ref 1.9–3.7)
Glucose, Bld: 96 mg/dL (ref 65–99)
Potassium: 3.7 mmol/L (ref 3.5–5.3)
Sodium: 144 mmol/L (ref 135–146)
Total Bilirubin: 0.7 mg/dL (ref 0.2–1.2)
Total Protein: 7 g/dL (ref 6.1–8.1)

## 2021-01-30 LAB — VITAMIN D 25 HYDROXY (VIT D DEFICIENCY, FRACTURES): Vit D, 25-Hydroxy: 31 ng/mL (ref 30–100)

## 2021-01-30 LAB — TSH: TSH: 0.41 mIU/L (ref 0.40–4.50)

## 2021-01-30 LAB — T4, FREE: Free T4: 1.5 ng/dL (ref 0.8–1.8)

## 2021-01-30 LAB — VITAMIN B12: Vitamin B-12: 398 pg/mL (ref 200–1100)

## 2021-02-04 ENCOUNTER — Ambulatory Visit (INDEPENDENT_AMBULATORY_CARE_PROVIDER_SITE_OTHER): Payer: PPO | Admitting: Internal Medicine

## 2021-02-04 ENCOUNTER — Encounter: Payer: Self-pay | Admitting: Internal Medicine

## 2021-02-04 ENCOUNTER — Other Ambulatory Visit: Payer: Self-pay

## 2021-02-04 VITALS — BP 145/66 | HR 68 | Temp 97.5°F | Wt 171.0 lb

## 2021-02-04 DIAGNOSIS — I10 Essential (primary) hypertension: Secondary | ICD-10-CM | POA: Diagnosis not present

## 2021-02-04 DIAGNOSIS — Z5181 Encounter for therapeutic drug level monitoring: Secondary | ICD-10-CM

## 2021-02-04 MED ORDER — LOSARTAN POTASSIUM 50 MG PO TABS: ORAL_TABLET | ORAL | 3 refills | Status: DC

## 2021-02-04 NOTE — Progress Notes (Signed)
Subjective:    Patient ID: [name], female    DOB: 23-Sep-1940, 80 y.o.   MRN: [id number]  HPI  Pt presents to the clinic today for medication review. She has no idea if the medications in her bag are the ones she should be taking, or if she is missing any medications. She reports she has so many bottles at home, she doesn't know what is what.     Review of Systems      Past Medical History:  Diagnosis Date  . Allergy   . Anemia   . Anxiety   . Cancer ([place])    skin cancer  . Hyperlipidemia   . Hypertension   . Hypothyroidism   . Sciatica   . Sleep apnea    does not use cpap  . Thyroid disease     Current Outpatient Medications  Medication Sig Dispense Refill  . acetaminophen (TYLENOL) 500 MG tablet Take 500 mg by mouth every 6 (six) hours as needed for moderate pain.     Marland Kitchen amLODipine (NORVASC) 10 MG tablet Take 1 tablet daily 90 tablet 1  . cetirizine (ZYRTEC) 10 MG tablet TAKE 1 TABLET(10 MG) BY MOUTH DAILY 90 tablet 1  . citalopram (CELEXA) 20 MG tablet TAKE 1 TABLET(20 MG) BY MOUTH AT BEDTIME (Patient taking differently: Take 10 mg by mouth at bedtime. TAKE 1 TABLET(20 MG) BY MOUTH AT BEDTIME) 90 tablet 1  . levothyroxine (SYNTHROID) 150 MCG tablet Take 1 tablet (150 mcg total) by mouth daily. 90 tablet 0  . metoprolol succinate (TOPROL-XL) 50 MG 24 hr tablet TAKE 1 TABLET(50 MG) BY MOUTH EVERY MORNING 90 tablet 0  . rOPINIRole (REQUIP) 0.5 MG tablet TAKE 1 TABLET BY MOUTH EVERY NIGHT 90 tablet 1  . simvastatin (ZOCOR) 40 MG tablet Take 40 mg by mouth daily.    . traZODone (DESYREL) 50 MG tablet TAKE 1 TABLET(50 MG) BY MOUTH AT BEDTIME 90 tablet 1  . losartan (COZAAR) 50 MG tablet TAKE 1 TABLET(50 MG) BY MOUTH DAILY 90 tablet 3   No current facility-administered medications for this visit.    Allergies  Allergen Reactions  . Codeine Nausea Only    Tolerates oxycodone  . Morphine Nausea And Vomiting  . Ace Inhibitors Cough  . Adhesive [Tape] Other (See  Comments) and Rash    whelps    Family History  Problem Relation Age of Onset  . Breast cancer Paternal Aunt   . Dementia Mother   . Dementia Sister   . Heart attack Brother   . Diabetes Brother   . Heart attack Son   . Hypercalcemia Neg Hx     Social History   Socioeconomic History  . Marital status: Married    Spouse name: Not on file  . Number of children: Not on file  . Years of education: Not on file  . Highest education level: Not on file  Occupational History  . Occupation: retired  Tobacco Use  . Smoking status: Current Every Day Smoker    Packs/day: 0.50    Years: 40.00    Pack years: 20.00    Types: Cigarettes  . Smokeless tobacco: Never Used  Vaping Use  . Vaping Use: Never used  Substance and Sexual Activity  . Alcohol use: No  . Drug use: No  . Sexual activity: Not Currently  Other Topics Concern  . Not on file  Social History Narrative  . Not on file   Social Determinants of Health  Financial Resource Strain: Low Risk   . Difficulty of Paying Living Expenses: Not hard at all  Food Insecurity: No Food Insecurity  . Worried About Charity fundraiser in the Last Year: Never true  . Ran Out of Food in the Last Year: Never true  Transportation Needs: No Transportation Needs  . Lack of Transportation (Medical): No  . Lack of Transportation (Non-Medical): No  Physical Activity: Inactive  . Days of Exercise per Week: 0 days  . Minutes of Exercise per Session: 0 min  Stress: No Stress Concern Present  . Feeling of Stress : Not at all  Social Connections: Not on file  Intimate Partner Violence: Not on file     Constitutional: Denies fever, malaise, fatigue, headache or abrupt weight changes.  Respiratory: Denies difficulty breathing, shortness of breath, cough or sputum production.   Cardiovascular: Denies chest pain, chest tightness, palpitations or swelling in the hands or feet.  Neurological: Denies dizziness, difficulty with memory,  difficulty with speech or problems with balance and coordination.  Psych: Pt has a history of an anxiety. Denies depression, SI/HI.  No other specific complaints in a complete review of systems (except as listed in HPI above).  Objective:   Physical Exam  BP (!) 145/66 (BP Location: Right Arm, Patient Position: Sitting, Cuff Size: Large)   Pulse 68   Temp (!) 97.5 F (36.4 C) (Temporal)   Wt 171 lb (77.6 kg)   LMP  (LMP Unknown)   SpO2 99%   BMI 29.35 kg/m  Wt Readings from Last 3 Encounters:  02/04/21 171 lb (77.6 kg)  01/29/21 171 lb 12.8 oz (77.9 kg)  12/04/20 171 lb 9.6 oz (77.8 kg)    General: Appears her stated age, well developed, well nourished in NAD. HEENT: Head: normal shape and size; Eyes: sclera white and EOMs intact;  Cardiovascular: Normal rate and rhythm.  Pulmonary/Chest: Normal effort and positive vesicular breath sounds.  Musculoskeletal: No difficulty with gait.  Neurological: Alert and oriented. She has obvious trouble with recall. Psychiatric: Mood and affect normal. Behavior is normal. Judgment and thought content normal.     BMET    Component Value Date/Time   NA 144 01/29/2021 1028   NA 145 (H) 12/21/2017 1017   NA 139 08/26/2012 2035   K 3.7 01/29/2021 1028   K 3.3 (L) 08/26/2012 2035   CL 109 01/29/2021 1028   CL 105 08/26/2012 2035   CO2 29 01/29/2021 1028   CO2 29 08/26/2012 2035   GLUCOSE 96 01/29/2021 1028   GLUCOSE 97 08/26/2012 2035   BUN 15 01/29/2021 1028   BUN 13 12/21/2017 1017   BUN 13 08/26/2012 2035   CREATININE 0.74 01/29/2021 1028   CALCIUM 10.1 01/29/2021 1028   CALCIUM 9.9 08/26/2012 2035   GFRNONAA 66 09/11/2020 1147   GFRAA 76 09/11/2020 1147    Lipid Panel     Component Value Date/Time   CHOL 207 (H) 11/01/2018 0925   CHOL 181 12/21/2017 1017   TRIG 124 11/01/2018 0925   HDL 52 11/01/2018 0925   HDL 50 12/21/2017 1017   CHOLHDL 4.0 11/01/2018 0925   LDLCALC 132 (H) 11/01/2018 0925    CBC    Component  Value Date/Time   WBC 7.8 01/29/2021 1028   RBC 4.88 01/29/2021 1028   HGB 15.9 (H) 01/29/2021 1028   HGB 14.8 12/21/2017 1017   HCT 47.4 (H) 01/29/2021 1028   HCT 42.5 12/21/2017 1017   PLT 123 (L) 01/29/2021  1028   PLT 141 (L) 12/21/2017 1017   MCV 97.1 01/29/2021 1028   MCV 93 12/21/2017 1017   MCV 92 08/26/2012 2035   MCH 32.6 01/29/2021 1028   MCHC 33.5 01/29/2021 1028   RDW 12.5 01/29/2021 1028   RDW 12.8 12/21/2017 1017   RDW 13.1 08/26/2012 2035   LYMPHSABS 2,473 01/29/2021 1028   LYMPHSABS 2.5 12/21/2017 1017   EOSABS 109 01/29/2021 1028   EOSABS 0.3 12/21/2017 1017   BASOSABS 70 01/29/2021 1028   BASOSABS 0.0 12/21/2017 1017    Hgb A1C No results found for: HGBA1C          Assessment & Plan:   Medication Monitoring Encounter, HTN:  She has all the prescriptions on her list except Losartan Losartan sent to pharmacy Advised her if she has any other RX pill bottles at home, to throw them away so she doesn't get confused  Return precautions discussed [name], NP This visit occurred during the SARS-CoV-2 public health emergency.  Safety protocols were in place, including screening questions prior to the visit, additional usage of staff PPE, and extensive cleaning of exam room while observing appropriate contact time as indicated for disinfecting solutions.

## 2021-02-04 NOTE — Patient Instructions (Signed)

## 2021-02-27 ENCOUNTER — Other Ambulatory Visit: Payer: Self-pay

## 2021-02-27 DIAGNOSIS — J302 Other seasonal allergic rhinitis: Secondary | ICD-10-CM

## 2021-02-27 MED ORDER — CETIRIZINE HCL 10 MG PO TABS: ORAL_TABLET | ORAL | 1 refills | Status: DC

## 2021-03-18 ENCOUNTER — Other Ambulatory Visit: Payer: Self-pay

## 2021-03-18 ENCOUNTER — Ambulatory Visit (INDEPENDENT_AMBULATORY_CARE_PROVIDER_SITE_OTHER): Payer: PPO | Admitting: Internal Medicine

## 2021-03-18 ENCOUNTER — Encounter: Payer: Self-pay | Admitting: Internal Medicine

## 2021-03-18 VITALS — BP 134/67 | HR 66 | Temp 96.9°F | Wt 175.0 lb

## 2021-03-18 DIAGNOSIS — Z6826 Body mass index (BMI) 26.0-26.9, adult: Secondary | ICD-10-CM | POA: Insufficient documentation

## 2021-03-18 DIAGNOSIS — R152 Fecal urgency: Secondary | ICD-10-CM | POA: Diagnosis not present

## 2021-03-18 DIAGNOSIS — R609 Edema, unspecified: Secondary | ICD-10-CM | POA: Diagnosis not present

## 2021-03-18 DIAGNOSIS — Z6828 Body mass index (BMI) 28.0-28.9, adult: Secondary | ICD-10-CM | POA: Insufficient documentation

## 2021-03-18 DIAGNOSIS — E6609 Other obesity due to excess calories: Secondary | ICD-10-CM | POA: Diagnosis not present

## 2021-03-18 DIAGNOSIS — Z6829 Body mass index (BMI) 29.0-29.9, adult: Secondary | ICD-10-CM | POA: Insufficient documentation

## 2021-03-18 DIAGNOSIS — R195 Other fecal abnormalities: Secondary | ICD-10-CM | POA: Diagnosis not present

## 2021-03-18 DIAGNOSIS — Z6830 Body mass index (BMI) 30.0-30.9, adult: Secondary | ICD-10-CM | POA: Diagnosis not present

## 2021-03-18 DIAGNOSIS — E663 Overweight: Secondary | ICD-10-CM | POA: Insufficient documentation

## 2021-03-18 DIAGNOSIS — E66811 Obesity, class 1: Secondary | ICD-10-CM

## 2021-03-18 DIAGNOSIS — R600 Localized edema: Secondary | ICD-10-CM

## 2021-03-18 MED ORDER — LEVOTHYROXINE SODIUM 150 MCG PO TABS: 150.0000 ug | ORAL_TABLET | Freq: Every day | ORAL | 0 refills | Status: DC

## 2021-03-18 NOTE — Assessment & Plan Note (Signed)
Encouraged diet and exercise for weight loss ?

## 2021-03-18 NOTE — Progress Notes (Signed)
Subjective:    Patient ID: [name], female    DOB: 07/25/1941, 80 y.o.   MRN: [id number]  HPI  Patient presents the clinic today with fecal urgency, loose and dark stools.  This has been an ongoing issue for her.  She was seen for the same 11/2020.  She was advised to start Metamucil which she did not start. She is not taking Bismuth or Iron OTC. At her last appt, she had heme negative stool  She has been taking this and denies any improvement in her symptoms.  There is no colonoscopy on file.  She also reports swelling in her feet. She noticed this a few months ago. The swelling is not painful but is uncomfortable. She has not noticed any redness. She denies chronic cough or SOB. She is taking Amlodipine.  Review of Systems     Past Medical History:  Diagnosis Date   Allergy    Anemia    Anxiety    Cancer ([place])    skin cancer   Hyperlipidemia    Hypertension    Hypothyroidism    Sciatica    Sleep apnea    does not use cpap   Thyroid disease     Current Outpatient Medications  Medication Sig Dispense Refill   acetaminophen (TYLENOL) 500 MG tablet Take 500 mg by mouth every 6 (six) hours as needed for moderate pain.      amLODipine (NORVASC) 10 MG tablet Take 1 tablet daily 90 tablet 1   citalopram (CELEXA) 20 MG tablet TAKE 1 TABLET(20 MG) BY MOUTH AT BEDTIME (Patient taking differently: Take 10 mg by mouth at bedtime. TAKE 1 TABLET(20 MG) BY MOUTH AT BEDTIME) 90 tablet 1   levothyroxine (SYNTHROID) 150 MCG tablet Take 1 tablet (150 mcg total) by mouth daily. 90 tablet 0   losartan (COZAAR) 50 MG tablet TAKE 1 TABLET(50 MG) BY MOUTH DAILY 90 tablet 3   metoprolol succinate (TOPROL-XL) 50 MG 24 hr tablet TAKE 1 TABLET(50 MG) BY MOUTH EVERY MORNING 90 tablet 0   rOPINIRole (REQUIP) 0.5 MG tablet TAKE 1 TABLET BY MOUTH EVERY NIGHT 90 tablet 1   simvastatin (ZOCOR) 40 MG tablet Take 40 mg by mouth daily.     traZODone (DESYREL) 50 MG tablet TAKE 1 TABLET(50 MG) BY MOUTH  AT BEDTIME 90 tablet 1   cetirizine (ZYRTEC) 10 MG tablet TAKE 1 TABLET(10 MG) BY MOUTH DAILY (Patient not taking: Reported on 03/18/2021) 90 tablet 1   No current facility-administered medications for this visit.    Allergies  Allergen Reactions   Codeine Nausea Only    Tolerates oxycodone   Morphine Nausea And Vomiting   Ace Inhibitors Cough   Adhesive [Tape] Other (See Comments) and Rash    whelps    Family History  Problem Relation Age of Onset   Breast cancer Paternal Aunt    Dementia Mother    Dementia Sister    Heart attack Brother    Diabetes Brother    Heart attack Son    Hypercalcemia Neg Hx     Social History   Socioeconomic History   Marital status: Married    Spouse name: Not on file   Number of children: Not on file   Years of education: Not on file   Highest education level: Not on file  Occupational History   Occupation: retired  Tobacco Use   Smoking status: Every Day    Packs/day: 0.50    Years: 40.00  Pack years: 20.00    Types: Cigarettes   Smokeless tobacco: Never  Vaping Use   Vaping Use: Never used  Substance and Sexual Activity   Alcohol use: No   Drug use: No   Sexual activity: Not Currently  Other Topics Concern   Not on file  Social History Narrative   Not on file   Social Determinants of Health   Financial Resource Strain: Low Risk    Difficulty of Paying Living Expenses: Not hard at all  Food Insecurity: No Food Insecurity   Worried About Charity fundraiser in the Last Year: Never true   [place] in the Last Year: Never true  Transportation Needs: No Transportation Needs   Lack of Transportation (Medical): No   Lack of Transportation (Non-Medical): No  Physical Activity: Inactive   Days of Exercise per Week: 0 days   Minutes of Exercise per Session: 0 min  Stress: No Stress Concern Present   Feeling of Stress : Not at all  Social Connections: Not on file  Intimate Partner Violence: Not on file      Constitutional: Denies fever, malaise, fatigue, headache or abrupt weight changes.  Respiratory: Denies difficulty breathing, shortness of breath, cough or sputum production.   Cardiovascular: Pt reports swelling in feet. Denies chest pain, chest tightness, palpitations or swelling in the hands.  Gastrointestinal:  Patient reports fecal urgency, loose stools, dark stools. Denies abdominal pain, bloating, constipation, or blood in the stool.  Skin: Denies redness, rashes, lesions or ulcercations.   No other specific complaints in a complete review of systems (except as listed in HPI above).  Objective:   Physical Exam   BP 134/67 (BP Location: Right Arm, Patient Position: Sitting, Cuff Size: Large)   Pulse 66   Temp (!) 96.9 F (36.1 C) (Oral)   Wt 175 lb (79.4 kg)   LMP  (LMP Unknown)   SpO2 98%   BMI 30.04 kg/m   Wt Readings from Last 3 Encounters:  02/04/21 171 lb (77.6 kg)  01/29/21 171 lb 12.8 oz (77.9 kg)  12/04/20 171 lb 9.6 oz (77.8 kg)    General: Appears their stated age, obese, in NAD. Skin: Warm, dry and intact.  Cardiovascular: Normal rate and rhythm. S1,S2 noted.  No murmur, rubs or gallops noted. 1+ nonpitting BLE edema.  Pulmonary/Chest: Normal effort and positive vesicular breath sounds. No respiratory distress. No wheezes, rales or ronchi noted.  Abdomen: Soft and nontender. Hypoactive bowel sounds. No distention or masses noted.  Musculoskeletal: No difficulty with gait.    BMET    Component Value Date/Time   NA 144 01/29/2021 1028   NA 145 (H) 12/21/2017 1017   NA 139 08/26/2012 2035   K 3.7 01/29/2021 1028   K 3.3 (L) 08/26/2012 2035   CL 109 01/29/2021 1028   CL 105 08/26/2012 2035   CO2 29 01/29/2021 1028   CO2 29 08/26/2012 2035   GLUCOSE 96 01/29/2021 1028   GLUCOSE 97 08/26/2012 2035   BUN 15 01/29/2021 1028   BUN 13 12/21/2017 1017   BUN 13 08/26/2012 2035   CREATININE 0.74 01/29/2021 1028   CALCIUM 10.1 01/29/2021 1028    CALCIUM 9.9 08/26/2012 2035   GFRNONAA 66 09/11/2020 1147   GFRAA 76 09/11/2020 1147    Lipid Panel     Component Value Date/Time   CHOL 207 (H) 11/01/2018 0925   CHOL 181 12/21/2017 1017   TRIG 124 11/01/2018 0925   HDL 52  11/01/2018 0925   HDL 50 12/21/2017 1017   CHOLHDL 4.0 11/01/2018 0925   LDLCALC 132 (H) 11/01/2018 0925    CBC    Component Value Date/Time   WBC 7.8 01/29/2021 1028   RBC 4.88 01/29/2021 1028   HGB 15.9 (H) 01/29/2021 1028   HGB 14.8 12/21/2017 1017   HCT 47.4 (H) 01/29/2021 1028   HCT 42.5 12/21/2017 1017   PLT 123 (L) 01/29/2021 1028   PLT 141 (L) 12/21/2017 1017   MCV 97.1 01/29/2021 1028   MCV 93 12/21/2017 1017   MCV 92 08/26/2012 2035   MCH 32.6 01/29/2021 1028   MCHC 33.5 01/29/2021 1028   RDW 12.5 01/29/2021 1028   RDW 12.8 12/21/2017 1017   RDW 13.1 08/26/2012 2035   LYMPHSABS 2,473 01/29/2021 1028   LYMPHSABS 2.5 12/21/2017 1017   EOSABS 109 01/29/2021 1028   EOSABS 0.3 12/21/2017 1017   BASOSABS 70 01/29/2021 1028   BASOSABS 0.0 12/21/2017 1017    Hgb A1C No results found for: HGBA1C         Assessment & Plan:   Fecal Urgency, Loose Stools, Dark Stools:  Referral to GI for further evaluation  Peripheral Edema:  Reinforced DASH diet Will have her schedule lab appt for BNP Consider echocardiogram for further evaluation pending labs Encouraged elevation Consider changing Amlodipine to see if swelling improves  Will follow up after labs, return precautions discussed  [name], NP  This visit occurred during the SARS-CoV-2 public health emergency.  Safety protocols were in place, including screening questions prior to the visit, additional usage of staff PPE, and extensive cleaning of exam room while observing appropriate contact time as indicated for disinfecting solutions.

## 2021-03-18 NOTE — Patient Instructions (Signed)
Peripheral Edema Peripheral edema is swelling that is caused by a buildup of fluid. Peripheral edema most often affects the lower legs, ankles, and feet. It can also develop in the arms, hands, and face. The area of the body that has peripheral edema will look swollen. It may also feel heavy or warm. Your clothes may start to feel tight. Pressing on the area may make a temporary dent in your skin. You may not be able to move your swollen arm or leg as much as usual. There are many causes of peripheral edema. It can happen because of a complication of other conditions such as congestive heart failure, kidney disease, or a problem with your blood circulation. It also can be a side effect of certain medicines or because of an infection. It often happens to women during pregnancy. Sometimes, the cause is not known. Follow these instructions at home: Managing pain, stiffness, and swelling  Raise (elevate) your legs while you are sitting or lying down. Move around often to prevent stiffness and to lessen swelling. Do not sit or stand for long periods of time. Wear support stockings as told by your health care provider. Medicines Take over-the-counter and prescription medicines only as told by your health care provider. Your health care provider may prescribe medicine to help your body get rid of excess water (diuretic). General instructions Pay attention to any changes in your symptoms. Follow instructions from your health care provider about limiting salt (sodium) in your diet. Sometimes, eating less salt may reduce swelling. Moisturize skin daily to help prevent skin from cracking and draining. Keep all follow-up visits as told by your health care provider. This is important. Contact a health care provider if you have: A fever. Edema that starts suddenly or is getting worse, especially if you are pregnant or have a medical condition. Swelling in only one leg. Increased swelling, redness, or pain in  one or both of your legs. Drainage or sores at the area where you have edema. Get help right away if you: Develop shortness of breath, especially when you are lying down. Have pain in your chest or abdomen. Feel weak. Feel faint. Summary Peripheral edema is swelling that is caused by a buildup of fluid. Peripheral edema most often affects the lower legs, ankles, and feet. Move around often to prevent stiffness and to lessen swelling. Do not sit or stand for long periods of time. Pay attention to any changes in your symptoms. Contact a health care provider if you have edema that starts suddenly or is getting worse, especially if you are pregnant or have a medical condition. Get help right away if you develop shortness of breath, especially when lying down. This information is not intended to replace advice given to you by your health care provider. Make sure you discuss any questions you have with your health care provider. Document Revised: 06/02/2018 Document Reviewed: 06/02/2018 Elsevier Patient Education  2022 Elsevier Inc.  

## 2021-03-19 ENCOUNTER — Other Ambulatory Visit (INDEPENDENT_AMBULATORY_CARE_PROVIDER_SITE_OTHER): Payer: PPO

## 2021-03-19 DIAGNOSIS — R609 Edema, unspecified: Secondary | ICD-10-CM | POA: Diagnosis not present

## 2021-03-20 LAB — BRAIN NATRIURETIC PEPTIDE: Brain Natriuretic Peptide: 62 pg/mL (ref <100)

## 2021-03-21 ENCOUNTER — Other Ambulatory Visit: Payer: Self-pay

## 2021-03-21 ENCOUNTER — Ambulatory Visit (INDEPENDENT_AMBULATORY_CARE_PROVIDER_SITE_OTHER): Payer: PPO | Admitting: Gastroenterology

## 2021-03-21 VITALS — BP 147/75 | HR 69 | Temp 97.9°F | Ht 64.0 in | Wt 173.4 lb

## 2021-03-21 DIAGNOSIS — R159 Full incontinence of feces: Secondary | ICD-10-CM | POA: Diagnosis not present

## 2021-03-21 DIAGNOSIS — D696 Thrombocytopenia, unspecified: Secondary | ICD-10-CM | POA: Diagnosis not present

## 2021-03-21 DIAGNOSIS — R197 Diarrhea, unspecified: Secondary | ICD-10-CM

## 2021-03-21 MED ORDER — NA SULFATE-K SULFATE-MG SULF 17.5-3.13-1.6 GM/177ML PO SOLN: 1.0000 | Freq: Once | ORAL | 0 refills | Status: AC

## 2021-03-21 NOTE — Progress Notes (Signed)
[name] [name]  [place], [place] [address]  Main: [phone]  Fax: [phone]   Gastroenterology Consultation  Referring Provider:     [name], NP Primary Care Physician:  [name], NP Reason for Consultation:     Diarrhea, fecal incontinence        HPI:    Chief Complaint  Patient presents with   New Patient (Initial Visit)    Fecal urgency, loose and dark stools... x 6 months... Has also noticed edema UE and LE...last colonoscopy 15-20 yrs ago... pt has not tried OTC med    [name] is a 80 y.o. y/o female referred for consultation & management  by Dr. Garnette Gunner, Coralie Keens, NP.  Patient reports 46-month history of fecal urgency, incontinence, and melena.  Reports having black stools during this time period.  She is wearing black pants today and states it is as black as the color of her pants, and also points to my hair and states it is as the black of the color of your hair.  However, she states it is not sticky or tarry appearing and does not have a foul order or smell to it.  No nausea or vomiting or abdominal pain.  No weight loss.  Denies any previous colonoscopy or any previous colorectal cancer screening.  No dysphagia.  Reports having 3-4 loose bowel movements a day on the days that she is having the episodes.  This occurs about 3-4 times a week.  In between episodes she may not have a bowel movement or have a formed bowel movement about 1 to 2 days out of the week.  No red blood in stool.  Denies any urinary incontinence.  Past Medical History:  Diagnosis Date   Allergy    Anemia    Anxiety    Cancer ([place])    skin cancer   Hyperlipidemia    Hypertension    Hypothyroidism    Sciatica    Sleep apnea    does not use cpap   Thyroid disease     Past Surgical History:  Procedure Laterality Date   ABDOMINAL HYSTERECTOMY     APPENDECTOMY     BACK SURGERY     lower   BILATERAL CARPAL TUNNEL RELEASE      BREAST CYST ASPIRATION Left    neg   COLONOSCOPY     ENTROPIAN REPAIR Left 06/08/2020   Procedure: ENTROPION REPAIR, SUTURES ENTROPION REPAIR, EXTENSIVE LEFT;  Surgeon: Karle Starch, MD;  Location: [facility];  Service: Ophthalmology;  Laterality: Left;   JOINT REPLACEMENT     KNEE SURGERY Bilateral    KNEE SURGERY     OPEN REDUCTION INTERNAL FIXATION (ORIF) DISTAL RADIAL FRACTURE Left 06/23/2019   Procedure: OPEN REDUCTION INTERNAL FIXATION (ORIF) DISTAL RADIAL FRACTURE;  Surgeon: Hessie Knows, MD;  Location: [facility];  Service: Orthopedics;  Laterality: Left;   PARATHYROIDECTOMY  04/27/2020   SPINAL CORD STIMULATOR IMPLANT Right    THYROIDECTOMY Right 04/27/2020   Procedure: PARATHYROIDECTOMY;  Surgeon: Johnathan Hausen, MD;  Location: [facility];  Service: General;  Laterality: Right;   TOTAL KNEE REVISION Right 08/13/2015   Procedure: TOTAL KNEE REVISION;  Surgeon: Paralee Cancel, MD;  Location: [facility];  Service: Orthopedics;  Laterality: Right;    Prior to Admission medications   Medication Sig Start Date End Date Taking? Authorizing Provider  acetaminophen (TYLENOL) 500 MG tablet Take 500 mg by mouth every 6 (six) hours  as needed for moderate pain.    Yes [provider]  amLODipine (NORVASC) 10 MG tablet Take 1 tablet daily 12/04/20  Yes Kathrine Haddock, NP  citalopram (CELEXA) 20 MG tablet TAKE 1 TABLET(20 MG) BY MOUTH AT BEDTIME Patient taking differently: Take 10 mg by mouth at bedtime. TAKE 1 TABLET(20 MG) BY MOUTH AT BEDTIME 10/04/20  Yes Malfi, Lupita Raider, FNP  levothyroxine (SYNTHROID) 150 MCG tablet Take 1 tablet (150 mcg total) by mouth daily. 03/18/21  Yes [name], NP  losartan (COZAAR) 50 MG tablet TAKE 1 TABLET(50 MG) BY MOUTH DAILY 02/04/21  Yes Baity, Coralie Keens, NP  metoprolol succinate (TOPROL-XL) 50 MG 24 hr tablet TAKE 1 TABLET(50 MG) BY MOUTH EVERY MORNING 12/19/20  Yes Karamalegos, Devonne Doughty, DO  rOPINIRole (REQUIP) 0.5 MG tablet TAKE 1 TABLET BY MOUTH  EVERY NIGHT 10/04/20  Yes Malfi, Lupita Raider, FNP  simvastatin (ZOCOR) 40 MG tablet Take 40 mg by mouth daily.   Yes [provider]  traZODone (DESYREL) 50 MG tablet TAKE 1 TABLET(50 MG) BY MOUTH AT BEDTIME 12/04/20  Yes Kathrine Haddock, NP    Family History  Problem Relation Age of Onset   Breast cancer Paternal Aunt    Dementia Mother    Dementia Sister    Heart attack Brother    Diabetes Brother    Heart attack Son    Hypercalcemia Neg Hx      Social History   Tobacco Use   Smoking status: Every Day    Packs/day: 0.50    Years: 40.00    Pack years: 20.00    Types: Cigarettes   Smokeless tobacco: Never  Vaping Use   Vaping Use: Never used  Substance Use Topics   Alcohol use: No   Drug use: No    Allergies as of 03/21/2021 - Review Complete 03/21/2021  Allergen Reaction Noted   Codeine Nausea Only 02/22/2014   Morphine Nausea And Vomiting 06/12/2015   Ace inhibitors Cough 03/16/2018   Adhesive [tape] Other (See Comments) and Rash 02/22/2014    Review of Systems:    All systems reviewed and negative except where noted in HPI.   Physical Exam:  Constitutional: General:   Alert,  Well-developed, well-nourished, pleasant and cooperative in NAD BP (!) 147/75   Pulse 69   Temp 97.9 F (36.6 C) (Oral)   Ht 5\' 4"  (1.626 m)   Wt 173 lb 6.4 oz (78.7 kg)   LMP  (LMP Unknown)   BMI 29.76 kg/m   Eyes:  Sclera clear, no icterus.   Conjunctiva pink. PERRLA  Ears:  No scars, lesions or masses, Normal auditory acuity. Nose:  No deformity, discharge, or lesions. Mouth:  No deformity or lesions, oropharynx pink & moist.  Neck:  Supple; no masses or thyromegaly.  Respiratory: Normal respiratory effort, Normal percussion  Gastrointestinal:   Soft, non-tender and non-distended without masses, hepatosplenomegaly or hernias noted.  No guarding or rebound tenderness.     Rectal exam: No perianal lesions present.  Normal sphincter tone.  DRE with green stool in rectal  vault.  No blood or melena.  Cardiac: No clubbing or edema.  No cyanosis. Normal posterior tibial pedal pulses noted.  Lymphatic:  No significant cervical or axillary adenopathy.  Psych:  Alert and cooperative. Normal mood and affect.  Musculoskeletal:  Normal gait. Head normocephalic, atraumatic. Symmetrical without gross deformities. 5/5 Upper and Lower extremity strength bilaterally.  Skin: Warm. Intact without significant lesions or rashes. No jaundice.  Neurologic:  Face symmetrical, tongue midline, Normal sensation to touch;  grossly normal neurologically.  Psych:  Alert and oriented x3, Alert and cooperative. Normal mood and affect.   Labs: CBC    Component Value Date/Time   WBC 7.8 01/29/2021 1028   RBC 4.88 01/29/2021 1028   HGB 15.9 (H) 01/29/2021 1028   HGB 14.8 12/21/2017 1017   HCT 47.4 (H) 01/29/2021 1028   HCT 42.5 12/21/2017 1017   PLT 123 (L) 01/29/2021 1028   PLT 141 (L) 12/21/2017 1017   MCV 97.1 01/29/2021 1028   MCV 93 12/21/2017 1017   MCV 92 08/26/2012 2035   MCH 32.6 01/29/2021 1028   MCHC 33.5 01/29/2021 1028   RDW 12.5 01/29/2021 1028   RDW 12.8 12/21/2017 1017   RDW 13.1 08/26/2012 2035   LYMPHSABS 2,473 01/29/2021 1028   LYMPHSABS 2.5 12/21/2017 1017   EOSABS 109 01/29/2021 1028   EOSABS 0.3 12/21/2017 1017   BASOSABS 70 01/29/2021 1028   BASOSABS 0.0 12/21/2017 1017   CMP     Component Value Date/Time   NA 144 01/29/2021 1028   NA 145 (H) 12/21/2017 1017   NA 139 08/26/2012 2035   K 3.7 01/29/2021 1028   K 3.3 (L) 08/26/2012 2035   CL 109 01/29/2021 1028   CL 105 08/26/2012 2035   CO2 29 01/29/2021 1028   CO2 29 08/26/2012 2035   GLUCOSE 96 01/29/2021 1028   GLUCOSE 97 08/26/2012 2035   BUN 15 01/29/2021 1028   BUN 13 12/21/2017 1017   BUN 13 08/26/2012 2035   CREATININE 0.74 01/29/2021 1028   CALCIUM 10.1 01/29/2021 1028   CALCIUM 9.9 08/26/2012 2035   PROT 7.0 01/29/2021 1028   PROT 7.1 12/21/2017 1017   PROT 7.9  08/26/2012 2035   ALBUMIN 4.5 12/21/2017 1017   ALBUMIN 4.3 08/26/2012 2035   AST 16 01/29/2021 1028   AST 21 08/26/2012 2035   ALT 18 01/29/2021 1028   ALT 30 08/26/2012 2035   ALKPHOS 88 12/21/2017 1017   ALKPHOS 89 08/26/2012 2035   BILITOT 0.7 01/29/2021 1028   BILITOT 0.6 12/21/2017 1017   BILITOT 0.6 08/26/2012 2035   GFRNONAA 66 09/11/2020 1147   GFRAA 76 09/11/2020 1147    Imaging Studies:   Assessment and Plan:   [name] is a 80 y.o. y/o female has been referred for diarrhea, fecal incontinence  Patient symptoms have been going on for 6 months.  PCP notes reviewed and notes dating back to March 2022 report diarrhea at that time as well.  Patient has not had any infectious work-up, but would not expect infectious etiology for 6 months  Given that rectal tone was not particularly decreased on gross exam, and that she does not have any urinary incontinence, this may not be just from pelvic floor muscle weakness.  Patient does describe having 4 vaginal deliveries in the past that were uncomplicated  I also discussed with the patient that stool in the rectal vault today was green to inquire if she may be noticing a dark green-colored school at home instead of black.  However, she and her husband both clearly state that the stool is black and again pointed to her pants that are black in color and state that it is as black as that.  She does take baby aspirin every other day and Advil as needed although very sparingly Advil use.  She has never had a colonoscopy and question if she may have  a right-sided colon lesion that is causing reportedly black stools  Colonoscopy would also allow [place] to evaluate for any other underlying causes of her diarrhea and incontinence, such as microscopic colitis, and rule out IBD as well  I did discuss stepwise approach by proceeding with CT scan first, but the limitations of that is not being able to perform biopsies and specifically not  being able to rule out any lesions that could be causing her melena.  She does not have any particular signs of upper GI bleed such as hematemesis, hemodynamic instability or drop in hemoglobin that would be expected from an upper GI bleed.  Therefore, we will hold off on EGD at this time  Labs otherwise reassuring with normal hemoglobin at 15.9 in May Liver enzymes were normal  Of note, TSH was elevated in March 2022 at 26, which can also lead to altered bowel habits, but was normal on most recent check in May 2022 at 0.41 with normal T4 but her symptoms have continued despite this  I have discussed alternative options, risks & benefits,  which include, but are not limited to, bleeding, infection, perforation,respiratory complication & drug reaction.  The patient agrees with this plan & written consent will be obtained.    Alternative options of conservative management were discussed in detail, including but not limited to medication management, foregoing endoscopic procedures at this time and others.    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  I have asked PCP if this has been worked up before or if we need to refer to hematology for the same.  Patient denies any previous history of liver disease or cirrhosis or chronic alcohol use.   Dr [name]  Speech recognition software was used to dictate the above note.

## 2021-03-22 ENCOUNTER — Encounter: Payer: Self-pay | Admitting: Gastroenterology

## 2021-03-22 NOTE — Addendum Note (Signed)
Addended by: Lurlean Nanny on: 03/22/2021 11:25 AM   Modules accepted: Orders

## 2021-03-22 NOTE — Addendum Note (Signed)
Addended by: Lurlean Nanny on: 03/22/2021 09:33 AM   Modules accepted: Orders, SmartSet

## 2021-03-26 ENCOUNTER — Other Ambulatory Visit: Payer: Self-pay

## 2021-03-26 DIAGNOSIS — I10 Essential (primary) hypertension: Secondary | ICD-10-CM

## 2021-03-26 MED ORDER — LOSARTAN POTASSIUM 50 MG PO TABS: 50.0000 mg | ORAL_TABLET | Freq: Two times a day (BID) | ORAL | 3 refills | Status: DC

## 2021-03-26 NOTE — Progress Notes (Signed)
noted 

## 2021-03-26 NOTE — Progress Notes (Signed)
Called patient to advise her to stop taking amlodipine to see if it helps with the swelling, also to increase losartan to 2 tablets (100 mg) daily and follow up with Rollene Fare in 2 weeks. Patient verbalized understanding. She will call back and schedule follow up after colonoscopy.

## 2021-03-29 ENCOUNTER — Telehealth: Payer: Self-pay | Admitting: Gastroenterology

## 2021-03-29 NOTE — Telephone Encounter (Signed)
Patient lvm not understanding directions for prepping.

## 2021-03-29 NOTE — Telephone Encounter (Signed)
I spoke to pt as she had a question in reference to whether the pharmacy will still have the Rx when it is time for the procedure... I advised pt she will just need to ask the pharmacy to fill when she is ready as they will hold Rx for 1 yr

## 2021-04-10 ENCOUNTER — Other Ambulatory Visit: Payer: Self-pay | Admitting: Family Medicine

## 2021-04-10 DIAGNOSIS — I10 Essential (primary) hypertension: Secondary | ICD-10-CM

## 2021-04-11 ENCOUNTER — Other Ambulatory Visit: Payer: Self-pay

## 2021-04-11 DIAGNOSIS — G2581 Restless legs syndrome: Secondary | ICD-10-CM

## 2021-04-11 MED ORDER — ROPINIROLE HCL 0.5 MG PO TABS: 0.5000 mg | ORAL_TABLET | Freq: Every day | ORAL | 1 refills | Status: DC

## 2021-04-17 ENCOUNTER — Encounter: Payer: Self-pay | Admitting: Internal Medicine

## 2021-04-17 ENCOUNTER — Other Ambulatory Visit: Payer: Self-pay

## 2021-04-17 ENCOUNTER — Ambulatory Visit (INDEPENDENT_AMBULATORY_CARE_PROVIDER_SITE_OTHER): Payer: PPO | Admitting: Internal Medicine

## 2021-04-17 DIAGNOSIS — I10 Essential (primary) hypertension: Secondary | ICD-10-CM

## 2021-04-17 DIAGNOSIS — Z6830 Body mass index (BMI) 30.0-30.9, adult: Secondary | ICD-10-CM | POA: Diagnosis not present

## 2021-04-17 DIAGNOSIS — E6609 Other obesity due to excess calories: Secondary | ICD-10-CM | POA: Diagnosis not present

## 2021-04-17 DIAGNOSIS — G2581 Restless legs syndrome: Secondary | ICD-10-CM | POA: Diagnosis not present

## 2021-04-17 MED ORDER — ROPINIROLE HCL 0.5 MG PO TABS: 0.5000 mg | ORAL_TABLET | Freq: Every day | ORAL | 1 refills | Status: DC

## 2021-04-17 MED ORDER — LOSARTAN POTASSIUM 100 MG PO TABS
100.0000 mg | ORAL_TABLET | Freq: Every day | ORAL | 1 refills | Status: DC
Start: 2021-04-17 — End: 2021-11-05

## 2021-04-17 NOTE — Patient Instructions (Signed)
https://www.nhlbi.nih.gov/files/docs/public/heart/dash_brief.pdf">  DASH Eating Plan DASH stands for Dietary Approaches to Stop Hypertension. The DASH eating plan is a healthy eating plan that has been shown to: Reduce high blood pressure (hypertension). Reduce your risk for type 2 diabetes, heart disease, and stroke. Help with weight loss. What are tips for following this plan? Reading food labels Check food labels for the amount of salt (sodium) per serving. Choose foods with less than 5 percent of the Daily Value of sodium. Generally, foods with less than 300 milligrams (mg) of sodium per serving fit into this eating plan. To find whole grains, look for the word "whole" as the first word in the ingredient list. Shopping Buy products labeled as "low-sodium" or "no salt added." Buy fresh foods. Avoid canned foods and pre-made or frozen meals. Cooking Avoid adding salt when cooking. Use salt-free seasonings or herbs instead of table salt or sea salt. Check with your health care provider or pharmacist before using salt substitutes. Do not fry foods. Cook foods using healthy methods such as baking, boiling, grilling, roasting, and broiling instead. Cook with heart-healthy oils, such as olive, canola, avocado, soybean, or sunflower oil. Meal planning  Eat a balanced diet that includes: 4 or more servings of fruits and 4 or more servings of vegetables each day. Try to fill one-half of your plate with fruits and vegetables. 6-8 servings of whole grains each day. Less than 6 oz (170 g) of lean meat, poultry, or fish each day. A 3-oz (85-g) serving of meat is about the same size as a deck of cards. One egg equals 1 oz (28 g). 2-3 servings of low-fat dairy each day. One serving is 1 cup (237 mL). 1 serving of nuts, seeds, or beans 5 times each week. 2-3 servings of heart-healthy fats. Healthy fats called omega-3 fatty acids are found in foods such as walnuts, flaxseeds, fortified milks, and eggs.  These fats are also found in cold-water fish, such as sardines, salmon, and mackerel. Limit how much you eat of: Canned or prepackaged foods. Food that is high in trans fat, such as some fried foods. Food that is high in saturated fat, such as fatty meat. Desserts and other sweets, sugary drinks, and other foods with added sugar. Full-fat dairy products. Do not salt foods before eating. Do not eat more than 4 egg yolks a week. Try to eat at least 2 vegetarian meals a week. Eat more home-cooked food and less restaurant, buffet, and fast food.  Lifestyle When eating at a restaurant, ask that your food be prepared with less salt or no salt, if possible. If you drink alcohol: Limit how much you use to: 0-1 drink a day for women who are not pregnant. 0-2 drinks a day for men. Be aware of how much alcohol is in your drink. In the [place] one drink equals one 12 oz bottle of beer (355 mL), one 5 oz glass of wine (148 mL), or one 1 oz glass of hard liquor (44 mL). General information Avoid eating more than 2,300 mg of salt a day. If you have hypertension, you may need to reduce your sodium intake to 1,500 mg a day. Work with your health care provider to maintain a healthy body weight or to lose weight. Ask what an ideal weight is for you. Get at least 30 minutes of exercise that causes your heart to beat faster (aerobic exercise) most days of the week. Activities may include walking, swimming, or biking. Work with your health care provider   or dietitian to adjust your eating plan to your individual calorie needs. What foods should I eat? Fruits All fresh, dried, or frozen fruit. Canned fruit in natural juice (without addedsugar). Vegetables Fresh or frozen vegetables (raw, steamed, roasted, or grilled). Low-sodium or reduced-sodium tomato and vegetable juice. Low-sodium or reduced-sodium tomatosauce and tomato paste. Low-sodium or reduced-sodium canned vegetables. Grains Whole-grain or  whole-wheat bread. Whole-grain or whole-wheat pasta. Brown rice. Oatmeal. Quinoa. Bulgur. Whole-grain and low-sodium cereals. Pita bread.Low-fat, low-sodium crackers. Whole-wheat flour tortillas. Meats and other proteins Skinless chicken or turkey. Ground chicken or turkey. Pork with fat trimmed off. Fish and seafood. Egg whites. Dried beans, peas, or lentils. Unsalted nuts, nut butters, and seeds. Unsalted canned beans. Lean cuts of beef with fat trimmed off. Low-sodium, lean precooked or cured meat, such as sausages or meatloaves. Dairy Low-fat (1%) or fat-free (skim) milk. Reduced-fat, low-fat, or fat-free cheeses. Nonfat, low-sodium ricotta or cottage cheese. Low-fat or nonfatyogurt. Low-fat, low-sodium cheese. Fats and oils Soft margarine without trans fats. Vegetable oil. Reduced-fat, low-fat, or light mayonnaise and salad dressings (reduced-sodium). Canola, safflower, olive, avocado, soybean, andsunflower oils. Avocado. Seasonings and condiments Herbs. Spices. Seasoning mixes without salt. Other foods Unsalted popcorn and pretzels. Fat-free sweets. The items listed above may not be a complete list of foods and beverages you can eat. Contact a dietitian for more information. What foods should I avoid? Fruits Canned fruit in a light or heavy syrup. Fried fruit. Fruit in cream or buttersauce. Vegetables Creamed or fried vegetables. Vegetables in a cheese sauce. Regular canned vegetables (not low-sodium or reduced-sodium). Regular canned tomato sauce and paste (not low-sodium or reduced-sodium). Regular tomato and vegetable juice(not low-sodium or reduced-sodium). Pickles. Olives. Grains Baked goods made with fat, such as croissants, muffins, or some breads. Drypasta or rice meal packs. Meats and other proteins Fatty cuts of meat. Ribs. Fried meat. Bacon. Bologna, salami, and other precooked or cured meats, such as sausages or meat loaves. Fat from the back of a pig (fatback). Bratwurst.  Salted nuts and seeds. Canned beans with added salt. Canned orsmoked fish. Whole eggs or egg yolks. Chicken or turkey with skin. Dairy Whole or 2% milk, cream, and half-and-half. Whole or full-fat cream cheese. Whole-fat or sweetened yogurt. Full-fat cheese. Nondairy creamers. Whippedtoppings. Processed cheese and cheese spreads. Fats and oils Butter. Stick margarine. Lard. Shortening. Ghee. Bacon fat. Tropical oils, suchas coconut, palm kernel, or palm oil. Seasonings and condiments Onion salt, garlic salt, seasoned salt, table salt, and sea salt. Worcestershire sauce. Tartar sauce. Barbecue sauce. Teriyaki sauce. Soy sauce, including reduced-sodium. Steak sauce. Canned and packaged gravies. Fish sauce. Oyster sauce. Cocktail sauce. Store-bought horseradish. Ketchup. Mustard. Meat flavorings and tenderizers. Bouillon cubes. Hot sauces. Pre-made or packaged marinades. Pre-made or packaged taco seasonings. Relishes. Regular saladdressings. Other foods Salted popcorn and pretzels. The items listed above may not be a complete list of foods and beverages you should avoid. Contact a dietitian for more information. Where to find more information National Heart, Lung, and Blood Institute: www.nhlbi.nih.gov American Heart Association: www.heart.org Academy of Nutrition and Dietetics: www.eatright.org National Kidney Foundation: www.kidney.org Summary The DASH eating plan is a healthy eating plan that has been shown to reduce high blood pressure (hypertension). It may also reduce your risk for type 2 diabetes, heart disease, and stroke. When on the DASH eating plan, aim to eat more fresh fruits and vegetables, whole grains, lean proteins, low-fat dairy, and heart-healthy fats. With the DASH eating plan, you should limit salt (sodium) intake to 2,300   mg a day. If you have hypertension, you may need to reduce your sodium intake to 1,500 mg a day. Work with your health care provider or dietitian to adjust  your eating plan to your individual calorie needs. This information is not intended to replace advice given to you by your health care provider. Make sure you discuss any questions you have with your healthcare provider. Document Revised: 08/12/2019 Document Reviewed: 08/12/2019 Elsevier Patient Education  2022 Elsevier Inc.  

## 2021-04-17 NOTE — Assessment & Plan Note (Signed)
Controlled on Losartan and Metoprolol Losartan refilled today Reinforced DASH diet and exercise for weight

## 2021-04-17 NOTE — Assessment & Plan Note (Signed)
Reinforced DASH diet and exercise weight loss

## 2021-04-17 NOTE — Progress Notes (Signed)
Subjective:    Patient ID: [name], female    DOB: 1941-05-02, 80 y.o.   MRN: [id number]  HPI  Patient presents to clinic today for follow-up hypertension and peripheral edema.  Her BNP was negative.  We changed her Amlodipine to Losartan and she continued her Metoprolol.  She has been taking the medications as prescribed and has noticed slight decrease in her swelling.  Her BP today is 147/69.  ECG from 05/2019 reviewed.  She also needs a refill of her Ropinirole which she takes for restless legs.  Review of Systems     Past Medical History:  Diagnosis Date   Allergy    Anemia    Anxiety    Cancer ([place])    skin cancer   Hyperlipidemia    Hypertension    Hypothyroidism    Sciatica    Sleep apnea    does not use cpap   Thyroid disease     Current Outpatient Medications  Medication Sig Dispense Refill   acetaminophen (TYLENOL) 500 MG tablet Take 500 mg by mouth every 6 (six) hours as needed for moderate pain.      amLODipine (NORVASC) 10 MG tablet Take 1 tablet daily 90 tablet 1   citalopram (CELEXA) 20 MG tablet TAKE 1 TABLET(20 MG) BY MOUTH AT BEDTIME (Patient taking differently: Take 10 mg by mouth at bedtime. TAKE 1 TABLET(20 MG) BY MOUTH AT BEDTIME) 90 tablet 1   levothyroxine (SYNTHROID) 150 MCG tablet Take 1 tablet (150 mcg total) by mouth daily. 90 tablet 0   losartan (COZAAR) 50 MG tablet Take 1 tablet (50 mg total) by mouth 2 (two) times daily. TAKE 2 TABLET(100 MG) BY MOUTH DAILY. 90 tablet 3   metoprolol succinate (TOPROL-XL) 50 MG 24 hr tablet TAKE 1 TABLET(50 MG) BY MOUTH EVERY MORNING 90 tablet 0   rOPINIRole (REQUIP) 0.5 MG tablet Take 1 tablet (0.5 mg total) by mouth at bedtime. 90 tablet 1   simvastatin (ZOCOR) 40 MG tablet Take 40 mg by mouth daily.     traZODone (DESYREL) 50 MG tablet TAKE 1 TABLET(50 MG) BY MOUTH AT BEDTIME 90 tablet 1   No current facility-administered medications for this visit.    Allergies  Allergen Reactions   Codeine  Nausea Only    Tolerates oxycodone   Morphine Nausea And Vomiting   Ace Inhibitors Cough   Adhesive [Tape] Other (See Comments) and Rash    whelps    Family History  Problem Relation Age of Onset   Breast cancer Paternal Aunt    Dementia Mother    Dementia Sister    Heart attack Brother    Diabetes Brother    Heart attack Son    Hypercalcemia Neg Hx     Social History   Socioeconomic History   Marital status: Married    Spouse name: Not on file   Number of children: Not on file   Years of education: Not on file   Highest education level: Not on file  Occupational History   Occupation: retired  Tobacco Use   Smoking status: Every Day    Packs/day: 0.50    Years: 40.00    Pack years: 20.00    Types: Cigarettes   Smokeless tobacco: Never  Vaping Use   Vaping Use: Never used  Substance and Sexual Activity   Alcohol use: No   Drug use: No   Sexual activity: Not Currently  Other Topics Concern   Not on file  Social History Narrative   Not on file   Social Determinants of Health   Financial Resource Strain: Low Risk    Difficulty of Paying Living Expenses: Not hard at all  Food Insecurity: No Food Insecurity   Worried About Charity fundraiser in the Last Year: Never true   Arboriculturist in the Last Year: Never true  Transportation Needs: No Transportation Needs   Lack of Transportation (Medical): No   Lack of Transportation (Non-Medical): No  Physical Activity: Inactive   Days of Exercise per Week: 0 days   Minutes of Exercise per Session: 0 min  Stress: No Stress Concern Present   Feeling of Stress : Not at all  Social Connections: Not on file  Intimate Partner Violence: Not on file     Constitutional: Denies fever, malaise, fatigue, headache or abrupt weight changes.  Respiratory: Denies difficulty breathing, shortness of breath, cough or sputum production.   Cardiovascular: Denies chest pain, chest tightness, palpitations or swelling in the hands or  feet.  Musculoskeletal: Denies decrease in range of motion, difficulty with gait, muscle pain or joint pain and swelling.  Skin: Denies redness, rashes, lesions or ulcercations.  Neurological: Patient reports restless legs.  Denies dizziness, difficulty with memory, difficulty with speech or problems with balance and coordination.    No other specific complaints in a complete review of systems (except as listed in HPI above).  Objective:   Physical Exam  BP (!) 147/69 (BP Location: Right Arm, Patient Position: Sitting, Cuff Size: Normal)   Pulse 67   Temp 97.7 F (36.5 C) (Temporal)   Resp 18   Ht '5\' 4"'$  (1.626 m)   Wt 171 lb 12.8 oz (77.9 kg)   LMP  (LMP Unknown)   SpO2 98%   BMI 29.49 kg/m   Wt Readings from Last 3 Encounters:  03/21/21 173 lb 6.4 oz (78.7 kg)  03/18/21 175 lb (79.4 kg)  02/04/21 171 lb (77.6 kg)    General: Appears her stated age, overweight in NAD. Skin: Warm, dry and intact. No rashes noted. HEENT: Head: normal shape and size; Eyes: sclera white and EOMs intact; Cardiovascular: Normal rate and rhythm. S1,S2 noted.  No murmur, rubs or gallops noted. No JVD or BLE edema.  Pulmonary/Chest: Normal effort and positive vesicular breath sounds. No respiratory distress. No wheezes, rales or ronchi noted.  Musculoskeletal: No difficulty with gait.  Neurological: Alert and oriented.   BMET    Component Value Date/Time   NA 144 01/29/2021 1028   NA 145 (H) 12/21/2017 1017   NA 139 08/26/2012 2035   K 3.7 01/29/2021 1028   K 3.3 (L) 08/26/2012 2035   CL 109 01/29/2021 1028   CL 105 08/26/2012 2035   CO2 29 01/29/2021 1028   CO2 29 08/26/2012 2035   GLUCOSE 96 01/29/2021 1028   GLUCOSE 97 08/26/2012 2035   BUN 15 01/29/2021 1028   BUN 13 12/21/2017 1017   BUN 13 08/26/2012 2035   CREATININE 0.74 01/29/2021 1028   CALCIUM 10.1 01/29/2021 1028   CALCIUM 9.9 08/26/2012 2035   GFRNONAA 66 09/11/2020 1147   GFRAA 76 09/11/2020 1147    Lipid Panel      Component Value Date/Time   CHOL 207 (H) 11/01/2018 0925   CHOL 181 12/21/2017 1017   TRIG 124 11/01/2018 0925   HDL 52 11/01/2018 0925   HDL 50 12/21/2017 1017   CHOLHDL 4.0 11/01/2018 0925   LDLCALC 132 (H) 11/01/2018 [id number]  CBC    Component Value Date/Time   WBC 7.8 01/29/2021 1028   RBC 4.88 01/29/2021 1028   HGB 15.9 (H) 01/29/2021 1028   HGB 14.8 12/21/2017 1017   HCT 47.4 (H) 01/29/2021 1028   HCT 42.5 12/21/2017 1017   PLT 123 (L) 01/29/2021 1028   PLT 141 (L) 12/21/2017 1017   MCV 97.1 01/29/2021 1028   MCV 93 12/21/2017 1017   MCV 92 08/26/2012 2035   MCH 32.6 01/29/2021 1028   MCHC 33.5 01/29/2021 1028   RDW 12.5 01/29/2021 1028   RDW 12.8 12/21/2017 1017   RDW 13.1 08/26/2012 2035   LYMPHSABS 2,473 01/29/2021 1028   LYMPHSABS 2.5 12/21/2017 1017   EOSABS 109 01/29/2021 1028   EOSABS 0.3 12/21/2017 1017   BASOSABS 70 01/29/2021 1028   BASOSABS 0.0 12/21/2017 1017    Hgb A1C No results found for: HGBA1C         Assessment & Plan:     [name], NP This visit occurred during the SARS-CoV-2 public health emergency.  Safety protocols were in place, including screening questions prior to the visit, additional usage of staff PPE, and extensive cleaning of exam room while observing appropriate contact time as indicated for disinfecting solutions.

## 2021-04-17 NOTE — Assessment & Plan Note (Signed)
Ropinirole refilled today

## 2021-04-23 ENCOUNTER — Encounter: Payer: Self-pay | Admitting: Gastroenterology

## 2021-04-23 ENCOUNTER — Ambulatory Visit
Admission: RE | Admit: 2021-04-23 | Discharge: 2021-04-23 | Disposition: A | Discharge status: Home / Self Care | Payer: PPO | Attending: Gastroenterology | Admitting: Gastroenterology

## 2021-04-23 ENCOUNTER — Ambulatory Visit: Payer: PPO | Admitting: Certified Registered Nurse Anesthetist

## 2021-04-23 ENCOUNTER — Encounter
Admission: RE | Disposition: A | Discharge status: Home / Self Care | Payer: Self-pay | Source: Home / Self Care | Attending: Gastroenterology

## 2021-04-23 DIAGNOSIS — Z79899 Other long term (current) drug therapy: Secondary | ICD-10-CM | POA: Diagnosis not present

## 2021-04-23 DIAGNOSIS — K639 Disease of intestine, unspecified: Secondary | ICD-10-CM

## 2021-04-23 DIAGNOSIS — R197 Diarrhea, unspecified: Secondary | ICD-10-CM

## 2021-04-23 DIAGNOSIS — Z888 Allergy status to other drugs, medicaments and biological substances status: Secondary | ICD-10-CM | POA: Diagnosis not present

## 2021-04-23 DIAGNOSIS — D126 Benign neoplasm of colon, unspecified: Secondary | ICD-10-CM | POA: Diagnosis not present

## 2021-04-23 DIAGNOSIS — Z885 Allergy status to narcotic agent status: Secondary | ICD-10-CM | POA: Insufficient documentation

## 2021-04-23 DIAGNOSIS — K635 Polyp of colon: Secondary | ICD-10-CM | POA: Diagnosis not present

## 2021-04-23 DIAGNOSIS — Z8249 Family history of ischemic heart disease and other diseases of the circulatory system: Secondary | ICD-10-CM | POA: Insufficient documentation

## 2021-04-23 DIAGNOSIS — Z91048 Other nonmedicinal substance allergy status: Secondary | ICD-10-CM | POA: Diagnosis not present

## 2021-04-23 DIAGNOSIS — Z833 Family history of diabetes mellitus: Secondary | ICD-10-CM | POA: Insufficient documentation

## 2021-04-23 DIAGNOSIS — K648 Other hemorrhoids: Secondary | ICD-10-CM | POA: Insufficient documentation

## 2021-04-23 DIAGNOSIS — K6389 Other specified diseases of intestine: Secondary | ICD-10-CM | POA: Diagnosis not present

## 2021-04-23 DIAGNOSIS — F17210 Nicotine dependence, cigarettes, uncomplicated: Secondary | ICD-10-CM | POA: Diagnosis not present

## 2021-04-23 DIAGNOSIS — D122 Benign neoplasm of ascending colon: Secondary | ICD-10-CM | POA: Diagnosis not present

## 2021-04-23 DIAGNOSIS — Z803 Family history of malignant neoplasm of breast: Secondary | ICD-10-CM | POA: Insufficient documentation

## 2021-04-23 DIAGNOSIS — F419 Anxiety disorder, unspecified: Secondary | ICD-10-CM | POA: Diagnosis not present

## 2021-04-23 HISTORY — PX: COLONOSCOPY WITH PROPOFOL: SHX5780

## 2021-04-23 SURGERY — COLONOSCOPY WITH PROPOFOL
Anesthesia: General

## 2021-04-23 MED ORDER — PROPOFOL 500 MG/50ML IV EMUL
INTRAVENOUS | Status: DC | PRN
  Administered 2021-04-23: 150 ug/kg/min via INTRAVENOUS

## 2021-04-23 MED ORDER — PROPOFOL 10 MG/ML IV BOLUS
INTRAVENOUS | Status: DC | PRN
  Administered 2021-04-23: 50 mg via INTRAVENOUS
  Administered 2021-04-23: 20 mg via INTRAVENOUS

## 2021-04-23 MED ORDER — LIDOCAINE HCL (PF) 2 % IJ SOLN
INTRAMUSCULAR | Status: AC
  Filled 2021-04-23: qty 5

## 2021-04-23 MED ORDER — PHENYLEPHRINE HCL (PRESSORS) 10 MG/ML IV SOLN
INTRAVENOUS | Status: AC
  Filled 2021-04-23: qty 1

## 2021-04-23 MED ORDER — LIDOCAINE HCL (CARDIAC) PF 100 MG/5ML IV SOSY
PREFILLED_SYRINGE | INTRAVENOUS | Status: DC | PRN
  Administered 2021-04-23: 50 mg via INTRAVENOUS

## 2021-04-23 MED ORDER — SODIUM CHLORIDE 0.9 % IV SOLN: INTRAVENOUS | Status: DC

## 2021-04-23 MED ORDER — PROPOFOL 500 MG/50ML IV EMUL
INTRAVENOUS | Status: AC
  Filled 2021-04-23: qty 50

## 2021-04-23 NOTE — Transfer of Care (Signed)
Immediate Anesthesia Transfer of Care Note  Patient: [name]  Procedure(s) Performed: COLONOSCOPY WITH PROPOFOL  Patient Location: Endoscopy Unit  Anesthesia Type:General  Level of Consciousness: drowsy  Airway & Oxygen Therapy: Patient Spontanous Breathing  Post-op Assessment: Report given to RN and Post -op Vital signs reviewed and stable  Post vital signs: Reviewed and stable  Last Vitals:  Vitals Value Taken Time  BP 112/47 04/23/21 0847  Temp    Pulse 69 04/23/21 0848  Resp 32 04/23/21 0848  SpO2 93 % 04/23/21 0848  Vitals shown include unvalidated device data.  Last Pain:  Vitals:   04/23/21 0748  TempSrc: Skin  PainSc: 0-No pain         Complications: No notable events documented.

## 2021-04-23 NOTE — Op Note (Signed)
[facility] Gastroenterology Patient Name: [name] Procedure Date: 04/23/2021 8:07 AM MRN: [id number] Account #: [id number] Date of Birth: December 11, 1940 Admit Type: Outpatient Age: 80 Room: [facility] ENDO ROOM 3 Gender: Female Note Status: Finalized Procedure:             Colonoscopy Indications:           Diarrhea Providers:             Agustina Witzke B. Bonna Gains MD, MD Medicines:             Monitored Anesthesia Care Complications:         No immediate complications. Procedure:             Pre-Anesthesia Assessment:                        - ASA Grade Assessment: II - A patient with mild                         systemic disease.                        - Prior to the procedure, a History and Physical was                         performed, and patient medications, allergies and                         sensitivities were reviewed. The patient's tolerance                         of previous anesthesia was reviewed.                        - The risks and benefits of the procedure and the                         sedation options and risks were discussed with the                         patient. All questions were answered and informed                         consent was obtained.                        - Patient identification and proposed procedure were                         verified prior to the procedure by the physician, the                         nurse, the anesthesiologist, the anesthetist and the                         technician. The procedure was verified in the                         procedure room.  After obtaining informed consent, the colonoscope was                         passed under direct vision. Throughout the procedure,                         the patient's blood pressure, pulse, and oxygen                         saturations were monitored continuously. The                         Colonoscope was introduced through the anus and                          advanced to the the terminal ileum. The colonoscopy                         was performed with ease. The patient tolerated the                         procedure well. The quality of the bowel preparation                         was fair. Findings:      The perianal and digital rectal examinations were normal.      A 10 mm polyp was found in the ascending colon. The polyp was sessile.       The polyp was removed with a cold snare. Resection and retrieval were       complete. For hemostasis, one hemostatic clip was successfully placed.       There was no bleeding at the end of the procedure. Mild oozing of blood       was seen from the bottom margins of the polypectomy site and thus one       clip was placed which stopped the bleeding completely.      A 4 mm polyp was found in the ascending colon. The polyp was flat.       Polypectomy was attempted, initially using a cold snare. Polyp resection       was incomplete with this device. This intervention then required a       different device and polypectomy technique. The polyp was removed with a       cold biopsy forceps. Resection and retrieval were complete.      A patchy area of mildly erythematous mucosa was found in the sigmoid       colon. Biopsies were taken with a cold forceps for histology.      The exam was otherwise without abnormality.      The rectum, sigmoid colon, descending colon, transverse colon, ascending       colon, cecum and ileum appeared normal. Biopsies for histology were       taken with a cold forceps from the cecum, ascending colon, transverse       colon and descending colon for evaluation of microscopic colitis.      Non-bleeding internal hemorrhoids were found during endoscopy.      Retroflexion in the rectum was not performed due to Narrow rectum.       Careful frontal view of the rectum was  otherwise normal. Impression:            - Preparation of the colon was fair.                         - One 10 mm polyp in the ascending colon, removed with                         a cold snare. Resected and retrieved.                        - One 4 mm polyp in the ascending colon, removed with                         a cold biopsy forceps. Resected and retrieved.                        - Erythematous mucosa in the sigmoid colon. Biopsied.                        - The examination was otherwise normal.                        - The rectum, sigmoid colon, descending colon,                         transverse colon, ascending colon, cecum and terminal                         ileum are normal. Biopsied.                        - Non-bleeding internal hemorrhoids. Recommendation:        - Discharge patient to home (with escort).                        - Advance diet as tolerated.                        - Continue present medications.                        - Await pathology results.                        - Repeat colonoscopy date to be determined after                         pending pathology results are reviewed.                        - The findings and recommendations were discussed with                         the patient.                        - The findings and recommendations were discussed with                         the patient's family.                        -  Return to primary care physician as previously                         scheduled.                        - High fiber diet. Procedure Code(s):     --- Professional ---                        [id number], Colonoscopy, flexible; with removal of                         tumor(s), polyp(s), or other lesion(s) by snare                         technique                        [id number], 52, Colonoscopy, flexible; with biopsy, single                         or multiple Diagnosis Code(s):     --- Professional ---                        K63.5, Polyp of colon                        K63.89, Other specified diseases of intestine                         R19.7, Diarrhea, unspecified CPT copyright 2019 American Medical Association. All rights reserved. The codes documented in this report are preliminary and upon coder review may  be revised to meet current compliance requirements.  Vonda Antigua, MD Margretta Sidle B. Bonna Gains MD, MD 04/23/2021 8:57:03 AM This report has been signed electronically. Number of Addenda: 0 Note Initiated On: 04/23/2021 8:07 AM Scope Withdrawal Time: 0 hours 20 minutes 33 seconds  Total Procedure Duration: 0 hours 28 minutes 41 seconds  Estimated Blood Loss:  Estimated blood loss: none.      [facility]

## 2021-04-23 NOTE — Anesthesia Preprocedure Evaluation (Signed)
Anesthesia Evaluation  Patient identified by MRN, date of birth, ID band Patient awake    Reviewed: Allergy & Precautions, H&P , NPO status , Patient's Chart, lab work & pertinent test results  History of Anesthesia Complications Negative for: history of anesthetic complications  Airway Mallampati: II  TM Distance: >3 FB Neck ROM: full    Dental  (+) Missing, Edentulous Lower, Edentulous Upper, Dental Advidsory Given   Pulmonary neg shortness of breath, sleep apnea and Continuous Positive Airway Pressure Ventilation , COPD, neg recent URI, Current Smoker,           Cardiovascular Exercise Tolerance: Good hypertension, (-) angina(-) Past MI, (-) Cardiac Stents and (-) DOE (-) dysrhythmias (-) Valvular Problems/Murmurs     Neuro/Psych neg Seizures PSYCHIATRIC DISORDERS Anxiety  Neuromuscular disease    GI/Hepatic negative GI ROS, Neg liver ROS, neg GERD  ,  Endo/Other  neg diabetesHypothyroidism   Renal/GU      Musculoskeletal  (+) Arthritis ,   Abdominal   Peds  Hematology negative hematology ROS (+)   Anesthesia Other Findings Past Medical History: No date: Allergy No date: Anemia No date: Anxiety No date: Cancer ([place])     Comment:  skin cancer No date: Hyperlipidemia No date: Hypertension No date: Hypothyroidism No date: Sciatica No date: Sleep apnea     Comment:  does not use cpap No date: Thyroid disease  Past Surgical History: No date: ABDOMINAL HYSTERECTOMY No date: APPENDECTOMY No date: BACK SURGERY     Comment:  lower No date: BILATERAL CARPAL TUNNEL RELEASE No date: BREAST CYST ASPIRATION; Left     Comment:  neg No date: COLONOSCOPY No date: JOINT REPLACEMENT No date: KNEE SURGERY; Bilateral No date: KNEE SURGERY 08/13/2015: TOTAL KNEE REVISION; Right     Comment:  Procedure: TOTAL KNEE REVISION;  Surgeon: Paralee Cancel,               MD;  Location: [facility];  Service: Orthopedics;                 Laterality: Right;  BMI    Body Mass Index: 31.66 kg/m      Reproductive/Obstetrics negative OB ROS                             Anesthesia Physical  Anesthesia Plan  ASA: 3  Anesthesia Plan: General   Post-op Pain Management:    Induction: Intravenous  PONV Risk Score and Plan: Treatment may vary due to age or medical condition, TIVA and Propofol infusion  Airway Management Planned: Natural Airway and Nasal Cannula  Additional Equipment:   Intra-op Plan:   Post-operative Plan:   Informed Consent: I have reviewed the patients History and Physical, chart, labs and discussed the procedure including the risks, benefits and alternatives for the proposed anesthesia with the patient or authorized representative who has indicated his/her understanding and acceptance.     Dental Advisory Given  Plan Discussed with: Anesthesiologist, CRNA and Surgeon  Anesthesia Plan Comments: (Patient consented for risks of anesthesia including but not limited to:  - adverse reactions to medications - damage to teeth, lips or other oral mucosa - sore throat or hoarseness - Damage to heart, brain, lungs or loss of life  Patient voiced understanding.)        Anesthesia Quick Evaluation

## 2021-04-23 NOTE — H&P (Signed)
Vonda Antigua, MD [address], [place], [place], [place], [address] [address], [place], [place], [place], [address] Phone: [phone]  Fax: [phone]  Primary Care Physician:  [name], NP   Pre-Procedure History & Physical: HPI:  [name] is a 80 y.o. female is here for a colonoscopy.   Past Medical History:  Diagnosis Date   Allergy    Anemia    Anxiety    Cancer ([place])    skin cancer   Hyperlipidemia    Hypertension    Hypothyroidism    Sciatica    Sleep apnea    does not use cpap   Thyroid disease     Past Surgical History:  Procedure Laterality Date   ABDOMINAL HYSTERECTOMY     APPENDECTOMY     BACK SURGERY     lower   BILATERAL CARPAL TUNNEL RELEASE     BREAST CYST ASPIRATION Left    neg   COLONOSCOPY     ENTROPIAN REPAIR Left 06/08/2020   Procedure: ENTROPION REPAIR, SUTURES ENTROPION REPAIR, EXTENSIVE LEFT;  Surgeon: Karle Starch, MD;  Location: [facility];  Service: Ophthalmology;  Laterality: Left;   JOINT REPLACEMENT     KNEE SURGERY Bilateral    KNEE SURGERY     OPEN REDUCTION INTERNAL FIXATION (ORIF) DISTAL RADIAL FRACTURE Left 06/23/2019   Procedure: OPEN REDUCTION INTERNAL FIXATION (ORIF) DISTAL RADIAL FRACTURE;  Surgeon: Hessie Knows, MD;  Location: [facility];  Service: Orthopedics;  Laterality: Left;   PARATHYROIDECTOMY  04/27/2020   SPINAL CORD STIMULATOR IMPLANT Right    THYROIDECTOMY Right 04/27/2020   Procedure: PARATHYROIDECTOMY;  Surgeon: Johnathan Hausen, MD;  Location: [facility];  Service: General;  Laterality: Right;   TOTAL KNEE REVISION Right 08/13/2015   Procedure: TOTAL KNEE REVISION;  Surgeon: Paralee Cancel, MD;  Location: [facility];  Service: Orthopedics;  Laterality: Right;    Prior to Admission medications   Medication Sig Start Date End Date Taking? Authorizing Provider  citalopram (CELEXA) 20 MG tablet TAKE 1 TABLET(20 MG) BY MOUTH AT BEDTIME Patient taking differently: Take 10 mg by mouth at  bedtime. TAKE 1 TABLET(20 MG) BY MOUTH AT BEDTIME 10/04/20  Yes Malfi, Lupita Raider, FNP  levothyroxine (SYNTHROID) 150 MCG tablet Take 1 tablet (150 mcg total) by mouth daily. 03/18/21  Yes [name], NP  losartan (COZAAR) 100 MG tablet Take 1 tablet (100 mg total) by mouth daily. 04/17/21  Yes [name], NP  metoprolol succinate (TOPROL-XL) 50 MG 24 hr tablet TAKE 1 TABLET(50 MG) BY MOUTH EVERY MORNING 04/10/21  Yes Karamalegos, Devonne Doughty, DO  rOPINIRole (REQUIP) 0.5 MG tablet Take 1 tablet (0.5 mg total) by mouth at bedtime. 04/17/21  Yes [name], NP  simvastatin (ZOCOR) 40 MG tablet Take 40 mg by mouth daily.   Yes [provider]  acetaminophen (TYLENOL) 500 MG tablet Take 500 mg by mouth every 6 (six) hours as needed for moderate pain.     [provider]  traZODone (DESYREL) 50 MG tablet TAKE 1 TABLET(50 MG) BY MOUTH AT BEDTIME 12/04/20   Kathrine Haddock, NP    Allergies as of 03/22/2021 - Review Complete 03/21/2021  Allergen Reaction Noted   Codeine Nausea Only 02/22/2014   Morphine Nausea And Vomiting 06/12/2015   Ace inhibitors Cough 03/16/2018   Adhesive [tape] Other (See Comments) and Rash 02/22/2014    Family History  Problem Relation Age of Onset   Breast cancer Paternal Aunt  Dementia Mother    Dementia Sister    Heart attack Brother    Diabetes Brother    Heart attack Son    Hypercalcemia Neg Hx     Social History   Socioeconomic History   Marital status: Married    Spouse name: Not on file   Number of children: Not on file   Years of education: Not on file   Highest education level: Not on file  Occupational History   Occupation: retired  Tobacco Use   Smoking status: Every Day    Packs/day: 0.50    Years: 40.00    Pack years: 20.00    Types: Cigarettes   Smokeless tobacco: Never  Vaping Use   Vaping Use: Never used  Substance and Sexual Activity   Alcohol use: No   Drug use: No   Sexual activity: Not Currently  Other  Topics Concern   Not on file  Social History Narrative   Not on file   Social Determinants of Health   Financial Resource Strain: Low Risk    Difficulty of Paying Living Expenses: Not hard at all  Food Insecurity: No Food Insecurity   Worried About Charity fundraiser in the Last Year: Never true   [place] in the Last Year: Never true  Transportation Needs: No Transportation Needs   Lack of Transportation (Medical): No   Lack of Transportation (Non-Medical): No  Physical Activity: Inactive   Days of Exercise per Week: 0 days   Minutes of Exercise per Session: 0 min  Stress: No Stress Concern Present   Feeling of Stress : Not at all  Social Connections: Not on file  Intimate Partner Violence: Not on file    Review of Systems: See HPI, otherwise negative ROS  Physical Exam: Constitutional: General:   Alert,  Well-developed, well-nourished, pleasant and cooperative in NAD BP (!) 179/88   Pulse 63   Temp 97.6 F (36.4 C) (Skin)   Resp 18   Ht '5\' 3"'$  (1.6 m)   Wt 77.1 kg   LMP  (LMP Unknown)   SpO2 97%   BMI 30.11 kg/m   Head: Normocephalic, atraumatic.   Eyes:  Sclera clear, no icterus.   Conjunctiva pink.   Mouth:  No deformity or lesions, oropharynx pink & moist.  Neck:  Supple, trachea midline  Respiratory: Normal respiratory effort  Gastrointestinal:  Soft, non-tender and non-distended without masses, hepatosplenomegaly or hernias noted.  No guarding or rebound tenderness.     Cardiac: No clubbing or edema.  No cyanosis. Normal posterior tibial pedal pulses noted.  Lymphatic:  No significant cervical adenopathy.  Psych:  Alert and cooperative. Normal mood and affect.  Musculoskeletal:   Symmetrical without gross deformities. 5/5 Lower extremity strength bilaterally.  Skin: Warm. Intact without significant lesions or rashes. No jaundice.  Neurologic:  Face symmetrical, tongue midline, Normal sensation to touch;  grossly normal  neurologically.  Psych:  Alert and oriented x3, Alert and cooperative. Normal mood and affect.  Impression/Plan: [name] is here for a colonoscopy to be performed for diarrhea  Risks, benefits, limitations, and alternatives regarding  colonoscopy have been reviewed with the patient.  Questions have been answered.  All parties agreeable.   Virgel Manifold, MD  04/23/2021, 8:08 AM

## 2021-04-23 NOTE — Anesthesia Postprocedure Evaluation (Signed)
Anesthesia Post Note  Patient: [name]  Procedure(s) Performed: COLONOSCOPY WITH PROPOFOL  Patient location during evaluation: Endoscopy Anesthesia Type: General Level of consciousness: awake and alert Pain management: pain level controlled Vital Signs Assessment: post-procedure vital signs reviewed and stable Respiratory status: spontaneous breathing, nonlabored ventilation, respiratory function stable and patient connected to nasal cannula oxygen Cardiovascular status: blood pressure returned to baseline and stable Postop Assessment: no apparent nausea or vomiting Anesthetic complications: no   No notable events documented.   Last Vitals:  Vitals:   04/23/21 0907 04/23/21 0917  BP: (!) 133/57 (!) 144/61  Pulse: 63 68  Resp: 16 (!) 27  Temp:    SpO2: 97% 97%    Last Pain:  Vitals:   04/23/21 0917  TempSrc:   PainSc: 0-No pain                 Martha Clan

## 2021-04-24 ENCOUNTER — Encounter: Payer: Self-pay | Admitting: Gastroenterology

## 2021-04-25 LAB — SURGICAL PATHOLOGY

## 2021-05-02 ENCOUNTER — Encounter: Payer: Self-pay | Admitting: Gastroenterology

## 2021-05-02 ENCOUNTER — Other Ambulatory Visit: Payer: Self-pay

## 2021-05-02 DIAGNOSIS — G2581 Restless legs syndrome: Secondary | ICD-10-CM

## 2021-05-02 MED ORDER — ROPINIROLE HCL 0.5 MG PO TABS: 0.5000 mg | ORAL_TABLET | Freq: Every day | ORAL | 1 refills | Status: DC

## 2021-05-08 ENCOUNTER — Encounter: Payer: Self-pay | Admitting: Internal Medicine

## 2021-05-08 ENCOUNTER — Ambulatory Visit (INDEPENDENT_AMBULATORY_CARE_PROVIDER_SITE_OTHER): Payer: PPO | Admitting: Internal Medicine

## 2021-05-08 ENCOUNTER — Other Ambulatory Visit: Payer: Self-pay

## 2021-05-08 VITALS — BP 156/80 | HR 63 | Temp 97.8°F | Resp 17 | Ht 63.0 in | Wt 167.8 lb

## 2021-05-08 DIAGNOSIS — I10 Essential (primary) hypertension: Secondary | ICD-10-CM | POA: Diagnosis not present

## 2021-05-08 DIAGNOSIS — F419 Anxiety disorder, unspecified: Secondary | ICD-10-CM | POA: Diagnosis not present

## 2021-05-08 DIAGNOSIS — R450 Nervousness: Secondary | ICD-10-CM | POA: Diagnosis not present

## 2021-05-08 DIAGNOSIS — I700 Atherosclerosis of aorta: Secondary | ICD-10-CM | POA: Insufficient documentation

## 2021-05-08 LAB — POCT GLYCOSYLATED HEMOGLOBIN (HGB A1C): Hemoglobin A1C: 5.3 % (ref 4.0–5.6)

## 2021-05-08 MED ORDER — CITALOPRAM HYDROBROMIDE 20 MG PO TABS: 20.0000 mg | ORAL_TABLET | Freq: Every day | ORAL | 1 refills | Status: DC

## 2021-05-08 NOTE — Patient Instructions (Signed)

## 2021-05-08 NOTE — Progress Notes (Signed)
Subjective:    Patient ID: [name], female    DOB: 1940-10-18, 80 y.o.   MRN: [id number]  HPI  Patient presents to the clinic today with complaint of feeling anxious, jittery and shaky.  She reports this started 2 weeks ago after having her colonoscopy.  She is managed on Citalopram for anxiety. She reports she sleeps with the use of Trazadone. She is not currently seeing a therapist.  She denies depression, SI/HI.  She has no history of diabetes.  Review of Systems     Past Medical History:  Diagnosis Date   Allergy    Anemia    Anxiety    Cancer ([place])    skin cancer   Hyperlipidemia    Hypertension    Hypothyroidism    Sciatica    Sleep apnea    does not use cpap   Thyroid disease     Current Outpatient Medications  Medication Sig Dispense Refill   acetaminophen (TYLENOL) 500 MG tablet Take 500 mg by mouth every 6 (six) hours as needed for moderate pain.      citalopram (CELEXA) 20 MG tablet TAKE 1 TABLET(20 MG) BY MOUTH AT BEDTIME (Patient taking differently: Take 10 mg by mouth at bedtime. TAKE 1 TABLET(20 MG) BY MOUTH AT BEDTIME) 90 tablet 1   levothyroxine (SYNTHROID) 150 MCG tablet Take 1 tablet (150 mcg total) by mouth daily. 90 tablet 0   losartan (COZAAR) 100 MG tablet Take 1 tablet (100 mg total) by mouth daily. 90 tablet 1   metoprolol succinate (TOPROL-XL) 50 MG 24 hr tablet TAKE 1 TABLET(50 MG) BY MOUTH EVERY MORNING 90 tablet 0   rOPINIRole (REQUIP) 0.5 MG tablet Take 1 tablet (0.5 mg total) by mouth at bedtime. 90 tablet 1   simvastatin (ZOCOR) 40 MG tablet Take 40 mg by mouth daily.     traZODone (DESYREL) 50 MG tablet TAKE 1 TABLET(50 MG) BY MOUTH AT BEDTIME 90 tablet 1   No current facility-administered medications for this visit.    Allergies  Allergen Reactions   Codeine Nausea Only    Tolerates oxycodone   Morphine Nausea And Vomiting   Ace Inhibitors Cough   Adhesive [Tape] Other (See Comments) and Rash    whelps    Family History   Problem Relation Age of Onset   Breast cancer Paternal Aunt    Dementia Mother    Dementia Sister    Heart attack Brother    Diabetes Brother    Heart attack Son    Hypercalcemia Neg Hx     Social History   Socioeconomic History   Marital status: Married    Spouse name: Not on file   Number of children: Not on file   Years of education: Not on file   Highest education level: Not on file  Occupational History   Occupation: retired  Tobacco Use   Smoking status: Every Day    Packs/day: 0.50    Years: 40.00    Pack years: 20.00    Types: Cigarettes   Smokeless tobacco: Never  Vaping Use   Vaping Use: Never used  Substance and Sexual Activity   Alcohol use: No   Drug use: No   Sexual activity: Not Currently  Other Topics Concern   Not on file  Social History Narrative   Not on file   Social Determinants of Health   Financial Resource Strain: Low Risk    Difficulty of Paying Living Expenses: Not hard at all  Food Insecurity: No Food Insecurity   Worried About Charity fundraiser in the Last Year: Never true   Ran Out of Food in the Last Year: Never true  Transportation Needs: No Transportation Needs   Lack of Transportation (Medical): No   Lack of Transportation (Non-Medical): No  Physical Activity: Inactive   Days of Exercise per Week: 0 days   Minutes of Exercise per Session: 0 min  Stress: No Stress Concern Present   Feeling of Stress : Not at all  Social Connections: Not on file  Intimate Partner Violence: Not on file     Constitutional: Denies fever, malaise, fatigue, headache or abrupt weight changes.  Respiratory: Denies difficulty breathing, shortness of breath, cough or sputum production.   Cardiovascular: Denies chest pain, chest tightness, palpitations or swelling in the hands or feet.  Musculoskeletal: Patient reports jitteriness.  Denies decrease in range of motion, difficulty with gait, muscle pain or joint pain and swelling.  Neurological:  Denies dizziness, difficulty with memory, difficulty with speech or problems with balance and coordination.  Psych: Patient reports anxiety.  Denies depression, SI/HI.  No other specific complaints in a complete review of systems (except as listed in HPI above).  Objective:   Physical Exam  BP (!) 169/75 (BP Location: Right Arm, Patient Position: Sitting, Cuff Size: Normal)   Pulse 63   Temp 97.8 F (36.6 C) (Temporal)   Resp 17   Ht '5\' 3"'$  (1.6 m)   Wt 167 lb 12.8 oz (76.1 kg)   LMP  (LMP Unknown)   SpO2 96%   BMI 29.72 kg/m   Wt Readings from Last 3 Encounters:  04/23/21 170 lb (77.1 kg)  04/17/21 171 lb 12.8 oz (77.9 kg)  03/21/21 173 lb 6.4 oz (78.7 kg)    General: Appears her stated age, well developed, well nourished in NAD. HEENT: Head: normal shape and size; Eyes: sclera white and EOMs intact;  Cardiovascular: Normal rate. Pulmonary/Chest: Normal effort and positive vesicular breath sounds.  Musculoskeletal:  No difficulty with gait.  Neurological: Alert and oriented.  Psychiatric: Mood and affect normal.  Anxious appearing. Judgment and thought content normal.    BMET    Component Value Date/Time   NA 144 01/29/2021 1028   NA 145 (H) 12/21/2017 1017   NA 139 08/26/2012 2035   K 3.7 01/29/2021 1028   K 3.3 (L) 08/26/2012 2035   CL 109 01/29/2021 1028   CL 105 08/26/2012 2035   CO2 29 01/29/2021 1028   CO2 29 08/26/2012 2035   GLUCOSE 96 01/29/2021 1028   GLUCOSE 97 08/26/2012 2035   BUN 15 01/29/2021 1028   BUN 13 12/21/2017 1017   BUN 13 08/26/2012 2035   CREATININE 0.74 01/29/2021 1028   CALCIUM 10.1 01/29/2021 1028   CALCIUM 9.9 08/26/2012 2035   GFRNONAA 66 09/11/2020 1147   GFRAA 76 09/11/2020 1147    Lipid Panel     Component Value Date/Time   CHOL 207 (H) 11/01/2018 0925   CHOL 181 12/21/2017 1017   TRIG 124 11/01/2018 0925   HDL 52 11/01/2018 0925   HDL 50 12/21/2017 1017   CHOLHDL 4.0 11/01/2018 0925   LDLCALC 132 (H) 11/01/2018 0925     CBC    Component Value Date/Time   WBC 7.8 01/29/2021 1028   RBC 4.88 01/29/2021 1028   HGB 15.9 (H) 01/29/2021 1028   HGB 14.8 12/21/2017 1017   HCT 47.4 (H) 01/29/2021 1028   HCT 42.5 12/21/2017 1017  PLT 123 (L) 01/29/2021 1028   PLT 141 (L) 12/21/2017 1017   MCV 97.1 01/29/2021 1028   MCV 93 12/21/2017 1017   MCV 92 08/26/2012 2035   MCH 32.6 01/29/2021 1028   MCHC 33.5 01/29/2021 1028   RDW 12.5 01/29/2021 1028   RDW 12.8 12/21/2017 1017   RDW 13.1 08/26/2012 2035   LYMPHSABS 2,473 01/29/2021 1028   LYMPHSABS 2.5 12/21/2017 1017   EOSABS 109 01/29/2021 1028   EOSABS 0.3 12/21/2017 1017   BASOSABS 70 01/29/2021 1028   BASOSABS 0.0 12/21/2017 1017    Hgb A1C No results found for: HGBA1C          Assessment & Plan:   Anxiety, Jitteriness, Hypertension:  Explained to her that sometimes anesthesia can caused jitteriness and increased anxiety but she is 2 weeks out from her colonoscopy and this should have worn off by at this time POCT A1c 5.3 Will increase Citalopram to 20 mg daily Support offered BP elevated today, manual repeat a little lower but still elevated. May need to consider adding Amlodipine to Losartan and metoprolol Will monitor for now  Return precautions discussed [name], NP This visit occurred during the SARS-CoV-2 public health emergency.  Safety protocols were in place, including screening questions prior to the visit, additional usage of staff PPE, and extensive cleaning of exam room while observing appropriate contact time as indicated for disinfecting solutions.

## 2021-05-20 ENCOUNTER — Other Ambulatory Visit: Payer: Self-pay | Admitting: Internal Medicine

## 2021-05-20 ENCOUNTER — Ambulatory Visit: Payer: PPO | Admitting: Gastroenterology

## 2021-05-20 ENCOUNTER — Encounter: Payer: Self-pay | Admitting: Internal Medicine

## 2021-05-20 ENCOUNTER — Other Ambulatory Visit: Payer: Self-pay

## 2021-05-20 ENCOUNTER — Ambulatory Visit (INDEPENDENT_AMBULATORY_CARE_PROVIDER_SITE_OTHER): Payer: PPO | Admitting: Internal Medicine

## 2021-05-20 VITALS — BP 182/80 | HR 57 | Temp 97.7°F | Resp 18 | Ht 63.0 in | Wt 170.8 lb

## 2021-05-20 DIAGNOSIS — E6609 Other obesity due to excess calories: Secondary | ICD-10-CM

## 2021-05-20 DIAGNOSIS — R519 Headache, unspecified: Secondary | ICD-10-CM

## 2021-05-20 DIAGNOSIS — Z6830 Body mass index (BMI) 30.0-30.9, adult: Secondary | ICD-10-CM

## 2021-05-20 DIAGNOSIS — E039 Hypothyroidism, unspecified: Secondary | ICD-10-CM | POA: Diagnosis not present

## 2021-05-20 DIAGNOSIS — I10 Essential (primary) hypertension: Secondary | ICD-10-CM

## 2021-05-20 DIAGNOSIS — R29898 Other symptoms and signs involving the musculoskeletal system: Secondary | ICD-10-CM | POA: Diagnosis not present

## 2021-05-20 MED ORDER — AMLODIPINE BESYLATE 5 MG PO TABS: 5.0000 mg | ORAL_TABLET | Freq: Every day | ORAL | 0 refills | Status: DC

## 2021-05-20 NOTE — Patient Instructions (Signed)

## 2021-05-20 NOTE — Telephone Encounter (Signed)
Requested medication (s) are due for refill today: see encounter. 30 day supply signed   Requested medication (s) are on the active medication list: yes   Last refill:  05/20/21 #30 0 refills  Future visit scheduled: yes in 2 weeks  Notes to clinic:  patient requesting 90 day supply      Requested Prescriptions  Pending Prescriptions Disp Refills   amLODipine (NORVASC) 5 MG tablet [Pharmacy Med Name: AMLODIPINE BESYLATE '5MG'$  TABLETS] 90 tablet     Sig: TAKE 1 TABLET(5 MG) BY MOUTH DAILY     Cardiovascular:  Calcium Channel Blockers Failed - 05/20/2021  9:18 AM      Failed - Last BP in normal range    BP Readings from Last 1 Encounters:  05/20/21 (!) 182/80          Passed - Valid encounter within last 6 months    Recent Outpatient Visits           Today Primary hypertension   [place], Coralie Keens, NP   1 week ago Feeling jittery   [facility] [place], Coralie Keens, NP   1 month ago Essential hypertension   [place], Coralie Keens, NP   2 months ago Peripheral edema   [facility] [place], Coralie Keens, NP   3 months ago Medication monitoring encounter   [facility] [place], Coralie Keens, NP       Future Appointments             Tomorrow Virgel Manifold, MD [place]   In 2 weeks [place], Coralie Keens, NP [facility], [place]   In 3 months [place], Coralie Keens, NP [facility], [facility]

## 2021-05-20 NOTE — Progress Notes (Signed)
Subjective:    Patient ID: [name], female    DOB: [birth year], 80 y.o.   MRN: [id number]  HPI  Patient presents the clinic today with complaint of a constant roaring in her head and bilateral leg weakness.  She reports this has been an ongoing issue.  She was seen in 8/17 for the same.  She describes the roaring as a buzzing.  She reports intermittent lightheadedness but denies visual changes.  She denies neck pain.  She denies chest pain, shortness of breath.  She reports her legs feel weak and she is unable to walk any long distance.  She is having difficulty even getting to her mailbox.  She denies recent falls.  She has been taking her medications as prescribed.  Her BP today is 197/71.  She is concerned that her thyroid might be out of range because she has been feeling off lately.  Review of Systems     Past Medical History:  Diagnosis Date   Allergy    Anemia    Anxiety    Cancer ([place])    skin cancer   Hyperlipidemia    Hypertension    Hypothyroidism    Sciatica    Sleep apnea    does not use cpap   Thyroid disease     Current Outpatient Medications  Medication Sig Dispense Refill   acetaminophen (TYLENOL) 500 MG tablet Take 500 mg by mouth every 6 (six) hours as needed for moderate pain.      cetirizine (ZYRTEC) 10 MG tablet Take 10 mg by mouth daily.     citalopram (CELEXA) 20 MG tablet Take 1 tablet (20 mg total) by mouth daily. 90 tablet 1   levothyroxine (SYNTHROID) 150 MCG tablet Take 1 tablet (150 mcg total) by mouth daily. 90 tablet 0   losartan (COZAAR) 100 MG tablet Take 1 tablet (100 mg total) by mouth daily. 90 tablet 1   metoprolol succinate (TOPROL-XL) 50 MG 24 hr tablet TAKE 1 TABLET(50 MG) BY MOUTH EVERY MORNING 90 tablet 0   Multiple Vitamin (MULTIVITAMIN) capsule Take 1 capsule by mouth daily.     rOPINIRole (REQUIP) 0.5 MG tablet Take 1 tablet (0.5 mg total) by mouth at bedtime. 90 tablet 1   simvastatin (ZOCOR) 40 MG tablet Take 40 mg by  mouth daily.     traZODone (DESYREL) 50 MG tablet TAKE 1 TABLET(50 MG) BY MOUTH AT BEDTIME 90 tablet 1   No current facility-administered medications for this visit.    Allergies  Allergen Reactions   Codeine Nausea Only    Tolerates oxycodone   Morphine Nausea And Vomiting   Ace Inhibitors Cough   Adhesive [Tape] Other (See Comments) and Rash    whelps    Family History  Problem Relation Age of Onset   Breast cancer Paternal Aunt    Dementia Mother    Dementia Sister    Heart attack Brother    Diabetes Brother    Heart attack Son    Hypercalcemia Neg Hx     Social History   Socioeconomic History   Marital status: Married    Spouse name: Not on file   Number of children: Not on file   Years of education: Not on file   Highest education level: Not on file  Occupational History   Occupation: retired  Tobacco Use   Smoking status: Every Day    Packs/day: 0.50    Years: 40.00    Pack years: 20.00  Types: Cigarettes   Smokeless tobacco: Never  Vaping Use   Vaping Use: Never used  Substance and Sexual Activity   Alcohol use: No   Drug use: No   Sexual activity: Not Currently  Other Topics Concern   Not on file  Social History Narrative   Not on file   Social Determinants of Health   Financial Resource Strain: Low Risk    Difficulty of Paying Living Expenses: Not hard at all  Food Insecurity: No Food Insecurity   Worried About Charity fundraiser in the Last Year: Never true   [place] in the Last Year: Never true  Transportation Needs: No Transportation Needs   Lack of Transportation (Medical): No   Lack of Transportation (Non-Medical): No  Physical Activity: Inactive   Days of Exercise per Week: 0 days   Minutes of Exercise per Session: 0 min  Stress: No Stress Concern Present   Feeling of Stress : Not at all  Social Connections: Not on file  Intimate Partner Violence: Not on file     Constitutional: Patient reports headache.  Denies  fever, malaise, fatigue,  or abrupt weight changes.  HEENT: Denies eye pain, eye redness, ear pain, ringing in the ears, wax buildup, runny nose, nasal congestion, bloody nose, or sore throat. Respiratory: Denies difficulty breathing, shortness of breath, cough or sputum production.   Cardiovascular: Denies chest pain, chest tightness, palpitations or swelling in the hands or feet.  Musculoskeletal: Patient reports bilateral leg weakness.  Denies decrease in range of motion, difficulty with gait, muscle pain or joint pain and swelling.  Skin: Denies redness, rashes, lesions or ulcercations.  Neurological: Patient reports intermittent lightheadedness.  Denies difficulty with memory, difficulty with speech or problems with balance and coordination.    No other specific complaints in a complete review of systems (except as listed in HPI above).  Objective:   Physical Exam  BP (!) 197/71 (BP Location: Right Arm, Patient Position: Sitting, Cuff Size: Normal)   Pulse (!) 57   Temp 97.7 F (36.5 C) (Temporal)   Resp 18   Ht '5\' 3"'$  (1.6 m)   Wt 170 lb 12.8 oz (77.5 kg)   LMP  (LMP Unknown)   SpO2 98%   BMI 30.26 kg/m   Wt Readings from Last 3 Encounters:  05/08/21 167 lb 12.8 oz (76.1 kg)  04/23/21 170 lb (77.1 kg)  04/17/21 171 lb 12.8 oz (77.9 kg)    General: Appears her stated age, obese, in NAD. Skin: Warm, dry and intact.  HEENT: Head: normal shape and size; Eyes: sclera white PERRLA and EOMs intact; Ears: Tm's gray and intact, normal light reflex;  Cardiovascular: Bradycardic with normal rhythm. S1,S2 noted.  No murmur, rubs or gallops noted. No JVD or BLE edema. No carotid bruits noted. Pulmonary/Chest: Normal effort and positive vesicular breath sounds. No respiratory distress. No wheezes, rales or ronchi noted.  Musculoskeletal: Strength 4/5 BLE.  She has difficulty standing on tiptoes and heels without assistance.  Gait slightly unsteady without device. Neurological: Alert and  oriented. Coordination normal but slow.   BMET    Component Value Date/Time   NA 144 01/29/2021 1028   NA 145 (H) 12/21/2017 1017   NA 139 08/26/2012 2035   K 3.7 01/29/2021 1028   K 3.3 (L) 08/26/2012 2035   CL 109 01/29/2021 1028   CL 105 08/26/2012 2035   CO2 29 01/29/2021 1028   CO2 29 08/26/2012 2035   GLUCOSE 96  01/29/2021 1028   GLUCOSE 97 08/26/2012 2035   BUN 15 01/29/2021 1028   BUN 13 12/21/2017 1017   BUN 13 08/26/2012 2035   CREATININE 0.74 01/29/2021 1028   CALCIUM 10.1 01/29/2021 1028   CALCIUM 9.9 08/26/2012 2035   GFRNONAA 66 09/11/2020 1147   GFRAA 76 09/11/2020 1147    Lipid Panel     Component Value Date/Time   CHOL 207 (H) 11/01/2018 0925   CHOL 181 12/21/2017 1017   TRIG 124 11/01/2018 0925   HDL 52 11/01/2018 0925   HDL 50 12/21/2017 1017   CHOLHDL 4.0 11/01/2018 0925   LDLCALC 132 (H) 11/01/2018 0925    CBC    Component Value Date/Time   WBC 7.8 01/29/2021 1028   RBC 4.88 01/29/2021 1028   HGB 15.9 (H) 01/29/2021 1028   HGB 14.8 12/21/2017 1017   HCT 47.4 (H) 01/29/2021 1028   HCT 42.5 12/21/2017 1017   PLT 123 (L) 01/29/2021 1028   PLT 141 (L) 12/21/2017 1017   MCV 97.1 01/29/2021 1028   MCV 93 12/21/2017 1017   MCV 92 08/26/2012 2035   MCH 32.6 01/29/2021 1028   MCHC 33.5 01/29/2021 1028   RDW 12.5 01/29/2021 1028   RDW 12.8 12/21/2017 1017   RDW 13.1 08/26/2012 2035   LYMPHSABS 2,473 01/29/2021 1028   LYMPHSABS 2.5 12/21/2017 1017   EOSABS 109 01/29/2021 1028   EOSABS 0.3 12/21/2017 1017   BASOSABS 70 01/29/2021 1028   BASOSABS 0.0 12/21/2017 1017    Hgb A1C Lab Results  Component Value Date   HGBA1C 5.3 05/08/2021           Assessment & Plan:   Acute Headache, Hypertension:  Advised her I do not think she is feeling well because her blood pressure is elevated. Continue Losartan and Metoprolol We will add Amlodipine 5 mg daily Reinforced DASH diet and exercise for weight loss Bmet  today  Hypothyroidism:  TSH and free T4 today Continue Levothyroxine, will adjust if needed based on labs  Bilateral Leg Weakness:  No evidence of back issues Offered for referral for PT but she declines at this time  RTC in 2 weeks for follow-up HTN [name], NP This visit occurred during the SARS-CoV-2 public health emergency.  Safety protocols were in place, including screening questions prior to the visit, additional usage of staff PPE, and extensive cleaning of exam room while observing appropriate contact time as indicated for disinfecting solutions.

## 2021-05-21 ENCOUNTER — Other Ambulatory Visit: Payer: Self-pay | Admitting: Unknown Physician Specialty

## 2021-05-21 ENCOUNTER — Encounter: Payer: Self-pay | Admitting: Gastroenterology

## 2021-05-21 ENCOUNTER — Ambulatory Visit (INDEPENDENT_AMBULATORY_CARE_PROVIDER_SITE_OTHER): Payer: PPO | Admitting: Gastroenterology

## 2021-05-21 VITALS — BP 162/78 | HR 57 | Temp 97.8°F | Wt 171.8 lb

## 2021-05-21 DIAGNOSIS — F5104 Psychophysiologic insomnia: Secondary | ICD-10-CM

## 2021-05-21 DIAGNOSIS — R197 Diarrhea, unspecified: Secondary | ICD-10-CM | POA: Diagnosis not present

## 2021-05-21 LAB — BASIC METABOLIC PANEL
BUN: 14 mg/dL (ref 7–25)
CO2: 29 mmol/L (ref 20–32)
Calcium: 9.8 mg/dL (ref 8.6–10.4)
Chloride: 109 mmol/L (ref 98–110)
Creat: 0.75 mg/dL (ref 0.60–1.00)
Glucose, Bld: 89 mg/dL (ref 65–139)
Potassium: 4.2 mmol/L (ref 3.5–5.3)
Sodium: 142 mmol/L (ref 135–146)

## 2021-05-21 LAB — T4, FREE: Free T4: 1.3 ng/dL (ref 0.8–1.8)

## 2021-05-21 LAB — TSH: TSH: 1.31 mIU/L (ref 0.40–4.50)

## 2021-05-21 NOTE — Progress Notes (Signed)
Vonda Antigua, MD [address]  [place]  [place], [place] [address]  Main: [phone]  Fax: [phone]   Primary Care Physician: [name], NP   CC: Diarrhea  HPI: [name] is a 80 y.o. female here for follow up of diarrhea. Colonoscopy with biopsies did not show any evidence of microscopic colitis. Pt reports resolution of diarrhea at this time. Denies any abdominal pain, N/V.    ROS: All ROS reviewed and negative except as per HPI   Past Medical History:  Diagnosis Date   Allergy    Anemia    Anxiety    Cancer ([place])    skin cancer   Hyperlipidemia    Hypertension    Hypothyroidism    Sciatica    Sleep apnea    does not use cpap   Thyroid disease     Past Surgical History:  Procedure Laterality Date   ABDOMINAL HYSTERECTOMY     APPENDECTOMY     BACK SURGERY     lower   BILATERAL CARPAL TUNNEL RELEASE     BREAST CYST ASPIRATION Left    neg   COLONOSCOPY     COLONOSCOPY WITH PROPOFOL N/A 04/23/2021   Procedure: COLONOSCOPY WITH PROPOFOL;  Surgeon: Virgel Manifold, MD;  Location: [facility];  Service: Endoscopy;  Laterality: N/A;   ENTROPIAN REPAIR Left 06/08/2020   Procedure: ENTROPION REPAIR, SUTURES ENTROPION REPAIR, EXTENSIVE LEFT;  Surgeon: Karle Starch, MD;  Location: [facility];  Service: Ophthalmology;  Laterality: Left;   JOINT REPLACEMENT     KNEE SURGERY Bilateral    KNEE SURGERY     OPEN REDUCTION INTERNAL FIXATION (ORIF) DISTAL RADIAL FRACTURE Left 06/23/2019   Procedure: OPEN REDUCTION INTERNAL FIXATION (ORIF) DISTAL RADIAL FRACTURE;  Surgeon: Hessie Knows, MD;  Location: [facility];  Service: Orthopedics;  Laterality: Left;   PARATHYROIDECTOMY  04/27/2020   SPINAL CORD STIMULATOR IMPLANT Right    THYROIDECTOMY Right 04/27/2020   Procedure: PARATHYROIDECTOMY;  Surgeon: Johnathan Hausen, MD;  Location: [facility];  Service: General;  Laterality: Right;   TOTAL KNEE REVISION Right 08/13/2015   Procedure:  TOTAL KNEE REVISION;  Surgeon: Paralee Cancel, MD;  Location: [facility];  Service: Orthopedics;  Laterality: Right;    Prior to Admission medications   Medication Sig Start Date End Date Taking? Authorizing Provider  acetaminophen (TYLENOL) 500 MG tablet Take 500 mg by mouth every 6 (six) hours as needed for moderate pain.    Yes [provider]  amLODipine (NORVASC) 5 MG tablet Take 1 tablet (5 mg total) by mouth daily. 05/20/21  Yes [name], NP  cetirizine (ZYRTEC) 10 MG tablet Take 10 mg by mouth daily.   Yes [provider]  citalopram (CELEXA) 20 MG tablet Take 1 tablet (20 mg total) by mouth daily. 05/08/21  Yes [name], NP  levothyroxine (SYNTHROID) 150 MCG tablet Take 1 tablet (150 mcg total) by mouth daily. 03/18/21  Yes [name], NP  losartan (COZAAR) 100 MG tablet Take 1 tablet (100 mg total) by mouth daily. 04/17/21  Yes [name], NP  metoprolol succinate (TOPROL-XL) 50 MG 24 hr tablet TAKE 1 TABLET(50 MG) BY MOUTH EVERY MORNING 04/10/21  Yes Karamalegos, Devonne Doughty, DO  Multiple Vitamin (MULTIVITAMIN) capsule Take 1 capsule by mouth daily.   Yes [provider]  rOPINIRole (REQUIP) 0.5 MG tablet Take 1 tablet (0.5 mg total) by mouth at bedtime. 05/02/21  Yes Baity, Coralie Keens,  NP  simvastatin (ZOCOR) 40 MG tablet Take 40 mg by mouth daily.   Yes [provider]  traZODone (DESYREL) 50 MG tablet TAKE 1 TABLET(50 MG) BY MOUTH AT BEDTIME 12/04/20  Yes Kathrine Haddock, NP    Family History  Problem Relation Age of Onset   Breast cancer Paternal 48    Dementia Mother    Dementia Sister    Heart attack Brother    Diabetes Brother    Heart attack Son    Hypercalcemia Neg Hx      Social History   Tobacco Use   Smoking status: Every Day    Packs/day: 0.50    Years: 40.00    Pack years: 20.00    Types: Cigarettes   Smokeless tobacco: Never  Vaping Use   Vaping Use: Never used  Substance Use Topics   Alcohol use: No    Drug use: No    Allergies as of 05/21/2021 - Review Complete 05/21/2021  Allergen Reaction Noted   Codeine Nausea Only 02/22/2014   Morphine Nausea And Vomiting 06/12/2015   Ace inhibitors Cough 03/16/2018   Adhesive [tape] Other (See Comments) and Rash 02/22/2014    Physical Examination:  Constitutional: General:   Alert,  Well-developed, well-nourished, pleasant and cooperative in NAD BP (!) 162/78   Pulse (!) 57   Temp 97.8 F (36.6 C) (Oral)   Wt 171 lb 12.8 oz (77.9 kg)   LMP  (LMP Unknown)   BMI 30.43 kg/m   Respiratory: Normal respiratory effort  Gastrointestinal:  Soft, non-tender and non-distended without masses, hepatosplenomegaly or hernias noted.  No guarding or rebound tenderness.     Cardiac: No clubbing or edema.  No cyanosis. Normal posterior tibial pedal pulses noted.  Psych:  Alert and cooperative. Normal mood and affect.  Musculoskeletal:  Normal gait. Head normocephalic, atraumatic. Symmetrical without gross deformities. 5/5 Lower extremity strength bilaterally.  Skin: Warm. Intact without significant lesions or rashes. No jaundice.  Neck: Supple, trachea midline  Lymph: No cervical lymphadenopathy  Psych:  Alert and oriented x3, Alert and cooperative. Normal mood and affect.  Labs: CMP     Component Value Date/Time   NA 142 05/20/2021 0929   NA 145 (H) 12/21/2017 1017   NA 139 08/26/2012 2035   K 4.2 05/20/2021 0929   K 3.3 (L) 08/26/2012 2035   CL 109 05/20/2021 0929   CL 105 08/26/2012 2035   CO2 29 05/20/2021 0929   CO2 29 08/26/2012 2035   GLUCOSE 89 05/20/2021 0929   GLUCOSE 97 08/26/2012 2035   BUN 14 05/20/2021 0929   BUN 13 12/21/2017 1017   BUN 13 08/26/2012 2035   CREATININE 0.75 05/20/2021 0929   CALCIUM 9.8 05/20/2021 0929   CALCIUM 9.9 08/26/2012 2035   PROT 7.0 01/29/2021 1028   PROT 7.1 12/21/2017 1017   PROT 7.9 08/26/2012 2035   ALBUMIN 4.5 12/21/2017 1017   ALBUMIN 4.3 08/26/2012 2035   AST 16 01/29/2021 1028    AST 21 08/26/2012 2035   ALT 18 01/29/2021 1028   ALT 30 08/26/2012 2035   ALKPHOS 88 12/21/2017 1017   ALKPHOS 89 08/26/2012 2035   BILITOT 0.7 01/29/2021 1028   BILITOT 0.6 12/21/2017 1017   BILITOT 0.6 08/26/2012 2035   GFRNONAA 66 09/11/2020 1147   GFRAA 76 09/11/2020 1147   Lab Results  Component Value Date   WBC 7.8 01/29/2021   HGB 15.9 (H) 01/29/2021   HCT 47.4 (H) 01/29/2021   MCV 97.1  01/29/2021   PLT 123 (L) 01/29/2021    Imaging Studies:   Assessment and Plan:   [name] is a 80 y.o. y/o female here for follow up of diarrhea  Pt underwent colonoscopy which did not show any evidence of microscopic colitis. Symptoms have resolved at this time and pt is reporting formed Bms with no incontinence episodes  Pt does not have any complaints at this time and was advised to call me if symptoms re-occur.   Avoid excessive caffeine intake and avoid beverages with sugars or sugar substitutes  Colonoscopy and pathology results discussed with pt as well    Dr Vonda Antigua

## 2021-05-21 NOTE — Telephone Encounter (Signed)
Requested Prescriptions  Pending Prescriptions Disp Refills  . traZODone (DESYREL) 50 MG tablet [Pharmacy Med Name: TRAZODONE '50MG'$  TABLETS] 90 tablet 0    Sig: TAKE 1 TABLET(50 MG) BY MOUTH AT BEDTIME     Psychiatry: Antidepressants - Serotonin Modulator Passed - 05/21/2021  1:28 PM      Passed - Valid encounter within last 6 months    Recent Outpatient Visits          Yesterday Primary hypertension   [place], Coralie Keens, NP   1 week ago Feeling jittery   [facility] [place], Coralie Keens, NP   1 month ago Essential hypertension   [place], Coralie Keens, NP   2 months ago Peripheral edema   [facility] [place], Coralie Keens, NP   3 months ago Medication monitoring encounter   [facility] [place], Coralie Keens, NP      Future Appointments            In 1 week Garnette Gunner, Coralie Keens, NP [facility], [place]   In 3 months [place], Coralie Keens, NP [facility], [facility]

## 2021-06-03 ENCOUNTER — Ambulatory Visit: Payer: PPO | Admitting: Internal Medicine

## 2021-06-10 ENCOUNTER — Encounter: Payer: Self-pay | Admitting: Internal Medicine

## 2021-06-10 ENCOUNTER — Other Ambulatory Visit: Payer: Self-pay

## 2021-06-10 ENCOUNTER — Ambulatory Visit (INDEPENDENT_AMBULATORY_CARE_PROVIDER_SITE_OTHER): Payer: PPO | Admitting: Internal Medicine

## 2021-06-10 VITALS — BP 151/68 | HR 60 | Temp 98.2°F | Resp 17 | Ht 63.0 in | Wt 168.4 lb

## 2021-06-10 DIAGNOSIS — D696 Thrombocytopenia, unspecified: Secondary | ICD-10-CM

## 2021-06-10 DIAGNOSIS — F439 Reaction to severe stress, unspecified: Secondary | ICD-10-CM | POA: Diagnosis not present

## 2021-06-10 DIAGNOSIS — E663 Overweight: Secondary | ICD-10-CM

## 2021-06-10 DIAGNOSIS — I10 Essential (primary) hypertension: Secondary | ICD-10-CM | POA: Diagnosis not present

## 2021-06-10 DIAGNOSIS — Z6829 Body mass index (BMI) 29.0-29.9, adult: Secondary | ICD-10-CM | POA: Diagnosis not present

## 2021-06-10 DIAGNOSIS — G2581 Restless legs syndrome: Secondary | ICD-10-CM

## 2021-06-10 DIAGNOSIS — Z23 Encounter for immunization: Secondary | ICD-10-CM

## 2021-06-10 DIAGNOSIS — R519 Headache, unspecified: Secondary | ICD-10-CM

## 2021-06-10 DIAGNOSIS — R4189 Other symptoms and signs involving cognitive functions and awareness: Secondary | ICD-10-CM

## 2021-06-10 DIAGNOSIS — E039 Hypothyroidism, unspecified: Secondary | ICD-10-CM

## 2021-06-10 LAB — CBC WITH DIFFERENTIAL/PLATELET
Absolute Monocytes: 570 cells/uL (ref 200–950)
Basophils Absolute: 67 cells/uL (ref 0–200)
Basophils Relative: 1 %
Eosinophils Absolute: 101 cells/uL (ref 15–500)
Eosinophils Relative: 1.5 %
HCT: 50 % — ABNORMAL HIGH (ref 35.0–45.0)
Hemoglobin: 16.2 g/dL — ABNORMAL HIGH (ref 11.7–15.5)
Lymphs Abs: 2379 cells/uL (ref 850–3900)
MCH: 31.8 pg (ref 27.0–33.0)
MCHC: 32.4 g/dL (ref 32.0–36.0)
MCV: 98.2 fL (ref 80.0–100.0)
MPV: 13.3 fL — ABNORMAL HIGH (ref 7.5–12.5)
Monocytes Relative: 8.5 %
Neutro Abs: 3585 cells/uL (ref 1500–7800)
Neutrophils Relative %: 53.5 %
Platelets: 118 10*3/uL — ABNORMAL LOW (ref 140–400)
RBC: 5.09 10*6/uL (ref 3.80–5.10)
RDW: 12.1 % (ref 11.0–15.0)
Total Lymphocyte: 35.5 %
WBC: 6.7 10*3/uL (ref 3.8–10.8)

## 2021-06-10 MED ORDER — ROPINIROLE HCL 0.5 MG PO TABS: 0.5000 mg | ORAL_TABLET | Freq: Every day | ORAL | 0 refills | Status: DC

## 2021-06-10 MED ORDER — CETIRIZINE HCL 10 MG PO TABS: 10.0000 mg | ORAL_TABLET | Freq: Every day | ORAL | 0 refills | Status: DC

## 2021-06-10 MED ORDER — LEVOTHYROXINE SODIUM 150 MCG PO TABS: 150.0000 ug | ORAL_TABLET | Freq: Every day | ORAL | 0 refills | Status: DC

## 2021-06-10 NOTE — Patient Instructions (Signed)
Thrombocytopenia Thrombocytopenia means that you have a low number of platelets in your blood. Platelets are tiny cells in the blood. When you bleed, they clump together at the cut or injury to stop the bleeding. This is called blood clotting. If you do not have enough platelets, it can cause bleeding problems. Some cases of this condition are mild while others are more severe. What are the causes? This condition may be caused by: Your body not making enough platelets. This may be caused by: Your bone marrow not making blood cells (aplastic anemia). Cancer in the bone marrow. Certain medicines. Infection in the bone marrow. Drinking a lot of alcohol. Your body destroying platelets too quickly. This may be caused by: Certain immune diseases. Certain medicines. Certain blood clotting disorders. Certain disorders that are passed from parent to child (inherited). Certain bleeding disorders. Pregnancy. Having a spleen that is larger than normal. What are the signs or symptoms? Bleeding that is not normal. Nosebleeds. Heavy menstrual periods. Blood in the pee (urine) or poop (stool). A purple-like color to the skin (purpura). Bruising. A rash that looks like pinpoint, purple-red spots (petechiae). How is this treated? Treatment of another condition that is causing the low platelet count. Medicines to help protect your platelets from being destroyed. A replacement (transfusion) of platelets to stop or prevent bleeding. Surgery to remove the spleen. Follow these instructions at home: Activity Avoid activities that could cause you to get hurt or bruised. Follow instructions about how to prevent falls. Take care not to cut yourself: When you shave. When you use scissors, needles, knives, or other tools. Take care not to burn yourself: When you use an iron. When you cook. General instructions  Check your skin and the inside of your mouth for bruises or blood as told by your  doctor. Check to see if there is blood in your spit (sputum), pee, and poop. Do this as told by your doctor. Do not drink alcohol. Take over-the-counter and prescription medicines only as told by your doctor. Do not take any medicines that have aspirin or NSAIDs in them. These medicines can thin your blood and cause you to bleed. Tell all of your doctors that you have this condition. Be sure to tell your dentist and eye doctor too. Contact a doctor if: You have bruises and you do not know why. Get help right away if: You are bleeding anywhere on your body. You have blood in your spit, pee, or poop. Summary Thrombocytopenia means that you have a low number of platelets in your blood. Platelets are needed for blood clotting. Symptoms of this condition include bleeding that is not normal, and bruising. Take care not to cut or burn yourself. This information is not intended to replace advice given to you by your health care provider. Make sure you discuss any questions you have with your health care provider. Document Revised: 12/18/2020 Document Reviewed: 06/10/2018 Elsevier Patient Education  [place].

## 2021-06-10 NOTE — Assessment & Plan Note (Signed)
Uncontrolled, repeat 151/68 Unclear if she is actually taking her blood pressure medication although she says she is She feels like stress is a contributing factor- discussed ways to mediate this Continue Losartan, Metoprolol and Amlodpine Reinforced DASH diet and exercise for weight loss Will monitor

## 2021-06-10 NOTE — Assessment & Plan Note (Signed)
Levothyroxine refilled today

## 2021-06-10 NOTE — Assessment & Plan Note (Signed)
CBC and CMET today

## 2021-06-10 NOTE — Assessment & Plan Note (Signed)
Encouraged diet and exercise for weight loss ?

## 2021-06-10 NOTE — Progress Notes (Signed)
Subjective:    Patient ID: [name], female    DOB: 04/29/1941, 80 y.o.   MRN: [id number]  HPI  Pt presents to the clinic today for follow up of HTN. At her last visit, she was complaining of headaches and a constant roaring sound in her ears. Her blood pressure was elevated, so we added Amlodipine in addition to her Losartan and Metoprolol. She has been taking the medication as prescribed and denies adverse side effects. Her BP today is 168/66. ECG from 05/2019 reviewed.  She also reports someone called her and told her that her platelets were low. Her last platelet count was 123, 01/2021. She denies any history of liver disease. She denies bleeding or bruising. She is not currently taking an ASA over the counter.  She also needs a refill of her Zyrtec, Ropinerole and Levothyroxine.  Review of Systems     Past Medical History:  Diagnosis Date   Allergy    Anemia    Anxiety    Cancer ([place])    skin cancer   Hyperlipidemia    Hypertension    Hypothyroidism    Sciatica    Sleep apnea    does not use cpap   Thyroid disease     Current Outpatient Medications  Medication Sig Dispense Refill   acetaminophen (TYLENOL) 500 MG tablet Take 500 mg by mouth every 6 (six) hours as needed for moderate pain.      amLODipine (NORVASC) 5 MG tablet Take 1 tablet (5 mg total) by mouth daily. 30 tablet 0   cetirizine (ZYRTEC) 10 MG tablet Take 10 mg by mouth daily.     citalopram (CELEXA) 20 MG tablet Take 1 tablet (20 mg total) by mouth daily. 90 tablet 1   levothyroxine (SYNTHROID) 150 MCG tablet Take 1 tablet (150 mcg total) by mouth daily. 90 tablet 0   losartan (COZAAR) 100 MG tablet Take 1 tablet (100 mg total) by mouth daily. 90 tablet 1   metoprolol succinate (TOPROL-XL) 50 MG 24 hr tablet TAKE 1 TABLET(50 MG) BY MOUTH EVERY MORNING 90 tablet 0   Multiple Vitamin (MULTIVITAMIN) capsule Take 1 capsule by mouth daily.     rOPINIRole (REQUIP) 0.5 MG tablet Take 1 tablet (0.5 mg  total) by mouth at bedtime. 90 tablet 1   simvastatin (ZOCOR) 40 MG tablet Take 40 mg by mouth daily.     traZODone (DESYREL) 50 MG tablet TAKE 1 TABLET(50 MG) BY MOUTH AT BEDTIME 90 tablet 0   No current facility-administered medications for this visit.    Allergies  Allergen Reactions   Codeine Nausea Only    Tolerates oxycodone   Morphine Nausea And Vomiting   Ace Inhibitors Cough   Adhesive [Tape] Other (See Comments) and Rash    whelps    Family History  Problem Relation Age of Onset   Breast cancer Paternal Aunt    Dementia Mother    Dementia Sister    Heart attack Brother    Diabetes Brother    Heart attack Son    Hypercalcemia Neg Hx     Social History   Socioeconomic History   Marital status: Married    Spouse name: Not on file   Number of children: Not on file   Years of education: Not on file   Highest education level: Not on file  Occupational History   Occupation: retired  Tobacco Use   Smoking status: Every Day    Packs/day: 0.50  Years: 40.00    Pack years: 20.00    Types: Cigarettes   Smokeless tobacco: Never  Vaping Use   Vaping Use: Never used  Substance and Sexual Activity   Alcohol use: No   Drug use: No   Sexual activity: Not Currently  Other Topics Concern   Not on file  Social History Narrative   Not on file   Social Determinants of Health   Financial Resource Strain: Low Risk    Difficulty of Paying Living Expenses: Not hard at all  Food Insecurity: No Food Insecurity   Worried About Charity fundraiser in the Last Year: Never true   [place] in the Last Year: Never true  Transportation Needs: No Transportation Needs   Lack of Transportation (Medical): No   Lack of Transportation (Non-Medical): No  Physical Activity: Inactive   Days of Exercise per Week: 0 days   Minutes of Exercise per Session: 0 min  Stress: No Stress Concern Present   Feeling of Stress : Not at all  Social Connections: Not on file  Intimate  Partner Violence: Not on file     Constitutional: Pt reports headache. Denies fever, malaise, fatigue, or abrupt weight changes.  HEENT: Denies eye pain, eye redness, ear pain, ringing in the ears, wax buildup, runny nose, nasal congestion, bloody nose, or sore throat. Respiratory: Denies difficulty breathing, shortness of breath, cough or sputum production.   Cardiovascular: Denies chest pain, chest tightness, palpitations or swelling in the hands or feet.  Skin: Denies redness, rashes, lesions or ulcercations.  Neurological: Pt reports restless  legs. Denies dizziness, difficulty with memory, difficulty with speech or problems with balance and coordination.  Psych: Pt reports anxiety and stress. Denies depression, SI/HI.  No other specific complaints in a complete review of systems (except as listed in HPI above).  Objective:   Physical Exam BP (!) 151/68   Pulse 60   Temp 98.2 F (36.8 C) (Temporal)   Resp 17   Ht '5\' 3"'$  (1.6 m)   Wt 168 lb 6.4 oz (76.4 kg)   LMP  (LMP Unknown)   SpO2 100%   BMI 29.83 kg/m   Wt Readings from Last 3 Encounters:  05/21/21 171 lb 12.8 oz (77.9 kg)  05/20/21 170 lb 12.8 oz (77.5 kg)  05/08/21 167 lb 12.8 oz (76.1 kg)    General: Appears her stated age, overweight, in NAD. Skin: Warm, dry and intact. No bruising noted. HEENT: Head: normal shape and size; Eyes: sclera white and EOMs intact;  Neck:  Neck supple, trachea midline. No masses, lumps or thyromegaly present.  Cardiovascular: Normal rate and rhythm. S1,S2 noted.  No murmur, rubs or gallops noted. Faint carotid bruit noted on the left. Pulmonary/Chest: Normal effort and positive vesicular breath sounds. No respiratory distress. No wheezes, rales or ronchi noted.  Musculoskeletal:  No difficulty with gait.  Neurological: Alert and oriented. She has some cognitive impairment. She repeats herself frequently. Psychiatric: Mood and affect normal. Anxious appearing. Judgment and thought  content normal.     BMET    Component Value Date/Time   NA 142 05/20/2021 0929   NA 145 (H) 12/21/2017 1017   NA 139 08/26/2012 2035   K 4.2 05/20/2021 0929   K 3.3 (L) 08/26/2012 2035   CL 109 05/20/2021 0929   CL 105 08/26/2012 2035   CO2 29 05/20/2021 0929   CO2 29 08/26/2012 2035   GLUCOSE 89 05/20/2021 0929   GLUCOSE 97 08/26/2012  2035   BUN 14 05/20/2021 0929   BUN 13 12/21/2017 1017   BUN 13 08/26/2012 2035   CREATININE 0.75 05/20/2021 0929   CALCIUM 9.8 05/20/2021 0929   CALCIUM 9.9 08/26/2012 2035   GFRNONAA 66 09/11/2020 1147   GFRAA 76 09/11/2020 1147    Lipid Panel     Component Value Date/Time   CHOL 207 (H) 11/01/2018 0925   CHOL 181 12/21/2017 1017   TRIG 124 11/01/2018 0925   HDL 52 11/01/2018 0925   HDL 50 12/21/2017 1017   CHOLHDL 4.0 11/01/2018 0925   LDLCALC 132 (H) 11/01/2018 0925    CBC    Component Value Date/Time   WBC 7.8 01/29/2021 1028   RBC 4.88 01/29/2021 1028   HGB 15.9 (H) 01/29/2021 1028   HGB 14.8 12/21/2017 1017   HCT 47.4 (H) 01/29/2021 1028   HCT 42.5 12/21/2017 1017   PLT 123 (L) 01/29/2021 1028   PLT 141 (L) 12/21/2017 1017   MCV 97.1 01/29/2021 1028   MCV 93 12/21/2017 1017   MCV 92 08/26/2012 2035   MCH 32.6 01/29/2021 1028   MCHC 33.5 01/29/2021 1028   RDW 12.5 01/29/2021 1028   RDW 12.8 12/21/2017 1017   RDW 13.1 08/26/2012 2035   LYMPHSABS 2,473 01/29/2021 1028   LYMPHSABS 2.5 12/21/2017 1017   EOSABS 109 01/29/2021 1028   EOSABS 0.3 12/21/2017 1017   BASOSABS 70 01/29/2021 1028   BASOSABS 0.0 12/21/2017 1017    Hgb A1C Lab Results  Component Value Date   HGBA1C 5.3 05/08/2021            Assessment & Plan:   Frequent Headaches, Anxiety, Stress, Cognitive Impairment:  Her BP remains elevated so this could be a contributing factor She reports she is taking her blood pressure medication but with her cognitive issues, this is hard to tell Continue Citalopram Consider MRI brain for further  evaluation of headaches and cognitive impairment  Will follow up after labs, return precautions discussed [name], NP This visit occurred during the SARS-CoV-2 public health emergency.  Safety protocols were in place, including screening questions prior to the visit, additional usage of staff PPE, and extensive cleaning of exam room while observing appropriate contact time as indicated for disinfecting solutions.

## 2021-06-10 NOTE — Assessment & Plan Note (Signed)
Ropinerol refilled today

## 2021-06-13 ENCOUNTER — Telehealth: Payer: Self-pay

## 2021-06-13 NOTE — Chronic Care Management (AMB) (Signed)
  Chronic Care Management   Note  06/13/2021 Name: [name] MRN: [id number] DOB: May 08, 1941  [name] is a 80 y.o. year old female who is a primary care patient of [name], NP. I reached out to Brunetta Jeans by phone today in response to a referral sent by [name]'s PCP.  [name] was given information about Chronic Care Management services today including:  CCM service includes personalized support from designated clinical staff supervised by her physician, including individualized plan of care and coordination with other care providers 24/7 contact phone numbers for assistance for urgent and routine care needs. Service will only be billed when office clinical staff spend 20 minutes or more in a month to coordinate care. Only one practitioner may furnish and bill the service in a calendar month. The patient may stop CCM services at any time (effective at the end of the month) by phone call to the office staff. The patient is responsible for co-pay (up to 20% after annual deductible is met) if co-pay is required by the individual health plan.   Patient agreed to services and verbal consent obtained.   Follow up plan: Telephone appointment with care management team member scheduled for:06/24/2021  Noreene Larsson, [address], [place], [place] [address] Direct Dial: [phone] [email or web address]

## 2021-06-13 NOTE — Chronic Care Management (AMB) (Signed)
  Chronic Care Management   Outreach Note  06/13/2021 Name: GLORENE LEITZKE MRN: [id number] DOB: Jul 30, 1941  Idalia Needle Woo is a 80 y.o. year old female who is a primary care patient of [name], NP. I reached out to Brunetta Jeans by phone today in response to a referral sent by Ms. Idalia Needle Quinn's PCP, [name], NP      An unsuccessful telephone outreach was attempted today. The patient was referred to the case management team for assistance with care management and care coordination.   Follow Up Plan: A HIPAA compliant phone message was left for the patient providing contact information and requesting a return call.  The care management team will reach out to the patient again over the next 7 days.  If patient returns call to provider office, please advise to call [facility]  at [place], [place], [place], [place] [address] Direct Dial: [phone] [email or web address]

## 2021-06-14 MED ORDER — ATORVASTATIN CALCIUM 10 MG PO TABS: 10.0000 mg | ORAL_TABLET | Freq: Every day | ORAL | 0 refills | Status: DC

## 2021-06-14 NOTE — Addendum Note (Signed)
Addended by: [name] on: 06/14/2021 10:59 AM   Modules accepted: Orders

## 2021-06-24 ENCOUNTER — Encounter: Payer: Self-pay | Admitting: Internal Medicine

## 2021-06-24 ENCOUNTER — Telehealth: Payer: PPO | Admitting: General Practice

## 2021-06-24 ENCOUNTER — Other Ambulatory Visit: Payer: Self-pay

## 2021-06-24 ENCOUNTER — Ambulatory Visit (INDEPENDENT_AMBULATORY_CARE_PROVIDER_SITE_OTHER): Payer: PPO | Admitting: Internal Medicine

## 2021-06-24 ENCOUNTER — Other Ambulatory Visit: Payer: Self-pay | Admitting: Internal Medicine

## 2021-06-24 ENCOUNTER — Ambulatory Visit (INDEPENDENT_AMBULATORY_CARE_PROVIDER_SITE_OTHER): Payer: PPO

## 2021-06-24 DIAGNOSIS — D696 Thrombocytopenia, unspecified: Secondary | ICD-10-CM

## 2021-06-24 DIAGNOSIS — E7800 Pure hypercholesterolemia, unspecified: Secondary | ICD-10-CM

## 2021-06-24 DIAGNOSIS — F419 Anxiety disorder, unspecified: Secondary | ICD-10-CM

## 2021-06-24 DIAGNOSIS — I700 Atherosclerosis of aorta: Secondary | ICD-10-CM | POA: Diagnosis not present

## 2021-06-24 DIAGNOSIS — R4189 Other symptoms and signs involving cognitive functions and awareness: Secondary | ICD-10-CM

## 2021-06-24 DIAGNOSIS — I10 Essential (primary) hypertension: Secondary | ICD-10-CM

## 2021-06-24 DIAGNOSIS — E039 Hypothyroidism, unspecified: Secondary | ICD-10-CM

## 2021-06-24 NOTE — Chronic Care Management (AMB) (Signed)
Chronic Care Management   CCM RN Visit Note  06/24/2021 Name: STESHA NEYENS MRN: [id number] DOB: [birth year]  Subjective: [name] is a 80 y.o. year old female who is a primary care patient of [name], NP. The care management team was consulted for assistance with disease management and care coordination needs.    Engaged with patient by telephone for initial visit in response to provider referral for case management and/or care coordination services.   Consent to Services:  The patient was given the following information about Chronic Care Management services today, agreed to services, and gave verbal consent: 1. CCM service includes personalized support from designated clinical staff supervised by the primary care provider, including individualized plan of care and coordination with other care providers 2. 24/7 contact phone numbers for assistance for urgent and routine care needs. 3. Service will only be billed when office clinical staff spend 20 minutes or more in a month to coordinate care. 4. Only one practitioner may furnish and bill the service in a calendar month. 5.The patient may stop CCM services at any time (effective at the end of the month) by phone call to the office staff. 6. The patient will be responsible for cost sharing (co-pay) of up to 20% of the service fee (after annual deductible is met). Patient agreed to services and consent obtained.  Patient agreed to services and verbal consent obtained.   Assessment: Review of patient past medical history, allergies, medications, health status, including review of consultants reports, laboratory and other test data, was performed as part of comprehensive evaluation and provision of chronic care management services.   SDOH (Social Determinants of Health) assessments and interventions performed:  SDOH Interventions    Flowsheet Row Most Recent Value  SDOH Interventions   Food Insecurity Interventions  Intervention Not Indicated  Financial Strain Interventions Intervention Not Indicated  Intimate Partner Violence Interventions Intervention Not Indicated  Physical Activity Interventions Other (Comments)  [no structured activity]  Stress Interventions Intervention Not Indicated  Social Connections Interventions Other (Comment)  [good support system]  Transportation Interventions Intervention Not Indicated        CCM Care Plan  Allergies  Allergen Reactions   Codeine Nausea Only    Tolerates oxycodone   Morphine Nausea And Vomiting   Ace Inhibitors Cough   Adhesive [Tape] Other (See Comments) and Rash    whelps    Outpatient Encounter Medications as of 06/24/2021  Medication Sig   acetaminophen (TYLENOL) 500 MG tablet Take 500 mg by mouth every 6 (six) hours as needed for moderate pain.    amLODipine (NORVASC) 5 MG tablet Take 1 tablet (5 mg total) by mouth daily.   atorvastatin (LIPITOR) 10 MG tablet Take 1 tablet (10 mg total) by mouth daily.   citalopram (CELEXA) 20 MG tablet Take 1 tablet (20 mg total) by mouth daily.   levothyroxine (SYNTHROID) 150 MCG tablet Take 1 tablet (150 mcg total) by mouth daily.   losartan (COZAAR) 100 MG tablet Take 1 tablet (100 mg total) by mouth daily.   metoprolol succinate (TOPROL-XL) 50 MG 24 hr tablet TAKE 1 TABLET(50 MG) BY MOUTH EVERY MORNING   Multiple Vitamin (MULTIVITAMIN) capsule Take 1 capsule by mouth daily.   rOPINIRole (REQUIP) 0.5 MG tablet Take 1 tablet (0.5 mg total) by mouth at bedtime.   traZODone (DESYREL) 50 MG tablet TAKE 1 TABLET(50 MG) BY MOUTH AT BEDTIME   No facility-administered encounter medications on file as of 06/24/2021.  Patient Active Problem List   Diagnosis Date Noted   Thrombocytopenia (HCC) 06/10/2021   Cognitive impairment 06/10/2021   Aortic atherosclerosis (HCC) 05/08/2021   Overweight with body mass index (BMI) of 29 to 29.9 in adult 03/18/2021   RLS (restless legs syndrome) 01/29/2021    Obstructive chronic bronchitis without exacerbation (HCC) 01/29/2021   Hyperlipidemia 07/09/2020   Osteopenia of left femoral neck 06/09/2019   Hyperparathyroidism (HCC) 04/15/2019   Acquired hypothyroidism 08/05/2018   Anxiety 08/04/2018   Psychophysiological insomnia 08/04/2018   Osteoarthritis 04/14/2018   Hypertension 11/23/2017    Conditions to be addressed/monitored:HTN, HLD, Anxiety, and thrombocytopenia and hypothryroidsim   Care Plan : RNCM: General Plan of Care (Adult)  Updates made by Marlowe Sax, RN since 06/24/2021 12:00 AM     Problem: RNCM; Adult Plan of Care for Chronic Disease Management and Care Coordination Needs   Priority: High  Onset Date: 06/24/2021     Long-Range Goal: RNCM: Adult Plan of Care for Chronic Disease Management and Care Coordination Needs   Start Date: 06/24/2021  Expected End Date: 06/19/2022  This Visit's Progress: On track  Priority: High  Note:   Current Barriers:  Knowledge Deficits related to plan of care for management of HTN, HLD, Anxiety with Excessive Worry, Social Anxiety,, and thrombocytopenia and hypothyroidism  Chronic Disease Management support and education needs related to HTN, HLD, Anxiety with Excessive Worry, Social Anxiety,, and thrombocytopenia and hypothyroidism  Cognitive Deficits  RNCM Clinical Goal(s):  Patient will verbalize understanding of plan for management of HTN, HLD, Anxiety, and thrombocytopenia and hypothyroidism  verbalize basic understanding of HTN, HLD, Anxiety, and hypothyroidism and thrombocytopenia  disease process and self health management plan   take all medications exactly as prescribed and will call provider for medication related questions demonstrate understanding of rationale for each prescribed medication   attend all scheduled medical appointments: 08-20-2021 at 0800 am demonstrate improved and ongoing adherence to prescribed treatment plan for HTN, HLD, Anxiety, and thrombocytopenia and  hypothyroidism  as evidenced by adherence to prescribed medication regimen contacting provider for new or worsened symptoms or questions   demonstrate improved and ongoing health management independence by taking medications as prescribed, calling the office for changes in conditions, questions, or new concerns continue to work with RN Care Manager to address care management and care coordination needs related to HTN, HLD, Anxiety, and thrombocytopenia, and hypothyroidism   work with pharmacist to address Cognitive Deficits and medication reconciliation and ongoing support related to HTN, HLD, Anxiety, and thrombocytopenia, and hyporthyroidism demonstrate a decrease in HTN, HLD, Anxiety, and hypothyroidism and thrombocytopenia exacerbations   demonstrate ongoing self health care management ability    through collaboration with RN Care manager, provider, and care team.   Interventions: 1:1 collaboration with primary care provider regarding development and update of comprehensive plan of care as evidenced by provider attestation and co-signature Inter-disciplinary care team collaboration (see longitudinal plan of care) Evaluation of current treatment plan related to  self management and patient's adherence to plan as established by provider   SDOH Barriers (Status: New goal. Goal on track: YES.)  Patient interviewed and SDOH assessment performed        SDOH Interventions    Flowsheet Row Most Recent Value  SDOH Interventions   Food Insecurity Interventions Intervention Not Indicated  Financial Strain Interventions Intervention Not Indicated  Intimate Partner Violence Interventions Intervention Not Indicated  Physical Activity Interventions Other (Comments)  [no structured activity]  Stress Interventions Intervention Not Indicated  Social Connections Interventions Other (Comment)  [good support system]  Transportation Interventions Intervention Not Indicated     Patient interviewed and  appropriate assessments performed Provided patient with information about CCM team and working with the patient to optimize health and well being Discussed plans with patient for ongoing care management follow up and provided patient with direct contact information for care management team Advised patient to call the office for changes in conditions, questions, or concerns about health and well being Collaborated with RN Case Manager re: working with the pharm D to assist with medications reconciliation and ongoing education and support of medications.     Anemia/Bleeding( Thrombocytopenia)  (Status: New goal. Goal on track: NO.) Assessment of understanding of anemia/bleeding disorder diagnosis Basic overview and discussion of anemia/bleeding disorder or acute disease state Review of most recent labs:  Lab Results  Component Value Date   WBC 6.7 06/10/2021   HGB 16.2 (H) 06/10/2021   HCT 50.0 (H) 06/10/2021   MCV 98.2 06/10/2021   PLT 118 (L) 06/10/2021   Medications reviewed - Counseled on bleeding risk associated with thrombocytopenia and importance of self-monitoring for signs/symptoms of bleeding; - Counseled on avoidance of NSAIDs due to increased bleeding risk, extensive education on avoiding NSAIDS. The patient ask if taking aspirin and ibuprofen were okay to take. The patient advised to avoid aspirin, ibuprofen, advil, aleve, naproxen and other medications classified as NSAIDS. Advised the patient if she had to take something for a headache to use Tylenol and not advil or ibuprofen. Used teach back method to enforce the need to not use aspirin or NSAIDS. Will also consult the pharm D for assistance with education on avoiding ASA and NSAIDS.  - Counseled on importance of regular laboratory monitoring as directed by provider; - Provided education about signs and symptoms of active bleeding such as stomach discomfort, coughing up blood or blood tinged secretions, bleeding from the  gums/teeth, nosebleeds, increased bruising, blood in the urine/stool and/or if a traumatic injury occurs, regardless of severity of injury;  - Advised to call provider or 911 if active bleeding or signs and symptoms of active bleeding occur; - recommended promotion of rest and energy-conserving measures to manage fatigue, such as balancing activity with periods of rest - encouraged strategies to prevent falls related to fatigue, weakness and dizziness; encouraged sitting before standing and using an assistive device - encouraged optimal oral intake to support fluid balance and nutrition - encouraged dietary changes to increase dietary intake of iron, Vitamin B12 and folic acid as advised/prescribed Education provided from pcp notes that the pcp feels thrombocytopenia may be coming from medications she had been taken. Reviewed medication changes with the patient and she verbalized understanding.   Anxiety   (Status: New goal. Goal on track: YES.) Evaluation of current treatment plan related to Anxiety,  self-management and patient's adherence to plan as established by provider. Discussed plans with patient for ongoing care management follow up and provided patient with direct contact information for care management team Advised patient to call the office for changes in mood, anxiety, or depression ; Provided education to patient re: pacing activity, being safe in her environement, calling for changes, and being socially active; Reviewed medications with patient and discussed compliance ; Reviewed scheduled/upcoming provider appointments including 08-20-2021 at 0800 am; Discussed plans with patient for ongoing care management follow up and provided patient with direct contact information for care management team; Screening for signs and symptoms of depression related to chronic disease state;  Assessed social determinant of health barriers;   Hyperlipidemia:  (Status: New goal. Goal on track:  NO.) Lab Results  Component Value Date   CHOL 207 (H) 11/01/2018   HDL 52 11/01/2018   LDLCALC 132 (H) 11/01/2018   TRIG 124 11/01/2018   CHOLHDL 4.0 11/01/2018     Medication review performed; medication list updated in electronic medical record. [id number]: the patient had a change from simvastatin to atorvastatin 10 mg daily. The pcp feels simvastatin may be contributing to thrombocytopenia issue. Extensive review of medication changes with the patient for clarity and compliance. Also education on taking cholesterol medications with night time for it to be more beneficial for the patient.  Provider established cholesterol goals reviewed; Counseled on importance of regular laboratory monitoring as prescribed; Provided HLD educational materials; Reviewed role and benefits of statin for ASCVD risk reduction; Discussed strategies to manage statin-induced myalgias; Reviewed importance of limiting foods high in cholesterol 06-24-2021: The patient states she is compliant with a heart healthy/ADA diet Reviewed exercise goals and target of 150 minutes per week. 06-24-2021: Does not do any structured exercise. Education and support given.   Hypertension: (Status: New goal. Goal on track: YES.) Last practice recorded BP readings:  BP Readings from Last 3 Encounters:  06/24/21 (!) 159/59  06/10/21 (!) 151/68  05/21/21 (!) 162/78  Most recent eGFR/CrCl: No results found for: EGFR  No components found for: CRCL  Evaluation of current treatment plan related to hypertension self management and patient's adherence to plan as established by provider;   Provided education to patient re: stroke prevention, s/s of heart attack and stroke; Reviewed prescribed diet heart healthy/ADA diet  Reviewed medications with patient and discussed importance of compliance;  Discussed plans with patient for ongoing care management follow up and provided patient with direct contact information for care management  team; Advised patient, providing education and rationale, to monitor blood pressure daily and record, calling PCP for findings outside established parameters;  Reviewed scheduled/upcoming provider appointments including:  Provided education on prescribed diet heart healthy/ADA diet ;  Discussed complications of poorly controlled blood pressure such as heart disease, stroke, circulatory complications, vision complications, kidney impairment, sexual dysfunction;    Hypothyroidism  (Status: New goal. Goal on track: YES.) Evaluation of current treatment plan related to  hypothyroidism  ,  self-management and patient's adherence to plan as established by provider. Discussed plans with patient for ongoing care management follow up and provided patient with direct contact information for care management team Advised patient to call the office for changes in conditions, new concerns, or questions; Provided education to patient re: taking levothyroixine as prescribed. The patient though this was one of the medicaitons she was told to stop. The patient verbalized understanding of education provided and not to stop medication; Reviewed medications with patient and discussed compliance. The patient states she is compliant with medications. Able to use teach back method with RNCM and verbalized understanding of taking levothyroixine as prescribed. ; Collaborated with pharm D regarding medication reconciliation and ongoing support and education, spoke to pharm D today asking the pharm D to reach out to the patient for support and education in medication management; Pharmacy referral for medication reconciliation and ongoing support and education; Discussed plans with patient for ongoing care management follow up and provided patient with direct contact information for care management team;   Patient Goals/Self-Care Activities: Patient will self administer medications as prescribed as evidenced by self  report/primary caregiver report  Patient will attend all  scheduled provider appointments as evidenced by clinician review of documented attendance to scheduled appointments and patient/caregiver report Patient will call pharmacy for medication refills as evidenced by patient report and review of pharmacy fill history as appropriate Patient will attend church or other social activities as evidenced by patient report Patient will continue to perform ADL's independently as evidenced by patient/caregiver report Patient will continue to perform IADL's independently as evidenced by patient/caregiver report Patient will call provider office for new concerns or questions as evidenced by review of documented incoming telephone call notes and patient report Patient will work with BSW to address care coordination needs and will continue to work with the clinical team to address health care and disease management related needs as evidenced by documented adherence to scheduled care management/care coordination appointments       Plan:Telephone follow up appointment with care management team member scheduled for:  07-29-2021 at 1 pm  [place], MSN, [place] Mobile: [phone]

## 2021-06-24 NOTE — Assessment & Plan Note (Signed)
Recently changed Simvastatin to Atorvastatin Encourage low-fat diet Will repeat lipid panel in 2 months

## 2021-06-24 NOTE — Progress Notes (Signed)
Subjective:    Patient ID: [name], female    DOB: 1941/06/20, 80 y.o.   MRN: [id number]  HPI  Patient presents to clinic today to discuss her recent lab results.  Her platelets were 118.  It was felt that this was medication induced and she was advised to stop her Cetirizine and Simvastatin.  She was started on Atorvastatin in the place of Simvastatin.  She denies easy bruising or bleeding.  Review of Systems  Past Medical History:  Diagnosis Date   Allergy    Anemia    Anxiety    Cancer ([place])    skin cancer   Hyperlipidemia    Hypertension    Hypothyroidism    Sciatica    Sleep apnea    does not use cpap   Thyroid disease     Current Outpatient Medications  Medication Sig Dispense Refill   acetaminophen (TYLENOL) 500 MG tablet Take 500 mg by mouth every 6 (six) hours as needed for moderate pain.      amLODipine (NORVASC) 5 MG tablet Take 1 tablet (5 mg total) by mouth daily. 30 tablet 0   atorvastatin (LIPITOR) 10 MG tablet Take 1 tablet (10 mg total) by mouth daily. 90 tablet 0   cetirizine (ZYRTEC) 10 MG tablet Take 1 tablet (10 mg total) by mouth daily. 90 tablet 0   citalopram (CELEXA) 20 MG tablet Take 1 tablet (20 mg total) by mouth daily. 90 tablet 1   levothyroxine (SYNTHROID) 150 MCG tablet Take 1 tablet (150 mcg total) by mouth daily. 90 tablet 0   losartan (COZAAR) 100 MG tablet Take 1 tablet (100 mg total) by mouth daily. 90 tablet 1   metoprolol succinate (TOPROL-XL) 50 MG 24 hr tablet TAKE 1 TABLET(50 MG) BY MOUTH EVERY MORNING 90 tablet 0   Multiple Vitamin (MULTIVITAMIN) capsule Take 1 capsule by mouth daily.     rOPINIRole (REQUIP) 0.5 MG tablet Take 1 tablet (0.5 mg total) by mouth at bedtime. 90 tablet 0   traZODone (DESYREL) 50 MG tablet TAKE 1 TABLET(50 MG) BY MOUTH AT BEDTIME 90 tablet 0   No current facility-administered medications for this visit.    Allergies  Allergen Reactions   Codeine Nausea Only    Tolerates oxycodone    Morphine Nausea And Vomiting   Ace Inhibitors Cough   Adhesive [Tape] Other (See Comments) and Rash    whelps    Family History  Problem Relation Age of Onset   Breast cancer Paternal Aunt    Dementia Mother    Dementia Sister    Heart attack Brother    Diabetes Brother    Heart attack Son    Hypercalcemia Neg Hx     Social History   Socioeconomic History   Marital status: Married    Spouse name: Not on file   Number of children: Not on file   Years of education: Not on file   Highest education level: Not on file  Occupational History   Occupation: retired  Tobacco Use   Smoking status: Every Day    Packs/day: 0.50    Years: 40.00    Pack years: 20.00    Types: Cigarettes   Smokeless tobacco: Never  Vaping Use   Vaping Use: Never used  Substance and Sexual Activity   Alcohol use: No   Drug use: No   Sexual activity: Not Currently  Other Topics Concern   Not on file  Social History Narrative   Not  on file   Social Determinants of Health   Financial Resource Strain: Not on file  Food Insecurity: Not on file  Transportation Needs: Not on file  Physical Activity: Not on file  Stress: Not on file  Social Connections: Not on file  Intimate Partner Violence: Not on file     Constitutional: Denies fever, malaise, fatigue, headache or abrupt weight changes.  Respiratory: Denies difficulty breathing, shortness of breath, cough or sputum production.   Cardiovascular: Denies chest pain, chest tightness, palpitations or swelling in the hands or feet.  Skin: Denies easy bruising or bleeding.    No other specific complaints in a complete review of systems (except as listed in HPI above).     Objective:  BP (!) 159/59 (BP Location: Right Arm, Patient Position: Sitting, Cuff Size: Normal)   Pulse (!) 52   Temp (!) 97.3 F (36.3 C) (Temporal)   Wt 167 lb (75.8 kg)   LMP  (LMP Unknown)   SpO2 100%   BMI 29.58 kg/m   Wt Readings from Last 3 Encounters:   06/10/21 168 lb 6.4 oz (76.4 kg)  05/21/21 171 lb 12.8 oz (77.9 kg)  05/20/21 170 lb 12.8 oz (77.5 kg)    General: Appears her stated age, overweight, in NAD. Skin: Warm, dry and intact. No bruises noted. Cardiovascular: Bradycardic with normal rhythm. S1,S2 noted.  No murmur, rubs or gallops noted.  Pulmonary/Chest: Normal effort and positive vesicular breath sounds. No respiratory distress. No wheezes, rales or ronchi noted.  Neurological: Alert but noted difficulty with memory.   BMET    Component Value Date/Time   NA 142 05/20/2021 0929   NA 145 (H) 12/21/2017 1017   NA 139 08/26/2012 2035   K 4.2 05/20/2021 0929   K 3.3 (L) 08/26/2012 2035   CL 109 05/20/2021 0929   CL 105 08/26/2012 2035   CO2 29 05/20/2021 0929   CO2 29 08/26/2012 2035   GLUCOSE 89 05/20/2021 0929   GLUCOSE 97 08/26/2012 2035   BUN 14 05/20/2021 0929   BUN 13 12/21/2017 1017   BUN 13 08/26/2012 2035   CREATININE 0.75 05/20/2021 0929   CALCIUM 9.8 05/20/2021 0929   CALCIUM 9.9 08/26/2012 2035   GFRNONAA 66 09/11/2020 1147   GFRAA 76 09/11/2020 1147    Lipid Panel     Component Value Date/Time   CHOL 207 (H) 11/01/2018 0925   CHOL 181 12/21/2017 1017   TRIG 124 11/01/2018 0925   HDL 52 11/01/2018 0925   HDL 50 12/21/2017 1017   CHOLHDL 4.0 11/01/2018 0925   LDLCALC 132 (H) 11/01/2018 0925    CBC    Component Value Date/Time   WBC 6.7 06/10/2021 1032   RBC 5.09 06/10/2021 1032   HGB 16.2 (H) 06/10/2021 1032   HGB 14.8 12/21/2017 1017   HCT 50.0 (H) 06/10/2021 1032   HCT 42.5 12/21/2017 1017   PLT 118 (L) 06/10/2021 1032   PLT 141 (L) 12/21/2017 1017   MCV 98.2 06/10/2021 1032   MCV 93 12/21/2017 1017   MCV 92 08/26/2012 2035   MCH 31.8 06/10/2021 1032   MCHC 32.4 06/10/2021 1032   RDW 12.1 06/10/2021 1032   RDW 12.8 12/21/2017 1017   RDW 13.1 08/26/2012 2035   LYMPHSABS 2,379 06/10/2021 1032   LYMPHSABS 2.5 12/21/2017 1017   EOSABS 101 06/10/2021 1032   EOSABS 0.3 12/21/2017  1017   BASOSABS 67 06/10/2021 1032   BASOSABS 0.0 12/21/2017 1017    Hgb A1C  Lab Results  Component Value Date   HGBA1C 5.3 05/08/2021            Assessment & Plan:   Jocelyn Silversmith, NP This visit occurred during the SARS-CoV-2 public health emergency.  Safety protocols were in place, including screening questions prior to the visit, additional usage of staff PPE, and extensive cleaning of exam room while observing appropriate contact time as indicated for disinfecting solutions.

## 2021-06-24 NOTE — Patient Instructions (Signed)
Visit Information   PATIENT GOALS:   Goals Addressed             This Visit's Progress    RNCM: Enhance My Mental Skills       Timeframe:  Long-Range Goal Priority:  High Start Date:        06-24-2021                     Expected End Date:    06-24-2022                   Follow Up Date 07/29/2021    - check out a senior citizen activity program - do word search or crossword puzzles daily - learn a new hobby like knitting or woodworking - read 1 new book each month - stay in touch with my family and friends - take a walk daily and think about what I am seeing    Why is this important?   As we age, or sometimes because we have an illness, it feels like our memory and ability to figure things out is not very good.  There are things you can do to keep your memory and your thinking as strong as possible.    06-24-2021: The patient sometimes repeats self and is unsure of instructions given. Education and support given. The patient states that she forgets sometimes. Extensive review of medication changes and what thrombocytopenia is and what are some of the causes of thrombocytopenia are. Will continue to monitor for changes or needs.      RNCM: Manage My Medicine       Timeframe:  Long-Range Goal Priority:  High Start Date:          06-24-2021                   Expected End Date:     06-24-2022                  Follow Up Date 07/29/2021    - call for medicine refill 2 or 3 days before it runs out - call if I am sick and can't take my medicine - keep a list of all the medicines I take; vitamins and herbals too - learn to read medicine labels - use a pillbox to sort medicine - use an alarm clock or phone to remind me to take my medicine    Why is this important?   These steps will help you keep on track with your medicines.   06-24-2021: The patient states that she has her pills separated into day and night. Review of medication changes made at pcp office today to make sure the  patient knew exactly what medications were stopped and what medications were started. The patient also advised not to take aspirin or NSAIDS. She states she takes ibuprofen instead of tylenol. Education on Ibuprofen, advil, aleve, naproxen being NSAIDS and this causing more issues with thrombocytopenia and to take Tylenol instead of these other choices. The patient verbalized understanding. Will ask the pharm D to assist with medications management and ongoing support.      RNCM: Track and Manage My Blood Pressure-Hypertension       Timeframe:  Long-Range Goal Priority:  High Start Date:        06-24-2021                     Expected End Date:      06-24-2022  Follow Up Date 07/29/2021    - check blood pressure weekly - choose a place to take my blood pressure (home, clinic or office, retail store) - write blood pressure results in a log or diary    Why is this important?   You won't feel high blood pressure, but it can still hurt your blood vessels.  High blood pressure can cause heart or kidney problems. It can also cause a stroke.  Making lifestyle changes like losing a little weight or eating less salt will help.  Checking your blood pressure at home and at different times of the day can help to control blood pressure.  If the doctor prescribes medicine remember to take it the way the doctor ordered.  Call the office if you cannot afford the medicine or if there are questions about it.     06-24-2021: The patient states she can tell when her blood pressure is elevated. Denies any issues with high blood pressures. Does states sometimes her "head feels funny". Discussed changing position slowly and monitoring for dizziness that she could possibly have orthostatic hypotension. Explained what orthostatic hypotension was and to change position slowing, be cautious, be safe and monitor for potential fall risk.          CLINICAL CARE PLAN: Patient Care Plan: RNCM: General Plan  of Care (Adult)     Problem Identified: RNCM; Adult Plan of Care for Chronic Disease Management and Care Coordination Needs   Priority: High  Onset Date: 06/24/2021     Long-Range Goal: RNCM: Adult Plan of Care for Chronic Disease Management and Care Coordination Needs   Start Date: 06/24/2021  Expected End Date: 06/19/2022  This Visit's Progress: On track  Priority: High  Note:   Current Barriers:  Knowledge Deficits related to plan of care for management of HTN, HLD, Anxiety with Excessive Worry, Social Anxiety,, and thrombocytopenia and hypothyroidism  Chronic Disease Management support and education needs related to HTN, HLD, Anxiety with Excessive Worry, Social Anxiety,, and thrombocytopenia and hypothyroidism  Cognitive Deficits  RNCM Clinical Goal(s):  Patient will verbalize understanding of plan for management of HTN, HLD, Anxiety, and thrombocytopenia and hypothyroidism  verbalize basic understanding of HTN, HLD, Anxiety, and hypothyroidism and thrombocytopenia  disease process and self health management plan   take all medications exactly as prescribed and will call provider for medication related questions demonstrate understanding of rationale for each prescribed medication   attend all scheduled medical appointments: 08-20-2021 at 0800 am demonstrate improved and ongoing adherence to prescribed treatment plan for HTN, HLD, Anxiety, and thrombocytopenia and hypothyroidism  as evidenced by adherence to prescribed medication regimen contacting provider for new or worsened symptoms or questions   demonstrate improved and ongoing health management independence by taking medications as prescribed, calling the office for changes in conditions, questions, or new concerns continue to work with RN Care Manager to address care management and care coordination needs related to HTN, HLD, Anxiety, and thrombocytopenia, and hypothyroidism   work with pharmacist to address Cognitive Deficits  and medication reconciliation and ongoing support related to HTN, HLD, Anxiety, and thrombocytopenia, and hyporthyroidism demonstrate a decrease in HTN, HLD, Anxiety, and hypothyroidism and thrombocytopenia exacerbations   demonstrate ongoing self health care management ability    through collaboration with RN Care manager, provider, and care team.   Interventions: 1:1 collaboration with primary care provider regarding development and update of comprehensive plan of care as evidenced by provider attestation and co-signature Inter-disciplinary care team collaboration (see longitudinal  plan of care) Evaluation of current treatment plan related to  self management and patient's adherence to plan as established by provider   SDOH Barriers (Status: New goal. Goal on track: YES.)  Patient interviewed and SDOH assessment performed        SDOH Interventions    Flowsheet Row Most Recent Value  SDOH Interventions   Food Insecurity Interventions Intervention Not Indicated  Financial Strain Interventions Intervention Not Indicated  Intimate Partner Violence Interventions Intervention Not Indicated  Physical Activity Interventions Other (Comments)  [no structured activity]  Stress Interventions Intervention Not Indicated  Social Connections Interventions Other (Comment)  [good support system]  Transportation Interventions Intervention Not Indicated     Patient interviewed and appropriate assessments performed Provided patient with information about CCM team and working with the patient to optimize health and well being Discussed plans with patient for ongoing care management follow up and provided patient with direct contact information for care management team Advised patient to call the office for changes in conditions, questions, or concerns about health and well being Collaborated with RN Case Manager re: working with the pharm D to assist with medications reconciliation and ongoing education  and support of medications.     Anemia/Bleeding( Thrombocytopenia)  (Status: New goal. Goal on track: NO.) Assessment of understanding of anemia/bleeding disorder diagnosis Basic overview and discussion of anemia/bleeding disorder or acute disease state Review of most recent labs:  Lab Results  Component Value Date   WBC 6.7 06/10/2021   HGB 16.2 (H) 06/10/2021   HCT 50.0 (H) 06/10/2021   MCV 98.2 06/10/2021   PLT 118 (L) 06/10/2021   Medications reviewed - Counseled on bleeding risk associated with thrombocytopenia and importance of self-monitoring for signs/symptoms of bleeding; - Counseled on avoidance of NSAIDs due to increased bleeding risk, extensive education on avoiding NSAIDS. The patient ask if taking aspirin and ibuprofen were okay to take. The patient advised to avoid aspirin, ibuprofen, advil, aleve, naproxen and other medications classified as NSAIDS. Advised the patient if she had to take something for a headache to use Tylenol and not advil or ibuprofen. Used teach back method to enforce the need to not use aspirin or NSAIDS. Will also consult the pharm D for assistance with education on avoiding ASA and NSAIDS.  - Counseled on importance of regular laboratory monitoring as directed by provider; - Provided education about signs and symptoms of active bleeding such as stomach discomfort, coughing up blood or blood tinged secretions, bleeding from the gums/teeth, nosebleeds, increased bruising, blood in the urine/stool and/or if a traumatic injury occurs, regardless of severity of injury;  - Advised to call provider or 911 if active bleeding or signs and symptoms of active bleeding occur; - recommended promotion of rest and energy-conserving measures to manage fatigue, such as balancing activity with periods of rest - encouraged strategies to prevent falls related to fatigue, weakness and dizziness; encouraged sitting before standing and using an assistive device - encouraged  optimal oral intake to support fluid balance and nutrition - encouraged dietary changes to increase dietary intake of iron, Vitamin A19 and folic acid as advised/prescribed Education provided from pcp notes that the pcp feels thrombocytopenia may be coming from medications she had been taken. Reviewed medication changes with the patient and she verbalized understanding.   Anxiety   (Status: New goal. Goal on track: YES.) Evaluation of current treatment plan related to Anxiety,  self-management and patient's adherence to plan as established by provider. Discussed plans with patient for  ongoing care management follow up and provided patient with direct contact information for care management team Advised patient to call the office for changes in mood, anxiety, or depression ; Provided education to patient re: pacing activity, being safe in her environement, calling for changes, and being socially active; Reviewed medications with patient and discussed compliance ; Reviewed scheduled/upcoming provider appointments including 08-20-2021 at 0800 am; Discussed plans with patient for ongoing care management follow up and provided patient with direct contact information for care management team; Screening for signs and symptoms of depression related to chronic disease state;  Assessed social determinant of health barriers;   Hyperlipidemia:  (Status: New goal. Goal on track: NO.) Lab Results  Component Value Date   CHOL 207 (H) 11/01/2018   HDL 52 11/01/2018   LDLCALC 132 (H) 11/01/2018   TRIG 124 11/01/2018   CHOLHDL 4.0 11/01/2018     Medication review performed; medication list updated in electronic medical record. [id number]: the patient had a change from simvastatin to atorvastatin 10 mg daily. The pcp feels simvastatin may be contributing to thrombocytopenia issue. Extensive review of medication changes with the patient for clarity and compliance. Also education on taking cholesterol medications  with night time for it to be more beneficial for the patient.  Provider established cholesterol goals reviewed; Counseled on importance of regular laboratory monitoring as prescribed; Provided HLD educational materials; Reviewed role and benefits of statin for ASCVD risk reduction; Discussed strategies to manage statin-induced myalgias; Reviewed importance of limiting foods high in cholesterol 06-24-2021: The patient states she is compliant with a heart healthy/ADA diet Reviewed exercise goals and target of 150 minutes per week. 06-24-2021: Does not do any structured exercise. Education and support given.   Hypertension: (Status: New goal. Goal on track: YES.) Last practice recorded BP readings:  BP Readings from Last 3 Encounters:  06/24/21 (!) 159/59  06/10/21 (!) 151/68  05/21/21 (!) 162/78  Most recent eGFR/CrCl: No results found for: EGFR  No components found for: CRCL  Evaluation of current treatment plan related to hypertension self management and patient's adherence to plan as established by provider;   Provided education to patient re: stroke prevention, s/s of heart attack and stroke; Reviewed prescribed diet heart healthy/ADA diet  Reviewed medications with patient and discussed importance of compliance;  Discussed plans with patient for ongoing care management follow up and provided patient with direct contact information for care management team; Advised patient, providing education and rationale, to monitor blood pressure daily and record, calling PCP for findings outside established parameters;  Reviewed scheduled/upcoming provider appointments including:  Provided education on prescribed diet heart healthy/ADA diet ;  Discussed complications of poorly controlled blood pressure such as heart disease, stroke, circulatory complications, vision complications, kidney impairment, sexual dysfunction;    Hypothyroidism  (Status: New goal. Goal on track: YES.) Evaluation of current  treatment plan related to  hypothyroidism  ,  self-management and patient's adherence to plan as established by provider. Discussed plans with patient for ongoing care management follow up and provided patient with direct contact information for care management team Advised patient to call the office for changes in conditions, new concerns, or questions; Provided education to patient re: taking levothyroixine as prescribed. The patient though this was one of the medicaitons she was told to stop. The patient verbalized understanding of education provided and not to stop medication; Reviewed medications with patient and discussed compliance. The patient states she is compliant with medications. Able to use teach back method with  RNCM and verbalized understanding of taking levothyroixine as prescribed. ; Collaborated with pharm D regarding medication reconciliation and ongoing support and education, spoke to pharm D today asking the pharm D to reach out to the patient for support and education in medication management; Pharmacy referral for medication reconciliation and ongoing support and education; Discussed plans with patient for ongoing care management follow up and provided patient with direct contact information for care management team;   Patient Goals/Self-Care Activities: Patient will self administer medications as prescribed as evidenced by self report/primary caregiver report  Patient will attend all scheduled provider appointments as evidenced by clinician review of documented attendance to scheduled appointments and patient/caregiver report Patient will call pharmacy for medication refills as evidenced by patient report and review of pharmacy fill history as appropriate Patient will attend church or other social activities as evidenced by patient report Patient will continue to perform ADL's independently as evidenced by patient/caregiver report Patient will continue to perform IADL's  independently as evidenced by patient/caregiver report Patient will call provider office for new concerns or questions as evidenced by review of documented incoming telephone call notes and patient report Patient will work with BSW to address care coordination needs and will continue to work with the clinical team to address health care and disease management related needs as evidenced by documented adherence to scheduled care management/care coordination appointments       Consent to CCM Services: Ms. Goetzke was given information about Chronic Care Management services including:  CCM service includes personalized support from designated clinical staff supervised by her physician, including individualized plan of care and coordination with other care providers 24/7 contact phone numbers for assistance for urgent and routine care needs. Service will only be billed when office clinical staff spend 20 minutes or more in a month to coordinate care. Only one practitioner may furnish and bill the service in a calendar month. The patient may stop CCM services at any time (effective at the end of the month) by phone call to the office staff. The patient will be responsible for cost sharing (co-pay) of up to 20% of the service fee (after annual deductible is met).  Patient agreed to services and verbal consent obtained.   Patient verbalizes understanding of instructions provided today and agrees to view in [place].   Telephone follow up appointment with care management team member scheduled for: 07-29-2021 at 1 pm  Noreene Larsson RN, MSN, [place] Mobile: [phone]

## 2021-06-24 NOTE — Assessment & Plan Note (Signed)
Discussed concern that this is likely medication induced We have stopped her Cetirizine and changed her Simvastatin to Atorvastatin We will repeat CBC in 2 months

## 2021-06-24 NOTE — Patient Instructions (Signed)
Thrombocytopenia Thrombocytopenia means that you have a low number of platelets in your blood. Platelets are tiny cells in the blood. When you bleed, they clump together at the cut or injury to stop the bleeding. This is called blood clotting. If you do not have enough platelets, it can cause bleeding problems. Some cases of this condition are mild while others are more severe. What are the causes? This condition may be caused by: Your body not making enough platelets. This may be caused by: Your bone marrow not making blood cells (aplastic anemia). Cancer in the bone marrow. Certain medicines. Infection in the bone marrow. Drinking a lot of alcohol. Your body destroying platelets too quickly. This may be caused by: Certain immune diseases. Certain medicines. Certain blood clotting disorders. Certain disorders that are passed from parent to child (inherited). Certain bleeding disorders. Pregnancy. Having a spleen that is larger than normal. What are the signs or symptoms? Bleeding that is not normal. Nosebleeds. Heavy menstrual periods. Blood in the pee (urine) or poop (stool). A purple-like color to the skin (purpura). Bruising. A rash that looks like pinpoint, purple-red spots (petechiae). How is this treated? Treatment of another condition that is causing the low platelet count. Medicines to help protect your platelets from being destroyed. A replacement (transfusion) of platelets to stop or prevent bleeding. Surgery to remove the spleen. Follow these instructions at home: Activity Avoid activities that could cause you to get hurt or bruised. Follow instructions about how to prevent falls. Take care not to cut yourself: When you shave. When you use scissors, needles, knives, or other tools. Take care not to burn yourself: When you use an iron. When you cook. General instructions  Check your skin and the inside of your mouth for bruises or blood as told by your  doctor. Check to see if there is blood in your spit (sputum), pee, and poop. Do this as told by your doctor. Do not drink alcohol. Take over-the-counter and prescription medicines only as told by your doctor. Do not take any medicines that have aspirin or NSAIDs in them. These medicines can thin your blood and cause you to bleed. Tell all of your doctors that you have this condition. Be sure to tell your dentist and eye doctor too. Contact a doctor if: You have bruises and you do not know why. Get help right away if: You are bleeding anywhere on your body. You have blood in your spit, pee, or poop. Summary Thrombocytopenia means that you have a low number of platelets in your blood. Platelets are needed for blood clotting. Symptoms of this condition include bleeding that is not normal, and bruising. Take care not to cut or burn yourself. This information is not intended to replace advice given to you by your health care provider. Make sure you discuss any questions you have with your health care provider. Document Revised: 12/18/2020 Document Reviewed: 06/10/2018 Elsevier Patient Education  [place].

## 2021-06-27 ENCOUNTER — Telehealth: Payer: Self-pay

## 2021-06-27 NOTE — Progress Notes (Signed)
Pt has been scheduled.  °

## 2021-06-27 NOTE — Chronic Care Management (AMB) (Signed)
  Chronic Care Management   Note  06/27/2021 Name: [name] MRN: [id number] DOB: 07-25-1941  [name] is a 80 y.o. year old female who is a primary care patient of [name], NP. [name] is currently enrolled in care management services. An additional referral for Pharm D  was placed.   Follow up plan: Telephone appointment with care management team member scheduled for:07/01/2021  Noreene Larsson, [address], [place], [place] [address] Direct Dial: [phone] [email or web address]

## 2021-06-29 ENCOUNTER — Other Ambulatory Visit: Payer: Self-pay | Admitting: Internal Medicine

## 2021-06-29 NOTE — Telephone Encounter (Signed)
Requested Prescriptions  Pending Prescriptions Disp Refills  . amLODipine (NORVASC) 5 MG tablet [Pharmacy Med Name: AMLODIPINE BESYLATE 5MG  TABLETS] 30 tablet 0    Sig: TAKE 1 TABLET(5 MG) BY MOUTH DAILY     Cardiovascular:  Calcium Channel Blockers Failed - 06/29/2021  5:49 PM      Failed - Last BP in normal range    BP Readings from Last 1 Encounters:  06/24/21 (!) 159/59         Passed - Valid encounter within last 6 months    Recent Outpatient Visits          5 days ago Aortic atherosclerosis ([place])   [facility] [place], Coralie Keens, NP   2 weeks ago Thrombocytopenia [facility])   [facility], Coralie Keens, NP   1 month ago Primary hypertension   [place], Coralie Keens, NP   1 month ago Feeling jittery   [facility], Coralie Keens, NP   2 months ago Essential hypertension   [place], Coralie Keens, NP      Future Appointments            In 1 month [place], Coralie Keens, NP [facility], [place]   In [place], Coralie Keens, NP [facility], [facility]

## 2021-06-30 ENCOUNTER — Other Ambulatory Visit: Payer: Self-pay | Admitting: Internal Medicine

## 2021-06-30 NOTE — Telephone Encounter (Signed)
last RF 06/29/21 #30

## 2021-07-01 ENCOUNTER — Ambulatory Visit: Payer: PPO | Admitting: Pharmacist

## 2021-07-01 DIAGNOSIS — I10 Essential (primary) hypertension: Secondary | ICD-10-CM

## 2021-07-01 DIAGNOSIS — E7800 Pure hypercholesterolemia, unspecified: Secondary | ICD-10-CM

## 2021-07-01 DIAGNOSIS — E039 Hypothyroidism, unspecified: Secondary | ICD-10-CM

## 2021-07-01 NOTE — Chronic Care Management (AMB) (Signed)
Chronic Care Management Pharmacy Note  07/01/2021 Name:  [name] MRN:  [id number] DOB:  July 08, 1941  Subjective: [name] is an 80 y.o. year old female who is a primary patient of [name], NP.  The CCM team was consulted for assistance with disease management and care coordination needs.    Engaged with patient by telephone for initial visit in response to provider referral for pharmacy case management and/or care coordination services.   Consent to Services:  The patient was given information about Chronic Care Management services, agreed to services, and gave verbal consent prior to initiation of services.  Please see initial visit note for detailed documentation.   Patient Care Team: [name], NP as PCP - General (Internal Medicine) Vanita Ingles, RN as Registered Nurse (General Practice) Curley Spice Virl Diamond, RPH-CPP as Pharmacist  Recent office visits: Office Visit with PCP on [facility] visits: None in previous 6 months  Objective:  Lab Results  Component Value Date   CREATININE 0.75 05/20/2021   CREATININE 0.74 01/29/2021   CREATININE 0.70 12/05/2020       Component Value Date/Time   CHOL 207 (H) 11/01/2018 0925   CHOL 181 12/21/2017 1017   TRIG 124 11/01/2018 0925   HDL 52 11/01/2018 0925   HDL 50 12/21/2017 1017   CHOLHDL 4.0 11/01/2018 0925   LDLCALC 132 (H) 11/01/2018 0925    Hepatic Function Latest Ref Rng & Units 01/29/2021 12/05/2020 09/11/2020  Total Protein 6.1 - 8.1 g/dL 7.0 6.6 7.3  Albumin 3.5 - 4.8 g/dL - - -  AST 10 - 35 U/L $Remo'16 14 15  'SgmKT$ ALT 6 - 29 U/L $Remo'18 8 8  'RelkD$ Alk Phosphatase 39 - 117 IU/L - - -  Total Bilirubin 0.2 - 1.2 mg/dL 0.7 0.9 0.8    Lab Results  Component Value Date/Time   TSH 1.31 05/20/2021 09:29 AM   TSH 0.41 01/29/2021 10:28 AM   FREET4 1.3 05/20/2021 09:29 AM   FREET4 1.5 01/29/2021 10:28 AM     Social History   Tobacco Use  Smoking Status Every Day   Packs/day: 0.50   Years:  40.00   Pack years: 20.00   Types: Cigarettes  Smokeless Tobacco Never   BP Readings from Last 3 Encounters:  06/24/21 (!) 159/59  06/10/21 (!) 151/68  05/21/21 (!) 162/78   Pulse Readings from Last 3 Encounters:  06/24/21 (!) 52  06/10/21 60  05/21/21 (!) 57   Wt Readings from Last 3 Encounters:  06/24/21 167 lb (75.8 kg)  06/10/21 168 lb 6.4 oz (76.4 kg)  05/21/21 171 lb 12.8 oz (77.9 kg)    Assessment: Review of patient past medical history, allergies, medications, health status, including review of consultants reports, laboratory and other test data, was performed as part of comprehensive evaluation and provision of chronic care management services.   SDOH:  (Social Determinants of Health) assessments and interventions performed:    CCM Care Plan  Allergies  Allergen Reactions   Codeine Nausea Only    Tolerates oxycodone   Morphine Nausea And Vomiting   Ace Inhibitors Cough   Adhesive [Tape] Other (See Comments) and Rash    whelps    Medications Reviewed Today     Reviewed by Rennis Petty, RPH-CPP (Pharmacist) on 07/01/21 at 1250  Med List Status: <None>   Medication Order Taking? Sig Documenting Provider Last Dose Status Informant  acetaminophen (TYLENOL) 500 MG tablet [id number] Yes Take 500 mg  by mouth every 6 (six) hours as needed for moderate pain.  [provider] Taking Active Self  amLODipine (NORVASC) 5 MG tablet [id number] Yes TAKE 1 TABLET(5 MG) BY MOUTH DAILY Baity, Coralie Keens, NP Taking Active   atorvastatin (LIPITOR) 10 MG tablet [id number] Yes Take 1 tablet (10 mg total) by mouth daily. [name], NP Taking Active   citalopram (CELEXA) 20 MG tablet [id number] Yes Take 1 tablet (20 mg total) by mouth daily. [name], NP Taking Active   levothyroxine (SYNTHROID) 150 MCG tablet [id number] Yes Take 1 tablet (150 mcg total) by mouth daily. [name], NP Taking Active   losartan (COZAAR) 100 MG tablet [id number]  Take 1 tablet  (100 mg total) by mouth daily. [name], NP  Active   metoprolol succinate (TOPROL-XL) 50 MG 24 hr tablet [id number] Yes TAKE 1 TABLET(50 MG) BY MOUTH EVERY MORNING Karamalegos, Devonne Doughty, DO Taking Active   Multiple Vitamin (MULTIVITAMIN) capsule [id number] Yes Take 1 capsule by mouth daily. [provider] Taking Active   rOPINIRole (REQUIP) 0.5 MG tablet [id number] Yes Take 1 tablet (0.5 mg total) by mouth at bedtime. [name], NP Taking Active   traZODone (DESYREL) 50 MG tablet [id number] Yes TAKE 1 TABLET(50 MG) BY MOUTH AT BEDTIME [name], NP Taking Active             Patient Active Problem List   Diagnosis Date Noted   Thrombocytopenia ([place]) 06/10/2021   Cognitive impairment 06/10/2021   Aortic atherosclerosis ([place]) 05/08/2021   Overweight with body mass index (BMI) of 29 to 29.9 in adult 03/18/2021   RLS (restless legs syndrome) 01/29/2021   Obstructive chronic bronchitis without exacerbation ([place]) 01/29/2021   Hyperlipidemia 07/09/2020   Osteopenia of left femoral neck 06/09/2019   Hyperparathyroidism ([place]) 04/15/2019   Acquired hypothyroidism 08/05/2018   Anxiety 08/04/2018   Psychophysiological insomnia 08/04/2018   Osteoarthritis 04/14/2018   Hypertension 11/23/2017    Immunization History  Administered Date(s) Administered   Fluad Quad(high Dose 65+) 06/06/2019, 06/10/2021   Influenza, High Dose Seasonal PF 06/04/2017, 06/29/2018   Influenza-Unspecified 04/22/2020   PFIZER(Purple Top)SARS-COV-2 Vaccination 10/31/2019, 11/28/2019, 07/03/2020   Pneumococcal Conjugate-13 08/05/2018   Pneumococcal Polysaccharide-23 06/04/2017, 06/08/2020    Conditions to be addressed/monitored: HTN and HLD, hypothyroidism  Care Plan : PharmD - Medication Management; HTN  Updates made by Rennis Petty, RPH-CPP since 07/01/2021 12:00 AM     Problem: Disease Progression      Long-Range Goal: Disease Progression Prevented or Minimized   Start  Date: 07/01/2021  Expected End Date: 09/29/2021  This Visit's Progress: On track  Priority: High  Note:   Current Barriers:  Unable to achieve control of hypertension   Pharmacist Clinical Goal(s):  patient will achieve adherence to monitoring guidelines and medication adherence to achieve therapeutic efficacy achieve control of hypertension as evidenced by blood pressure readings  through collaboration with PharmD and provider.    Interventions: 1:1 collaboration with [name], NP regarding development and update of comprehensive plan of care as evidenced by provider attestation and co-signature Inter-disciplinary care team collaboration (see longitudinal plan of care) Receive referral from CCM Nurse Case Manager requesting CCM Pharmacist outreach to patient for medication adherence and disease state education Perform chart review. Office Visit with PCP on 10/3 for follow up Provider had advise patient to stop cetirizine and simvastatin due to concern that these medications may have led to thrombocytopenia. Patient advised to: start atorvastatin 10  mg daily in place of simvastatin Return for repeat CBC and lipid panel/follow up in 2 months Scheduled for 11/29 Comprehensive medication review performed; medication list updated in electronic medical record Denies taking any over the counter NSAIDs or aspirin Confirms no longer taking either cetirizine or simvastatin, as directed by PCP Today reports injured a toe on her left foot on Thursday (knocked with the vacuum)  Denies need to have injury seen by provider Denies any bruising or bleeding.  Discuss with patient the importance of monitoring for signs of bleeding or bruising with thrombocytopenia. Patient states she is aware to avoid activities that increase risk of bleeding and to contact office if bruising or bleeding  Hypertension:  New goal. Uncontrolled based on latest in office readings; current treatment: Amlodipine 5 mg  daily Metoprolol ER 50 mg QAM Losartan 100 mg daily, BUT identify has been taking losartan 50 mg daily From review of chart, note on 7/5, patient advised by office to increase losartan dose from 50 mg to 100 mg daily Today patient locates bottle of losartan 100 mg while on the phone. Reports will discard bottle of losartan 50 mg and start taking losartan 100 mg daily as directed by PCP From review of dispensing history, note Rx for losartan 50 mg refilled on 8/22 (after patient had been switched to losartan 100 mg daily) [place] today. Request losartan 50 mg Rx be discontinued Reports has home upper arm BP monitor, but denies checking recently Denies hypertensive symptoms Reports rarely has dizziness when quickly stands from sitting Counsel on taking positional changes slowly Denies any swelling in legs Counsel patient on home BP monitoring technique Counsel patient to start monitoring home BP, to keep a log of the results, have this record for [place] to review during telephone appointment in 1 week, but to call office sooner or readings outside of established parameter or for new/worsening symptoms   Hypothyroidism: New goal Controlled; current treatment: levothyroxine 150 mcg QAM Confirms takes levothyroxine each morning 30 minutes prior to breakfast  Hyperlipidemia: New goal. Current treatment: atorvastatin 10 mg daily Patient scheduled for next lipid panel in November   Patient Goals/Self-Care Activities patient will:  - take medications as prescribed - check blood pressure, document, and provide at future appointments  Follow Up Plan: Telephone follow up appointment with care management team member scheduled for: 10/17 at 12:30 pm       Patient's preferred pharmacy is:  [facility] [place], [address] [place] [address] Phone: [phone] Fax: [phone]  Follow Up:   Patient agrees to Care Plan and Follow-up.  Wallace Cullens, PharmD, [place], CPP Clinical Pharmacist [facility] [phone]

## 2021-07-01 NOTE — Patient Instructions (Signed)
Visit Information   PATIENT GOALS:   Goals Addressed             This Visit's Progress    Pharmacy Goals       Please check your home blood pressure, keep a log of the results and bring this with you to your medical appointments.  Feel free to call me with any questions or concerns. I look forward to our next call!  Wallace Cullens, PharmD, Para [place], CPP Clinical Pharmacist [place] [phone]        Consent to CCM Services: Ms. Nazzaro was given information about Chronic Care Management services including:  CCM service includes personalized support from designated clinical staff supervised by her physician, including individualized plan of care and coordination with other care providers 24/7 contact phone numbers for assistance for urgent and routine care needs. Service will only be billed when office clinical staff spend 20 minutes or more in a month to coordinate care. Only one practitioner may furnish and bill the service in a calendar month. The patient may stop CCM services at any time (effective at the end of the month) by phone call to the office staff. The patient will be responsible for cost sharing (co-pay) of up to 20% of the service fee (after annual deductible is met).  Patient agreed to services and verbal consent obtained.   The patient verbalized understanding of instructions, educational materials, and care plan provided today and declined offer to receive copy of patient instructions, educational materials, and care plan.   Telephone follow up appointment with care management team member scheduled for: 10/17 at 12:30 pm  CLINICAL CARE PLAN:   Patient Care Plan: PharmD - Medication Management; HTN     Problem Identified: Disease Progression      Long-Range Goal: Disease Progression Prevented or Minimized   Start Date: 07/01/2021  Expected End Date: 09/29/2021  This Visit's Progress: On track  Priority: High  Note:    Current Barriers:  Unable to achieve control of hypertension   Pharmacist Clinical Goal(s):  patient will achieve adherence to monitoring guidelines and medication adherence to achieve therapeutic efficacy achieve control of hypertension as evidenced by blood pressure readings  through collaboration with PharmD and provider.    Interventions: 1:1 collaboration with [name], NP regarding development and update of comprehensive plan of care as evidenced by provider attestation and co-signature Inter-disciplinary care team collaboration (see longitudinal plan of care) Receive referral from CCM Nurse Case Manager requesting CCM Pharmacist outreach to patient for medication adherence and disease state education Perform chart review. Office Visit with PCP on 10/3 for follow up Provider had advise patient to stop cetirizine and simvastatin due to concern that these medications may have led to thrombocytopenia. Patient advised to: start atorvastatin 10 mg daily in place of simvastatin Return for repeat CBC and lipid panel/follow up in 2 months Scheduled for 11/29 Comprehensive medication review performed; medication list updated in electronic medical record Denies taking any over the counter NSAIDs or aspirin Confirms no longer taking either cetirizine or simvastatin, as directed by PCP Today reports injured a toe on her left foot on Thursday (knocked with the vacuum)  Denies need to have injury seen by provider Denies any bruising or bleeding.  Discuss with patient the importance of monitoring for signs of bleeding or bruising with thrombocytopenia. Patient states she is aware to avoid activities that increase risk of bleeding and to contact office if bruising or bleeding  Hypertension:  New goal. Uncontrolled based on latest in office readings; current treatment: Amlodipine 5 mg daily Metoprolol ER 50 mg QAM Losartan 100 mg daily, BUT identify has been taking losartan 50 mg  daily From review of chart, note on 7/5, patient advised by office to increase losartan dose from 50 mg to 100 mg daily Today patient locates bottle of losartan 100 mg while on the phone. Reports will discard bottle of losartan 50 mg and start taking losartan 100 mg daily as directed by PCP From review of dispensing history, note Rx for losartan 50 mg refilled on 8/22 (after patient had been switched to losartan 100 mg daily) [place] today. Request losartan 50 mg Rx be discontinued Reports has home upper arm BP monitor, but denies checking recently Denies hypertensive symptoms Reports rarely has dizziness when quickly stands from sitting Counsel on taking positional changes slowly Denies any swelling in legs Counsel patient on home BP monitoring technique Counsel patient to start monitoring home BP, to keep a log of the results, have this record for [place] to review during telephone appointment in 1 week, but to call office sooner or readings outside of established parameter or for new/worsening symptoms   Hypothyroidism: New goal Controlled; current treatment: levothyroxine 150 mcg QAM Confirms takes levothyroxine each morning 30 minutes prior to breakfast  Hyperlipidemia: New goal. Current treatment: atorvastatin 10 mg daily Patient scheduled for next lipid panel in November   Patient Goals/Self-Care Activities patient will:  - take medications as prescribed - check blood pressure, document, and provide at future appointments  Follow Up Plan: Telephone follow up appointment with care management team member scheduled for: 10/17 at 12:30 pm

## 2021-07-08 ENCOUNTER — Ambulatory Visit: Payer: PPO | Admitting: Pharmacist

## 2021-07-08 DIAGNOSIS — I10 Essential (primary) hypertension: Secondary | ICD-10-CM

## 2021-07-08 NOTE — Patient Instructions (Signed)
Visit Information  PATIENT GOALS:  Goals Addressed             This Visit's Progress    Pharmacy Goals       Please check your home blood pressure, keep a log of the results and bring this with you to your medical appointments.  Feel free to call me with any questions or concerns. I look forward to our next call!    Wallace Cullens, PharmD, Para [place], CPP Clinical Pharmacist [facility] [phone]        The patient verbalized understanding of instructions, educational materials, and care plan provided today and declined offer to receive copy of patient instructions, educational materials, and care plan.   Telephone follow up appointment with care management team member scheduled for: 10/24 at 1:15 pm

## 2021-07-08 NOTE — Chronic Care Management (AMB) (Signed)
Chronic Care Management Pharmacy Note  07/08/2021 Name:  [name] MRN:  [id number] DOB:  30-Jun-1941   Subjective: [name] is an 80 y.o. year old female who is a primary patient of [name], NP.  The CCM team was consulted for assistance with disease management and care coordination needs.    Engaged with patient by telephone for follow up visit in response to provider referral for pharmacy case management and/or care coordination services.   Consent to Services:  The patient was given information about Chronic Care Management services, agreed to services, and gave verbal consent prior to initiation of services.  Please see initial visit note for detailed documentation.   Patient Care Team: [name], NP as PCP - General (Internal Medicine) Vanita Ingles, RN as Registered Nurse ([place]) Rennis Petty, [place] as Pharmacist  [facility] visits: None in previous 6 months  Objective:  Lab Results  Component Value Date   CREATININE 0.75 05/20/2021   CREATININE 0.74 01/29/2021   CREATININE 0.70 12/05/2020       Component Value Date/Time   CHOL 207 (H) 11/01/2018 0925   CHOL 181 12/21/2017 1017   TRIG 124 11/01/2018 0925   HDL 52 11/01/2018 0925   HDL 50 12/21/2017 1017   CHOLHDL 4.0 11/01/2018 0925   LDLCALC 132 (H) 11/01/2018 0925    Hepatic Function Latest Ref Rng & Units 01/29/2021 12/05/2020 09/11/2020  Total Protein 6.1 - 8.1 g/dL 7.0 6.6 7.3  Albumin 3.5 - 4.8 g/dL - - -  AST 10 - 35 U/L _0 ALT 6 - 29 U/L _1 Alk Phosphatase 39 - 117 IU/L - - -  Total Bilirubin 0.2 - 1.2 mg/dL 0.7 0.9 0.8    Lab Results  Component Value Date/Time   TSH 1.31 05/20/2021 09:29 AM   TSH 0.41 01/29/2021 10:28 AM   FREET4 1.3 05/20/2021 09:29 AM   FREET4 1.5 01/29/2021 10:28 AM    Social History   Tobacco Use  Smoking Status Every Day   Packs/day: 0.50   Years: 40.00   Pack years: 20.00   Types: Cigarettes   Smokeless Tobacco Never   BP Readings from Last 3 Encounters:  06/24/21 (!) 159/59  06/10/21 (!) 151/68  05/21/21 (!) 162/78   Pulse Readings from Last 3 Encounters:  06/24/21 (!) 52  06/10/21 60  05/21/21 (!) 57   Wt Readings from Last 3 Encounters:  06/24/21 167 lb (75.8 kg)  06/10/21 168 lb 6.4 oz (76.4 kg)  05/21/21 171 lb 12.8 oz (77.9 kg)    Assessment: Review of patient past medical history, allergies, medications, health status, including review of consultants reports, laboratory and other test data, was performed as part of comprehensive evaluation and provision of chronic care management services.   SDOH:  (Social Determinants of Health) assessments and interventions performed:    CCM Care Plan  Allergies  Allergen Reactions   Codeine Nausea Only    Tolerates oxycodone   Morphine Nausea And Vomiting   Ace Inhibitors Cough   Adhesive [Tape] Other (See Comments) and Rash    whelps    Medications Reviewed Today     Reviewed by Rennis Petty, RPH-CPP (Pharmacist) on 07/01/21 at 1250  Med List Status: <None>   Medication Order Taking? Sig Documenting Provider Last Dose Status Informant  acetaminophen (TYLENOL) 500 MG tablet [id number] Yes Take 500 mg by mouth every 6 (six) hours as needed for  moderate pain.  [provider] Taking Active Self  amLODipine (NORVASC) 5 MG tablet [id number] Yes TAKE 1 TABLET(5 MG) BY MOUTH DAILY Baity, Coralie Keens, NP Taking Active   atorvastatin (LIPITOR) 10 MG tablet [id number] Yes Take 1 tablet (10 mg total) by mouth daily. [name], NP Taking Active   citalopram (CELEXA) 20 MG tablet [id number] Yes Take 1 tablet (20 mg total) by mouth daily. [name], NP Taking Active   levothyroxine (SYNTHROID) 150 MCG tablet [id number] Yes Take 1 tablet (150 mcg total) by mouth daily. [name], NP Taking Active   losartan (COZAAR) 100 MG tablet [id number]  Take 1 tablet (100 mg total) by mouth daily. [name], NP   Active   metoprolol succinate (TOPROL-XL) 50 MG 24 hr tablet [id number] Yes TAKE 1 TABLET(50 MG) BY MOUTH EVERY MORNING Karamalegos, Devonne Doughty, DO Taking Active   Multiple Vitamin (MULTIVITAMIN) capsule [id number] Yes Take 1 capsule by mouth daily. [provider] Taking Active   rOPINIRole (REQUIP) 0.5 MG tablet [id number] Yes Take 1 tablet (0.5 mg total) by mouth at bedtime. [name], NP Taking Active   traZODone (DESYREL) 50 MG tablet [id number] Yes TAKE 1 TABLET(50 MG) BY MOUTH AT BEDTIME [name], NP Taking Active             Patient Active Problem List   Diagnosis Date Noted   Thrombocytopenia ([place]) 06/10/2021   Cognitive impairment 06/10/2021   Aortic atherosclerosis ([place]) 05/08/2021   Overweight with body mass index (BMI) of 29 to 29.9 in adult 03/18/2021   RLS (restless legs syndrome) 01/29/2021   Obstructive chronic bronchitis without exacerbation ([place]) 01/29/2021   Hyperlipidemia 07/09/2020   Osteopenia of left femoral neck 06/09/2019   Hyperparathyroidism ([place]) 04/15/2019   Acquired hypothyroidism 08/05/2018   Anxiety 08/04/2018   Psychophysiological insomnia 08/04/2018   Osteoarthritis 04/14/2018   Hypertension 11/23/2017    Immunization History  Administered Date(s) Administered   Fluad Quad(high Dose 65+) 06/06/2019, 06/10/2021   Influenza, High Dose Seasonal PF 06/04/2017, 06/29/2018   Influenza-Unspecified 04/22/2020   PFIZER(Purple Top)SARS-COV-2 Vaccination 10/31/2019, 11/28/2019, 07/03/2020   Pneumococcal Conjugate-13 08/05/2018   Pneumococcal Polysaccharide-23 06/04/2017, 06/08/2020    Conditions to be addressed/monitored: HTN and HLD, hypothyroidism  Care Plan : PharmD - Medication Management; HTN  Updates made by Rennis Petty, RPH-CPP since 07/08/2021 12:00 AM     Problem: Disease Progression      Long-Range Goal: Disease Progression Prevented or Minimized   Start Date: 07/01/2021  Expected End Date: 09/29/2021   This Visit's Progress: On track  Recent Progress: On track  Priority: High  Note:   Current Barriers:  Unable to achieve control of hypertension   Pharmacist Clinical Goal(s):  patient will achieve adherence to monitoring guidelines and medication adherence to achieve therapeutic efficacy achieve control of hypertension as evidenced by blood pressure readings  through collaboration with PharmD and provider.    Interventions: 1:1 collaboration with [name], NP regarding development and update of comprehensive plan of care as evidenced by provider attestation and co-signature Inter-disciplinary care team collaboration (see longitudinal plan of care)  Hypertension:  Goal on track: YES. Uncontrolled based on latest in office readings; current treatment: Amlodipine 5 mg daily Metoprolol ER 50 mg QAM Losartan 100 mg daily Identify patient recently picked up bottles of both amlodipine 5 mg and amlodipine 10 mg from the pharmacy. Patient locates bottle of amlodipine 10 mg while on the phone. Reports will discard bottle  of amlodipine 10 mg and continue to take amlodipine 5 mg daily as directed by PCP Patient reviews weekly pillbox and confirms has filled pillbox to take amlodipine 5 mg once daily and that amlodipine 10 mg tablets are not in pillboxes From review of dispensing history in chart, note [place] filled amlodipine 5 mg Rx on 10/9 and amlodipine 10 mg Rx on 10/10 (refilled from older Rx) [place] today. [facility] RPh states amlodipine 10 mg Rx will be discontinued Patient has home upper arm BP monitor, but reports forgot to check this week Patient places BP monitor on breakfast table to help with remembering to monitor Denies hypertensive symptoms Reports rarely has dizziness when quickly stands from sitting Counsel on taking positional changes slowly Counsel patient on home BP monitoring technique Counsel patient to start monitoring home BP, to  keep a log of the results, have this record for [place] to review during telephone appointment in 1 week, but to call office sooner or readings outside of established parameter or for new/worsening symptoms  Medication Adherence: New goal.  Patient uses weekly pillbox to organize her medications Reports rarely misses a dose Patient denies interest in using pill packaging Encourage patient to continue to use weekly pillbox. Advise patient to also use medication list from latest office visit with PCP when fills pillbox to confirm medications and dosages  Hypothyroidism: Condition stable. Not addressed this visit.  Controlled; current treatment: levothyroxine 150 mcg QAM Confirms takes levothyroxine each morning 30 minutes prior to breakfast  Hyperlipidemia: Not addresses this visit. Due for lab work at upcoming PCP appointment Current treatment: atorvastatin 10 mg daily Patient scheduled for next lipid panel in November   Patient Goals/Self-Care Activities patient will:  - take medications as prescribed - check blood pressure, document, and provide at future appointments  Follow Up Plan: Telephone follow up appointment with care management team member scheduled for: 10/24 at 1:15 pm      Patient's preferred pharmacy is:  [facility] [place], [place] [address] Phone: [phone] Fax: [phone]   Follow Up:  Patient agrees to Care Plan and Follow-up.  Wallace Cullens, PharmD, [place], CPP Clinical Pharmacist [facility] [phone]

## 2021-07-15 ENCOUNTER — Telehealth: Payer: Self-pay | Admitting: Pharmacist

## 2021-07-15 ENCOUNTER — Telehealth: Payer: PPO

## 2021-07-15 NOTE — Telephone Encounter (Signed)
  Chronic Care Management   Outreach Note  07/15/2021 Name: [name] MRN: [id number] DOB: Dec 07, 1940  Referred by: [name], NP Reason for referral : No chief complaint on file.   Was unable to reach patient via telephone today and have left HIPAA compliant voicemail asking patient to return my call.    Follow Up Plan: Will collaborate with Care Guide to outreach to schedule follow up with me  Wallace Cullens, PharmD, [place] Management [phone]

## 2021-07-22 DIAGNOSIS — E039 Hypothyroidism, unspecified: Secondary | ICD-10-CM

## 2021-07-22 DIAGNOSIS — I10 Essential (primary) hypertension: Secondary | ICD-10-CM | POA: Diagnosis not present

## 2021-07-22 DIAGNOSIS — E7800 Pure hypercholesterolemia, unspecified: Secondary | ICD-10-CM

## 2021-07-24 ENCOUNTER — Other Ambulatory Visit: Payer: Self-pay | Admitting: Family Medicine

## 2021-07-24 DIAGNOSIS — I10 Essential (primary) hypertension: Secondary | ICD-10-CM

## 2021-07-24 NOTE — Telephone Encounter (Signed)
Requested Prescriptions  Pending Prescriptions Disp Refills  . metoprolol succinate (TOPROL-XL) 50 MG 24 hr tablet [Pharmacy Med Name: METOPROLOL ER SUCCINATE 50MG  TABS] 90 tablet 0    Sig: TAKE 1 TABLET(50 MG) BY MOUTH EVERY MORNING     Cardiovascular:  Beta Blockers Failed - 07/24/2021 11:17 AM      Failed - Last BP in normal range    BP Readings from Last 1 Encounters:  06/24/21 (!) 159/59         Passed - Last Heart Rate in normal range    Pulse Readings from Last 1 Encounters:  06/24/21 (!) 52         Passed - Valid encounter within last 6 months    Recent Outpatient Visits          1 month ago Aortic atherosclerosis ([place])   [facility] [place], Coralie Keens, NP   1 month ago Thrombocytopenia [facility])   [facility] [place], Coralie Keens, NP   2 months ago Primary hypertension   [place], Coralie Keens, NP   2 months ago Feeling jittery   [facility] [place], Coralie Keens, NP   3 months ago Essential hypertension   [place], NP      Future Appointments            In 3 weeks Baity, Coralie Keens, NP [facility], [place]   In [place], Coralie Keens, NP [facility], [facility]

## 2021-07-29 ENCOUNTER — Other Ambulatory Visit: Payer: Self-pay | Admitting: Internal Medicine

## 2021-07-29 ENCOUNTER — Other Ambulatory Visit: Payer: Self-pay

## 2021-07-29 ENCOUNTER — Telehealth: Payer: Self-pay

## 2021-07-29 ENCOUNTER — Ambulatory Visit (INDEPENDENT_AMBULATORY_CARE_PROVIDER_SITE_OTHER): Payer: PPO

## 2021-07-29 ENCOUNTER — Telehealth: Payer: PPO | Admitting: General Practice

## 2021-07-29 DIAGNOSIS — I10 Essential (primary) hypertension: Secondary | ICD-10-CM

## 2021-07-29 DIAGNOSIS — D696 Thrombocytopenia, unspecified: Secondary | ICD-10-CM

## 2021-07-29 DIAGNOSIS — E7800 Pure hypercholesterolemia, unspecified: Secondary | ICD-10-CM

## 2021-07-29 DIAGNOSIS — F419 Anxiety disorder, unspecified: Secondary | ICD-10-CM

## 2021-07-29 DIAGNOSIS — E039 Hypothyroidism, unspecified: Secondary | ICD-10-CM

## 2021-07-29 MED ORDER — AMLODIPINE BESYLATE 5 MG PO TABS: ORAL_TABLET | ORAL | 0 refills | Status: DC

## 2021-07-29 NOTE — Telephone Encounter (Signed)
  Care Management   Follow Up Note   07/29/2021 Name: [name] MRN: [id number] DOB: 02-26-1941   Referred by: [name], NP Reason for referral : Chronic Care Management (RNCM: Follow up for Chronic Disease Management and Care Coordination Needs )   Patient came to the office to see the [facility]. Did not realize that it was a phone call. See new encounter.  Follow Up Plan: Face to Face appointment with care management team member scheduled for: 10-07-2021 at 63 am  Noreene Larsson RN, MSN, [place] Mobile: [phone]

## 2021-07-29 NOTE — Chronic Care Management (AMB) (Signed)
Chronic Care Management   CCM RN Visit Note  07/29/2021 Name: [name] MRN: [id number] DOB: Nov 24, 1940  Subjective: [name] is a 80 y.o. year old female who is a primary care patient of Jocelyn Fenton, NP. The care management team was consulted for assistance with disease management and care coordination needs.    Engaged with patient by telephone for follow up visit in response to provider referral for case management and/or care coordination services.   Consent to Services:  The patient was given information about Chronic Care Management services, agreed to services, and gave verbal consent prior to initiation of services.  Please see initial visit note for detailed documentation.   Patient agreed to services and verbal consent obtained.   Assessment: Review of patient past medical history, allergies, medications, health status, including review of consultants reports, laboratory and other test data, was performed as part of comprehensive evaluation and provision of chronic care management services.   SDOH (Social Determinants of Health) assessments and interventions performed:    CCM Care Plan  Allergies  Allergen Reactions   Codeine Nausea Only    Tolerates oxycodone   Morphine Nausea And Vomiting   Ace Inhibitors Cough   Adhesive [Tape] Other (See Comments) and Rash    whelps    Outpatient Encounter Medications as of 07/29/2021  Medication Sig   acetaminophen (TYLENOL) 500 MG tablet Take 500 mg by mouth every 6 (six) hours as needed for moderate pain.    amLODipine (NORVASC) 5 MG tablet TAKE 1 TABLET(5 MG) BY MOUTH DAILY   atorvastatin (LIPITOR) 10 MG tablet Take 1 tablet (10 mg total) by mouth daily.   citalopram (CELEXA) 20 MG tablet Take 1 tablet (20 mg total) by mouth daily.   levothyroxine (SYNTHROID) 150 MCG tablet Take 1 tablet (150 mcg total) by mouth daily.   losartan (COZAAR) 100 MG tablet Take 1 tablet (100 mg total) by mouth daily.    metoprolol succinate (TOPROL-XL) 50 MG 24 hr tablet TAKE 1 TABLET(50 MG) BY MOUTH EVERY MORNING   Multiple Vitamin (MULTIVITAMIN) capsule Take 1 capsule by mouth daily.   rOPINIRole (REQUIP) 0.5 MG tablet Take 1 tablet (0.5 mg total) by mouth at bedtime.   traZODone (DESYREL) 50 MG tablet TAKE 1 TABLET(50 MG) BY MOUTH AT BEDTIME   No facility-administered encounter medications on file as of 07/29/2021.    Patient Active Problem List   Diagnosis Date Noted   Thrombocytopenia ([address]) 06/10/2021   Cognitive impairment 06/10/2021   Aortic atherosclerosis ([place]) 05/08/2021   Overweight with body mass index (BMI) of 29 to 29.9 in adult 03/18/2021   RLS (restless legs syndrome) 01/29/2021   Obstructive chronic bronchitis without exacerbation ([place]) 01/29/2021   Hyperlipidemia 07/09/2020   Osteopenia of left femoral neck 06/09/2019   Hyperparathyroidism ([place]) 04/15/2019   Acquired hypothyroidism 08/05/2018   Anxiety 08/04/2018   Psychophysiological insomnia 08/04/2018   Osteoarthritis 04/14/2018   Hypertension 11/23/2017    Conditions to be addressed/monitored:HTN, HLD, Anxiety, Hypothyroidism, and Thrombocytopenia   Care Plan : RNCM: General Plan of Care (Adult)  Updates made by Vanita Ingles, RN since 07/29/2021 12:00 AM     Problem: RNCM; Adult Plan of Care for Chronic Disease Management and Care Coordination Needs   Priority: High  Onset Date: 06/24/2021     Long-Range Goal: RNCM: Adult Plan of Care for Chronic Disease Management and Care Coordination Needs   Start Date: 06/24/2021  Expected End Date: 06/19/2022  Recent Progress: On track  Priority: High  Note:   Current Barriers:  Knowledge Deficits related to plan of care for management of HTN, HLD, Anxiety with Excessive Worry, Social Anxiety,, and thrombocytopenia and hypothyroidism  Chronic Disease Management support and education needs related to HTN, HLD, Anxiety with Excessive Worry, Social Anxiety,, and thrombocytopenia  and hypothyroidism  Cognitive Deficits  RNCM Clinical Goal(s):  Patient will verbalize understanding of plan for management of HTN, HLD, Anxiety, and thrombocytopenia and hypothyroidism  verbalize basic understanding of HTN, HLD, Anxiety, and hypothyroidism and thrombocytopenia  disease process and self health management plan   take all medications exactly as prescribed and will call provider for medication related questions demonstrate understanding of rationale for each prescribed medication   attend all scheduled medical appointments: 08-20-2021 at 0800 am demonstrate improved and ongoing adherence to prescribed treatment plan for HTN, HLD, Anxiety, and thrombocytopenia and hypothyroidism  as evidenced by adherence to prescribed medication regimen contacting provider for new or worsened symptoms or questions   demonstrate improved and ongoing health management independence by taking medications as prescribed, calling the office for changes in conditions, questions, or new concerns continue to work with RN Care Manager to address care management and care coordination needs related to HTN, HLD, Anxiety, and thrombocytopenia, and hypothyroidism   work with pharmacist to address Cognitive Deficits and medication reconciliation and ongoing support related to HTN, HLD, Anxiety, and thrombocytopenia, and hyporthyroidism demonstrate a decrease in HTN, HLD, Anxiety, and hypothyroidism and thrombocytopenia exacerbations   demonstrate ongoing self health care management ability    through collaboration with RN Care manager, provider, and care team.   Interventions: 1:1 collaboration with primary care provider regarding development and update of comprehensive plan of care as evidenced by provider attestation and co-signature Inter-disciplinary care team collaboration (see longitudinal plan of care) Evaluation of current treatment plan related to  self management and patient's adherence to plan as  established by provider   SDOH Barriers (Status: New goal. Goal on track: YES.)  Patient interviewed and SDOH assessment performed        SDOH Interventions    Flowsheet Row Most Recent Value  SDOH Interventions   Food Insecurity Interventions Intervention Not Indicated  Financial Strain Interventions Intervention Not Indicated  Intimate Partner Violence Interventions Intervention Not Indicated  Physical Activity Interventions Other (Comments)  [no structured activity]  Stress Interventions Intervention Not Indicated  Social Connections Interventions Other (Comment)  [good support system]  Transportation Interventions Intervention Not Indicated     Patient interviewed and appropriate assessments performed Provided patient with information about CCM team and working with the patient to optimize health and well being Discussed plans with patient for ongoing care management follow up and provided patient with direct contact information for care management team Advised patient to call the office for changes in conditions, questions, or concerns about health and well being Collaborated with RN Case Manager re: working with the pharm D to assist with medications reconciliation and ongoing education and support of medications.     Anemia/Bleeding( Thrombocytopenia)  (Status: New goal. Goal on track: NO.) Assessment of understanding of anemia/bleeding disorder diagnosis Basic overview and discussion of anemia/bleeding disorder or acute disease state Review of most recent labs:  Lab Results  Component Value Date   WBC 6.7 06/10/2021   HGB 16.2 (H) 06/10/2021   HCT 50.0 (H) 06/10/2021   MCV 98.2 06/10/2021   PLT 118 (L) 06/10/2021   Medications reviewed. 07-29-2021: Face to face visit with the patient review of medications. She  is compliant with medications. Ask for assistance with getting a refill for her amlodipine 5 mg that does not have refills. Will collaborate with the pcp and pharm  D - Counseled on bleeding risk associated with thrombocytopenia and importance of self-monitoring for signs/symptoms of bleeding. 07-29-2021: printed off material on thrombocytopenia and reviewed with the patient. The patient is having a hard time understanding what it means to have low platelets. Extensive education. The patient is having blood work today to be evaluated before her upcoming physical.  - Counseled on avoidance of NSAIDs due to increased bleeding risk, extensive education on avoiding NSAIDS. The patient ask if taking aspirin and ibuprofen were okay to take. The patient advised to avoid aspirin, ibuprofen, advil, aleve, naproxen and other medications classified as NSAIDS. Advised the patient if she had to take something for a headache to use Tylenol and not advil or ibuprofen. Used teach back method to enforce the need to not use aspirin or NSAIDS. Will also consult the pharm D for assistance with education on avoiding ASA and NSAIDS. 07-29-2021: Reviewed with the patient not to take NSAIDS as this puts her at higher risk of bleeding. The patient states she only takes Tylenol when she needs it.  - Counseled on importance of regular laboratory monitoring as directed by provider.  07-29-2021: Is having lab work today in the office ; - Provided education about signs and symptoms of active bleeding such as stomach discomfort, coughing up blood or blood tinged secretions, bleeding from the gums/teeth, nosebleeds, increased bruising, blood in the urine/stool and/or if a traumatic injury occurs, regardless of severity of injury. 07-29-2021: Reviewed with the patient to call the office for changes in condition or noting any bleeding in urine, stool or more bruising than usual. ;  - Advised to call provider or 911 if active bleeding or signs and symptoms of active bleeding occur; - recommended promotion of rest and energy-conserving measures to manage fatigue, such as balancing activity with periods of  rest - encouraged strategies to prevent falls related to fatigue, weakness and dizziness; encouraged sitting before standing and using an assistive device - encouraged optimal oral intake to support fluid balance and nutrition - encouraged dietary changes to increase dietary intake of iron, Vitamin E52 and folic acid as advised/prescribed Education provided from pcp notes that the pcp feels thrombocytopenia may be coming from medications she had been taken. Reviewed medication changes with the patient and she verbalized understanding.   Anxiety   (Status: Goal on Track (progressing): YES.) Evaluation of current treatment plan related to Anxiety,  self-management and patient's adherence to plan as established by provider. 07-29-2021: The patient feels that she is stable with her anxiety. She denies any acute exacerbations. Recent friend of the family passed away and she reflected on the services. Empathetic listening and support given.  Discussed plans with patient for ongoing care management follow up and provided patient with direct contact information for care management team Advised patient to call the office for changes in mood, anxiety, or depression ; Provided education to patient re: pacing activity, being safe in her environement, calling for changes, and being socially active; Reviewed medications with patient and discussed compliance.  07-29-2021: the patient states that she does not like to take medications for anxiety but has them if she needs them.  She uses diversional activities when she is anxious; Reviewed scheduled/upcoming provider appointments including 08-20-2021 at 0800 am; Discussed plans with patient for ongoing care management follow up and provided patient  with direct contact information for care management team; Screening for signs and symptoms of depression related to chronic disease state;  Assessed social determinant of health barriers;   Hyperlipidemia:  (Status: Goal on  track: NO.) Lab Results  Component Value Date   CHOL 207 (H) 11/01/2018   HDL 52 11/01/2018   LDLCALC 132 (H) 11/01/2018   TRIG 124 11/01/2018   CHOLHDL 4.0 11/01/2018     Medication review performed; medication list updated in electronic medical record. [id number]: the patient had a change from simvastatin to atorvastatin 10 mg daily. The pcp feels simvastatin may be contributing to thrombocytopenia issue. Extensive review of medication changes with the patient for clarity and compliance. Also education on taking cholesterol medications with night time for it to be more beneficial for the patient. 07-29-2021: The patient is compliant with medications. Had medications bottles with her at visit. Provider established cholesterol goals reviewed; Counseled on importance of regular laboratory monitoring as prescribed.  07-29-2021: The patient had new lab work today after visit with [facility]; Provided HLD educational materials; Reviewed role and benefits of statin for ASCVD risk reduction; Discussed strategies to manage statin-induced myalgias; Reviewed importance of limiting foods high in cholesterol 07-29-2021: The patient states she is compliant with a heart healthy/ADA diet Reviewed exercise goals and target of 150 minutes per week. 06-24-2021: Does not do any structured exercise. Education and support given.   Hypertension: (Status: New goal. Goal on track: YES.) Last practice recorded BP readings:  BP Readings from Last 3 Encounters:  06/24/21 (!) 159/59  06/10/21 (!) 151/68  05/21/21 (!) 162/78  Most recent eGFR/CrCl: No results found for: EGFR  No components found for: CRCL  Evaluation of current treatment plan related to hypertension self management and patient's adherence to plan as established by provider.  07-29-2021: The patient denies any concerns with her heart health. Review of heart healthy/ADA diet and the patient states she eats good and stays hydrated. Showed the patient how to check on  the back of her hand for sx and sx of dehydration.    Provided education to patient re: stroke prevention, s/s of heart attack and stroke; Reviewed prescribed diet heart healthy/ADA diet  Reviewed medications with patient and discussed importance of compliance.  07-29-2021: The patient states she needs help with refills for amlodipine 5 mg. Will send an inbasket message to the pcp and pharm D for assistance with refills for amlodipine.5 mg. The patient has a little over a week worth of medication supply;  Discussed plans with patient for ongoing care management follow up and provided patient with direct contact information for care management team; Advised patient, providing education and rationale, to monitor blood pressure daily and record, calling PCP for findings outside established parameters;  Reviewed scheduled/upcoming provider appointments including: 08-20-2021 ar 0800 am Provided education on prescribed diet heart healthy/ADA diet ;  Discussed complications of poorly controlled blood pressure such as heart disease, stroke, circulatory complications, vision complications, kidney impairment, sexual dysfunction;    Hypothyroidism  (Status: Goal on Track (progressing): YES.) Evaluation of current treatment plan related to  hypothyroidism  ,  self-management and patient's adherence to plan as established by provider. 07-29-2021: The patient is compliant with plan of care for hypothyroidism. No new concerns today. Discussed plans with patient for ongoing care management follow up and provided patient with direct contact information for care management team Advised patient to call the office for changes in conditions, new concerns, or questions; Provided education to patient re: taking  levothyroixine as prescribed. The patient though this was one of the medicaitons she was told to stop. The patient verbalized understanding of education provided and not to stop medication; Reviewed medications with  patient and discussed compliance. The patient states she is compliant with medications. Able to use teach back method with RNCM and verbalized understanding of taking levothyroixine as prescribed. ; Collaborated with pharm D regarding medication reconciliation and ongoing support and education, spoke to pharm D today asking the pharm D to reach out to the patient for support and education in medication management; Pharmacy referral for medication reconciliation and ongoing support and education; Discussed plans with patient for ongoing care management follow up and provided patient with direct contact information for care management team;   Patient Goals/Self-Care Activities: Patient will self administer medications as prescribed as evidenced by self report/primary caregiver report  Patient will attend all scheduled provider appointments as evidenced by clinician review of documented attendance to scheduled appointments and patient/caregiver report Patient will call pharmacy for medication refills as evidenced by patient report and review of pharmacy fill history as appropriate Patient will attend church or other social activities as evidenced by patient report Patient will continue to perform ADL's independently as evidenced by patient/caregiver report Patient will continue to perform IADL's independently as evidenced by patient/caregiver report Patient will call provider office for new concerns or questions as evidenced by review of documented incoming telephone call notes and patient report Patient will work with BSW to address care coordination needs and will continue to work with the clinical team to address health care and disease management related needs as evidenced by documented adherence to scheduled care management/care coordination appointments       Plan:Face to Face appointment with care management team member scheduled for: 08-07-2021 at 64 am  Noreene Larsson RN, MSN, [place] Mobile: [phone]

## 2021-07-29 NOTE — Telephone Encounter (Signed)
Requested Prescriptions  Pending Prescriptions Disp Refills  . amLODipine (NORVASC) 5 MG tablet [Pharmacy Med Name: AMLODIPINE BESYLATE 5MG  TABLETS] 90 tablet 0    Sig: TAKE 1 TABLET(5 MG) BY MOUTH DAILY     Cardiovascular:  Calcium Channel Blockers Failed - 07/29/2021  2:42 PM      Failed - Last BP in normal range    BP Readings from Last 1 Encounters:  06/24/21 (!) 159/59         Passed - Valid encounter within last 6 months    Recent Outpatient Visits          1 month ago Aortic atherosclerosis ([place])   [facility] [place], Coralie Keens, NP   1 month ago Thrombocytopenia [facility])   [facility], Coralie Keens, NP   2 months ago Primary hypertension   [place], Coralie Keens, NP   2 months ago Feeling jittery   [facility] [place], Coralie Keens, NP   3 months ago Essential hypertension   [place], NP      Future Appointments            In 3 weeks [place], Coralie Keens, NP [facility], [place]   In 4 weeks [place], Coralie Keens, NP [place] [facility], [facility]

## 2021-07-29 NOTE — Patient Instructions (Signed)
Thrombocytopenia Thrombocytopenia means that you have a low number of platelets in your blood. Platelets are tiny cells in the blood. When you bleed, they clump together at the cut or injury to stop the bleeding. This is called blood clotting. If you do not have enough platelets, your blood may have trouble clotting. This may cause you to bleed and bruise very easily. What are the causes? This condition is caused by a low number of platelets in your blood. There are three main reasons for this: Your body not making enough platelets. This may be caused by: Bone marrow diseases. Disorders that are passed from parent to child (inherited). Certain cancer medicines or treatments. Infection from germs (bacteria or viruses). Alcoholism. Platelets not being released in the blood. This can be caused by: Having a spleen that is larger than normal. A condition called Gaucher disease. Your body destroying platelets too quickly. This may be caused by: Certain autoimmune diseases. Some medicines that thin your blood. Certain blood clotting disorders. Certain bleeding disorders. Exposure to harmful (toxic) chemicals. Pregnancy. What are the signs or symptoms? Bruising easily. Bleeding from the nose or mouth. Heavy menstrual periods. Blood in the pee (urine), poop (stool), or vomit. A purple-red color to the skin (purpura). A rash that looks like pinpoint, purple-red spots (petechiae) on the lower legs. How is this treated? Treatment depends on the cause. Treatment may include: Treatment of another condition that is causing the low platelet count. Medicines to help protect your platelets from being destroyed. A replacement (transfusion) of platelets to stop or prevent bleeding. Surgery to take out the spleen. Follow these instructions at home: Medicines Take over-the-counter and prescription medicines only as told by your doctor. Do not take any medicines that have aspirin or NSAIDs, such as  ibuprofen. Activity Avoid doing things that could hurt or bruise you. Take action to prevent falls. Do not play contact sports. Ask your doctor what activities are safe for you. Take care not to burn yourself: When you use an iron. When you cook. Take care not to cut yourself: When you shave. When you use scissors, needles, knives, or other tools. General instructions  Check your skin and the inside of your mouth for bruises or blood as told by your doctor. Wear a medical alert bracelet that says that you have a bleeding disorder. Check to see if there is blood in your pee and poop. Do this as told by your doctor. Do not drink alcohol. If you do drink, limit the amount that you drink. Stay away from harmful (toxic) chemicals. Tell all of your doctors that you have this condition. Be sure to tell your dentist and eye doctor. Tell your dentist about your condition before you have your teeth cleaned. Keep all follow-up visits. Contact a doctor if: You have bruises and you do not know why. You have new symptoms. You have symptoms that get worse. You have a fever. Get help right away if: You have very bad bleeding anywhere on your body. You have blood in your vomit, pee, or poop. You have an injury to your head. You have a sudden, very bad headache. Summary Thrombocytopenia means that you have a low number of platelets in your blood. Platelets stick together to form a clot. Symptoms of this condition include getting bruises easily, bleeding from the mouth and nose, a purple-red color to the skin, and a rash. Take care not to cut or burn yourself. This information is not intended to replace advice given   to you by your health care provider. Make sure you discuss any questions you have with your health care provider. Document Revised: 02/21/2021 Document Reviewed: 02/21/2021 Elsevier Patient Education  2022 [place]. Visit Information  Patient verbalizes understanding of  instructions provided today and agrees to view in [place].   Face to Face appointment with care management team member scheduled for:  10-07-2021 at 71 am  Noreene Larsson RN, MSN, [place] Mobile: [phone]

## 2021-07-30 LAB — COMPLETE METABOLIC PANEL WITH GFR
AG Ratio: 1.8 (calc) (ref 1.0–2.5)
ALT: 10 U/L (ref 6–29)
AST: 13 U/L (ref 10–35)
Albumin: 4.4 g/dL (ref 3.6–5.1)
Alkaline phosphatase (APISO): 58 U/L (ref 37–153)
BUN: 18 mg/dL (ref 7–25)
CO2: 27 mmol/L (ref 20–32)
Calcium: 10.4 mg/dL (ref 8.6–10.4)
Chloride: 108 mmol/L (ref 98–110)
Creat: 0.86 mg/dL (ref 0.60–1.00)
Globulin: 2.5 g/dL (calc) (ref 1.9–3.7)
Glucose, Bld: 85 mg/dL (ref 65–139)
Potassium: 4 mmol/L (ref 3.5–5.3)
Sodium: 144 mmol/L (ref 135–146)
Total Bilirubin: 0.8 mg/dL (ref 0.2–1.2)
Total Protein: 6.9 g/dL (ref 6.1–8.1)
eGFR: 69 mL/min/{1.73_m2} (ref >=60)

## 2021-07-30 LAB — LIPID PANEL
Cholesterol: 209 mg/dL — ABNORMAL HIGH (ref <200)
HDL: 54 mg/dL (ref >=50)
LDL Cholesterol (Calc): 124 mg/dL (calc) — ABNORMAL HIGH
Non-HDL Cholesterol (Calc): 155 mg/dL (calc) — ABNORMAL HIGH (ref <130)
Total CHOL/HDL Ratio: 3.9 (calc) (ref <5.0)
Triglycerides: 191 mg/dL — ABNORMAL HIGH (ref <150)

## 2021-07-30 LAB — CBC
HCT: 46.9 % — ABNORMAL HIGH (ref 35.0–45.0)
Hemoglobin: 15.8 g/dL — ABNORMAL HIGH (ref 11.7–15.5)
MCH: 32.6 pg (ref 27.0–33.0)
MCHC: 33.7 g/dL (ref 32.0–36.0)
MCV: 96.7 fL (ref 80.0–100.0)
MPV: 13.1 fL — ABNORMAL HIGH (ref 7.5–12.5)
Platelets: 110 10*3/uL — ABNORMAL LOW (ref 140–400)
RBC: 4.85 10*6/uL (ref 3.80–5.10)
RDW: 13.1 % (ref 11.0–15.0)
WBC: 6.6 10*3/uL (ref 3.8–10.8)

## 2021-07-31 NOTE — Telephone Encounter (Signed)
Pt has been rescheduled. 

## 2021-08-01 ENCOUNTER — Other Ambulatory Visit: Payer: Self-pay | Admitting: Internal Medicine

## 2021-08-01 DIAGNOSIS — F419 Anxiety disorder, unspecified: Secondary | ICD-10-CM

## 2021-08-01 NOTE — Telephone Encounter (Signed)
Call to patient- she has RF on both Rx. Requested Prescriptions  Pending Prescriptions Disp Refills  . losartan (COZAAR) 100 MG tablet [Pharmacy Med Name: LOSARTAN 100MG  TABLETS] 90 tablet 1    Sig: TAKE 1 TABLET(100 MG) BY MOUTH DAILY     Cardiovascular:  Angiotensin Receptor Blockers Failed - 08/01/2021 10:58 AM      Failed - Last BP in normal range    BP Readings from Last 1 Encounters:  06/24/21 (!) 159/59         Passed - Cr in normal range and within 180 days    Creat  Date Value Ref Range Status  07/29/2021 0.86 0.60 - 1.00 mg/dL Final         Passed - K in normal range and within 180 days    Potassium  Date Value Ref Range Status  07/29/2021 4.0 3.5 - 5.3 mmol/L Final  08/26/2012 3.3 (L) 3.5 - 5.1 mmol/L Final         Passed - Patient is not pregnant      Passed - Valid encounter within last 6 months    Recent Outpatient Visits          1 month ago Aortic atherosclerosis ([place])   [facility] [name], NP   1 month ago Thrombocytopenia [facility])   [facility] [place], Coralie Keens, NP   2 months ago Primary hypertension   [facility] [place], Coralie Keens, NP   2 months ago Feeling jittery   [place], Coralie Keens, NP   3 months ago Essential hypertension   [place], Coralie Keens, NP      Future Appointments            In 2 weeks Garnette Gunner, Coralie Keens, NP [facility], [place]   In 3 weeks [place], Coralie Keens, NP [facility], [place]           . citalopram (CELEXA) 20 MG tablet [Pharmacy Med Name: CITALOPRAM 20MG  TABLETS] 90 tablet 1    Sig: TAKE 1 TABLET(20 MG) BY MOUTH DAILY     Psychiatry:  Antidepressants - SSRI Passed - 08/01/2021 10:58 AM      Passed - Valid encounter within last 6 months    Recent Outpatient Visits          1 month ago Aortic atherosclerosis [facility])   [facility] [place], Coralie Keens, NP   1 month ago Thrombocytopenia [facility])    [facility], Coralie Keens, NP   2 months ago Primary hypertension   [facility] [place], Coralie Keens, NP   2 months ago Feeling jittery   [facility] [place], Coralie Keens, NP   3 months ago Essential hypertension   [facility] [place], Coralie Keens, NP      Future Appointments            In 2 weeks Garnette Gunner, Coralie Keens, NP [facility], [place]   In 3 weeks [place], Coralie Keens, NP [facility], [facility]

## 2021-08-02 ENCOUNTER — Encounter: Payer: Self-pay | Admitting: *Deleted

## 2021-08-12 ENCOUNTER — Inpatient Hospital Stay: Payer: PPO

## 2021-08-12 ENCOUNTER — Other Ambulatory Visit: Payer: Self-pay

## 2021-08-12 ENCOUNTER — Inpatient Hospital Stay: Payer: PPO | Attending: Internal Medicine | Admitting: Internal Medicine

## 2021-08-12 ENCOUNTER — Encounter: Payer: Self-pay | Admitting: Internal Medicine

## 2021-08-12 VITALS — BP 170/73 | HR 56 | Temp 98.4°F | Resp 18 | Wt 168.0 lb

## 2021-08-12 DIAGNOSIS — D696 Thrombocytopenia, unspecified: Secondary | ICD-10-CM | POA: Insufficient documentation

## 2021-08-12 DIAGNOSIS — D751 Secondary polycythemia: Secondary | ICD-10-CM | POA: Diagnosis not present

## 2021-08-12 NOTE — Assessment & Plan Note (Addendum)
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  The etiology is unclear.  I do long discussion with patient regarding multiple etiologies including but not limited to liver disease/medications/autoimmune disease-ITP etc.  Less likely any malignant causes.  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  Discussed with patient that ITP does not have to be treated if asymptomatic.  Would recommend treatment with steroids/other immunosuppressive therapies if platelets less than 50.  Do not recommend a bone marrow biopsy at this time.   #Elevated hematocrit-likely reactive/do not suspect any primary bone marrow process.  Check JAK2 mutation at next visit.  Recommend further work-up including-CBC CMP LDH; review of peripheral smear; /hepatitis at next visit/ 6 months.   Thank you Dr. Bonna Gains for allowing me to participate in the care of your pleasant patient. Please do not hesitate to contact me with questions or concerns in the interim.   # DISPOSITION: # no labs today # follow up in 6 months MD; labs- ordered- Dr.B  ...............................Marland Kitchen

## 2021-08-12 NOTE — Progress Notes (Signed)
New patient evaluation.   

## 2021-08-12 NOTE — Progress Notes (Signed)
[name] NOTE  Patient Care Team: [name], NP as PCP - General (Internal Medicine) Vanita Ingles, RN as Registered Nurse ([place]) Curley Spice Virl Diamond, RPH-CPP as Pharmacist  CHIEF COMPLAINTS/PURPOSE OF CONSULTATION: Thrombocytopenia  THROMBOCYTOPENIA [> 100 since 2010]; WBC- Normal. Hb16  HISTORY OF PRESENTING ILLNESS: Accompanied by her husband in wheelchair.;  Patient ambulating independently. [name] 80 y.o.  [place]referred to [place] for further evaluation of thrombocytopenia.  Patient admits to easy bruising.  Otherwise denies any gum bleeding or nosebleeds.  Patient admits to be feeling cold all the time.  Patient on Synthroid.  Medications: No new medications.  Alcohol: none  Antiplatelet therapy/blood thinners: asprin  Hepatitis/HIV: none  Liver disease/CHF: none   Review of Systems  Constitutional:  Positive for malaise/fatigue. Negative for chills, diaphoresis, fever and weight loss.  HENT:  Negative for nosebleeds and sore throat.   Eyes:  Negative for double vision.  Respiratory:  Negative for cough, hemoptysis, sputum production, shortness of breath and wheezing.   Cardiovascular:  Negative for chest pain, palpitations, orthopnea and leg swelling.  Gastrointestinal:  Negative for abdominal pain, blood in stool, constipation, diarrhea, heartburn, melena, nausea and vomiting.  Genitourinary:  Negative for dysuria, frequency and urgency.  Musculoskeletal:  Positive for back pain and joint pain.  Skin: Negative.  Negative for itching and rash.  Neurological:  Negative for dizziness, tingling, focal weakness, weakness and headaches.  Endo/Heme/Allergies:  Does not bruise/bleed easily.  Psychiatric/Behavioral:  Negative for depression. The patient is not nervous/anxious and does not have insomnia.     MEDICAL HISTORY:  Past Medical History:  Diagnosis Date   Allergy    Anemia    Anxiety    Cancer ([place])    skin  cancer   Hyperlipidemia    Hypertension    Hypothyroidism    Sciatica    Sleep apnea    does not use cpap   Thrombocytopenia (HCC)    Thyroid disease     SURGICAL HISTORY: Past Surgical History:  Procedure Laterality Date   ABDOMINAL HYSTERECTOMY     APPENDECTOMY     BACK SURGERY     lower   BILATERAL CARPAL TUNNEL RELEASE     BREAST CYST ASPIRATION Left    neg   COLONOSCOPY     COLONOSCOPY WITH PROPOFOL N/A 04/23/2021   Procedure: COLONOSCOPY WITH PROPOFOL;  Surgeon: Virgel Manifold, MD;  Location: [facility];  Service: Endoscopy;  Laterality: N/A;   ENTROPIAN REPAIR Left 06/08/2020   Procedure: ENTROPION REPAIR, SUTURES ENTROPION REPAIR, EXTENSIVE LEFT;  Surgeon: Karle Starch, MD;  Location: [place];  Service: Ophthalmology;  Laterality: Left;   JOINT REPLACEMENT     KNEE SURGERY Bilateral    KNEE SURGERY     OPEN REDUCTION INTERNAL FIXATION (ORIF) DISTAL RADIAL FRACTURE Left 06/23/2019   Procedure: OPEN REDUCTION INTERNAL FIXATION (ORIF) DISTAL RADIAL FRACTURE;  Surgeon: Hessie Knows, MD;  Location: [facility] ORS;  Service: Orthopedics;  Laterality: Left;   PARATHYROIDECTOMY  04/27/2020   SPINAL CORD STIMULATOR IMPLANT Right    THYROIDECTOMY Right 04/27/2020   Procedure: PARATHYROIDECTOMY;  Surgeon: Johnathan Hausen, MD;  Location: [facility];  Service: General;  Laterality: Right;   TOTAL KNEE REVISION Right 08/13/2015   Procedure: TOTAL KNEE REVISION;  Surgeon: Paralee Cancel, MD;  Location: [facility];  Service: Orthopedics;  Laterality: Right;    SOCIAL HISTORY: Social History   Socioeconomic History   Marital status: Married    Spouse  name: Not on file   Number of children: Not on file   Years of education: Not on file   Highest education level: Not on file  Occupational History   Occupation: retired  Tobacco Use   Smoking status: Every Day    Packs/day: 0.50    Years: 40.00    Pack years: 20.00    Types: Cigarettes   Smokeless tobacco: Never  Vaping  Use   Vaping Use: Never used  Substance and Sexual Activity   Alcohol use: No   Drug use: No   Sexual activity: Not Currently  Other Topics Concern   Not on file  Social History Narrative   Not on file   Social Determinants of Health   Financial Resource Strain: Low Risk    Difficulty of Paying Living Expenses: Not hard at all  Food Insecurity: No Food Insecurity   Worried About Charity fundraiser in the Last Year: Never true   [place] in the Last Year: Never true  Transportation Needs: No Transportation Needs   Lack of Transportation (Medical): No   Lack of Transportation (Non-Medical): No  Physical Activity: Inactive   Days of Exercise per Week: 0 days   Minutes of Exercise per Session: 0 min  Stress: No Stress Concern Present   Feeling of Stress : Not at all  Social Connections: Moderately Isolated   Frequency of Communication with Friends and Family: More than three times a week   Frequency of Social Gatherings with Friends and Family: More than three times a week   Attends Religious Services: Never   Marine scientist or Organizations: No   Attends Music therapist: Never   Marital Status: Married  Human resources officer Violence: Not At Risk   Fear of Current or Ex-Partner: No   Emotionally Abused: No   Physically Abused: No   Sexually Abused: No    FAMILY HISTORY: Family History  Problem Relation Age of Onset   Dementia Mother    Dementia Sister    Heart attack Brother    Diabetes Brother    Breast cancer Paternal Aunt    Heart attack Son    Hypercalcemia Neg Hx    Cancer Neg Hx     ALLERGIES:  is allergic to codeine, morphine, ace inhibitors, and adhesive [tape].  MEDICATIONS:  Current Outpatient Medications  Medication Sig Dispense Refill   acetaminophen (TYLENOL) 500 MG tablet Take 500 mg by mouth every 6 (six) hours as needed for moderate pain.      amLODipine (NORVASC) 5 MG tablet TAKE 1 TABLET(5 MG) BY MOUTH DAILY 90 tablet  0   atorvastatin (LIPITOR) 10 MG tablet Take 1 tablet (10 mg total) by mouth daily. 90 tablet 0   citalopram (CELEXA) 20 MG tablet Take 1 tablet (20 mg total) by mouth daily. 90 tablet 1   levothyroxine (SYNTHROID) 150 MCG tablet Take 1 tablet (150 mcg total) by mouth daily. 90 tablet 0   losartan (COZAAR) 100 MG tablet Take 1 tablet (100 mg total) by mouth daily. 90 tablet 1   metoprolol succinate (TOPROL-XL) 50 MG 24 hr tablet TAKE 1 TABLET(50 MG) BY MOUTH EVERY MORNING 90 tablet 0   Multiple Vitamin (MULTIVITAMIN) capsule Take 1 capsule by mouth daily.     rOPINIRole (REQUIP) 0.5 MG tablet Take 1 tablet (0.5 mg total) by mouth at bedtime. 90 tablet 0   traZODone (DESYREL) 50 MG tablet TAKE 1 TABLET(50 MG) BY MOUTH AT BEDTIME  90 tablet 0   No current facility-administered medications for this visit.    PHYSICAL EXAMINATION:  Vitals:   08/12/21 1356  BP: (!) 170/73  Pulse: (!) 56  Resp: 18  Temp: 98.4 F (36.9 C)   Filed Weights   08/12/21 1356  Weight: 168 lb (76.2 kg)    Physical Exam Vitals and nursing note reviewed.  HENT:     Head: Normocephalic and atraumatic.     Mouth/Throat:     Pharynx: Oropharynx is clear.  Eyes:     Extraocular Movements: Extraocular movements intact.     Pupils: Pupils are equal, round, and reactive to light.  Cardiovascular:     Rate and Rhythm: Normal rate and regular rhythm.  Pulmonary:     Comments: Decreased breath sounds bilaterally.  Abdominal:     Palpations: Abdomen is soft.  Musculoskeletal:        General: Normal range of motion.     Cervical back: Normal range of motion.  Skin:    General: Skin is warm.  Neurological:     General: No focal deficit present.     Mental Status: She is alert and oriented to person, place, and time.  Psychiatric:        Behavior: Behavior normal.        Judgment: Judgment normal.     LABORATORY DATA:  I have reviewed the data as listed Lab Results  Component Value Date   WBC 6.6  07/29/2021   HGB 15.8 (H) 07/29/2021   HCT 46.9 (H) 07/29/2021   MCV 96.7 07/29/2021   PLT 110 (L) 07/29/2021   Recent Labs    09/11/20 1147 12/05/20 0813 01/29/21 1028 05/20/21 0929 07/29/21 1359  NA 142 142 144 142 144  K 4.5 3.9 3.7 4.2 4.0  CL 107 106 109 109 108  CO2 _0 GLUCOSE 89 103* 96 89 85  BUN _1 CREATININE 0.85 0.70 0.74 0.75 0.86  CALCIUM 10.4 9.8 10.1 9.8 10.4  GFRNONAA 66  --   --   --   --   GFRAA 76  --   --   --   --   PROT 7.3 6.6 7.0  --  6.9  AST _2 --  13  ALT _3 --  10  BILITOT 0.8 0.9 0.7  --  0.8    RADIOGRAPHIC STUDIES: I have personally reviewed the radiological images as listed and agreed with the findings in the report. No results found.  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  The etiology is unclear.  I do long discussion with patient regarding multiple etiologies including but not limited to liver disease/medications/autoimmune disease-ITP etc.  Less likely any malignant causes.  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  Discussed with patient that ITP does not have to be treated if asymptomatic.  Would recommend treatment with steroids/other immunosuppressive therapies if platelets less than 50.  Do not recommend a bone marrow biopsy at this time.   #Elevated hematocrit-likely reactive/do not suspect any primary bone marrow process.  Check JAK2 mutation at next visit.  Recommend further work-up including-CBC CMP LDH; review of peripheral smear; /hepatitis at next visit/ 6 months.   Thank you Dr. Bonna Gains for allowing me to participate in the care of your pleasant patient. Please do not hesitate to contact  me with questions or concerns in the interim.   # DISPOSITION: # no labs today # follow up in 6 months MD; labs- ordered-  Dr.B  ...............................Marland Kitchen    All questions were answered. The patient knows to call the clinic with any problems, questions or concerns.       Cammie Sickle, MD 08/12/2021 2:33 PM

## 2021-08-20 ENCOUNTER — Encounter: Payer: Self-pay | Admitting: Internal Medicine

## 2021-08-20 ENCOUNTER — Other Ambulatory Visit: Payer: Self-pay

## 2021-08-20 ENCOUNTER — Ambulatory Visit (INDEPENDENT_AMBULATORY_CARE_PROVIDER_SITE_OTHER): Payer: PPO | Admitting: Internal Medicine

## 2021-08-20 VITALS — BP 140/68 | HR 59 | Temp 98.2°F | Resp 17 | Ht 63.0 in | Wt 167.8 lb

## 2021-08-20 DIAGNOSIS — Z780 Asymptomatic menopausal state: Secondary | ICD-10-CM

## 2021-08-20 DIAGNOSIS — Z6829 Body mass index (BMI) 29.0-29.9, adult: Secondary | ICD-10-CM | POA: Diagnosis not present

## 2021-08-20 DIAGNOSIS — E663 Overweight: Secondary | ICD-10-CM

## 2021-08-20 DIAGNOSIS — F5104 Psychophysiologic insomnia: Secondary | ICD-10-CM | POA: Diagnosis not present

## 2021-08-20 MED ORDER — TRAZODONE HCL 50 MG PO TABS: 50.0000 mg | ORAL_TABLET | Freq: Every evening | ORAL | 0 refills | Status: DC | PRN

## 2021-08-20 NOTE — Assessment & Plan Note (Addendum)
Encourage diet and exercise for weight loss 

## 2021-08-20 NOTE — Patient Instructions (Signed)
Health Maintenance for Postmenopausal Women ?Menopause is a normal process in which your ability to get pregnant comes to an end. This process happens slowly over many months or years, usually between the ages of 48 and 55. Menopause is complete when you have missed your menstrual period for 12 months. ?It is important to talk with your health care provider about some of the most common conditions that affect women after menopause (postmenopausal women). These include heart disease, cancer, and bone loss (osteoporosis). Adopting a healthy lifestyle and getting preventive care can help to promote your health and wellness. The actions you take can also lower your chances of developing some of these common conditions. ?What are the signs and symptoms of menopause? ?During menopause, you may have the following symptoms: ?Hot flashes. These can be moderate or severe. ?Night sweats. ?Decrease in sex drive. ?Mood swings. ?Headaches. ?Tiredness (fatigue). ?Irritability. ?Memory problems. ?Problems falling asleep or staying asleep. ?Talk with your health care provider about treatment options for your symptoms. ?Do I need hormone replacement therapy? ?Hormone replacement therapy is effective in treating symptoms that are caused by menopause, such as hot flashes and night sweats. ?Hormone replacement carries certain risks, especially as you become older. If you are thinking about using estrogen or estrogen with progestin, discuss the benefits and risks with your health care provider. ?How can I reduce my risk for heart disease and stroke? ?The risk of heart disease, heart attack, and stroke increases as you age. One of the causes may be a change in the body's hormones during menopause. This can affect how your body uses dietary fats, triglycerides, and cholesterol. Heart attack and stroke are medical emergencies. There are many things that you can do to help prevent heart disease and stroke. ?Watch your blood pressure ?High  blood pressure causes heart disease and increases the risk of stroke. This is more likely to develop in people who have high blood pressure readings or are overweight. ?Have your blood pressure checked: ?Every 3-5 years if you are 18-39 years of age. ?Every year if you are 40 years old or older. ?Eat a healthy diet ? ?Eat a diet that includes plenty of vegetables, fruits, low-fat dairy products, and lean protein. ?Do not eat a lot of foods that are high in solid fats, added sugars, or sodium. ?Get regular exercise ?Get regular exercise. This is one of the most important things you can do for your health. Most adults should: ?Try to exercise for at least 150 minutes each week. The exercise should increase your heart rate and make you sweat (moderate-intensity exercise). ?Try to do strengthening exercises at least twice each week. Do these in addition to the moderate-intensity exercise. ?Spend less time sitting. Even light physical activity can be beneficial. ?Other tips ?Work with your health care provider to achieve or maintain a healthy weight. ?Do not use any products that contain nicotine or tobacco. These products include cigarettes, chewing tobacco, and vaping devices, such as e-cigarettes. If you need help quitting, ask your health care provider. ?Know your numbers. Ask your health care provider to check your cholesterol and your blood sugar (glucose). Continue to have your blood tested as directed by your health care provider. ?Do I need screening for cancer? ?Depending on your health history and family history, you may need to have cancer screenings at different stages of your life. This may include screening for: ?Breast cancer. ?Cervical cancer. ?Lung cancer. ?Colorectal cancer. ?What is my risk for osteoporosis? ?After menopause, you may be   at increased risk for osteoporosis. Osteoporosis is a condition in which bone destruction happens more quickly than new bone creation. To help prevent osteoporosis or  the bone fractures that can happen because of osteoporosis, you may take the following actions: ?If you are 19-50 years old, get at least 1,000 mg of calcium and at least 600 international units (IU) of vitamin D per day. ?If you are older than age 50 but younger than age 70, get at least 1,200 mg of calcium and at least 600 international units (IU) of vitamin D per day. ?If you are older than age 70, get at least 1,200 mg of calcium and at least 800 international units (IU) of vitamin D per day. ?Smoking and drinking excessive alcohol increase the risk of osteoporosis. Eat foods that are rich in calcium and vitamin D, and do weight-bearing exercises several times each week as directed by your health care provider. ?How does menopause affect my mental health? ?Depression may occur at any age, but it is more common as you become older. Common symptoms of depression include: ?Feeling depressed. ?Changes in sleep patterns. ?Changes in appetite or eating patterns. ?Feeling an overall lack of motivation or enjoyment of activities that you previously enjoyed. ?Frequent crying spells. ?Talk with your health care provider if you think that you are experiencing any of these symptoms. ?General instructions ?See your health care provider for regular wellness exams and vaccines. This may include: ?Scheduling regular health, dental, and eye exams. ?Getting and maintaining your vaccines. These include: ?Influenza vaccine. Get this vaccine each year before the flu season begins. ?Pneumonia vaccine. ?Shingles vaccine. ?Tetanus, diphtheria, and pertussis (Tdap) booster vaccine. ?Your health care provider may also recommend other immunizations. ?Tell your health care provider if you have ever been abused or do not feel safe at home. ?Summary ?Menopause is a normal process in which your ability to get pregnant comes to an end. ?This condition causes hot flashes, night sweats, decreased interest in sex, mood swings, headaches, or lack  of sleep. ?Treatment for this condition may include hormone replacement therapy. ?Take actions to keep yourself healthy, including exercising regularly, eating a healthy diet, watching your weight, and checking your blood pressure and blood sugar levels. ?Get screened for cancer and depression. Make sure that you are up to date with all your vaccines. ?This information is not intended to replace advice given to you by your health care provider. Make sure you discuss any questions you have with your health care provider. ?Document Revised: 01/28/2021 Document Reviewed: 01/28/2021 ?Elsevier Patient Education ? 2022 [employer] ? ?

## 2021-08-20 NOTE — Progress Notes (Signed)
Subjective:    Patient ID: [name], female    DOB: [birth year], 80 y.o.   MRN: [id number]  HPI  Patient presents the clinic today for her annual exam.  Flu: 05/2021 Tetanus: unsure COVID: Pfizer x 4 Pneumovax: 05/2020 Prevnar: 07/2018 Shingrix: Never Pap smear: No longer screening Mammogram: 10/2020 Bone density:  > 5 year Colon screening: 04/2021 Vision screening: as needed Dentist: no, dentures  Diet: She does eat meat. She consumes some fruits and veggies. She does eat some fried foods. She drinks mostly water, tea. Exercise: Walking  Review of Systems     Past Medical History:  Diagnosis Date   Allergy    Anemia    Anxiety    Cancer ([place])    skin cancer   Hyperlipidemia    Hypertension    Hypothyroidism    Sciatica    Sleep apnea    does not use cpap   Thrombocytopenia (HCC)    Thyroid disease     Current Outpatient Medications  Medication Sig Dispense Refill   acetaminophen (TYLENOL) 500 MG tablet Take 500 mg by mouth every 6 (six) hours as needed for moderate pain.      amLODipine (NORVASC) 5 MG tablet TAKE 1 TABLET(5 MG) BY MOUTH DAILY 90 tablet 0   atorvastatin (LIPITOR) 10 MG tablet Take 1 tablet (10 mg total) by mouth daily. 90 tablet 0   citalopram (CELEXA) 20 MG tablet Take 1 tablet (20 mg total) by mouth daily. 90 tablet 1   levothyroxine (SYNTHROID) 150 MCG tablet Take 1 tablet (150 mcg total) by mouth daily. 90 tablet 0   losartan (COZAAR) 100 MG tablet Take 1 tablet (100 mg total) by mouth daily. 90 tablet 1   metoprolol succinate (TOPROL-XL) 50 MG 24 hr tablet TAKE 1 TABLET(50 MG) BY MOUTH EVERY MORNING 90 tablet 0   Multiple Vitamin (MULTIVITAMIN) capsule Take 1 capsule by mouth daily.     rOPINIRole (REQUIP) 0.5 MG tablet Take 1 tablet (0.5 mg total) by mouth at bedtime. 90 tablet 0   traZODone (DESYREL) 50 MG tablet TAKE 1 TABLET(50 MG) BY MOUTH AT BEDTIME (Patient not taking: Reported on 08/20/2021) 90 tablet 0   No current  facility-administered medications for this visit.    Allergies  Allergen Reactions   Codeine Nausea Only    Tolerates oxycodone   Morphine Nausea And Vomiting   Ace Inhibitors Cough   Adhesive [Tape] Other (See Comments) and Rash    whelps    Family History  Problem Relation Age of Onset   Dementia Mother    Dementia Sister    Heart attack Brother    Diabetes Brother    Breast cancer Paternal Aunt    Heart attack Son    Hypercalcemia Neg Hx    Cancer Neg Hx     Social History   Socioeconomic History   Marital status: Married    Spouse name: Not on file   Number of children: Not on file   Years of education: Not on file   Highest education level: Not on file  Occupational History   Occupation: retired  Tobacco Use   Smoking status: Every Day    Packs/day: 0.50    Years: 40.00    Pack years: 20.00    Types: Cigarettes   Smokeless tobacco: Never  Vaping Use   Vaping Use: Never used  Substance and Sexual Activity   Alcohol use: No   Drug use: No   Sexual activity:  Not Currently  Other Topics Concern   Not on file  Social History Narrative   Not on file   Social Determinants of Health   Financial Resource Strain: Low Risk    Difficulty of Paying Living Expenses: Not hard at all  Food Insecurity: No Food Insecurity   Worried About [place] in the Last Year: Never true   [place] in the Last Year: Never true  Transportation Needs: No Transportation Needs   Lack of Transportation (Medical): No   Lack of Transportation (Non-Medical): No  Physical Activity: Inactive   Days of Exercise per Week: 0 days   Minutes of Exercise per Session: 0 min  Stress: No Stress Concern Present   Feeling of Stress : Not at all  Social Connections: Moderately Isolated   Frequency of Communication with Friends and Family: More than three times a week   Frequency of Social Gatherings with Friends and Family: More than three times a week   Attends Religious  Services: Never   Marine scientist or Organizations: No   Attends Music therapist: Never   Marital Status: Married  Human resources officer Violence: Not At Risk   Fear of Current or Ex-Partner: No   Emotionally Abused: No   Physically Abused: No   Sexually Abused: No     Constitutional: Denies fever, malaise, fatigue, headache or abrupt weight changes.  HEENT: Denies eye pain, eye redness, ear pain, ringing in the ears, wax buildup, runny nose, nasal congestion, bloody nose, or sore throat. Respiratory: Denies difficulty breathing, shortness of breath, cough or sputum production.   Cardiovascular: Denies chest pain, chest tightness, palpitations or swelling in the hands or feet.  Gastrointestinal: Denies abdominal pain, bloating, constipation, diarrhea or blood in the stool.  GU: Denies urgency, frequency, pain with urination, burning sensation, blood in urine, odor or discharge. Musculoskeletal: Patient reports intermittent joint pain.  Denies decrease in range of motion, difficulty with gait, muscle pain or joint swelling.  Skin: Denies redness, rashes, lesions or ulcercations.  Neurological: Patient reports insomnia, restless legs and difficulty with memory.  Denies dizziness, difficulty with speech or problems with balance and coordination.  Psych: Patient has a history of anxiety.  Denies depression, SI/HI.  No other specific complaints in a complete review of systems (except as listed in HPI above).  Objective:   Physical Exam  BP (!) 156/72 (BP Location: Right Arm, Patient Position: Sitting, Cuff Size: Normal)   Pulse (!) 59   Temp 98.2 F (36.8 C) (Temporal)   Resp 17   Ht 5\' 3"  (1.6 m)   Wt 167 lb 12.8 oz (76.1 kg)   LMP  (LMP Unknown)   SpO2 99%   BMI 29.72 kg/m  Wt Readings from Last 3 Encounters:  08/20/21 167 lb 12.8 oz (76.1 kg)  08/12/21 168 lb (76.2 kg)  06/24/21 167 lb (75.8 kg)    General: Appears her stated age, overweight, in NAD. Skin:  Warm, dry and intact.  HEENT: Head: normal shape and size; Eyes: sclera white, no icterus, conjunctiva pink, PERRLA and EOMs intact;  Neck:  Neck supple, trachea midline. No masses, lumps or thyromegaly present.  Cardiovascular: Normal rate and rhythm. S1,S2 noted.  No murmur, rubs or gallops noted. No JVD or BLE edema. No carotid bruits noted. Pulmonary/Chest: Normal effort and positive vesicular breath sounds. No respiratory distress. No wheezes, rales or ronchi noted.  Abdomen: Soft and nontender. Normal bowel sounds. No distention or masses  noted. Liver, spleen and kidneys non palpable. Musculoskeletal: Strength 5/5 BUE/BLE. No difficulty with gait.  Neurological: Alert and oriented. Trouble with recall. Cranial nerves II-XII grossly intact. Coordination normal.  Psychiatric: Mood and affect normal. Behavior is normal. Judgment and thought content normal.     BMET    Component Value Date/Time   NA 144 07/29/2021 1359   NA 145 (H) 12/21/2017 1017   NA 139 08/26/2012 2035   K 4.0 07/29/2021 1359   K 3.3 (L) 08/26/2012 2035   CL 108 07/29/2021 1359   CL 105 08/26/2012 2035   CO2 27 07/29/2021 1359   CO2 29 08/26/2012 2035   GLUCOSE 85 07/29/2021 1359   GLUCOSE 97 08/26/2012 2035   BUN 18 07/29/2021 1359   BUN 13 12/21/2017 1017   BUN 13 08/26/2012 2035   CREATININE 0.86 07/29/2021 1359   CALCIUM 10.4 07/29/2021 1359   CALCIUM 9.9 08/26/2012 2035   GFRNONAA 66 09/11/2020 1147   GFRAA 76 09/11/2020 1147    Lipid Panel     Component Value Date/Time   CHOL 209 (H) 07/29/2021 1359   CHOL 181 12/21/2017 1017   TRIG 191 (H) 07/29/2021 1359   HDL 54 07/29/2021 1359   HDL 50 12/21/2017 1017   CHOLHDL 3.9 07/29/2021 1359   LDLCALC 124 (H) 07/29/2021 1359    CBC    Component Value Date/Time   WBC 6.6 07/29/2021 1359   RBC 4.85 07/29/2021 1359   HGB 15.8 (H) 07/29/2021 1359   HGB 14.8 12/21/2017 1017   HCT 46.9 (H) 07/29/2021 1359   HCT 42.5 12/21/2017 1017   PLT 110 (L)  07/29/2021 1359   PLT 141 (L) 12/21/2017 1017   MCV 96.7 07/29/2021 1359   MCV 93 12/21/2017 1017   MCV 92 08/26/2012 2035   MCH 32.6 07/29/2021 1359   MCHC 33.7 07/29/2021 1359   RDW 13.1 07/29/2021 1359   RDW 12.8 12/21/2017 1017   RDW 13.1 08/26/2012 2035   LYMPHSABS 2,379 06/10/2021 1032   LYMPHSABS 2.5 12/21/2017 1017   EOSABS 101 06/10/2021 1032   EOSABS 0.3 12/21/2017 1017   BASOSABS 67 06/10/2021 1032   BASOSABS 0.0 12/21/2017 1017    Hgb A1C Lab Results  Component Value Date   HGBA1C 5.3 05/08/2021            Assessment & Plan:   Preventative Health Maintenance:  Flu shot UTD She declines tetanus for financial reasons Pneumovax and Prevnar UTD Discussed Shingrix vaccine, she will get this at the pharmacy if she decides to get COVID-vaccine UTD She no longer wants to screen for cervical cancer Mammogram due 10/2021 Bone density ordered, she will call to schedule with her mammogram 10/2021 Colon screening UTD Encouraged her to consume a balanced diet and exercise regimen Advised her to see an eye doctor and dentist annually Labs reviewed  RTC in 6 months, follow-up chronic conditions [name], NP This visit occurred during the SARS-CoV-2 public health emergency.  Safety protocols were in place, including screening questions prior to the visit, additional usage of staff PPE, and extensive cleaning of exam room while observing appropriate contact time as indicated for disinfecting solutions.

## 2021-08-21 ENCOUNTER — Other Ambulatory Visit: Payer: Self-pay | Admitting: Internal Medicine

## 2021-08-21 DIAGNOSIS — E7800 Pure hypercholesterolemia, unspecified: Secondary | ICD-10-CM

## 2021-08-21 DIAGNOSIS — E039 Hypothyroidism, unspecified: Secondary | ICD-10-CM | POA: Diagnosis not present

## 2021-08-21 DIAGNOSIS — I10 Essential (primary) hypertension: Secondary | ICD-10-CM

## 2021-08-21 DIAGNOSIS — F17210 Nicotine dependence, cigarettes, uncomplicated: Secondary | ICD-10-CM

## 2021-08-27 ENCOUNTER — Ambulatory Visit: Payer: PPO | Admitting: Internal Medicine

## 2021-09-03 ENCOUNTER — Encounter: Payer: Self-pay | Admitting: Internal Medicine

## 2021-09-03 ENCOUNTER — Other Ambulatory Visit: Payer: Self-pay

## 2021-09-03 ENCOUNTER — Ambulatory Visit (INDEPENDENT_AMBULATORY_CARE_PROVIDER_SITE_OTHER): Payer: PPO | Admitting: Internal Medicine

## 2021-09-03 VITALS — BP 140/78 | HR 58 | Temp 97.5°F | Resp 18 | Ht 63.0 in | Wt 167.0 lb

## 2021-09-03 DIAGNOSIS — R599 Enlarged lymph nodes, unspecified: Secondary | ICD-10-CM

## 2021-09-03 DIAGNOSIS — R6889 Other general symptoms and signs: Secondary | ICD-10-CM | POA: Diagnosis not present

## 2021-09-03 DIAGNOSIS — I10 Essential (primary) hypertension: Secondary | ICD-10-CM | POA: Diagnosis not present

## 2021-09-03 DIAGNOSIS — R519 Headache, unspecified: Secondary | ICD-10-CM | POA: Diagnosis not present

## 2021-09-03 NOTE — Progress Notes (Signed)
Subjective:    Patient ID: [name], female    DOB: 1941/04/22, 80 y.o.   MRN: [id number]  HPI  Pt presents to the clinic today with c/o headache, sore glands and cold intolerance. This has been a ongoing issue. She describes the headache as a "russling" sound in her head. She denies dizziness or visual changes. She denies neck pain. Her BP today is 179/66. She reports she has been taking her blood pressure medication as prescribed. She denies runny nose, nasal congestion, ear pain, sore throat, cough or shortness of breath. She denies fever, chills or body aches. She has had her thyroid checked 05/2021, which was normal. She does have a history of thrombocytopenia but is not anemic. She has not taken anything OTC for these symptoms.  Review of Systems    Past Medical History:  Diagnosis Date   Allergy    Anemia    Anxiety    Cancer ([place])    skin cancer   Hyperlipidemia    Hypertension    Hypothyroidism    Sciatica    Sleep apnea    does not use cpap   Thrombocytopenia (HCC)    Thyroid disease     Current Outpatient Medications  Medication Sig Dispense Refill   acetaminophen (TYLENOL) 500 MG tablet Take 500 mg by mouth every 6 (six) hours as needed for moderate pain.      amLODipine (NORVASC) 5 MG tablet TAKE 1 TABLET(5 MG) BY MOUTH DAILY 90 tablet 0   atorvastatin (LIPITOR) 10 MG tablet Take 1 tablet (10 mg total) by mouth daily. 90 tablet 0   cetirizine (ZYRTEC) 10 MG tablet TAKE 1 TABLET(10 MG) BY MOUTH DAILY 90 tablet 0   citalopram (CELEXA) 20 MG tablet Take 1 tablet (20 mg total) by mouth daily. 90 tablet 1   levothyroxine (SYNTHROID) 150 MCG tablet Take 1 tablet (150 mcg total) by mouth daily. 90 tablet 0   losartan (COZAAR) 100 MG tablet Take 1 tablet (100 mg total) by mouth daily. 90 tablet 1   metoprolol succinate (TOPROL-XL) 50 MG 24 hr tablet TAKE 1 TABLET(50 MG) BY MOUTH EVERY MORNING 90 tablet 0   Multiple Vitamin (MULTIVITAMIN) capsule Take 1 capsule by  mouth daily.     rOPINIRole (REQUIP) 0.5 MG tablet Take 1 tablet (0.5 mg total) by mouth at bedtime. 90 tablet 0   traZODone (DESYREL) 50 MG tablet Take 1 tablet (50 mg total) by mouth at bedtime as needed for sleep. 90 tablet 0   No current facility-administered medications for this visit.    Allergies  Allergen Reactions   Codeine Nausea Only    Tolerates oxycodone   Morphine Nausea And Vomiting   Ace Inhibitors Cough   Adhesive [Tape] Other (See Comments) and Rash    whelps    Family History  Problem Relation Age of Onset   Dementia Mother    Dementia Sister    Heart attack Brother    Diabetes Brother    Breast cancer Paternal Aunt    Heart attack Son    Hypercalcemia Neg Hx    Cancer Neg Hx     Social History   Socioeconomic History   Marital status: Married    Spouse name: Not on file   Number of children: Not on file   Years of education: Not on file   Highest education level: Not on file  Occupational History   Occupation: retired  Tobacco Use   Smoking status: Every Day  Packs/day: 0.50    Years: 40.00    Pack years: 20.00    Types: Cigarettes   Smokeless tobacco: Never  Vaping Use   Vaping Use: Never used  Substance and Sexual Activity   Alcohol use: No   Drug use: No   Sexual activity: Not Currently  Other Topics Concern   Not on file  Social History Narrative   Not on file   Social Determinants of Health   Financial Resource Strain: Low Risk    Difficulty of Paying Living Expenses: Not hard at all  Food Insecurity: No Food Insecurity   Worried About [place] fundraiser in the Last Year: Never true   Ran Out of Food in the Last Year: Never true  Transportation Needs: No Transportation Needs   Lack of Transportation (Medical): No   Lack of Transportation (Non-Medical): No  Physical Activity: Inactive   Days of Exercise per Week: 0 days   Minutes of Exercise per Session: 0 min  Stress: No Stress Concern Present   Feeling of Stress : Not  at all  Social Connections: Moderately Isolated   Frequency of Communication with Friends and Family: More than three times a week   Frequency of Social Gatherings with Friends and Family: More than three times a week   Attends Religious Services: Never   Marine scientist or Organizations: No   Attends Music therapist: Never   Marital Status: Married  Human resources officer Violence: Not At Risk   Fear of Current or Ex-Partner: No   Emotionally Abused: No   Physically Abused: No   Sexually Abused: No     Constitutional: Pt reports headache. Denies fever, malaise, fatigue, or abrupt weight changes.  HEENT: Pt reports sore glands. Denies eye pain, eye redness, ear pain, ringing in the ears, wax buildup, runny nose, nasal congestion, bloody nose, or sore throat. Respiratory: Denies difficulty breathing, shortness of breath, cough or sputum production.   Cardiovascular: Denies chest pain, chest tightness, palpitations or swelling in the hands or feet.  Musculoskeletal: Denies decrease in range of motion, difficulty with gait, muscle pain or joint pain and swelling.  Skin: Pt reports cold intolerance. Denies redness, rashes, lesions or ulcercations.  Neurological: Pt has difficulty with memory. Denies dizziness, difficulty with speech or problems with balance and coordination.    No other specific complaints in a complete review of systems (except as listed in HPI above).  Objective:   Physical Exam  BP 140/78    Pulse (!) 58    Temp (!) 97.5 F (36.4 C) (Temporal)    Resp 18    Ht 5\' 3"  (1.6 m)    Wt 167 lb (75.8 kg)    LMP  (LMP Unknown)    SpO2 99%    BMI 29.58 kg/m   Wt Readings from Last 3 Encounters:  08/20/21 167 lb 12.8 oz (76.1 kg)  08/12/21 168 lb (76.2 kg)  06/24/21 167 lb (75.8 kg)    General: Appears her stated age,overweight, in NAD. Skin: Warm, dry and intact. No bruising noted. HEENT: Head: normal shape and size; Eyes: sclera white, no icterus,  conjunctiva pink, PERRLA and EOMs intact;  Neck:  Neck supple, trachea midline. No masses, lumps or thyromegaly present.  Cardiovascular: Bradycardic with normal rhythm. Cap refill < 3 secs. Pulmonary/Chest: Normal effort and positive vesicular breath sounds. No respiratory distress. No wheezes, rales or ronchi noted.  Musculoskeletal: Normal flexion, extension and rotation of the cervical spine. No difficulty  with gait.  Neurological: Alert and oriented. Trouble with recall, short term memory.  BMET    Component Value Date/Time   NA 144 07/29/2021 1359   NA 145 (H) 12/21/2017 1017   NA 139 08/26/2012 2035   K 4.0 07/29/2021 1359   K 3.3 (L) 08/26/2012 2035   CL 108 07/29/2021 1359   CL 105 08/26/2012 2035   CO2 27 07/29/2021 1359   CO2 29 08/26/2012 2035   GLUCOSE 85 07/29/2021 1359   GLUCOSE 97 08/26/2012 2035   BUN 18 07/29/2021 1359   BUN 13 12/21/2017 1017   BUN 13 08/26/2012 2035   CREATININE 0.86 07/29/2021 1359   CALCIUM 10.4 07/29/2021 1359   CALCIUM 9.9 08/26/2012 2035   GFRNONAA 66 09/11/2020 1147   GFRAA 76 09/11/2020 1147    Lipid Panel     Component Value Date/Time   CHOL 209 (H) 07/29/2021 1359   CHOL 181 12/21/2017 1017   TRIG 191 (H) 07/29/2021 1359   HDL 54 07/29/2021 1359   HDL 50 12/21/2017 1017   CHOLHDL 3.9 07/29/2021 1359   LDLCALC 124 (H) 07/29/2021 1359    CBC    Component Value Date/Time   WBC 6.6 07/29/2021 1359   RBC 4.85 07/29/2021 1359   HGB 15.8 (H) 07/29/2021 1359   HGB 14.8 12/21/2017 1017   HCT 46.9 (H) 07/29/2021 1359   HCT 42.5 12/21/2017 1017   PLT 110 (L) 07/29/2021 1359   PLT 141 (L) 12/21/2017 1017   MCV 96.7 07/29/2021 1359   MCV 93 12/21/2017 1017   MCV 92 08/26/2012 2035   MCH 32.6 07/29/2021 1359   MCHC 33.7 07/29/2021 1359   RDW 13.1 07/29/2021 1359   RDW 12.8 12/21/2017 1017   RDW 13.1 08/26/2012 2035   LYMPHSABS 2,379 06/10/2021 1032   LYMPHSABS 2.5 12/21/2017 1017   EOSABS 101 06/10/2021 1032   EOSABS 0.3  12/21/2017 1017   BASOSABS 67 06/10/2021 1032   BASOSABS 0.0 12/21/2017 1017    Hgb A1C Lab Results  Component Value Date   HGBA1C 5.3 05/08/2021           Assessment & Plan:   Headache:  ? R/t blood pressure elevation Manual repeat 140/78 Continue current meds Would consider MRI brain if having persistent headaches  Swollen Glands:  Exam benign Can try Ibuprofen 200 mg OTC every 8 hours as needed  Cold Intolerance:  Thyroid function normal, no need to repeat this Likely r/t aging, thinning skin Encouraged her to layer for warmth  Return precautions discussed  [name], NP This visit occurred during the SARS-CoV-2 public health emergency.  Safety protocols were in place, including screening questions prior to the visit, additional usage of staff PPE, and extensive cleaning of exam room while observing appropriate contact time as indicated for disinfecting solutions.

## 2021-09-04 ENCOUNTER — Encounter: Payer: Self-pay | Admitting: Internal Medicine

## 2021-09-04 NOTE — Patient Instructions (Signed)
General Headache Without Cause A headache is pain or discomfort felt around the head or neck area. There are many causes and types of headaches. A few common types include: Tension headaches. Migraine headaches. Cluster headaches. Chronic daily headaches. Sometimes, the specific cause of a headache may not be found. Follow these instructions at home: Watch your condition for any changes. Let your health care provider know about them. Take these steps to help with your condition: Managing pain   Take over-the-counter and prescription medicines only as told by your health care provider. Treatment may include medicines for pain that are taken by mouth or applied to the skin. Lie down in a dark, quiet room when you have a headache. Keep lights dim if bright lights bother you or make your headaches worse. If directed, put ice on your head and neck area: Put ice in a plastic bag. Place a towel between your skin and the bag. Leave the ice on for 20 minutes, 2-3 times per day. Remove the ice if your skin turns bright red. This is very important. If you cannot feel pain, heat, or cold, you have a greater risk of damage to the area. If directed, apply heat to the affected area. Use the heat source that your health care provider recommends, such as a moist heat pack or a heating pad. Place a towel between your skin and the heat source. Leave the heat on for 20-30 minutes. Remove the heat if your skin turns bright red. This is especially important if you are unable to feel pain, heat, or cold. You have a greater risk of getting burned. Eating and drinking Eat meals on a regular schedule. If you drink alcohol: Limit how much you have to: 0-1 drink a day for women who are not pregnant. 0-2 drinks a day for men. Know how much alcohol is in a drink. In the [place] one drink equals one 12 oz bottle of beer (355 mL), one 5 oz glass of wine (148 mL), or one 1 oz glass of hard liquor (44 mL). Stop drinking  caffeine, or decrease the amount of caffeine you drink. Drink enough fluid to keep your urine pale yellow. General instructions  Keep a headache journal to help find out what may trigger your headaches. For example, write down: What you eat and drink. How much sleep you get. Any change to your diet or medicines. Try massage or other relaxation techniques. Limit stress. Sit up straight, and do not tense your muscles. Do not use any products that contain nicotine or tobacco. These products include cigarettes, chewing tobacco, and vaping devices, such as e-cigarettes. If you need help quitting, ask your health care provider. Exercise regularly as told by your health care provider. Sleep on a regular schedule. Get 7-9 hours of sleep each night, or the amount recommended by your health care provider. Keep all follow-up visits. This is important. Contact a health care provider if: Medicine does not help your symptoms. You have a headache that is different from your usual headache. You have nausea or you vomit. You have a fever. Get help right away if: Your headache: Becomes severe quickly. Gets worse after moderate to intense physical activity. You have any of these symptoms: Repeated vomiting. Pain or stiffness in your neck. Changes to your vision. Pain in an eye or ear. Problems with speech. Muscular weakness or loss of muscle control. Loss of balance or coordination. You feel faint or pass out. You have confusion. You have a seizure.  These symptoms may represent a serious problem that is an emergency. Do not wait to see if the symptoms will go away. Get medical help right away. Call your local emergency services (911 in the [place] Do not drive yourself to the hospital. Summary A headache is pain or discomfort felt around the head or neck area. There are many causes and types of headaches. In some cases, the cause may not be found. Keep a headache journal to help find out what may  trigger your headaches. Watch your condition for any changes. Let your health care provider know about them. Contact a health care provider if you have a headache that is different from the usual headache, or if your symptoms are not helped by medicine. Get help right away if your headache becomes severe, you vomit, you have a loss of vision, you lose your balance, or you have a seizure. This information is not intended to replace advice given to you by your health care provider. Make sure you discuss any questions you have with your health care provider. Document Revised: 02/06/2021 Document Reviewed: 02/06/2021 Elsevier Patient Education  [place].

## 2021-09-11 ENCOUNTER — Telehealth: Payer: Self-pay

## 2021-09-11 NOTE — Telephone Encounter (Signed)
°  Care Management   Follow Up Note   09/11/2021 Name: [name] MRN: [id number] DOB: Jan 07, 1941   Referred by: [name], NP Reason for referral : Chronic Care Management (RNCM: Missed call from the patient on 09-10-2021- Call made back to the patient today. Had to leave a message. Chronic Disease Management and Care Coordination Needs )   An unsuccessful telephone outreach was attempted today. The patient was referred to the case management team for assistance with care management and care coordination.   Follow Up Plan: A HIPPA compliant phone message was left for the patient providing contact information and requesting a return call.  Noreene Larsson RN, MSN, [place] Mobile: [phone]

## 2021-09-13 DIAGNOSIS — J449 Chronic obstructive pulmonary disease, unspecified: Secondary | ICD-10-CM | POA: Diagnosis not present

## 2021-09-13 DIAGNOSIS — F324 Major depressive disorder, single episode, in partial remission: Secondary | ICD-10-CM | POA: Diagnosis not present

## 2021-09-13 DIAGNOSIS — J309 Allergic rhinitis, unspecified: Secondary | ICD-10-CM | POA: Diagnosis not present

## 2021-09-13 DIAGNOSIS — F419 Anxiety disorder, unspecified: Secondary | ICD-10-CM | POA: Diagnosis not present

## 2021-09-13 DIAGNOSIS — I739 Peripheral vascular disease, unspecified: Secondary | ICD-10-CM | POA: Diagnosis not present

## 2021-09-13 DIAGNOSIS — E663 Overweight: Secondary | ICD-10-CM | POA: Diagnosis not present

## 2021-09-13 DIAGNOSIS — E039 Hypothyroidism, unspecified: Secondary | ICD-10-CM | POA: Diagnosis not present

## 2021-09-13 DIAGNOSIS — F17210 Nicotine dependence, cigarettes, uncomplicated: Secondary | ICD-10-CM | POA: Diagnosis not present

## 2021-09-13 DIAGNOSIS — G4700 Insomnia, unspecified: Secondary | ICD-10-CM | POA: Diagnosis not present

## 2021-09-13 DIAGNOSIS — G2581 Restless legs syndrome: Secondary | ICD-10-CM | POA: Diagnosis not present

## 2021-09-13 DIAGNOSIS — E785 Hyperlipidemia, unspecified: Secondary | ICD-10-CM | POA: Diagnosis not present

## 2021-09-13 DIAGNOSIS — I10 Essential (primary) hypertension: Secondary | ICD-10-CM | POA: Diagnosis not present

## 2021-09-18 ENCOUNTER — Other Ambulatory Visit: Payer: Self-pay | Admitting: Internal Medicine

## 2021-09-18 ENCOUNTER — Ambulatory Visit: Payer: Self-pay | Admitting: *Deleted

## 2021-09-18 NOTE — Telephone Encounter (Signed)
Requested Prescriptions  Pending Prescriptions Disp Refills   atorvastatin (LIPITOR) 10 MG tablet [Pharmacy Med Name: ATORVASTATIN 10MG  TABLETS] 90 tablet 0    Sig: TAKE 1 TABLET(10 MG) BY MOUTH DAILY     Cardiovascular:  Antilipid - Statins Failed - 09/18/2021 12:31 PM      Failed - Total Cholesterol in normal range and within 360 days    Cholesterol, Total  Date Value Ref Range Status  12/21/2017 181 100 - 199 mg/dL Final   Cholesterol  Date Value Ref Range Status  07/29/2021 209 (H) <200 mg/dL Final         Failed - LDL in normal range and within 360 days    LDL Cholesterol (Calc)  Date Value Ref Range Status  07/29/2021 124 (H) mg/dL (calc) Final    Comment:    Reference range: <100 . Desirable range <100 mg/dL for primary prevention;   <70 mg/dL for patients with CHD or diabetic patients  with > or = 2 CHD risk factors. Marland Kitchen LDL-C is now calculated using the Martin-Hopkins  calculation, which is a validated novel method providing  better accuracy than the Friedewald equation in the  estimation of LDL-C.  Cresenciano Genre et al. Annamaria Helling. 8299;371(69): 2061-2068  (http://education.[facility].com/faq/FAQ164)          Failed - Triglycerides in normal range and within 360 days    Triglycerides  Date Value Ref Range Status  07/29/2021 191 (H) <150 mg/dL Final         Passed - HDL in normal range and within 360 days    HDL  Date Value Ref Range Status  07/29/2021 54 > OR = 50 mg/dL Final  12/21/2017 50 >39 mg/dL Final         Passed - Patient is not pregnant      Passed - Valid encounter within last 12 months    Recent Outpatient Visits          2 weeks ago Frequent headaches   [facility], Coralie Keens, NP   4 weeks ago Postmenopausal estrogen deficiency   [facility], Coralie Keens, NP   2 months ago Aortic atherosclerosis [facility])   [facility] [name], NP   3 months ago Thrombocytopenia [facility])   [facility], Coralie Keens, NP   4 months ago Primary hypertension   [facility] [place], Coralie Keens, NP      Future Appointments            Tomorrow Garnette Gunner, Coralie Keens, NP [facility], [place]   In 5 months [place], Coralie Keens, NP [facility], [facility]

## 2021-09-18 NOTE — Telephone Encounter (Signed)
° ° °  Chief Complaint: severe back pain  Symptoms: right back pain radiating down right leg  Frequency: since Sunday 09/15/21 Pertinent Negatives: Patient denies denies numbness , inability to walk Disposition: [] ED /[] Urgent Care (no appt availability in office) / [x] Appointment(In office/virtual)/ []  [place] [facility]/ [] Home Care/ [] Refused Recommended Disposition  Additional Notes:  C/o severe pain after reaching into closet for a shoe and pain noted with any movement .                 Reason for Disposition  [1] SEVERE back pain (e.g., excruciating, unable to do any normal activities) AND [2] not improved 2 hours after pain medicine  Answer Assessment - Initial Assessment Questions 1. ONSET: "When did the pain begin?"      Sunday 09/15/21  2. LOCATION: "Where does it hurt?" (upper, mid or lower back)     Right lower back  3. SEVERITY: "How bad is the pain?"  (e.g., Scale 1-10; mild, moderate, or severe)   - MILD (1-3): doesn't interfere with normal activities    - MODERATE (4-7): interferes with normal activities or awakens from sleep    - SEVERE (8-10): excruciating pain, unable to do any normal activities      Any movement causes severe pain 4. PATTERN: "Is the pain constant?" (e.g., yes, no; constant, intermittent)      Constant  5. RADIATION: "Does the pain shoot into your legs or elsewhere?"     Right leg 6. CAUSE:  "What do you think is causing the back pain?"      Reached into closest to pick up a shoe and noted severe back pain  7. BACK OVERUSE:  "Any recent lifting of heavy objects, strenuous work or exercise?"     na 8. MEDICATIONS: "What have you taken so far for the pain?" (e.g., nothing, acetaminophen, NSAIDS)     Tylenol 9. NEUROLOGIC SYMPTOMS: "Do you have any weakness, numbness, or problems with bowel/bladder control?"     Denies  10. OTHER SYMPTOMS: "Do you have any other symptoms?" (e.g., fever, abdominal pain, burning with urination, blood  in urine)       Pain radiating down back or right leg  11. PREGNANCY: "Is there any chance you are pregnant?" (e.g., yes, no; LMP)       na  Protocols used: Back Pain-A-AH

## 2021-09-19 ENCOUNTER — Ambulatory Visit (INDEPENDENT_AMBULATORY_CARE_PROVIDER_SITE_OTHER): Payer: PPO | Admitting: Internal Medicine

## 2021-09-19 ENCOUNTER — Encounter: Payer: Self-pay | Admitting: Internal Medicine

## 2021-09-19 ENCOUNTER — Other Ambulatory Visit: Payer: Self-pay

## 2021-09-19 VITALS — BP 142/72 | HR 57 | Temp 97.5°F | Resp 17 | Ht 63.0 in | Wt 168.2 lb

## 2021-09-19 DIAGNOSIS — M545 Low back pain, unspecified: Secondary | ICD-10-CM | POA: Diagnosis not present

## 2021-09-19 MED ORDER — TIZANIDINE HCL 2 MG PO CAPS: 2.0000 mg | ORAL_CAPSULE | Freq: Every evening | ORAL | 0 refills | Status: DC | PRN

## 2021-09-19 NOTE — Progress Notes (Signed)
Subjective:    Patient ID: [name], female    DOB: Feb 16, 1941, 80 y.o.   MRN: [id number]  HPI  Pt presents to the clinic today with c/o right side low back pain. This started 5 days ago when she was bending down in the closet to get some shoes. She describes the pain as achy but can be sharp at times with certain movements.  The pain does not radiate.  It seems worse when she stands up for extended period times or walks.  She denies numbness, tingling, weakness of her right lower extremity.  She denies urinary, vaginal or GI symptoms.  She has tried Tylenol and heat OTC with some relief of symptoms.  She reports she has a pain stimulator implanted.  Review of Systems     Past Medical History:  Diagnosis Date   Allergy    Anemia    Anxiety    Cancer ([place])    skin cancer   Hyperlipidemia    Hypertension    Hypothyroidism    Sciatica    Sleep apnea    does not use cpap   Thrombocytopenia (HCC)    Thyroid disease     Current Outpatient Medications  Medication Sig Dispense Refill   acetaminophen (TYLENOL) 500 MG tablet Take 500 mg by mouth every 6 (six) hours as needed for moderate pain.      amLODipine (NORVASC) 5 MG tablet TAKE 1 TABLET(5 MG) BY MOUTH DAILY 90 tablet 0   atorvastatin (LIPITOR) 10 MG tablet TAKE 1 TABLET(10 MG) BY MOUTH DAILY 90 tablet 1   cetirizine (ZYRTEC) 10 MG tablet TAKE 1 TABLET(10 MG) BY MOUTH DAILY 90 tablet 0   citalopram (CELEXA) 20 MG tablet Take 1 tablet (20 mg total) by mouth daily. 90 tablet 1   levothyroxine (SYNTHROID) 150 MCG tablet Take 1 tablet (150 mcg total) by mouth daily. 90 tablet 0   losartan (COZAAR) 100 MG tablet Take 1 tablet (100 mg total) by mouth daily. 90 tablet 1   metoprolol succinate (TOPROL-XL) 50 MG 24 hr tablet TAKE 1 TABLET(50 MG) BY MOUTH EVERY MORNING 90 tablet 0   Multiple Vitamin (MULTIVITAMIN) capsule Take 1 capsule by mouth daily.     rOPINIRole (REQUIP) 0.5 MG tablet Take 1 tablet (0.5 mg total) by mouth at  bedtime. 90 tablet 0   traZODone (DESYREL) 50 MG tablet Take 1 tablet (50 mg total) by mouth at bedtime as needed for sleep. 90 tablet 0   No current facility-administered medications for this visit.    Allergies  Allergen Reactions   Codeine Nausea Only    Tolerates oxycodone   Morphine Nausea And Vomiting   Ace Inhibitors Cough   Adhesive [Tape] Other (See Comments) and Rash    whelps    Family History  Problem Relation Age of Onset   Dementia Mother    Dementia Sister    Heart attack Brother    Diabetes Brother    Breast cancer Paternal Aunt    Heart attack Son    Hypercalcemia Neg Hx    Cancer Neg Hx     Social History   Socioeconomic History   Marital status: Married    Spouse name: Not on file   Number of children: Not on file   Years of education: Not on file   Highest education level: Not on file  Occupational History   Occupation: retired  Tobacco Use   Smoking status: Every Day    Packs/day: 0.50  Years: 40.00    Pack years: 20.00    Types: Cigarettes   Smokeless tobacco: Never  Vaping Use   Vaping Use: Never used  Substance and Sexual Activity   Alcohol use: No   Drug use: No   Sexual activity: Not Currently  Other Topics Concern   Not on file  Social History Narrative   Not on file   Social Determinants of Health   Financial Resource Strain: Low Risk    Difficulty of Paying Living Expenses: Not hard at all  Food Insecurity: No Food Insecurity   Worried About Charity fundraiser in the Last Year: Never true   [place] in the Last Year: Never true  Transportation Needs: No Transportation Needs   Lack of Transportation (Medical): No   Lack of Transportation (Non-Medical): No  Physical Activity: Inactive   Days of Exercise per Week: 0 days   Minutes of Exercise per Session: 0 min  Stress: No Stress Concern Present   Feeling of Stress : Not at all  Social Connections: Moderately Isolated   Frequency of Communication with Friends  and Family: More than three times a week   Frequency of Social Gatherings with Friends and Family: More than three times a week   Attends Religious Services: Never   Marine scientist or Organizations: No   Attends Music therapist: Never   Marital Status: Married  Human resources officer Violence: Not At Risk   Fear of Current or Ex-Partner: No   Emotionally Abused: No   Physically Abused: No   Sexually Abused: No     Constitutional: Denies fever, malaise, fatigue, headache or abrupt weight changes.  Respiratory: Denies difficulty breathing, shortness of breath, cough or sputum production.   Cardiovascular: Denies chest pain, chest tightness, palpitations or swelling in the hands or feet.  Gastrointestinal: Denies abdominal pain, bloating, constipation, diarrhea or blood in the stool.  GU: Denies urgency, frequency, pain with urination, burning sensation, blood in urine, odor or discharge. Musculoskeletal: Pt reports right side low back pain. Denies difficulty with gait, or joint swelling.  Skin: Denies redness, rashes, lesions or ulcercations.  Neurological: Denies numbness, tingling, weakness or problems with balance and coordination.    No other specific complaints in a complete review of systems (except as listed in HPI above).  Objective:   Physical Exam  BP (!) 142/72    Pulse (!) 57    Temp (!) 97.5 F (36.4 C) (Temporal)    Resp 17    Ht 5\' 3"  (1.6 m)    Wt 168 lb 3.2 oz (76.3 kg)    LMP  (LMP Unknown)    SpO2 100%    BMI 29.80 kg/m   Wt Readings from Last 3 Encounters:  09/03/21 167 lb (75.8 kg)  08/20/21 167 lb 12.8 oz (76.1 kg)  08/12/21 168 lb (76.2 kg)    General: Appears her stated age, overweight, in NAD. Skin: Warm, dry and intact. No rashes noted. Cardiovascular: Normal rate and rhythm.  Pulmonary/Chest: Normal effort and positive vesicular breath sounds. No respiratory distress. No wheezes, rales or ronchi noted.  Abdomen: No CVA tenderness  noted. Musculoskeletal: Decreased flexion and extension of the spine secondary to pain.  Normal rotation.  No bony tenderness noted over the lumbar spine.  Pain with palpation of the right paralumbar muscles.  Strength 5/5 BLE.  No difficulty with gait.  Neurological: Alert and oriented  BMET    Component Value Date/Time  NA 144 07/29/2021 1359   NA 145 (H) 12/21/2017 1017   NA 139 08/26/2012 2035   K 4.0 07/29/2021 1359   K 3.3 (L) 08/26/2012 2035   CL 108 07/29/2021 1359   CL 105 08/26/2012 2035   CO2 27 07/29/2021 1359   CO2 29 08/26/2012 2035   GLUCOSE 85 07/29/2021 1359   GLUCOSE 97 08/26/2012 2035   BUN 18 07/29/2021 1359   BUN 13 12/21/2017 1017   BUN 13 08/26/2012 2035   CREATININE 0.86 07/29/2021 1359   CALCIUM 10.4 07/29/2021 1359   CALCIUM 9.9 08/26/2012 2035   GFRNONAA 66 09/11/2020 1147   GFRAA 76 09/11/2020 1147    Lipid Panel     Component Value Date/Time   CHOL 209 (H) 07/29/2021 1359   CHOL 181 12/21/2017 1017   TRIG 191 (H) 07/29/2021 1359   HDL 54 07/29/2021 1359   HDL 50 12/21/2017 1017   CHOLHDL 3.9 07/29/2021 1359   LDLCALC 124 (H) 07/29/2021 1359    CBC    Component Value Date/Time   WBC 6.6 07/29/2021 1359   RBC 4.85 07/29/2021 1359   HGB 15.8 (H) 07/29/2021 1359   HGB 14.8 12/21/2017 1017   HCT 46.9 (H) 07/29/2021 1359   HCT 42.5 12/21/2017 1017   PLT 110 (L) 07/29/2021 1359   PLT 141 (L) 12/21/2017 1017   MCV 96.7 07/29/2021 1359   MCV 93 12/21/2017 1017   MCV 92 08/26/2012 2035   MCH 32.6 07/29/2021 1359   MCHC 33.7 07/29/2021 1359   RDW 13.1 07/29/2021 1359   RDW 12.8 12/21/2017 1017   RDW 13.1 08/26/2012 2035   LYMPHSABS 2,379 06/10/2021 1032   LYMPHSABS 2.5 12/21/2017 1017   EOSABS 101 06/10/2021 1032   EOSABS 0.3 12/21/2017 1017   BASOSABS 67 06/10/2021 1032   BASOSABS 0.0 12/21/2017 1017    Hgb A1C Lab Results  Component Value Date   HGBA1C 5.3 05/08/2021            Assessment & Plan:   Acute Right-Sided  Low Back Pain without Sciatica:  Seems muscular No indication for x-ray at this time Advised her to continue Tylenol 500 mg 3 times daily Advised her to try ice but she declines as she reports this is too cold for her Rx for Zanaflex 2 mg nightly as needed-sedation caution given Back exercises given  Return precautions discussed  [name], NP This visit occurred during the SARS-CoV-2 public health emergency.  Safety protocols were in place, including screening questions prior to the visit, additional usage of staff PPE, and extensive cleaning of exam room while observing appropriate contact time as indicated for disinfecting solutions.

## 2021-09-19 NOTE — Patient Instructions (Signed)

## 2021-09-25 ENCOUNTER — Ambulatory Visit (INDEPENDENT_AMBULATORY_CARE_PROVIDER_SITE_OTHER): Payer: PPO | Admitting: Pharmacist

## 2021-09-25 DIAGNOSIS — I10 Essential (primary) hypertension: Secondary | ICD-10-CM

## 2021-09-25 NOTE — Patient Instructions (Signed)
Visit Information  Thank you for taking time to visit with me today. Please don't hesitate to contact me if I can be of assistance to you before our next scheduled telephone appointment.  Following are the goals we discussed today:   Goals Addressed             This Visit's Progress    Pharmacy Goals       Please obtain new upper arm blood pressure monitor and restart checking your home blood pressure, keep a log of the results and bring this with you to your medical appointments.  Feel free to call me with any questions or concerns. I look forward to our next call!    Wallace Cullens, PharmD, [place], CPP Clinical Pharmacist [facility] [phone]         Our next appointment is 11/25/2021 at 10 am  Please call the care guide team at [phone] if you need to cancel or reschedule your appointment.    The patient verbalized understanding of instructions, educational materials, and care plan provided today and declined offer to receive copy of patient instructions, educational materials, and care plan.

## 2021-09-25 NOTE — Chronic Care Management (AMB) (Signed)
Chronic Care Management CCM Pharmacy Note  09/25/2021 Name:  [name] MRN:  [id number] DOB:  1941-08-10   Subjective: [name] is an 81 y.o. year old female who is a primary patient of Jocelyn Fenton, [name].  The CCM team was consulted for assistance with disease management and care coordination needs.    Engaged with patient by telephone for follow up visit for pharmacy case management and/or care coordination services.   Objective:  Medications Reviewed Today     Reviewed by [name], RPH-CPP (Pharmacist) on 09/25/21 at 1016  Med List Status: <None>   Medication Order Taking? Sig Documenting Provider Last Dose Status Informant  acetaminophen (TYLENOL) 500 MG tablet [id number] Yes Take 500 mg by mouth every 6 (six) hours as needed for moderate pain.  [provider] Taking Active Self  amLODipine (NORVASC) 5 MG tablet [id number] Yes TAKE 1 TABLET(5 MG) BY MOUTH DAILY Baity, Coralie Keens, [name] Taking Active   atorvastatin (LIPITOR) 10 MG tablet [id number]  TAKE 1 TABLET(10 MG) BY MOUTH DAILY Jocelyn Fenton, [name]  Active   cetirizine (ZYRTEC) 10 MG tablet [id number]  TAKE 1 TABLET(10 MG) BY MOUTH DAILY Baity, Coralie Keens, [name]  Active   citalopram (CELEXA) 20 MG tablet [id number]  Take 1 tablet (20 mg total) by mouth daily. Jocelyn Fenton, [name]  Active   levothyroxine (SYNTHROID) 150 MCG tablet [id number] Yes Take 1 tablet (150 mcg total) by mouth daily. Jocelyn Fenton, [name] Taking Active   losartan (COZAAR) 100 MG tablet [id number] Yes Take 1 tablet (100 mg total) by mouth daily. Jocelyn Fenton, [name] Taking Active   metoprolol succinate (TOPROL-XL) 50 MG 24 hr tablet [id number] Yes TAKE 1 TABLET(50 MG) BY MOUTH EVERY MORNING Karamalegos, Devonne Doughty, DO Taking Active   Multiple Vitamin (MULTIVITAMIN) capsule [id number]  Take 1 capsule by mouth daily. [provider]  Active   rOPINIRole (REQUIP) 0.5 MG tablet [id number]  Take 1 tablet (0.5 mg total) by mouth at  bedtime. Jocelyn Fenton, [name]  Active   tizanidine (ZANAFLEX) 2 MG capsule [id number] Yes Take 1 capsule (2 mg total) by mouth at bedtime as needed for muscle spasms. Jocelyn Fenton, [name] Taking Active   traZODone (DESYREL) 50 MG tablet [id number]  Take 1 tablet (50 mg total) by mouth at bedtime as needed for sleep. Jocelyn Fenton, [name]  Active             Pertinent Labs:   Lab Results  Component Value Date   CREATININE 0.86 07/29/2021   BUN 18 07/29/2021   NA 144 07/29/2021   K 4.0 07/29/2021   CL 108 07/29/2021   CO2 27 07/29/2021   BP Readings from Last 3 Encounters:  09/19/21 (!) 142/72  09/04/21 140/78  08/20/21 140/68   Pulse Readings from Last 3 Encounters:  09/19/21 (!) 57  09/03/21 (!) 58  08/20/21 (!) 31    SDOH:  (Social Determinants of Health) assessments and interventions performed:    [place]  Review of patient past medical history, allergies, medications, health status, including review of consultants reports, laboratory and other test data, was performed as part of comprehensive evaluation and provision of chronic care management services.   Care Plan : PharmD - Medication Management; HTN  Updates made by [name], RPH-CPP since 09/25/2021 12:00 AM     Problem: Disease Progression      Long-Range Goal: Disease Progression Prevented or Minimized  Start Date: 07/01/2021  Expected End Date: 09/29/2021  This Visit's Progress: On track  Recent Progress: On track  Priority: High  Note:   Current Barriers:  Unable to achieve control of hypertension   Pharmacist Clinical Goal(s):  patient will achieve adherence to monitoring guidelines and medication adherence to achieve therapeutic efficacy achieve control of hypertension as evidenced by blood pressure readings  through collaboration with PharmD and provider.    Interventions: 1:1 collaboration with Jocelyn Fenton, [name] regarding development and update of comprehensive plan of care as  evidenced by provider attestation and co-signature Inter-disciplinary care team collaboration (see longitudinal plan of care) Perform chart review. Patient seen for Office Visit with PCP on 12/29 for right-sided low back pain. Provider advised patient: to continue Tylenol 500 mg 3 times daily Rx for Zanaflex 2 mg nightly as needed Back exercises given Today patient reports still having back pain, relieved some by using Tylenol, as well as tizanidine as needed Encourage patient to review/try back exercises from paperwork from last visit with PCP  Hypertension:   Current treatment: Amlodipine 5 mg daily Metoprolol ER 50 mg QAM Losartan 100 mg daily Patient reports home monitor is broken. Encourage patient to obtain new upper arm blood pressure monitor Denies hypertensive symptoms Counsel patient on home BP monitoring technique Counsel patient once obtain new monitor, to start monitoring home BP, to keep a log of the results, have this record for [place] to review during telephone appointment, but to call office sooner or readings outside of established parameter or for new/worsening symptoms Patient requests follow up in a couple of months  Medication Adherence: New goal.  Patient uses weekly pillbox to organize her medications Reports rarely misses a dose Patient has denied interest in using pill packaging Encourage patient to continue to use weekly pillbox. Advise patient to also use medication list from latest office visit with PCP when fills pillbox to confirm medications and dosages   Patient Goals/Self-Care Activities patient will:  - take medications as prescribed - check blood pressure, document, and provide at future appointments  Follow Up Plan: Telephone follow up appointment with care management team member scheduled for: 11/25/2021 at 10 am       Wallace Cullens, PharmD, [place], [place] [phone]

## 2021-09-26 ENCOUNTER — Other Ambulatory Visit: Payer: Self-pay | Admitting: Internal Medicine

## 2021-09-26 DIAGNOSIS — Z1231 Encounter for screening mammogram for malignant neoplasm of breast: Secondary | ICD-10-CM

## 2021-09-27 ENCOUNTER — Other Ambulatory Visit: Payer: Self-pay | Admitting: Internal Medicine

## 2021-09-27 NOTE — Telephone Encounter (Signed)
Requested Prescriptions  Pending Prescriptions Disp Refills   levothyroxine (SYNTHROID) 150 MCG tablet [Pharmacy Med Name: LEVOTHYROXINE 0.150MG  (150MCG) TAB] 90 tablet 0    Sig: TAKE 1 TABLET(150 MCG) BY MOUTH DAILY     Endocrinology:  Hypothyroid Agents Failed - 09/27/2021  8:26 AM      Failed - TSH needs to be rechecked within 3 months after an abnormal result. Refill until TSH is due.      Passed - TSH in normal range and within 360 days    TSH  Date Value Ref Range Status  05/20/2021 1.31 0.40 - 4.50 mIU/L Final         Passed - Valid encounter within last 12 months    Recent Outpatient Visits          1 week ago Acute right-sided low back pain without sciatica   [facility] [place], Coralie Keens, NP   3 weeks ago Frequent headaches   [facility] [place], Coralie Keens, NP   1 month ago Postmenopausal estrogen deficiency   [facility] [place], Coralie Keens, NP   3 months ago Aortic atherosclerosis [facility])   [facility] [name], NP   3 months ago Thrombocytopenia [facility])   [facility], Coralie Keens, NP      Future Appointments            In 4 months Baity, Coralie Keens, NP [facility], [facility]

## 2021-10-06 ENCOUNTER — Other Ambulatory Visit: Payer: Self-pay | Admitting: Internal Medicine

## 2021-10-06 DIAGNOSIS — G2581 Restless legs syndrome: Secondary | ICD-10-CM

## 2021-10-06 NOTE — Telephone Encounter (Signed)
Requested Prescriptions  Pending Prescriptions Disp Refills   rOPINIRole (REQUIP) 0.5 MG tablet [Pharmacy Med Name: ROPINIROLE 0.5MG  TABLETS] 90 tablet 0    Sig: TAKE 1 TABLET(0.5 MG) BY MOUTH AT BEDTIME     Neurology:  Parkinsonian Agents Failed - 10/06/2021  4:08 PM      Failed - Last BP in normal range    BP Readings from Last 1 Encounters:  09/19/21 (!) 142/72         Passed - Valid encounter within last 12 months    Recent Outpatient Visits          2 weeks ago Acute right-sided low back pain without sciatica   [facility] [place], Coralie Keens, NP   1 month ago Frequent headaches   [facility] [place], Coralie Keens, NP   1 month ago Postmenopausal estrogen deficiency   [facility] [place], Coralie Keens, NP   3 months ago Aortic atherosclerosis [facility])   [facility] [name], NP   3 months ago Thrombocytopenia [facility])   [facility], Coralie Keens, NP      Future Appointments            In 4 months Baity, Coralie Keens, NP [facility], [facility]

## 2021-10-07 ENCOUNTER — Ambulatory Visit: Payer: Self-pay

## 2021-10-07 ENCOUNTER — Ambulatory Visit: Payer: PPO

## 2021-10-07 ENCOUNTER — Other Ambulatory Visit: Payer: Self-pay | Admitting: Internal Medicine

## 2021-10-07 DIAGNOSIS — E7800 Pure hypercholesterolemia, unspecified: Secondary | ICD-10-CM

## 2021-10-07 DIAGNOSIS — I10 Essential (primary) hypertension: Secondary | ICD-10-CM

## 2021-10-07 DIAGNOSIS — F419 Anxiety disorder, unspecified: Secondary | ICD-10-CM

## 2021-10-07 DIAGNOSIS — D696 Thrombocytopenia, unspecified: Secondary | ICD-10-CM

## 2021-10-07 DIAGNOSIS — E039 Hypothyroidism, unspecified: Secondary | ICD-10-CM

## 2021-10-07 NOTE — Patient Instructions (Signed)
Visit Information  Thank you for taking time to visit with me today. Please don't hesitate to contact me if I can be of assistance to you before our next scheduled telephone appointment.  Following are the goals we discussed today:  Patient will self administer medications as prescribed as evidenced by self report/primary caregiver report  Patient will attend all scheduled provider appointments as evidenced by clinician review of documented attendance to scheduled appointments and patient/caregiver report Patient will call pharmacy for medication refills as evidenced by patient report and review of pharmacy fill history as appropriate Patient will attend church or other social activities as evidenced by patient report Patient will continue to perform ADL's independently as evidenced by patient/caregiver report Patient will continue to perform IADL's independently as evidenced by patient/caregiver report Patient will call provider office for new concerns or questions as evidenced by review of documented incoming telephone call notes and patient report Patient will work with BSW to address care coordination needs and will continue to work with the clinical team to address health care and disease management related needs as evidenced by documented adherence to scheduled care management/care coordination appointments  Our next appointment is by telephone on 12-09-2021 at 1030 am   Please call the care guide team at [phone] if you need to cancel or reschedule your appointment.   If you are experiencing a Mental Health or [place] or need someone to talk to, please call the Suicide and Crisis Lifeline: 988 call the [place] National Suicide Prevention Lifeline: (657) 189-2331 or TTY: 952-845-4040 TTY 3514911234) to talk to a trained counselor call 1-800-273-TALK (toll free, 24 hour hotline)   Patient verbalizes understanding of instructions and care plan provided today and agrees  to view in [place]. Active MyChart status confirmed with patient.    Noreene Larsson RN, MSN, [place] Mobile: [phone]

## 2021-10-07 NOTE — Chronic Care Management (AMB) (Signed)
Chronic Care Management   CCM RN Visit Note  10/07/2021 Name: [name] MRN: [id number] DOB: 1940-12-22  Subjective: [name] is a 81 y.o. year old female who is a primary care patient of [name], NP. The care management team was consulted for assistance with disease management and care coordination needs.    Engaged with patient face to face for follow up visit in response to provider referral for case management and/or care coordination services.   Consent to Services:  The patient was given information about Chronic Care Management services, agreed to services, and gave verbal consent prior to initiation of services.  Please see initial visit note for detailed documentation.   Patient agreed to services and verbal consent obtained.   Assessment: Review of patient past medical history, allergies, medications, health status, including review of consultants reports, laboratory and other test data, was performed as part of comprehensive evaluation and provision of chronic care management services.   SDOH (Social Determinants of Health) assessments and interventions performed:    CCM Care Plan  Allergies  Allergen Reactions   Codeine Nausea Only    Tolerates oxycodone   Morphine Nausea And Vomiting   Ace Inhibitors Cough   Adhesive [Tape] Other (See Comments) and Rash    whelps    Outpatient Encounter Medications as of 10/07/2021  Medication Sig   acetaminophen (TYLENOL) 500 MG tablet Take 500 mg by mouth every 6 (six) hours as needed for moderate pain.    amLODipine (NORVASC) 5 MG tablet TAKE 1 TABLET(5 MG) BY MOUTH DAILY   atorvastatin (LIPITOR) 10 MG tablet TAKE 1 TABLET(10 MG) BY MOUTH DAILY   cetirizine (ZYRTEC) 10 MG tablet TAKE 1 TABLET(10 MG) BY MOUTH DAILY   citalopram (CELEXA) 20 MG tablet Take 1 tablet (20 mg total) by mouth daily.   levothyroxine (SYNTHROID) 150 MCG tablet TAKE 1 TABLET(150 MCG) BY MOUTH DAILY   losartan (COZAAR) 100 MG tablet  Take 1 tablet (100 mg total) by mouth daily.   metoprolol succinate (TOPROL-XL) 50 MG 24 hr tablet TAKE 1 TABLET(50 MG) BY MOUTH EVERY MORNING   Multiple Vitamin (MULTIVITAMIN) capsule Take 1 capsule by mouth daily.   rOPINIRole (REQUIP) 0.5 MG tablet TAKE 1 TABLET(0.5 MG) BY MOUTH AT BEDTIME   tizanidine (ZANAFLEX) 2 MG capsule Take 1 capsule (2 mg total) by mouth at bedtime as needed for muscle spasms.   traZODone (DESYREL) 50 MG tablet Take 1 tablet (50 mg total) by mouth at bedtime as needed for sleep.   No facility-administered encounter medications on file as of 10/07/2021.    Patient Active Problem List   Diagnosis Date Noted   Thrombocytopenia ([place]) 06/10/2021   Cognitive impairment 06/10/2021   Aortic atherosclerosis ([place]) 05/08/2021   Overweight with body mass index (BMI) of 29 to 29.9 in adult 03/18/2021   RLS (restless legs syndrome) 01/29/2021   Obstructive chronic bronchitis without exacerbation ([place]) 01/29/2021   Hyperlipidemia 07/09/2020   Osteopenia of left femoral neck 06/09/2019   Hyperparathyroidism ([place]) 04/15/2019   Acquired hypothyroidism 08/05/2018   Anxiety 08/04/2018   Psychophysiological insomnia 08/04/2018   Osteoarthritis 04/14/2018   Hypertension 11/23/2017    Conditions to be addressed/monitored:HTN, HLD, Anxiety, Hypothyroidism, and thrombocytopenia   Care Plan : RNCM: General Plan of Care (Adult)  Updates made by Vanita Ingles, RN since 10/07/2021 12:00 AM     Problem: RNCM; Adult Plan of Care for Chronic Disease Management and Care Coordination Needs   Priority: High  Onset Date: 06/24/2021     Long-Range Goal: RNCM: Adult Plan of Care for Chronic Disease Management and Care Coordination Needs   Start Date: 06/24/2021  Expected End Date: 06/19/2022  Recent Progress: On track  Priority: High  Note:   Current Barriers:  Knowledge Deficits related to plan of care for management of HTN, HLD, Anxiety with Excessive Worry, Social Anxiety, and  thrombocytopenia and hypothyroidism  Chronic Disease Management support and education needs related to HTN, HLD, Anxiety with Excessive Worry, Social Anxiety, and thrombocytopenia and hypothyroidism  Cognitive Deficits  RNCM Clinical Goal(s):  Patient will verbalize understanding of plan for management of HTN, HLD, Anxiety, and thrombocytopenia and hypothyroidism  verbalize basic understanding of HTN, HLD, Anxiety, and hypothyroidism and thrombocytopenia  disease process and self health management plan  take all medications exactly as prescribed and will call provider for medication related questions demonstrate understanding of rationale for each prescribed medication  attend all scheduled medical appointments 02-19-2022 at 0820 am demonstrate improved and ongoing adherence to prescribed treatment plan for HTN, HLD, Anxiety, and thrombocytopenia and hypothyroidism  as evidenced by adherence to prescribed medication regimen contacting provider for new or worsened symptoms or questions  demonstrate improved and ongoing health management independence by taking medications as prescribed, calling the office for changes in conditions, questions, or new concerns continue to work with RN Care Manager to address care management and care coordination needs related to HTN, HLD, Anxiety, and thrombocytopenia, and hypothyroidism   work with pharmacist to address Cognitive Deficits and medication reconciliation and ongoing support related to HTN, HLD, Anxiety, and thrombocytopenia, and hyporthyroidism demonstrate a decrease in HTN, HLD, Anxiety, and hypothyroidism and thrombocytopenia exacerbations  demonstrate ongoing self health care management ability  through collaboration with RN Care manager, provider, and care team.   Interventions: 1:1 collaboration with primary care provider regarding development and update of comprehensive plan of care as evidenced by provider attestation and  co-signature Inter-disciplinary care team collaboration (see longitudinal plan of care) Evaluation of current treatment plan related to  self management and patient's adherence to plan as established by provider   SDOH Barriers (Status: New goal. Goal on track: YES.)  Patient interviewed and SDOH assessment performed        SDOH Interventions    Flowsheet Row Most Recent Value  SDOH Interventions   Food Insecurity Interventions Intervention Not Indicated  Financial Strain Interventions Intervention Not Indicated  Intimate Partner Violence Interventions Intervention Not Indicated  Physical Activity Interventions Other (Comments)  [no structured activity]  Stress Interventions Intervention Not Indicated  Social Connections Interventions Other (Comment)  [good support system]  Transportation Interventions Intervention Not Indicated     Patient interviewed and appropriate assessments performed Provided patient with information about CCM team and working with the patient to optimize health and well being Discussed plans with patient for ongoing care management follow up and provided patient with direct contact information for care management team Advised patient to call the office for changes in conditions, questions, or concerns about health and well being Collaborated with RN Case Manager re: working with the pharm D to assist with medications reconciliation and ongoing education and support of medications.     Anemia/Bleeding( Thrombocytopenia)  (Status: Goal on Track (progressing): YES.) Assessment of understanding of anemia/bleeding disorder diagnosis. 10-07-2021: Long standing anemia with thrombocytopenia. Face to face visit with the patient today with extensive education on risk of bleeding and protecting herself. The patient denies nose bleeds, blood in urine or stool. Does have  sporadic bruising on bilateral arms. Education provided that this is a normal finding with patients with  thrombocytopenia. Husband with the patient and reviewed risk and precautions with the husband as well. The patient ask good questions and just wanted reassurance. Basic overview and discussion of anemia/bleeding disorder or acute disease state. 10-07-2021: Review with educational information provided Review of most recent labs:  Lab Results  Component Value Date   WBC 6.6 07/29/2021   HGB 15.8 (H) 07/29/2021   HCT 46.9 (H) 07/29/2021   MCV 96.7 07/29/2021   PLT 110 (L) 07/29/2021   Medications reviewed. 07-29-2021: Face to face visit with the patient review of medications. She is compliant with medications. Ask for assistance with getting a refill for her amlodipine 5 mg that does not have refills. Will collaborate with the pcp and pharm D. 10-07-2021: Review with the patient and submitted refill request for expressed needs. Collaboration between the pcp and CCM team.  - Counseled on bleeding risk associated with thrombocytopenia and importance of self-monitoring for signs/symptoms of bleeding. 07-29-2021: printed off material on thrombocytopenia and reviewed with the patient. The patient is having a hard time understanding what it means to have low platelets. Extensive education. The patient is having blood work today to be evaluated before her upcoming physical. 10-07-2021: Review and recommendations given. Will continue to monitor for gaps in knowledge on chronic disease processes and changes.  - Counseled on avoidance of NSAIDs due to increased bleeding risk, extensive education on avoiding NSAIDS. The patient ask if taking aspirin and ibuprofen were okay to take. The patient advised to avoid aspirin, ibuprofen, advil, aleve, naproxen and other medications classified as NSAIDS. Advised the patient if she had to take something for a headache to use Tylenol and not advil or ibuprofen. Used teach back method to enforce the need to not use aspirin or NSAIDS. Will also consult the pharm D for assistance with  education on avoiding ASA and NSAIDS. 10-07-2021: Reviewed with the patient not to take NSAIDS as this puts her at higher risk of bleeding. The patient states she only takes Tylenol when she needs it. Extensive review with the patient and her husband on avoiding Aleve, Aspirin, ADVIL and other NSAID products. Questions answers and visual provided.  - Counseled on importance of regular laboratory monitoring as directed by provider.  07-29-2021: Is having lab work today in the office . 10-07-2021: Has labwork on a regular basis; - Provided education about signs and symptoms of active bleeding such as stomach discomfort, coughing up blood or blood tinged secretions, bleeding from the gums/teeth, nosebleeds, increased bruising, blood in the urine/stool and/or if a traumatic injury occurs, regardless of severity of injury. 10-07-2021 Reviewed with the patient to call the office for changes in condition or noting any bleeding in urine, stool or more bruising than usual. ;  - Advised to call provider or 911 if active bleeding or signs and symptoms of active bleeding occur; - recommended promotion of rest and energy-conserving measures to manage fatigue, such as balancing activity with periods of rest - encouraged strategies to prevent falls related to fatigue, weakness and dizziness; encouraged sitting before standing and using an assistive device - encouraged optimal oral intake to support fluid balance and nutrition - encouraged dietary changes to increase dietary intake of iron, Vitamin V40 and folic acid as advised/prescribed Education provided from pcp notes that the pcp feels thrombocytopenia may be coming from medications she had been taken. Reviewed medication changes with the patient and she  verbalized understanding. 10-07-2021: Extensive review with the patient and the patients husband at face to face visit today.   Anxiety   (Status: Goal on Track (progressing): YES.) Evaluation of current treatment plan  related to Anxiety,  self-management and patient's adherence to plan as established by provider. 07-29-2021: The patient feels that she is stable with her anxiety. She denies any acute exacerbations. Recent friend of the family passed away and she reflected on the services. Empathetic listening and support given. 10-07-2021: No acute findings related to anxiety today. The patient is calm and able to recall events and denies any acute distress. Has good support system from her husband who accompanied her today.  Discussed plans with patient for ongoing care management follow up and provided patient with direct contact information for care management team Advised patient to call the office for changes in mood, anxiety, or depression ; Provided education to patient re: pacing activity, being safe in her environement, calling for changes, and being socially active; Reviewed medications with patient and discussed compliance.  07-29-2021: the patient states that she does not like to take medications for anxiety but has them if she needs them.  She uses diversional activities when she is anxious; Reviewed scheduled/upcoming provider appointments including 02-19-2022 at 0820 am; Discussed plans with patient for ongoing care management follow up and provided patient with direct contact information for care management team; Screening for signs and symptoms of depression related to chronic disease state;  Assessed social determinant of health barriers;   Hyperlipidemia:  (Status: Goal on Track (progressing): YES.) Lab Results  Component Value Date   CHOL 209 (H) 07/29/2021   HDL 54 07/29/2021   LDLCALC 124 (H) 07/29/2021   TRIG 191 (H) 07/29/2021   CHOLHDL 3.9 07/29/2021     Medication review performed; medication list updated in electronic medical record. [id number]: the patient had a change from simvastatin to atorvastatin 10 mg daily. The pcp feels simvastatin may be contributing to thrombocytopenia issue.  Extensive review of medication changes with the patient for clarity and compliance. Also education on taking cholesterol medications with night time for it to be more beneficial for the patient. 10-07-2021: The patient is compliant with medications. Had medications bottles with her at visit. Provider established cholesterol goals reviewed 10-07-2021: Reviewed with the patient at face to face visit  Counseled on importance of regular laboratory monitoring as prescribed.  07-29-2021: The patient had new lab work today after visit with RNCM. 10-07-2021: Has regular lab work. Denies any issues with getting labwork. Accompanied by her husband.  Provided HLD educational materials. 10-07-2021: Education and support given. Questions answered Reviewed role and benefits of statin for ASCVD risk reduction; Discussed strategies to manage statin-induced myalgias; Reviewed importance of limiting foods high in cholesterol 10-07-2021: Is eating well: The patient states she is compliant with a heart healthy/ADA diet Reviewed exercise goals and target of 150 minutes per week. 06-24-2021: Does not do any structured exercise. Education and support given.   Hypertension: (Status: New goal. Goal on track: YES.) Last practice recorded BP readings:  BP Readings from Last 3 Encounters:  10/07/21 (!) 141/70  09/19/21 (!) 142/72  09/04/21 140/78  Trending to normal. Denies any headaches Most recent eGFR/CrCl: No results found for: EGFR  No components found for: CRCL  Evaluation of current treatment plan related to hypertension self management and patient's adherence to plan as established by provider.  10-07-2021: The patient denies any concerns with her heart health. Review of heart healthy/ADA diet and  the patient states she eats good and stays hydrated. Showed the patient how to check on the back of her hand for sx and sx of dehydration.  The patient states she feels fine. Sometimes she may have some dizziness. Discussed safety  and orthostatic hypotension. The patient denies any headaches or other concerns. Will continue to monitor.   Provided education to patient re: stroke prevention, s/s of heart attack and stroke; Reviewed prescribed diet heart healthy/ADA diet  Reviewed medications with patient and discussed importance of compliance.  07-29-2021: The patient states she needs help with refills for amlodipine 5 mg. Will send an inbasket message to the pcp and pharm D for assistance with refills for amlodipine.5 mg. The patient has a little over a week worth of medication supply. 10-07-2021: The patient is compliant with medications. Has medications. Calls for refills when needed.  Discussed plans with patient for ongoing care management follow up and provided patient with direct contact information for care management team; Advised patient, providing education and rationale, to monitor blood pressure daily and record, calling PCP for findings outside established parameters;  Reviewed scheduled/upcoming provider appointments including: 02-19-2022 at 0820 am Provided education on prescribed diet heart healthy/ADA diet ;  Discussed complications of poorly controlled blood pressure such as heart disease, stroke, circulatory complications, vision complications, kidney impairment, sexual dysfunction;    Hypothyroidism  (Status: Goal on Track (progressing): YES.) Evaluation of current treatment plan related to  hypothyroidism  ,  self-management and patient's adherence to plan as established by provider. 10-07-2021: The patient is compliant with plan of care for hypothyroidism. No new concerns today. Discussed plans with patient for ongoing care management follow up and provided patient with direct contact information for care management team Advised patient to call the office for changes in conditions, new concerns, or questions; Provided education to patient re: taking levothyroixine as prescribed. The patient though this was one  of the medicaitons she was told to stop. The patient verbalized understanding of education provided and not to stop medication; Reviewed medications with patient and discussed compliance. The patient states she is compliant with medications. Able to use teach back method with RNCM and verbalized understanding of taking levothyroixine as prescribed. ; Collaborated with pharm D regarding medication reconciliation and ongoing support and education, spoke to pharm D today asking the pharm D to reach out to the patient for support and education in medication management; Pharmacy referral for medication reconciliation and ongoing support and education; Discussed plans with patient for ongoing care management follow up and provided patient with direct contact information for care management team;   Patient Goals/Self-Care Activities: Patient will self administer medications as prescribed as evidenced by self report/primary caregiver report  Patient will attend all scheduled provider appointments as evidenced by clinician review of documented attendance to scheduled appointments and patient/caregiver report Patient will call pharmacy for medication refills as evidenced by patient report and review of pharmacy fill history as appropriate Patient will attend church or other social activities as evidenced by patient report Patient will continue to perform ADL's independently as evidenced by patient/caregiver report Patient will continue to perform IADL's independently as evidenced by patient/caregiver report Patient will call provider office for new concerns or questions as evidenced by review of documented incoming telephone call notes and patient report Patient will work with BSW to address care coordination needs and will continue to work with the clinical team to address health care and disease management related needs as evidenced by documented adherence to  scheduled care management/care coordination  appointments       Plan:Telephone follow up appointment with care management team member scheduled for:  12-09-2021 at 70 am  Noreene Larsson RN, MSN, [place] Mobile: [phone]

## 2021-10-22 DIAGNOSIS — E785 Hyperlipidemia, unspecified: Secondary | ICD-10-CM | POA: Diagnosis not present

## 2021-10-22 DIAGNOSIS — E039 Hypothyroidism, unspecified: Secondary | ICD-10-CM

## 2021-10-22 DIAGNOSIS — I10 Essential (primary) hypertension: Secondary | ICD-10-CM

## 2021-10-22 DIAGNOSIS — E7800 Pure hypercholesterolemia, unspecified: Secondary | ICD-10-CM | POA: Diagnosis not present

## 2021-10-22 DIAGNOSIS — F411 Generalized anxiety disorder: Secondary | ICD-10-CM | POA: Diagnosis not present

## 2021-10-23 ENCOUNTER — Encounter: Payer: Self-pay | Admitting: Internal Medicine

## 2021-10-23 ENCOUNTER — Ambulatory Visit (INDEPENDENT_AMBULATORY_CARE_PROVIDER_SITE_OTHER): Payer: PPO | Admitting: Internal Medicine

## 2021-10-23 ENCOUNTER — Other Ambulatory Visit: Payer: Self-pay

## 2021-10-23 VITALS — BP 148/66 | HR 62 | Temp 97.9°F | Wt 170.0 lb

## 2021-10-23 DIAGNOSIS — J029 Acute pharyngitis, unspecified: Secondary | ICD-10-CM

## 2021-10-23 LAB — POCT RAPID STREP A (OFFICE): Rapid Strep A Screen: NEGATIVE

## 2021-10-23 MED ORDER — METHYLPREDNISOLONE ACETATE 80 MG/ML IJ SUSP
80.0000 mg | Freq: Once | INTRAMUSCULAR | Status: AC
  Administered 2021-10-23: 80 mg via INTRAMUSCULAR

## 2021-10-23 MED ORDER — METOPROLOL SUCCINATE ER 50 MG PO TB24: ORAL_TABLET | ORAL | 1 refills | Status: DC

## 2021-10-23 NOTE — Addendum Note (Signed)
Addended by: Ashley Royalty E on: 10/23/2021 12:08 PM   Modules accepted: Orders

## 2021-10-23 NOTE — Patient Instructions (Signed)
Sore Throat When you have a sore throat, your throat may feel: Tender. Burning. Irritated. Scratchy. Painful when you swallow. Painful when you talk. Many things can cause a sore throat, such as: An infection. Allergies. Dry air. Smoke or pollution. Radiation treatment for cancer. Gastroesophageal reflux disease (GERD). A tumor. A sore throat can be the first sign of another sickness. It can happen with other problems, like: Coughing. Sneezing. Fever. Swelling of the glands in the neck. Most sore throats go away without treatment. Follow these instructions at home:   Medicines Take over-the-counter and prescription medicines only as told by your doctor. Children often get sore throats. Do not give your child aspirin. Use throat sprays to soothe your throat as told by your health care provider. Managing pain To help with pain: Sip warm liquids, such as broth, herbal tea, or warm water. Eat or drink cold or frozen liquids, such as frozen ice pops. Rinse your mouth (gargle) with a salt water mixture 3-4 times a day or as needed. To make salt water, dissolve -1 tsp (3-6 g) of salt in 1 cup (237 mL) of warm water. Do not swallow this mixture. Suck on hard candy or throat lozenges. Put a cool-mist humidifier in your bedroom at night. Sit in the bathroom with the door closed for 5-10 minutes while you run hot water in the shower. General instructions Do not smoke or use any products that contain nicotine or tobacco. If you need help quitting, ask your doctor. Get plenty of rest. Drink enough fluid to keep your pee (urine) pale yellow. Wash your hands often for at least 20 seconds with soap and water. If soap and water are not available, use hand sanitizer. Contact a doctor if: You have a fever for more than 2-3 days. You keep having symptoms for more than 2-3 days. Your throat does not get better in 7 days. You have a fever and your symptoms suddenly get worse. Your child  who is 3 months to 59 years old has a temperature of 102.71F (39C) or higher. Get help right away if: You have trouble breathing. You cannot swallow fluids, soft foods, or your spit. You have swelling in your throat or neck that gets worse. You feel like you may vomit (nauseous) and this feeling lasts a long time. You cannot stop vomiting. These symptoms may be an emergency. Get help right away. Call your local emergency services (911 in the [place] Do not wait to see if the symptoms will go away. Do not drive yourself to the hospital. Summary A sore throat is a painful, burning, irritated, or scratchy throat. Many things can cause a sore throat. Take over-the-counter medicines only as told by your doctor. Get plenty of rest. Drink enough fluid to keep your pee (urine) pale yellow. Contact a doctor if your symptoms get worse or your sore throat does not get better within 7 days. This information is not intended to replace advice given to you by your health care provider. Make sure you discuss any questions you have with your health care provider. Document Revised: 12/05/2020 Document Reviewed: 12/05/2020 Elsevier Patient Education  [place].

## 2021-10-23 NOTE — Progress Notes (Signed)
Subjective:    Patient ID: [name], female    DOB: 31-Oct-1940, 81 y.o.   MRN: [id number]  HPI  Pt presents to the clinic today with complaint of sore throat.  She reports this started 1 week ago.  She describes the pain as soreness.  She is not having any difficulty swallowing.  She does report runny nose and nasal congestion but reports this is a chronic issue and not associated with her sore throat.  She denies headache, ear pain, cough, shortness of breath.  She denies fever, chills or body aches.  She has not had sick contacts that she is aware of.  She has not tested for COVID.  She is UTD on her flu and COVID vaccines.  She denies reflux symptoms.  She has not taken any medications OTC for this.  Review of Systems     Past Medical History:  Diagnosis Date   Allergy    Anemia    Anxiety    Cancer ([place])    skin cancer   Hyperlipidemia    Hypertension    Hypothyroidism    Sciatica    Sleep apnea    does not use cpap   Thrombocytopenia (HCC)    Thyroid disease     Current Outpatient Medications  Medication Sig Dispense Refill   acetaminophen (TYLENOL) 500 MG tablet Take 500 mg by mouth every 6 (six) hours as needed for moderate pain.      amLODipine (NORVASC) 5 MG tablet TAKE 1 TABLET(5 MG) BY MOUTH DAILY 90 tablet 0   atorvastatin (LIPITOR) 10 MG tablet TAKE 1 TABLET(10 MG) BY MOUTH DAILY 90 tablet 1   cetirizine (ZYRTEC) 10 MG tablet TAKE 1 TABLET(10 MG) BY MOUTH DAILY 90 tablet 0   citalopram (CELEXA) 20 MG tablet Take 1 tablet (20 mg total) by mouth daily. 90 tablet 1   levothyroxine (SYNTHROID) 150 MCG tablet TAKE 1 TABLET(150 MCG) BY MOUTH DAILY 90 tablet 1   losartan (COZAAR) 100 MG tablet Take 1 tablet (100 mg total) by mouth daily. 90 tablet 1   metoprolol succinate (TOPROL-XL) 50 MG 24 hr tablet TAKE 1 TABLET(50 MG) BY MOUTH EVERY MORNING 90 tablet 0   Multiple Vitamin (MULTIVITAMIN) capsule Take 1 capsule by mouth daily.     rOPINIRole (REQUIP) 0.5 MG  tablet TAKE 1 TABLET(0.5 MG) BY MOUTH AT BEDTIME 90 tablet 0   tizanidine (ZANAFLEX) 2 MG capsule Take 1 capsule (2 mg total) by mouth at bedtime as needed for muscle spasms. 15 capsule 0   traZODone (DESYREL) 50 MG tablet Take 1 tablet (50 mg total) by mouth at bedtime as needed for sleep. 90 tablet 0   No current facility-administered medications for this visit.    Allergies  Allergen Reactions   Codeine Nausea Only    Tolerates oxycodone   Morphine Nausea And Vomiting   Ace Inhibitors Cough   Adhesive [Tape] Other (See Comments) and Rash    whelps    Family History  Problem Relation Age of Onset   Dementia Mother    Dementia Sister    Heart attack Brother    Diabetes Brother    Breast cancer Paternal Aunt    Heart attack Son    Hypercalcemia Neg Hx    Cancer Neg Hx     Social History   Socioeconomic History   Marital status: Married    Spouse name: Not on file   Number of children: Not on file   Years  of education: Not on file   Highest education level: Not on file  Occupational History   Occupation: retired  Tobacco Use   Smoking status: Every Day    Packs/day: 0.50    Years: 40.00    Pack years: 20.00    Types: Cigarettes   Smokeless tobacco: Never  Vaping Use   Vaping Use: Never used  Substance and Sexual Activity   Alcohol use: No   Drug use: No   Sexual activity: Not Currently  Other Topics Concern   Not on file  Social History Narrative   Not on file   Social Determinants of Health   Financial Resource Strain: Low Risk    Difficulty of Paying Living Expenses: Not hard at all  Food Insecurity: No Food Insecurity   Worried About Charity fundraiser in the Last Year: Never true   [place] in the Last Year: Never true  Transportation Needs: No Transportation Needs   Lack of Transportation (Medical): No   Lack of Transportation (Non-Medical): No  Physical Activity: Inactive   Days of Exercise per Week: 0 days   Minutes of Exercise per  Session: 0 min  Stress: No Stress Concern Present   Feeling of Stress : Not at all  Social Connections: Moderately Isolated   Frequency of Communication with Friends and Family: More than three times a week   Frequency of Social Gatherings with Friends and Family: More than three times a week   Attends Religious Services: Never   Marine scientist or Organizations: No   Attends Music therapist: Never   Marital Status: Married  Human resources officer Violence: Not At Risk   Fear of Current or Ex-Partner: No   Emotionally Abused: No   Physically Abused: No   Sexually Abused: No     Constitutional: Denies fever, malaise, fatigue, headache or abrupt weight changes.  HEENT: Patient reports runny nose, nasal congestion and sore throat.  Denies eye pain, eye redness, ear pain, ringing in the ears, wax buildup, bloody nose. Respiratory: Denies difficulty breathing, shortness of breath, cough or sputum production.   Cardiovascular: Denies chest pain, chest tightness, palpitations or swelling in the hands or feet.  Gastrointestinal: Denies abdominal pain, bloating, constipation, diarrhea or blood in the stool.  Skin: Denies redness, rashes, lesions or ulcercations.   No other specific complaints in a complete review of systems (except as listed in HPI above).  Objective:   Physical Exam  BP (!) 148/66 (BP Location: Right Arm, Patient Position: Sitting, Cuff Size: Large)    Pulse 62    Temp 97.9 F (36.6 C) (Temporal)    Wt 170 lb (77.1 kg)    LMP  (LMP Unknown)    SpO2 100%    BMI 30.11 kg/m   Wt Readings from Last 3 Encounters:  10/07/21 168 lb (76.2 kg)  09/19/21 168 lb 3.2 oz (76.3 kg)  09/03/21 167 lb (75.8 kg)    General: Appears her stated age, obese in NAD. Skin: Warm, dry and intact. No rashes noted. HEENT: Head: normal shape and size; Throat/Mouth: Teeth present, mucosa pink and moist, no exudate, lesions or ulcerations noted.  Neck:  Neck supple, trachea  midline. No masses, lumps or thyromegaly present.  Cardiovascular: Normal rate and rhythm.  Pulmonary/Chest: Normal effort and positive vesicular breath sounds. No respiratory distress. No wheezes, rales or ronchi noted.    BMET    Component Value Date/Time   NA 144 07/29/2021 1359  NA 145 (H) 12/21/2017 1017   NA 139 08/26/2012 2035   K 4.0 07/29/2021 1359   K 3.3 (L) 08/26/2012 2035   CL 108 07/29/2021 1359   CL 105 08/26/2012 2035   CO2 27 07/29/2021 1359   CO2 29 08/26/2012 2035   GLUCOSE 85 07/29/2021 1359   GLUCOSE 97 08/26/2012 2035   BUN 18 07/29/2021 1359   BUN 13 12/21/2017 1017   BUN 13 08/26/2012 2035   CREATININE 0.86 07/29/2021 1359   CALCIUM 10.4 07/29/2021 1359   CALCIUM 9.9 08/26/2012 2035   GFRNONAA 66 09/11/2020 1147   GFRAA 76 09/11/2020 1147    Lipid Panel     Component Value Date/Time   CHOL 209 (H) 07/29/2021 1359   CHOL 181 12/21/2017 1017   TRIG 191 (H) 07/29/2021 1359   HDL 54 07/29/2021 1359   HDL 50 12/21/2017 1017   CHOLHDL 3.9 07/29/2021 1359   LDLCALC 124 (H) 07/29/2021 1359    CBC    Component Value Date/Time   WBC 6.6 07/29/2021 1359   RBC 4.85 07/29/2021 1359   HGB 15.8 (H) 07/29/2021 1359   HGB 14.8 12/21/2017 1017   HCT 46.9 (H) 07/29/2021 1359   HCT 42.5 12/21/2017 1017   PLT 110 (L) 07/29/2021 1359   PLT 141 (L) 12/21/2017 1017   MCV 96.7 07/29/2021 1359   MCV 93 12/21/2017 1017   MCV 92 08/26/2012 2035   MCH 32.6 07/29/2021 1359   MCHC 33.7 07/29/2021 1359   RDW 13.1 07/29/2021 1359   RDW 12.8 12/21/2017 1017   RDW 13.1 08/26/2012 2035   LYMPHSABS 2,379 06/10/2021 1032   LYMPHSABS 2.5 12/21/2017 1017   EOSABS 101 06/10/2021 1032   EOSABS 0.3 12/21/2017 1017   BASOSABS 67 06/10/2021 1032   BASOSABS 0.0 12/21/2017 1017    Hgb A1C Lab Results  Component Value Date   HGBA1C 5.3 05/08/2021           Assessment & Plan:   Sore Throat:  Rapid strep negative Depo-Medrol 80 mg IM x1 Can do salt water  gargles as needed for sore throat Can take Ibuprofen OTC as needed  RTC in 1 month for annual exam [name], NP This visit occurred during the SARS-CoV-2 public health emergency.  Safety protocols were in place, including screening questions prior to the visit, additional usage of staff PPE, and extensive cleaning of exam room while observing appropriate contact time as indicated for disinfecting solutions.

## 2021-11-05 ENCOUNTER — Other Ambulatory Visit: Payer: Self-pay | Admitting: Internal Medicine

## 2021-11-05 DIAGNOSIS — F419 Anxiety disorder, unspecified: Secondary | ICD-10-CM

## 2021-11-05 NOTE — Telephone Encounter (Signed)
Requested Prescriptions  Pending Prescriptions Disp Refills   losartan (COZAAR) 100 MG tablet [Pharmacy Med Name: LOSARTAN 100MG  TABLETS] 90 tablet 0    Sig: TAKE 1 TABLET(100 MG) BY MOUTH DAILY     Cardiovascular:  Angiotensin Receptor Blockers Failed - 11/05/2021 11:06 AM      Failed - Last BP in normal range    BP Readings from Last 1 Encounters:  10/23/21 (!) 148/66         Passed - Cr in normal range and within 180 days    Creat  Date Value Ref Range Status  07/29/2021 0.86 0.60 - 1.00 mg/dL Final         Passed - K in normal range and within 180 days    Potassium  Date Value Ref Range Status  07/29/2021 4.0 3.5 - 5.3 mmol/L Final  08/26/2012 3.3 (L) 3.5 - 5.1 mmol/L Final         Passed - Patient is not pregnant      Passed - Valid encounter within last 6 months    Recent Outpatient Visits          1 week ago Sore throat   [facility] [place], Coralie Keens, NP   1 month ago Acute right-sided low back pain without sciatica   [facility] [place], Coralie Keens, NP   2 months ago Frequent headaches   [facility] [place], Coralie Keens, NP   2 months ago Postmenopausal estrogen deficiency   [facility] [place], Coralie Keens, NP   4 months ago Aortic atherosclerosis [facility])   [facility], Coralie Keens, NP      Future Appointments            In 3 months [place], Coralie Keens, NP [facility]            amLODipine ([place]) 5 MG tablet [Pharmacy Med Name: AMLODIPINE BESYLATE 5MG  TABLETS] 90 tablet 0    Sig: TAKE 1 TABLET(5 MG) BY MOUTH DAILY     Cardiovascular: Calcium Channel Blockers 2 Failed - 11/05/2021 11:06 AM      Failed - Last BP in normal range    BP Readings from Last 1 Encounters:  10/23/21 (!) 148/66         Passed - Last Heart Rate in normal range    Pulse Readings from Last 1 Encounters:  10/23/21 62         Passed - Valid encounter within last 6 months    Recent  Outpatient Visits          1 week ago Sore throat   [facility] [place], [place] W, NP   1 month ago Acute right-sided low back pain without sciatica   [place], Coralie Keens, NP   2 months ago Frequent headaches   [facility] [place], Coralie Keens, NP   2 months ago Postmenopausal estrogen deficiency   [place], Coralie Keens, NP   4 months ago Aortic atherosclerosis [facility])   [facility], Coralie Keens, NP      Future Appointments            In 3 months [place], Coralie Keens, NP [facility], [facility]            citalopram (CELEXA) 20 MG tablet [Pharmacy Med Name: CITALOPRAM 20MG  TABLETS] 90 tablet 0  Sig: TAKE 1 TABLET(20 MG) BY MOUTH DAILY     Psychiatry:  Antidepressants - SSRI Passed - 11/05/2021 11:06 AM      Passed - Valid encounter within last 6 months    Recent Outpatient Visits          1 week ago Sore throat   [facility] [place], [place] W, NP   1 month ago Acute right-sided low back pain without sciatica   [facility] [place], Coralie Keens, NP   2 months ago Frequent headaches   [facility] [place], Coralie Keens, NP   2 months ago Postmenopausal estrogen deficiency   [facility] [place], Coralie Keens, NP   4 months ago Aortic atherosclerosis [facility])   [facility], Coralie Keens, NP      Future Appointments            In 3 months [place], Coralie Keens, NP [facility], [facility]

## 2021-11-12 ENCOUNTER — Other Ambulatory Visit: Payer: PPO

## 2021-11-13 ENCOUNTER — Other Ambulatory Visit: Payer: Self-pay | Admitting: Internal Medicine

## 2021-11-13 DIAGNOSIS — F5104 Psychophysiologic insomnia: Secondary | ICD-10-CM

## 2021-11-14 NOTE — Telephone Encounter (Signed)
Requested Prescriptions  Pending Prescriptions Disp Refills   traZODone (DESYREL) 50 MG tablet [Pharmacy Med Name: TRAZODONE 50MG  TABLETS] 90 tablet 0    Sig: TAKE 1 TABLET(50 MG) BY MOUTH AT BEDTIME AS NEEDED FOR SLEEP     Psychiatry: Antidepressants - Serotonin Modulator Passed - 11/13/2021  8:21 PM      Passed - Valid encounter within last 6 months    Recent Outpatient Visits          3 weeks ago Sore throat   [facility] [place], [place] W, NP   1 month ago Acute right-sided low back pain without sciatica   [facility] [place], Coralie Keens, NP   2 months ago Frequent headaches   [facility] [place], Coralie Keens, NP   2 months ago Postmenopausal estrogen deficiency   [facility] [place], Coralie Keens, NP   4 months ago Aortic atherosclerosis [facility])   [facility], Coralie Keens, NP      Future Appointments            In 3 months [place], Coralie Keens, NP [facility], [facility]

## 2021-11-25 ENCOUNTER — Ambulatory Visit (INDEPENDENT_AMBULATORY_CARE_PROVIDER_SITE_OTHER): Payer: PPO | Admitting: Pharmacist

## 2021-11-25 DIAGNOSIS — I10 Essential (primary) hypertension: Secondary | ICD-10-CM

## 2021-11-25 NOTE — Chronic Care Management (AMB) (Signed)
? ?Chronic Care Management ?CCM Pharmacy Note ? ?11/25/2021 ?Name:  ALICIA SEIB MRN:  [id number] DOB:  1941-04-14 ? ? ?Subjective: ?KATRINE RADICH is an 81 y.o. year old female who is a primary patient of [name], NP.  The CCM team was consulted for assistance with disease management and care coordination needs.   ? ?Engaged with patient by telephone for follow up visit for pharmacy case management and/or care coordination services.  ? ?Objective: ? ?Medications Reviewed Today   ? ? Reviewed by [name], NP (Nurse Practitioner) on 10/23/21 at [phone]  Med List Status: <None>  ? ?Medication Order Taking? Sig Documenting Provider Last Dose Status Informant  ?acetaminophen (TYLENOL) 500 MG tablet [id number] Yes Take 500 mg by mouth every 6 (six) hours as needed for moderate pain.  [provider] Taking Active Self  ?amLODipine (NORVASC) 5 MG tablet [id number] Yes TAKE 1 TABLET(5 MG) BY MOUTH DAILY [place], Coralie Keens, NP Taking Active   ?atorvastatin (LIPITOR) 10 MG tablet [id number] Yes TAKE 1 TABLET(10 MG) BY MOUTH DAILY [place], Coralie Keens, NP Taking Active   ?cetirizine (ZYRTEC) 10 MG tablet [id number] Yes TAKE 1 TABLET(10 MG) BY MOUTH DAILY [place], Coralie Keens, NP Taking Active   ?citalopram (CELEXA) 20 MG tablet [id number] Yes Take 1 tablet (20 mg total) by mouth daily. [name], NP Taking Active   ?levothyroxine (SYNTHROID) 150 MCG tablet [id number] Yes TAKE 1 TABLET(150 MCG) BY MOUTH DAILY Garnette Gunner Coralie Keens, NP Taking Active   ?losartan (COZAAR) 100 MG tablet [id number] Yes Take 1 tablet (100 mg total) by mouth daily. [name], NP Taking Active   ?metoprolol succinate (TOPROL-XL) 50 MG 24 hr tablet [id number]  Take with or immediately following a meal. [name], NP  Active   ?Multiple Vitamin (MULTIVITAMIN) capsule [id number] Yes Take 1 capsule by mouth daily. [provider] Taking Active   ?rOPINIRole (REQUIP) 0.5 MG tablet [id number] Yes TAKE 1 TABLET(0.5 MG) BY MOUTH AT  BEDTIME [name], NP Taking Active   ?tizanidine (ZANAFLEX) 2 MG capsule [id number] Yes Take 1 capsule (2 mg total) by mouth at bedtime as needed for muscle spasms. [name], NP Taking Active   ?traZODone (DESYREL) 50 MG tablet [id number] Yes Take 1 tablet (50 mg total) by mouth at bedtime as needed for sleep. [name], NP Taking Active   ? ?  ?  ? ?  ? ? ?Pertinent Labs:  ? ?Lab Results  ?Component Value Date  ? CREATININE 0.86 07/29/2021  ? BUN 18 07/29/2021  ? NA 144 07/29/2021  ? K 4.0 07/29/2021  ? CL 108 07/29/2021  ? CO2 27 07/29/2021  ? ?BP Readings from Last 3 Encounters:  ?10/23/21 (!) 148/66  ?10/07/21 (!) 141/70  ?09/19/21 (!) 142/72  ? ?Pulse Readings from Last 3 Encounters:  ?10/23/21 62  ?10/07/21 (!) 59  ?09/19/21 (!) 57  ? ? ? ?SDOH:  (Social Determinants of Health) assessments and interventions performed:  ? ? ?CCM Care Plan ? ?Review of patient past medical history, allergies, medications, health status, including review of consultants reports, laboratory and other test data, was performed as part of comprehensive evaluation and provision of chronic care management services.  ? ?Care Plan : PharmD - Medication Management; HTN  ?Updates made by Rennis Petty, RPH-CPP since 11/25/2021 12:00 AM  ?  ? ?Problem: Disease Progression   ?  ? ?Long-Range Goal: Disease Progression Prevented or Minimized   ?Start Date:  07/01/2021  ?Expected End Date: 09/29/2021  ?Recent Progress: On track  ?Priority: High  ?Note:   ?Current Barriers:  ?Unable to achieve control of hypertension  ? ?Pharmacist Clinical Goal(s):  ?patient will achieve adherence to monitoring guidelines and medication adherence to achieve therapeutic efficacy ?achieve control of hypertension as evidenced by blood pressure readings  through collaboration with PharmD and provider.  ? ? ?Interventions: ?1:1 collaboration with [name], NP regarding development and update of comprehensive plan of care as evidenced by  provider attestation and co-signature ?Inter-disciplinary care team collaboration (see longitudinal plan of care) ?Perform chart review. Patient seen for Office Visit with PCP on 10/23/2021 for sore throat ?Today reports has had soreness under her chin for ~ 1 week. States it's not painful, but notices that is tender especially if moves head and would like to have PCP examine it ?States will call office today to schedule appointment with PCP ? ?Hypertension:   ?Current treatment: ?Amlodipine 5 mg daily ?Metoprolol ER 50 mg QAM ?Losartan 100 mg daily ?Patient's home monitor is broken. Again encourage patient to obtain new upper arm blood pressure monitor ?Denies hypertensive symptoms ?Counsel patient on home BP monitoring technique ?Counsel patient once obtain new monitor, to start monitoring home BP, to keep a log of the results, have this record for [place] to review during telephone appointment, but to call office sooner or readings outside of established parameter or for new/worsening symptoms ? ?Medication Adherence:  ?Patient uses weekly pillbox to organize her medications ?Reports rarely misses a dose ?Patient has denied interest in using pill packaging ?Encourage patient to continue to use weekly pillbox. Have advised patient to also use medication list from latest office visit with PCP when fills pillbox to confirm medications and dosages ? ?Tobacco Use: ?Counsel patient on benefits of smoking cessation ?Reports has reduced smoking to ~10 cigarettes/day ?States she enjoys smoking. Not ready to quit at this time, but working on cutting back ?Current strategies: does not smoke in the house, puts out cigarette before finishing ? ?Patient Goals/Self-Care Activities ?patient will:  ?- take medications as prescribed ?- check blood pressure, document, and provide at future appointments ? ?Follow Up Plan: Telephone follow up appointment with care management team member scheduled for: 12/23/2021 at 1:45 PM ? ?  ?   ? ?Wallace Cullens, PharmD, BCACP, CPP ?Clinical Pharmacist ?[facility] ?[place] ?[phone] ? ? ? ? ? ?

## 2021-11-25 NOTE — Patient Instructions (Signed)
Visit Information ? ?Thank you for taking time to visit with me today. Please don't hesitate to contact me if I can be of assistance to you before our next scheduled telephone appointment. ? ?Following are the goals we discussed today:  ? Goals Addressed   ? ?  ?  ?  ?  ? This Visit's Progress  ?  Pharmacy Goals     ?  Please obtain new upper arm blood pressure monitor and restart checking your home blood pressure, keep a log of the results and bring this with you to your medical appointments. ? ?Feel free to call me with any questions or concerns. I look forward to our next call! ? ? ?Wallace Cullens, PharmD, BCACP, CPP ?Clinical Pharmacist ?[facility] ?[place] ?[phone] ?  ? ?  ? ? ? ?Our next appointment is by telephone on 12/23/2021 at 1:45 PM ? ?Please call the care guide team at [phone] if you need to cancel or reschedule your appointment.  ? ? ?Patient verbalizes understanding of instructions and care plan provided today and agrees to view in [place]. Active MyChart status confirmed with patient.   ? ?

## 2021-11-26 ENCOUNTER — Ambulatory Visit (INDEPENDENT_AMBULATORY_CARE_PROVIDER_SITE_OTHER): Payer: PPO | Admitting: Internal Medicine

## 2021-11-26 ENCOUNTER — Other Ambulatory Visit: Payer: Self-pay

## 2021-11-26 ENCOUNTER — Encounter: Payer: Self-pay | Admitting: Internal Medicine

## 2021-11-26 VITALS — BP 153/64 | HR 63 | Temp 96.9°F | Wt 166.0 lb

## 2021-11-26 DIAGNOSIS — I10 Essential (primary) hypertension: Secondary | ICD-10-CM | POA: Diagnosis not present

## 2021-11-26 DIAGNOSIS — R221 Localized swelling, mass and lump, neck: Secondary | ICD-10-CM | POA: Diagnosis not present

## 2021-11-26 DIAGNOSIS — E039 Hypothyroidism, unspecified: Secondary | ICD-10-CM | POA: Diagnosis not present

## 2021-11-26 DIAGNOSIS — E663 Overweight: Secondary | ICD-10-CM

## 2021-11-26 DIAGNOSIS — Z6829 Body mass index (BMI) 29.0-29.9, adult: Secondary | ICD-10-CM | POA: Diagnosis not present

## 2021-11-26 MED ORDER — AMLODIPINE BESYLATE 10 MG PO TABS: 10.0000 mg | ORAL_TABLET | Freq: Every day | ORAL | 0 refills | Status: DC

## 2021-11-26 NOTE — Assessment & Plan Note (Signed)
TSH and free T4 today We will adjust levothyroxine if needed based on labs 

## 2021-11-26 NOTE — Patient Instructions (Signed)
How to Take Your Blood Pressure ?Blood pressure measures how strongly your blood is pressing against the walls of your arteries. Arteries are blood vessels that carry blood from your heart throughout your body. You can take your blood pressure at home with a machine. ?You may need to check your blood pressure at home: ?To check if you have high blood pressure (hypertension). ?To check your blood pressure over time. ?To make sure your blood pressure medicine is working. ?Supplies needed: ?Blood pressure machine, or monitor. ?Dining room chair to sit in. ?Table or desk. ?Small notebook. ?Pencil or pen. ?How to prepare ?Avoid these things for 30 minutes before checking your blood pressure: ?Having drinks with caffeine in them, such as coffee or tea. ?Drinking alcohol. ?Eating. ?Smoking. ?Exercising. ?Do these things five minutes before checking your blood pressure: ?Go to the bathroom and pee (urinate). ?Sit in a dining chair. Do not sit on a soft couch or an armchair. ?Be quiet. Do not talk. ?How to take your blood pressure ?Follow the instructions that came with your machine. If you have a digital blood pressure monitor, these may be the instructions: ?Sit up straight. ?Place your feet on the floor. Do not cross your ankles or legs. ?Rest your left arm at the level of your heart. You may rest it on a table, desk, or chair. ?Pull up your shirt sleeve. ?Wrap the blood pressure cuff around the upper part of your left arm. The cuff should be 1 inch (2.5 cm) above your elbow. It is best to wrap the cuff around bare skin. ?Fit the cuff snugly around your arm. You should be able to place only one finger between the cuff and your arm. ?Place the cord so that it rests in the bend of your elbow. ?Press the power button. ?Sit quietly while the cuff fills with air and loses air. ?Write down the numbers on the screen. ?Wait 2-3 minutes and then repeat steps 1-10. ?What do the numbers mean? ?Two numbers make up your blood  pressure. The first number is called systolic pressure. The second is called diastolic pressure. An example of a blood pressure reading is "120 over 80" (or 120/80). ?If you are an adult and do not have a medical condition, use this guide to find out if your blood pressure is normal: ?Normal ?First number: below 120. ?Second number: below 80. ?Elevated ?First number: 120-129. ?Second number: below 80. ?Hypertension stage 1 ?First number: 130-139. ?Second number: 80-89. ?Hypertension stage 2 ?First number: 140 or above. ?Second number: 90 or above. ?Your blood pressure is above normal even if only the first or only the second number is above normal. ?Follow these instructions at home: ?Medicines ?Take over-the-counter and prescription medicines only as told by your doctor. ?Tell your doctor if your medicine is causing side effects. ?General instructions ?Check your blood pressure as often as your doctor tells you to. ?Check your blood pressure at the same time every day. ?Take your monitor to your next doctor's appointment. Your doctor will: ?Make sure you are using it correctly. ?Make sure it is working right. ?Understand what your blood pressure numbers should be. ?Keep all follow-up visits as told by your doctor. This is important. ?General tips ?You will need a blood pressure machine, or monitor. Your doctor can suggest a monitor. You can buy one at a drugstore or online. When choosing one: ?Choose one with an arm cuff. ?Choose one that wraps around your upper arm. Only one finger should fit between   your arm and the cuff. ?Do not choose one that measures your blood pressure from your wrist or finger. ?Where to find more information ?American Heart Association: www.heart.org ?Contact a doctor if: ?Your blood pressure keeps being high. ?Your blood pressure is suddenly low. ?Get help right away if: ?Your first blood pressure number is higher than 180. ?Your second blood pressure number is higher than  120. ?Summary ?Check your blood pressure at the same time every day. ?Avoid caffeine, alcohol, smoking, and exercise for 30 minutes before checking your blood pressure. ?Make sure you understand what your blood pressure numbers should be. ?This information is not intended to replace advice given to you by your health care provider. Make sure you discuss any questions you have with your health care provider. ?Document Revised: 07/18/2020 Document Reviewed: 09/02/2019 ?Elsevier Patient Education ? 2022 Elsevier Inc. ? ?

## 2021-11-26 NOTE — Assessment & Plan Note (Addendum)
Encourage diet and exercise for weight loss 

## 2021-11-26 NOTE — Assessment & Plan Note (Signed)
Elevated today ?Increase Amlodipine to 10 mg daily ?Continue Metoprolol and Losartan as previously prescribed ?Reinforced DASH diet and exercise for weight loss ? ?

## 2021-11-26 NOTE — Progress Notes (Signed)
Subjective:    Patient ID: [name], female    DOB: [birth year], 81 y.o.   MRN: [id number]  HPI  Pt presents to the clinic today with c/o a swelling on the right side of her neck.  She noticed this 1 week ago.  She reports the area is tender to touch.  She has not had any difficulty swallowing.  She has had surgery on that side to remove her salivary gland due to a stone.  She has not tried any medications OTC for this.  She does have hypothyroidism, currently managed on Levothyroxine.  She also reports head pressure.  She reports her head feels like a balloon and she feels slightly lightheaded.  Her BP today is 153/64.  She is taking Losartan, Metoprolol and Amlodipine as prescribed.  She denies vision changes or feelings of syncope.  Review of Systems     Past Medical History:  Diagnosis Date   Allergy    Anemia    Anxiety    Cancer ([place])    skin cancer   Hyperlipidemia    Hypertension    Hypothyroidism    Sciatica    Sleep apnea    does not use cpap   Thrombocytopenia ([facility])    Thyroid disease     Current Outpatient Medications  Medication Sig Dispense Refill   acetaminophen (TYLENOL) 500 MG tablet Take 500 mg by mouth every 6 (six) hours as needed for moderate pain.      amLODipine (NORVASC) 5 MG tablet TAKE 1 TABLET(5 MG) BY MOUTH DAILY 90 tablet 0   atorvastatin (LIPITOR) 10 MG tablet TAKE 1 TABLET(10 MG) BY MOUTH DAILY 90 tablet 1   cetirizine (ZYRTEC) 10 MG tablet TAKE 1 TABLET(10 MG) BY MOUTH DAILY 90 tablet 0   citalopram (CELEXA) 20 MG tablet TAKE 1 TABLET(20 MG) BY MOUTH DAILY 90 tablet 0   levothyroxine (SYNTHROID) 150 MCG tablet TAKE 1 TABLET(150 MCG) BY MOUTH DAILY 90 tablet 1   losartan (COZAAR) 100 MG tablet TAKE 1 TABLET(100 MG) BY MOUTH DAILY 90 tablet 0   metoprolol succinate (TOPROL-XL) 50 MG 24 hr tablet Take with or immediately following a meal. 90 tablet 1   Multiple Vitamin (MULTIVITAMIN) capsule Take 1 capsule by mouth daily.     rOPINIRole  (REQUIP) 0.5 MG tablet TAKE 1 TABLET(0.5 MG) BY MOUTH AT BEDTIME 90 tablet 0   tizanidine (ZANAFLEX) 2 MG capsule Take 1 capsule (2 mg total) by mouth at bedtime as needed for muscle spasms. 15 capsule 0   traZODone (DESYREL) 50 MG tablet TAKE 1 TABLET(50 MG) BY MOUTH AT BEDTIME AS NEEDED FOR SLEEP 90 tablet 0   No current facility-administered medications for this visit.    Allergies  Allergen Reactions   Codeine Nausea Only    Tolerates oxycodone   Morphine Nausea And Vomiting   Ace Inhibitors Cough   Adhesive [Tape] Other (See Comments) and Rash    whelps    Family History  Problem Relation Age of Onset   Dementia Mother    Dementia Sister    Heart attack Brother    Diabetes Brother    Breast cancer Paternal Aunt    Heart attack Son    Hypercalcemia Neg Hx    Cancer Neg Hx     Social History   Socioeconomic History   Marital status: Married    Spouse name: Not on file   Number of children: Not on file   Years of education: Not on  file   Highest education level: Not on file  Occupational History   Occupation: retired  Tobacco Use   Smoking status: Every Day    Packs/day: 0.50    Years: 40.00    Pack years: 20.00    Types: Cigarettes   Smokeless tobacco: Never  Vaping Use   Vaping Use: Never used  Substance and Sexual Activity   Alcohol use: No   Drug use: No   Sexual activity: Not Currently  Other Topics Concern   Not on file  Social History Narrative   Not on file   Social Determinants of Health   Financial Resource Strain: Low Risk    Difficulty of Paying Living Expenses: Not hard at all  Food Insecurity: No Food Insecurity   Worried About Charity fundraiser in the Last Year: Never true   [place] in the Last Year: Never true  Transportation Needs: No Transportation Needs   Lack of Transportation (Medical): No   Lack of Transportation (Non-Medical): No  Physical Activity: Inactive   Days of Exercise per Week: 0 days   Minutes of  Exercise per Session: 0 min  Stress: No Stress Concern Present   Feeling of Stress : Not at all  Social Connections: Moderately Isolated   Frequency of Communication with Friends and Family: More than three times a week   Frequency of Social Gatherings with Friends and Family: More than three times a week   Attends Religious Services: Never   Marine scientist or Organizations: No   Attends Music therapist: Never   Marital Status: Married  Human resources officer Violence: Not At Risk   Fear of Current or Ex-Partner: No   Emotionally Abused: No   Physically Abused: No   Sexually Abused: No     Constitutional: Patient reports headache.  Denies fever, malaise, fatigue, or abrupt weight changes.  HEENT: Denies eye pain, eye redness, ear pain, ringing in the ears, wax buildup, runny nose, nasal congestion, bloody nose, or sore throat. Respiratory: Denies difficulty breathing, shortness of breath, cough or sputum production.   Cardiovascular: Denies chest pain, chest tightness, palpitations or swelling in the hands or feet.  Skin: Pt reports swelling in the right side of neck.  Denies redness, rashes, or ulcercations.  Neurological: Patient reports in the lightheadedness.  Denies difficulty with memory, difficulty with speech or problems with balance and coordination.    No other specific complaints in a complete review of systems (except as listed in HPI above).  Objective:   Physical Exam   BP (!) 153/64 (BP Location: Right Arm, Patient Position: Sitting, Cuff Size: Large)    Pulse 63    Temp (!) 96.9 F (36.1 C) (Temporal)    Wt 166 lb (75.3 kg)    LMP  (LMP Unknown)    SpO2 99%    BMI 29.41 kg/m   Wt Readings from Last 3 Encounters:  10/23/21 170 lb (77.1 kg)  10/07/21 168 lb (76.2 kg)  09/19/21 168 lb 3.2 oz (76.3 kg)    General: Appears her stated age, overweight, in NAD. Skin: Warm, dry and intact. No rashes noted. HEENT: Head: normal shape and size; Eyes:  sclera white and EOMs intact;  Neck: Fullness noted of the right side of the neck. Neck supple, trachea midline.  Cardiovascular: Normal rate and rhythm. S1,S2 noted.  No murmur, rubs or gallops noted. Pulmonary/Chest: Normal effort and positive vesicular breath sounds. No respiratory distress. No wheezes, rales or  ronchi noted.  Musculoskeletal:  No difficulty with gait.  Neurological: Alert and oriented.  Trouble with recall.  Coordination normal.    BMET    Component Value Date/Time   NA 144 07/29/2021 1359   NA 145 (H) 12/21/2017 1017   NA 139 08/26/2012 2035   K 4.0 07/29/2021 1359   K 3.3 (L) 08/26/2012 2035   CL 108 07/29/2021 1359   CL 105 08/26/2012 2035   CO2 27 07/29/2021 1359   CO2 29 08/26/2012 2035   GLUCOSE 85 07/29/2021 1359   GLUCOSE 97 08/26/2012 2035   BUN 18 07/29/2021 1359   BUN 13 12/21/2017 1017   BUN 13 08/26/2012 2035   CREATININE 0.86 07/29/2021 1359   CALCIUM 10.4 07/29/2021 1359   CALCIUM 9.9 08/26/2012 2035   GFRNONAA 66 09/11/2020 1147   GFRAA 76 09/11/2020 1147    Lipid Panel     Component Value Date/Time   CHOL 209 (H) 07/29/2021 1359   CHOL 181 12/21/2017 1017   TRIG 191 (H) 07/29/2021 1359   HDL 54 07/29/2021 1359   HDL 50 12/21/2017 1017   CHOLHDL 3.9 07/29/2021 1359   LDLCALC 124 (H) 07/29/2021 1359    CBC    Component Value Date/Time   WBC 6.6 07/29/2021 1359   RBC 4.85 07/29/2021 1359   HGB 15.8 (H) 07/29/2021 1359   HGB 14.8 12/21/2017 1017   HCT 46.9 (H) 07/29/2021 1359   HCT 42.5 12/21/2017 1017   PLT 110 (L) 07/29/2021 1359   PLT 141 (L) 12/21/2017 1017   MCV 96.7 07/29/2021 1359   MCV 93 12/21/2017 1017   MCV 92 08/26/2012 2035   MCH 32.6 07/29/2021 1359   MCHC 33.7 07/29/2021 1359   RDW 13.1 07/29/2021 1359   RDW 12.8 12/21/2017 1017   RDW 13.1 08/26/2012 2035   LYMPHSABS 2,379 06/10/2021 1032   LYMPHSABS 2.5 12/21/2017 1017   EOSABS 101 06/10/2021 1032   EOSABS 0.3 12/21/2017 1017   BASOSABS 67 06/10/2021  1032   BASOSABS 0.0 12/21/2017 1017    Hgb A1C Lab Results  Component Value Date   HGBA1C 5.3 05/08/2021          Assessment & Plan:   Mass of Right Side of Neck:  We will obtain ultrasound soft tissue head and neck for further evaluation   We will follow-up after labs and imaging with further recommendation and treatment plan, RTC in 1 week for follow-up BP  [name], NP This visit occurred during the SARS-CoV-2 public health emergency.  Safety protocols were in place, including screening questions prior to the visit, additional usage of staff PPE, and extensive cleaning of exam room while observing appropriate contact time as indicated for disinfecting solutions.

## 2021-11-27 LAB — TSH: TSH: 1.08 mIU/L (ref 0.40–4.50)

## 2021-11-27 LAB — T4, FREE: Free T4: 1.3 ng/dL (ref 0.8–1.8)

## 2021-12-03 ENCOUNTER — Other Ambulatory Visit: Payer: Self-pay

## 2021-12-03 ENCOUNTER — Encounter: Payer: Self-pay | Admitting: Internal Medicine

## 2021-12-03 ENCOUNTER — Ambulatory Visit (INDEPENDENT_AMBULATORY_CARE_PROVIDER_SITE_OTHER): Payer: PPO | Admitting: Internal Medicine

## 2021-12-03 VITALS — BP 124/68 | HR 58 | Temp 96.9°F | Wt 166.0 lb

## 2021-12-03 DIAGNOSIS — R42 Dizziness and giddiness: Secondary | ICD-10-CM

## 2021-12-03 DIAGNOSIS — E663 Overweight: Secondary | ICD-10-CM

## 2021-12-03 DIAGNOSIS — G44221 Chronic tension-type headache, intractable: Secondary | ICD-10-CM

## 2021-12-03 DIAGNOSIS — Z6829 Body mass index (BMI) 29.0-29.9, adult: Secondary | ICD-10-CM

## 2021-12-03 DIAGNOSIS — I10 Essential (primary) hypertension: Secondary | ICD-10-CM

## 2021-12-03 MED ORDER — AMLODIPINE BESYLATE 10 MG PO TABS: 10.0000 mg | ORAL_TABLET | Freq: Every day | ORAL | 1 refills | Status: DC

## 2021-12-03 NOTE — Assessment & Plan Note (Signed)
Controlled on Amlodipine, Losartan and Metoprolol ?Reinforced DASH diet and exercise for weight loss ?We will monitor ?

## 2021-12-03 NOTE — Assessment & Plan Note (Signed)
Encourage diet and exercise for weight loss 

## 2021-12-03 NOTE — Progress Notes (Signed)
? ?Subjective:  ? ? Patient ID: [name], female    DOB: [birth year], 81 y.o.   MRN: [id number] ? ?HPI ? ?Patient presents to clinic today for 1 week follow-up for BP check.  At her last visit, her Amlodipine was increased to 10 mg in addition to continuing her Losartan and Metoprolol.  She has been taking the medication as prescribed.  Her BP today is 124/78.  ECG from 05/2019 reviewed.  She reports her head still feels heavy and she has intermittent lightheadedness.  She denies syncope, vision changes, chest pain, chest tightness or shortness of breath.  She really feels like this is due to her thyroid.  Her thyroid studies were normal.  She has a thyroid ultrasound scheduled for tomorrow. ? ?Review of Systems ? ?Past Medical History:  ?Diagnosis Date  ? Allergy   ? Anemia   ? Anxiety   ? Cancer [facility])   ? skin cancer  ? Hyperlipidemia   ? Hypertension   ? Hypothyroidism   ? Sciatica   ? Sleep apnea   ? does not use cpap  ? Thrombocytopenia ([place])   ? Thyroid disease   ? ? ?Current Outpatient Medications  ?Medication Sig Dispense Refill  ? acetaminophen (TYLENOL) 500 MG tablet Take 500 mg by mouth every 6 (six) hours as needed for moderate pain.     ? amLODipine (NORVASC) 10 MG tablet Take 1 tablet (10 mg total) by mouth daily. 30 tablet 0  ? atorvastatin (LIPITOR) 10 MG tablet TAKE 1 TABLET(10 MG) BY MOUTH DAILY 90 tablet 1  ? cetirizine (ZYRTEC) 10 MG tablet TAKE 1 TABLET(10 MG) BY MOUTH DAILY 90 tablet 0  ? citalopram (CELEXA) 20 MG tablet TAKE 1 TABLET(20 MG) BY MOUTH DAILY 90 tablet 0  ? levothyroxine (SYNTHROID) 150 MCG tablet TAKE 1 TABLET(150 MCG) BY MOUTH DAILY 90 tablet 1  ? losartan (COZAAR) 100 MG tablet TAKE 1 TABLET(100 MG) BY MOUTH DAILY 90 tablet 0  ? metoprolol succinate (TOPROL-XL) 50 MG 24 hr tablet Take with or immediately following a meal. 90 tablet 1  ? Multiple Vitamin (MULTIVITAMIN) capsule Take 1 capsule by mouth daily.    ? rOPINIRole (REQUIP) 0.5 MG tablet TAKE 1 TABLET(0.5 MG) BY  MOUTH AT BEDTIME 90 tablet 0  ? tizanidine (ZANAFLEX) 2 MG capsule Take 1 capsule (2 mg total) by mouth at bedtime as needed for muscle spasms. 15 capsule 0  ? traZODone (DESYREL) 50 MG tablet TAKE 1 TABLET(50 MG) BY MOUTH AT BEDTIME AS NEEDED FOR SLEEP 90 tablet 0  ? ?No current facility-administered medications for this visit.  ? ? ?Allergies  ?Allergen Reactions  ? Codeine Nausea Only  ?  Tolerates oxycodone  ? Morphine Nausea And Vomiting  ? Ace Inhibitors Cough  ? Adhesive [Tape] Other (See Comments) and Rash  ?  whelps  ? ? ?Family History  ?Problem Relation Age of Onset  ? Dementia Mother   ? Dementia Sister   ? Heart attack Brother   ? Diabetes Brother   ? Breast cancer Paternal Aunt   ? Heart attack Son   ? Hypercalcemia Neg Hx   ? Cancer Neg Hx   ? ? ?Social History  ? ?Socioeconomic History  ? Marital status: Married  ?  Spouse name: Not on file  ? Number of children: Not on file  ? Years of education: Not on file  ? Highest education level: Not on file  ?Occupational History  ? Occupation: retired  ?Tobacco  Use  ? Smoking status: Every Day  ?  Packs/day: 0.50  ?  Years: 40.00  ?  Pack years: 20.00  ?  Types: Cigarettes  ? Smokeless tobacco: Never  ?Vaping Use  ? Vaping Use: Never used  ?Substance and Sexual Activity  ? Alcohol use: No  ? Drug use: No  ? Sexual activity: Not Currently  ?Other Topics Concern  ? Not on file  ?Social History Narrative  ? Not on file  ? ?Social Determinants of Health  ? ?Financial Resource Strain: Low Risk   ? Difficulty of Paying Living Expenses: Not hard at all  ?Food Insecurity: No Food Insecurity  ? Worried About Charity fundraiser in the Last Year: Never true  ? Ran Out of Food in the Last Year: Never true  ?Transportation Needs: No Transportation Needs  ? Lack of Transportation (Medical): No  ? Lack of Transportation (Non-Medical): No  ?Physical Activity: Inactive  ? Days of Exercise per Week: 0 days  ? Minutes of Exercise per Session: 0 min  ?Stress: No Stress Concern  Present  ? Feeling of Stress : Not at all  ?Social Connections: Moderately Isolated  ? Frequency of Communication with Friends and Family: More than three times a week  ? Frequency of Social Gatherings with Friends and Family: More than three times a week  ? Attends Religious Services: Never  ? Active Member of Clubs or Organizations: No  ? Attends Archivist Meetings: Never  ? Marital Status: Married  ?Intimate Partner Violence: Not At Risk  ? Fear of Current or Ex-Partner: No  ? Emotionally Abused: No  ? Physically Abused: No  ? Sexually Abused: No  ? ? ? ?Constitutional: Patient reports headache.  Denies fever, malaise, fatigue, or abrupt weight changes.  ?HEENT: Denies eye pain, eye redness, ear pain, ringing in the ears, wax buildup, runny nose, nasal congestion, bloody nose, or sore throat. ?Respiratory: Denies difficulty breathing, shortness of breath, cough or sputum production.   ?Cardiovascular: Denies chest pain, chest tightness, palpitations or swelling in the hands or feet.  ?Neurological: Patient reports intermittent lightheadedness, has difficulty with memory.  Denies dizziness, difficulty with speech or problems with balance and coordination.  ? ? ?No other specific complaints in a complete review of systems (except as listed in HPI above). ? ?   ?Objective:  ? Physical Exam ?Blood pressure 124/68, pulse (!) 58, temperature (!) 96.9 ?F (36.1 ?C), temperature source Temporal, weight 166 lb (75.3 kg), SpO2 100 %. ? ?Wt Readings from Last 3 Encounters:  ?11/26/21 166 lb (75.3 kg)  ?10/23/21 170 lb (77.1 kg)  ?10/07/21 168 lb (76.2 kg)  ? ? ?General: Appears her stated age, in NAD. ?HEENT: Head: normal shape and size; Eyes: PERRLA and EOMs intact;  ?Neck:  Neck supple, trachea midline. No masses, lumps or thyromegaly present.  ?Cardiovascular: Bradycardic with normal rhythm. S1,S2 noted.  No murmur, rubs or gallops noted.  No carotid bruits noted. ?Pulmonary/Chest: Normal effort and positive  vesicular breath sounds. No respiratory distress. No wheezes, rales or ronchi noted.  ?Musculoskeletal: No difficulty with gait.  ? ? ?BMET ?   ?Component Value Date/Time  ? NA 144 07/29/2021 1359  ? NA 145 (H) 12/21/2017 1017  ? NA 139 08/26/2012 2035  ? K 4.0 07/29/2021 1359  ? K 3.3 (L) 08/26/2012 2035  ? CL 108 07/29/2021 1359  ? CL 105 08/26/2012 2035  ? CO2 27 07/29/2021 1359  ? CO2 29 08/26/2012 2035  ?  GLUCOSE 85 07/29/2021 1359  ? GLUCOSE 97 08/26/2012 2035  ? BUN 18 07/29/2021 1359  ? BUN 13 12/21/2017 1017  ? BUN 13 08/26/2012 2035  ? CREATININE 0.86 07/29/2021 1359  ? CALCIUM 10.4 07/29/2021 1359  ? CALCIUM 9.9 08/26/2012 2035  ? GFRNONAA 66 09/11/2020 1147  ? GFRAA 76 09/11/2020 1147  ? ? ?Lipid Panel  ?   ?Component Value Date/Time  ? CHOL 209 (H) 07/29/2021 1359  ? CHOL 181 12/21/2017 1017  ? TRIG 191 (H) 07/29/2021 1359  ? HDL 54 07/29/2021 1359  ? HDL 50 12/21/2017 1017  ? CHOLHDL 3.9 07/29/2021 1359  ? LDLCALC 124 (H) 07/29/2021 1359  ? ? ?CBC ?   ?Component Value Date/Time  ? WBC 6.6 07/29/2021 1359  ? RBC 4.85 07/29/2021 1359  ? HGB 15.8 (H) 07/29/2021 1359  ? HGB 14.8 12/21/2017 1017  ? HCT 46.9 (H) 07/29/2021 1359  ? HCT 42.5 12/21/2017 1017  ? PLT 110 (L) 07/29/2021 1359  ? PLT 141 (L) 12/21/2017 1017  ? MCV 96.7 07/29/2021 1359  ? MCV 93 12/21/2017 1017  ? MCV 92 08/26/2012 2035  ? MCH 32.6 07/29/2021 1359  ? MCHC 33.7 07/29/2021 1359  ? RDW 13.1 07/29/2021 1359  ? RDW 12.8 12/21/2017 1017  ? RDW 13.1 08/26/2012 2035  ? LYMPHSABS 2,379 06/10/2021 1032  ? LYMPHSABS 2.5 12/21/2017 1017  ? EOSABS 101 06/10/2021 1032  ? EOSABS 0.3 12/21/2017 1017  ? BASOSABS 67 06/10/2021 1032  ? BASOSABS 0.0 12/21/2017 1017  ? ? ?Hgb A1C ?Lab Results  ?Component Value Date  ? HGBA1C 5.3 05/08/2021  ? ? ? ? ? ? ? ?   ?Assessment & Plan:  ? ?Headache, Lightheadedness: ? ?Persistent despite good blood pressure control ?Consider MRI brain if thyroid ultrasound normal ? ?We will follow-up after imaging with further  recommendation and treatment plan ?[name], NP ?This visit occurred during the SARS-CoV-2 public health emergency.  Safety protocols were in place, including screening questions prior to the visit, a

## 2021-12-03 NOTE — Patient Instructions (Signed)
General Headache Without Cause ?A headache is pain or discomfort felt around the head or neck area. There are many causes and types of headaches. A few common types include: ?Tension headaches. ?Migraine headaches. ?Cluster headaches. ?Chronic daily headaches. ?Sometimes, the specific cause of a headache may not be found. ?Follow these instructions at home: ?Watch your condition for any changes. Let your health care provider know about them. Take these steps to help with your condition: ?Managing pain ?  ?Take over-the-counter and prescription medicines only as told by your health care provider. Treatment may include medicines for pain that are taken by mouth or applied to the skin. ?Lie down in a dark, quiet room when you have a headache. ?Keep lights dim if bright lights bother you or make your headaches worse. ?If directed, put ice on your head and neck area: ?Put ice in a plastic bag. ?Place a towel between your skin and the bag. ?Leave the ice on for 20 minutes, 2-3 times per day. ?Remove the ice if your skin turns bright red. This is very important. If you cannot feel pain, heat, or cold, you have a greater risk of damage to the area. ?If directed, apply heat to the affected area. Use the heat source that your health care provider recommends, such as a moist heat pack or a heating pad. ?Place a towel between your skin and the heat source. ?Leave the heat on for 20-30 minutes. ?Remove the heat if your skin turns bright red. This is especially important if you are unable to feel pain, heat, or cold. You have a greater risk of getting burned. ?Eating and drinking ?Eat meals on a regular schedule. ?If you drink alcohol: ?Limit how much you have to: ?0-1 drink a day for women who are not pregnant. ?0-2 drinks a day for men. ?Know how much alcohol is in a drink. In the [place] one drink equals one 12 oz bottle of beer (355 mL), one 5 oz glass of wine (148 mL), or one 1? oz glass of hard liquor (44 mL). ?Stop drinking  caffeine, or decrease the amount of caffeine you drink. ?Drink enough fluid to keep your urine pale yellow. ?General instructions ? ?Keep a headache journal to help find out what may trigger your headaches. For example, write down: ?What you eat and drink. ?How much sleep you get. ?Any change to your diet or medicines. ?Try massage or other relaxation techniques. ?Limit stress. ?Sit up straight, and do not tense your muscles. ?Do not use any products that contain nicotine or tobacco. These products include cigarettes, chewing tobacco, and vaping devices, such as e-cigarettes. If you need help quitting, ask your health care provider. ?Exercise regularly as told by your health care provider. ?Sleep on a regular schedule. Get 7-9 hours of sleep each night, or the amount recommended by your health care provider. ?Keep all follow-up visits. This is important. ?Contact a health care provider if: ?Medicine does not help your symptoms. ?You have a headache that is different from your usual headache. ?You have nausea or you vomit. ?You have a fever. ?Get help right away if: ?Your headache: ?Becomes severe quickly. ?Gets worse after moderate to intense physical activity. ?You have any of these symptoms: ?Repeated vomiting. ?Pain or stiffness in your neck. ?Changes to your vision. ?Pain in an eye or ear. ?Problems with speech. ?Muscular weakness or loss of muscle control. ?Loss of balance or coordination. ?You feel faint or pass out. ?You have confusion. ?You have a seizure. ?  These symptoms may represent a serious problem that is an emergency. Do not wait to see if the symptoms will go away. Get medical help right away. Call your local emergency services (911 in the [place] Do not drive yourself to the hospital. ?Summary ?A headache is pain or discomfort felt around the head or neck area. ?There are many causes and types of headaches. In some cases, the cause may not be found. ?Keep a headache journal to help find out what may  trigger your headaches. Watch your condition for any changes. Let your health care provider know about them. ?Contact a health care provider if you have a headache that is different from the usual headache, or if your symptoms are not helped by medicine. ?Get help right away if your headache becomes severe, you vomit, you have a loss of vision, you lose your balance, or you have a seizure. ?This information is not intended to replace advice given to you by your health care provider. Make sure you discuss any questions you have with your health care provider. ?Document Revised: 02/06/2021 Document Reviewed: 02/06/2021 ?Elsevier Patient Education ? [place]. ? ?

## 2021-12-04 ENCOUNTER — Ambulatory Visit
Admission: RE | Admit: 2021-12-04 | Discharge: 2021-12-04 | Disposition: A | Discharge status: Home / Self Care | Payer: PPO | Source: Ambulatory Visit | Attending: Internal Medicine | Admitting: Internal Medicine

## 2021-12-04 ENCOUNTER — Telehealth: Payer: Self-pay

## 2021-12-04 ENCOUNTER — Other Ambulatory Visit: Payer: Self-pay | Admitting: Internal Medicine

## 2021-12-04 DIAGNOSIS — R221 Localized swelling, mass and lump, neck: Secondary | ICD-10-CM | POA: Insufficient documentation

## 2021-12-04 NOTE — Telephone Encounter (Signed)
Copied from [place] [phone]. Topic: General - Other ?>> Dec 04, 2021  1:56 PM Yvette Rack wrote: ?Reason for CRM: Colletta Maryland with the ultrasound dept stated pt is there now but requests something different than what was sent over. Attempted to transfer her to the office but there was no answer. Cb# [phone] ?

## 2021-12-05 NOTE — Telephone Encounter (Signed)
I spoke with Colletta Maryland about pt's U/S ?

## 2021-12-09 ENCOUNTER — Ambulatory Visit: Payer: Self-pay

## 2021-12-09 ENCOUNTER — Telehealth: Payer: PPO

## 2021-12-09 ENCOUNTER — Ambulatory Visit: Payer: PPO

## 2021-12-09 DIAGNOSIS — F419 Anxiety disorder, unspecified: Secondary | ICD-10-CM

## 2021-12-09 DIAGNOSIS — D649 Anemia, unspecified: Secondary | ICD-10-CM

## 2021-12-09 DIAGNOSIS — E039 Hypothyroidism, unspecified: Secondary | ICD-10-CM

## 2021-12-09 DIAGNOSIS — E7800 Pure hypercholesterolemia, unspecified: Secondary | ICD-10-CM

## 2021-12-09 DIAGNOSIS — D696 Thrombocytopenia, unspecified: Secondary | ICD-10-CM

## 2021-12-09 DIAGNOSIS — I10 Essential (primary) hypertension: Secondary | ICD-10-CM

## 2021-12-09 NOTE — Patient Instructions (Signed)
Visit Information ? ?Thank you for taking time to visit with me today. Please don't hesitate to contact me if I can be of assistance to you before our next scheduled telephone appointment. ? ?Following are the goals we discussed today:  ?RNCM Clinical Goal(s):  ?Patient will verbalize understanding of plan for management of HTN, HLD, Anxiety, and thrombocytopenia and hypothyroidism  ?verbalize basic understanding of HTN, HLD, Anxiety, and hypothyroidism and thrombocytopenia  disease process and self health management plan  ?take all medications exactly as prescribed and will call provider for medication related questions ?demonstrate understanding of rationale for each prescribed medication  ?attend all scheduled medical appointments 02-19-2022 at 0820 am ?demonstrate improved and ongoing adherence to prescribed treatment plan for HTN, HLD, Anxiety, and thrombocytopenia and hypothyroidism  as evidenced by adherence to prescribed medication regimen contacting provider for new or worsened symptoms or questions  ?demonstrate improved and ongoing health management independence by taking medications as prescribed, calling the office for changes in conditions, questions, or new concerns ?continue to work with RN Care Manager to address care management and care coordination needs related to HTN, HLD, Anxiety, and thrombocytopenia, and hypothyroidism   ?work with pharmacist to address Cognitive Deficits and medication reconciliation and ongoing support related to HTN, HLD, Anxiety, and thrombocytopenia, and hyporthyroidism ?demonstrate a decrease in HTN, HLD, Anxiety, and hypothyroidism and thrombocytopenia exacerbations  ?demonstrate ongoing self health care management ability  through collaboration with RN Care manager, provider, and care team.  ?  ?Interventions: ?1:1 collaboration with primary care provider regarding development and update of comprehensive plan of care as evidenced by provider attestation and  co-signature ?Inter-disciplinary care team collaboration (see longitudinal plan of care) ?Evaluation of current treatment plan related to  self management and patient's adherence to plan as established by provider ?  ?  ?SDOH Barriers (Status: New goal. Goal on track: YES.)  ?Patient interviewed and SDOH assessment performed ?       ?SDOH Interventions   ?  ?Flowsheet Row Most Recent Value  ?SDOH Interventions    ?Food Insecurity Interventions Intervention Not Indicated  ?Financial Strain Interventions Intervention Not Indicated  ?Intimate Partner Violence Interventions Intervention Not Indicated  ?Physical Activity Interventions Other (Comments)  [no structured activity]  ?Stress Interventions Intervention Not Indicated  ?Social Connections Interventions Other (Comment)  [good support system]  ?Transportation Interventions Intervention Not Indicated  ?  ?   ?Patient interviewed and appropriate assessments performed ?Provided patient with information about CCM team and working with the patient to optimize health and well being ?Discussed plans with patient for ongoing care management follow up and provided patient with direct contact information for care management team ?Advised patient to call the office for changes in conditions, questions, or concerns about health and well being ?Collaborated with RN Case Manager re: working with the pharm D to assist with medications reconciliation and ongoing education and support of medications.  ?  ?  ?  ?Anemia/Bleeding( Thrombocytopenia)  (Status: Goal on Track (progressing): YES.) ?Assessment of understanding of anemia/bleeding disorder diagnosis. 10-07-2021: Long standing anemia with thrombocytopenia. Face to face visit with the patient today with extensive education on risk of bleeding and protecting herself. The patient denies nose bleeds, blood in urine or stool. Does have sporadic bruising on bilateral arms. Education provided that this is a normal finding with  patients with thrombocytopenia. Husband with the patient and reviewed risk and precautions with the husband as well. The patient ask good questions and just wanted reassurance. 12-09-2021: The patient  states she stays cold "all the time". Education provided and reassurance given. The patient states she is supposed to see oncology next month for further evaluation and treatment options. She states that she just does not like the way she feels sometimes. She is optimistic and was in her sunroom doing her puzzles at the time of the call. She denies any acute findings at this time. Will continue to monitor.  ?Basic overview and discussion of anemia/bleeding disorder or acute disease state. 12-09-2021: Review with educational information provided ?Review of most recent labs:  ?     ?Lab Results  ?Component Value Date  ?  WBC 6.6 07/29/2021  ?  HGB 15.8 (H) 07/29/2021  ?  HCT 46.9 (H) 07/29/2021  ?  MCV 96.7 07/29/2021  ?  PLT 110 (L) 07/29/2021  ?  ?Medications reviewed. 07-29-2021: Face to face visit with the patient review of medications. She is compliant with medications. Ask for assistance with getting a refill for her amlodipine 5 mg that does not have refills. Will collaborate with the pcp and pharm D. 10-07-2021: Review with the patient and submitted refill request for expressed needs. Collaboration between the pcp and CCM team.12-09-2021: The patient endorses compliance with medications.   ?- Counseled on bleeding risk associated with thrombocytopenia and importance of self-monitoring for signs/symptoms of bleeding. 07-29-2021: printed off material on thrombocytopenia and reviewed with the patient. The patient is having a hard time understanding what it means to have low platelets. Extensive education. The patient is having blood work today to be evaluated before her upcoming physical. 12-09-2021: Review and recommendations given. Will continue to monitor for gaps in knowledge on chronic disease processes and changes.   ?- Counseled on avoidance of NSAIDs due to increased bleeding risk, extensive education on avoiding NSAIDS. The patient ask if taking aspirin and ibuprofen were okay to take. The patient advised to avoid aspirin, ibuprofen, advil, aleve, naproxen and other medications classified as NSAIDS. Advised the patient if she had to take something for a headache to use Tylenol and not advil or ibuprofen. Used teach back method to enforce the need to not use aspirin or NSAIDS. Will also consult the pharm D for assistance with education on avoiding ASA and NSAIDS. 10-07-2021: Reviewed with the patient not to take NSAIDS as this puts her at higher risk of bleeding. The patient states she only takes Tylenol when she needs it. Extensive review with the patient and her husband on avoiding Aleve, Aspirin, ADVIL and other NSAID products. Questions answers and visual provided. 12-09-2021: Review and educational support provided. The patient does well with redirection and continued reminders of how to effectively manage her care.  ?- Counseled on importance of regular laboratory monitoring as directed by provider.  07-29-2021: Is having lab work today in the office . 12-09-2021: Has labwork on a regular basis; ?- Provided education about signs and symptoms of active bleeding such as stomach discomfort, coughing up blood or blood tinged secretions, bleeding from the gums/teeth, nosebleeds, increased bruising, blood in the urine/stool and/or if a traumatic injury occurs, regardless of severity of injury. 12-09-2021 Reviewed with the patient to call the office for changes in condition or noting any bleeding in urine, stool or more bruising than usual. ;  ?- Advised to call provider or 911 if active bleeding or signs and symptoms of active bleeding occur; ?- recommended promotion of rest and energy-conserving measures to manage fatigue, such as balancing activity with periods of rest ?- encouraged strategies to prevent  falls related to  fatigue, weakness and dizziness; encouraged sitting before standing and using an assistive device. 12-09-2021: Denies falls, is having some dizziness and light-headedness at times when changing position. Education and support

## 2021-12-09 NOTE — Chronic Care Management (AMB) (Signed)
?Chronic Care Management  ? ?CCM RN Visit Note ? ?12/09/2021 ?Name: [name] MRN: [id number] DOB: 1941-06-06 ? ?Subjective: ?[name] is a 81 y.o. year old female who is a primary care patient of [name], NP. The care management team was consulted for assistance with disease management and care coordination needs.   ? ?Engaged with patient by telephone for follow up visit in response to provider referral for case management and/or care coordination services.  ? ?Consent to Services:  ?The patient was given information about Chronic Care Management services, agreed to services, and gave verbal consent prior to initiation of services.  Please see initial visit note for detailed documentation.  ? ?Patient agreed to services and verbal consent obtained.  ? ?Assessment: Review of patient past medical history, allergies, medications, health status, including review of consultants reports, laboratory and other test data, was performed as part of comprehensive evaluation and provision of chronic care management services.  ? ?SDOH (Social Determinants of Health) assessments and interventions performed:   ? ?CCM Care Plan ? ?Allergies  ?Allergen Reactions  ? Codeine Nausea Only  ?  Tolerates oxycodone  ? Morphine Nausea And Vomiting  ? Ace Inhibitors Cough  ? Adhesive [Tape] Other (See Comments) and Rash  ?  whelps  ? ? ?Outpatient Encounter Medications as of 12/09/2021  ?Medication Sig  ? acetaminophen (TYLENOL) 500 MG tablet Take 500 mg by mouth every 6 (six) hours as needed for moderate pain.   ? amLODipine (NORVASC) 10 MG tablet Take 1 tablet (10 mg total) by mouth daily.  ? atorvastatin (LIPITOR) 10 MG tablet TAKE 1 TABLET(10 MG) BY MOUTH DAILY  ? cetirizine (ZYRTEC) 10 MG tablet TAKE 1 TABLET(10 MG) BY MOUTH DAILY  ? citalopram (CELEXA) 20 MG tablet TAKE 1 TABLET(20 MG) BY MOUTH DAILY  ? levothyroxine (SYNTHROID) 150 MCG tablet TAKE 1 TABLET(150 MCG) BY MOUTH DAILY  ? losartan (COZAAR) 100 MG  tablet TAKE 1 TABLET(100 MG) BY MOUTH DAILY  ? metoprolol succinate (TOPROL-XL) 50 MG 24 hr tablet Take with or immediately following a meal.  ? Multiple Vitamin (MULTIVITAMIN) capsule Take 1 capsule by mouth daily.  ? rOPINIRole (REQUIP) 0.5 MG tablet TAKE 1 TABLET(0.5 MG) BY MOUTH AT BEDTIME  ? tizanidine (ZANAFLEX) 2 MG capsule Take 1 capsule (2 mg total) by mouth at bedtime as needed for muscle spasms.  ? traZODone (DESYREL) 50 MG tablet TAKE 1 TABLET(50 MG) BY MOUTH AT BEDTIME AS NEEDED FOR SLEEP  ? ?No facility-administered encounter medications on file as of 12/09/2021.  ? ? ?Patient Active Problem List  ? Diagnosis Date Noted  ? Thrombocytopenia ([place]) 06/10/2021  ? Cognitive impairment 06/10/2021  ? Aortic atherosclerosis ([place]) 05/08/2021  ? Overweight with body mass index (BMI) of 29 to 29.9 in adult 03/18/2021  ? RLS (restless legs syndrome) 01/29/2021  ? 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 Hyperlipidemia 07/09/2020  ? Osteopenia of left femoral neck 06/09/2019  ? Hyperparathyroidism ([place]) 04/15/2019  ? Acquired hypothyroidism 08/05/2018  ? Anxiety 08/04/2018  ? 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 Osteoarthritis 04/14/2018  ? Hypertension 11/23/2017  ? ? ?Conditions to be addressed/monitored:HTN, HLD, Anxiety, Hypothyroidism, and Thrombocytopenia, anemia/bleeding ? ?Care Plan : RNCM: General Plan of Care (Adult)  ?Updates made by Vanita Ingles, RN since 12/09/2021 12:00 AM  ?  ? ?Problem: RNCM; Adult Plan of Care for Chronic Disease Management and Care Coordination Needs   ?Priority: High  ?Onset Date: 06/24/2021  ?  ? ?  Long-Range Goal: RNCM: Adult Plan of Care for Chronic Disease Management and Care Coordination Needs   ?Start Date: 06/24/2021  ?Expected End Date: 06/19/2022  ?Recent Progress: On track  ?Priority: High  ?Note:   ?Current Barriers:  ?Knowledge Deficits related to plan of care for management of HTN, HLD, Anxiety with Excessive Worry, ?Social Anxiety,  and thrombocytopenia and hypothyroidism  ?Chronic Disease Management support and education needs related to HTN, HLD, Anxiety with Excessive Worry, ?Social Anxiety, and thrombocytopenia and hypothyroidism  ?Cognitive Deficits ? ?RNCM Clinical Goal(s):  ?Patient will verbalize understanding of plan for management of HTN, HLD, Anxiety, and thrombocytopenia and hypothyroidism  ?verbalize basic understanding of HTN, HLD, Anxiety, and hypothyroidism and thrombocytopenia  disease process and self health management plan  ?take all medications exactly as prescribed and will call provider for medication related questions ?demonstrate understanding of rationale for each prescribed medication  ?attend all scheduled medical appointments 02-19-2022 at 0820 am ?demonstrate improved and ongoing adherence to prescribed treatment plan for HTN, HLD, Anxiety, and thrombocytopenia and hypothyroidism  as evidenced by adherence to prescribed medication regimen contacting provider for new or worsened symptoms or questions  ?demonstrate improved and ongoing health management independence by taking medications as prescribed, calling the office for changes in conditions, questions, or new concerns ?continue to work with RN Care Manager to address care management and care coordination needs related to HTN, HLD, Anxiety, and thrombocytopenia, and hypothyroidism   ?work with pharmacist to address Cognitive Deficits and medication reconciliation and ongoing support related to HTN, HLD, Anxiety, and thrombocytopenia, and hyporthyroidism ?demonstrate a decrease in HTN, HLD, Anxiety, and hypothyroidism and thrombocytopenia exacerbations  ?demonstrate ongoing self health care management ability  through collaboration with RN Care manager, provider, and care team.  ? ?Interventions: ?1:1 collaboration with primary care provider regarding development and update of comprehensive plan of care as evidenced by provider attestation and  co-signature ?Inter-disciplinary care team collaboration (see longitudinal plan of care) ?Evaluation of current treatment plan related to  self management and patient's adherence to plan as established by provider ? ? ?SDOH Barriers (Status: New goal. Goal on track: YES.)  ?Patient interviewed and SDOH assessment performed ?       ?SDOH Interventions   ? ?Flowsheet Row Most Recent Value  ?SDOH Interventions   ?Food Insecurity Interventions Intervention Not Indicated  ?Financial Strain Interventions Intervention Not Indicated  ?Intimate Partner Violence Interventions Intervention Not Indicated  ?Physical Activity Interventions Other (Comments)  [no structured activity]  ?Stress Interventions Intervention Not Indicated  ?Social Connections Interventions Other (Comment)  [good support system]  ?Transportation Interventions Intervention Not Indicated  ? ?  ?Patient interviewed and appropriate assessments performed ?Provided patient with information about CCM team and working with the patient to optimize health and well being ?Discussed plans with patient for ongoing care management follow up and provided patient with direct contact information for care management team ?Advised patient to call the office for changes in conditions, questions, or concerns about health and well being ?Collaborated with RN Case Manager re: working with the pharm D to assist with medications reconciliation and ongoing education and support of medications.  ? ? ? ?Anemia/Bleeding( Thrombocytopenia)  (Status: Goal on Track (progressing): YES.) ?Assessment of understanding of anemia/bleeding disorder diagnosis. 10-07-2021: Long standing anemia with thrombocytopenia. Face to face visit with the patient today with extensive education on risk of bleeding and protecting herself. The patient denies nose bleeds, blood in urine or stool. Does have sporadic bruising on bilateral arms. Education provided  that this is a normal finding with patients with  thrombocytopenia. Husband with the patient and reviewed risk and precautions with the husband as well. The patient ask good questions and just wanted reassurance. 12-09-2021: The patient states she stays cold "all the time". Edu

## 2021-12-16 ENCOUNTER — Other Ambulatory Visit: Payer: Self-pay

## 2021-12-16 ENCOUNTER — Ambulatory Visit (INDEPENDENT_AMBULATORY_CARE_PROVIDER_SITE_OTHER): Payer: PPO

## 2021-12-16 VITALS — BP 132/59 | HR 57 | Temp 98.3°F | Resp 18 | Ht 61.5 in | Wt 168.2 lb

## 2021-12-16 DIAGNOSIS — Z0000 Encounter for general adult medical examination without abnormal findings: Secondary | ICD-10-CM

## 2021-12-16 MED ORDER — LEVOTHYROXINE SODIUM 150 MCG PO TABS: ORAL_TABLET | ORAL | 1 refills | Status: DC

## 2021-12-16 MED ORDER — ATORVASTATIN CALCIUM 10 MG PO TABS: ORAL_TABLET | ORAL | 1 refills | Status: DC

## 2021-12-16 NOTE — Progress Notes (Signed)
? ?Subjective:  ? [name] is a 81 y.o. female who presents for Medicare Annual (Subsequent) preventive examination. ? ?Review of Systems  ?No other specific complaints in a complete review of systems (except as listed in HPI above). ?    ? ? ?  ?Objective:  ?  ?Today's Vitals  ? 12/16/21 1354 12/16/21 1355  ?BP:  (!) 152/67  ?Pulse:  (!) 58  ?Resp:  18  ?Temp:  98.3 ?F (36.8 ?C)  ?TempSrc:  Oral  ?SpO2:  99%  ?Weight:  168 lb 3.2 oz (76.3 kg)  ?Height:  5' 1.5" (1.562 m)  ?PainSc: 0-No pain 0-No pain  ? ?Body mass index is 31.27 kg/m?. ? ? ?  08/12/2021  ?  1:53 PM 04/23/2021  ?  7:46 AM 06/19/2020  ?  1:22 PM 06/08/2020  ?  8:03 AM 04/27/2020  ?  7:36 AM 04/20/2020  ? 11:25 AM 06/23/2019  ?  9:46 AM  ?Advanced Directives  ?Does Patient Have a Medical Advance Directive? Yes Yes Yes Yes Yes Yes Yes  ?Type of Paramedic of [place];Living will  [place];Living will [place];Living will Healthcare Power of [place];Living will Healthcare Power of Attorney  ?Does patient want to make changes to medical advance directive?    No - Patient declined No - Patient declined    ?Copy of [place] in Chart?   No - copy requested No - copy requested No - copy requested  No - copy requested  ? ? ?Current Medications (verified) ?Outpatient Encounter Medications as of 12/16/2021  ?Medication Sig  ? acetaminophen (TYLENOL) 500 MG tablet Take 500 mg by mouth every 6 (six) hours as needed for moderate pain.   ? amLODipine (NORVASC) 10 MG tablet Take 1 tablet (10 mg total) by mouth daily.  ? atorvastatin (LIPITOR) 10 MG tablet TAKE 1 TABLET(10 MG) BY MOUTH DAILY  ? cetirizine (ZYRTEC) 10 MG tablet TAKE 1 TABLET(10 MG) BY MOUTH DAILY  ? citalopram (CELEXA) 20 MG tablet TAKE 1 TABLET(20 MG) BY MOUTH DAILY  ? levothyroxine (SYNTHROID) 150 MCG tablet TAKE 1 TABLET(150 MCG) BY MOUTH DAILY  ? losartan (COZAAR) 100 MG tablet TAKE 1  TABLET(100 MG) BY MOUTH DAILY  ? metoprolol succinate (TOPROL-XL) 50 MG 24 hr tablet Take with or immediately following a meal.  ? Multiple Vitamin (MULTIVITAMIN) capsule Take 1 capsule by mouth daily.  ? rOPINIRole (REQUIP) 0.5 MG tablet TAKE 1 TABLET(0.5 MG) BY MOUTH AT BEDTIME  ? tizanidine (ZANAFLEX) 2 MG capsule Take 1 capsule (2 mg total) by mouth at bedtime as needed for muscle spasms.  ? traZODone (DESYREL) 50 MG tablet TAKE 1 TABLET(50 MG) BY MOUTH AT BEDTIME AS NEEDED FOR SLEEP  ? ?No facility-administered encounter medications on file as of 12/16/2021.  ? ? ?Allergies (verified) ?Codeine, Morphine, Ace inhibitors, and Adhesive [tape]  ? ?History: ?Past Medical History:  ?Diagnosis Date  ? Allergy   ? Anemia   ? Anxiety   ? Cancer [facility])   ? skin cancer  ? Hyperlipidemia   ? Hypertension   ? Hypothyroidism   ? Sciatica   ? Sleep apnea   ? does not use cpap  ? Thrombocytopenia ([place])   ? Thyroid disease   ? ?Past Surgical History:  ?Procedure Laterality Date  ? ABDOMINAL HYSTERECTOMY    ? APPENDECTOMY    ? BACK SURGERY    ? lower  ? BILATERAL  CARPAL TUNNEL RELEASE    ? BREAST CYST ASPIRATION Left   ? neg  ? COLONOSCOPY    ? COLONOSCOPY WITH PROPOFOL N/A 04/23/2021  ? Procedure: COLONOSCOPY WITH PROPOFOL;  Surgeon: Virgel Manifold, MD;  Location: [facility] END[facility];  Service: Endoscopy;  Laterality: N/A;  ? ENTROPIAN REPAIR Left 06/08/2020  ? Procedure: ENTROPION REPAIR, SUTURES ENTROPION REPAIR, EXTENSIVE LEFT;  Surgeon: Karle Starch, MD;  Location: [facility];  Service: Ophthalmology;  Laterality: Left;  ? JOINT REPLACEMENT    ? KNEE SURGERY Bilateral   ? KNEE SURGERY    ? OPEN REDUCTION INTERNAL FIXATION (ORIF) DISTAL RADIAL FRACTURE Left 06/23/2019  ? Procedure: OPEN REDUCTION INTERNAL FIXATION (ORIF) DISTAL RADIAL FRACTURE;  Surgeon: Hessie Knows, MD;  Location: [facility] OR[facility];  Service: Orthopedics;  Laterality: Left;  ? PARATHYROIDECTOMY  04/27/2020  ? SPINAL CORD STIMULATOR IMPLANT Right   ?  THYROIDECTOMY Right 04/27/2020  ? Procedure: PARATHYROIDECTOMY;  Surgeon: Johnathan Hausen, MD;  Location: [facility];  Service: General;  Laterality: Right;  ? TOTAL KNEE REVISION Right 08/13/2015  ? Procedure: TOTAL KNEE REVISION;  Surgeon: Paralee Cancel, MD;  Location: [facility];  Service: Orthopedics;  Laterality: Right;  ? ?Family History  ?Problem Relation Age of Onset  ? Dementia Mother   ? Dementia Sister   ? Heart attack Brother   ? Diabetes Brother   ? Breast cancer Paternal Aunt   ? Heart attack Son   ? Hypercalcemia Neg Hx   ? Cancer Neg Hx   ? ?Social History  ? ?Socioeconomic History  ? Marital status: Married  ?  Spouse name: Not on file  ? Number of children: 4  ? Years of education: Not on file  ? Highest education level: Not on file  ?Occupational History  ? Occupation: retired  ? Occupation: Retired  ?Tobacco Use  ? Smoking status: Every Day  ?  Packs/day: 0.50  ?  Years: 40.00  ?  Pack years: 20.00  ?  Types: Cigarettes  ? Smokeless tobacco: Never  ?Vaping Use  ? Vaping Use: Never used  ?Substance and Sexual Activity  ? Alcohol use: No  ? Drug use: No  ? Sexual activity: Not Currently  ?Other Topics Concern  ? Not on file  ?Social History Narrative  ? Not on file  ? ?Social Determinants of Health  ? ?Financial Resource Strain: Low Risk   ? Difficulty of Paying Living Expenses: Not hard at all  ?Food Insecurity: No Food Insecurity  ? Worried About Charity fundraiser in the Last Year: Never true  ? Ran Out of Food in the Last Year: Never true  ?Transportation Needs: No Transportation Needs  ? Lack of Transportation (Medical): No  ? Lack of Transportation (Non-Medical): No  ?Physical Activity: Sufficiently Active  ? Days of Exercise per Week: 5 days  ? Minutes of Exercise per Session: 30 min  ?Stress: No Stress Concern Present  ? Feeling of Stress : Not at all  ?Social Connections: Moderately Integrated  ? Frequency of Communication with Friends and Family: Once a week  ? Frequency of Social Gatherings with  Friends and Family: Once a week  ? Attends Religious Services: More than 4 times per year  ? Active Member of Clubs or Organizations: Yes  ? Attends Archivist Meetings: More than 4 times per year  ? Marital Status: Married  ? ? ?Tobacco Counseling ?Ready to quit: Not Answered ?Counseling given: Not Answered ? ? ?Clinical Intake: ? ?  Pre-visit preparation completed: No ? ?Pain : No/denies pain ?Pain Score: 0-No pain ? ?  ? ?Nutritional Status: BMI 25 -29 Overweight ?Diabetes: No ? ?How often do you need to have someone help you when you read instructions, pamphlets, or other written materials from your doctor or pharmacy?: 3 - Sometimes ? ?Diabetic? No ? ?Interpreter Needed?: No ? ?Information entered by :: Donnie Mesa, CMA ? ? ?Activities of Daily Living ? ?  12/16/2021  ?  1:50 PM 11/26/2021  ?  2:35 PM  ?In your present state of health, do you have any difficulty performing the following activities:  ?Hearing? 0 0  ?Vision? 1 0  ?Difficulty concentrating or making decisions? 0 0  ?Walking or climbing stairs? 1 1  ?Dressing or bathing? 0 0  ?Doing errands, shopping? 0 0  ? ? ?Patient Care Team: ?[name], NP as PCP - General (Internal Medicine) ?Vanita Ingles, RN as Registered Nurse (General Practice) ?Delles, Virl Diamond, RPH-CPP as Pharmacist ? ?Indicate any recent Medical Services you may have received from other than [facility] providers in the past year (date may be approximate). ? ?   ?Assessment:  ? This is a routine wellness examination for [name]. ? ?Hearing/Vision screen ?No results found. ? ?Dietary issues and exercise activities discussed: ?  ? ? Goals Addressed   ? ?  ?  ?  ?  ? This Visit's Progress  ?  Have 3 meals a day     ? ?  ? ?Depression Screen ? ?  12/16/2021  ?  1:50 PM 11/26/2021  ?  2:34 PM 10/23/2021  ?  9:35 AM 08/20/2021  ?  8:06 AM 05/08/2021  ?  9:32 AM 03/18/2021  ?  3:24 PM 12/04/2020  ?  2:23 PM  ?PHQ 2/9 Scores  ?PHQ - 2 Score '1 3 1 2 2 2 1  '$ ?PHQ- 9 Score '4 6 4 4 6 4 7  '$ ?   ?Fall Risk ? ?  12/16/2021  ?  1:49 PM 11/26/2021  ?  2:33 PM 10/23/2021  ?  9:35 AM 03/18/2021  ?  3:24 PM 06/19/2020  ?  1:23 PM  ?Fall Risk   ?Falls in the past year? 0 0 0 0 1  ?Comment     feet got tangled  ?Number

## 2021-12-16 NOTE — Patient Instructions (Signed)
Fall Prevention in the Home, Adult ?Falls can cause injuries and can happen to people of all ages. There are many things you can do to make your home safe and to help prevent falls. Ask for help when making these changes. ?What actions can I take to prevent falls? ?General Instructions ?Use good lighting in all rooms. Replace any light bulbs that burn out. ?Turn on the lights in dark areas. Use night-lights. ?Keep items that you use often in easy-to-reach places. Lower the shelves around your home if needed. ?Set up your furniture so you have a clear path. Avoid moving your furniture around. ?Do not have throw rugs or other things on the floor that can make you trip. ?Avoid walking on wet floors. ?If any of your floors are uneven, fix them. ?Add color or contrast paint or tape to clearly mark and help you see: ?Grab bars or handrails. ?First and last steps of staircases. ?Where the edge of each step is. ?If you use a stepladder: ?Make sure that it is fully opened. Do not climb a closed stepladder. ?Make sure the sides of the stepladder are locked in place. ?Ask someone to hold the stepladder while you use it. ?Know where your pets are when moving through your home. ?What can I do in the bathroom? ?  ?Keep the floor dry. Clean up any water on the floor right away. ?Remove soap buildup in the tub or shower. ?Use nonskid mats or decals on the floor of the tub or shower. ?Attach bath mats securely with double-sided, nonslip rug tape. ?If you need to sit down in the shower, use a plastic, nonslip stool. ?Install grab bars by the toilet and in the tub and shower. Do not use towel bars as grab bars. ?What can I do in the bedroom? ?Make sure that you have a light by your bed that is easy to reach. ?Do not use any sheets or blankets for your bed that hang to the floor. ?Have a firm chair with side arms that you can use for support when you get dressed. ?What can I do in the kitchen? ?Clean up any spills right away. ?If you  need to reach something above you, use a step stool with a grab bar. ?Keep electrical cords out of the way. ?Do not use floor polish or wax that makes floors slippery. ?What can I do with my stairs? ?Do not leave any items on the stairs. ?Make sure that you have a light switch at the top and the bottom of the stairs. ?Make sure that there are handrails on both sides of the stairs. Fix handrails that are broken or loose. ?Install nonslip stair treads on all your stairs. ?Avoid having throw rugs at the top or bottom of the stairs. ?Choose a carpet that does not hide the edge of the steps on the stairs. ?Check carpeting to make sure that it is firmly attached to the stairs. Fix carpet that is loose or worn. ?What can I do on the outside of my home? ?Use bright outdoor lighting. ?Fix the edges of walkways and driveways and fix any cracks. ?Remove anything that might make you trip as you walk through a door, such as a raised step or threshold. ?Trim any bushes or trees on paths to your home. ?Check to see if handrails are loose or broken and that both sides of all steps have handrails. ?Install guardrails along the edges of any raised decks and porches. ?Clear paths of anything that can  make you trip, such as tools or rocks. ?Have leaves, snow, or ice cleared regularly. ?Use sand or salt on paths during winter. ?Clean up any spills in your garage right away. This includes grease or oil spills. ?What other actions can I take? ?Wear shoes that: ?Have a low heel. Do not wear high heels. ?Have rubber bottoms. ?Feel good on your feet and fit well. ?Are closed at the toe. Do not wear open-toe sandals. ?Use tools that help you move around if needed. These include: ?Canes. ?Walkers. ?Scooters. ?Crutches. ?Review your medicines with your doctor. Some medicines can make you feel dizzy. This can increase your chance of falling. ?Ask your doctor what else you can do to help prevent falls. ?Where to find more information ?Centers for  Disease Control and Prevention, [employer]AD[employer]: http://www.wolf.info/ ?[employer] on Aging: http://kim-miller.com/ ?Contact a doctor if: ?You are afraid of falling at home. ?You feel weak, drowsy, or dizzy at home. ?You fall at home. ?Summary ?There are many simple things that you can do to make your home safe and to help prevent falls. ?Ways to make your home safe include removing things that can make you trip and installing grab bars in the bathroom. ?Ask for help when making these changes in your home. ?This information is not intended to replace advice given to you by your health care provider. Make sure you discuss any questions you have with your health care provider. ?Document Revised: 04/11/2020 Document Reviewed: 04/11/2020 ?Elsevier Patient Education ? [place]. ? ?Health Maintenance, Female ?Adopting a healthy lifestyle and getting preventive care are important in promoting health and wellness. Ask your health care provider about: ?The right schedule for you to have regular tests and exams. ?Things you can do on your own to prevent diseases and keep yourself healthy. ?What should I know about diet, weight, and exercise? ?Eat a healthy diet ? ?Eat a diet that includes plenty of vegetables, fruits, low-fat dairy products, and lean protein. ?Do not eat a lot of foods that are high in solid fats, added sugars, or sodium. ?Maintain a healthy weight ?Body mass index (BMI) is used to identify weight problems. It estimates body fat based on height and weight. Your health care provider can help determine your BMI and help you achieve or maintain a healthy weight. ?Get regular exercise ?Get regular exercise. This is one of the most important things you can do for your health. Most adults should: ?Exercise for at least 150 minutes each week. The exercise should increase your heart rate and make you sweat (moderate-intensity exercise). ?Do strengthening exercises at least twice a week. This is in addition to the  moderate-intensity exercise. ?Spend less time sitting. Even light physical activity can be beneficial. ?Watch cholesterol and blood lipids ?Have your blood tested for lipids and cholesterol at 80 years of age, then have this test every 5 years. ?Have your cholesterol levels checked more often if: ?Your lipid or cholesterol levels are high. ?You are older than 80 years of age. ?You are at high risk for heart disease. ?What should I know about cancer screening? ?Depending on your health history and family history, you may need to have cancer screening at various ages. This may include screening for: ?Breast cancer. ?Cervical cancer. ?Colorectal cancer. ?Skin cancer. ?Lung cancer. ?What should I know about heart disease, diabetes, and high blood pressure? ?Blood pressure and heart disease ?High blood pressure causes heart disease and increases the risk of stroke. This is more likely to develop in  people who have high blood pressure readings or are overweight. ?Have your blood pressure checked: ?Every 3-5 years if you are 34-50 years of age. ?Every year if you are 70 years old or older. ?Diabetes ?Have regular diabetes screenings. This checks your fasting blood sugar level. Have the screening done: ?Once every three years after age 54 if you are at a normal weight and have a low risk for diabetes. ?More often and at a younger age if you are overweight or have a high risk for diabetes. ?What should I know about preventing infection? ?Hepatitis B ?If you have a higher risk for hepatitis B, you should be screened for this virus. Talk with your health care provider to find out if you are at risk for hepatitis B infection. ?Hepatitis C ?Testing is recommended for: ?Everyone born from [birth year] through 1964. ?Anyone with known risk factors for hepatitis C. ?Sexually transmitted infections (STIs) ?Get screened for STIs, including gonorrhea and chlamydia, if: ?You are sexually active and are younger than 80 years of age. ?You are  older than 80 years of age and your health care provider tells you that you are at risk for this type of infection. ?Your sexual activity has changed since you were last screened, and you are at increased risk for

## 2021-12-20 DIAGNOSIS — E039 Hypothyroidism, unspecified: Secondary | ICD-10-CM

## 2021-12-20 DIAGNOSIS — D696 Thrombocytopenia, unspecified: Secondary | ICD-10-CM | POA: Diagnosis not present

## 2021-12-20 DIAGNOSIS — E785 Hyperlipidemia, unspecified: Secondary | ICD-10-CM | POA: Diagnosis not present

## 2021-12-20 DIAGNOSIS — I10 Essential (primary) hypertension: Secondary | ICD-10-CM | POA: Diagnosis not present

## 2021-12-23 ENCOUNTER — Telehealth: Payer: Self-pay | Admitting: Pharmacist

## 2021-12-23 ENCOUNTER — Telehealth: Payer: PPO

## 2021-12-23 NOTE — Telephone Encounter (Signed)
?  Chronic Care Management  ? ?Outreach Note ? ?12/23/2021 ?Name: [name] MRN: [id number] DOB: 03-22-1941 ? ?Referred by: [name], NP ?Reason for referral : No chief complaint on file. ? ? ?Reach patient by telephone today, but patient reports now is not a good time for our conversation. Requests a call back another day to reschedule ? ? ?Follow Up Plan: Will collaborate with Care Guide to outreach to schedule follow up with me ? ?Wallace Cullens, PharmD, BCACP ?Clinical Pharmacist ?[facility] ?[phone] ? ?

## 2021-12-24 ENCOUNTER — Ambulatory Visit
Admission: RE | Admit: 2021-12-24 | Discharge: 2021-12-24 | Disposition: A | Discharge status: Home / Self Care | Payer: PPO | Source: Ambulatory Visit | Attending: Internal Medicine | Admitting: Internal Medicine

## 2021-12-24 DIAGNOSIS — Z1231 Encounter for screening mammogram for malignant neoplasm of breast: Secondary | ICD-10-CM | POA: Diagnosis not present

## 2021-12-24 DIAGNOSIS — M85852 Other specified disorders of bone density and structure, left thigh: Secondary | ICD-10-CM | POA: Diagnosis not present

## 2021-12-24 DIAGNOSIS — Z780 Asymptomatic menopausal state: Secondary | ICD-10-CM | POA: Diagnosis not present

## 2022-01-09 ENCOUNTER — Other Ambulatory Visit: Payer: Self-pay | Admitting: Internal Medicine

## 2022-01-09 DIAGNOSIS — F5104 Psychophysiologic insomnia: Secondary | ICD-10-CM

## 2022-01-10 NOTE — Telephone Encounter (Signed)
Requested Prescriptions  ?Pending Prescriptions Disp Refills  ?? traZODone (DESYREL) 50 MG tablet [Pharmacy Med Name: TRAZODONE '50MG'$  TABLETS] 90 tablet 0  ?  Sig: TAKE 1 TABLET(50 MG) BY MOUTH AT BEDTIME AS NEEDED FOR SLEEP  ?  ? Psychiatry: Antidepressants - Serotonin Modulator Passed - 01/09/2022 11:49 AM  ?  ?  Passed - Valid encounter within last 6 months  ?  Recent Outpatient Visits   ?      ? 1 month ago Primary hypertension  ? [facility] [place], [place] W, NP  ? 1 month ago Mass of right side of neck  ? [facility] [place], [place] W, NP  ? 2 months ago Sore throat  ? [facility] [place], [place] W, NP  ? 3 months ago Acute right-sided low back pain without sciatica  ? [facility] [place], [place] W, NP  ? 4 months ago Frequent headaches  ? [facility] [place], Coralie Keens, NP  ?  ?  ?Future Appointments   ?        ? In 1 month Baity, Coralie Keens, NP [facility], [place]  ?  ? ?  ?  ?  ? ?

## 2022-01-13 ENCOUNTER — Ambulatory Visit (INDEPENDENT_AMBULATORY_CARE_PROVIDER_SITE_OTHER): Payer: PPO | Admitting: Pharmacist

## 2022-01-13 DIAGNOSIS — I10 Essential (primary) hypertension: Secondary | ICD-10-CM

## 2022-01-13 NOTE — Chronic Care Management (AMB) (Addendum)
? ?Chronic Care Management ?CCM Pharmacy Note ? ?01/13/2022 ?Name:  [name] MRN:  [id number] DOB:  10-21-1940 ? ? ?Subjective: ?[name] is an 81 y.o. year old female who is a primary patient of [name], NP.  The CCM team was consulted for assistance with disease management and care coordination needs.   ? ?Engaged with patient by telephone for follow up visit for pharmacy case management and/or care coordination services.  ? ?Objective: ? ?Medications Reviewed Today   ? ? Reviewed by Wilson Singer, CMA (Certified Medical Assistant) on 12/16/21 at 1348  Med List Status: <None>  ? ?Medication Order Taking? Sig Documenting Provider Last Dose Status Informant  ?acetaminophen (TYLENOL) 500 MG tablet [id number] Yes Take 500 mg by mouth every 6 (six) hours as needed for moderate pain.  [provider] Taking Active Self  ?amLODipine (NORVASC) 10 MG tablet [id number] Yes Take 1 tablet (10 mg total) by mouth daily. [name], NP Taking Active   ?atorvastatin (LIPITOR) 10 MG tablet [id number] Yes TAKE 1 TABLET(10 MG) BY MOUTH DAILY Baity, Coralie Keens, NP Taking Active   ?cetirizine (ZYRTEC) 10 MG tablet [id number] Yes TAKE 1 TABLET(10 MG) BY MOUTH DAILY [name], NP Taking Active   ?citalopram (CELEXA) 20 MG tablet [id number] Yes TAKE 1 TABLET(20 MG) BY MOUTH DAILY [name], NP Taking Active   ?levothyroxine (SYNTHROID) 150 MCG tablet [id number] Yes TAKE 1 TABLET(150 MCG) BY MOUTH DAILY [name], NP Taking Active   ?losartan (COZAAR) 100 MG tablet [id number] Yes TAKE 1 TABLET(100 MG) BY MOUTH DAILY [name], NP Taking Active   ?metoprolol succinate (TOPROL-XL) 50 MG 24 hr tablet [id number] Yes Take with or immediately following a meal. [name], NP Taking Active   ?Multiple Vitamin (MULTIVITAMIN) capsule [id number] Yes Take 1 capsule by mouth daily. [provider] Taking Active   ?rOPINIRole (REQUIP) 0.5 MG tablet [id number] Yes TAKE 1 TABLET(0.5 MG)  BY MOUTH AT BEDTIME [name], NP Taking Active   ?tizanidine (ZANAFLEX) 2 MG capsule [id number] Yes Take 1 capsule (2 mg total) by mouth at bedtime as needed for muscle spasms. [name], NP Taking Active   ?traZODone (DESYREL) 50 MG tablet [id number] Yes TAKE 1 TABLET(50 MG) BY MOUTH AT BEDTIME AS NEEDED FOR SLEEP Baity, Coralie Keens, NP Taking Active   ? ?  ?  ? ?  ? ? ?Pertinent Labs:  ? ? ?Lab Results  ?Component Value Date  ? CREATININE 0.86 07/29/2021  ? BUN 18 07/29/2021  ? NA 144 07/29/2021  ? K 4.0 07/29/2021  ? CL 108 07/29/2021  ? CO2 27 07/29/2021  ? ?BP Readings from Last 3 Encounters:  ?12/16/21 (!) 132/59  ?12/03/21 124/68  ?11/26/21 (!) 153/64  ? ?Pulse Readings from Last 3 Encounters:  ?12/16/21 (!) 57  ?12/03/21 (!) 58  ?11/26/21 63  ? ? ? ?SDOH:  (Social Determinants of Health) assessments and interventions performed:  ? ? ?CCM Care Plan ? ?Review of patient past medical history, allergies, medications, health status, including review of consultants reports, laboratory and other test data, was performed as part of comprehensive evaluation and provision of chronic care management services.  ? ?Care Plan : PharmD - Medication Management; HTN  ?Updates made by Rennis Petty, RPH-CPP since 01/13/2022 12:00 AM  ?  ? ?Problem: Disease Progression   ?  ? ?Long-Range Goal: Disease Progression Prevented or Minimized   ?Start Date: 07/01/2021  ?Expected  End Date: 09/29/2021  ?Recent Progress: On track  ?Priority: High  ?Note:   ?Current Barriers:  ?Unable to achieve control of hypertension  ? ?Pharmacist Clinical Goal(s):  ?patient will achieve adherence to monitoring guidelines and medication adherence to achieve therapeutic efficacy ?achieve control of hypertension as evidenced by blood pressure readings  through collaboration with PharmD and provider.  ? ? ?Interventions: ?1:1 collaboration with [name], NP regarding development and update of comprehensive plan of care as evidenced by  provider attestation and co-signature ?Inter-disciplinary care team collaboration (see longitudinal plan of care) ? ?Hypertension:   ?Current treatment: ?Amlodipine 10 mg daily ?Metoprolol ER 50 mg QAM ?Losartan 100 mg daily ?Identify patient in need of refill of amlodipine 10 mg Rx.  ?States will call pharmacy today ?Again encourage patient to obtain new upper arm blood pressure monitor. Remind patient of importance of having correct cuff size for accuracy of readings ?Denies symptoms of hypotension or hypertension ?Have counseled patient on home BP monitoring technique ?Counsel patient once obtain new monitor, to start monitoring home BP, to keep a log of the results, have this record for [place] to review during telephone appointment, but to call office sooner or readings outside of established parameter or for new/worsening symptoms ? ?Medication Adherence:  ?Patient uses weekly pillbox to organize her medications ?Reports rarely misses a dose ?Patient has denied interest in using pill packaging ?Encourage patient to continue to use weekly pillbox. Have advised patient to also use medication list from latest office visit with PCP when fills pillbox to confirm medications and dosages ? ?Tobacco Use: ?Have counseled patient on benefits of smoking cessation ?Reports has reduced smoking to ~10 cigarettes/day ?States not interested in quitting at this time ? ?Patient Goals/Self-Care Activities ?patient will:  ?- take medications as prescribed ?- check blood pressure, document, and provide at future appointments ? ?Follow Up Plan: The care management team will reach out to the patient again in ~3 months ? ?  ?  ? ?Wallace Cullens, PharmD, BCACP, CPP ?Clinical Pharmacist ?[facility] ?[place] ?[phone] ? ? ? ? ? ?

## 2022-01-13 NOTE — Patient Instructions (Signed)
Visit Information ? ?Thank you for taking time to visit with me today. Please don't hesitate to contact me if I can be of assistance to you before our next scheduled telephone appointment. ? ?Following are the goals we discussed today:  ? Goals Addressed   ? ?  ?  ?  ?  ? This Visit's Progress  ?  Pharmacy Goals     ?  Please obtain new upper arm blood pressure monitor and restart checking your home blood pressure ? ?We recommend a blood pressure cuff that goes around your upper arm, as these are generally the most accurate.  ? ?To appropriately check your blood pressure, make sure you do the following:  ?1) Avoid caffeine, exercise, or tobacco products for 30 minutes before checking. ?2) Sit with your back supported in a flat-backed chair. Rest your arm on something flat (arm of the chair, table, etc). ?3) Sit still with your feet flat on the floor, resting, for at least 5 minutes.  ?4) Check your blood pressure. Take 1-2 readings.  ? ?Write down these readings and bring with you to any provider appointments. Bring your home blood pressure machine with you to a provider's office for accuracy comparison at least once a year. Make sure you take your blood pressure medications before you come to any office visit. ? ? ?Feel free to call me with any questions or concerns. I look forward to our next call! ? ? ?Wallace Cullens, PharmD, BCACP ?Clinical Pharmacist ?[facility] ?[place] ?[phone] ?  ? ?  ? ? ? ?Please call the care guide team at [phone] if you need to cancel or reschedule your appointment.  ? ? ?Patient verbalizes understanding of instructions and care plan provided today and agrees to view in [place]. Active MyChart status confirmed with patient.   ? ?

## 2022-01-19 DIAGNOSIS — F17210 Nicotine dependence, cigarettes, uncomplicated: Secondary | ICD-10-CM

## 2022-01-19 DIAGNOSIS — I10 Essential (primary) hypertension: Secondary | ICD-10-CM

## 2022-02-03 ENCOUNTER — Telehealth: Payer: PPO

## 2022-02-03 ENCOUNTER — Ambulatory Visit (INDEPENDENT_AMBULATORY_CARE_PROVIDER_SITE_OTHER): Payer: PPO

## 2022-02-03 DIAGNOSIS — D696 Thrombocytopenia, unspecified: Secondary | ICD-10-CM

## 2022-02-03 DIAGNOSIS — E7800 Pure hypercholesterolemia, unspecified: Secondary | ICD-10-CM

## 2022-02-03 DIAGNOSIS — D649 Anemia, unspecified: Secondary | ICD-10-CM

## 2022-02-03 DIAGNOSIS — F419 Anxiety disorder, unspecified: Secondary | ICD-10-CM

## 2022-02-03 DIAGNOSIS — I10 Essential (primary) hypertension: Secondary | ICD-10-CM

## 2022-02-03 DIAGNOSIS — E039 Hypothyroidism, unspecified: Secondary | ICD-10-CM

## 2022-02-03 NOTE — Patient Instructions (Signed)
Visit Information ? ?Thank you for taking time to visit with me today. Please don't hesitate to contact me if I can be of assistance to you before our next scheduled telephone appointment. ? ?Following are the goals we discussed today:  ?Anemia/Bleeding( Thrombocytopenia)  (Status: Goal on Track (progressing): YES.) ?Assessment of understanding of anemia/bleeding disorder diagnosis. 10-07-2021: Long standing anemia with thrombocytopenia. Face to face visit with the patient today with extensive education on risk of bleeding and protecting herself. The patient denies nose bleeds, blood in urine or stool. Does have sporadic bruising on bilateral arms. Education provided that this is a normal finding with patients with thrombocytopenia. Husband with the patient and reviewed risk and precautions with the husband as well. The patient ask good questions and just wanted reassurance. 12-09-2021: The patient states she stays cold "all the time". Education provided and reassurance given. The patient states she is supposed to see oncology next month for further evaluation and treatment options. She states that she just does not like the way she feels sometimes. She is optimistic and was in her sunroom doing her puzzles at the time of the call. She denies any acute findings at this time. Will continue to monitor. 02-03-2022: The patient states that she has noticed more "red spots" on her arms and legs and she is sure it is from her thrombocytopenia. The patient states that she is being careful. Review of safety measures and healthy eating. The patient states she sees the specialist this week for follow up and recommendations.  ?Basic overview and discussion of anemia/bleeding disorder or acute disease state. 02-03-2022: Review with educational information provided. The patient states that she has more and more red spots. Review of not using a razor to shave her arms or legs, monitoring for safety and fall risk, eating healthy and  monitoring her condition on a routine basis.  ?Review of most recent labs:  ?     ?Lab Results  ?Component Value Date  ?  WBC 6.6 07/29/2021  ?  HGB 15.8 (H) 07/29/2021  ?  HCT 46.9 (H) 07/29/2021  ?  MCV 96.7 07/29/2021  ?  PLT 110 (L) 07/29/2021  ?  ?Medications reviewed. 07-29-2021: Face to face visit with the patient review of medications. She is compliant with medications. Ask for assistance with getting a refill for her amlodipine 5 mg that does not have refills. Will collaborate with the pcp and pharm D. 10-07-2021: Review with the patient and submitted refill request for expressed needs. Collaboration between the pcp and CCM team. 02-03-2022: The patient endorses compliance with medications.  Continue to work with the pharm D for effective management of her medications.  ?- Counseled on bleeding risk associated with thrombocytopenia and importance of self-monitoring for signs/symptoms of bleeding. 07-29-2021: printed off material on thrombocytopenia and reviewed with the patient. The patient is having a hard time understanding what it means to have low platelets. Extensive education. The patient is having blood work today to be evaluated before her upcoming physical. 02-03-2022: Review and recommendations given. Will continue to monitor for gaps in knowledge on chronic disease processes and changes.  ?- Counseled on avoidance of NSAIDs due to increased bleeding risk, extensive education on avoiding NSAIDS. The patient ask if taking aspirin and ibuprofen were okay to take. The patient advised to avoid aspirin, ibuprofen, advil, aleve, naproxen and other medications classified as NSAIDS. Advised the patient if she had to take something for a headache to use Tylenol and not advil or ibuprofen.  Used teach back method to enforce the need to not use aspirin or NSAIDS. Will also consult the pharm D for assistance with education on avoiding ASA and NSAIDS. 10-07-2021: Reviewed with the patient not to take NSAIDS as this  puts her at higher risk of bleeding. The patient states she only takes Tylenol when she needs it. Extensive review with the patient and her husband on avoiding Aleve, Aspirin, ADVIL and other NSAID products. Questions answers and visual provided. 02-03-2022: Review and educational support provided. The patient does well with redirection and continued reminders of how to effectively manage her care.  ?- Counseled on importance of regular laboratory monitoring as directed by provider.  07-29-2021: Is having lab work today in the office . 02-03-2022: Has labwork on a regular basis; ?- Provided education about signs and symptoms of active bleeding such as stomach discomfort, coughing up blood or blood tinged secretions, bleeding from the gums/teeth, nosebleeds, increased bruising, blood in the urine/stool and/or if a traumatic injury occurs, regardless of severity of injury. 02-03-2022 Reviewed with the patient to call the office for changes in condition or noting any bleeding in urine, stool or more bruising than usual. The patient is aware of safety risk associated with thrombocytopenia ;  ?- Advised to call provider or 911 if active bleeding or signs and symptoms of active bleeding occur; ?- recommended promotion of rest and energy-conserving measures to manage fatigue, such as balancing activity with periods of rest ?- encouraged strategies to prevent falls related to fatigue, weakness and dizziness; encouraged sitting before standing and using an assistive device. 02-03-2022: Denies falls, is having some dizziness and light-headedness at times when changing position.  Review of changing position slowly to prevent orthostatic hypotension. Education and support given for safety and falls prevention ?- encouraged optimal oral intake to support fluid balance and nutrition ?- encouraged dietary changes to increase dietary intake of iron, Vitamin T55 and folic acid as advised/prescribed ?Education provided from pcp notes that  the pcp feels thrombocytopenia may be coming from medications she had been taken. Reviewed medication changes with the patient and she verbalized understanding. 10-07-2021: Extensive review with the patient and the patients husband at face to face visit today. 02-03-2022: Review of disease process of thrombocytopenia and to keep appointments with specialist and pcp and call the office for questions or concerns. The patient states that she sees her specialist this week for follow up and review.  ?  ?Anxiety   (Status: Goal on Track (progressing): YES.) ?Evaluation of current treatment plan related to Anxiety,  self-management and patient's adherence to plan as established by provider. 07-29-2021: The patient feels that she is stable with her anxiety. She denies any acute exacerbations. Recent friend of the family passed away and she reflected on the services. Empathetic listening and support given. 10-07-2021: No acute findings related to anxiety today. The patient is calm and able to recall events and denies any acute distress. Has good support system from her husband who accompanied her today. 12-09-2021: The patient was doing well today. She states she gets concerned about how she feels sometimes but she is not "giving up". The patient states that she was enjoying the sunroom and working on her puzzles. She denies any acute anxiety today. She feels like she is sleeping well and eating well. She is happy there were no acute findings related to the neck mass she had. She says it is a little sore where they were mashing on her neck. 02-03-2022: The patient  is doing well and denies any acute findings related to her anxiety. She is taking care of herself the best that she can and denies any new concerns. Is thankful for the support of the CCM team and states she is thankful for each day she is give. Will continue to monitor for changes or new needs.  ?Discussed plans with patient for ongoing care management follow up and  provided patient with direct contact information for care management team ?Advised patient to call the office for changes in mood, anxiety, or depression ; ?Provided education to patient re: pacing activit

## 2022-02-03 NOTE — Chronic Care Management (AMB) (Signed)
?Chronic Care Management  ? ?CCM RN Visit Note ? ?02/03/2022 ?Name: [name] MRN: [id number] DOB: 1941/02/11 ? ?Subjective: ?[name] is a 81 y.o. year old female who is a primary care patient of [name], NP. The care management team was consulted for assistance with disease management and care coordination needs.   ? ?Engaged with patient by telephone for follow up visit in response to provider referral for case management and/or care coordination services.  ? ?Consent to Services:  ?The patient was given information about Chronic Care Management services, agreed to services, and gave verbal consent prior to initiation of services.  Please see initial visit note for detailed documentation.  ? ?Patient agreed to services and verbal consent obtained.  ? ?Assessment: Review of patient past medical history, allergies, medications, health status, including review of consultants reports, laboratory and other test data, was performed as part of comprehensive evaluation and provision of chronic care management services.  ? ?SDOH (Social Determinants of Health) assessments and interventions performed:   ? ?CCM Care Plan ? ?Allergies  ?Allergen Reactions  ? Codeine Nausea Only  ?  Tolerates oxycodone  ? Morphine Nausea And Vomiting  ? Ace Inhibitors Cough  ? Adhesive [Tape] Other (See Comments) and Rash  ?  whelps  ? ? ?Outpatient Encounter Medications as of 02/03/2022  ?Medication Sig  ? acetaminophen (TYLENOL) 500 MG tablet Take 500 mg by mouth every 6 (six) hours as needed for moderate pain.   ? amLODipine (NORVASC) 10 MG tablet Take 1 tablet (10 mg total) by mouth daily.  ? atorvastatin (LIPITOR) 10 MG tablet TAKE 1 TABLET(10 MG) BY MOUTH DAILY  ? cetirizine (ZYRTEC) 10 MG tablet TAKE 1 TABLET(10 MG) BY MOUTH DAILY  ? citalopram (CELEXA) 20 MG tablet TAKE 1 TABLET(20 MG) BY MOUTH DAILY  ? levothyroxine (SYNTHROID) 150 MCG tablet TAKE 1 TABLET(150 MCG) BY MOUTH DAILY  ? losartan (COZAAR) 100 MG  tablet TAKE 1 TABLET(100 MG) BY MOUTH DAILY  ? metoprolol succinate (TOPROL-XL) 50 MG 24 hr tablet Take with or immediately following a meal.  ? Multiple Vitamin (MULTIVITAMIN) capsule Take 1 capsule by mouth daily.  ? rOPINIRole (REQUIP) 0.5 MG tablet TAKE 1 TABLET(0.5 MG) BY MOUTH AT BEDTIME  ? tizanidine (ZANAFLEX) 2 MG capsule Take 1 capsule (2 mg total) by mouth at bedtime as needed for muscle spasms.  ? traZODone (DESYREL) 50 MG tablet TAKE 1 TABLET(50 MG) BY MOUTH AT BEDTIME AS NEEDED FOR SLEEP  ? ?No facility-administered encounter medications on file as of 02/03/2022.  ? ? ?Patient Active Problem List  ? Diagnosis Date Noted  ? Thrombocytopenia ([place]) 06/10/2021  ? Cognitive impairment 06/10/2021  ? Aortic atherosclerosis ([place]) 05/08/2021  ? Overweight with body mass index (BMI) of 29 to 29.9 in adult 03/18/2021  ? RLS (restless legs syndrome) 01/29/2021  ? Obstructive chronic bronchitis without exacerbation ([place]) 01/29/2021  ? Hyperlipidemia 07/09/2020  ? Osteopenia of left femoral neck 06/09/2019  ? Hyperparathyroidism ([place]) 04/15/2019  ? Acquired hypothyroidism 08/05/2018  ? Anxiety 08/04/2018  ? 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 Osteoarthritis 04/14/2018  ? Hypertension 11/23/2017  ? ? ?Conditions to be addressed/monitored:HTN, HLD, Anxiety, and Anemia, bleeding, thrombocytopenia, hypothyroidism ? ?Care Plan : RNCM: General Plan of Care (Adult)  ?Updates made by Jocelyn Ingles, RN since 02/03/2022 12:00 AM  ?  ? ?Problem: RNCM; Adult Plan of Care for Chronic Disease Management and Care Coordination Needs   ?Priority: High  ?Onset Date: 06/24/2021  ?  ? ?  Long-Range Goal: RNCM: Adult Plan of Care for Chronic Disease Management and Care Coordination Needs   ?Start Date: 06/24/2021  ?Expected End Date: 06/19/2022  ?Recent Progress: On track  ?Priority: High  ?Note:   ?Current Barriers:  ?Knowledge Deficits related to plan of care for management of HTN, HLD, Anxiety with Excessive Worry, ?Social Anxiety,  and thrombocytopenia and hypothyroidism  ?Chronic Disease Management support and education needs related to HTN, HLD, Anxiety with Excessive Worry, ?Social Anxiety, and thrombocytopenia and hypothyroidism  ?Cognitive Deficits ? ?RNCM Clinical Goal(s):  ?Patient will verbalize understanding of plan for management of HTN, HLD, Anxiety, and thrombocytopenia and hypothyroidism  ?verbalize basic understanding of HTN, HLD, Anxiety, and hypothyroidism and thrombocytopenia  disease process and self health management plan  ?take all medications exactly as prescribed and will call provider for medication related questions ?demonstrate understanding of rationale for each prescribed medication  ?attend all scheduled medical appointments 02-19-2022 at 0820 am- reminder provided today (02-03-2022) ?demonstrate improved and ongoing adherence to prescribed treatment plan for HTN, HLD, Anxiety, and thrombocytopenia and hypothyroidism  as evidenced by adherence to prescribed medication regimen contacting provider for new or worsened symptoms or questions  ?demonstrate improved and ongoing health management independence by taking medications as prescribed, calling the office for changes in conditions, questions, or new concerns ?continue to work with RN Care Manager to address care management and care coordination needs related to HTN, HLD, Anxiety, and thrombocytopenia, and hypothyroidism   ?work with pharmacist to address Cognitive Deficits and medication reconciliation and ongoing support related to HTN, HLD, Anxiety, and thrombocytopenia, and hyporthyroidism ?demonstrate a decrease in HTN, HLD, Anxiety, and hypothyroidism and thrombocytopenia exacerbations  ?demonstrate ongoing self health care management ability  through collaboration with RN Care manager, provider, and care team.  ? ?Interventions: ?1:1 collaboration with primary care provider regarding development and update of comprehensive plan of care as evidenced by provider  attestation and co-signature ?Inter-disciplinary care team collaboration (see longitudinal plan of care) ?Evaluation of current treatment plan related to  self management and patient's adherence to plan as established by provider ? ? ?SDOH Barriers (Status: New goal. Goal on track: YES.)  ?Patient interviewed and SDOH assessment performed ?       ?SDOH Interventions   ? ?Flowsheet Row Most Recent Value  ?SDOH Interventions   ?Food Insecurity Interventions Intervention Not Indicated  ?Financial Strain Interventions Intervention Not Indicated  ?Intimate Partner Violence Interventions Intervention Not Indicated  ?Physical Activity Interventions Other (Comments)  [no structured activity]  ?Stress Interventions Intervention Not Indicated  ?Social Connections Interventions Other (Comment)  [good support system]  ?Transportation Interventions Intervention Not Indicated  ? ?  ?Patient interviewed and appropriate assessments performed ?Provided patient with information about CCM team and working with the patient to optimize health and well being ?Discussed plans with patient for ongoing care management follow up and provided patient with direct contact information for care management team ?Advised patient to call the office for changes in conditions, questions, or concerns about health and well being ?Collaborated with RN Case Manager re: working with the pharm D to assist with medications reconciliation and ongoing education and support of medications.  ? ? ? ?Anemia/Bleeding( Thrombocytopenia)  (Status: Goal on Track (progressing): YES.) ?Assessment of understanding of anemia/bleeding disorder diagnosis. 10-07-2021: Long standing anemia with thrombocytopenia. Face to face visit with the patient today with extensive education on risk of bleeding and protecting herself. The patient denies nose bleeds, blood in urine or stool. Does have sporadic bruising on  bilateral arms. Education provided that this is a normal finding with  patients with thrombocytopenia. Husband with the patient and reviewed risk and precautions with the husband as well. The patient ask good questions and just wanted reassurance. 12-09-2021: The patient sta

## 2022-02-05 ENCOUNTER — Other Ambulatory Visit: Payer: Self-pay | Admitting: Internal Medicine

## 2022-02-05 DIAGNOSIS — F419 Anxiety disorder, unspecified: Secondary | ICD-10-CM

## 2022-02-05 NOTE — Telephone Encounter (Signed)
Requested Prescriptions  ?Pending Prescriptions Disp Refills  ?? citalopram (CELEXA) 20 MG tablet [Pharmacy Med Name: CITALOPRAM '20MG'$  TABLETS] 90 tablet 0  ?  Sig: TAKE 1 TABLET(20 MG) BY MOUTH DAILY  ?  ? Psychiatry:  Antidepressants - SSRI Passed - 02/05/2022  2:49 PM  ?  ?  Passed - Valid encounter within last 6 months  ?  Recent Outpatient Visits   ?      ? 2 months ago Primary hypertension  ? [facility] [place], [place] W, NP  ? 2 months ago Mass of right side of neck  ? [facility] [place], [place] W, NP  ? 3 months ago Sore throat  ? [facility] [place], [place] W, NP  ? 4 months ago Acute right-sided low back pain without sciatica  ? [facility] [place], [place] W, NP  ? 5 months ago Frequent headaches  ? [facility] [place], Coralie Keens, NP  ?  ?  ?Future Appointments   ?        ? In 2 weeks Baity, Coralie Keens, NP [facility], [place]  ?  ? ?  ?  ?  ? ? ?

## 2022-02-11 ENCOUNTER — Inpatient Hospital Stay (HOSPITAL_BASED_OUTPATIENT_CLINIC_OR_DEPARTMENT_OTHER): Payer: PPO | Admitting: Internal Medicine

## 2022-02-11 ENCOUNTER — Other Ambulatory Visit: Payer: Self-pay | Admitting: Internal Medicine

## 2022-02-11 ENCOUNTER — Encounter: Payer: Self-pay | Admitting: Internal Medicine

## 2022-02-11 ENCOUNTER — Inpatient Hospital Stay: Payer: PPO | Attending: Internal Medicine

## 2022-02-11 DIAGNOSIS — F419 Anxiety disorder, unspecified: Secondary | ICD-10-CM | POA: Insufficient documentation

## 2022-02-11 DIAGNOSIS — D751 Secondary polycythemia: Secondary | ICD-10-CM

## 2022-02-11 DIAGNOSIS — G4730 Sleep apnea, unspecified: Secondary | ICD-10-CM | POA: Diagnosis not present

## 2022-02-11 DIAGNOSIS — D696 Thrombocytopenia, unspecified: Secondary | ICD-10-CM

## 2022-02-11 DIAGNOSIS — I10 Essential (primary) hypertension: Secondary | ICD-10-CM | POA: Diagnosis not present

## 2022-02-11 DIAGNOSIS — E785 Hyperlipidemia, unspecified: Secondary | ICD-10-CM | POA: Diagnosis not present

## 2022-02-11 DIAGNOSIS — E039 Hypothyroidism, unspecified: Secondary | ICD-10-CM | POA: Diagnosis not present

## 2022-02-11 DIAGNOSIS — Z79899 Other long term (current) drug therapy: Secondary | ICD-10-CM | POA: Insufficient documentation

## 2022-02-11 LAB — COMPREHENSIVE METABOLIC PANEL
ALT: 13 U/L (ref 0–44)
AST: 14 U/L — ABNORMAL LOW (ref 15–41)
Albumin: 4.3 g/dL (ref 3.5–5.0)
Alkaline Phosphatase: 56 U/L (ref 38–126)
Anion gap: 7 (ref 5–15)
BUN: 17 mg/dL (ref 8–23)
CO2: 26 mmol/L (ref 22–32)
Calcium: 9.6 mg/dL (ref 8.9–10.3)
Chloride: 106 mmol/L (ref 98–111)
Creatinine, Ser: 0.84 mg/dL (ref 0.44–1.00)
GFR, Estimated: >60 mL/min (ref >60)
Glucose, Bld: 106 mg/dL — ABNORMAL HIGH (ref 70–99)
Potassium: 3.6 mmol/L (ref 3.5–5.1)
Sodium: 139 mmol/L (ref 135–145)
Total Bilirubin: 0.8 mg/dL (ref 0.3–1.2)
Total Protein: 7.5 g/dL (ref 6.5–8.1)

## 2022-02-11 LAB — CBC WITH DIFFERENTIAL/PLATELET
Abs Immature Granulocytes: 0.03 10*3/uL (ref 0.00–0.07)
Basophils Absolute: 0.1 10*3/uL (ref 0.0–0.1)
Basophils Relative: 1 %
Eosinophils Absolute: 0.1 10*3/uL (ref 0.0–0.5)
Eosinophils Relative: 2 %
HCT: 45.1 % (ref 36.0–46.0)
Hemoglobin: 15.4 g/dL — ABNORMAL HIGH (ref 12.0–15.0)
Immature Granulocytes: 0 %
Lymphocytes Relative: 33 %
Lymphs Abs: 2.8 10*3/uL (ref 0.7–4.0)
MCH: 33.5 pg (ref 26.0–34.0)
MCHC: 34.1 g/dL (ref 30.0–36.0)
MCV: 98 fL (ref 80.0–100.0)
Monocytes Absolute: 0.8 10*3/uL (ref 0.1–1.0)
Monocytes Relative: 9 %
Neutro Abs: 4.7 10*3/uL (ref 1.7–7.7)
Neutrophils Relative %: 55 %
Platelets: 139 10*3/uL — ABNORMAL LOW (ref 150–400)
RBC: 4.6 MIL/uL (ref 3.87–5.11)
RDW: 12.7 % (ref 11.5–15.5)
WBC: 8.5 10*3/uL (ref 4.0–10.5)
nRBC: 0 % (ref 0.0–0.2)

## 2022-02-11 LAB — HEPATITIS B CORE ANTIBODY, IGM: Hep B C IgM: NONREACTIVE

## 2022-02-11 LAB — TECHNOLOGIST SMEAR REVIEW: Plt Morphology: ADEQUATE

## 2022-02-11 LAB — LACTATE DEHYDROGENASE: LDH: 135 U/L (ref 98–192)

## 2022-02-11 LAB — HEPATITIS B SURFACE ANTIGEN: Hepatitis B Surface Ag: NONREACTIVE

## 2022-02-11 NOTE — Progress Notes (Signed)
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 Hb16  HISTORY OF PRESENTING ILLNESS: Accompanied by her husband in wheelchair.;  Patient ambulating independently.  [name] 81 y.o.  female is here for a follow up of thrombocytopenia.  Complains of rash on the upper and lower extremities no itchy.  Going on for the last many months.  Patient admits to easy bruising.  Otherwise denies any gum bleeding or nosebleeds.  Review of Systems  Constitutional:  Positive for malaise/fatigue. Negative for chills, diaphoresis, fever and weight loss.  HENT:  Negative for nosebleeds and sore throat.   Eyes:  Negative for double vision.  Respiratory:  Negative for cough, hemoptysis, sputum production, shortness of breath and wheezing.   Cardiovascular:  Negative for chest pain, palpitations, orthopnea and leg swelling.  Gastrointestinal:  Negative for abdominal pain, blood in stool, constipation, diarrhea, heartburn, melena, nausea and vomiting.  Genitourinary:  Negative for dysuria, frequency and urgency.  Musculoskeletal:  Positive for back pain and joint pain.  Skin: Negative.  Negative for itching and rash.  Neurological:  Negative for dizziness, tingling, focal weakness, weakness and headaches.  Endo/Heme/Allergies:  Does not bruise/bleed easily.  Psychiatric/Behavioral:  Negative for depression. The patient is not nervous/anxious and does not have insomnia.     MEDICAL HISTORY:  Past Medical History:  Diagnosis Date   Allergy    Anemia    Anxiety    Cancer ([place])    skin cancer   Hyperlipidemia    Hypertension    Hypothyroidism    Sciatica     Sleep apnea    does not use cpap   Thrombocytopenia (HCC)    Thyroid disease     SURGICAL HISTORY: Past Surgical History:  Procedure Laterality Date   ABDOMINAL HYSTERECTOMY     APPENDECTOMY     BACK SURGERY     lower   BILATERAL CARPAL TUNNEL RELEASE     BREAST CYST ASPIRATION Left    neg   COLONOSCOPY     COLONOSCOPY WITH PROPOFOL N/A 04/23/2021   Procedure: COLONOSCOPY WITH PROPOFOL;  Surgeon: Virgel Manifold, MD;  Location: [facility];  Service: Endoscopy;  Laterality: N/A;   ENTROPIAN REPAIR Left 06/08/2020   Procedure: ENTROPION REPAIR, SUTURES ENTROPION REPAIR, EXTENSIVE LEFT;  Surgeon: Karle Starch, MD;  Location: [facility];  Service: Ophthalmology;  Laterality: Left;   JOINT REPLACEMENT     KNEE SURGERY Bilateral    KNEE SURGERY     OPEN REDUCTION INTERNAL FIXATION (ORIF) DISTAL RADIAL FRACTURE Left 06/23/2019   Procedure: OPEN REDUCTION INTERNAL FIXATION (ORIF) DISTAL RADIAL FRACTURE;  Surgeon: Hessie Knows, MD;  Location: [facility];  Service: Orthopedics;  Laterality: Left;   PARATHYROIDECTOMY  04/27/2020   SPINAL CORD STIMULATOR IMPLANT Right    THYROIDECTOMY Right 04/27/2020   Procedure: PARATHYROIDECTOMY;  Surgeon: Johnathan Hausen, MD;  Location: [facility];  Service: General;  Laterality: Right;   TOTAL KNEE REVISION Right 08/13/2015   Procedure: TOTAL KNEE REVISION;  Surgeon: Paralee Cancel, MD;  Location: [facility];  Service: Orthopedics;  Laterality: Right;    SOCIAL HISTORY: Social History   Socioeconomic History   Marital status: Married    Spouse name: Not on file  Number of children: 4   Years of education: Not on file   Highest education level: Not on file  Occupational History   Occupation: retired   Occupation: Retired  Tobacco Use   Smoking status: Every Day    Packs/day: 0.50    Years: 40.00    Pack years: 20.00    Types: Cigarettes   Smokeless tobacco: Never  Vaping Use   Vaping Use: Never used  Substance and Sexual Activity    Alcohol use: No   Drug use: No   Sexual activity: Not Currently  Other Topics Concern   Not on file  Social History Narrative   Not on file   Social Determinants of Health   Financial Resource Strain: Low Risk    Difficulty of Paying Living Expenses: Not hard at all  Food Insecurity: No Food Insecurity   Worried About Charity fundraiser in the Last Year: Never true   [place] in the Last Year: Never true  Transportation Needs: No Transportation Needs   Lack of Transportation (Medical): No   Lack of Transportation (Non-Medical): No  Physical Activity: Sufficiently Active   Days of Exercise per Week: 5 days   Minutes of Exercise per Session: 30 min  Stress: No Stress Concern Present   Feeling of Stress : Not at all  Social Connections: Moderately Integrated   Frequency of Communication with Friends and Family: Once a week   Frequency of Social Gatherings with Friends and Family: Once a week   Attends Religious Services: More than 4 times per year   Active Member of [employer] or Organizations: Yes   Attends Music therapist: More than 4 times per year   Marital Status: Married  Human resources officer Violence: Not At Risk   Fear of Current or Ex-Partner: No   Emotionally Abused: No   Physically Abused: No   Sexually Abused: No    FAMILY HISTORY: Family History  Problem Relation Age of Onset   Dementia Mother    Dementia Sister    Heart attack Brother    Diabetes Brother    Breast cancer Paternal Aunt    Heart attack Son    Hypercalcemia Neg Hx    Cancer Neg Hx     ALLERGIES:  is allergic to codeine, morphine, ace inhibitors, and adhesive [tape].  MEDICATIONS:  Current Outpatient Medications  Medication Sig Dispense Refill   acetaminophen (TYLENOL) 500 MG tablet Take 500 mg by mouth every 6 (six) hours as needed for moderate pain.      amLODipine (NORVASC) 10 MG tablet Take 1 tablet (10 mg total) by mouth daily. 90 tablet 1   atorvastatin (LIPITOR)  10 MG tablet TAKE 1 TABLET(10 MG) BY MOUTH DAILY 90 tablet 1   citalopram (CELEXA) 20 MG tablet TAKE 1 TABLET(20 MG) BY MOUTH DAILY 90 tablet 0   levothyroxine (SYNTHROID) 150 MCG tablet TAKE 1 TABLET(150 MCG) BY MOUTH DAILY 90 tablet 1   losartan (COZAAR) 100 MG tablet TAKE 1 TABLET(100 MG) BY MOUTH DAILY 90 tablet 0   metoprolol succinate (TOPROL-XL) 50 MG 24 hr tablet Take with or immediately following a meal. 90 tablet 1   Multiple Vitamin (MULTIVITAMIN) capsule Take 1 capsule by mouth daily.     rOPINIRole (REQUIP) 0.5 MG tablet TAKE 1 TABLET(0.5 MG) BY MOUTH AT BEDTIME 90 tablet 0   traZODone (DESYREL) 50 MG tablet TAKE 1 TABLET(50 MG) BY MOUTH AT BEDTIME AS NEEDED FOR SLEEP 90 tablet 0  cetirizine (ZYRTEC) 10 MG tablet TAKE 1 TABLET(10 MG) BY MOUTH DAILY (Patient not taking: Reported on 02/11/2022) 90 tablet 0   tizanidine (ZANAFLEX) 2 MG capsule Take 1 capsule (2 mg total) by mouth at bedtime as needed for muscle spasms. (Patient not taking: Reported on 02/11/2022) 15 capsule 0   No current facility-administered medications for this visit.    PHYSICAL EXAMINATION:  Vitals:   02/11/22 1319  BP: (!) 148/71  Pulse: 61  Resp: 18  Temp: 97.8 F (36.6 C)   Filed Weights   02/11/22 1319  Weight: 168 lb 3.2 oz (76.3 kg)   Maculopapular rash bilateral upper lower extremities.  Physical Exam Vitals and nursing note reviewed.  HENT:     Head: Normocephalic and atraumatic.     Mouth/Throat:     Pharynx: Oropharynx is clear.  Eyes:     Extraocular Movements: Extraocular movements intact.     Pupils: Pupils are equal, round, and reactive to light.  Cardiovascular:     Rate and Rhythm: Normal rate and regular rhythm.  Pulmonary:     Comments: Decreased breath sounds bilaterally.  Abdominal:     Palpations: Abdomen is soft.  Musculoskeletal:        General: Normal range of motion.     Cervical back: Normal range of motion.  Skin:    General: Skin is warm.  Neurological:      General: No focal deficit present.     Mental Status: She is alert and oriented to person, place, and time.  Psychiatric:        Behavior: Behavior normal.        Judgment: Judgment normal.     LABORATORY DATA:  I have reviewed the data as listed Lab Results  Component Value Date   WBC 8.5 02/11/2022   HGB 15.4 (H) 02/11/2022   HCT 45.1 02/11/2022   MCV 98.0 02/11/2022   PLT 139 (L) 02/11/2022   Recent Labs    05/20/21 0929 07/29/21 1359 02/11/22 1307  NA 142 144 139  K 4.2 4.0 3.6  CL 109 108 106  CO2 '29 27 26  '$ GLUCOSE 89 85 106*  BUN '14 18 17  '$ CREATININE 0.75 0.86 0.84  CALCIUM 9.8 10.4 9.6  GFRNONAA  --   --  >60  PROT  --  6.9 7.5  ALBUMIN  --   --  4.3  AST  --  13 14*  ALT  --  10 13  ALKPHOS  --   --  56  BILITOT  --  0.8 0.8    RADIOGRAPHIC STUDIES: I have personally reviewed the radiological images as listed and agreed with the findings in the report. No results found.  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  Platelets today are 139.  Clinically stable-not recommend any treatment unless platelets are dropping less than 50.  #Patient's skin rash maculopapular/purpura not related to thrombocytopenia. ?  Senile purpura versus others.  Would recommend dermatology evaluation/defer to PCP.   #Elevated hematocrit-likely reactive/do not suspect any primary bone marrow process.JAK2- pending.  Again low clinical suspicion for any PV.   # #Since patient is clinically stable I think is reasonable for the patient to follow-up with PCP/can follow-up with [place] as needed.  Patient comfortable with the plan; to call us if any questions or concerns in the interim.  # DISPOSITION: # follow up as needed- Dr.B     All  questions were answered. The patient knows to call the clinic with any problems, questions or concerns.       Cammie Sickle, MD 02/11/2022  2:06 PM

## 2022-02-11 NOTE — Progress Notes (Signed)
Patient has noticed worsening red places on her arms and legs.  Also having an increase in fatigue and reports that she "just doesn't feel like herself".

## 2022-02-11 NOTE — Assessment & Plan Note (Addendum)
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  Platelets today are 139.  Clinically stable-not recommend any treatment unless platelets are dropping less than 50.  #Patient's skin rash maculopapular/purpura not related to thrombocytopenia. ?  Senile purpura versus others.  Would recommend dermatology evaluation/defer to PCP.   #Elevated hematocrit-likely reactive/do not suspect any primary bone marrow process.JAK2- pending.  Again low clinical suspicion for any PV.   # #Since patient is clinically stable I think is reasonable for the patient to follow-up with PCP/can follow-up with [place] as needed.  Patient comfortable with the plan; to call us if any questions or concerns in the interim.  # DISPOSITION: # follow up as needed- Dr.B

## 2022-02-12 NOTE — Telephone Encounter (Signed)
Requested medication (s) are due for refill today: no  Requested medication (s) are on the active medication list: no  Last refill:  11/05/21  Future visit scheduled: yes  Notes to clinic:  Unable to refill per protocol, Rx expired.  Medication was discontinued 11/26/21 by PCP.     Requested Prescriptions  Pending Prescriptions Disp Refills   amLODipine (NORVASC) 5 MG tablet [Pharmacy Med Name: AMLODIPINE BESYLATE '5MG'$  TABLETS] 90 tablet 0    Sig: TAKE 1 TABLET(5 MG) BY MOUTH DAILY     Cardiovascular: Calcium Channel Blockers 2 Failed - 02/11/2022  2:15 PM      Failed - Last BP in normal range    BP Readings from Last 1 Encounters:  02/11/22 (!) 148/71         Passed - Last Heart Rate in normal range    Pulse Readings from Last 1 Encounters:  02/11/22 61         Passed - Valid encounter within last 6 months    Recent Outpatient Visits           2 months ago Primary hypertension   [place], Coralie Keens, NP   2 months ago Mass of right side of neck   [facility] [place], Coralie Keens, NP   3 months ago Sore throat   [facility] [place], [place] W, NP   4 months ago Acute right-sided low back pain without sciatica   [facility] [place], Coralie Keens, NP   5 months ago Frequent headaches   [facility] [place], Coralie Keens, NP       Future Appointments             In 1 week [place], Coralie Keens, NP [facility]             Signed Prescriptions Disp Refills   losartan (COZAAR) 100 MG tablet 90 tablet 0    Sig: TAKE 1 TABLET(100 MG) BY MOUTH DAILY     Cardiovascular:  Angiotensin Receptor Blockers Failed - 02/11/2022  2:15 PM      Failed - Last BP in normal range    BP Readings from Last 1 Encounters:  02/11/22 (!) 148/71         Passed - Cr in normal range and within 180 days    Creat  Date Value Ref Range Status  07/29/2021 0.86 0.60 - 1.00 mg/dL Final   Creatinine, Ser   Date Value Ref Range Status  02/11/2022 0.84 0.44 - 1.00 mg/dL Final         Passed - K in normal range and within 180 days    Potassium  Date Value Ref Range Status  02/11/2022 3.6 3.5 - 5.1 mmol/L Final  08/26/2012 3.3 (L) 3.5 - 5.1 mmol/L Final         Passed - Patient is not pregnant      Passed - Valid encounter within last 6 months    Recent Outpatient Visits           2 months ago Primary hypertension   [place], Coralie Keens, NP   2 months ago Mass of right side of neck   [facility] [place], Coralie Keens, NP   3 months ago Sore throat   [facility] [place], [place] W, NP   4 months ago Acute right-sided low back pain without sciatica   [place]  [facility] [place], Coralie Keens, NP   5 months ago Frequent headaches   [facility] [place], Coralie Keens, NP       Future Appointments             In 1 week Garnette Gunner, Coralie Keens, NP [facility]

## 2022-02-12 NOTE — Telephone Encounter (Signed)
Requested Prescriptions  Pending Prescriptions Disp Refills  . amLODipine (NORVASC) 5 MG tablet [Pharmacy Med Name: AMLODIPINE BESYLATE '5MG'$  TABLETS] 90 tablet 0    Sig: TAKE 1 TABLET(5 MG) BY MOUTH DAILY     Cardiovascular: Calcium Channel Blockers 2 Failed - 02/11/2022  2:15 PM      Failed - Last BP in normal range    BP Readings from Last 1 Encounters:  02/11/22 (!) 148/71         Passed - Last Heart Rate in normal range    Pulse Readings from Last 1 Encounters:  02/11/22 61         Passed - Valid encounter within last 6 months    Recent Outpatient Visits          2 months ago Primary hypertension   [place], Coralie Keens, NP   2 months ago Mass of right side of neck   [facility] [place], Coralie Keens, NP   3 months ago Sore throat   [facility] [place], [place] W, NP   4 months ago Acute right-sided low back pain without sciatica   [facility] [place], Coralie Keens, NP   5 months ago Frequent headaches   [facility] [place], Coralie Keens, NP      Future Appointments            In 1 week [place], Coralie Keens, NP [facility], [place]           . losartan (COZAAR) 100 MG tablet [Pharmacy Med Name: LOSARTAN '100MG'$  TABLETS] 90 tablet 0    Sig: TAKE 1 TABLET(100 MG) BY MOUTH DAILY     Cardiovascular:  Angiotensin Receptor Blockers Failed - 02/11/2022  2:15 PM      Failed - Last BP in normal range    BP Readings from Last 1 Encounters:  02/11/22 (!) 148/71         Passed - Cr in normal range and within 180 days    Creat  Date Value Ref Range Status  07/29/2021 0.86 0.60 - 1.00 mg/dL Final   Creatinine, Ser  Date Value Ref Range Status  02/11/2022 0.84 0.44 - 1.00 mg/dL Final         Passed - K in normal range and within 180 days    Potassium  Date Value Ref Range Status  02/11/2022 3.6 3.5 - 5.1 mmol/L Final  08/26/2012 3.3 (L) 3.5 - 5.1 mmol/L Final         Passed - Patient is not  pregnant      Passed - Valid encounter within last 6 months    Recent Outpatient Visits          2 months ago Primary hypertension   [place], Coralie Keens, NP   2 months ago Mass of right side of neck   [facility] [place], Coralie Keens, NP   3 months ago Sore throat   [facility] [place], [place] W, NP   4 months ago Acute right-sided low back pain without sciatica   [facility] [place], Coralie Keens, NP   5 months ago Frequent headaches   [facility] [place], Coralie Keens, NP      Future Appointments            In 1 week Garnette Gunner, Coralie Keens, NP [facility], [facility]

## 2022-02-18 LAB — JAK2 GENOTYPR

## 2022-02-19 ENCOUNTER — Ambulatory Visit (INDEPENDENT_AMBULATORY_CARE_PROVIDER_SITE_OTHER): Payer: PPO | Admitting: Internal Medicine

## 2022-02-19 ENCOUNTER — Encounter: Payer: Self-pay | Admitting: Internal Medicine

## 2022-02-19 VITALS — BP 118/70 | HR 60 | Temp 97.5°F | Wt 167.0 lb

## 2022-02-19 DIAGNOSIS — F17210 Nicotine dependence, cigarettes, uncomplicated: Secondary | ICD-10-CM | POA: Diagnosis not present

## 2022-02-19 DIAGNOSIS — E039 Hypothyroidism, unspecified: Secondary | ICD-10-CM | POA: Diagnosis not present

## 2022-02-19 DIAGNOSIS — Z0001 Encounter for general adult medical examination with abnormal findings: Secondary | ICD-10-CM

## 2022-02-19 DIAGNOSIS — I10 Essential (primary) hypertension: Secondary | ICD-10-CM | POA: Diagnosis not present

## 2022-02-19 DIAGNOSIS — E6609 Other obesity due to excess calories: Secondary | ICD-10-CM

## 2022-02-19 DIAGNOSIS — D649 Anemia, unspecified: Secondary | ICD-10-CM

## 2022-02-19 DIAGNOSIS — Z1159 Encounter for screening for other viral diseases: Secondary | ICD-10-CM

## 2022-02-19 DIAGNOSIS — E782 Mixed hyperlipidemia: Secondary | ICD-10-CM

## 2022-02-19 DIAGNOSIS — Z6831 Body mass index (BMI) 31.0-31.9, adult: Secondary | ICD-10-CM

## 2022-02-19 DIAGNOSIS — E785 Hyperlipidemia, unspecified: Secondary | ICD-10-CM

## 2022-02-19 DIAGNOSIS — F419 Anxiety disorder, unspecified: Secondary | ICD-10-CM

## 2022-02-19 NOTE — Progress Notes (Addendum)
Subjective:    Patient ID: [name], female    DOB: June 17, 1941, 81 y.o.   MRN: [id number]  HPI  Patient presents to clinic today for her annual exam.  Flu: 05/2021 Tetanus: unsure COVID: Pfizer x4 Pneumovax: 05/2020 Prevnar: 07/2018 Shingrix: never Pap smear: Hysterectomy Mammogram: 12/2021 Bone density: 12/2021 Colon screening: 04/2021 Vision screening: as needed Dentist: as needed, dentures  Diet: She does eat meat. She consumes fruits and veggies. She eats fried foods. She drinks mostly water, tea, coffee. Exercise: Walking  Review of Systems     Past Medical History:  Diagnosis Date   Allergy    Anemia    Anxiety    Cancer ([place])    skin cancer   Hyperlipidemia    Hypertension    Hypothyroidism    Sciatica    Sleep apnea    does not use cpap   Thrombocytopenia (HCC)    Thyroid disease     Current Outpatient Medications  Medication Sig Dispense Refill   acetaminophen (TYLENOL) 500 MG tablet Take 500 mg by mouth every 6 (six) hours as needed for moderate pain.      amLODipine (NORVASC) 10 MG tablet Take 1 tablet (10 mg total) by mouth daily. 90 tablet 1   atorvastatin (LIPITOR) 10 MG tablet TAKE 1 TABLET(10 MG) BY MOUTH DAILY 90 tablet 1   cetirizine (ZYRTEC) 10 MG tablet TAKE 1 TABLET(10 MG) BY MOUTH DAILY (Patient not taking: Reported on 02/11/2022) 90 tablet 0   citalopram (CELEXA) 20 MG tablet TAKE 1 TABLET(20 MG) BY MOUTH DAILY 90 tablet 0   levothyroxine (SYNTHROID) 150 MCG tablet TAKE 1 TABLET(150 MCG) BY MOUTH DAILY 90 tablet 1   losartan (COZAAR) 100 MG tablet TAKE 1 TABLET(100 MG) BY MOUTH DAILY 90 tablet 0   metoprolol succinate (TOPROL-XL) 50 MG 24 hr tablet Take with or immediately following a meal. 90 tablet 1   Multiple Vitamin (MULTIVITAMIN) capsule Take 1 capsule by mouth daily.     rOPINIRole (REQUIP) 0.5 MG tablet TAKE 1 TABLET(0.5 MG) BY MOUTH AT BEDTIME 90 tablet 0   tizanidine (ZANAFLEX) 2 MG capsule Take 1 capsule (2 mg total) by  mouth at bedtime as needed for muscle spasms. (Patient not taking: Reported on 02/11/2022) 15 capsule 0   traZODone (DESYREL) 50 MG tablet TAKE 1 TABLET(50 MG) BY MOUTH AT BEDTIME AS NEEDED FOR SLEEP 90 tablet 0   No current facility-administered medications for this visit.    Allergies  Allergen Reactions   Codeine Nausea Only    Tolerates oxycodone   Morphine Nausea And Vomiting   Ace Inhibitors Cough   Adhesive [Tape] Other (See Comments) and Rash    whelps    Family History  Problem Relation Age of Onset   Dementia Mother    Dementia Sister    Heart attack Brother    Diabetes Brother    Breast cancer Paternal Aunt    Heart attack Son    Hypercalcemia Neg Hx    Cancer Neg Hx     Social History   Socioeconomic History   Marital status: Married    Spouse name: Not on file   Number of children: 4   Years of education: Not on file   Highest education level: Not on file  Occupational History   Occupation: retired   Occupation: Retired  Tobacco Use   Smoking status: Every Day    Packs/day: 0.50    Years: 40.00    Pack years: 20.00  Types: Cigarettes   Smokeless tobacco: Never  Vaping Use   Vaping Use: Never used  Substance and Sexual Activity   Alcohol use: No   Drug use: No   Sexual activity: Not Currently  Other Topics Concern   Not on file  Social History Narrative   Not on file   Social Determinants of Health   Financial Resource Strain: Low Risk    Difficulty of Paying Living Expenses: Not hard at all  Food Insecurity: No Food Insecurity   Worried About Charity fundraiser in the Last Year: Never true   [place] in the Last Year: Never true  Transportation Needs: No Transportation Needs   Lack of Transportation (Medical): No   Lack of Transportation (Non-Medical): No  Physical Activity: Sufficiently Active   Days of Exercise per Week: 5 days   Minutes of Exercise per Session: 30 min  Stress: No Stress Concern Present   Feeling of Stress  : Not at all  Social Connections: Moderately Integrated   Frequency of Communication with Friends and Family: Once a week   Frequency of Social Gatherings with Friends and Family: Once a week   Attends Religious Services: More than 4 times per year   Active Member of [employer] or Organizations: Yes   Attends Music therapist: More than 4 times per year   Marital Status: Married  Human resources officer Violence: Not At Risk   Fear of Current or Ex-Partner: No   Emotionally Abused: No   Physically Abused: No   Sexually Abused: No     Constitutional: Denies fever, malaise, fatigue, headache or abrupt weight changes.  HEENT: Denies eye pain, eye redness, ear pain, ringing in the ears, wax buildup, runny nose, nasal congestion, bloody nose, or sore throat. Respiratory: Denies difficulty breathing, shortness of breath, cough or sputum production.   Cardiovascular: Denies chest pain, chest tightness, palpitations or swelling in the hands or feet.  Gastrointestinal: Denies abdominal pain, bloating, constipation, diarrhea or blood in the stool.  GU: Denies urgency, frequency, pain with urination, burning sensation, blood in urine, odor or discharge. Musculoskeletal: Patient reports joint pain.  Denies decrease in range of motion, difficulty with gait, muscle pain or joint swelling.  Skin: Denies redness, rashes, lesions or ulcercations.  Neurological: Patient reports intermittent lightheadedness, insomnia and restless legs.  Denies dizziness, difficulty with memory, difficulty with speech or problems with balance and coordination.  Psych: Patient has a history of anxiety.  Denies depression, SI/HI.  No other specific complaints in a complete review of systems (except as listed in HPI above).  Objective:   Physical Exam  BP 118/70 (BP Location: Left Arm, Patient Position: Sitting, Cuff Size: Large)   Pulse 60   Temp (!) 97.5 F (36.4 C) (Temporal)   Wt 167 lb (75.8 kg)   LMP  (LMP  Unknown)   SpO2 100%   BMI 31.04 kg/m   Wt Readings from Last 3 Encounters:  02/11/22 168 lb 3.2 oz (76.3 kg)  12/16/21 168 lb 3.2 oz (76.3 kg)  12/03/21 166 lb (75.3 kg)    General: Appears her stated age, obese, in NAD. Skin: Warm, dry and intact.  HEENT: Head: normal shape and size; Eyes: PERRLA and EOMs intact;  Neck:  Neck supple, trachea midline. No masses, lumps or thyromegaly present.  Cardiovascular: Normal rate and rhythm. S1,S2 noted.  No murmur, rubs or gallops noted. No JVD or BLE edema. No carotid bruits noted. Pulmonary/Chest: Normal effort and positive  vesicular breath sounds. No respiratory distress. No wheezes, rales or ronchi noted.  Abdomen: Soft and nontender. Normal bowel sounds.  Musculoskeletal: Kyphotic. Strength 5/5 BUE/BLE. Gait slow and steady without device. Neurological: Alert and oriented. Cranial nerves II-XII grossly intact. Coordination normal.  Psychiatric: Mood and affect normal. Anxious appearing. Judgment and thought content normal.    BMET    Component Value Date/Time   NA 139 02/11/2022 1307   NA 145 (H) 12/21/2017 1017   NA 139 08/26/2012 2035   K 3.6 02/11/2022 1307   K 3.3 (L) 08/26/2012 2035   CL 106 02/11/2022 1307   CL 105 08/26/2012 2035   CO2 26 02/11/2022 1307   CO2 29 08/26/2012 2035   GLUCOSE 106 (H) 02/11/2022 1307   GLUCOSE 97 08/26/2012 2035   BUN 17 02/11/2022 1307   BUN 13 12/21/2017 1017   BUN 13 08/26/2012 2035   CREATININE 0.84 02/11/2022 1307   CREATININE 0.86 07/29/2021 1359   CALCIUM 9.6 02/11/2022 1307   CALCIUM 9.9 08/26/2012 2035   GFRNONAA >60 02/11/2022 1307   GFRNONAA 66 09/11/2020 1147   GFRAA 76 09/11/2020 1147    Lipid Panel     Component Value Date/Time   CHOL 209 (H) 07/29/2021 1359   CHOL 181 12/21/2017 1017   TRIG 191 (H) 07/29/2021 1359   HDL 54 07/29/2021 1359   HDL 50 12/21/2017 1017   CHOLHDL 3.9 07/29/2021 1359   LDLCALC 124 (H) 07/29/2021 1359    CBC    Component Value  Date/Time   WBC 8.5 02/11/2022 1307   RBC 4.60 02/11/2022 1307   HGB 15.4 (H) 02/11/2022 1307   HGB 14.8 12/21/2017 1017   HCT 45.1 02/11/2022 1307   HCT 42.5 12/21/2017 1017   PLT 139 (L) 02/11/2022 1307   PLT 141 (L) 12/21/2017 1017   MCV 98.0 02/11/2022 1307   MCV 93 12/21/2017 1017   MCV 92 08/26/2012 2035   MCH 33.5 02/11/2022 1307   MCHC 34.1 02/11/2022 1307   RDW 12.7 02/11/2022 1307   RDW 12.8 12/21/2017 1017   RDW 13.1 08/26/2012 2035   LYMPHSABS 2.8 02/11/2022 1307   LYMPHSABS 2.5 12/21/2017 1017   MONOABS 0.8 02/11/2022 1307   EOSABS 0.1 02/11/2022 1307   EOSABS 0.3 12/21/2017 1017   BASOSABS 0.1 02/11/2022 1307   BASOSABS 0.0 12/21/2017 1017    Hgb A1C Lab Results  Component Value Date   HGBA1C 5.3 05/08/2021           Assessment & Plan:   Preventative Health Maintenance:  Encouraged her to get a flu shot in the fall She declines tetanus for financial reasons, advised if she gets bit or cut to go get this done Pneumovax and Prevnar UTD Discussed Shingrix vaccine, she will check coverage with her insurance company and get this at the pharmacy if she would like to have this done She no longer needs Pap smears Mammogram UTD Bone density UTD Colon screening UTD Encouraged her to consume a balanced diet and exercise regimen Advised her to see an eye doctor and dentist annually We will check lipid and hep C today  RTC in 6 months, follow-up chronic conditions [name], NP

## 2022-02-19 NOTE — Patient Instructions (Signed)

## 2022-02-19 NOTE — Assessment & Plan Note (Signed)
Encouraged diet and exercise for weight loss ?

## 2022-02-20 LAB — LIPID PANEL
Cholesterol: 207 mg/dL — ABNORMAL HIGH (ref <200)
HDL: 48 mg/dL — ABNORMAL LOW (ref >=50)
LDL Cholesterol (Calc): 127 mg/dL (calc) — ABNORMAL HIGH
Non-HDL Cholesterol (Calc): 159 mg/dL (calc) — ABNORMAL HIGH (ref <130)
Total CHOL/HDL Ratio: 4.3 (calc) (ref <5.0)
Triglycerides: 208 mg/dL — ABNORMAL HIGH (ref <150)

## 2022-02-20 LAB — HEPATITIS C ANTIBODY
Hepatitis C Ab: NONREACTIVE
SIGNAL TO CUT-OFF: 0.14 (ref <1.00)

## 2022-02-20 NOTE — Addendum Note (Signed)
Addended by: [name] on: 02/20/2022 07:00 AM   Modules accepted: Orders

## 2022-02-21 ENCOUNTER — Other Ambulatory Visit: Payer: Self-pay

## 2022-02-21 MED ORDER — ATORVASTATIN CALCIUM 20 MG PO TABS: 20.0000 mg | ORAL_TABLET | Freq: Every day | ORAL | 0 refills | Status: DC

## 2022-02-21 NOTE — Telephone Encounter (Signed)
-----   Message from [name], NP sent at 02/20/2022  7:00 AM EDT ----- Cholesterol is worse than prior..  Please verify that she is taking atorvastatin as prescribed.  If so, increase to 20 mg daily.  Reinforced low saturated fat diet.  Please have her schedule a lab only appointment in 3 months for repeat lipid panel.

## 2022-02-21 NOTE — Telephone Encounter (Signed)
Pt advised.  She states she is taking atorvastatin regularly.  She agreed to increase to '20mg'$  daily.    Lab visit scheduled for 05/29/2022 at 8:15  Thanks,   -Mickel Baas

## 2022-03-21 DIAGNOSIS — Z85828 Personal history of other malignant neoplasm of skin: Secondary | ICD-10-CM | POA: Diagnosis not present

## 2022-03-21 DIAGNOSIS — D485 Neoplasm of uncertain behavior of skin: Secondary | ICD-10-CM | POA: Diagnosis not present

## 2022-03-21 DIAGNOSIS — D0472 Carcinoma in situ of skin of left lower limb, including hip: Secondary | ICD-10-CM | POA: Diagnosis not present

## 2022-03-21 DIAGNOSIS — D2272 Melanocytic nevi of left lower limb, including hip: Secondary | ICD-10-CM | POA: Diagnosis not present

## 2022-03-21 DIAGNOSIS — D2261 Melanocytic nevi of right upper limb, including shoulder: Secondary | ICD-10-CM | POA: Diagnosis not present

## 2022-03-21 DIAGNOSIS — D692 Other nonthrombocytopenic purpura: Secondary | ICD-10-CM | POA: Diagnosis not present

## 2022-03-21 DIAGNOSIS — D225 Melanocytic nevi of trunk: Secondary | ICD-10-CM | POA: Diagnosis not present

## 2022-03-21 DIAGNOSIS — L570 Actinic keratosis: Secondary | ICD-10-CM | POA: Diagnosis not present

## 2022-04-02 ENCOUNTER — Ambulatory Visit (INDEPENDENT_AMBULATORY_CARE_PROVIDER_SITE_OTHER): Payer: PPO | Admitting: Family Medicine

## 2022-04-02 ENCOUNTER — Encounter: Payer: Self-pay | Admitting: Family Medicine

## 2022-04-02 VITALS — BP 116/63 | HR 65 | Temp 97.7°F | Wt 167.0 lb

## 2022-04-02 DIAGNOSIS — G8929 Other chronic pain: Secondary | ICD-10-CM

## 2022-04-02 DIAGNOSIS — H7391 Unspecified disorder of tympanic membrane, right ear: Secondary | ICD-10-CM

## 2022-04-02 DIAGNOSIS — H9201 Otalgia, right ear: Secondary | ICD-10-CM | POA: Diagnosis not present

## 2022-04-02 MED ORDER — PREDNISONE 10 MG PO TABS: ORAL_TABLET | ORAL | 0 refills | Status: DC

## 2022-04-02 NOTE — Patient Instructions (Addendum)
Thank you for coming to the office today.  Referral to ENT specialist. The Right ear drum appears to have scar tissue and damage, and likely contributing to the symptoms on the R side and neck can be related as well with headache.  [facility] [address]  [place], [place] [address] Ph: [phone]  If not heard back by next Wednesday, you can call them to schedule next week.  Please schedule a Follow-up Appointment to: Return if symptoms worsen or fail to improve.  If you have any other questions or concerns, please feel free to call the office or send a message through [facility]. You may also schedule an earlier appointment if necessary.  Additionally, you may be receiving a survey about your experience at our office within a few days to 1 week by e-mail or mail. We value your feedback.  Nobie Putnam, DO [facility]

## 2022-04-02 NOTE — Progress Notes (Signed)
Subjective:    Patient ID: [name], female    DOB: 03/17/1941, 81 y.o.   MRN: [id number]  [name] is a 81 y.o. female presenting on 04/02/2022 for Neck Pain and Headache  PCP [name], FNP   HPI  Right Ear Pain / Pressure / Tinnitus Neck Pain and Headache Today she reports some chronic problems still bothering her have not resolved Past chart shows similar complaints but concern with her past history of parathyroidectomy and neck swelling or lymph node or mass. Neck US was done and negative for mass or concerns. She admits some hearing difficulty R >L and ringing / humming noise in R ear, and not had issue with trauma or injury to her ear. Admits radiating pain from R ear into neck and some headache associated.  Past Surgical History:  Procedure Laterality Date   ABDOMINAL HYSTERECTOMY     APPENDECTOMY     BACK SURGERY     lower   BILATERAL CARPAL TUNNEL RELEASE     BREAST CYST ASPIRATION Left    neg   COLONOSCOPY     COLONOSCOPY WITH PROPOFOL N/A 04/23/2021   Procedure: COLONOSCOPY WITH PROPOFOL;  Surgeon: Virgel Manifold, MD;  Location: [facility];  Service: Endoscopy;  Laterality: N/A;   ENTROPIAN REPAIR Left 06/08/2020   Procedure: ENTROPION REPAIR, SUTURES ENTROPION REPAIR, EXTENSIVE LEFT;  Surgeon: Karle Starch, MD;  Location: [facility];  Service: Ophthalmology;  Laterality: Left;   JOINT REPLACEMENT     KNEE SURGERY Bilateral    KNEE SURGERY     OPEN REDUCTION INTERNAL FIXATION (ORIF) DISTAL RADIAL FRACTURE Left 06/23/2019   Procedure: OPEN REDUCTION INTERNAL FIXATION (ORIF) DISTAL RADIAL FRACTURE;  Surgeon: Hessie Knows, MD;  Location: [facility];  Service: Orthopedics;  Laterality: Left;   PARATHYROIDECTOMY  04/27/2020   SPINAL CORD STIMULATOR IMPLANT Right    THYROIDECTOMY Right 04/27/2020   Procedure: PARATHYROIDECTOMY;  Surgeon: Johnathan Hausen, MD;  Location: [facility];  Service: General;  Laterality: Right;   TOTAL KNEE REVISION  Right 08/13/2015   Procedure: TOTAL KNEE REVISION;  Surgeon: Paralee Cancel, MD;  Location: [facility];  Service: Orthopedics;  Laterality: Right;       12/16/2021    1:50 PM 11/26/2021    2:34 PM 10/23/2021    9:35 AM  Depression screen PHQ 2/9  Decreased Interest 0 2 0  Down, Depressed, Hopeless '1 1 1  '$ PHQ - 2 Score '1 3 1  '$ Altered sleeping 0 0 0  Tired, decreased energy '3 3 3  '$ Change in appetite 0 0 0  Feeling bad or failure about yourself  0 0 0  Trouble concentrating 0 0 0  Moving slowly or fidgety/restless 0 0 0  Suicidal thoughts 0 0 0  PHQ-9 Score '4 6 4  '$ Difficult doing work/chores Not difficult at all Very difficult     Social History   Tobacco Use   Smoking status: Every Day    Packs/day: 0.50    Years: 40.00    Total pack years: 20.00    Types: Cigarettes   Smokeless tobacco: Never  Vaping Use   Vaping Use: Never used  Substance Use Topics   Alcohol use: No   Drug use: No    Review of Systems Per HPI unless specifically indicated above     Objective:    BP 116/63 (BP Location: Right Arm, Patient Position: Sitting, Cuff Size: Normal)   Pulse 65   Temp 97.7 F (36.5  C) (Temporal)   Wt 167 lb (75.8 kg)   LMP  (LMP Unknown)   SpO2 98%   BMI 31.04 kg/m   Wt Readings from Last 3 Encounters:  04/02/22 167 lb (75.8 kg)  02/19/22 167 lb (75.8 kg)  02/11/22 168 lb 3.2 oz (76.3 kg)    Physical Exam Vitals and nursing note reviewed.  Constitutional:      General: She is not in acute distress.    Appearance: Normal appearance. She is well-developed. She is not diaphoretic.     Comments: Well-appearing, comfortable, cooperative  HENT:     Head: Normocephalic and atraumatic.     Right Ear: Ear canal and external ear normal. There is no impacted cerumen.     Left Ear: Tympanic membrane, ear canal and external ear normal. There is no impacted cerumen.     Ears:     Comments: R TM with white appearing patchy scar tissue and appears to have a puncture and some area  of retraction difficult to discern normal R TM anatomy today. No effusion or erythema. Eyes:     General:        Right eye: No discharge.        Left eye: No discharge.     Conjunctiva/sclera: Conjunctivae normal.  Cardiovascular:     Rate and Rhythm: Normal rate.  Pulmonary:     Effort: Pulmonary effort is normal.  Skin:    General: Skin is warm and dry.     Findings: No erythema or rash.  Neurological:     Mental Status: She is alert and oriented to person, place, and time.  Psychiatric:        Mood and Affect: Mood normal.        Behavior: Behavior normal.        Thought Content: Thought content normal.     Comments: Well groomed, good eye contact, normal speech and thoughts    Results for orders placed or performed in visit on 02/19/22  Lipid panel  Result Value Ref Range   Cholesterol 207 (H) <200 mg/dL   HDL 48 (L) > OR = 50 mg/dL   Triglycerides 208 (H) <150 mg/dL   LDL Cholesterol (Calc) 127 (H) mg/dL (calc)   Total CHOL/HDL Ratio 4.3 <5.0 (calc)   Non-HDL Cholesterol (Calc) 159 (H) <130 mg/dL (calc)  Hepatitis C antibody  Result Value Ref Range   Hepatitis C Ab NON-REACTIVE NON-REACTIVE   SIGNAL TO CUT-OFF 0.14 <1.00      Assessment & Plan:   Problem List Items Addressed This Visit   None Visit Diagnoses     Chronic right ear pain    -  Primary   Relevant Medications   predniSONE (DELTASONE) 10 MG tablet   Other Relevant Orders   Ambulatory referral to ENT   Abnormal tympanic membrane of right ear       Relevant Orders   Ambulatory referral to ENT      Suspect her symptoms are originating from R ear TM pathology Pain can be associated with neck / headaches Trial on Prednisone taper for pain relief secondary to this problem   referral to ENT for consultation on chronic Right ear pain and pressure and abnormal tinnitus, exam shows R tympanic membrane scar tissue and abnormality of unknown etiology, she has radiating neck pain R>L, she has had prior  parathyroidectomy and some neck pain has had US Thyroid earlier this year negative 11/2021.   Orders Placed This Encounter  Procedures  Ambulatory referral to ENT    Referral Priority:   Routine    Referral Type:   Consultation    Referral Reason:   Specialty Services Required    Requested Specialty:   Otolaryngology    Number of Visits Requested:   1     Meds ordered this encounter  Medications   predniSONE (DELTASONE) 10 MG tablet    Sig: Take 6 tabs with breakfast Day 1, 5 tabs Day 2, 4 tabs Day 3, 3 tabs Day 4, 2 tabs Day 5, 1 tab Day 6.    Dispense:  21 tablet    Refill:  0      Follow up plan: Return if symptoms worsen or fail to improve.  Nobie Putnam, [facility] 04/02/2022, 10:10 AM

## 2022-04-07 ENCOUNTER — Ambulatory Visit (INDEPENDENT_AMBULATORY_CARE_PROVIDER_SITE_OTHER): Payer: PPO

## 2022-04-07 ENCOUNTER — Telehealth: Payer: PPO

## 2022-04-07 ENCOUNTER — Telehealth: Payer: Self-pay

## 2022-04-07 DIAGNOSIS — G8929 Other chronic pain: Secondary | ICD-10-CM

## 2022-04-07 DIAGNOSIS — D649 Anemia, unspecified: Secondary | ICD-10-CM

## 2022-04-07 DIAGNOSIS — D696 Thrombocytopenia, unspecified: Secondary | ICD-10-CM

## 2022-04-07 DIAGNOSIS — F419 Anxiety disorder, unspecified: Secondary | ICD-10-CM

## 2022-04-07 DIAGNOSIS — I10 Essential (primary) hypertension: Secondary | ICD-10-CM

## 2022-04-07 DIAGNOSIS — E7800 Pure hypercholesterolemia, unspecified: Secondary | ICD-10-CM

## 2022-04-07 DIAGNOSIS — E039 Hypothyroidism, unspecified: Secondary | ICD-10-CM

## 2022-04-07 DIAGNOSIS — E782 Mixed hyperlipidemia: Secondary | ICD-10-CM

## 2022-04-07 DIAGNOSIS — Z9181 History of falling: Secondary | ICD-10-CM

## 2022-04-07 DIAGNOSIS — R4189 Other symptoms and signs involving cognitive functions and awareness: Secondary | ICD-10-CM

## 2022-04-07 NOTE — Chronic Care Management (AMB) (Signed)
Chronic Care Management   CCM RN Visit Note  04/07/2022 Name: [name] MRN: [id number] DOB: 10/24/1940  Subjective: [name] is a 81 y.o. year old female who is a primary care patient of [name], NP. The care management team was consulted for assistance with disease management and care coordination needs.    Engaged with patient by telephone for follow up visit in response to provider referral for case management and/or care coordination services.   Consent to Services:  The patient was given information about Chronic Care Management services, agreed to services, and gave verbal consent prior to initiation of services.  Please see initial visit note for detailed documentation.   Patient agreed to services and verbal consent obtained.   Assessment: Review of patient past medical history, allergies, medications, health status, including review of consultants reports, laboratory and other test data, was performed as part of comprehensive evaluation and provision of chronic care management services.   SDOH (Social Determinants of Health) assessments and interventions performed:    CCM Care Plan  Allergies  Allergen Reactions   Codeine Nausea Only    Tolerates oxycodone   Morphine Nausea And Vomiting   Ace Inhibitors Cough   Adhesive [Tape] Other (See Comments) and Rash    whelps    Outpatient Encounter Medications as of 04/07/2022  Medication Sig   acetaminophen (TYLENOL) 500 MG tablet Take 500 mg by mouth every 6 (six) hours as needed for moderate pain.    amLODipine (NORVASC) 10 MG tablet Take 1 tablet (10 mg total) by mouth daily.   atorvastatin (LIPITOR) 20 MG tablet Take 1 tablet (20 mg total) by mouth daily.   cetirizine (ZYRTEC) 10 MG tablet TAKE 1 TABLET(10 MG) BY MOUTH DAILY (Patient not taking: Reported on 04/02/2022)   citalopram (CELEXA) 20 MG tablet TAKE 1 TABLET(20 MG) BY MOUTH DAILY   levothyroxine (SYNTHROID) 150 MCG tablet TAKE 1 TABLET(150  MCG) BY MOUTH DAILY   losartan (COZAAR) 100 MG tablet TAKE 1 TABLET(100 MG) BY MOUTH DAILY   metoprolol succinate (TOPROL-XL) 50 MG 24 hr tablet Take with or immediately following a meal.   Multiple Vitamin (MULTIVITAMIN) capsule Take 1 capsule by mouth daily.   predniSONE (DELTASONE) 10 MG tablet Take 6 tabs with breakfast Day 1, 5 tabs Day 2, 4 tabs Day 3, 3 tabs Day 4, 2 tabs Day 5, 1 tab Day 6.   rOPINIRole (REQUIP) 0.5 MG tablet TAKE 1 TABLET(0.5 MG) BY MOUTH AT BEDTIME   tizanidine (ZANAFLEX) 2 MG capsule Take 1 capsule (2 mg total) by mouth at bedtime as needed for muscle spasms. (Patient not taking: Reported on 04/02/2022)   traZODone (DESYREL) 50 MG tablet TAKE 1 TABLET(50 MG) BY MOUTH AT BEDTIME AS NEEDED FOR SLEEP   No facility-administered encounter medications on file as of 04/07/2022.    Patient Active Problem List   Diagnosis Date Noted   Thrombocytopenia ([place]) 06/10/2021   Cognitive impairment 06/10/2021   Aortic atherosclerosis ([place]) 05/08/2021   Class 1 obesity due to excess calories with body mass index (BMI) of 31.0 to 31.9 in adult 03/18/2021   RLS (restless legs syndrome) 01/29/2021   Obstructive chronic bronchitis without exacerbation ([place]) 01/29/2021   Hyperlipidemia 07/09/2020   Osteopenia of left femoral neck 06/09/2019   Hyperparathyroidism ([place]) 04/15/2019   Acquired hypothyroidism 08/05/2018   Anxiety 08/04/2018   Psychophysiological insomnia 08/04/2018   Osteoarthritis 04/14/2018   Hypertension 11/23/2017    Conditions to be addressed/monitored:HTN, HLD, Anxiety, Hypothyroidism,  and thrombocytopenia, anemia, bleeding, falls, and right ear issues  Care Plan : RNCM: General Plan of Care (Adult)  Updates made by Vanita Ingles, RN since 04/07/2022 12:00 AM     Problem: RNCM; Adult Plan of Care for Chronic Disease Management and Care Coordination Needs   Priority: High  Onset Date: 06/24/2021     Long-Range Goal: RNCM: Adult Plan of Care for Chronic  Disease Management and Care Coordination Needs   Start Date: 06/24/2021  Expected End Date: 06/19/2022  Recent Progress: On track  Priority: High  Note:   Current Barriers:  Knowledge Deficits related to plan of care for management of HTN, HLD, Anxiety with Excessive Worry, Social Anxiety, and thrombocytopenia and hypothyroidism  Chronic Disease Management support and education needs related to HTN, HLD, Anxiety with Excessive Worry, Social Anxiety, and thrombocytopenia and hypothyroidism  Cognitive Deficits Falls- per husband she had a fall in the bathroom last week (today's date is 04-07-2022)  RNCM Clinical Goal(s):  Patient will verbalize understanding of plan for management of HTN, HLD, Anxiety, and thrombocytopenia and hypothyroidism  verbalize basic understanding of HTN, HLD, Anxiety, and hypothyroidism and thrombocytopenia  disease process and self health management plan  take all medications exactly as prescribed and will call provider for medication related questions demonstrate understanding of rationale for each prescribed medication  attend all scheduled medical appointments with pcp and specialist on a regular basis demonstrate improved and ongoing adherence to prescribed treatment plan for HTN, HLD, Anxiety, and thrombocytopenia and hypothyroidism  as evidenced by adherence to prescribed medication regimen contacting provider for new or worsened symptoms or questions  demonstrate improved and ongoing health management independence by taking medications as prescribed, calling the office for changes in conditions, questions, or new concerns continue to work with RN Care Manager to address care management and care coordination needs related to HTN, HLD, Anxiety, and thrombocytopenia, and hypothyroidism   work with pharmacist to address Cognitive Deficits and medication reconciliation and ongoing support related to HTN, HLD, Anxiety, and thrombocytopenia, and  hyporthyroidism demonstrate a decrease in HTN, HLD, Anxiety, and hypothyroidism and thrombocytopenia exacerbations  demonstrate ongoing self health care management ability  through collaboration with RN Care manager, provider, and care team.   Interventions: 1:1 collaboration with primary care provider regarding development and update of comprehensive plan of care as evidenced by provider attestation and co-signature Inter-disciplinary care team collaboration (see longitudinal plan of care) Evaluation of current treatment plan related to  self management and patient's adherence to plan as established by provider   SDOH Barriers (Status: Goal on Track (progressing): YES.)  Patient interviewed and SDOH assessment performed        SDOH Interventions    Flowsheet Row Most Recent Value  SDOH Interventions   Food Insecurity Interventions Intervention Not Indicated  Financial Strain Interventions Intervention Not Indicated  Intimate Partner Violence Interventions Intervention Not Indicated  Physical Activity Interventions Other (Comments)  [no structured activity]  Stress Interventions Intervention Not Indicated  Social Connections Interventions Other (Comment)  [good support system]  Transportation Interventions Intervention Not Indicated     Patient interviewed and appropriate assessments performed Provided patient with information about CCM team and working with the patient to optimize health and well being Discussed plans with patient for ongoing care management follow up and provided patient with direct contact information for care management team Advised patient to call the office for changes in conditions, questions, or concerns about health and well being Collaborated with RN Case Manager re:  working with the pharm D to assist with medications reconciliation and ongoing education and support of medications.     Anemia/Bleeding( Thrombocytopenia)  (Status: Goal on Track  (progressing): YES.) Assessment of understanding of anemia/bleeding disorder diagnosis. 10-07-2021: Long standing anemia with thrombocytopenia. Face to face visit with the patient today with extensive education on risk of bleeding and protecting herself. The patient denies nose bleeds, blood in urine or stool. Does have sporadic bruising on bilateral arms. Education provided that this is a normal finding with patients with thrombocytopenia. Husband with the patient and reviewed risk and precautions with the husband as well. The patient ask good questions and just wanted reassurance. 12-09-2021: The patient states she stays cold "all the time". Education provided and reassurance given. The patient states she is supposed to see oncology next month for further evaluation and treatment options. She states that she just does not like the way she feels sometimes. She is optimistic and was in her sunroom doing her puzzles at the time of the call. She denies any acute findings at this time. Will continue to monitor. 02-03-2022: The patient states that she has noticed more "red spots" on her arms and legs and she is sure it is from her thrombocytopenia. The patient states that she is being careful. Review of safety measures and healthy eating. The patient states she sees the specialist this week for follow up and recommendations. 04-07-2022: The patient states she doesn't feel the best. She denies any acute findings but says her head feels funny on the right side and it is likely because of her ear hurting. She also had a fall last week. Education and support given.  Basic overview and discussion of anemia/bleeding disorder or acute disease state. 02-03-2022: Review with educational information provided. The patient states that she has more and more red spots. Review of not using a razor to shave her arms or legs, monitoring for safety and fall risk, eating healthy and monitoring her condition on a routine basis.  Review of  most recent labs:  Lab Results  Component Value Date   WBC 8.5 02/11/2022   HGB 15.4 (H) 02/11/2022   HCT 45.1 02/11/2022   MCV 98.0 02/11/2022   PLT 139 (L) 02/11/2022   Medications reviewed. 07-29-2021: Face to face visit with the patient review of medications. She is compliant with medications. Ask for assistance with getting a refill for her amlodipine 5 mg that does not have refills. Will collaborate with the pcp and pharm D. 10-07-2021: Review with the patient and submitted refill request for expressed needs. Collaboration between the pcp and CCM team. 04-07-2022: The patient endorses compliance with medications.  Continue to work with the pharm D for effective management of her medications.  - Counseled on bleeding risk associated with thrombocytopenia and importance of self-monitoring for signs/symptoms of bleeding. 07-29-2021: printed off material on thrombocytopenia and reviewed with the patient. The patient is having a hard time understanding what it means to have low platelets. Extensive education. The patient is having blood work today to be evaluated before her upcoming physical. 02-03-2022: Review and recommendations given. Will continue to monitor for gaps in knowledge on chronic disease processes and changes.  - Counseled on avoidance of NSAIDs due to increased bleeding risk, extensive education on avoiding NSAIDS. The patient ask if taking aspirin and ibuprofen were okay to take. The patient advised to avoid aspirin, ibuprofen, advil, aleve, naproxen and other medications classified as NSAIDS. Advised the patient if she had to take  something for a headache to use Tylenol and not advil or ibuprofen. Used teach back method to enforce the need to not use aspirin or NSAIDS. Will also consult the pharm D for assistance with education on avoiding ASA and NSAIDS. 10-07-2021: Reviewed with the patient not to take NSAIDS as this puts her at higher risk of bleeding. The patient states she only takes  Tylenol when she needs it. Extensive review with the patient and her husband on avoiding Aleve, Aspirin, ADVIL and other NSAID products. Questions answers and visual provided. 02-03-2022: Review and educational support provided. The patient does well with redirection and continued reminders of how to effectively manage her care.  - Counseled on importance of regular laboratory monitoring as directed by provider.  07-29-2021: Is having lab work today in the office . 02-03-2022: Has labwork on a regular basis; - Provided education about signs and symptoms of active bleeding such as stomach discomfort, coughing up blood or blood tinged secretions, bleeding from the gums/teeth, nosebleeds, increased bruising, blood in the urine/stool and/or if a traumatic injury occurs, regardless of severity of injury. 02-03-2022 Reviewed with the patient to call the office for changes in condition or noting any bleeding in urine, stool or more bruising than usual. The patient is aware of safety risk associated with thrombocytopenia ;  - Advised to call provider or 911 if active bleeding or signs and symptoms of active bleeding occur; - recommended promotion of rest and energy-conserving measures to manage fatigue, such as balancing activity with periods of rest - encouraged strategies to prevent falls related to fatigue, weakness and dizziness; encouraged sitting before standing and using an assistive device. 02-03-2022: Denies falls, is having some dizziness and light-headedness at times when changing position.  Review of changing position slowly to prevent orthostatic hypotension. Education and support given for safety and falls prevention - encouraged optimal oral intake to support fluid balance and nutrition - encouraged dietary changes to increase dietary intake of iron, Vitamin L37 and folic acid as advised/prescribed Education provided from pcp notes that the pcp feels thrombocytopenia may be coming from medications she had  been taken. Reviewed medication changes with the patient and she verbalized understanding. 10-07-2021: Extensive review with the patient and the patients husband at face to face visit today. 02-03-2022: Review of disease process of thrombocytopenia and to keep appointments with specialist and pcp and call the office for questions or concerns. The patient states that she sees her specialist this week for follow up and review.   Anxiety   (Status: Goal on Track (progressing): YES.) Evaluation of current treatment plan related to Anxiety,  self-management and patient's adherence to plan as established by provider. 07-29-2021: The patient feels that she is stable with her anxiety. She denies any acute exacerbations. Recent friend of the family passed away and she reflected on the services. Empathetic listening and support given. 10-07-2021: No acute findings related to anxiety today. The patient is calm and able to recall events and denies any acute distress. Has good support system from her husband who accompanied her today. 12-09-2021: The patient was doing well today. She states she gets concerned about how she feels sometimes but she is not "giving up". The patient states that she was enjoying the sunroom and working on her puzzles. She denies any acute anxiety today. She feels like she is sleeping well and eating well. She is happy there were no acute findings related to the neck mass she had. She says it is a little  sore where they were mashing on her neck. 02-03-2022: The patient is doing well and denies any acute findings related to her anxiety. She is taking care of herself the best that she can and denies any new concerns. Is thankful for the support of the CCM team and states she is thankful for each day she is give. Will continue to monitor for changes or new needs. 04-07-2022: The patient states that she is doing okay that she just doesn't feel the best because of her ear feeling stopped up. She is going to see  a specialist soon. She states she will be alright. Discussed plans with patient for ongoing care management follow up and provided patient with direct contact information for care management team Advised patient to call the office for changes in mood, anxiety, or depression ; Provided education to patient re: pacing activity, being safe in her environement, calling for changes, and being socially active; Reviewed medications with patient and discussed compliance.  02-03-2022: the patient states that she does not like to take medications for anxiety but has them if she needs them.  She uses diversional activities when she is anxious; Reviewed scheduled/upcoming provider appointments including saw pcp last week, will see ENT soon.  Discussed plans with patient for ongoing care management follow up and provided patient with direct contact information for care management team; Screening for signs and symptoms of depression related to chronic disease state;  Assessed social determinant of health barriers;   Hyperlipidemia:  (Status: Goal on Track (progressing): YES.) Lab Results  Component Value Date   CHOL 207 (H) 02/19/2022   HDL 48 (L) 02/19/2022   LDLCALC 127 (H) 02/19/2022   TRIG 208 (H) 02/19/2022   CHOLHDL 4.3 02/19/2022     Medication review performed; medication list updated in electronic medical record. 06-24-2021: the patient had a change from simvastatin to atorvastatin 10 mg daily. The pcp feels simvastatin may be contributing to thrombocytopenia issue. Extensive review of medication changes with the patient for clarity and compliance. Also education on taking cholesterol medications with night time for it to be more beneficial for the patient. 10-07-2021: The patient is compliant with medications. Had medications bottles with her at visit. 04-07-2022: Endorses taking Atorvastatin 10 mg daily. The patient works on a regular basis with the pharm D.  Provider established cholesterol goals  reviewed 10-07-2021: Reviewed with the patient at face to face visit . 04-07-2022: Reviewed with the patient the goals of cholesterol levels Counseled on importance of regular laboratory monitoring as prescribed.  07-29-2021: The patient had new lab work today after visit with [place]. 04-07-2022: Has regular lab work. Denies any issues with getting labwork.  Provided HLD educational materials. 7-17-023: Education and support given. Questions answered Reviewed role and benefits of statin for ASCVD risk reduction; Discussed strategies to manage statin-induced myalgias; Reviewed importance of limiting foods high in cholesterol 02-03-2022: Is eating well: The patient states she is compliant with a heart healthy/ADA diet. Review of foods that are good to eat for her in the effective management of her chronic conditions.  Reviewed exercise goals and target of 150 minutes per week. 06-24-2021: Does not do any structured exercise. Education and support given.   Hypertension: (Status: Goal on Track (progressing): YES.) Last practice recorded BP readings:  BP Readings from Last 3 Encounters:  04/02/22 116/63  02/19/22 118/70  02/11/22 (!) 148/71  Trending to normal. Denies any headaches Most recent eGFR/CrCl:  Lab Results  Component Value Date   EGFR 69  07/29/2021    No components found for: CRCL  Evaluation of current treatment plan related to hypertension self management and patient's adherence to plan as established by provider.  10-07-2021: The patient denies any concerns with her heart health. Review of heart healthy/ADA diet and the patient states she eats good and stays hydrated. Showed the patient how to check on the back of her hand for sx and sx of dehydration.  The patient states she feels fine. Sometimes she may have some dizziness. Discussed safety and orthostatic hypotension. The patient denies any headaches or other concerns. Will continue to monitor. 12-09-2021: The patient has had a couple visits  to the pcp recently with fluctuations of blood pressures. Also complains at times with headaches. The patient states that she is not having as many headaches or lightheadedness. Education provided on orthostatic hypotension and changing position slowly to prevent safety risk and falls. The patient also educated on headaches possibly being elevation in blood pressure. Reassurance provided. The patient denies any acute findings today. Will continue to monitor for changes. 02-03-2022: The patient states that she has had a couple of readings of her blood pressures that are a little high but denies any issues at this time. The patient has upcoming visit with the pcp. Will continue to monitor. 04-07-2022: The patient with more stable blood pressure readings. The patient denies any acute findings today.  Provided education to patient re: stroke prevention, s/s of heart attack and stroke; Reviewed prescribed diet heart healthy/ADA diet  Reviewed medications with patient and discussed importance of compliance.  07-29-2021: The patient states she needs help with refills for amlodipine 5 mg. Will send an inbasket message to the pcp and pharm D for assistance with refills for amlodipine.5 mg. The patient has a little over a week worth of medication supply. 04-07-2022: The patient is compliant with medications. Has medications. Calls for refills when needed.  Discussed plans with patient for ongoing care management follow up and provided patient with direct contact information for care management team; Advised patient, providing education and rationale, to monitor blood pressure daily and record, calling PCP for findings outside established parameters;  Reviewed scheduled/upcoming provider appointments including: Saw pcp on 04-02-2022. Knows to call for changes or needs Provided education on prescribed diet heart healthy/ADA diet. 04-07-2022: Review and education provided ;  Discussed complications of poorly controlled blood  pressure such as heart disease, stroke, circulatory complications, vision complications, kidney impairment, sexual dysfunction;    Hypothyroidism  (Status: Goal on Track (progressing): YES.) Evaluation of current treatment plan related to  hypothyroidism  ,  self-management and patient's adherence to plan as established by provider. 02-03-2022: The patient is compliant with plan of care for hypothyroidism. No new concerns today. States she stays tired all of the time but she is pacing her activity. Review of pacing activity. States she is getting adequate rest and sleep. 04-07-2022: The patient is still feeling tired and give out. She feels like the issue with her right ear and right side of face is due to her thyroid. Seeing a specialist soon.  Discussed plans with patient for ongoing care management follow up and provided patient with direct contact information for care management team Advised patient to call the office for changes in conditions, new concerns, or questions; Provided education to patient re: taking levothyroixine as prescribed. The patient though this was one of the medicaitons she was told to stop. The patient verbalized understanding of education provided and not to stop medication; Reviewed  medications with patient and discussed compliance. The patient states she is compliant with medications. Able to use teach back method with RNCM and verbalized understanding of taking levothyroixine as prescribed. 02-03-2022: The patient is compliant with medications. The patient denies any new concerns related to her thyroid medications. ; Collaborated with pharm D regarding medication reconciliation and ongoing support and education, spoke to pharm D today asking the pharm D to reach out to the patient for support and education in medication management; Pharmacy referral for medication reconciliation and ongoing support and education; Discussed plans with patient for ongoing care management follow  up and provided patient with direct contact information for care management team;    Falls:  (Status: New goal.) Long Term Goal  Provided written and verbal education re: potential causes of falls and Fall prevention strategies Reviewed medications and discussed potential side effects of medications such as dizziness and frequent urination Advised patient of importance of notifying provider of falls Assessed for signs and symptoms of orthostatic hypotension Assessed for falls since last encounter. 04-07-2022: Per the patients husband the patient fell last week in the bathroom and he had to go in his wheelchair and get her up.  Assessed patients knowledge of fall risk prevention secondary to previously provided education Advised patient to discuss new falls and safety concerns  with provider Screening for signs and symptoms of depression related to chronic disease state Assessed social determinant of health barriers    Right ear issues  (Status: New goal.) Short Term Goal  Evaluation of current treatment plan related to  right ear issues ,  self-management and patient's adherence to plan as established by provider. Discussed plans with patient for ongoing care management follow up and provided patient with direct contact information for care management team Advised patient to call the office for changes or worsening condition. The patient states she is going to see the specialist soon to be evaluated. ; Provided education to patient re: monitoring for drainage or other issues related to her right ear. Also discussed safety and being careful not to injury herself. ; Reviewed scheduled/upcoming provider appointments including specialist upcoming. She could not remember the exact date. ; Discussed plans with patient for ongoing care management follow up and provided patient with direct contact information for care management team; Advised patient to discuss questions, changes, or concerns about her  ear  with provider;   Patient Goals/Self-Care Activities: Patient will self administer medications as prescribed as evidenced by self report/primary caregiver report  Patient will attend all scheduled provider appointments as evidenced by clinician review of documented attendance to scheduled appointments and patient/caregiver report Patient will call pharmacy for medication refills as evidenced by patient report and review of pharmacy fill history as appropriate Patient will attend church or other social activities as evidenced by patient report Patient will continue to perform ADL's independently as evidenced by patient/caregiver report Patient will continue to perform IADL's independently as evidenced by patient/caregiver report Patient will call provider office for new concerns or questions as evidenced by review of documented incoming telephone call notes and patient report Patient will work with BSW to address care coordination needs and will continue to work with the clinical team to address health care and disease management related needs as evidenced by documented adherence to scheduled care management/care coordination appointments       Plan:Telephone follow up appointment with care management team member scheduled for:  06-09-2022 at 130 pm  [place], MSN, [place] Coordinator  [facility] Mobile: [phone]

## 2022-04-07 NOTE — Telephone Encounter (Signed)
  Care Management   Follow Up Note   04/07/2022 Name: [name] MRN: [id number] DOB: [birth year]   Referred by: [name], NP Reason for referral : Chronic Care Management (RNCM: Follow up for Chronic Disease Management and Care Coordination Needs)   Called the patient back and was able to complete the call. See new encounter for details.   Follow Up Plan: Telephone follow up appointment with care management team member scheduled for: 06-09-2022 at 130 pm  Noreene Larsson RN, MSN, [place] Mobile: [phone]

## 2022-04-07 NOTE — Patient Instructions (Signed)
Visit Information  Thank you for taking time to visit with me today. Please don't hesitate to contact me if I can be of assistance to you before our next scheduled telephone appointment.  Following are the goals we discussed today:  Anemia/Bleeding( Thrombocytopenia)  (Status: Goal on Track (progressing): YES.) Assessment of understanding of anemia/bleeding disorder diagnosis. 10-07-2021: Long standing anemia with thrombocytopenia. Face to face visit with the patient today with extensive education on risk of bleeding and protecting herself. The patient denies nose bleeds, blood in urine or stool. Does have sporadic bruising on bilateral arms. Education provided that this is a normal finding with patients with thrombocytopenia. Husband with the patient and reviewed risk and precautions with the husband as well. The patient ask good questions and just wanted reassurance. 12-09-2021: The patient states she stays cold "all the time". Education provided and reassurance given. The patient states she is supposed to see oncology next month for further evaluation and treatment options. She states that she just does not like the way she feels sometimes. She is optimistic and was in her sunroom doing her puzzles at the time of the call. She denies any acute findings at this time. Will continue to monitor. 02-03-2022: The patient states that she has noticed more "red spots" on her arms and legs and she is sure it is from her thrombocytopenia. The patient states that she is being careful. Review of safety measures and healthy eating. The patient states she sees the specialist this week for follow up and recommendations. 04-07-2022: The patient states she doesn't feel the best. She denies any acute findings but says her head feels funny on the right side and it is likely because of her ear hurting. She also had a fall last week. Education and support given.  Basic overview and discussion of anemia/bleeding disorder or acute  disease state. 02-03-2022: Review with educational information provided. The patient states that she has more and more red spots. Review of not using a razor to shave her arms or legs, monitoring for safety and fall risk, eating healthy and monitoring her condition on a routine basis.  Review of most recent labs:       Lab Results  Component Value Date    WBC 8.5 02/11/2022    HGB 15.4 (H) 02/11/2022    HCT 45.1 02/11/2022    MCV 98.0 02/11/2022    PLT 139 (L) 02/11/2022    Medications reviewed. 07-29-2021: Face to face visit with the patient review of medications. She is compliant with medications. Ask for assistance with getting a refill for her amlodipine 5 mg that does not have refills. Will collaborate with the pcp and pharm D. 10-07-2021: Review with the patient and submitted refill request for expressed needs. Collaboration between the pcp and CCM team. 04-07-2022: The patient endorses compliance with medications.  Continue to work with the pharm D for effective management of her medications.  - Counseled on bleeding risk associated with thrombocytopenia and importance of self-monitoring for signs/symptoms of bleeding. 07-29-2021: printed off material on thrombocytopenia and reviewed with the patient. The patient is having a hard time understanding what it means to have low platelets. Extensive education. The patient is having blood work today to be evaluated before her upcoming physical. 02-03-2022: Review and recommendations given. Will continue to monitor for gaps in knowledge on chronic disease processes and changes.  - Counseled on avoidance of NSAIDs due to increased bleeding risk, extensive education on avoiding NSAIDS. The patient ask if taking  aspirin and ibuprofen were okay to take. The patient advised to avoid aspirin, ibuprofen, advil, aleve, naproxen and other medications classified as NSAIDS. Advised the patient if she had to take something for a headache to use Tylenol and not advil or  ibuprofen. Used teach back method to enforce the need to not use aspirin or NSAIDS. Will also consult the pharm D for assistance with education on avoiding ASA and NSAIDS. 10-07-2021: Reviewed with the patient not to take NSAIDS as this puts her at higher risk of bleeding. The patient states she only takes Tylenol when she needs it. Extensive review with the patient and her husband on avoiding Aleve, Aspirin, ADVIL and other NSAID products. Questions answers and visual provided. 02-03-2022: Review and educational support provided. The patient does well with redirection and continued reminders of how to effectively manage her care.  - Counseled on importance of regular laboratory monitoring as directed by provider.  07-29-2021: Is having lab work today in the office . 02-03-2022: Has labwork on a regular basis; - Provided education about signs and symptoms of active bleeding such as stomach discomfort, coughing up blood or blood tinged secretions, bleeding from the gums/teeth, nosebleeds, increased bruising, blood in the urine/stool and/or if a traumatic injury occurs, regardless of severity of injury. 02-03-2022 Reviewed with the patient to call the office for changes in condition or noting any bleeding in urine, stool or more bruising than usual. The patient is aware of safety risk associated with thrombocytopenia ;  - Advised to call provider or 911 if active bleeding or signs and symptoms of active bleeding occur; - recommended promotion of rest and energy-conserving measures to manage fatigue, such as balancing activity with periods of rest - encouraged strategies to prevent falls related to fatigue, weakness and dizziness; encouraged sitting before standing and using an assistive device. 02-03-2022: Denies falls, is having some dizziness and light-headedness at times when changing position.  Review of changing position slowly to prevent orthostatic hypotension. Education and support given for safety and falls  prevention - encouraged optimal oral intake to support fluid balance and nutrition - encouraged dietary changes to increase dietary intake of iron, Vitamin B34 and folic acid as advised/prescribed Education provided from pcp notes that the pcp feels thrombocytopenia may be coming from medications she had been taken. Reviewed medication changes with the patient and she verbalized understanding. 10-07-2021: Extensive review with the patient and the patients husband at face to face visit today. 02-03-2022: Review of disease process of thrombocytopenia and to keep appointments with specialist and pcp and call the office for questions or concerns. The patient states that she sees her specialist this week for follow up and review.    Anxiety   (Status: Goal on Track (progressing): YES.) Evaluation of current treatment plan related to Anxiety,  self-management and patient's adherence to plan as established by provider. 07-29-2021: The patient feels that she is stable with her anxiety. She denies any acute exacerbations. Recent friend of the family passed away and she reflected on the services. Empathetic listening and support given. 10-07-2021: No acute findings related to anxiety today. The patient is calm and able to recall events and denies any acute distress. Has good support system from her husband who accompanied her today. 12-09-2021: The patient was doing well today. She states she gets concerned about how she feels sometimes but she is not "giving up". The patient states that she was enjoying the sunroom and working on her puzzles. She denies any acute anxiety  today. She feels like she is sleeping well and eating well. She is happy there were no acute findings related to the neck mass she had. She says it is a little sore where they were mashing on her neck. 02-03-2022: The patient is doing well and denies any acute findings related to her anxiety. She is taking care of herself the best that she can and denies any  new concerns. Is thankful for the support of the CCM team and states she is thankful for each day she is give. Will continue to monitor for changes or new needs. 04-07-2022: The patient states that she is doing okay that she just doesn't feel the best because of her ear feeling stopped up. She is going to see a specialist soon. She states she will be alright. Discussed plans with patient for ongoing care management follow up and provided patient with direct contact information for care management team Advised patient to call the office for changes in mood, anxiety, or depression ; Provided education to patient re: pacing activity, being safe in her environement, calling for changes, and being socially active; Reviewed medications with patient and discussed compliance.  02-03-2022: the patient states that she does not like to take medications for anxiety but has them if she needs them.  She uses diversional activities when she is anxious; Reviewed scheduled/upcoming provider appointments including saw pcp last week, will see ENT soon.  Discussed plans with patient for ongoing care management follow up and provided patient with direct contact information for care management team; Screening for signs and symptoms of depression related to chronic disease state;  Assessed social determinant of health barriers;    Hyperlipidemia:  (Status: Goal on Track (progressing): YES.)      Lab Results  Component Value Date    CHOL 207 (H) 02/19/2022    HDL 48 (L) 02/19/2022    LDLCALC 127 (H) 02/19/2022    TRIG 208 (H) 02/19/2022    CHOLHDL 4.3 02/19/2022      Medication review performed; medication list updated in electronic medical record. 06-24-2021: the patient had a change from simvastatin to atorvastatin 10 mg daily. The pcp feels simvastatin may be contributing to thrombocytopenia issue. Extensive review of medication changes with the patient for clarity and compliance. Also education on taking cholesterol  medications with night time for it to be more beneficial for the patient. 10-07-2021: The patient is compliant with medications. Had medications bottles with her at visit. 04-07-2022: Endorses taking Atorvastatin 10 mg daily. The patient works on a regular basis with the pharm D.  Provider established cholesterol goals reviewed 10-07-2021: Reviewed with the patient at face to face visit . 04-07-2022: Reviewed with the patient the goals of cholesterol levels Counseled on importance of regular laboratory monitoring as prescribed.  07-29-2021: The patient had new lab work today after visit with [place]. 04-07-2022: Has regular lab work. Denies any issues with getting labwork.  Provided HLD educational materials. 7-17-023: Education and support given. Questions answered Reviewed role and benefits of statin for ASCVD risk reduction; Discussed strategies to manage statin-induced myalgias; Reviewed importance of limiting foods high in cholesterol 02-03-2022: Is eating well: The patient states she is compliant with a heart healthy/ADA diet. Review of foods that are good to eat for her in the effective management of her chronic conditions.  Reviewed exercise goals and target of 150 minutes per week. 06-24-2021: Does not do any structured exercise. Education and support given.    Hypertension: (Status: Goal on Track (  progressing): YES.) Last practice recorded BP readings:     BP Readings from Last 3 Encounters:  04/02/22 116/63  02/19/22 118/70  02/11/22 (!) 148/71  Trending to normal. Denies any headaches Most recent eGFR/CrCl:       Lab Results  Component Value Date    EGFR 69 07/29/2021    No components found for: CRCL   Evaluation of current treatment plan related to hypertension self management and patient's adherence to plan as established by provider.  10-07-2021: The patient denies any concerns with her heart health. Review of heart healthy/ADA diet and the patient states she eats good and stays hydrated.  Showed the patient how to check on the back of her hand for sx and sx of dehydration.  The patient states she feels fine. Sometimes she may have some dizziness. Discussed safety and orthostatic hypotension. The patient denies any headaches or other concerns. Will continue to monitor. 12-09-2021: The patient has had a couple visits to the pcp recently with fluctuations of blood pressures. Also complains at times with headaches. The patient states that she is not having as many headaches or lightheadedness. Education provided on orthostatic hypotension and changing position slowly to prevent safety risk and falls. The patient also educated on headaches possibly being elevation in blood pressure. Reassurance provided. The patient denies any acute findings today. Will continue to monitor for changes. 02-03-2022: The patient states that she has had a couple of readings of her blood pressures that are a little high but denies any issues at this time. The patient has upcoming visit with the pcp. Will continue to monitor. 04-07-2022: The patient with more stable blood pressure readings. The patient denies any acute findings today.  Provided education to patient re: stroke prevention, s/s of heart attack and stroke; Reviewed prescribed diet heart healthy/ADA diet  Reviewed medications with patient and discussed importance of compliance.  07-29-2021: The patient states she needs help with refills for amlodipine 5 mg. Will send an inbasket message to the pcp and pharm D for assistance with refills for amlodipine.5 mg. The patient has a little over a week worth of medication supply. 04-07-2022: The patient is compliant with medications. Has medications. Calls for refills when needed.  Discussed plans with patient for ongoing care management follow up and provided patient with direct contact information for care management team; Advised patient, providing education and rationale, to monitor blood pressure daily and record,  calling PCP for findings outside established parameters;  Reviewed scheduled/upcoming provider appointments including: Saw pcp on 04-02-2022. Knows to call for changes or needs Provided education on prescribed diet heart healthy/ADA diet. 04-07-2022: Review and education provided ;  Discussed complications of poorly controlled blood pressure such as heart disease, stroke, circulatory complications, vision complications, kidney impairment, sexual dysfunction;      Hypothyroidism  (Status: Goal on Track (progressing): YES.) Evaluation of current treatment plan related to  hypothyroidism  ,  self-management and patient's adherence to plan as established by provider. 02-03-2022: The patient is compliant with plan of care for hypothyroidism. No new concerns today. States she stays tired all of the time but she is pacing her activity. Review of pacing activity. States she is getting adequate rest and sleep. 04-07-2022: The patient is still feeling tired and give out. She feels like the issue with her right ear and right side of face is due to her thyroid. Seeing a specialist soon.  Discussed plans with patient for ongoing care management follow up and provided patient with  direct contact information for care management team Advised patient to call the office for changes in conditions, new concerns, or questions; Provided education to patient re: taking levothyroixine as prescribed. The patient though this was one of the medicaitons she was told to stop. The patient verbalized understanding of education provided and not to stop medication; Reviewed medications with patient and discussed compliance. The patient states she is compliant with medications. Able to use teach back method with RNCM and verbalized understanding of taking levothyroixine as prescribed. 02-03-2022: The patient is compliant with medications. The patient denies any new concerns related to her thyroid medications. ; Collaborated with pharm D  regarding medication reconciliation and ongoing support and education, spoke to pharm D today asking the pharm D to reach out to the patient for support and education in medication management; Pharmacy referral for medication reconciliation and ongoing support and education; Discussed plans with patient for ongoing care management follow up and provided patient with direct contact information for care management team;      Falls:  (Status: New goal.) Long Term Goal  Provided written and verbal education re: potential causes of falls and Fall prevention strategies Reviewed medications and discussed potential side effects of medications such as dizziness and frequent urination Advised patient of importance of notifying provider of falls Assessed for signs and symptoms of orthostatic hypotension Assessed for falls since last encounter. 04-07-2022: Per the patients husband the patient fell last week in the bathroom and he had to go in his wheelchair and get her up.  Assessed patients knowledge of fall risk prevention secondary to previously provided education Advised patient to discuss new falls and safety concerns  with provider Screening for signs and symptoms of depression related to chronic disease state Assessed social determinant of health barriers      Right ear issues  (Status: New goal.) Short Term Goal  Evaluation of current treatment plan related to  right ear issues ,  self-management and patient's adherence to plan as established by provider. Discussed plans with patient for ongoing care management follow up and provided patient with direct contact information for care management team Advised patient to call the office for changes or worsening condition. The patient states she is going to see the specialist soon to be evaluated. ; Provided education to patient re: monitoring for drainage or other issues related to her right ear. Also discussed safety and being careful not to injury  herself. ; Reviewed scheduled/upcoming provider appointments including specialist upcoming. She could not remember the exact date. ; Discussed plans with patient for ongoing care management follow up and provided patient with direct contact information for care management team; Advised patient to discuss questions, changes, or concerns about her ear  with provider;    Patient Goals/Self-Care Activities: Patient will self administer medications as prescribed as evidenced by self report/primary caregiver report  Patient will attend all scheduled provider appointments as evidenced by clinician review of documented attendance to scheduled appointments and patient/caregiver report Patient will call pharmacy for medication refills as evidenced by patient report and review of pharmacy fill history as appropriate Patient will attend church or other social activities as evidenced by patient report Patient will continue to perform ADL's independently as evidenced by patient/caregiver report Patient will continue to perform IADL's independently as evidenced by patient/caregiver report Patient will call provider office for new concerns or questions as evidenced by review of documented incoming telephone call notes and patient report Patient will work with BSW to address care  coordination needs and will continue to work with the clinical team to address health care and disease management related needs as evidenced by documented adherence to scheduled care management/care coordination appointments       Our next appointment is by telephone on 06-09-2022 at 130 pm  Please call the care guide team at [phone] if you need to cancel or reschedule your appointment.   If you are experiencing a Mental Health or [place] or need someone to talk to, please call the Suicide and Crisis Lifeline: 988 call the [place] National Suicide Prevention Lifeline: (445)128-4348 or TTY: 909-146-9524 TTY  724-084-8016) to talk to a trained counselor call 1-800-273-TALK (toll free, 24 hour hotline)   Patient verbalizes understanding of instructions and care plan provided today and agrees to view in [place]. Active MyChart status and patient understanding of how to access instructions and care plan via MyChart confirmed with patient.     Noreene Larsson RN, MSN, [place] Mobile: [phone]

## 2022-04-21 DIAGNOSIS — E7800 Pure hypercholesterolemia, unspecified: Secondary | ICD-10-CM

## 2022-04-21 DIAGNOSIS — E782 Mixed hyperlipidemia: Secondary | ICD-10-CM | POA: Diagnosis not present

## 2022-04-21 DIAGNOSIS — I10 Essential (primary) hypertension: Secondary | ICD-10-CM | POA: Diagnosis not present

## 2022-04-21 DIAGNOSIS — E039 Hypothyroidism, unspecified: Secondary | ICD-10-CM

## 2022-04-21 DIAGNOSIS — D649 Anemia, unspecified: Secondary | ICD-10-CM | POA: Diagnosis not present

## 2022-04-22 ENCOUNTER — Other Ambulatory Visit: Payer: Self-pay | Admitting: Internal Medicine

## 2022-04-22 DIAGNOSIS — F419 Anxiety disorder, unspecified: Secondary | ICD-10-CM

## 2022-04-22 DIAGNOSIS — J029 Acute pharyngitis, unspecified: Secondary | ICD-10-CM

## 2022-04-22 DIAGNOSIS — I10 Essential (primary) hypertension: Secondary | ICD-10-CM

## 2022-04-23 NOTE — Telephone Encounter (Signed)
Requested Prescriptions  Pending Prescriptions Disp Refills  . citalopram (CELEXA) 20 MG tablet [Pharmacy Med Name: CITALOPRAM '20MG'$  TABLETS] 90 tablet 0    Sig: TAKE 1 TABLET(20 MG) BY MOUTH DAILY     Psychiatry:  Antidepressants - SSRI Passed - 04/22/2022  1:52 PM      Passed - Valid encounter within last 6 months    Recent Outpatient Visits          3 weeks ago Chronic right ear pain   [place], DO   2 months ago Encounter for general adult medical examination with abnormal findings   [facility], Coralie Keens, NP   4 months ago Primary hypertension   [facility] [place], Coralie Keens, NP   4 months ago Mass of right side of neck   [facility] [place], Coralie Keens, NP   6 months ago Sore throat   [facility] [place], [place], NP             . metoprolol succinate (TOPROL-XL) 50 MG 24 hr tablet [Pharmacy Med Name: METOPROLOL ER SUCCINATE '50MG'$  TABS] 90 tablet 1    Sig: TAKE 1 TABLET BY MOUTH ONCE A DAY WITH OR IMMEDIATELY FOLLOWING A MEAL     Cardiovascular:  Beta Blockers Passed - 04/22/2022  1:52 PM      Passed - Last BP in normal range    BP Readings from Last 1 Encounters:  04/02/22 116/63         Passed - Last Heart Rate in normal range    Pulse Readings from Last 1 Encounters:  04/02/22 65         Passed - Valid encounter within last 6 months    Recent Outpatient Visits          3 weeks ago Chronic right ear pain   [facility] Olin Hauser, DO   2 months ago Encounter for general adult medical examination with abnormal findings   [facility] [place], Coralie Keens, NP   4 months ago Primary hypertension   [facility] [place], Coralie Keens, NP   4 months ago Mass of right side of neck   [facility], Coralie Keens, NP   6 months ago Sore throat   [facility] [place], [place], NP              . amLODipine (NORVASC) 10 MG tablet [Pharmacy Med Name: AMLODIPINE BESYLATE '10MG'$  TABLETS] 90 tablet 1    Sig: TAKE 1 TABLET(10 MG) BY MOUTH DAILY     Cardiovascular: Calcium Channel Blockers 2 Passed - 04/22/2022  1:52 PM      Passed - Last BP in normal range    BP Readings from Last 1 Encounters:  04/02/22 116/63         Passed - Last Heart Rate in normal range    Pulse Readings from Last 1 Encounters:  04/02/22 65         Passed - Valid encounter within last 6 months    Recent Outpatient Visits          3 weeks ago Chronic right ear pain   [place], DO   2 months ago Encounter for general adult medical examination with abnormal findings   [place], Coralie Keens, NP   4 months ago  Primary hypertension   [place], NP   4 months ago Mass of right side of neck   [facility] [place], Coralie Keens, NP   6 months ago Sore throat   [facility] [place], Coralie Keens, [place]

## 2022-05-12 ENCOUNTER — Ambulatory Visit: Payer: PPO | Admitting: Pharmacist

## 2022-05-12 ENCOUNTER — Telehealth: Payer: Self-pay | Admitting: Pharmacist

## 2022-05-12 DIAGNOSIS — I10 Essential (primary) hypertension: Secondary | ICD-10-CM

## 2022-05-12 DIAGNOSIS — E782 Mixed hyperlipidemia: Secondary | ICD-10-CM

## 2022-05-12 MED ORDER — LOSARTAN POTASSIUM 100 MG PO TABS: ORAL_TABLET | ORAL | 1 refills | Status: DC

## 2022-05-12 MED ORDER — LEVOTHYROXINE SODIUM 150 MCG PO TABS: ORAL_TABLET | ORAL | 1 refills | Status: DC

## 2022-05-12 NOTE — Telephone Encounter (Signed)
Patient requesting renewal of losartan and levothyroxine Rxs as current Rxs out of refills. Would you please send renewal Rxs to [place] for patient?  Thank you! Grayland Ormond

## 2022-05-12 NOTE — Telephone Encounter (Signed)
refilled 

## 2022-05-12 NOTE — Chronic Care Management (AMB) (Signed)
Chronic Care Management CCM Pharmacy Note  05/12/2022 Name:  [name] MRN:  [id number] DOB:  06-23-1941   Subjective: [name] is an 81 y.o. year old female who is a primary patient of [name], NP.  The CCM team was consulted for assistance with disease management and care coordination needs.    Engaged with patient by telephone for follow up visit for pharmacy case management and/or care coordination services.   Objective:  Medications Reviewed Today     Reviewed by Rennis Petty, RPH-CPP (Pharmacist) on 05/12/22 at 1339  Med List Status: <None>   Medication Order Taking? Sig Documenting Provider Last Dose Status Informant  acetaminophen (TYLENOL) 500 MG tablet [id number]  Take 500 mg by mouth every 6 (six) hours as needed for moderate pain.  [provider]  Active Self  amLODipine (NORVASC) 10 MG tablet [id number] Yes TAKE 1 TABLET(10 MG) BY MOUTH DAILY Baity, Coralie Keens, NP Taking Active   atorvastatin (LIPITOR) 20 MG tablet [id number]  Take 1 tablet (20 mg total) by mouth daily. [name], NP  Active   cetirizine (ZYRTEC) 10 MG tablet [id number]  TAKE 1 TABLET(10 MG) BY MOUTH DAILY  Patient not taking: Reported on 04/02/2022   [name], NP  Active   citalopram (CELEXA) 20 MG tablet [id number]  TAKE 1 TABLET(20 MG) BY MOUTH DAILY [name], NP  Active   levothyroxine (SYNTHROID) 150 MCG tablet [id number] Yes TAKE 1 TABLET(150 MCG) BY MOUTH DAILY [name], NP Taking Active   losartan (COZAAR) 100 MG tablet [id number] Yes TAKE 1 TABLET(100 MG) BY MOUTH DAILY [name], NP Taking Active   metoprolol succinate (TOPROL-XL) 50 MG 24 hr tablet [id number] Yes TAKE 1 TABLET BY MOUTH ONCE A DAY WITH OR IMMEDIATELY FOLLOWING A MEAL Baity, Coralie Keens, NP Taking Active   Multiple Vitamin (MULTIVITAMIN) capsule [id number]  Take 1 capsule by mouth daily. [provider]  Active   rOPINIRole (REQUIP) 0.5 MG tablet [id number]  TAKE  1 TABLET(0.5 MG) BY MOUTH AT BEDTIME [name], NP  Active   tizanidine (ZANAFLEX) 2 MG capsule [id number]  Take 1 capsule (2 mg total) by mouth at bedtime as needed for muscle spasms.  Patient not taking: Reported on 04/02/2022   [name], NP  Active   traZODone (DESYREL) 50 MG tablet [id number]  TAKE 1 TABLET(50 MG) BY MOUTH AT BEDTIME AS NEEDED FOR SLEEP [name], NP  Active             Pertinent Labs:  Lab Results  Component Value Date   HGBA1C 5.3 05/08/2021   Lab Results  Component Value Date   CHOL 207 (H) 02/19/2022   HDL 48 (L) 02/19/2022   LDLCALC 127 (H) 02/19/2022   TRIG 208 (H) 02/19/2022   CHOLHDL 4.3 02/19/2022   Lab Results  Component Value Date   CREATININE 0.84 02/11/2022   BUN 17 02/11/2022   NA 139 02/11/2022   K 3.6 02/11/2022   CL 106 02/11/2022   CO2 26 02/11/2022   BP Readings from Last 3 Encounters:  04/02/22 116/63  02/19/22 118/70  02/11/22 (!) 148/71   Pulse Readings from Last 3 Encounters:  04/02/22 65  02/19/22 60  02/11/22 61     SDOH:  (Social Determinants of Health) assessments and interventions performed:    [place]  Review of patient past medical history, allergies, medications, health status, including review of consultants  reports, laboratory and other test data, was performed as part of comprehensive evaluation and provision of chronic care management services.   Care Plan : PharmD - Medication Management; HTN  Updates made by Rennis Petty, RPH-CPP since 05/12/2022 12:00 AM     Problem: Disease Progression      Long-Range Goal: Disease Progression Prevented or Minimized   Start Date: 07/01/2021  Expected End Date: 09/29/2021  Recent Progress: On track  Priority: High  Note:   Current Barriers:  Unable to achieve control of hypertension   Pharmacist Clinical Goal(s):  patient will achieve adherence to monitoring guidelines and medication adherence to achieve therapeutic  efficacy achieve control of hypertension as evidenced by blood pressure readings  through collaboration with PharmD and provider.    Interventions: 1:1 collaboration with [name], NP regarding development and update of comprehensive plan of care as evidenced by provider attestation and co-signature Inter-disciplinary care team collaboration (see longitudinal plan of care) Perform chart review Patient seen for Office Visit with Dr. Parks Ranger on 7/12 related to chronic right ear pain. Provider advised patient 6-day course of prednisone Rx provided Referral placed to to ENT specialist Today patient reports appointment with ENT specialist scheduled for 8/24 Identify patient in need of refill of both losartan and levothyroxine Rxs and current Rx out of refills Send message to clinical team requesting renewal of Rxs  Hypertension:   Current treatment: Amlodipine 10 mg daily Metoprolol ER 50 mg QAM Losartan 100 mg daily Denies monitoring home blood pressure Denies hypertensive symptoms Have counseled patient on home BP monitoring technique Counsel patient once obtain new monitor, to start monitoring home BP, to keep a log of the results, have this record for [place] to review during telephone appointment, but to call office sooner or readings outside of established parameter or for new/worsening symptoms  Hyperlipidemia: Current treatment: atorvastatin 10 mg daily From review of chart, note per provider notes from latest lipid panel on 5/31, provider advised patient to increase atorvastatin dose to 20 mg daily and return for repeat lipid panel in 3 months. Today identify that patient has continued to take atorvastatin 10 mg daily, rather than increasing to 20 mg daily Collaborate with Marya Amsler at [place] to request pharmacy fill atorvastatin 20 mg daily Rx for patient (and discontinue previous Rx for atorvastatin 10 mg strength) Patient to start taking atorvastatin 20 mg daily  today Collaborate with PCP. Provider advises will repeat lipid panel when patient returns for follow up in December Advise patient to contact office to schedule 58-monthfollow up appointment with PCP Encourage patient to decrease saturated and trans fats in her diet  Tobacco Use: Have counseled patient on benefits of smoking cessation Reports has reduced smoking to ~10 cigarettes/day States not interested in quitting at this time  Patient Goals/Self-Care Activities patient will:  - take medications as prescribed - check blood pressure, document, and provide at future appointments  Follow Up Plan: The care management team will reach out to the patient again on 07/16/2022 at 1 pm       EWallace Cullens PharmD, [phone]

## 2022-05-12 NOTE — Patient Instructions (Signed)
Visit Information  Thank you for taking time to visit with me today. Please don't hesitate to contact me if I can be of assistance to you before our next scheduled telephone appointment.  Following are the goals we discussed today:   Goals Addressed             This Visit's Progress    Pharmacy Goals       Please obtain new upper arm blood pressure monitor and restart checking your home blood pressure  We recommend a blood pressure cuff that goes around your upper arm, as these are generally the most accurate.   To appropriately check your blood pressure, make sure you do the following:  1) Avoid caffeine, exercise, or tobacco products for 30 minutes before checking. 2) Sit with your back supported in a flat-backed chair. Rest your arm on something flat (arm of the chair, table, etc). 3) Sit still with your feet flat on the floor, resting, for at least 5 minutes.  4) Check your blood pressure. Take 1-2 readings.   Write down these readings and bring with you to any provider appointments. Bring your home blood pressure machine with you to a provider's office for accuracy comparison at least once a year. Make sure you take your blood pressure medications before you come to any office visit.   Feel free to call me with any questions or concerns. I look forward to our next call!  Wallace Cullens, PharmD, [place] [phone]         Our next appointment is by telephone on 07/16/2022 at 1 pm  Please call the care guide team at [phone] if you need to cancel or reschedule your appointment.    Patient verbalizes understanding of instructions and care plan provided today and agrees to view in [place]. Active MyChart status and patient understanding of how to access instructions and care plan via MyChart confirmed with patient.

## 2022-05-12 NOTE — Addendum Note (Signed)
Addended by: [name] on: 05/12/2022 02:09 PM   Modules accepted: Orders

## 2022-05-15 DIAGNOSIS — H903 Sensorineural hearing loss, bilateral: Secondary | ICD-10-CM | POA: Diagnosis not present

## 2022-05-28 ENCOUNTER — Other Ambulatory Visit: Payer: Self-pay | Admitting: Internal Medicine

## 2022-05-28 DIAGNOSIS — E782 Mixed hyperlipidemia: Secondary | ICD-10-CM

## 2022-05-29 ENCOUNTER — Other Ambulatory Visit: Payer: PPO

## 2022-05-29 LAB — LIPID PANEL
Cholesterol: 197 mg/dL (ref <200)
HDL: 46 mg/dL — ABNORMAL LOW (ref >=50)
LDL Cholesterol (Calc): 116 mg/dL (calc) — ABNORMAL HIGH
Non-HDL Cholesterol (Calc): 151 mg/dL (calc) — ABNORMAL HIGH (ref <130)
Total CHOL/HDL Ratio: 4.3 (calc) (ref <5.0)
Triglycerides: 248 mg/dL — ABNORMAL HIGH (ref <150)

## 2022-06-02 ENCOUNTER — Ambulatory Visit: Payer: Self-pay

## 2022-06-02 ENCOUNTER — Other Ambulatory Visit: Payer: Self-pay

## 2022-06-02 MED ORDER — ATORVASTATIN CALCIUM 40 MG PO TABS: 40.0000 mg | ORAL_TABLET | Freq: Every day | ORAL | 0 refills | Status: DC

## 2022-06-02 NOTE — Patient Instructions (Signed)
Visit Information  Thank you for taking time to visit with me today. Please don't hesitate to contact me if I can be of assistance to you.   Following are the goals we discussed today:   Goals Addressed             This Visit's Progress    RNCM: Effective Management of HLD       Care Coordination Interventions: Lab Results  Component Value Date   CHOL 197 05/28/2022   HDL 46 (L) 05/28/2022   LDLCALC 116 (H) 05/28/2022   TRIG 248 (H) 05/28/2022   CHOLHDL 4.3 05/28/2022    Provider established cholesterol goals reviewed. Review and education. Let the patient know that she needs to call the office and get an appointment for follow up. Counseled on importance of regular laboratory monitoring as prescribed. Review of needing to have levels checked after change in medications Provided HLD educational materials Reviewed role and benefits of statin for ASCVD risk reduction Discussed strategies to manage statin-induced myalgias Reviewed importance of limiting foods high in cholesterol. Education and support given.  Screening for signs and symptoms of depression related to chronic disease state Assessed social determinant of health barriers            Our next appointment is by telephone on 08-11-2022 at 2 pm  Please call the care guide team at [phone] if you need to cancel or reschedule your appointment.   If you are experiencing a Mental Health or [place] or need someone to talk to, please call the Suicide and Crisis Lifeline: 988 call the [place] National Suicide Prevention Lifeline: 4327912893 or TTY: 7785992935 TTY 307-880-2356) to talk to a trained counselor call 1-800-273-TALK (toll free, 24 hour hotline)  Patient verbalizes understanding of instructions and care plan provided today and agrees to view in [place]. Active MyChart status and patient understanding of how to access instructions and care plan via MyChart confirmed with patient.      Telephone follow up appointment with care management team member scheduled for: 08-11-2022 at 2 pm  Franks Field, MSN, [place] [facility] Mobile: [phone]

## 2022-06-02 NOTE — Patient Outreach (Signed)
  Care Coordination   Follow Up Visit Note   06/02/2022 Name: [name] MRN: [id number] DOB: 1941/02/18  [name] is a 81 y.o. year old female who sees Baity, Coralie Keens, NP for primary care. I spoke with  [name] by phone today.  What matters to the patients health and wellness today?  Make sure she is taking medications correctly and her cholesterol levels come down.    Goals Addressed             This Visit's Progress    RNCM: Effective Management of HLD       Care Coordination Interventions: Lab Results  Component Value Date   CHOL 197 05/28/2022   HDL 46 (L) 05/28/2022   LDLCALC 116 (H) 05/28/2022   TRIG 248 (H) 05/28/2022   CHOLHDL 4.3 05/28/2022    Provider established cholesterol goals reviewed. Review and education. Let the patient know that she needs to call the office and get an appointment for follow up. Counseled on importance of regular laboratory monitoring as prescribed. Review of needing to have levels checked after change in medications Provided HLD educational materials Reviewed role and benefits of statin for ASCVD risk reduction Discussed strategies to manage statin-induced myalgias Reviewed importance of limiting foods high in cholesterol. Education and support given.  Screening for signs and symptoms of depression related to chronic disease state Assessed social determinant of health barriers            SDOH assessments and interventions completed:  Yes  SDOH Interventions Today    Flowsheet Row Most Recent Value  SDOH Interventions   Utilities Interventions Intervention Not Indicated  Stress Interventions Intervention Not Indicated        Care Coordination Interventions Activated:  Yes  Care Coordination Interventions:  Yes, provided   Follow up plan: Follow up call scheduled for 08-11-2022 at 2 pm    Encounter Outcome:  Pt. Visit Completed   Noreene Larsson RN, MSN, [place] [facility] Mobile: [phone]

## 2022-06-09 ENCOUNTER — Telehealth: Payer: PPO

## 2022-06-23 ENCOUNTER — Ambulatory Visit (INDEPENDENT_AMBULATORY_CARE_PROVIDER_SITE_OTHER): Payer: PPO | Admitting: Internal Medicine

## 2022-06-23 ENCOUNTER — Encounter: Payer: Self-pay | Admitting: Internal Medicine

## 2022-06-23 VITALS — BP 128/80 | HR 63 | Temp 97.3°F | Wt 167.0 lb

## 2022-06-23 DIAGNOSIS — R413 Other amnesia: Secondary | ICD-10-CM

## 2022-06-23 DIAGNOSIS — H9313 Tinnitus, bilateral: Secondary | ICD-10-CM

## 2022-06-23 DIAGNOSIS — R5383 Other fatigue: Secondary | ICD-10-CM

## 2022-06-23 DIAGNOSIS — Z23 Encounter for immunization: Secondary | ICD-10-CM

## 2022-06-23 DIAGNOSIS — R42 Dizziness and giddiness: Secondary | ICD-10-CM

## 2022-06-23 DIAGNOSIS — R2689 Other abnormalities of gait and mobility: Secondary | ICD-10-CM | POA: Diagnosis not present

## 2022-06-23 NOTE — Progress Notes (Signed)
Subjective:    Patient ID: [name], female    DOB: [birth year], 81 y.o.   MRN: [id number]  HPI  Patient presents to clinic today with c/o fatigue and a ringing her head. She reports intermittent dizziness but denies vision changes. She denies runny nose, nasal congestion, ear pain, sore throat, cough or shortness. She denies chest pain, nausea, vomiting, diarrhea, constipation, or blood in her stool. She denies urinary or vaginal symptoms. She has chronic back pain but denies pain anywhere else. She reports she staggers at times when she walks. She doe have difficulty with memory. She reports her appetite is good. She reports anxiety and depression intermittently. She reports she sleeps well at night. She has been seen by ENT, was advised she needs a hearing aid.  Review of Systems     Past Medical History:  Diagnosis Date   Allergy    Anemia    Anxiety    Cancer ([place])    skin cancer   Hyperlipidemia    Hypertension    Hypothyroidism    Sciatica    Sleep apnea    does not use cpap   Thrombocytopenia (HCC)    Thyroid disease     Current Outpatient Medications  Medication Sig Dispense Refill   acetaminophen (TYLENOL) 500 MG tablet Take 500 mg by mouth every 6 (six) hours as needed for moderate pain.      amLODipine (NORVASC) 10 MG tablet TAKE 1 TABLET(10 MG) BY MOUTH DAILY 90 tablet 1   atorvastatin (LIPITOR) 40 MG tablet Take 1 tablet (40 mg total) by mouth daily. 90 tablet 0   cetirizine (ZYRTEC) 10 MG tablet TAKE 1 TABLET(10 MG) BY MOUTH DAILY (Patient not taking: Reported on 04/02/2022) 90 tablet 0   citalopram (CELEXA) 20 MG tablet TAKE 1 TABLET(20 MG) BY MOUTH DAILY 90 tablet 0   levothyroxine (SYNTHROID) 150 MCG tablet TAKE 1 TABLET(150 MCG) BY MOUTH DAILY 90 tablet 1   losartan (COZAAR) 100 MG tablet TAKE 1 TABLET(100 MG) BY MOUTH DAILY 90 tablet 1   metoprolol succinate (TOPROL-XL) 50 MG 24 hr tablet TAKE 1 TABLET BY MOUTH ONCE A DAY WITH OR IMMEDIATELY FOLLOWING  A MEAL 90 tablet 1   Multiple Vitamin (MULTIVITAMIN) capsule Take 1 capsule by mouth daily.     rOPINIRole (REQUIP) 0.5 MG tablet TAKE 1 TABLET(0.5 MG) BY MOUTH AT BEDTIME 90 tablet 0   tizanidine (ZANAFLEX) 2 MG capsule Take 1 capsule (2 mg total) by mouth at bedtime as needed for muscle spasms. (Patient not taking: Reported on 04/02/2022) 15 capsule 0   traZODone (DESYREL) 50 MG tablet TAKE 1 TABLET(50 MG) BY MOUTH AT BEDTIME AS NEEDED FOR SLEEP 90 tablet 0   No current facility-administered medications for this visit.    Allergies  Allergen Reactions   Codeine Nausea Only    Tolerates oxycodone   Morphine Nausea And Vomiting   Ace Inhibitors Cough   Adhesive [Tape] Other (See Comments) and Rash    whelps    Family History  Problem Relation Age of Onset   Dementia Mother    Dementia Sister    Heart attack Brother    Diabetes Brother    Breast cancer Paternal Aunt    Heart attack Son    Hypercalcemia Neg Hx    Cancer Neg Hx     Social History   Socioeconomic History   Marital status: Married    Spouse name: Not on file   Number of children: 4  Years of education: Not on file   Highest education level: Not on file  Occupational History   Occupation: retired   Occupation: Retired  Tobacco Use   Smoking status: Every Day    Packs/day: 0.50    Years: 40.00    Total pack years: 20.00    Types: Cigarettes   Smokeless tobacco: Never  Vaping Use   Vaping Use: Never used  Substance and Sexual Activity   Alcohol use: No   Drug use: No   Sexual activity: Not Currently  Other Topics Concern   Not on file  Social History Narrative   Not on file   Social Determinants of Health   Financial Resource Strain: Low Risk  (12/16/2021)   Overall Financial Resource Strain (CARDIA)    Difficulty of Paying Living Expenses: Not hard at all  Food Insecurity: No Food Insecurity (12/16/2021)   Hunger Vital Sign    Worried About Running Out of Food in the Last Year: Never true     [place] in the Last Year: Never true  Transportation Needs: No Transportation Needs (12/16/2021)   PRAPARE - Hydrologist (Medical): No    Lack of Transportation (Non-Medical): No  Physical Activity: Sufficiently Active (12/16/2021)   Exercise Vital Sign    Days of Exercise per Week: 5 days    Minutes of Exercise per Session: 30 min  Stress: No Stress Concern Present (06/02/2022)   [place]    Feeling of Stress : Not at all  Social Connections: Moderately Integrated (12/16/2021)   Social Connection and Isolation Panel [NHANES]    Frequency of Communication with Friends and Family: Once a week    Frequency of Social Gatherings with Friends and Family: Once a week    Attends Religious Services: More than 4 times per year    Active Member of [employer] or Organizations: Yes    Attends Archivist Meetings: More than 4 times per year    Marital Status: Married  Human resources officer Violence: Not At Risk (06/24/2021)   Humiliation, Afraid, Rape, and Kick questionnaire    Fear of Current or Ex-Partner: No    Emotionally Abused: No    Physically Abused: No    Sexually Abused: No     Constitutional: Patient reports fatigue.  Denies fever, malaise, headache or abrupt weight changes.  HEENT: Patient reports ringing in the ears.  Denies eye pain, eye redness, ear pain, wax buildup, runny nose, nasal congestion, bloody nose, or sore throat. Respiratory: Denies difficulty breathing, shortness of breath, cough or sputum production.   Cardiovascular: Denies chest pain, chest tightness, palpitations or swelling in the hands or feet.  Gastrointestinal: Denies abdominal pain, bloating, constipation, diarrhea or blood in the stool.  GU: Denies urgency, frequency, pain with urination, burning sensation, blood in urine, odor or discharge. Musculoskeletal: Patient reports chronic back pain, shuffling gait.   Denies decrease in range of motion, difficulty with gait, muscle pain or joint swelling.  Skin: Denies redness, rashes, lesions or ulcercations.  Neurological: Patient reports intermittent dizziness, difficulty with memory.  Denies difficulty with speech or problems with balance and coordination.  Psych: Patient reports intermittent anxiety and depression.  Denies SI/HI.  No other specific complaints in a complete review of systems (except as listed in HPI above).  Objective:   Physical Exam BP 128/80 (BP Location: Left Arm, Patient Position: Sitting, Cuff Size: Large)   Pulse 63  Temp (!) 97.3 F (36.3 C) (Temporal)   Wt 167 lb (75.8 kg)   LMP  (LMP Unknown)   SpO2 99%   BMI 31.04 kg/m   Wt Readings from Last 3 Encounters:  04/02/22 167 lb (75.8 kg)  02/19/22 167 lb (75.8 kg)  02/11/22 168 lb 3.2 oz (76.3 kg)    General: Appears her stated age, obese, in NAD. Skin: Warm, dry and intact.  Senile upper noted. HEENT: Head: normal shape and size; Eyes: sclera white, no icterus, conjunctiva pink, PERRLA and EOMs intact; Ears: Tm's gray and intact, normal light reflex; Cardiovascular: Normal rate and rhythm. S1,S2 noted.  No murmur, rubs or gallops noted.  Trace BLE edema. No carotid bruits noted. Pulmonary/Chest: Normal effort and positive vesicular breath sounds. No respiratory distress. No wheezes, rales or ronchi noted.  Musculoskeletal: Shuffling gait.  No difficulty with gait.  Neurological: Alert and oriented but repeats herself frequently.  She has obviously trouble with recall.  Gross motor skills intact, she has slight difficulty with fine motor coordination. Psychiatric: Mood and affect normal. Behavior is normal. Judgment and thought content normal.   BMET    Component Value Date/Time   NA 139 02/11/2022 1307   NA 145 (H) 12/21/2017 1017   NA 139 08/26/2012 2035   K 3.6 02/11/2022 1307   K 3.3 (L) 08/26/2012 2035   CL 106 02/11/2022 1307   CL 105 08/26/2012 2035    CO2 26 02/11/2022 1307   CO2 29 08/26/2012 2035   GLUCOSE 106 (H) 02/11/2022 1307   GLUCOSE 97 08/26/2012 2035   BUN 17 02/11/2022 1307   BUN 13 12/21/2017 1017   BUN 13 08/26/2012 2035   CREATININE 0.84 02/11/2022 1307   CREATININE 0.86 07/29/2021 1359   CALCIUM 9.6 02/11/2022 1307   CALCIUM 9.9 08/26/2012 2035   GFRNONAA >60 02/11/2022 1307   GFRNONAA 66 09/11/2020 1147   GFRAA 76 09/11/2020 1147    Lipid Panel     Component Value Date/Time   CHOL 197 05/28/2022 0849   CHOL 181 12/21/2017 1017   TRIG 248 (H) 05/28/2022 0849   HDL 46 (L) 05/28/2022 0849   HDL 50 12/21/2017 1017   CHOLHDL 4.3 05/28/2022 0849   LDLCALC 116 (H) 05/28/2022 0849    CBC    Component Value Date/Time   WBC 8.5 02/11/2022 1307   RBC 4.60 02/11/2022 1307   HGB 15.4 (H) 02/11/2022 1307   HGB 14.8 12/21/2017 1017   HCT 45.1 02/11/2022 1307   HCT 42.5 12/21/2017 1017   PLT 139 (L) 02/11/2022 1307   PLT 141 (L) 12/21/2017 1017   MCV 98.0 02/11/2022 1307   MCV 93 12/21/2017 1017   MCV 92 08/26/2012 2035   MCH 33.5 02/11/2022 1307   MCHC 34.1 02/11/2022 1307   RDW 12.7 02/11/2022 1307   RDW 12.8 12/21/2017 1017   RDW 13.1 08/26/2012 2035   LYMPHSABS 2.8 02/11/2022 1307   LYMPHSABS 2.5 12/21/2017 1017   MONOABS 0.8 02/11/2022 1307   EOSABS 0.1 02/11/2022 1307   EOSABS 0.3 12/21/2017 1017   BASOSABS 0.1 02/11/2022 1307   BASOSABS 0.0 12/21/2017 1017    Hgb A1C Lab Results  Component Value Date   HGBA1C 5.3 05/08/2021           Assessment & Plan:   Fatigue, Tinnitus, Dizziness, Difficulty with Memory, Shuffling Gait:  I am concerned that there is something neurologically wrong such as dementia We will obtain an MRI of her brain at  this time She has already been seen by ENT and advised that there was not a problem with her ears but that she needed hearing aids however she does not want to get hearing aids at this time Medications reviewed, could consider weaning the citalopram,  ropinirole and trazodone, will discuss this with her at her follow-up visit  RTC in 1 month for follow-up of chronic conditions [name], NP

## 2022-06-23 NOTE — Patient Instructions (Signed)
Dizziness Dizziness is a common problem. It makes you feel unsteady or light-headed. You may feel like you are about to pass out (faint). Dizziness can lead to getting hurt if you stumble or fall. Dizziness can be caused by many things, including: Medicines. Not having enough water in your body (dehydration). Illness. Follow these instructions at home: Eating and drinking  Drink enough fluid to keep your pee (urine) pale yellow. This helps to keep you from getting dehydrated. Try to drink more clear fluids, such as water. Do not drink alcohol. Limit how much caffeine you drink or eat, if your doctor tells you to do that. Limit how much salt (sodium) you drink or eat, if your doctor tells you to do that. Activity  Avoid making quick movements. Stand up slowly from sitting in a chair, and steady yourself until you feel okay. In the morning, first sit up on the side of the bed. When you feel okay, stand up slowly while you hold onto something. Do this until you know that your balance is okay. If you need to stand in one place for a long time, move your legs often. Tighten and relax the muscles in your legs while you are standing. Do not drive or use machinery if you feel dizzy. Avoid bending down if you feel dizzy. Place items in your home so you can reach them easily without leaning over. Lifestyle Do not smoke or use any products that contain nicotine or tobacco. If you need help quitting, ask your doctor. Try to lower your stress level. You can do this by using methods such as yoga or meditation. Talk with your doctor if you need help. General instructions Watch your dizziness for any changes. Take over-the-counter and prescription medicines only as told by your doctor. Talk with your doctor if you think that you are dizzy because of a medicine that you are taking. Tell a friend or a family member that you are feeling dizzy. If he or she notices any changes in your behavior, have this  person call your doctor. Keep all follow-up visits. Contact a doctor if: Your dizziness does not go away. Your dizziness or light-headedness gets worse. You feel like you may vomit (are nauseous). You have trouble hearing. You have new symptoms. You are unsteady on your feet. You feel like the room is spinning. You have neck pain or a stiff neck. You have a fever. Get help right away if: You vomit or have watery poop (diarrhea), and you cannot eat or drink anything. You have trouble: Talking. Walking. Swallowing. Using your arms, hands, or legs. You feel generally weak. You are not thinking clearly, or you have trouble forming sentences. A friend or family member may notice this. You have: Chest pain. Pain in your belly (abdomen). Shortness of breath. Sweating. Your vision changes. You are bleeding. You have a very bad headache. These symptoms may be an emergency. Get help right away. Call your local emergency services (911 in the [place] Do not wait to see if the symptoms will go away. Do not drive yourself to the hospital. Summary Dizziness makes you feel unsteady or light-headed. You may feel like you are about to pass out (faint). Drink enough fluid to keep your pee (urine) pale yellow. Do not drink alcohol. Avoid making quick movements if you feel dizzy. Watch your dizziness for any changes. This information is not intended to replace advice given to you by your health care provider. Make sure you discuss any questions   you have with your health care provider. Document Revised: 08/13/2020 Document Reviewed: 08/13/2020 Elsevier Patient Education  2023 Elsevier Inc.  

## 2022-06-26 ENCOUNTER — Other Ambulatory Visit: Payer: Self-pay | Admitting: Internal Medicine

## 2022-06-26 DIAGNOSIS — T85192A Other mechanical complication of implanted electronic neurostimulator (electrode) of spinal cord, initial encounter: Secondary | ICD-10-CM

## 2022-07-01 ENCOUNTER — Ambulatory Visit: Admission: RE | Admit: 2022-07-01 | Payer: PPO | Source: Ambulatory Visit

## 2022-07-01 ENCOUNTER — Other Ambulatory Visit: Payer: Self-pay | Admitting: Internal Medicine

## 2022-07-01 DIAGNOSIS — F5104 Psychophysiologic insomnia: Secondary | ICD-10-CM

## 2022-07-01 DIAGNOSIS — G2581 Restless legs syndrome: Secondary | ICD-10-CM

## 2022-07-01 NOTE — Telephone Encounter (Signed)
Requested Prescriptions  Pending Prescriptions Disp Refills  . traZODone (DESYREL) 50 MG tablet [Pharmacy Med Name: TRAZODONE '50MG'$  TABLETS] 90 tablet 0    Sig: TAKE 1 TABLET(50 MG) BY MOUTH AT BEDTIME AS NEEDED FOR SLEEP     Psychiatry: Antidepressants - Serotonin Modulator Passed - 07/01/2022  9:46 AM      Passed - Valid encounter within last 6 months    Recent Outpatient Visits          1 week ago [facility] [place], Coralie Keens, NP   3 months ago Chronic right ear pain   [place], DO   4 months ago Encounter for general adult medical examination with abnormal findings   [facility], Coralie Keens, NP   7 months ago Primary hypertension   [facility] [place], Coralie Keens, NP   7 months ago Mass of right side of neck   [facility] [place], Coralie Keens, NP      Future Appointments            In 3 weeks [place], Coralie Keens, NP [facility]           . rOPINIRole (REQUIP) 0.5 MG tablet [Pharmacy Med Name: ROPINIROLE 0.'5MG'$  TABLETS] 90 tablet 0    Sig: TAKE 1 TABLET(0.5 MG) BY MOUTH AT BEDTIME     Neurology:  Parkinsonian Agents Passed - 07/01/2022  9:46 AM      Passed - Last BP in normal range    BP Readings from Last 1 Encounters:  06/23/22 128/80         Passed - Last Heart Rate in normal range    Pulse Readings from Last 1 Encounters:  06/23/22 63         Passed - Valid encounter within last 12 months    Recent Outpatient Visits          1 week ago [facility], Coralie Keens, NP   3 months ago Chronic right ear pain   [place], DO   4 months ago Encounter for general adult medical examination with abnormal findings   [facility] [place], Coralie Keens, NP   7 months ago Primary hypertension   [facility] [place], Coralie Keens, NP   7 months ago Mass  of right side of neck   [facility] [place], Coralie Keens, NP      Future Appointments            In 3 weeks Garnette Gunner, Coralie Keens, NP [facility]

## 2022-07-07 NOTE — H&P (View-Only) (Signed)
Referring Physician:  [name], NP [address] [place],  [place] [address]  Primary Physician:  [name], NP  History of Present Illness: 07/07/2022 [name] is here today with a chief complaint of wanting to discuss removing her spinal cord stimulator. It has not helped since 2011.  She now needs to get an MRI scan but does not have an MRI compatible stimulator.  She would like it removed.  Past Surgery: Spinal cord stimulator placed about 20+ years but she can't recall the doctor that put it in.   [name] has no symptoms of cervical myelopathy.  The symptoms are causing a significant impact on the patient's life.   Review of Systems:  A 10 point review of systems is negative, except for the pertinent positives and negatives detailed in the HPI.  Past Medical History: Past Medical History:  Diagnosis Date   Allergy    Anemia    Anxiety    Cancer ([place])    skin cancer   Hyperlipidemia    Hypertension    Hypothyroidism    Sciatica    Sleep apnea    does not use cpap   Thrombocytopenia (HCC)    Thyroid disease     Past Surgical History: Past Surgical History:  Procedure Laterality Date   ABDOMINAL HYSTERECTOMY     APPENDECTOMY     BACK SURGERY     lower   BILATERAL CARPAL TUNNEL RELEASE     BREAST CYST ASPIRATION Left    neg   COLONOSCOPY     COLONOSCOPY WITH PROPOFOL N/A 04/23/2021   Procedure: COLONOSCOPY WITH PROPOFOL;  Surgeon: Jocelyn Manifold, MD;  Location: [facility];  Service: Endoscopy;  Laterality: N/A;   ENTROPIAN REPAIR Left 06/08/2020   Procedure: ENTROPION REPAIR, SUTURES ENTROPION REPAIR, EXTENSIVE LEFT;  Surgeon: Jocelyn Starch, MD;  Location: [facility];  Service: Ophthalmology;  Laterality: Left;   JOINT REPLACEMENT     KNEE SURGERY Bilateral    KNEE SURGERY     OPEN REDUCTION INTERNAL FIXATION (ORIF) DISTAL RADIAL FRACTURE Left 06/23/2019   Procedure: OPEN REDUCTION INTERNAL FIXATION (ORIF)  DISTAL RADIAL FRACTURE;  Surgeon: Jocelyn Knows, MD;  Location: [facility];  Service: Orthopedics;  Laterality: Left;   PARATHYROIDECTOMY  04/27/2020   SPINAL CORD STIMULATOR IMPLANT Right    THYROIDECTOMY Right 04/27/2020   Procedure: PARATHYROIDECTOMY;  Surgeon: Jocelyn Hausen, MD;  Location: [facility];  Service: General;  Laterality: Right;   TOTAL KNEE REVISION Right 08/13/2015   Procedure: TOTAL KNEE REVISION;  Surgeon: Jocelyn Cancel, MD;  Location: [facility];  Service: Orthopedics;  Laterality: Right;    Allergies: Allergies as of 07/08/2022 - Review Complete 06/23/2022  Allergen Reaction Noted   Codeine Nausea Only 02/22/2014   Morphine Nausea And Vomiting 06/12/2015   Ace inhibitors Cough 03/16/2018   Adhesive [tape] Other (See Comments) and Rash 02/22/2014    Medications: No outpatient medications have been marked as taking for the 07/08/22 encounter (Appointment) with Jocelyn Maw, MD.    Social History: Social History   Tobacco Use   Smoking status: Every Day    Packs/day: 0.50    Years: 40.00    Total pack years: 20.00    Types: Cigarettes   Smokeless tobacco: Never  Vaping Use   Vaping Use: Never used  Substance Use Topics   Alcohol use: No   Drug use: No    Family Medical History: Family History  Problem Relation Age  of Onset   Dementia Mother    Dementia Sister    Heart attack Brother    Diabetes Brother    Breast cancer Paternal Aunt    Heart attack Son    Hypercalcemia Neg Hx    Cancer Neg Hx     Physical Examination: There were no vitals filed for this visit.  General: Patient is well developed, well nourished, calm, collected, and in no apparent distress. Attention to examination is appropriate.  Neck:   Supple.  Full range of motion.  Respiratory: Patient is breathing without any difficulty.   NEUROLOGICAL:     Awake, alert, oriented to person, place, and time.  Speech is clear and fluent. Fund of knowledge is appropriate.   Cranial  Nerves: Pupils equal round and reactive to light.  Facial tone is symmetric.  Facial sensation is symmetric. Shoulder shrug is symmetric. Tongue protrusion is midline.  There is no pronator drift.  ROM of spine: full.    Strength: Side Biceps Triceps Deltoid Interossei Grip Wrist Ext. Wrist Flex.  R '5 5 5 5 5 5 5  '$ L '5 5 5 5 5 5 5   '$ Side Iliopsoas Quads Hamstring PF DF EHL  R '5 5 5 5 5 5  '$ L '5 5 5 5 5 5   '$ Reflexes are 1+ and symmetric at the biceps, triceps, brachioradialis, patella and achilles.   Hoffman's is absent.   Bilateral upper and lower extremity sensation is intact to light touch.    No evidence of dysmetria noted.  Gait is normal.     Medical Decision Making  Imaging: Prior imaging of her spine reviewed.  She has a percutaneous spinal cord stimulator device with her pulse generator in her right buttock.  I have personally reviewed the images and agree with the above interpretation.  Assessment and Plan: [name] is a pleasant 81 y.o. female with a failed spinal cord stimulator.  She would like it removed as she now has need for an MRI scan.  I think this is reasonable.  We reviewed the surgical plan of removing her pulse generator and spinal cord stimulator leads.  There is some chance she will need a laminectomy, but more likely she will not.  I discussed the planned procedure at length with the patient, including the risks, benefits, alternatives, and indications. The risks discussed include but are not limited to bleeding, infection, need for reoperation, spinal fluid leak, stroke, vision loss, anesthetic complication, coma, paralysis, and even death. I also described in detail that improvement was not guaranteed.  The patient expressed understanding of these risks, and asked that we proceed with surgery. I described the surgery in layman's terms, and gave ample opportunity for questions, which were answered to the best of my ability.    I spent a total of 30  minutes in face-to-face and non-face-to-face activities related to this patient's care today.  Thank you for involving me in the care of this patient.      [name] [name] MD, [facility] Neurosurgery

## 2022-07-07 NOTE — Progress Notes (Unsigned)
Referring Physician:  [name], NP [address] [place],  [place] [address]  Primary Physician:  [name], NP  History of Present Illness: 07/07/2022 [name] is here today with a chief complaint of wanting to discuss removing her spinal cord stimulator. It has not helped since 2011.  She now needs to get an MRI scan but does not have an MRI compatible stimulator.  She would like it removed.  Past Surgery: Spinal cord stimulator placed about 20+ years but she can't recall the doctor that put it in.   [name] has no symptoms of cervical myelopathy.  The symptoms are causing a significant impact on the patient's life.   Review of Systems:  A 10 point review of systems is negative, except for the pertinent positives and negatives detailed in the HPI.  Past Medical History: Past Medical History:  Diagnosis Date   Allergy    Anemia    Anxiety    Cancer ([place])    skin cancer   Hyperlipidemia    Hypertension    Hypothyroidism    Sciatica    Sleep apnea    does not use cpap   Thrombocytopenia (HCC)    Thyroid disease     Past Surgical History: Past Surgical History:  Procedure Laterality Date   ABDOMINAL HYSTERECTOMY     APPENDECTOMY     BACK SURGERY     lower   BILATERAL CARPAL TUNNEL RELEASE     BREAST CYST ASPIRATION Left    neg   COLONOSCOPY     COLONOSCOPY WITH PROPOFOL N/A 04/23/2021   Procedure: COLONOSCOPY WITH PROPOFOL;  Surgeon: Virgel Manifold, MD;  Location: [facility];  Service: Endoscopy;  Laterality: N/A;   ENTROPIAN REPAIR Left 06/08/2020   Procedure: ENTROPION REPAIR, SUTURES ENTROPION REPAIR, EXTENSIVE LEFT;  Surgeon: Karle Starch, MD;  Location: [facility];  Service: Ophthalmology;  Laterality: Left;   JOINT REPLACEMENT     KNEE SURGERY Bilateral    KNEE SURGERY     OPEN REDUCTION INTERNAL FIXATION (ORIF) DISTAL RADIAL FRACTURE Left 06/23/2019   Procedure: OPEN REDUCTION INTERNAL FIXATION (ORIF)  DISTAL RADIAL FRACTURE;  Surgeon: Hessie Knows, MD;  Location: [facility];  Service: Orthopedics;  Laterality: Left;   PARATHYROIDECTOMY  04/27/2020   SPINAL CORD STIMULATOR IMPLANT Right    THYROIDECTOMY Right 04/27/2020   Procedure: PARATHYROIDECTOMY;  Surgeon: Johnathan Hausen, MD;  Location: [facility];  Service: General;  Laterality: Right;   TOTAL KNEE REVISION Right 08/13/2015   Procedure: TOTAL KNEE REVISION;  Surgeon: Paralee Cancel, MD;  Location: [facility];  Service: Orthopedics;  Laterality: Right;    Allergies: Allergies as of 07/08/2022 - Review Complete 06/23/2022  Allergen Reaction Noted   Codeine Nausea Only 02/22/2014   Morphine Nausea And Vomiting 06/12/2015   Ace inhibitors Cough 03/16/2018   Adhesive [tape] Other (See Comments) and Rash 02/22/2014    Medications: No outpatient medications have been marked as taking for the 07/08/22 encounter (Appointment) with Meade Maw, MD.    Social History: Social History   Tobacco Use   Smoking status: Every Day    Packs/day: 0.50    Years: 40.00    Total pack years: 20.00    Types: Cigarettes   Smokeless tobacco: Never  Vaping Use   Vaping Use: Never used  Substance Use Topics   Alcohol use: No   Drug use: No    Family Medical History: Family History  Problem Relation Age  of Onset   Dementia Mother    Dementia Sister    Heart attack Brother    Diabetes Brother    Breast cancer Paternal Aunt    Heart attack Son    Hypercalcemia Neg Hx    Cancer Neg Hx     Physical Examination: There were no vitals filed for this visit.  General: Patient is well developed, well nourished, calm, collected, and in no apparent distress. Attention to examination is appropriate.  Neck:   Supple.  Full range of motion.  Respiratory: Patient is breathing without any difficulty.   NEUROLOGICAL:     Awake, alert, oriented to person, place, and time.  Speech is clear and fluent. Fund of knowledge is appropriate.   Cranial  Nerves: Pupils equal round and reactive to light.  Facial tone is symmetric.  Facial sensation is symmetric. Shoulder shrug is symmetric. Tongue protrusion is midline.  There is no pronator drift.  ROM of spine: full.    Strength: Side Biceps Triceps Deltoid Interossei Grip Wrist Ext. Wrist Flex.  R '5 5 5 5 5 5 5  '$ L '5 5 5 5 5 5 5   '$ Side Iliopsoas Quads Hamstring PF DF EHL  R '5 5 5 5 5 5  '$ L '5 5 5 5 5 5   '$ Reflexes are 1+ and symmetric at the biceps, triceps, brachioradialis, patella and achilles.   Hoffman's is absent.   Bilateral upper and lower extremity sensation is intact to light touch.    No evidence of dysmetria noted.  Gait is normal.     Medical Decision Making  Imaging: Prior imaging of her spine reviewed.  She has a percutaneous spinal cord stimulator device with her pulse generator in her right buttock.  I have personally reviewed the images and agree with the above interpretation.  Assessment and Plan: [name] is a pleasant 81 y.o. female with a failed spinal cord stimulator.  She would like it removed as she now has need for an MRI scan.  I think this is reasonable.  We reviewed the surgical plan of removing her pulse generator and spinal cord stimulator leads.  There is some chance she will need a laminectomy, but more likely she will not.  I discussed the planned procedure at length with the patient, including the risks, benefits, alternatives, and indications. The risks discussed include but are not limited to bleeding, infection, need for reoperation, spinal fluid leak, stroke, vision loss, anesthetic complication, coma, paralysis, and even death. I also described in detail that improvement was not guaranteed.  The patient expressed understanding of these risks, and asked that we proceed with surgery. I described the surgery in layman's terms, and gave ample opportunity for questions, which were answered to the best of my ability.    I spent a total of 30  minutes in face-to-face and non-face-to-face activities related to this patient's care today.  Thank you for involving me in the care of this patient.      [name] [name] MD, [facility] Neurosurgery

## 2022-07-08 ENCOUNTER — Ambulatory Visit (INDEPENDENT_AMBULATORY_CARE_PROVIDER_SITE_OTHER): Payer: PPO | Admitting: Neurosurgery

## 2022-07-08 ENCOUNTER — Encounter: Payer: Self-pay | Admitting: Neurosurgery

## 2022-07-08 ENCOUNTER — Other Ambulatory Visit: Payer: Self-pay

## 2022-07-08 VITALS — BP 136/76 | Ht 61.5 in | Wt 164.0 lb

## 2022-07-08 DIAGNOSIS — T85192S Other mechanical complication of implanted electronic neurostimulator (electrode) of spinal cord, sequela: Secondary | ICD-10-CM

## 2022-07-08 DIAGNOSIS — T85192A Other mechanical complication of implanted electronic neurostimulator (electrode) of spinal cord, initial encounter: Secondary | ICD-10-CM | POA: Diagnosis not present

## 2022-07-08 DIAGNOSIS — Z01818 Encounter for other preprocedural examination: Secondary | ICD-10-CM

## 2022-07-08 NOTE — Patient Instructions (Signed)
Please see below for information in regards to your upcoming surgery:  Planned surgery: removal of spinal cord stimulator   Surgery date: 07/28/22 - you will find out your arrival time the business day before your surgery.   Pre-op appointment at [facility]: we will call you with a date/time for this. Pre-admit testing is located on the first floor of the [facility], [place], [address].   Pre-op labs may be done at your pre-op appointment. You are not required to fast for these labs.    Should you need to change your pre-op appointment, please call Pre-admit testing at [phone].   If you have FMLA/disability paperwork, please drop it off or fax it to [phone], attention [place].   If you have any questions/concerns before or after surgery, you can reach [place] at [phone], or you can send a mychart message. If you have a concern after hours that cannot wait until normal business hours, you can call [phone] and ask to page the neurosurgeon on call for [place].    Appointments/FMLA & disability paperwork: [place] Nurse: Ophelia Shoulder  Medical assistant: Raquel Sarna Physician Assistant's: Cooper Render & Geronimo Boot Surgeon: Meade Maw, MD

## 2022-07-15 ENCOUNTER — Other Ambulatory Visit: Payer: Self-pay | Admitting: Internal Medicine

## 2022-07-15 DIAGNOSIS — I10 Essential (primary) hypertension: Secondary | ICD-10-CM

## 2022-07-15 DIAGNOSIS — J4489 Other specified chronic obstructive pulmonary disease: Secondary | ICD-10-CM

## 2022-07-15 DIAGNOSIS — E7800 Pure hypercholesterolemia, unspecified: Secondary | ICD-10-CM

## 2022-07-16 ENCOUNTER — Telehealth: Payer: PPO

## 2022-07-16 ENCOUNTER — Telehealth: Payer: Self-pay | Admitting: Pharmacist

## 2022-07-16 NOTE — Telephone Encounter (Signed)
   Outreach Note  07/16/2022 Name: [name] MRN: [id number] DOB: [birth year]  Referred by: [name], NP Reason for referral : No chief complaint on file.   Was unable to reach patient via telephone today and left message with patient's spouse.   Follow Up Plan: Will attempt to reach patient by telephone again within the next 30 days  Wallace Cullens, PharmD, [facility] [place] [phone]

## 2022-07-18 ENCOUNTER — Other Ambulatory Visit: Payer: Self-pay

## 2022-07-18 ENCOUNTER — Encounter
Admission: RE | Admit: 2022-07-18 | Discharge: 2022-07-18 | Disposition: A | Discharge status: Home / Self Care | Payer: PPO | Source: Ambulatory Visit | Attending: Neurosurgery | Admitting: Neurosurgery

## 2022-07-18 VITALS — BP 147/72 | HR 63 | Resp 16 | Wt 165.3 lb

## 2022-07-18 DIAGNOSIS — Z01818 Encounter for other preprocedural examination: Secondary | ICD-10-CM | POA: Insufficient documentation

## 2022-07-18 DIAGNOSIS — Z01812 Encounter for preprocedural laboratory examination: Secondary | ICD-10-CM

## 2022-07-18 HISTORY — DX: Unspecified osteoarthritis, unspecified site: M19.90

## 2022-07-18 LAB — URINALYSIS, ROUTINE W REFLEX MICROSCOPIC
Bilirubin Urine: NEGATIVE
Glucose, UA: NEGATIVE mg/dL
Hgb urine dipstick: NEGATIVE
Ketones, ur: 5 mg/dL — AB
Leukocytes,Ua: NEGATIVE
Nitrite: NEGATIVE
Protein, ur: 30 mg/dL — AB
Specific Gravity, Urine: 1.02 (ref 1.005–1.030)
pH: 5 (ref 5.0–8.0)

## 2022-07-18 LAB — BASIC METABOLIC PANEL
Anion gap: 8 (ref 5–15)
BUN: 12 mg/dL (ref 8–23)
CO2: 26 mmol/L (ref 22–32)
Calcium: 9.9 mg/dL (ref 8.9–10.3)
Chloride: 109 mmol/L (ref 98–111)
Creatinine, Ser: 0.74 mg/dL (ref 0.44–1.00)
GFR, Estimated: >60 mL/min (ref >60)
Glucose, Bld: 88 mg/dL (ref 70–99)
Potassium: 3.9 mmol/L (ref 3.5–5.1)
Sodium: 143 mmol/L (ref 135–145)

## 2022-07-18 LAB — CBC
HCT: 45.9 % (ref 36.0–46.0)
Hemoglobin: 15.3 g/dL — ABNORMAL HIGH (ref 12.0–15.0)
MCH: 33.2 pg (ref 26.0–34.0)
MCHC: 33.3 g/dL (ref 30.0–36.0)
MCV: 99.6 fL (ref 80.0–100.0)
Platelets: 126 10*3/uL — ABNORMAL LOW (ref 150–400)
RBC: 4.61 MIL/uL (ref 3.87–5.11)
RDW: 12.9 % (ref 11.5–15.5)
WBC: 7.2 10*3/uL (ref 4.0–10.5)
nRBC: 0 % (ref 0.0–0.2)

## 2022-07-18 LAB — SURGICAL PCR SCREEN
MRSA, PCR: NEGATIVE
Staphylococcus aureus: NEGATIVE

## 2022-07-18 NOTE — Patient Instructions (Addendum)
Your procedure is scheduled on: 07/28/22 - Monday Report to the Registration Desk on the 1st floor of the [facility]. To find out your arrival time, please call [phone] between 1PM - 3PM on: 07/25/22 - Friday If your arrival time is 6:00 am, do not arrive prior to that time as the [facility] entrance doors do not open until 6:00 am.  REMEMBER: Instructions that are not followed completely may result in serious medical risk, up to and including death; or upon the discretion of your surgeon and anesthesiologist your surgery may need to be rescheduled.  Do not eat food after midnight the night before surgery.  No gum chewing, lozengers or hard candies.  You may however, drink CLEAR liquids up to 2 hours before you are scheduled to arrive for your surgery. Do not drink anything within 2 hours of your scheduled arrival time.  Clear liquids include: - water  - apple juice without pulp - gatorade (not RED colors) - black coffee or tea (Do NOT add milk or creamers to the coffee or tea) Do NOT drink anything that is not on this list.  TAKE THESE MEDICATIONS THE MORNING OF SURGERY WITH A SIP OF WATER:  - citalopram (CELEXA)  - levothyroxine (SYNTHROID) - metoprolol succinate (TOPROL-XL)    HOLD losartan (COZAAR) on the day of surgery.  One week prior to surgery: Stop Anti-inflammatories (NSAIDS) such as Advil, Aleve, Ibuprofen, Motrin, Naproxen, Naprosyn and Aspirin based products such as Excedrin, Goodys Powder, BC Powder.  Stop ANY OVER THE COUNTER supplements until after surgery.  You may however, continue to take Tylenol if needed for pain up until the day of surgery.  No Alcohol for 24 hours before or after surgery.  No Smoking including e-cigarettes for 24 hours prior to surgery.  No chewable tobacco products for at least 6 hours prior to surgery.  No nicotine patches on the day of surgery.  Do not use any "recreational" drugs for at least a week prior to your surgery.   Please be advised that the combination of cocaine and anesthesia may have negative outcomes, up to and including death. If you test positive for cocaine, your surgery will be cancelled.  On the morning of surgery brush your teeth with toothpaste and water, you may rinse your mouth with mouthwash if you wish. Do not swallow any toothpaste or mouthwash.  Use CHG Soap or wipes as directed on instruction sheet.  Do not wear jewelry, make-up, hairpins, clips or nail polish.  Do not wear lotions, powders, or perfumes.   Do not shave body from the neck down 48 hours prior to surgery just in case you cut yourself which could leave a site for infection.  Also, freshly shaved skin may become irritated if using the CHG soap.  Contact lenses, hearing aids and dentures may not be worn into surgery.  Do not bring valuables to the hospital. [facility] is not responsible for any missing/lost belongings or valuables.   Notify your doctor if there is any change in your medical condition (cold, fever, infection).  Wear comfortable clothing (specific to your surgery type) to the hospital.  After surgery, you can help prevent lung complications by doing breathing exercises.  Take deep breaths and cough every 1-2 hours. Your doctor may order a device called an Incentive Spirometer to help you take deep breaths. When coughing or sneezing, hold a pillow firmly against your incision with both hands. This is called "splinting." Doing this helps protect your  incision. It also decreases belly discomfort.  If you are being admitted to the hospital overnight, leave your suitcase in the car. After surgery it may be brought to your room.  If you are being discharged the day of surgery, you will not be allowed to drive home. You will need a responsible adult (18 years or older) to drive you home and stay with you that night.   If you are taking public transportation, you will need to have a responsible adult (18  years or older) with you. Please confirm with your physician that it is acceptable to use public transportation.   Please call the [facility] Dept. at [phone] if you have any questions about these instructions.  Surgery Visitation Policy:  Patients undergoing a surgery or procedure may have two family members or support persons with them as long as the person is not COVID-19 positive or experiencing its symptoms.   Inpatient Visitation:    Visiting hours are 7 a.m. to 8 p.m. Up to four visitors are allowed at one time in a patient room, including children. The visitors may rotate out with other people during the day. One designated support person (adult) may remain overnight.

## 2022-07-21 ENCOUNTER — Other Ambulatory Visit: Payer: Self-pay | Admitting: Internal Medicine

## 2022-07-21 DIAGNOSIS — F419 Anxiety disorder, unspecified: Secondary | ICD-10-CM

## 2022-07-22 NOTE — Telephone Encounter (Signed)
Requested Prescriptions  Pending Prescriptions Disp Refills  . citalopram (CELEXA) 20 MG tablet [Pharmacy Med Name: CITALOPRAM '20MG'$  TABLETS] 90 tablet 0    Sig: TAKE 1 TABLET(20 MG) BY MOUTH DAILY     Psychiatry:  Antidepressants - SSRI Passed - 07/21/2022  9:26 AM      Passed - Valid encounter within last 6 months    Recent Outpatient Visits          4 weeks ago [facility] [place], Coralie Keens, NP   3 months ago Chronic right ear pain   [place], DO   5 months ago Encounter for general adult medical examination with abnormal findings   [facility], Coralie Keens, NP   7 months ago Primary hypertension   [facility], Coralie Keens, NP   7 months ago Mass of right side of neck   [facility] [place], Coralie Keens, NP      Future Appointments            In 6 days [place], Coralie Keens, NP [facility], [facility]

## 2022-07-28 ENCOUNTER — Ambulatory Visit
Admission: RE | Admit: 2022-07-28 | Discharge: 2022-07-28 | Disposition: A | Discharge status: Home / Self Care | Payer: PPO | Source: Ambulatory Visit | Attending: Neurosurgery | Admitting: Neurosurgery

## 2022-07-28 ENCOUNTER — Ambulatory Visit: Payer: PPO

## 2022-07-28 ENCOUNTER — Encounter
Admission: RE | Disposition: A | Discharge status: Home / Self Care | Payer: Self-pay | Source: Ambulatory Visit | Attending: Neurosurgery

## 2022-07-28 ENCOUNTER — Other Ambulatory Visit: Payer: Self-pay

## 2022-07-28 ENCOUNTER — Ambulatory Visit: Payer: PPO | Admitting: Certified Registered Nurse Anesthetist

## 2022-07-28 ENCOUNTER — Ambulatory Visit: Payer: PPO | Admitting: Internal Medicine

## 2022-07-28 ENCOUNTER — Ambulatory Visit: Payer: PPO | Admitting: Urgent Care

## 2022-07-28 ENCOUNTER — Encounter: Payer: Self-pay | Admitting: Neurosurgery

## 2022-07-28 DIAGNOSIS — Z01818 Encounter for other preprocedural examination: Secondary | ICD-10-CM

## 2022-07-28 DIAGNOSIS — T85192A Other mechanical complication of implanted electronic neurostimulator (electrode) of spinal cord, initial encounter: Secondary | ICD-10-CM

## 2022-07-28 DIAGNOSIS — I10 Essential (primary) hypertension: Secondary | ICD-10-CM | POA: Diagnosis not present

## 2022-07-28 DIAGNOSIS — J449 Chronic obstructive pulmonary disease, unspecified: Secondary | ICD-10-CM | POA: Insufficient documentation

## 2022-07-28 DIAGNOSIS — G4730 Sleep apnea, unspecified: Secondary | ICD-10-CM | POA: Insufficient documentation

## 2022-07-28 DIAGNOSIS — F419 Anxiety disorder, unspecified: Secondary | ICD-10-CM | POA: Diagnosis not present

## 2022-07-28 DIAGNOSIS — Z462 Encounter for fitting and adjustment of other devices related to nervous system and special senses: Secondary | ICD-10-CM | POA: Diagnosis not present

## 2022-07-28 DIAGNOSIS — E039 Hypothyroidism, unspecified: Secondary | ICD-10-CM | POA: Diagnosis not present

## 2022-07-28 DIAGNOSIS — F17210 Nicotine dependence, cigarettes, uncomplicated: Secondary | ICD-10-CM | POA: Insufficient documentation

## 2022-07-28 DIAGNOSIS — G894 Chronic pain syndrome: Secondary | ICD-10-CM

## 2022-07-28 DIAGNOSIS — Z969 Presence of functional implant, unspecified: Secondary | ICD-10-CM | POA: Diagnosis not present

## 2022-07-28 DIAGNOSIS — T85192D Other mechanical complication of implanted electronic neurostimulator (electrode) of spinal cord, subsequent encounter: Secondary | ICD-10-CM

## 2022-07-28 DIAGNOSIS — E785 Hyperlipidemia, unspecified: Secondary | ICD-10-CM | POA: Insufficient documentation

## 2022-07-28 HISTORY — PX: SPINAL CORD STIMULATOR REMOVAL: SHX5379

## 2022-07-28 SURGERY — LUMBAR SPINAL CORD STIMULATOR REMOVAL
Anesthesia: General | Site: Back

## 2022-07-28 MED ORDER — ACETAMINOPHEN 10 MG/ML IV SOLN
INTRAVENOUS | Status: AC
  Filled 2022-07-28: qty 100

## 2022-07-28 MED ORDER — SODIUM CHLORIDE FLUSH 0.9 % IV SOLN
INTRAVENOUS | Status: AC
  Filled 2022-07-28: qty 20

## 2022-07-28 MED ORDER — FENTANYL CITRATE (PF) 100 MCG/2ML IJ SOLN
INTRAMUSCULAR | Status: DC | PRN
  Administered 2022-07-28 (×2): 50 ug via INTRAVENOUS

## 2022-07-28 MED ORDER — METOPROLOL TARTRATE 5 MG/5ML IV SOLN
INTRAVENOUS | Status: DC | PRN
  Administered 2022-07-28 (×2): 1 mg via INTRAVENOUS

## 2022-07-28 MED ORDER — DEXMEDETOMIDINE HCL IN NACL 80 MCG/20ML IV SOLN
INTRAVENOUS | Status: DC | PRN
  Administered 2022-07-28 (×2): 4 ug via BUCCAL

## 2022-07-28 MED ORDER — ESMOLOL HCL 100 MG/10ML IV SOLN
INTRAVENOUS | Status: DC | PRN
  Administered 2022-07-28: 20 mg via INTRAVENOUS
  Administered 2022-07-28: 30 mg via INTRAVENOUS

## 2022-07-28 MED ORDER — BUPIVACAINE-EPINEPHRINE (PF) 0.5% -1:200000 IJ SOLN
INTRAMUSCULAR | Status: DC | PRN
  Administered 2022-07-28: 2 mL via PERINEURAL

## 2022-07-28 MED ORDER — OXYCODONE HCL 5 MG PO TABS: 5.0000 mg | ORAL_TABLET | ORAL | 0 refills | Status: DC | PRN

## 2022-07-28 MED ORDER — ROCURONIUM BROMIDE 100 MG/10ML IV SOLN
INTRAVENOUS | Status: DC | PRN
  Administered 2022-07-28: 50 mg via INTRAVENOUS

## 2022-07-28 MED ORDER — PROPOFOL 10 MG/ML IV BOLUS
INTRAVENOUS | Status: AC
  Filled 2022-07-28: qty 20

## 2022-07-28 MED ORDER — FAMOTIDINE 20 MG PO TABS: 20.0000 mg | ORAL_TABLET | Freq: Once | ORAL | Status: AC

## 2022-07-28 MED ORDER — LACTATED RINGERS IV SOLN: INTRAVENOUS | Status: DC

## 2022-07-28 MED ORDER — 0.9 % SODIUM CHLORIDE (POUR BTL) OPTIME
TOPICAL | Status: DC | PRN
  Administered 2022-07-28: 500 mL

## 2022-07-28 MED ORDER — BUPIVACAINE-EPINEPHRINE (PF) 0.5% -1:200000 IJ SOLN
INTRAMUSCULAR | Status: AC
  Filled 2022-07-28: qty 30

## 2022-07-28 MED ORDER — GLYCOPYRROLATE 0.2 MG/ML IJ SOLN
INTRAMUSCULAR | Status: DC | PRN
  Administered 2022-07-28: .2 mg via INTRAVENOUS

## 2022-07-28 MED ORDER — DEXAMETHASONE SODIUM PHOSPHATE 10 MG/ML IJ SOLN
INTRAMUSCULAR | Status: DC | PRN
  Administered 2022-07-28: 5 mg via INTRAVENOUS

## 2022-07-28 MED ORDER — BUPIVACAINE LIPOSOME 1.3 % IJ SUSP
INTRAMUSCULAR | Status: AC
  Filled 2022-07-28: qty 20

## 2022-07-28 MED ORDER — BUPIVACAINE HCL (PF) 0.5 % IJ SOLN
INTRAMUSCULAR | Status: AC
  Filled 2022-07-28: qty 30

## 2022-07-28 MED ORDER — CHLORHEXIDINE GLUCONATE 0.12 % MT SOLN: 15.0000 mL | Freq: Once | OROMUCOSAL | Status: AC

## 2022-07-28 MED ORDER — METHOCARBAMOL 500 MG PO TABS: 500.0000 mg | ORAL_TABLET | Freq: Three times a day (TID) | ORAL | 0 refills | Status: DC | PRN

## 2022-07-28 MED ORDER — OXYCODONE HCL 5 MG PO TABS
5.0000 mg | ORAL_TABLET | Freq: Once | ORAL | Status: AC
  Administered 2022-07-28: 5 mg via ORAL

## 2022-07-28 MED ORDER — FENTANYL CITRATE (PF) 100 MCG/2ML IJ SOLN
INTRAMUSCULAR | Status: AC
  Filled 2022-07-28: qty 2

## 2022-07-28 MED ORDER — SUGAMMADEX SODIUM 200 MG/2ML IV SOLN
INTRAVENOUS | Status: DC | PRN
  Administered 2022-07-28: 150 mg via INTRAVENOUS

## 2022-07-28 MED ORDER — OXYCODONE HCL 5 MG PO TABS
ORAL_TABLET | ORAL | Status: AC
  Filled 2022-07-28: qty 1

## 2022-07-28 MED ORDER — ONDANSETRON HCL 4 MG/2ML IJ SOLN
INTRAMUSCULAR | Status: DC | PRN
  Administered 2022-07-28: 4 mg via INTRAVENOUS

## 2022-07-28 MED ORDER — ORAL CARE MOUTH RINSE: 15.0000 mL | Freq: Once | OROMUCOSAL | Status: AC

## 2022-07-28 MED ORDER — LACTATED RINGERS IV SOLN: INTRAVENOUS | Status: DC | PRN

## 2022-07-28 MED ORDER — CEFAZOLIN SODIUM-DEXTROSE 2-4 GM/100ML-% IV SOLN
INTRAVENOUS | Status: AC
  Filled 2022-07-28: qty 100

## 2022-07-28 MED ORDER — FENTANYL CITRATE (PF) 100 MCG/2ML IJ SOLN
25.0000 ug | INTRAMUSCULAR | Status: DC | PRN
  Administered 2022-07-28 (×4): 25 ug via INTRAVENOUS

## 2022-07-28 MED ORDER — PROPOFOL 10 MG/ML IV BOLUS
INTRAVENOUS | Status: DC | PRN
  Administered 2022-07-28: 30 mg via INTRAVENOUS
  Administered 2022-07-28: 20 mg via INTRAVENOUS
  Administered 2022-07-28: 50 mg via INTRAVENOUS

## 2022-07-28 MED ORDER — ACETAMINOPHEN 10 MG/ML IV SOLN
INTRAVENOUS | Status: DC | PRN
  Administered 2022-07-28: 1000 mg via INTRAVENOUS

## 2022-07-28 MED ORDER — CEFAZOLIN SODIUM-DEXTROSE 2-4 GM/100ML-% IV SOLN
2.0000 g | INTRAVENOUS | Status: AC
  Administered 2022-07-28: 2 g via INTRAVENOUS

## 2022-07-28 MED ORDER — LIDOCAINE HCL (CARDIAC) PF 100 MG/5ML IV SOSY
PREFILLED_SYRINGE | INTRAVENOUS | Status: DC | PRN
  Administered 2022-07-28: 80 mg via INTRAVENOUS

## 2022-07-28 MED ORDER — CEFAZOLIN IN SODIUM CHLORIDE 2-0.9 GM/100ML-% IV SOLN
2.0000 g | Freq: Once | INTRAVENOUS | Status: DC
  Filled 2022-07-28: qty 100

## 2022-07-28 MED ORDER — FAMOTIDINE 20 MG PO TABS
ORAL_TABLET | ORAL | Status: AC
  Administered 2022-07-28: 20 mg via ORAL
  Filled 2022-07-28: qty 1

## 2022-07-28 MED ORDER — CHLORHEXIDINE GLUCONATE 0.12 % MT SOLN
OROMUCOSAL | Status: AC
  Administered 2022-07-28: 15 mL via OROMUCOSAL
  Filled 2022-07-28: qty 15

## 2022-07-28 MED ORDER — ONDANSETRON HCL 4 MG/2ML IJ SOLN: 4.0000 mg | Freq: Once | INTRAMUSCULAR | Status: DC | PRN

## 2022-07-28 SURGICAL SUPPLY — 55 items
ADH SKN CLS APL DERMABOND .7 (GAUZE/BANDAGES/DRESSINGS) ×1
AGENT HMST KT MTR STRL THRMB (HEMOSTASIS)
APL PRP STRL LF DISP 70% ISPRP (MISCELLANEOUS)
BASIN KIT SINGLE STR (MISCELLANEOUS) ×1 IMPLANT
BUR NEURO DRILL SOFT 3.0X3.8M (BURR) ×1 IMPLANT
CHLORAPREP W/TINT 26 (MISCELLANEOUS) ×2 IMPLANT
CNTNR SPEC 2.5X3XGRAD LEK (MISCELLANEOUS) ×1
CONT SPEC 4OZ STER OR WHT (MISCELLANEOUS) ×1
CONT SPEC 4OZ STRL OR WHT (MISCELLANEOUS) ×1
CONTAINER SPEC 2.5X3XGRAD LEK (MISCELLANEOUS) ×1 IMPLANT
COUNTER NEEDLE 20/40 LG (NEEDLE) ×1 IMPLANT
DERMABOND ADVANCED .7 DNX12 (GAUZE/BANDAGES/DRESSINGS) ×1 IMPLANT
DRAPE C ARM PK CFD 31 SPINE (DRAPES) ×1 IMPLANT
DRAPE LAPAROTOMY 100X77 ABD (DRAPES) ×1 IMPLANT
DRAPE SURG 17X11 SM STRL (DRAPES) ×1 IMPLANT
DRSG OPSITE POSTOP 3X4 (GAUZE/BANDAGES/DRESSINGS) IMPLANT
DRSG OPSITE POSTOP 4X6 (GAUZE/BANDAGES/DRESSINGS) IMPLANT
ELECT CAUTERY BLADE TIP 2.5 (TIP) ×1
ELECT EZSTD 165MM 6.5IN (MISCELLANEOUS) ×1
ELECT REM PT RETURN 9FT ADLT (ELECTROSURGICAL) ×1
ELECTRODE CAUTERY BLDE TIP 2.5 (TIP) ×1 IMPLANT
ELECTRODE EZSTD 165MM 6.5IN (MISCELLANEOUS) ×1 IMPLANT
ELECTRODE REM PT RTRN 9FT ADLT (ELECTROSURGICAL) ×1 IMPLANT
GAUZE 4X4 16PLY ~~LOC~~+RFID DBL (SPONGE) ×1 IMPLANT
GLOVE BIOGEL PI IND STRL 6.5 (GLOVE) ×1 IMPLANT
GLOVE BIOGEL PI IND STRL 8.5 (GLOVE) ×1 IMPLANT
GLOVE SURG SYN 6.5 ES PF (GLOVE) ×1 IMPLANT
GLOVE SURG SYN 6.5 PF PI (GLOVE) ×1 IMPLANT
GLOVE SURG SYN 8.5  E (GLOVE) ×3
GLOVE SURG SYN 8.5 E (GLOVE) ×3 IMPLANT
GLOVE SURG SYN 8.5 PF PI (GLOVE) ×3 IMPLANT
GOWN SRG LRG LVL 4 IMPRV REINF (GOWNS) ×1 IMPLANT
GOWN SRG XL LVL 3 NONREINFORCE (GOWNS) ×1 IMPLANT
GOWN STRL NON-REIN TWL XL LVL3 (GOWNS) ×1
GOWN STRL REIN LRG LVL4 (GOWNS)
GRADUATE 1200CC STRL 31836 (MISCELLANEOUS) ×1 IMPLANT
KIT SPINAL PRONEVIEW (KITS) ×1 IMPLANT
MANIFOLD NEPTUNE II (INSTRUMENTS) ×1 IMPLANT
MARKER SKIN DUAL TIP RULER LAB (MISCELLANEOUS) ×1 IMPLANT
NDL SAFETY ECLIP 18X1.5 (MISCELLANEOUS) ×2 IMPLANT
NS IRRIG 1000ML POUR BTL (IV SOLUTION) ×1 IMPLANT
NS IRRIG 500ML POUR BTL (IV SOLUTION) IMPLANT
PACK LAMINECTOMY NEURO (CUSTOM PROCEDURE TRAY) ×1 IMPLANT
PAD ARMBOARD 7.5X6 YLW CONV (MISCELLANEOUS) ×1 IMPLANT
SOLUTION IRRIG SURGIPHOR (IV SOLUTION) ×1 IMPLANT
SURGIFLO W/THROMBIN 8M KIT (HEMOSTASIS) IMPLANT
SUT DVC VLOC 3-0 CL 6 P-12 (SUTURE) ×1 IMPLANT
SUT V-LOC 90 ABS DVC 3-0 CL (SUTURE) IMPLANT
SUT VIC AB 0 CT1 27 (SUTURE) ×1
SUT VIC AB 0 CT1 27XCR 8 STRN (SUTURE) ×1 IMPLANT
SUT VIC AB 2-0 CT1 18 (SUTURE) ×1 IMPLANT
SYR 30ML LL (SYRINGE) ×1 IMPLANT
TOWEL OR 17X26 4PK STRL BLUE (TOWEL DISPOSABLE) ×3 IMPLANT
TRAP FLUID SMOKE EVACUATOR (MISCELLANEOUS) ×1 IMPLANT
TUBING CONNECTING 10 (TUBING) ×1 IMPLANT

## 2022-07-28 NOTE — Transfer of Care (Signed)
Immediate Anesthesia Transfer of Care Note  Patient: [name]  Procedure(s) Performed: REMOVAL OF SPINAL CORD STIMULATOR (Back)  Patient Location: PACU  Anesthesia Type:General  Level of Consciousness: awake, alert , and oriented  Airway & Oxygen Therapy: Patient Spontanous Breathing and Patient connected to face mask oxygen  Post-op Assessment: Report given to RN and Post -op Vital signs reviewed and stable  Post vital signs: Reviewed and stable  Last Vitals:  Vitals Value Taken Time  BP 107/53 07/28/22 0821  Temp 36.4 C 07/28/22 0821  Pulse 82 07/28/22 0821  Resp 17 07/28/22 0821  SpO2      Last Pain:  Vitals:   07/28/22 2527  PainSc: 0-No pain         Complications: No notable events documented.

## 2022-07-28 NOTE — Anesthesia Preprocedure Evaluation (Deleted)
Anesthesia Evaluation  Patient identified by MRN, date of birth, ID band Patient awake  General Assessment Comment:History noted Dr. Green  Patient decided general anesthesia not MAC. Dr. Thomas aware. Dr. Green   Reviewed: Allergy & Precautions, NPO status , Patient's Chart, lab work & pertinent test results  Airway Mallampati: II       Dental   Pulmonary shortness of breath, asthma , sleep apnea , Current Smoker and Patient abstained from smoking.   breath sounds clear to auscultation       Cardiovascular hypertension,  Rhythm:Regular Rate:Normal     Neuro/Psych  Headaches PSYCHIATRIC DISORDERS         GI/Hepatic Neg liver ROS,GERD  ,,  Endo/Other  negative endocrine ROS    Renal/GU negative Renal ROS     Musculoskeletal   Abdominal   Peds  Hematology   Anesthesia Other Findings   Reproductive/Obstetrics                             Anesthesia Physical Anesthesia Plan  ASA: 3  Anesthesia Plan: General   Post-op Pain Management: Tylenol PO (pre-op)*   Induction:   PONV Risk Score and Plan: 3 and Ondansetron, Dexamethasone and Midazolam  Airway Management Planned: Oral ETT  Additional Equipment:   Intra-op Plan:   Post-operative Plan: Extubation in OR  Informed Consent: I have reviewed the patients History and Physical, chart, labs and discussed the procedure including the risks, benefits and alternatives for the proposed anesthesia with the patient or authorized representative who has indicated his/her understanding and acceptance.     Dental advisory given  Plan Discussed with: CRNA and Anesthesiologist  Anesthesia Plan Comments:        Anesthesia Quick Evaluation  

## 2022-07-28 NOTE — Anesthesia Preprocedure Evaluation (Signed)
Anesthesia Evaluation  Patient identified by MRN, date of birth, ID band Patient awake    Reviewed: Allergy & Precautions, H&P , NPO status , Patient's Chart, lab work & pertinent test results  History of Anesthesia Complications Negative for: history of anesthetic complications  Airway Mallampati: II  TM Distance: >3 FB Neck ROM: full    Dental  (+) Missing, Edentulous Lower, Edentulous Upper, Dental Advidsory Given   Pulmonary neg shortness of breath, sleep apnea and Continuous Positive Airway Pressure Ventilation , COPD, neg recent URI, Current Smoker          Cardiovascular Exercise Tolerance: Good hypertension, (-) angina (-) Past MI, (-) Cardiac Stents and (-) DOE (-) dysrhythmias (-) Valvular Problems/Murmurs     Neuro/Psych neg Seizures PSYCHIATRIC DISORDERS Anxiety      Neuromuscular disease    GI/Hepatic negative GI ROS, Neg liver ROS,neg GERD  ,,  Endo/Other  neg diabetesHypothyroidism    Renal/GU      Musculoskeletal  (+) Arthritis ,    Abdominal   Peds  Hematology negative hematology ROS (+)   Anesthesia Other Findings Past Medical History: No date: Allergy No date: Anemia No date: Anxiety No date: Cancer ([place])     Comment:  skin cancer No date: Hyperlipidemia No date: Hypertension No date: Hypothyroidism No date: Sciatica No date: Sleep apnea     Comment:  does not use cpap No date: Thyroid disease  Past Surgical History: No date: ABDOMINAL HYSTERECTOMY No date: APPENDECTOMY No date: BACK SURGERY     Comment:  lower No date: BILATERAL CARPAL TUNNEL RELEASE No date: BREAST CYST ASPIRATION; Left     Comment:  neg No date: COLONOSCOPY No date: JOINT REPLACEMENT No date: KNEE SURGERY; Bilateral No date: KNEE SURGERY 08/13/2015: TOTAL KNEE REVISION; Right     Comment:  Procedure: TOTAL KNEE REVISION;  Surgeon: Paralee Cancel,               MD;  Location: [facility];  Service: Orthopedics;                 Laterality: Right;  BMI    Body Mass Index: 31.66 kg/m      Reproductive/Obstetrics negative OB ROS                             Anesthesia Physical Anesthesia Plan  ASA: 3  Anesthesia Plan: General   Post-op Pain Management:    Induction: Intravenous  PONV Risk Score and Plan: 3 and Treatment may vary due to age or medical condition, Ondansetron and Dexamethasone  Airway Management Planned: Oral ETT  Additional Equipment:   Intra-op Plan:   Post-operative Plan: Extubation in OR  Informed Consent: I have reviewed the patients History and Physical, chart, labs and discussed the procedure including the risks, benefits and alternatives for the proposed anesthesia with the patient or authorized representative who has indicated his/her understanding and acceptance.     Dental Advisory Given  Plan Discussed with: Anesthesiologist, CRNA and Surgeon  Anesthesia Plan Comments: (Patient consented for risks of anesthesia including but not limited to:  - adverse reactions to medications - damage to teeth, lips or other oral mucosa - sore throat or hoarseness - Damage to heart, brain, lungs or loss of life  Patient voiced understanding.)        Anesthesia Quick Evaluation

## 2022-07-28 NOTE — Discharge Instructions (Addendum)
Your surgeon has performed an operation on your lumbar spine to remove your spinal cord stimulator. Many times, patients feel better immediately after surgery and can "overdo it." Even if you feel well, it is important that you follow these activity guidelines. If you do not let your back heal properly from the surgery, you can increase the chance of a disc herniation and/or return of your symptoms. The following are instructions to help in your recovery once you have been discharged from the hospital.  * It is ok to take NSAIDs after surgery.  Activity    No bending, lifting, or twisting ("BLT"). Avoid lifting objects heavier than 10 pounds (gallon milk jug).  Where possible, avoid household activities that involve lifting, bending, pushing, or pulling such as laundry, vacuuming, grocery shopping, and childcare. Try to arrange for help from friends and family for these activities while your back heals.  Increase physical activity slowly as tolerated.  Taking short walks is encouraged, but avoid strenuous exercise. Do not jog, run, bicycle, lift weights, or participate in any other exercises unless specifically allowed by your doctor. Avoid prolonged sitting, including car rides.  Talk to your doctor before resuming sexual activity.  You should not drive until cleared by your doctor.  Until released by your doctor, you should not return to work or school.  You should rest at home and let your body heal.   You may shower two days after your surgery.  After showering, lightly dab your incision dry. Do not take a tub bath or go swimming for 3 weeks, or until approved by your doctor at your follow-up appointment.  If you smoke, we strongly recommend that you quit.  Smoking has been proven to interfere with normal healing in your back and will dramatically reduce the success rate of your surgery. Please contact [facility] (800-QUIT-NOW) and use the resources at www.[facility].com for assistance in  stopping smoking.  Surgical Incision   If you have a dressing on your incision, you may remove it three days after your surgery. Keep your incision area clean and dry.  You have glue on your incision. The glue should begin to peel away within about a week (it is fine if the steri-strips fall off before then).   Diet            You may return to your usual diet. Be sure to stay hydrated.  When to Contact [name]  Although your surgery and recovery will likely be uneventful, you may have some residual numbness, aches, and pains in your back and/or legs. This is normal and should improve in the next few weeks.  However, should you experience any of the following, contact [name] immediately: New numbness or weakness Pain that is progressively getting worse, and is not relieved by your pain medications or rest Bleeding, redness, swelling, pain, or drainage from surgical incision Chills or flu-like symptoms Fever greater than 101.0 F (38.3 C) Problems with bowel or bladder functions Difficulty breathing or shortness of breath Warmth, tenderness, or swelling in your calf  Contact Information During office hours (Monday-Friday 9 am to 5 pm), please call your physician at [phone] and ask for Berdine Addison After hours and weekends, please call [phone] and speak with the neurosurgeon on call For a life-threatening emergency, call [place]   The drugs that you were given will stay in your system until tomorrow so for the next 24 hours you should not:  Drive an automobile  Make any legal decisions Drink any alcoholic beverage   You may resume regular meals tomorrow.  Today it is better to start with liquids and gradually work up to solid foods.  You may eat anything you prefer, but it is better to start with liquids, then soup and crackers, and gradually work up to solid foods.   Please notify your doctor immediately if you have any unusual  bleeding, trouble breathing, redness and pain at the surgery site, drainage, fever, or pain not relieved by medication.    Additional Instructions:    Please contact your physician with any problems or Same Day Surgery at [phone], Monday through Friday 6 am to 4 pm, or [place] at [facility] number at [phone].

## 2022-07-28 NOTE — Op Note (Signed)
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   Due to nonfunctional device, she asked that I remove her stimulator.   Findings: successful removal of a spinal cord stimulator.   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     Preoperative Note:    Risks of surgery discussed in clinic.   Operative Note:      The patient was then brought from the preoperative center with intravenous access established.  The patient underwent general anesthesia and endotracheal tube intubation, then was rotated on the [facility] table where all pressure points were appropriately padded.   The incisions were identified and marked.  The skin was then thoroughly cleansed.  Perioperative antibiotic prophylaxis was administered.  Sterile prep and drapes were then applied and a timeout was then observed.    The buttock incision was opened and the pulse generator identified and removed from the pocket.  The leads were divided and the generator handed to the scrub.  The midline incision was opened and the securing devices identified.  These were removed from the pocket and the leads were pulled out.  The contacts were all present.  Confirmatory xray was taken to indicate no contacts were left.   Both sites were irrigated.  The wounds were closed in layers with 0 and 2-0 vicryl.  The skin was approximated with monocryl. A sterile dressing was applied.     Patient was then rotated back to the preoperative bed awakened from anesthesia and taken to recovery. All counts are correct in this case.   I performed the entire procedure.       Meade Maw MD

## 2022-07-28 NOTE — Anesthesia Procedure Notes (Signed)
Procedure Name: Intubation Date/Time: 07/28/2022 7:26 AM  Performed by: Gayland Curry, CRNAPre-anesthesia Checklist: Patient identified, Emergency Drugs available, Suction available and Patient being monitored Patient Re-evaluated:Patient Re-evaluated prior to induction Oxygen Delivery Method: Circle system utilized Preoxygenation: Pre-oxygenation with 100% oxygen Induction Type: IV induction Ventilation: Oral airway inserted - appropriate to patient size and Mask ventilation without difficulty Laryngoscope Size: McGraph and 3 Tube type: Oral Tube size: 6.5 mm Number of attempts: 1 Airway Equipment and Method: Stylet Placement Confirmation: ETT inserted through vocal cords under direct vision, positive ETCO2 and breath sounds checked- equal and bilateral Secured at: 20 cm Tube secured with: Tape Dental Injury: Teeth and Oropharynx as per pre-operative assessment

## 2022-07-28 NOTE — OR Nursing (Signed)
MD notified of minimal bleeding at 2 incision sites.  Will come to evaluate before dc home.

## 2022-07-28 NOTE — Discharge Summary (Signed)
Physician Discharge Summary  Patient ID: [name] MRN: [id number] DOB/AGE: Jan 02, 1941 81 y.o.  Admit date: 07/28/2022 Discharge date: 07/28/2022  Admission Diagnoses:  Discharge Diagnoses:  Active Problems:   Failed spinal cord stimulator [facility])   Discharged Condition: good  Hospital Course: [name] was admitted on 07/28/22 for removal of spinal cord stimulator. [name] tolerated procedure well and was found stable for discharge home.   [name] was discharged on oxycodone and robaxin.   Consults:  none  Significant Diagnostic Studies: none  Treatments: surgery: removal of spinal cord stimulator.   Discharge Exam: Blood pressure (!) 107/53, pulse 82, temperature (!) 97.5 F (36.4 C), resp. rate 17, SpO2 99 %. [name] is alert.  Dressings are c/d/I  [name] is moving all four extremities well.   Disposition: Discharge disposition: 01-Home or Self Care       Discharge Instructions     Discharge patient   Complete by: As directed    Discharge disposition: 01-Home or Self Care   Discharge patient date: 07/28/2022      Allergies as of 07/28/2022       Reactions   Codeine Nausea Only   Tolerates oxycodone   Morphine Nausea And Vomiting   Ace Inhibitors Cough   Adhesive [tape] Other (See Comments), Rash   whelps        Medication List     TAKE these medications    acetaminophen 500 MG tablet Commonly known as: TYLENOL Take 500 mg by mouth every 6 (six) hours as needed for moderate pain.   amLODipine 10 MG tablet Commonly known as: NORVASC TAKE 1 TABLET(10 MG) BY MOUTH DAILY   atorvastatin 40 MG tablet Commonly known as: LIPITOR Take 1 tablet (40 mg total) by mouth daily. What changed: how much to take   citalopram 20 MG tablet Commonly known as: CELEXA TAKE 1 TABLET(20 MG) BY MOUTH DAILY   levothyroxine 150 MCG tablet Commonly known as: SYNTHROID TAKE 1 TABLET(150 MCG) BY MOUTH DAILY   losartan 100 MG tablet Commonly known as: COZAAR TAKE 1 TABLET(100  MG) BY MOUTH DAILY   methocarbamol 500 MG tablet Commonly known as: ROBAXIN Take 1 tablet (500 mg total) by mouth every 8 (eight) hours as needed for muscle spasms.   metoprolol succinate 50 MG 24 hr tablet Commonly known as: TOPROL-XL TAKE 1 TABLET BY MOUTH ONCE A DAY WITH OR IMMEDIATELY FOLLOWING A MEAL   multivitamin capsule Take 1 capsule by mouth daily.   oxyCODONE 5 MG immediate release tablet Commonly known as: Roxicodone Take 1 tablet (5 mg total) by mouth every 4 (four) hours as needed for severe pain.   rOPINIRole 0.5 MG tablet Commonly known as: REQUIP TAKE 1 TABLET(0.5 MG) BY MOUTH AT BEDTIME   traZODone 50 MG tablet Commonly known as: DESYREL TAKE 1 TABLET(50 MG) BY MOUTH AT BEDTIME AS NEEDED FOR SLEEP        Follow-up Information     Geronimo Boot, PA-C Follow up.   Specialty: Neurosurgery Why: Follow up as scheduled for your postop appointment on 08/12/22. Contact information: [address] [place] [address] [phone]                 Signed: Jannett Celestine 07/28/2022, 8:25 AM

## 2022-07-28 NOTE — Interval H&P Note (Signed)
History and Physical Interval Note:  07/28/2022 6:54 AM  [name]  has presented today for surgery, with the diagnosis of Failure of spinal cord stimulator, sequela  - T85.192S.  The various methods of treatment have been discussed with the patient and family. After consideration of risks, benefits and other options for treatment, the patient has consented to  Procedure(s): REMOVAL OF SPINAL CORD STIMULATOR (N/A) as a surgical intervention.  The patient's history has been reviewed, patient examined, no change in status, stable for surgery.  I have reviewed the patient's chart and labs.  Questions were answered to the patient's satisfaction.    Heart sounds normal no MRG. Chest Clear to Auscultation Bilaterally.    [name]

## 2022-07-29 ENCOUNTER — Encounter: Payer: Self-pay | Admitting: Neurosurgery

## 2022-07-29 NOTE — Anesthesia Postprocedure Evaluation (Signed)
Anesthesia Post Note  Patient: [name]  Procedure(s) Performed: REMOVAL OF SPINAL CORD STIMULATOR (Back)  Patient location during evaluation: PACU Anesthesia Type: General Level of consciousness: awake and alert Pain management: pain level controlled Vital Signs Assessment: post-procedure vital signs reviewed and stable Respiratory status: spontaneous breathing, nonlabored ventilation, respiratory function stable and patient connected to nasal cannula oxygen Cardiovascular status: blood pressure returned to baseline and stable Postop Assessment: no apparent nausea or vomiting Anesthetic complications: no   No notable events documented.   Last Vitals:  Vitals:   07/28/22 0908 07/28/22 1002  BP: 113/84 119/77  Pulse: 87 82  Resp: 16 16  Temp: (!) 36.2 C   SpO2: 98% 97%    Last Pain:  Vitals:   07/29/22 0957  TempSrc:   PainSc: 8                  Martha Clan

## 2022-08-08 ENCOUNTER — Ambulatory Visit (INDEPENDENT_AMBULATORY_CARE_PROVIDER_SITE_OTHER): Payer: PPO | Admitting: Pharmacist

## 2022-08-08 DIAGNOSIS — E7800 Pure hypercholesterolemia, unspecified: Secondary | ICD-10-CM

## 2022-08-08 DIAGNOSIS — I10 Essential (primary) hypertension: Secondary | ICD-10-CM

## 2022-08-08 NOTE — Chronic Care Management (AMB) (Signed)
Chronic Care Management CCM Pharmacy Note  08/08/2022 Name:  [name] MRN:  [id number] DOB:  17-Sep-1941   Subjective: [name] is an 81 y.o. year old female who is a primary patient of Jocelyn Fenton, NP.  The CCM team was consulted for assistance with disease management and care coordination needs.    Engaged with patient by telephone for follow up visit for pharmacy case management and/or care coordination services.   Objective:  Medications Reviewed Today     Reviewed by Rennis Petty, RPH-CPP (Pharmacist) on 08/08/22 at 1014  Med List Status: <None>   Medication Order Taking? Sig Documenting Provider Last Dose Status Informant  acetaminophen (TYLENOL) 500 MG tablet [id number]  Take 500 mg by mouth every 6 (six) hours as needed for moderate pain.  [provider]  Active Self  amLODipine (NORVASC) 10 MG tablet [id number] Yes TAKE 1 TABLET(10 MG) BY MOUTH DAILY Baity, Coralie Keens, NP Taking Active   atorvastatin (LIPITOR) 40 MG tablet [id number] Yes Take 1 tablet (40 mg total) by mouth daily.  Patient taking differently: Take 20 mg by mouth daily.   Jocelyn Fenton, NP Taking Active Self  citalopram (CELEXA) 20 MG tablet [id number] Yes TAKE 1 TABLET(20 MG) BY MOUTH DAILY Jocelyn Fenton, NP Taking Active   levothyroxine (SYNTHROID) 150 MCG tablet [id number] Yes TAKE 1 TABLET(150 MCG) BY MOUTH DAILY Jocelyn Fenton, NP Taking Active   losartan (COZAAR) 100 MG tablet [id number] Yes TAKE 1 TABLET(100 MG) BY MOUTH DAILY Jocelyn Fenton, NP Taking Active   metoprolol succinate (TOPROL-XL) 50 MG 24 hr tablet [id number] Yes TAKE 1 TABLET BY MOUTH ONCE A DAY WITH OR IMMEDIATELY FOLLOWING A MEAL Baity, Coralie Keens, NP Taking Active   Multiple Vitamin (MULTIVITAMIN) capsule [id number] Yes Take 1 capsule by mouth daily. [provider] Taking Active   rOPINIRole (REQUIP) 0.5 MG tablet [id number] Yes TAKE 1 TABLET(0.5 MG) BY MOUTH AT BEDTIME Jocelyn Fenton, NP Taking  Active   traZODone (DESYREL) 50 MG tablet [id number] Yes TAKE 1 TABLET(50 MG) BY MOUTH AT BEDTIME AS NEEDED FOR SLEEP Jocelyn Fenton, NP Taking Active             Pertinent Labs:   Lab Results  Component Value Date   CHOL 197 05/28/2022   HDL 46 (L) 05/28/2022   LDLCALC 116 (H) 05/28/2022   TRIG 248 (H) 05/28/2022   CHOLHDL 4.3 05/28/2022   Lab Results  Component Value Date   CREATININE 0.74 07/18/2022   BUN 12 07/18/2022   NA 143 07/18/2022   K 3.9 07/18/2022   CL 109 07/18/2022   CO2 26 07/18/2022   BP Readings from Last 3 Encounters:  07/28/22 119/77  07/18/22 (!) 147/72  07/08/22 136/76   Pulse Readings from Last 3 Encounters:  07/28/22 82  07/18/22 63  06/23/22 63     SDOH:  (Social Determinants of Health) assessments and interventions performed:  SDOH Interventions    [place] Coordination from 06/02/2022 in [place] from 12/16/2021 in [facility] Office Visit from 08/20/2021 in [place] Chronic Care Management from 06/24/2021 in [facility] Office Visit from 05/08/2021 in [facility] Office Visit from 03/18/2021 in [place]  SDOH Interventions        Food Insecurity Interventions -- Intervention Not Indicated -- Intervention Not Indicated -- --  Housing Interventions --  Intervention Not Indicated -- -- -- --  Transportation Interventions -- Intervention Not Indicated -- Intervention Not Indicated -- --  Utilities Interventions Intervention Not Indicated -- -- -- -- --  Depression Interventions/Treatment  -- -- PHQ2-9 Score <4 Follow-up Not Indicated -- Currently on Treatment PHQ2-9 Score <4 Follow-up Not Indicated  Financial Strain Interventions -- Intervention Not Indicated -- Intervention Not Indicated -- --  Physical Activity Interventions -- Intervention Not Indicated -- Other (Comments)  [no structured  activity] -- --  Stress Interventions Intervention Not Indicated Intervention Not Indicated -- Intervention Not Indicated -- --  Social Connections Interventions -- Intervention Not Indicated -- Other (Comment)  [good support system] -- --       CCM Care Plan  Review of patient past medical history, allergies, medications, health status, including review of consultants reports, laboratory and other test data, was performed as part of comprehensive evaluation and provision of chronic care management services.   Care Plan : PharmD - Medication Management; HTN  Updates made by Rennis Petty, RPH-CPP since 08/08/2022 12:00 AM     Problem: Disease Progression      Long-Range Goal: Disease Progression Prevented or Minimized   Start Date: 07/01/2021  Expected End Date: 09/29/2021  Recent Progress: On track  Priority: High  Note:   Current Barriers:  Unable to maintain control of hypertension Lack of home blood pressure readings to share with clinical team  Pharmacist Clinical Goal(s):  patient will achieve adherence to monitoring guidelines and medication adherence to achieve therapeutic efficacy maintain control of hypertension as evidenced by blood pressure readings through collaboration with PharmD and provider.    Interventions: 1:1 collaboration with Jocelyn Fenton, NP regarding development and update of comprehensive plan of care as evidenced by provider attestation and co-signature Inter-disciplinary care team collaboration (see longitudinal plan of care) Perform chart review Patient admitted to [facility] on 07/28/2022 related to removal of spinal cord stimulator Today patient reports she is healing well since her surgery and pain improved, no longer needing either methocarbamol or oxycodone Reports continues to use weekly pillbox to organize her medications  Hypertension:   Current treatment: Amlodipine 10 mg daily Metoprolol ER 50 mg QAM Losartan 100 mg daily Denies  monitoring home blood pressure Denies hypertensive symptoms as long as she takes positional changes slowly Have counseled patient on home BP monitoring technique Have encouraged patient to start monitoring home BP, to keep a log of the results, have this record for [place] to review during telephone appointment, but to call office sooner or readings outside of established parameter or for new/worsening symptoms Patient denies interest in home monitoring at this time  Hyperlipidemia: Current treatment: atorvastatin 20 mg daily From review of chart, note per provider notes from latest lipid panel on 9/6, provider advised patient to increase atorvastatin dose to 40 mg daily. Today identify that patient has continued to take atorvastatin 20 mg daily, rather than increasing to 40 mg daily Outreach to [employer] on behalf of patient. Speak with RPh and request pharmacy fill atorvastatin 40 mg Rx for patient today and deactivate previous strengths of atorvastatin Rxs Patient to start taking atorvastatin 40 mg daily Advise patient to contact office to reschedule canceled follow up appointment with PCP Have encouraged patient to decrease saturated and trans fats in her diet   Patient Goals/Self-Care Activities patient will:  - take medications as prescribed - check blood pressure, document, and provide at future appointments  Follow Up Plan: The care management team will  reach out to the patient again in 3 months       Plan: Telephone follow up appointment with care management team member scheduled for:  11/07/2022 at 10 am  Wallace Cullens, PharmD, [place] [phone]

## 2022-08-08 NOTE — Patient Instructions (Signed)
Visit Information  Thank you for taking time to visit with me today. Please don't hesitate to contact me if I can be of assistance to you before our next scheduled telephone appointment.  Following are the goals we discussed today:   Goals Addressed             This Visit's Progress    Pharmacy Goals       Please obtain new upper arm blood pressure monitor and restart checking your home blood pressure  We recommend a blood pressure cuff that goes around your upper arm, as these are generally the most accurate.   To appropriately check your blood pressure, make sure you do the following:  1) Avoid caffeine, exercise, or tobacco products for 30 minutes before checking. 2) Sit with your back supported in a flat-backed chair. Rest your arm on something flat (arm of the chair, table, etc). 3) Sit still with your feet flat on the floor, resting, for at least 5 minutes.  4) Check your blood pressure. Take 1-2 readings.   Write down these readings and bring with you to any provider appointments. Bring your home blood pressure machine with you to a provider's office for accuracy comparison at least once a year. Make sure you take your blood pressure medications before you come to any office visit.   Feel free to call me with any questions or concerns. I look forward to our next call!   Wallace Cullens, PharmD, [place] [phone]         Our next appointment is by telephone on 11/07/2022 at 10 am  Please call the care guide team at [phone] if you need to cancel or reschedule your appointment.    Patient verbalizes understanding of instructions and care plan provided today and agrees to view in [place]. Active MyChart status and patient understanding of how to access instructions and care plan via MyChart confirmed with patient.

## 2022-08-11 ENCOUNTER — Ambulatory Visit: Payer: Self-pay

## 2022-08-11 ENCOUNTER — Telehealth: Payer: PPO

## 2022-08-11 DIAGNOSIS — I10 Essential (primary) hypertension: Secondary | ICD-10-CM

## 2022-08-11 DIAGNOSIS — J4489 Other specified chronic obstructive pulmonary disease: Secondary | ICD-10-CM

## 2022-08-11 NOTE — Progress Notes (Unsigned)
   REFERRING PHYSICIAN:  Towanda Octave [address] [place],  [place] [address]  DOS: removal of spinal cord stimulator on 07/28/22  HISTORY OF PRESENT ILLNESS: [name] is approximately 2 weeks status post removal of spinal cord stimulator.  Was given oxycodone and robaxin on discharge from the hospital.   She has no significant back pain. She is taking prn tylenol. No pain in arms or legs.   She has noticed since the surgery that she cannot move left thumb as well as right. She cannot do a thumbs up. No pain in thumb. No numbness or tingling. No neck or arm pain.    PHYSICAL EXAMINATION:  General: Patient is well developed, well nourished, calm, collected, and in no apparent distress.   NEUROLOGICAL:  General: In no acute distress.   Awake, alert, oriented to person, place, and time.  Pupils equal round and reactive to light.  Facial tone is symmetric.     Strength:            Side Iliopsoas Quads Hamstring PF DF EHL  R '5 5 5 5 5 5  '$ L '5 5 5 5 5 5   '$ Incisions c/d/I  She cannot make thumbs up with left thumb. Can do okay sign. No tenderness about the thumb. Sensation is intact to light touch.   Strength Side Biceps Triceps Deltoid Interossei Grip Wrist Ext. Wrist Flex.  L '5 5 5 5 '$ in fingers, cannot actively abduct thumb 5 (fingers only) 5 5      ROS (Neurologic):  Negative except as noted above  IMAGING: Nothing new to review.   ASSESSMENT/PLAN:  [name] is doing well s/p above surgery. Treatment options reviewed with patient and following plan made:   - I have advised the patient to lift up to 10 pounds until 6 weeks after surgery (follow up with Dr. Izora Ribas).  - Reviewed wound care.  - No bending, twisting, or lifting.  - Continue on current medications including prn tylenol. - As above, she is unable to do a thumbs up on left. No pain. This started after her surgery. Reviewed with Dr. Izora Ribas and he agrees with referral to ortho. She wants  to hold off on this.   - Follow up as scheduled in 4 weeks and prn.   Advised to contact the office if any questions or concerns arise.  Geronimo Boot PA-C Department of neurosurgery

## 2022-08-11 NOTE — Plan of Care (Signed)
Chronic Care Management Provider Comprehensive Care Plan    08/11/2022 Name: GLENNIS BORGER MRN: [id number] DOB: 07/31/1941  Referral to Chronic Care Management (CCM) services was placed by Provider:  [name] on Date: 07-15-2022.  Chronic Condition 1: HTN Provider Assessment and Plan Controlled on Amlodipine, Losartan and Metoprolol Reinforced DASH diet and exercise for weight loss We will monitor   Expected Outcome/Goals Addressed This Visit (Provider CCM goals/Provider Assessment and plan   Goal: CCM (HYPERTENSION)  EXPECTED OUTCOME:  MONITOR,SELF- MANAGE AND REDUCE SYMPTOMS OF HYPERTENSION   Symptom Management Condition 1: Take all medications as prescribed Attend all scheduled provider appointments Call provider office for new concerns or questions  call the Suicide and Crisis Lifeline: 988 call the [place] National Suicide Prevention Lifeline: 639-041-3862 or TTY: 936-036-0378 [place] 939-607-4422) to talk to a trained counselor call 1-800-273-TALK (toll free, 24 hour hotline) if experiencing a Mental Health or [place]  check blood pressure 3 times per week learn about high blood pressure call doctor for signs and symptoms of high blood pressure keep all doctor appointments take medications for blood pressure exactly as prescribed report new symptoms to your doctor  Chronic Condition 2: COPD Provider Assessment and Plan Encourage diet and exercise for weight loss    Expected Outcome/Goals Addressed This Visit (Provider CCM goals/Provider Assessment and plan   :Goal: CCM (COPD) EXPECTED OUTCOME: MONITOR, SELF-MANAGE AND REDUCE SYMPTOMS OF COPD   Symptom Management Condition 2: Take all medications as prescribed Attend all scheduled provider appointments Call provider office for new concerns or questions  call the Suicide and Crisis Lifeline: 988 call the [place] National Suicide Prevention Lifeline: 781-474-5867 or TTY: 856-819-9041 TTY  913-214-0791) to talk to a trained counselor call 1-800-273-TALK (toll free, 24 hour hotline) if experiencing a Mental Health or [place]  eliminate smoking in my home identify and remove indoor air pollutants limit outdoor activity during cold weather listen for public air quality announcements every day do breathing exercises every day begin a symptom diary develop a rescue plan eliminate symptom triggers at home follow rescue plan if symptoms flare-up  Problem List Patient Active Problem List   Diagnosis Date Noted   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   Thrombocytopenia ([place]) 06/10/2021   Cognitive impairment 06/10/2021   Aortic atherosclerosis ([place]) 05/08/2021   Class 1 obesity due to excess calories with body mass index (BMI) of 31.0 to 31.9 in adult 03/18/2021   RLS (restless legs syndrome) 01/29/2021   Obstructive chronic bronchitis without exacerbation 01/29/2021   Hyperlipidemia 07/09/2020   Osteopenia of left femoral neck 06/09/2019   Hyperparathyroidism ([place]) 04/15/2019   Acquired hypothyroidism 08/05/2018   Anxiety 08/04/2018   Psychophysiological insomnia 08/04/2018   Osteoarthritis 04/14/2018   Hypertension 11/23/2017    Medication Management  Current Outpatient Medications:    acetaminophen (TYLENOL) 500 MG tablet, Take 500 mg by mouth every 6 (six) hours as needed for moderate pain. , Disp: , Rfl:    amLODipine (NORVASC) 10 MG tablet, TAKE 1 TABLET(10 MG) BY MOUTH DAILY, Disp: 90 tablet, Rfl: 1   atorvastatin (LIPITOR) 40 MG tablet, Take 1 tablet (40 mg total) by mouth daily. (Patient taking differently: Take 20 mg by mouth daily.), Disp: 90 tablet, Rfl: 0   citalopram (CELEXA) 20 MG tablet, TAKE 1 TABLET(20 MG) BY MOUTH DAILY, Disp: 90 tablet, Rfl: 0   levothyroxine (SYNTHROID) 150 MCG tablet, TAKE 1 TABLET(150 MCG) BY MOUTH DAILY, Disp: 90 tablet, Rfl: 1   losartan (  COZAAR) 100 MG tablet, TAKE 1 TABLET(100 MG) BY MOUTH DAILY,  Disp: 90 tablet, Rfl: 1   metoprolol succinate (TOPROL-XL) 50 MG 24 hr tablet, TAKE 1 TABLET BY MOUTH ONCE A DAY WITH OR IMMEDIATELY FOLLOWING A MEAL, Disp: 90 tablet, Rfl: 1   Multiple Vitamin (MULTIVITAMIN) capsule, Take 1 capsule by mouth daily., Disp: , Rfl:    rOPINIRole (REQUIP) 0.5 MG tablet, TAKE 1 TABLET(0.5 MG) BY MOUTH AT BEDTIME, Disp: 90 tablet, Rfl: 0   traZODone (DESYREL) 50 MG tablet, TAKE 1 TABLET(50 MG) BY MOUTH AT BEDTIME AS NEEDED FOR SLEEP, Disp: 90 tablet, Rfl: 0  Cognitive Assessment Identity Confirmed: : Name; DOB Cognitive Status: Abnormal (some memory changes and forgetfullness) Other:  : face to face visit with the patient, accompanied by her husband to the office (10-07-2021)   Functional Assessment Hearing Difficulty or Deaf: no Wear Glasses or Blind: yes Vision Management: wears glasses Concentrating, Remembering or Making Decisions Difficulty (CP): yes Concentration Management: changes in memory noted Difficulty Communicating: no Difficulty Eating/Swallowing: no Walking or Climbing Stairs Difficulty: no Dressing/Bathing Difficulty: no Doing Errands Independently Difficulty (such as shopping) (CP): no Change in Functional Status Since Onset of Current Illness/Injury: no   Caregiver Assessment  Primary Source of Support/Comfort: spouse Name of Support/Comfort Primary Source: Herbie [place]- spouse People in Home: spouse Name(s) of People in Home: Herbie [place]- husband Family Caregiver if Needed: spouse Family Caregiver Names: Herbie [place] Primary Roles/Responsibilities: retired   Planned Interventions  Take medications as prescribed   Attend all scheduled provider appointments Call provider office for new concerns or questions  call the Suicide and Crisis Lifeline: 988 call the [place] National Suicide Prevention Lifeline: 747 692 6202 or TTY: 551 745 4879 TTY 228-408-7736) to talk to a trained counselor call 1-800-273-TALK (toll free, 24 hour hotline) if  experiencing a Mental Health or [place]  check blood pressure 3 times per week call doctor for signs and symptoms of high blood pressure develop an action plan for high blood pressure keep all doctor appointments take medications for blood pressure exactly as prescribed report new symptoms to your doctor Provided patient with basic written and verbal COPD education on self care/management/and exacerbation prevention Advised patient to track and manage COPD triggers Provided written and verbal instructions on pursed lip breathing and utilized returned demonstration as teach back Provided instruction about proper use of medications used for management of COPD including inhalers Advised patient to self assesses COPD action plan zone and make appointment with provider if in the yellow zone for 48 hours without improvement Advised patient to engage in light exercise as tolerated 3-5 days a week to aid in the the management of COPD Provided education about and advised patient to utilize infection prevention strategies to reduce risk of respiratory infection Discussed the importance of adequate rest and management of fatigue with COPD Screening for signs and symptoms of depression related to chronic disease state  Assessed social determinant of health barriers  Interaction and coordination with outside resources, practitioners, and providers See CCM Referral  Care Plan: Available in MyChart

## 2022-08-11 NOTE — Chronic Care Management (AMB) (Signed)
Chronic Care Management   CCM RN Visit Note  08/11/2022 Name: [name] MRN: [id number] DOB: [birth year]  Subjective: [name] is a 81 y.o. year old female who is a primary care patient of [name], NP. The patient was referred to the Chronic Care Management team for assistance with care management needs subsequent to provider initiation of CCM services and plan of care.    Today's Visit:  Engaged with patient by telephone for initial visit.     SDOH Interventions Today    Flowsheet Row Most Recent Value  SDOH Interventions   Food Insecurity Interventions Intervention Not Indicated  Housing Interventions Intervention Not Indicated  Transportation Interventions Intervention Not Indicated  Utilities Interventions Intervention Not Indicated  Alcohol Usage Interventions Intervention Not Indicated (Score <7)  Financial Strain Interventions Intervention Not Indicated  Stress Interventions Intervention Not Indicated  Social Connections Interventions Intervention Not Indicated         Goals Addressed             This Visit's Progress    CCM Expected Outcome:  Monitor, Self-Manage, and Reduce Symptoms of Hypertension       Current Barriers:  Knowledge Deficits related to the need to check her blood pressures on a regular basis to effectively manage HTN in the home setting Care Coordination needs related to ongoing support and education on monitoring changes in HTN or hearth health in a patient with HTN Chronic Disease Management support and education needs related to effective management of HTN Cognitive Deficits  Planned Interventions: Evaluation of current treatment plan related to hypertension self management and patient's adherence to plan as established by provider;   Provided education to patient re: stroke prevention, s/s of heart attack and stroke; Reviewed prescribed diet the patient reminded to follow a heart healthy diet for effective management  of her HTN and HLD Reviewed medications with patient and discussed importance of compliance. The patient states she is taking her medications as directed now. Was taking the wrong dose of cholesterol medications until speaking to the pharm D last week and the medications was ordered. The patient states she is taking as directed now. ;  Discussed plans with patient for ongoing care management follow up and provided patient with direct contact information for care management team; Advised patient, providing education and rationale, to monitor blood pressure daily and record, calling PCP for findings outside established parameters. The patient continues to not take her blood pressures at home. She states she is fine and knows when her blood pressure is up because she has a headache. Review of the benefits of checking blood pressures on a regular basis. ;  Reviewed scheduled/upcoming provider appointments including: Has follow up for post pain stimulator tomorrow. Encouraged the patient to call the office to get a follow up with the pcp  Advised patient to discuss changes in her HTN or heart health with provider; Provided education on prescribed diet heart healthy;  Discussed complications of poorly controlled blood pressure such as heart disease, stroke, circulatory complications, vision complications, kidney impairment, sexual dysfunction;  Screening for signs and symptoms of depression related to chronic disease state;  Assessed social determinant of health barriers;   Symptom Management: Take medications as prescribed   Attend all scheduled provider appointments Call provider office for new concerns or questions  call the Suicide and Crisis Lifeline: 988 call the [place] National Suicide Prevention Lifeline: (502)771-3238 or TTY: 941-141-5278 TTY 224-717-3496) to talk to a trained counselor call 1-800-273-TALK (  toll free, 24 hour hotline) if experiencing a Mental Health or [place]   check blood pressure 3 times per week call doctor for signs and symptoms of high blood pressure develop an action plan for high blood pressure keep all doctor appointments take medications for blood pressure exactly as prescribed report new symptoms to your doctor  Follow Up Plan: Telephone follow up appointment with care management team member scheduled for: 10-13-2022 at 0945 am       CCM:  Maintain, Monitor and Self-Manage Symptoms of COPD       Current Barriers:  Care Coordination needs related to ongoing support and education  in a patient with COPD Chronic Disease Management support and education needs related to the effective management of COPD Cognitive Deficits  Planned Interventions: Provided patient with basic written and verbal COPD education on self care/management/and exacerbation prevention Advised patient to track and manage COPD triggers Provided written and verbal instructions on pursed lip breathing and utilized returned demonstration as teach back Provided instruction about proper use of medications used for management of COPD including inhalers Advised patient to self assesses COPD action plan zone and make appointment with provider if in the yellow zone for 48 hours without improvement Advised patient to engage in light exercise as tolerated 3-5 days a week to aid in the the management of COPD Provided education about and advised patient to utilize infection prevention strategies to reduce risk of respiratory infection Discussed the importance of adequate rest and management of fatigue with COPD Screening for signs and symptoms of depression related to chronic disease state  Assessed social determinant of health barriers  Symptom Management: Take medications as prescribed   Attend all scheduled provider appointments Call provider office for new concerns or questions  call the Suicide and Crisis Lifeline: 988 call the [place] National Suicide Prevention Lifeline:  743-081-2831 or TTY: (603)775-7089 TTY 716-201-4565) to talk to a trained counselor call 1-800-273-TALK (toll free, 24 hour hotline) if experiencing a Mental Health or [place]  avoid second hand smoke eliminate smoking in my home identify and remove indoor air pollutants limit outdoor activity during cold weather listen for public air quality announcements every day do breathing exercises every day begin a symptom diary develop a rescue plan eliminate symptom triggers at home follow rescue plan if symptoms flare-up  Follow Up Plan: Telephone follow up appointment with care management team member scheduled for: 10-13-2022 at 0945 am       RNCM: Effective Management of HLD       Care Coordination Interventions:Closing this goal and opening in new CCM Lab Results  Component Value Date   CHOL 197 05/28/2022   HDL 46 (L) 05/28/2022   LDLCALC 116 (H) 05/28/2022   TRIG 248 (H) 05/28/2022   CHOLHDL 4.3 05/28/2022    Provider established cholesterol goals reviewed. Review and education. Let the patient know that she needs to call the office and get an appointment for follow up. Counseled on importance of regular laboratory monitoring as prescribed. Review of needing to have levels checked after change in medications Provided HLD educational materials Reviewed role and benefits of statin for ASCVD risk reduction Discussed strategies to manage statin-induced myalgias Reviewed importance of limiting foods high in cholesterol. Education and support given.  Screening for signs and symptoms of depression related to chronic disease state Assessed social determinant of health barriers            Plan:Telephone follow up appointment with care management team  member scheduled for:  10-13-2022 at [place] am  [place], MSN, CCM RN Care Manager  Chronic Care Management Direct Number: [phone]

## 2022-08-11 NOTE — Patient Instructions (Signed)
Please call the care guide team at [phone] if you need to cancel or reschedule your appointment.   If you are experiencing a Mental Health or [place] or need someone to talk to, please call the Suicide and Crisis Lifeline: 988 call the [place] National Suicide Prevention Lifeline: (517)551-7770 or TTY: (972) 658-9292 TTY (602)681-2671) to talk to a trained counselor call 1-800-273-TALK (toll free, 24 hour hotline)   Following is a copy of your full provider care plan:   Goals Addressed             This Visit's Progress    CCM Expected Outcome:  Monitor, Self-Manage, and Reduce Symptoms of Hypertension       Current Barriers:  Knowledge Deficits related to the need to check her blood pressures on a regular basis to effectively manage HTN in the home setting Care Coordination needs related to ongoing support and education on monitoring changes in HTN or hearth health in a patient with HTN Chronic Disease Management support and education needs related to effective management of HTN Cognitive Deficits  Planned Interventions: Evaluation of current treatment plan related to hypertension self management and patient's adherence to plan as established by provider;   Provided education to patient re: stroke prevention, s/s of heart attack and stroke; Reviewed prescribed diet the patient reminded to follow a heart healthy diet for effective management of her HTN and HLD Reviewed medications with patient and discussed importance of compliance. The patient states she is taking her medications as directed now. Was taking the wrong dose of cholesterol medications until speaking to the pharm D last week and the medications was ordered. The patient states she is taking as directed now. ;  Discussed plans with patient for ongoing care management follow up and provided patient with direct contact information for care management team; Advised patient, providing education and rationale, to  monitor blood pressure daily and record, calling PCP for findings outside established parameters. The patient continues to not take her blood pressures at home. She states she is fine and knows when her blood pressure is up because she has a headache. Review of the benefits of checking blood pressures on a regular basis. ;  Reviewed scheduled/upcoming provider appointments including: Has follow up for post pain stimulator tomorrow. Encouraged the patient to call the office to get a follow up with the pcp  Advised patient to discuss changes in her HTN or heart health with provider; Provided education on prescribed diet heart healthy;  Discussed complications of poorly controlled blood pressure such as heart disease, stroke, circulatory complications, vision complications, kidney impairment, sexual dysfunction;  Screening for signs and symptoms of depression related to chronic disease state;  Assessed social determinant of health barriers;   Symptom Management: Take medications as prescribed   Attend all scheduled provider appointments Call provider office for new concerns or questions  call the Suicide and Crisis Lifeline: 988 call the [place] National Suicide Prevention Lifeline: 828 580 7236 or TTY: 802-543-9630 TTY 803-577-4398) to talk to a trained counselor call 1-800-273-TALK (toll free, 24 hour hotline) if experiencing a Mental Health or [place]  check blood pressure 3 times per week call doctor for signs and symptoms of high blood pressure develop an action plan for high blood pressure keep all doctor appointments take medications for blood pressure exactly as prescribed report new symptoms to your doctor  Follow Up Plan: Telephone follow up appointment with care management team member scheduled for: 10-13-2022 at 0945 am  CCM:  Maintain, Monitor and Self-Manage Symptoms of COPD       Current Barriers:  Care Coordination needs related to ongoing support and  education  in a patient with COPD Chronic Disease Management support and education needs related to the effective management of COPD Cognitive Deficits  Planned Interventions: Provided patient with basic written and verbal COPD education on self care/management/and exacerbation prevention Advised patient to track and manage COPD triggers Provided written and verbal instructions on pursed lip breathing and utilized returned demonstration as teach back Provided instruction about proper use of medications used for management of COPD including inhalers Advised patient to self assesses COPD action plan zone and make appointment with provider if in the yellow zone for 48 hours without improvement Advised patient to engage in light exercise as tolerated 3-5 days a week to aid in the the management of COPD Provided education about and advised patient to utilize infection prevention strategies to reduce risk of respiratory infection Discussed the importance of adequate rest and management of fatigue with COPD Screening for signs and symptoms of depression related to chronic disease state  Assessed social determinant of health barriers  Symptom Management: Take medications as prescribed   Attend all scheduled provider appointments Call provider office for new concerns or questions  call the Suicide and Crisis Lifeline: 988 call the [place] National Suicide Prevention Lifeline: (604) 100-8584 or TTY: 986-162-2707 TTY 613 176 1532) to talk to a trained counselor call 1-800-273-TALK (toll free, 24 hour hotline) if experiencing a [place] or [place]  avoid second hand smoke eliminate smoking in my home identify and remove indoor air pollutants limit outdoor activity during cold weather listen for public air quality announcements every day do breathing exercises every day begin a symptom diary develop a rescue plan eliminate symptom triggers at home follow rescue plan if symptoms  flare-up  Follow Up Plan: Telephone follow up appointment with care management team member scheduled for: 10-13-2022 at 0945 am       RNCM: Effective Management of HLD       Care Coordination Interventions:Closing this goal and opening in new CCM Lab Results  Component Value Date   CHOL 197 05/28/2022   HDL 46 (L) 05/28/2022   LDLCALC 116 (H) 05/28/2022   TRIG 248 (H) 05/28/2022   CHOLHDL 4.3 05/28/2022    Provider established cholesterol goals reviewed. Review and education. Let the patient know that she needs to call the office and get an appointment for follow up. Counseled on importance of regular laboratory monitoring as prescribed. Review of needing to have levels checked after change in medications Provided HLD educational materials Reviewed role and benefits of statin for ASCVD risk reduction Discussed strategies to manage statin-induced myalgias Reviewed importance of limiting foods high in cholesterol. Education and support given.  Screening for signs and symptoms of depression related to chronic disease state Assessed social determinant of health barriers            Patient verbalizes understanding of instructions and care plan provided today and agrees to view in [place]. Active MyChart status and patient understanding of how to access instructions and care plan via MyChart confirmed with patient.     Telephone follow up appointment with care management team member scheduled for: 10-13-2022 at 0945 am

## 2022-08-12 ENCOUNTER — Encounter: Payer: Self-pay | Admitting: Orthopedic Surgery

## 2022-08-12 ENCOUNTER — Ambulatory Visit (INDEPENDENT_AMBULATORY_CARE_PROVIDER_SITE_OTHER): Payer: PPO | Admitting: Orthopedic Surgery

## 2022-08-12 VITALS — BP 134/71 | HR 57 | Temp 97.7°F

## 2022-08-12 DIAGNOSIS — T85192S Other mechanical complication of implanted electronic neurostimulator (electrode) of spinal cord, sequela: Secondary | ICD-10-CM

## 2022-08-12 DIAGNOSIS — T85192D Other mechanical complication of implanted electronic neurostimulator (electrode) of spinal cord, subsequent encounter: Secondary | ICD-10-CM

## 2022-08-12 DIAGNOSIS — Z09 Encounter for follow-up examination after completed treatment for conditions other than malignant neoplasm: Secondary | ICD-10-CM

## 2022-08-21 DIAGNOSIS — J449 Chronic obstructive pulmonary disease, unspecified: Secondary | ICD-10-CM | POA: Diagnosis not present

## 2022-08-21 DIAGNOSIS — I10 Essential (primary) hypertension: Secondary | ICD-10-CM

## 2022-08-21 DIAGNOSIS — E785 Hyperlipidemia, unspecified: Secondary | ICD-10-CM | POA: Diagnosis not present

## 2022-08-21 DIAGNOSIS — F17210 Nicotine dependence, cigarettes, uncomplicated: Secondary | ICD-10-CM

## 2022-08-27 ENCOUNTER — Encounter: Payer: Self-pay | Admitting: Internal Medicine

## 2022-08-27 ENCOUNTER — Ambulatory Visit (INDEPENDENT_AMBULATORY_CARE_PROVIDER_SITE_OTHER): Payer: PPO | Admitting: Internal Medicine

## 2022-08-27 VITALS — BP 136/80 | HR 60 | Temp 96.9°F | Wt 163.0 lb

## 2022-08-27 DIAGNOSIS — R070 Pain in throat: Secondary | ICD-10-CM

## 2022-08-27 NOTE — Progress Notes (Signed)
Subjective:    Patient ID: [name], female    DOB: March 08, 1941, 81 y.o.   MRN: [id number]  HPI  Patient presents to clinic today with complaint of discomfort in her throat.  This is an intermittent issue that has been going on for years.  She reports the area is tender to touch.  She does feel like she has some postnasal drip.  She denies hoarseness, cough, difficulty swallowing or shortness of breath.  She has not tried anything OTC for this.  She is concerned that it is her thyroid.  Review of Systems     Past Medical History:  Diagnosis Date   Allergy    Anemia    Anxiety    Arthritis    Cancer ([place])    skin cancer   Hyperlipidemia    Hypertension    Hypothyroidism    Sciatica    Sleep apnea    does not use cpap   Thrombocytopenia (HCC)    Thyroid disease     Current Outpatient Medications  Medication Sig Dispense Refill   acetaminophen (TYLENOL) 500 MG tablet Take 500 mg by mouth every 6 (six) hours as needed for moderate pain.      amLODipine (NORVASC) 10 MG tablet TAKE 1 TABLET(10 MG) BY MOUTH DAILY 90 tablet 1   atorvastatin (LIPITOR) 40 MG tablet Take 1 tablet (40 mg total) by mouth daily. (Patient taking differently: Take 20 mg by mouth daily.) 90 tablet 0   citalopram (CELEXA) 20 MG tablet TAKE 1 TABLET(20 MG) BY MOUTH DAILY 90 tablet 0   levothyroxine (SYNTHROID) 150 MCG tablet TAKE 1 TABLET(150 MCG) BY MOUTH DAILY 90 tablet 1   losartan (COZAAR) 100 MG tablet TAKE 1 TABLET(100 MG) BY MOUTH DAILY 90 tablet 1   metoprolol succinate (TOPROL-XL) 50 MG 24 hr tablet TAKE 1 TABLET BY MOUTH ONCE A DAY WITH OR IMMEDIATELY FOLLOWING A MEAL 90 tablet 1   Multiple Vitamin (MULTIVITAMIN) capsule Take 1 capsule by mouth daily.     rOPINIRole (REQUIP) 0.5 MG tablet TAKE 1 TABLET(0.5 MG) BY MOUTH AT BEDTIME 90 tablet 0   traZODone (DESYREL) 50 MG tablet TAKE 1 TABLET(50 MG) BY MOUTH AT BEDTIME AS NEEDED FOR SLEEP 90 tablet 0   No current facility-administered  medications for this visit.    Allergies  Allergen Reactions   Codeine Nausea Only    Tolerates oxycodone   Morphine Nausea And Vomiting   Ace Inhibitors Cough   Adhesive [Tape] Other (See Comments) and Rash    whelps    Family History  Problem Relation Age of Onset   Dementia Mother    Dementia Sister    Heart attack Brother    Diabetes Brother    Breast cancer Paternal Aunt    Heart attack Son    Hypercalcemia Neg Hx    Cancer Neg Hx     Social History   Socioeconomic History   Marital status: Married    Spouse name: ROBERT   Number of children: 4   Years of education: Not on file   Highest education level: Not on file  Occupational History   Occupation: retired   Occupation: Retired  Tobacco Use   Smoking status: Every Day    Packs/day: 0.50    Years: 40.00    Total pack years: 20.00    Types: Cigarettes   Smokeless tobacco: Never  Vaping Use   Vaping Use: Never used  Substance and Sexual Activity   Alcohol  use: No   Drug use: No   Sexual activity: Not Currently  Other Topics Concern   Not on file  Social History Narrative   Not on file   Social Determinants of Health   Financial Resource Strain: Low Risk  (08/11/2022)   Overall Financial Resource Strain (CARDIA)    Difficulty of Paying Living Expenses: Not hard at all  Food Insecurity: No Food Insecurity (08/11/2022)   Hunger Vital Sign    Worried About Running Out of Food in the Last Year: Never true    Ran Out of Food in the Last Year: Never true  Transportation Needs: No Transportation Needs (08/11/2022)   PRAPARE - Hydrologist (Medical): No    Lack of Transportation (Non-Medical): No  Physical Activity: Sufficiently Active (12/16/2021)   Exercise Vital Sign    Days of Exercise per Week: 5 days    Minutes of Exercise per Session: 30 min  Stress: No Stress Concern Present (08/11/2022)   [place]     Feeling of Stress : Not at all  Social Connections: Moderately Integrated (08/11/2022)   Social Connection and Isolation Panel [NHANES]    Frequency of Communication with Friends and Family: Once a week    Frequency of Social Gatherings with Friends and Family: Once a week    Attends Religious Services: More than 4 times per year    Active Member of [employer] or Organizations: Yes    Attends Music therapist: More than 4 times per year    Marital Status: Married  Human resources officer Violence: Not At Risk (08/11/2022)   Humiliation, Afraid, Rape, and Kick questionnaire    Fear of Current or Ex-Partner: No    Emotionally Abused: No    Physically Abused: No    Sexually Abused: No     Constitutional: Denies fever, malaise, fatigue, headache or abrupt weight changes.  HEENT: Patient reports sore throat.  Denies eye pain, eye redness, ear pain,  wax buildup, runny nose, nasal congestion, bloody nose. Respiratory: Denies difficulty breathing, shortness of breath, cough or sputum production.   Cardiovascular: Denies chest pain, chest tightness, palpitations or swelling in the hands or feet.  Gastrointestinal: Denies abdominal pain, bloating, constipation, diarrhea or blood in the stool.   No other specific complaints in a complete review of systems (except as listed in HPI above).  Objective:   Physical Exam  BP 136/80 (BP Location: Right Arm, Patient Position: Sitting, Cuff Size: Normal)   Pulse 60   Temp (!) 96.9 F (36.1 C) (Temporal)   Wt 163 lb (73.9 kg)   LMP  (LMP Unknown)   SpO2 100%   BMI 30.30 kg/m   Wt Readings from Last 3 Encounters:  07/18/22 165 lb 5.5 oz (75 kg)  07/08/22 164 lb (74.4 kg)  06/23/22 167 lb (75.8 kg)    General: Appears her stated age, obese, in NAD. Skin: Warm, dry and intact.  HEENT: Head: normal shape and size; Throat/Mouth: Teeth present, + PND, mucosa pink and moist, no exudate, lesions or ulcerations noted.  Neck:  Neck supple,  trachea midline. No masses, lumps or thyromegaly present.  Cardiovascular: Normal rate and rhythm.  Pulmonary/Chest: Normal effort and positive vesicular breath sounds. No respiratory distress. No wheezes, rales or ronchi noted.  Neurological: Alert and oriented.    BMET    Component Value Date/Time   NA 143 07/18/2022 0928   NA 145 (H) 12/21/2017 1017  NA 139 08/26/2012 2035   K 3.9 07/18/2022 0928   K 3.3 (L) 08/26/2012 2035   CL 109 07/18/2022 0928   CL 105 08/26/2012 2035   CO2 26 07/18/2022 0928   CO2 29 08/26/2012 2035   GLUCOSE 88 07/18/2022 0928   GLUCOSE 97 08/26/2012 2035   BUN 12 07/18/2022 0928   BUN 13 12/21/2017 1017   BUN 13 08/26/2012 2035   CREATININE 0.74 07/18/2022 0928   CREATININE 0.86 07/29/2021 1359   CALCIUM 9.9 07/18/2022 0928   CALCIUM 9.9 08/26/2012 2035   GFRNONAA >60 07/18/2022 0928   GFRNONAA 66 09/11/2020 1147   GFRAA 76 09/11/2020 1147    Lipid Panel     Component Value Date/Time   CHOL 197 05/28/2022 0849   CHOL 181 12/21/2017 1017   TRIG 248 (H) 05/28/2022 0849   HDL 46 (L) 05/28/2022 0849   HDL 50 12/21/2017 1017   CHOLHDL 4.3 05/28/2022 0849   LDLCALC 116 (H) 05/28/2022 0849    CBC    Component Value Date/Time   WBC 7.2 07/18/2022 0928   RBC 4.61 07/18/2022 0928   HGB 15.3 (H) 07/18/2022 0928   HGB 14.8 12/21/2017 1017   HCT 45.9 07/18/2022 0928   HCT 42.5 12/21/2017 1017   PLT 126 (L) 07/18/2022 0928   PLT 141 (L) 12/21/2017 1017   MCV 99.6 07/18/2022 0928   MCV 93 12/21/2017 1017   MCV 92 08/26/2012 2035   MCH 33.2 07/18/2022 0928   MCHC 33.3 07/18/2022 0928   RDW 12.9 07/18/2022 0928   RDW 12.8 12/21/2017 1017   RDW 13.1 08/26/2012 2035   LYMPHSABS 2.8 02/11/2022 1307   LYMPHSABS 2.5 12/21/2017 1017   MONOABS 0.8 02/11/2022 1307   EOSABS 0.1 02/11/2022 1307   EOSABS 0.3 12/21/2017 1017   BASOSABS 0.1 02/11/2022 1307   BASOSABS 0.0 12/21/2017 1017    Hgb A1C Lab Results  Component Value Date   HGBA1C 5.3  05/08/2021           Assessment & Plan:   Throat Discomfort:  This has been a chronic issue for 2 years that I have known her She is always concerned that it is her thyroid however she has no thyromegaly, nodules and TSH has been normal We will have her start Claritin 10 mg daily for postnasal drip to see if this helps her symptoms Discussed that the fact that she smokes, there could be some concern for throat cancer.  Recommended referral to ENT but she declines at this time    RTC in 1 month for follow-up of chronic conditions [name], NP

## 2022-08-30 ENCOUNTER — Other Ambulatory Visit: Payer: Self-pay | Admitting: Internal Medicine

## 2022-09-01 NOTE — Telephone Encounter (Signed)
Unable to refill per protocol, Rx medication was discontinued 06/02/22 by PCP. Will refuse.  Requested Prescriptions  Pending Prescriptions Disp Refills   atorvastatin (LIPITOR) 20 MG tablet [Pharmacy Med Name: ATORVASTATIN '20MG'$  TABLETS] 90 tablet 0    Sig: TAKE 1 TABLET(20 MG) BY MOUTH DAILY     Cardiovascular:  Antilipid - Statins Failed - 08/30/2022  8:36 AM      Failed - Lipid Panel in normal range within the last 12 months    Cholesterol, Total  Date Value Ref Range Status  12/21/2017 181 100 - 199 mg/dL Final   Cholesterol  Date Value Ref Range Status  05/28/2022 197 <200 mg/dL Final   LDL Cholesterol (Calc)  Date Value Ref Range Status  05/28/2022 116 (H) mg/dL (calc) Final    Comment:    Reference range: <100 . Desirable range <100 mg/dL for primary prevention;   <70 mg/dL for patients with CHD or diabetic patients  with > or = 2 CHD risk factors. Marland Kitchen LDL-C is now calculated using the Martin-Hopkins  calculation, which is a validated novel method providing  better accuracy than the Friedewald equation in the  estimation of LDL-C.  Cresenciano Genre et al. Annamaria Helling. 4982;641(58): 2061-2068  (http://education.[facility].com/faq/FAQ164)    HDL  Date Value Ref Range Status  05/28/2022 46 (L) > OR = 50 mg/dL Final  12/21/2017 50 >39 mg/dL Final   Triglycerides  Date Value Ref Range Status  05/28/2022 248 (H) <150 mg/dL Final    Comment:    . If a non-fasting specimen was collected, consider repeat triglyceride testing on a fasting specimen if clinically indicated.  Yates Decamp et al. J. of Clin. Lipidol. 3094;0:768-088. Marland Kitchen          Passed - Patient is not pregnant      Passed - Valid encounter within last 12 months    Recent Outpatient Visits           5 days ago Throat discomfort   [facility] [place], Coralie Keens, NP   2 months ago [facility] [place], Coralie Keens, NP   5 months ago Chronic right ear pain   [place], DO   6 months ago Encounter for general adult medical examination with abnormal findings   [facility], Coralie Keens, NP   9 months ago Primary hypertension   [facility] [place], Coralie Keens, NP       Future Appointments             In 3 weeks Garnette Gunner, Coralie Keens, NP [facility], [facility]

## 2022-09-09 ENCOUNTER — Ambulatory Visit (INDEPENDENT_AMBULATORY_CARE_PROVIDER_SITE_OTHER): Payer: PPO | Admitting: Neurosurgery

## 2022-09-09 VITALS — BP 165/76 | HR 64 | Temp 97.7°F | Wt 166.2 lb

## 2022-09-09 DIAGNOSIS — T85192D Other mechanical complication of implanted electronic neurostimulator (electrode) of spinal cord, subsequent encounter: Secondary | ICD-10-CM | POA: Diagnosis not present

## 2022-09-09 DIAGNOSIS — T85192S Other mechanical complication of implanted electronic neurostimulator (electrode) of spinal cord, sequela: Secondary | ICD-10-CM

## 2022-09-09 NOTE — Progress Notes (Signed)
   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  She is doing okay.  She has no pain in her back.  She does have some issues with extending her left thumb.      PHYSICAL EXAMINATION:  General: Patient is well developed, well nourished, calm, collected, and in no apparent distress.   NEUROLOGICAL:  General: In no acute distress.   Awake, alert, oriented to person, place, and time.  Pupils equal round and reactive to light.  Facial tone is symmetric.     Strength:            Side Iliopsoas Quads Hamstring PF DF EHL  R '5 5 5 5 5 5  '$ L '5 5 5 5 5 5   '$ Incisions c/d/I  She cannot make thumbs up with left thumb.  ROS (Neurologic):  Negative except as noted above  IMAGING: Nothing new to review.   ASSESSMENT/PLAN:  [name] is doing well s/p above surgery.  Her incisions are well-healed.  I recommended she see an orthopedic surgeon.  She declined.  I will see her back as needed. [facility] Department of neurosurgery

## 2022-09-26 ENCOUNTER — Ambulatory Visit (INDEPENDENT_AMBULATORY_CARE_PROVIDER_SITE_OTHER): Payer: PPO | Admitting: Internal Medicine

## 2022-09-26 ENCOUNTER — Encounter: Payer: Self-pay | Admitting: Internal Medicine

## 2022-09-26 VITALS — BP 128/66 | HR 57 | Temp 96.9°F | Wt 167.0 lb

## 2022-09-26 DIAGNOSIS — I700 Atherosclerosis of aorta: Secondary | ICD-10-CM

## 2022-09-26 DIAGNOSIS — E6609 Other obesity due to excess calories: Secondary | ICD-10-CM

## 2022-09-26 DIAGNOSIS — E213 Hyperparathyroidism, unspecified: Secondary | ICD-10-CM | POA: Diagnosis not present

## 2022-09-26 DIAGNOSIS — D696 Thrombocytopenia, unspecified: Secondary | ICD-10-CM | POA: Diagnosis not present

## 2022-09-26 DIAGNOSIS — I10 Essential (primary) hypertension: Secondary | ICD-10-CM

## 2022-09-26 DIAGNOSIS — E7800 Pure hypercholesterolemia, unspecified: Secondary | ICD-10-CM | POA: Diagnosis not present

## 2022-09-26 DIAGNOSIS — G2581 Restless legs syndrome: Secondary | ICD-10-CM

## 2022-09-26 DIAGNOSIS — F419 Anxiety disorder, unspecified: Secondary | ICD-10-CM

## 2022-09-26 DIAGNOSIS — R4189 Other symptoms and signs involving cognitive functions and awareness: Secondary | ICD-10-CM

## 2022-09-26 DIAGNOSIS — Z6831 Body mass index (BMI) 31.0-31.9, adult: Secondary | ICD-10-CM

## 2022-09-26 DIAGNOSIS — M85852 Other specified disorders of bone density and structure, left thigh: Secondary | ICD-10-CM | POA: Diagnosis not present

## 2022-09-26 DIAGNOSIS — M19011 Primary osteoarthritis, right shoulder: Secondary | ICD-10-CM | POA: Diagnosis not present

## 2022-09-26 DIAGNOSIS — F5104 Psychophysiologic insomnia: Secondary | ICD-10-CM

## 2022-09-26 DIAGNOSIS — M19012 Primary osteoarthritis, left shoulder: Secondary | ICD-10-CM

## 2022-09-26 DIAGNOSIS — R7309 Other abnormal glucose: Secondary | ICD-10-CM

## 2022-09-26 DIAGNOSIS — E039 Hypothyroidism, unspecified: Secondary | ICD-10-CM | POA: Diagnosis not present

## 2022-09-26 DIAGNOSIS — J4489 Other specified chronic obstructive pulmonary disease: Secondary | ICD-10-CM | POA: Diagnosis not present

## 2022-09-26 MED ORDER — ROPINIROLE HCL 0.5 MG PO TABS: ORAL_TABLET | ORAL | 1 refills | Status: DC

## 2022-09-26 MED ORDER — ATORVASTATIN CALCIUM 20 MG PO TABS: 20.0000 mg | ORAL_TABLET | Freq: Every day | ORAL | 1 refills | Status: DC

## 2022-09-26 NOTE — Assessment & Plan Note (Signed)
TSH and free T4 today We will adjust levothyroxine if needed based on labs 

## 2022-09-26 NOTE — Progress Notes (Signed)
Subjective:    Patient ID: [name], female    DOB: 1941/06/15, 82 y.o.   MRN: [id number]  HPI  Patient presents to clinic today for follow-up of chronic conditions.  HTN: Her BP today is 128/66.  She is taking Amlodipine, Losartan and Metoprolol as prescribed.  ECG from 11/2023reviewed.  HLD with Aortic Atheroscleorosis: Her last LDL was 116, triglycerides 248, 05/2022.  She denies myalgias on Atorvastatin. She is taking Aspirin as well.  She tries to consume a low-fat diet.  Hypothyroidism: She is taking Levothyroxine as prescribed.  She does not follow with endocrinology.  Hyperparathyroidism: Her last calcium was 9.9, 06/2022.  She is status post parathyroidectomy.  She does not follow with endocrinology.  COPD: She denies chronic cough or shortness of breath.  She is not using any inhalers at this time.  There are no PFTs on file.  She does not smoke.  She does not follow with pulmonology.  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  She takes Tylenol as needed with good relief of symptoms.  Osteopenia: She is taking Calcium and Vitamin D daily.  She tries to get weightbearing exercise and daily.  Bone density from 12/2021 reviewed.  Anxiety: Chronic, managed on Citalopram.  She is not currently seeing a therapist.  She denies depression, SI/HI.  Insomnia: She has difficulty falling asleep and staying asleep.  She is taking Trazodone as prescribed with good relief of symptoms.  There is no sleep study on file.  RLS: Managed on Ropinirole.  She does not follow with neurology.  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  She is not currently taking any medications for this.  She does not follow with neurology.  Thrombocytopenia: Her last platelet count was 126, 06/2022.  She does not follow with hematology.  Review of Systems     Past Medical History:  Diagnosis Date   Allergy    Anemia    Anxiety    Arthritis    Cancer ([place])    skin cancer   Hyperlipidemia     Hypertension    Hypothyroidism    Sciatica    Sleep apnea    does not use cpap   Thrombocytopenia (HCC)    Thyroid disease     Current Outpatient Medications  Medication Sig Dispense Refill   acetaminophen (TYLENOL) 500 MG tablet Take 500 mg by mouth every 6 (six) hours as needed for moderate pain.      amLODipine (NORVASC) 10 MG tablet TAKE 1 TABLET(10 MG) BY MOUTH DAILY 90 tablet 1   atorvastatin (LIPITOR) 40 MG tablet Take 1 tablet (40 mg total) by mouth daily. (Patient taking differently: Take 20 mg by mouth daily.) 90 tablet 0   citalopram (CELEXA) 20 MG tablet TAKE 1 TABLET(20 MG) BY MOUTH DAILY 90 tablet 0   levothyroxine (SYNTHROID) 150 MCG tablet TAKE 1 TABLET(150 MCG) BY MOUTH DAILY 90 tablet 1   losartan (COZAAR) 100 MG tablet TAKE 1 TABLET(100 MG) BY MOUTH DAILY 90 tablet 1   metoprolol succinate (TOPROL-XL) 50 MG 24 hr tablet TAKE 1 TABLET BY MOUTH ONCE A DAY WITH OR IMMEDIATELY FOLLOWING A MEAL 90 tablet 1   Multiple Vitamin (MULTIVITAMIN) capsule Take 1 capsule by mouth daily.     rOPINIRole (REQUIP) 0.5 MG tablet TAKE 1 TABLET(0.5 MG) BY MOUTH AT BEDTIME 90 tablet 0   traZODone (DESYREL) 50 MG tablet TAKE 1 TABLET(50 MG) BY MOUTH AT BEDTIME AS NEEDED FOR SLEEP 90 tablet 0  No current facility-administered medications for this visit.    Allergies  Allergen Reactions   Codeine Nausea Only    Tolerates oxycodone   Morphine Nausea And Vomiting   Ace Inhibitors Cough   Adhesive [Tape] Other (See Comments) and Rash    whelps    Family History  Problem Relation Age of Onset   Dementia Mother    Dementia Sister    Heart attack Brother    Diabetes Brother    Breast cancer Paternal Aunt    Heart attack Son    Hypercalcemia Neg Hx    Cancer Neg Hx     Social History   Socioeconomic History   Marital status: Married    Spouse name: ROBERT   Number of children: 4   Years of education: Not on file   Highest education level: Not on file  Occupational History    Occupation: retired   Occupation: Retired  Tobacco Use   Smoking status: Every Day    Packs/day: 0.50    Years: 40.00    Total pack years: 20.00    Types: Cigarettes   Smokeless tobacco: Never  Vaping Use   Vaping Use: Never used  Substance and Sexual Activity   Alcohol use: No   Drug use: No   Sexual activity: Not Currently  Other Topics Concern   Not on file  Social History Narrative   Not on file   Social Determinants of Health   Financial Resource Strain: Low Risk  (08/11/2022)   Overall Financial Resource Strain (CARDIA)    Difficulty of Paying Living Expenses: Not hard at all  Food Insecurity: No Food Insecurity (08/11/2022)   Hunger Vital Sign    Worried About Running Out of Food in the Last Year: Never true    [place] in the Last Year: Never true  Transportation Needs: No Transportation Needs (08/11/2022)   PRAPARE - Hydrologist (Medical): No    Lack of Transportation (Non-Medical): No  Physical Activity: Sufficiently Active (12/16/2021)   Exercise Vital Sign    Days of Exercise per Week: 5 days    Minutes of Exercise per Session: 30 min  Stress: No Stress Concern Present (08/11/2022)   [place]    Feeling of Stress : Not at all  Social Connections: Moderately Integrated (08/11/2022)   Social Connection and Isolation Panel [NHANES]    Frequency of Communication with Friends and Family: Once a week    Frequency of Social Gatherings with Friends and Family: Once a week    Attends Religious Services: More than 4 times per year    Active Member of [employer] or Organizations: Yes    Attends Music therapist: More than 4 times per year    Marital Status: Married  Human resources officer Violence: Not At Risk (08/11/2022)   Humiliation, Afraid, Rape, and Kick questionnaire    Fear of Current or Ex-Partner: No    Emotionally Abused: No    Physically Abused:  No    Sexually Abused: No     Constitutional: Denies fever, malaise, fatigue, headache or abrupt weight changes.  HEENT: Denies eye pain, eye redness, ear pain, ringing in the ears, wax buildup, runny nose, nasal congestion, bloody nose, or sore throat. Respiratory: Denies difficulty breathing, shortness of breath, cough or sputum production.   Cardiovascular: Denies chest pain, chest tightness, palpitations or swelling in the hands or feet.  Gastrointestinal: Denies abdominal  pain, bloating, constipation, diarrhea or blood in the stool.  GU: Denies urgency, frequency, pain with urination, burning sensation, blood in urine, odor or discharge. Musculoskeletal: Patient reports joint pain.  Denies decrease in range of motion, difficulty with gait, muscle pain or joint swelling.  Skin: Denies redness, rashes, lesions or ulcercations.  Neurological: Patient reports insomnia, restless legs and difficulty with memory. Denies dizziness, difficulty with speech or problems with balance and coordination.  Psych: Patient has a history of anxiety.  Denies depression, SI/HI.  No other specific complaints in a complete review of systems (except as listed in HPI above).  Objective:   Physical Exam  BP 128/66 (BP Location: Right Arm, Patient Position: Sitting, Cuff Size: Normal)   Pulse (!) 57   Temp (!) 96.9 F (36.1 C) (Temporal)   Wt 167 lb (75.8 kg)   LMP  (LMP Unknown)   SpO2 99%   BMI 31.04 kg/m   Wt Readings from Last 3 Encounters:  09/09/22 166 lb 3.2 oz (75.4 kg)  08/27/22 163 lb (73.9 kg)  07/18/22 165 lb 5.5 oz (75 kg)    General: Appears her stated age, obese, in NAD. Skin: Warm, dry and intact. HEENT: Head: normal shape and size; Eyes: sclera white, no icterus, conjunctiva pink, PERRLA and EOMs intact;  Neck:  Neck supple, trachea midline. No masses, lumps or thyromegaly present.  Cardiovascular: Bradycardic with normal rhythm. S1,S2 noted.  No murmur, rubs or gallops noted. No  JVD or BLE edema. No carotid bruits noted. Pulmonary/Chest: Normal effort and coarse breath sounds. No respiratory distress. No wheezes, rales or ronchi noted.  Abdomen: Normal bowel sounds.  Musculoskeletal: Shuffling gait.  Neurological: Alert and oriented. Trouble with recall. Repeats herself often. Coordination normal.  Psychiatric: Mood and affect normal. Behavior is normal. Judgment and thought content normal.     BMET    Component Value Date/Time   NA 143 07/18/2022 0928   NA 145 (H) 12/21/2017 1017   NA 139 08/26/2012 2035   K 3.9 07/18/2022 0928   K 3.3 (L) 08/26/2012 2035   CL 109 07/18/2022 0928   CL 105 08/26/2012 2035   CO2 26 07/18/2022 0928   CO2 29 08/26/2012 2035   GLUCOSE 88 07/18/2022 0928   GLUCOSE 97 08/26/2012 2035   BUN 12 07/18/2022 0928   BUN 13 12/21/2017 1017   BUN 13 08/26/2012 2035   CREATININE 0.74 07/18/2022 0928   CREATININE 0.86 07/29/2021 1359   CALCIUM 9.9 07/18/2022 0928   CALCIUM 9.9 08/26/2012 2035   GFRNONAA >60 07/18/2022 0928   GFRNONAA 66 09/11/2020 1147   GFRAA 76 09/11/2020 1147    Lipid Panel     Component Value Date/Time   CHOL 197 05/28/2022 0849   CHOL 181 12/21/2017 1017   TRIG 248 (H) 05/28/2022 0849   HDL 46 (L) 05/28/2022 0849   HDL 50 12/21/2017 1017   CHOLHDL 4.3 05/28/2022 0849   LDLCALC 116 (H) 05/28/2022 0849    CBC    Component Value Date/Time   WBC 7.2 07/18/2022 0928   RBC 4.61 07/18/2022 0928   HGB 15.3 (H) 07/18/2022 0928   HGB 14.8 12/21/2017 1017   HCT 45.9 07/18/2022 0928   HCT 42.5 12/21/2017 1017   PLT 126 (L) 07/18/2022 0928   PLT 141 (L) 12/21/2017 1017   MCV 99.6 07/18/2022 0928   MCV 93 12/21/2017 1017   MCV 92 08/26/2012 2035   MCH 33.2 07/18/2022 0928   MCHC 33.3 07/18/2022 0928  RDW 12.9 07/18/2022 0928   RDW 12.8 12/21/2017 1017   RDW 13.1 08/26/2012 2035   LYMPHSABS 2.8 02/11/2022 1307   LYMPHSABS 2.5 12/21/2017 1017   MONOABS 0.8 02/11/2022 1307   EOSABS 0.1 02/11/2022  1307   EOSABS 0.3 12/21/2017 1017   BASOSABS 0.1 02/11/2022 1307   BASOSABS 0.0 12/21/2017 1017    Hgb A1C Lab Results  Component Value Date   HGBA1C 5.3 05/08/2021            Assessment & Plan:     RTC in 6 months for your annual exam [name], NP

## 2022-09-26 NOTE — Assessment & Plan Note (Signed)
Continue ropinirole 

## 2022-09-26 NOTE — Assessment & Plan Note (Signed)
CMET and lipid profile today Encouraged her to consume a low fat diet Continue atorvastatin and aspirin

## 2022-09-26 NOTE — Assessment & Plan Note (Signed)
CBC today.  

## 2022-09-26 NOTE — Assessment & Plan Note (Signed)
She declines use of inhalers at this time Will monitor

## 2022-09-26 NOTE — Assessment & Plan Note (Signed)
Encourage diet and exercise for weight loss 

## 2022-09-26 NOTE — Assessment & Plan Note (Signed)
C-Met and lipid profile today Encouraged her to consume low-fat diet Continue atorvastatin 

## 2022-09-26 NOTE — Assessment & Plan Note (Signed)
C-Met today 

## 2022-09-26 NOTE — Assessment & Plan Note (Signed)
Continue Tylenol as needed 

## 2022-09-26 NOTE — Assessment & Plan Note (Signed)
Controlled on amlodipine, losartan and metoprolol CMET today

## 2022-09-26 NOTE — Assessment & Plan Note (Signed)
Continue calcium and vitamin D Encourage daily weightbearing exercise 

## 2022-09-26 NOTE — Assessment & Plan Note (Signed)
Continue citalopram Support offered 

## 2022-09-26 NOTE — Assessment & Plan Note (Signed)
MRI brain has been ordered but she declines getting this at this time

## 2022-09-26 NOTE — Patient Instructions (Signed)

## 2022-09-26 NOTE — Assessment & Plan Note (Signed)
Continue trazodone 

## 2022-09-27 LAB — CBC
HCT: 43.4 % (ref 35.0–45.0)
Hemoglobin: 15.1 g/dL (ref 11.7–15.5)
MCH: 34.2 pg — ABNORMAL HIGH (ref 27.0–33.0)
MCHC: 34.8 g/dL (ref 32.0–36.0)
MCV: 98.2 fL (ref 80.0–100.0)
MPV: 12.9 fL — ABNORMAL HIGH (ref 7.5–12.5)
Platelets: 120 10*3/uL — ABNORMAL LOW (ref 140–400)
RBC: 4.42 10*6/uL (ref 3.80–5.10)
RDW: 12.5 % (ref 11.0–15.0)
WBC: 6.8 10*3/uL (ref 3.8–10.8)

## 2022-09-27 LAB — TSH: TSH: 8.84 mIU/L — ABNORMAL HIGH (ref 0.40–4.50)

## 2022-09-27 LAB — LIPID PANEL
Cholesterol: 200 mg/dL — ABNORMAL HIGH (ref <200)
HDL: 50 mg/dL (ref >=50)
LDL Cholesterol (Calc): 108 mg/dL (calc) — ABNORMAL HIGH
Non-HDL Cholesterol (Calc): 150 mg/dL (calc) — ABNORMAL HIGH (ref <130)
Total CHOL/HDL Ratio: 4 (calc) (ref <5.0)
Triglycerides: 292 mg/dL — ABNORMAL HIGH (ref <150)

## 2022-09-27 LAB — HEMOGLOBIN A1C
Hgb A1c MFr Bld: 5.6 % of total Hgb (ref <5.7)
Mean Plasma Glucose: 114 mg/dL
eAG (mmol/L): 6.3 mmol/L

## 2022-09-27 LAB — COMPLETE METABOLIC PANEL WITH GFR
AG Ratio: 1.7 (calc) (ref 1.0–2.5)
ALT: 9 U/L (ref 6–29)
AST: 15 U/L (ref 10–35)
Albumin: 4.4 g/dL (ref 3.6–5.1)
Alkaline phosphatase (APISO): 55 U/L (ref 37–153)
BUN: 13 mg/dL (ref 7–25)
CO2: 26 mmol/L (ref 20–32)
Calcium: 10.1 mg/dL (ref 8.6–10.4)
Chloride: 107 mmol/L (ref 98–110)
Creat: 0.83 mg/dL (ref 0.60–0.95)
Globulin: 2.6 g/dL (calc) (ref 1.9–3.7)
Glucose, Bld: 105 mg/dL — ABNORMAL HIGH (ref 65–99)
Potassium: 4 mmol/L (ref 3.5–5.3)
Sodium: 143 mmol/L (ref 135–146)
Total Bilirubin: 0.7 mg/dL (ref 0.2–1.2)
Total Protein: 7 g/dL (ref 6.1–8.1)
eGFR: 71 mL/min/{1.73_m2} (ref >=60)

## 2022-09-27 LAB — T4, FREE: Free T4: 1.1 ng/dL (ref 0.8–1.8)

## 2022-10-10 ENCOUNTER — Other Ambulatory Visit: Payer: Self-pay | Admitting: Internal Medicine

## 2022-10-10 DIAGNOSIS — F5104 Psychophysiologic insomnia: Secondary | ICD-10-CM

## 2022-10-13 ENCOUNTER — Telehealth: Payer: PPO

## 2022-10-13 ENCOUNTER — Ambulatory Visit (INDEPENDENT_AMBULATORY_CARE_PROVIDER_SITE_OTHER): Payer: PPO

## 2022-10-13 DIAGNOSIS — I10 Essential (primary) hypertension: Secondary | ICD-10-CM

## 2022-10-13 DIAGNOSIS — J4489 Other specified chronic obstructive pulmonary disease: Secondary | ICD-10-CM

## 2022-10-13 NOTE — Chronic Care Management (AMB) (Signed)
Chronic Care Management   CCM RN Visit Note  10/13/2022 Name: [name] MRN: [id number] DOB: [birth year]  Subjective: [name] is a 82 y.o. year old female who is a primary care patient of [name], NP. The patient was referred to the Chronic Care Management team for assistance with care management needs subsequent to provider initiation of CCM services and plan of care.    Today's Visit:  Engaged with patient by telephone for follow up visit.        Goals Addressed             This Visit's Progress    CCM Expected Outcome:  Monitor, Self-Manage, and Reduce Symptoms of Hypertension       Current Barriers:  Knowledge Deficits related to the need to check her blood pressures on a regular basis to effectively manage HTN in the home setting Care Coordination needs related to ongoing support and education on monitoring changes in HTN or hearth health in a patient with HTN Chronic Disease Management support and education needs related to effective management of HTN Cognitive Deficits BP Readings from Last 3 Encounters:  09/26/22 128/66  09/09/22 (!) 165/76  08/27/22 136/80     Planned Interventions: Evaluation of current treatment plan related to hypertension self management and patient's adherence to plan as established by provider. The patient saw the pcp on 09-26-2022. Denies any acute changes in HTN or heart health. States she feels like she is doing well. Knows to call the provider for changes or new needs. ;   Provided education to patient re: stroke prevention, s/s of heart attack and stroke; Reviewed prescribed diet the patient reminded to follow a heart healthy diet for effective management of her HTN and HLD. The patient states she is eating well, sometimes too much. The patient states that she is monitoring her sodium content and mindful of foods high in sodium and fats.  Reviewed medications with patient and discussed importance of compliance. The  patient verbalized compliance with medications. Works with the [place] D on a regular basis for ongoing support and education. Reminder of appointment coming up in February to speak with the pharm D.  Discussed plans with patient for ongoing care management follow up and provided patient with direct contact information for care management team; Advised patient, providing education and rationale, to monitor blood pressure daily and record, calling PCP for findings outside established parameters. The patient continues to not take her blood pressures at home. She states she is fine and knows when her blood pressure is up because she has a headache. Review of the benefits of checking blood pressures on a regular basis. ;  Reviewed scheduled/upcoming provider appointments including: Saw the pcp on 09-26-2022. Next appointment on 03-27-2023 unless the patient needs to be seen sooner. Knows to call for changes or needs.  Advised patient to discuss changes in her HTN or heart health with provider; Provided education on prescribed diet heart healthy;  Discussed complications of poorly controlled blood pressure such as heart disease, stroke, circulatory complications, vision complications, kidney impairment, sexual dysfunction;  Screening for signs and symptoms of depression related to chronic disease state;  Assessed social determinant of health barriers;   Symptom Management: Take medications as prescribed   Attend all scheduled provider appointments Call provider office for new concerns or questions  call the Suicide and Crisis Lifeline: 988 call the [place] National Suicide Prevention Lifeline: (240)291-6687 or TTY: 207 432 4413 TTY 272-769-8328) to talk to a trained  counselor call 1-800-273-TALK (toll free, 24 hour hotline) if experiencing a Mental Health or [place]  check blood pressure 3 times per week call doctor for signs and symptoms of high blood pressure develop an action plan for high  blood pressure keep all doctor appointments take medications for blood pressure exactly as prescribed report new symptoms to your doctor  Follow Up Plan: Telephone follow up appointment with care management team member scheduled for: 12-15-2022 at 0945 am       CCM:  Maintain, Monitor and Self-Manage Symptoms of COPD       Current Barriers:  Care Coordination needs related to ongoing support and education  in a patient with COPD Chronic Disease Management support and education needs related to the effective management of COPD Cognitive Deficits  Planned Interventions: Provided patient with basic written and verbal COPD education on self care/management/and exacerbation prevention. The patient states she is doing well. Denies any new changes in her breathing. She continues to smoke. Is not interested in smoking cessation. Sees provider on a regular basis.  Advised patient to track and manage COPD triggers. Review of triggers that can cause exacerbation. The patient stays in when it is cold outside. She knows to be mindful of others who may be sick. States that her and her husband had a quiet Christmas holiday at home. Denies any issues with COPD triggers, high incidence of exacerbations Provided written and verbal instructions on pursed lip breathing and utilized returned demonstration as teach back Provided instruction about proper use of medications used for management of COPD including inhalers Advised patient to self assesses COPD action plan zone and make appointment with provider if in the yellow zone for 48 hours without improvement Advised patient to engage in light exercise as tolerated 3-5 days a week to aid in the the management of COPD Provided education about and advised patient to utilize infection prevention strategies to reduce risk of respiratory infection. Review of being safe and monitoring for others who may have upper respiratory infections and to be mindful of the risk of  infection and protecting self. The patient denies any acute changes in her COPD.  Discussed the importance of adequate rest and management of fatigue with COPD Screening for signs and symptoms of depression related to chronic disease state  Assessed social determinant of health barriers  Symptom Management: Take medications as prescribed   Attend all scheduled provider appointments Call provider office for new concerns or questions  call the Suicide and Crisis Lifeline: 988 call the [place] National Suicide Prevention Lifeline: 978-591-1270 or TTY: 609-623-2451 TTY 214-380-5538) to talk to a trained counselor call 1-800-273-TALK (toll free, 24 hour hotline) if experiencing a Mental Health or [place]  avoid second hand smoke eliminate smoking in my home identify and remove indoor air pollutants limit outdoor activity during cold weather listen for public air quality announcements every day do breathing exercises every day begin a symptom diary develop a rescue plan eliminate symptom triggers at home follow rescue plan if symptoms flare-up  Follow Up Plan: Telephone follow up appointment with care management team member scheduled for: 12-15-2022 at 0945 am       COMPLETED: RNCM: Effective Management of HLD       Care Coordination Interventions:Closing this goal and opening in new CCM Lab Results  Component Value Date   CHOL 197 05/28/2022   HDL 46 (L) 05/28/2022   LDLCALC 116 (H) 05/28/2022   TRIG 248 (H) 05/28/2022   CHOLHDL  4.3 05/28/2022    Provider established cholesterol goals reviewed. Review and education. Let the patient know that she needs to call the office and get an appointment for follow up. Counseled on importance of regular laboratory monitoring as prescribed. Review of needing to have levels checked after change in medications Provided HLD educational materials Reviewed role and benefits of statin for ASCVD risk reduction Discussed strategies to  manage statin-induced myalgias Reviewed importance of limiting foods high in cholesterol. Education and support given.  Screening for signs and symptoms of depression related to chronic disease state Assessed social determinant of health barriers            Plan:Telephone follow up appointment with care management team member scheduled for:  12-15-2022 at [place] am  Noreene Larsson RN, MSN, CCM RN Care Manager  Chronic Care Management Direct Number: [phone]

## 2022-10-13 NOTE — Telephone Encounter (Signed)
Requested Prescriptions  Pending Prescriptions Disp Refills   traZODone (DESYREL) 50 MG tablet [Pharmacy Med Name: TRAZODONE '50MG'$  TABLETS] 90 tablet 0    Sig: TAKE 1 TABLET(50 MG) BY MOUTH AT BEDTIME AS NEEDED FOR SLEEP     Psychiatry: Antidepressants - Serotonin Modulator Passed - 10/10/2022  5:17 PM      Passed - Valid encounter within last 6 months    Recent Outpatient Visits           2 weeks ago Pure hypercholesterolemia   [facility] [place], Coralie Keens, NP   1 month ago Throat discomfort   [facility] [place], Coralie Keens, NP   3 months ago [facility] [place], Coralie Keens, NP   6 months ago Chronic right ear pain   [facility] Olin Hauser, DO   7 months ago Encounter for general adult medical examination with abnormal findings   [facility] [place], Coralie Keens, NP       Future Appointments             In 5 months Baity, Coralie Keens, NP [facility], [facility]

## 2022-10-13 NOTE — Patient Instructions (Signed)
Please call the care guide team at [phone] if you need to cancel or reschedule your appointment.   If you are experiencing a Mental Health or [place] or need someone to talk to, please call the Suicide and Crisis Lifeline: 988 call the [place] National Suicide Prevention Lifeline: 647-097-0085 or TTY: 308 698 0105 TTY (323)855-7975) to talk to a trained counselor call 1-800-273-TALK (toll free, 24 hour hotline)   Following is a copy of the CCM Program Consent:  CCM service includes personalized support from designated clinical staff supervised by the physician, including individualized plan of care and coordination with other care providers 24/7 contact phone numbers for assistance for urgent and routine care needs. Service will only be billed when office clinical staff spend 20 minutes or more in a month to coordinate care. Only one practitioner may furnish and bill the service in a calendar month. The patient may stop CCM services at amy time (effective at the end of the month) by phone call to the office staff. The patient will be responsible for cost sharing (co-pay) or up to 20% of the service fee (after annual deductible is met)  Following is a copy of your full provider care plan:   Goals Addressed             This Visit's Progress    CCM Expected Outcome:  Monitor, Self-Manage, and Reduce Symptoms of Hypertension       Current Barriers:  Knowledge Deficits related to the need to check her blood pressures on a regular basis to effectively manage HTN in the home setting Care Coordination needs related to ongoing support and education on monitoring changes in HTN or hearth health in a patient with HTN Chronic Disease Management support and education needs related to effective management of HTN Cognitive Deficits BP Readings from Last 3 Encounters:  09/26/22 128/66  09/09/22 (!) 165/76  08/27/22 136/80     Planned Interventions: Evaluation of current  treatment plan related to hypertension self management and patient's adherence to plan as established by provider. The patient saw the pcp on 09-26-2022. Denies any acute changes in HTN or heart health. States she feels like she is doing well. Knows to call the provider for changes or new needs. ;   Provided education to patient re: stroke prevention, s/s of heart attack and stroke; Reviewed prescribed diet the patient reminded to follow a heart healthy diet for effective management of her HTN and HLD. The patient states she is eating well, sometimes too much. The patient states that she is monitoring her sodium content and mindful of foods high in sodium and fats.  Reviewed medications with patient and discussed importance of compliance. The patient verbalized compliance with medications. Works with the [place] D on a regular basis for ongoing support and education. Reminder of appointment coming up in February to speak with the pharm D.  Discussed plans with patient for ongoing care management follow up and provided patient with direct contact information for care management team; Advised patient, providing education and rationale, to monitor blood pressure daily and record, calling PCP for findings outside established parameters. The patient continues to not take her blood pressures at home. She states she is fine and knows when her blood pressure is up because she has a headache. Review of the benefits of checking blood pressures on a regular basis. ;  Reviewed scheduled/upcoming provider appointments including: Saw the pcp on 09-26-2022. Next appointment on 03-27-2023 unless the patient needs to be seen sooner.  Knows to call for changes or needs.  Advised patient to discuss changes in her HTN or heart health with provider; Provided education on prescribed diet heart healthy;  Discussed complications of poorly controlled blood pressure such as heart disease, stroke, circulatory complications, vision  complications, kidney impairment, sexual dysfunction;  Screening for signs and symptoms of depression related to chronic disease state;  Assessed social determinant of health barriers;   Symptom Management: Take medications as prescribed   Attend all scheduled provider appointments Call provider office for new concerns or questions  call the Suicide and Crisis Lifeline: 988 call the [place] National Suicide Prevention Lifeline: 215-822-8781 or TTY: (250)496-8743 TTY (435)655-4680) to talk to a trained counselor call 1-800-273-TALK (toll free, 24 hour hotline) if experiencing a Mental Health or [place]  check blood pressure 3 times per week call doctor for signs and symptoms of high blood pressure develop an action plan for high blood pressure keep all doctor appointments take medications for blood pressure exactly as prescribed report new symptoms to your doctor  Follow Up Plan: Telephone follow up appointment with care management team member scheduled for: 12-15-2022 at 0945 am       CCM:  Maintain, Monitor and Self-Manage Symptoms of COPD       Current Barriers:  Care Coordination needs related to ongoing support and education  in a patient with COPD Chronic Disease Management support and education needs related to the effective management of COPD Cognitive Deficits  Planned Interventions: Provided patient with basic written and verbal COPD education on self care/management/and exacerbation prevention. The patient states she is doing well. Denies any new changes in her breathing. She continues to smoke. Is not interested in smoking cessation. Sees provider on a regular basis.  Advised patient to track and manage COPD triggers. Review of triggers that can cause exacerbation. The patient stays in when it is cold outside. She knows to be mindful of others who may be sick. States that her and her husband had a quiet Christmas holiday at home. Denies any issues with COPD  triggers, high incidence of exacerbations Provided written and verbal instructions on pursed lip breathing and utilized returned demonstration as teach back Provided instruction about proper use of medications used for management of COPD including inhalers Advised patient to self assesses COPD action plan zone and make appointment with provider if in the yellow zone for 48 hours without improvement Advised patient to engage in light exercise as tolerated 3-5 days a week to aid in the the management of COPD Provided education about and advised patient to utilize infection prevention strategies to reduce risk of respiratory infection. Review of being safe and monitoring for others who may have upper respiratory infections and to be mindful of the risk of infection and protecting self. The patient denies any acute changes in her COPD.  Discussed the importance of adequate rest and management of fatigue with COPD Screening for signs and symptoms of depression related to chronic disease state  Assessed social determinant of health barriers  Symptom Management: Take medications as prescribed   Attend all scheduled provider appointments Call provider office for new concerns or questions  call the Suicide and Crisis Lifeline: 988 call the [place] National Suicide Prevention Lifeline: 647-097-0115 or TTY: 641-604-6655 TTY 828-188-0966) to talk to a trained counselor call 1-800-273-TALK (toll free, 24 hour hotline) if experiencing a Mental Health or [place]  avoid second hand smoke eliminate smoking in my home identify and remove indoor air  pollutants limit outdoor activity during cold weather listen for public air quality announcements every day do breathing exercises every day begin a symptom diary develop a rescue plan eliminate symptom triggers at home follow rescue plan if symptoms flare-up  Follow Up Plan: Telephone follow up appointment with care management team member  scheduled for: 12-15-2022 at 0945 am       COMPLETED: RNCM: Effective Management of HLD       Care Coordination Interventions:Closing this goal and opening in new CCM Lab Results  Component Value Date   CHOL 197 05/28/2022   HDL 46 (L) 05/28/2022   LDLCALC 116 (H) 05/28/2022   TRIG 248 (H) 05/28/2022   CHOLHDL 4.3 05/28/2022    Provider established cholesterol goals reviewed. Review and education. Let the patient know that she needs to call the office and get an appointment for follow up. Counseled on importance of regular laboratory monitoring as prescribed. Review of needing to have levels checked after change in medications Provided HLD educational materials Reviewed role and benefits of statin for ASCVD risk reduction Discussed strategies to manage statin-induced myalgias Reviewed importance of limiting foods high in cholesterol. Education and support given.  Screening for signs and symptoms of depression related to chronic disease state Assessed social determinant of health barriers            Patient verbalizes understanding of instructions and care plan provided today and agrees to view in [place]. Active MyChart status and patient understanding of how to access instructions and care plan via MyChart confirmed with patient.     Telephone follow up appointment with care management team member scheduled for: 12-15-2022 at 0945 am

## 2022-10-14 ENCOUNTER — Other Ambulatory Visit: Payer: Self-pay | Admitting: Internal Medicine

## 2022-10-14 DIAGNOSIS — F419 Anxiety disorder, unspecified: Secondary | ICD-10-CM

## 2022-10-14 NOTE — Telephone Encounter (Signed)
Requested Prescriptions  Pending Prescriptions Disp Refills   citalopram (CELEXA) 20 MG tablet [Pharmacy Med Name: CITALOPRAM '20MG'$  TABLETS] 90 tablet 1    Sig: TAKE 1 TABLET(20 MG) BY MOUTH DAILY     Psychiatry:  Antidepressants - SSRI Passed - 10/14/2022 10:46 AM      Passed - Valid encounter within last 6 months    Recent Outpatient Visits           2 weeks ago Pure hypercholesterolemia   [facility] [place], Coralie Keens, NP   1 month ago Throat discomfort   [facility] [place], Coralie Keens, NP   3 months ago [facility] [place], Coralie Keens, NP   6 months ago Chronic right ear pain   [facility] Olin Hauser, DO   7 months ago Encounter for general adult medical examination with abnormal findings   [facility] [place], Coralie Keens, NP       Future Appointments             In 5 months Baity, Coralie Keens, NP [facility], [facility]

## 2022-10-17 ENCOUNTER — Ambulatory Visit (INDEPENDENT_AMBULATORY_CARE_PROVIDER_SITE_OTHER): Payer: PPO | Admitting: Internal Medicine

## 2022-10-17 ENCOUNTER — Encounter: Payer: Self-pay | Admitting: Internal Medicine

## 2022-10-17 VITALS — BP 126/82 | HR 63 | Temp 97.5°F | Wt 167.0 lb

## 2022-10-17 DIAGNOSIS — E213 Hyperparathyroidism, unspecified: Secondary | ICD-10-CM | POA: Diagnosis not present

## 2022-10-17 DIAGNOSIS — E039 Hypothyroidism, unspecified: Secondary | ICD-10-CM | POA: Diagnosis not present

## 2022-10-17 DIAGNOSIS — R070 Pain in throat: Secondary | ICD-10-CM | POA: Diagnosis not present

## 2022-10-17 MED ORDER — LEVOTHYROXINE SODIUM 175 MCG PO TABS: 175.0000 ug | ORAL_TABLET | Freq: Every day | ORAL | 0 refills | Status: DC

## 2022-10-17 MED ORDER — ATORVASTATIN CALCIUM 40 MG PO TABS: 40.0000 mg | ORAL_TABLET | Freq: Every day | ORAL | 1 refills | Status: DC

## 2022-10-17 NOTE — Progress Notes (Signed)
Subjective:    Patient ID: [name], female    DOB: 11-12-1940, 82 y.o.   MRN: [id number]  HPI  Patient presents to clinic today with complaint of throat discomfort.  This has been an ongoing issue for years.  She describes the pain as soreness.  She also reports she feels knots in her thyroid.  She had a thyroid ultrasound 11/2021 that did not show any nodules.  Her recent TSH was abnormal but her free T4 was normal.  We need to increase her levothyroxine to 175 mcg daily.  She has seen ENT in the past and she reports that they did absolutely nothing for her throat discomfort.  She really thinks this is her thyroid and she would like to see a thyroid specialist.    Review of Systems  Past Medical History:  Diagnosis Date   Allergy    Anemia    Anxiety    Arthritis    Cancer ([place])    skin cancer   Hyperlipidemia    Hypertension    Hypothyroidism    Sciatica    Sleep apnea    does not use cpap   Thrombocytopenia (HCC)    Thyroid disease     Current Outpatient Medications  Medication Sig Dispense Refill   acetaminophen (TYLENOL) 500 MG tablet Take 500 mg by mouth every 6 (six) hours as needed for moderate pain.      amLODipine (NORVASC) 10 MG tablet TAKE 1 TABLET(10 MG) BY MOUTH DAILY 90 tablet 1   atorvastatin (LIPITOR) 20 MG tablet Take 1 tablet (20 mg total) by mouth daily. 90 tablet 1   citalopram (CELEXA) 20 MG tablet TAKE 1 TABLET(20 MG) BY MOUTH DAILY 90 tablet 1   levothyroxine (SYNTHROID) 150 MCG tablet TAKE 1 TABLET(150 MCG) BY MOUTH DAILY 90 tablet 1   losartan (COZAAR) 100 MG tablet TAKE 1 TABLET(100 MG) BY MOUTH DAILY 90 tablet 1   metoprolol succinate (TOPROL-XL) 50 MG 24 hr tablet TAKE 1 TABLET BY MOUTH ONCE A DAY WITH OR IMMEDIATELY FOLLOWING A MEAL 90 tablet 1   Multiple Vitamin (MULTIVITAMIN) capsule Take 1 capsule by mouth daily.     rOPINIRole (REQUIP) 0.5 MG tablet TAKE 1 TABLET(0.5 MG) BY MOUTH AT BEDTIME 90 tablet 1   traZODone (DESYREL) 50 MG  tablet TAKE 1 TABLET(50 MG) BY MOUTH AT BEDTIME AS NEEDED FOR SLEEP 90 tablet 0   No current facility-administered medications for this visit.    Allergies  Allergen Reactions   Codeine Nausea Only    Tolerates oxycodone   Morphine Nausea And Vomiting   Ace Inhibitors Cough   Adhesive [Tape] Other (See Comments) and Rash    whelps    Family History  Problem Relation Age of Onset   Dementia Mother    Dementia Sister    Heart attack Brother    Diabetes Brother    Breast cancer Paternal Aunt    Heart attack Son    Hypercalcemia Neg Hx    Cancer Neg Hx     Social History   Socioeconomic History   Marital status: Married    Spouse name: ROBERT   Number of children: 4   Years of education: Not on file   Highest education level: Not on file  Occupational History   Occupation: retired   Occupation: Retired  Tobacco Use   Smoking status: Every Day    Packs/day: 0.50    Years: 40.00    Total pack years: 20.00  Types: Cigarettes   Smokeless tobacco: Never  Vaping Use   Vaping Use: Never used  Substance and Sexual Activity   Alcohol use: No   Drug use: No   Sexual activity: Not Currently  Other Topics Concern   Not on file  Social History Narrative   Not on file   Social Determinants of Health   Financial Resource Strain: Low Risk  (08/11/2022)   Overall Financial Resource Strain (CARDIA)    Difficulty of Paying Living Expenses: Not hard at all  Food Insecurity: No Food Insecurity (08/11/2022)   Hunger Vital Sign    Worried About Running Out of Food in the Last Year: Never true    Ran Out of Food in the Last Year: Never true  Transportation Needs: No Transportation Needs (08/11/2022)   PRAPARE - Hydrologist (Medical): No    Lack of Transportation (Non-Medical): No  Physical Activity: Sufficiently Active (12/16/2021)   Exercise Vital Sign    Days of Exercise per Week: 5 days    Minutes of Exercise per Session: 30 min  Stress:  No Stress Concern Present (08/11/2022)   [place]    Feeling of Stress : Not at all  Social Connections: Moderately Integrated (08/11/2022)   Social Connection and Isolation Panel [NHANES]    Frequency of Communication with Friends and Family: Once a week    Frequency of Social Gatherings with Friends and Family: Once a week    Attends Religious Services: More than 4 times per year    Active Member of [employer] or Organizations: Yes    Attends Music therapist: More than 4 times per year    Marital Status: Married  Human resources officer Violence: Not At Risk (08/11/2022)   Humiliation, Afraid, Rape, and Kick questionnaire    Fear of Current or Ex-Partner: No    Emotionally Abused: No    Physically Abused: No    Sexually Abused: No     Constitutional: Denies fever, malaise, fatigue, headache or abrupt weight changes.  HEENT: Patient reports anterior throat discomfort.  Denies eye pain, eye redness, ear pain, ringing in the ears, wax buildup, runny nose, nasal congestion, bloody nose, or sore throat. Respiratory: Denies difficulty breathing, shortness of breath, cough or sputum production.   Cardiovascular: Denies chest pain, chest tightness, palpitations or swelling in the hands or feet.  Skin: Denies redness, rashes, lesions or ulcercations.  Neurological: Patient has difficulty with memory, difficulty with balance.  Denies dizziness, difficulty with speech or problems with coordination.    No other specific complaints in a complete review of systems (except as listed in HPI above).     Objective:   Physical Exam  BP 126/82 (BP Location: Left Arm, Patient Position: Sitting, Cuff Size: Normal)   Pulse 63   Temp (!) 97.5 F (36.4 C) (Temporal)   Wt 167 lb (75.8 kg)   LMP  (LMP Unknown)   SpO2 98%   BMI 31.04 kg/m   Wt Readings from Last 3 Encounters:  09/26/22 167 lb (75.8 kg)  09/09/22 166 lb 3.2 oz  (75.4 kg)  08/27/22 163 lb (73.9 kg)    General: Appears her stated age, obese in NAD. Skin: Warm, dry and intact.  HEENT: Head: normal shape and size; Eyes: sclera white, no icterus, conjunctiva pink, PERRLA and EOMs intact;  Neck:  Neck supple, trachea midline. No thyroid nodules noted however she does report discomfort with palpation of  the thyroid. Cardiovascular: Normal rate and rhythm. S1,S2 noted.  No murmur, rubs or gallops noted.  Pulmonary/Chest: Normal effort and positive vesicular breath sounds. No respiratory distress. No wheezes, rales or ronchi noted.  Neurological: Alert and oriented.  Difficulty with recall.   BMET    Component Value Date/Time   NA 143 09/26/2022 1116   NA 145 (H) 12/21/2017 1017   NA 139 08/26/2012 2035   K 4.0 09/26/2022 1116   K 3.3 (L) 08/26/2012 2035   CL 107 09/26/2022 1116   CL 105 08/26/2012 2035   CO2 26 09/26/2022 1116   CO2 29 08/26/2012 2035   GLUCOSE 105 (H) 09/26/2022 1116   GLUCOSE 97 08/26/2012 2035   BUN 13 09/26/2022 1116   BUN 13 12/21/2017 1017   BUN 13 08/26/2012 2035   CREATININE 0.83 09/26/2022 1116   CALCIUM 10.1 09/26/2022 1116   CALCIUM 9.9 08/26/2012 2035   GFRNONAA >60 07/18/2022 0928   GFRNONAA 66 09/11/2020 1147   GFRAA 76 09/11/2020 1147    Lipid Panel     Component Value Date/Time   CHOL 200 (H) 09/26/2022 1116   CHOL 181 12/21/2017 1017   TRIG 292 (H) 09/26/2022 1116   HDL 50 09/26/2022 1116   HDL 50 12/21/2017 1017   CHOLHDL 4.0 09/26/2022 1116   LDLCALC 108 (H) 09/26/2022 1116    CBC    Component Value Date/Time   WBC 6.8 09/26/2022 1116   RBC 4.42 09/26/2022 1116   HGB 15.1 09/26/2022 1116   HGB 14.8 12/21/2017 1017   HCT 43.4 09/26/2022 1116   HCT 42.5 12/21/2017 1017   PLT 120 (L) 09/26/2022 1116   PLT 141 (L) 12/21/2017 1017   MCV 98.2 09/26/2022 1116   MCV 93 12/21/2017 1017   MCV 92 08/26/2012 2035   MCH 34.2 (H) 09/26/2022 1116   MCHC 34.8 09/26/2022 1116   RDW 12.5 09/26/2022  1116   RDW 12.8 12/21/2017 1017   RDW 13.1 08/26/2012 2035   LYMPHSABS 2.8 02/11/2022 1307   LYMPHSABS 2.5 12/21/2017 1017   MONOABS 0.8 02/11/2022 1307   EOSABS 0.1 02/11/2022 1307   EOSABS 0.3 12/21/2017 1017   BASOSABS 0.1 02/11/2022 1307   BASOSABS 0.0 12/21/2017 1017    Hgb A1C Lab Results  Component Value Date   HGBA1C 5.6 09/26/2022           Assessment & Plan:   Throat Discomfort, Hypothyroidism:  Will increase levothyroxine to 175 mcg daily Referral to endocrinology placed per her request  RTC in 6 months for your annual exam [name], NP

## 2022-10-22 DIAGNOSIS — F17210 Nicotine dependence, cigarettes, uncomplicated: Secondary | ICD-10-CM | POA: Diagnosis not present

## 2022-10-22 DIAGNOSIS — J449 Chronic obstructive pulmonary disease, unspecified: Secondary | ICD-10-CM | POA: Diagnosis not present

## 2022-10-22 DIAGNOSIS — I10 Essential (primary) hypertension: Secondary | ICD-10-CM | POA: Diagnosis not present

## 2022-10-26 ENCOUNTER — Other Ambulatory Visit: Payer: Self-pay | Admitting: Internal Medicine

## 2022-10-26 DIAGNOSIS — J029 Acute pharyngitis, unspecified: Secondary | ICD-10-CM

## 2022-10-27 NOTE — Telephone Encounter (Signed)
Requested Prescriptions  Pending Prescriptions Disp Refills   metoprolol succinate (TOPROL-XL) 50 MG 24 hr tablet [Pharmacy Med Name: METOPROLOL ER SUCCINATE '50MG'$  TABS] 90 tablet 1    Sig: TAKE 1 TABLET BY MOUTH EVERY DAY WITH OR IMMEDIATELY FOLLOWING A MEAL     Cardiovascular:  Beta Blockers Passed - 10/26/2022 12:01 PM      Passed - Last BP in normal range    BP Readings from Last 1 Encounters:  10/17/22 126/82         Passed - Last Heart Rate in normal range    Pulse Readings from Last 1 Encounters:  10/17/22 63         Passed - Valid encounter within last 6 months    Recent Outpatient Visits           1 week ago Acquired hypothyroidism   [facility] [place], Coralie Keens, NP   1 month ago Pure hypercholesterolemia   [facility] [place], Coralie Keens, NP   2 months ago Throat discomfort   [facility] [place], Coralie Keens, NP   4 months ago [facility] [place], Coralie Keens, NP   6 months ago Chronic right ear pain   [facility] Olin Hauser, DO       Future Appointments             In 5 months Baity, Coralie Keens, NP [facility], [facility]

## 2022-11-06 ENCOUNTER — Other Ambulatory Visit: Payer: Self-pay | Admitting: Internal Medicine

## 2022-11-06 NOTE — Telephone Encounter (Signed)
Requested Prescriptions  Pending Prescriptions Disp Refills   losartan (COZAAR) 100 MG tablet [Pharmacy Med Name: LOSARTAN 100MG TABLETS] 90 tablet 0    Sig: TAKE 1 TABLET(100 MG) BY MOUTH DAILY     Cardiovascular:  Angiotensin Receptor Blockers Passed - 11/06/2022  3:12 AM      Passed - Cr in normal range and within 180 days    Creat  Date Value Ref Range Status  09/26/2022 0.83 0.60 - 0.95 mg/dL Final         Passed - K in normal range and within 180 days    Potassium  Date Value Ref Range Status  09/26/2022 4.0 3.5 - 5.3 mmol/L Final  08/26/2012 3.3 (L) 3.5 - 5.1 mmol/L Final         Passed - Patient is not pregnant      Passed - Last BP in normal range    BP Readings from Last 1 Encounters:  10/17/22 126/82         Passed - Valid encounter within last 6 months    Recent Outpatient Visits           2 weeks ago Acquired hypothyroidism   [facility] [place], Coralie Keens, NP   1 month ago Pure hypercholesterolemia   [facility] [place], Coralie Keens, NP   2 months ago Throat discomfort   [facility] [place], Coralie Keens, NP   4 months ago [facility] [place], Coralie Keens, NP   7 months ago Chronic right ear pain   [facility] Olin Hauser, DO       Future Appointments             In 4 months Baity, Coralie Keens, NP [facility], [facility]

## 2022-11-07 ENCOUNTER — Ambulatory Visit (INDEPENDENT_AMBULATORY_CARE_PROVIDER_SITE_OTHER): Payer: PPO | Admitting: Pharmacist

## 2022-11-07 ENCOUNTER — Telehealth: Payer: PPO

## 2022-11-07 DIAGNOSIS — E039 Hypothyroidism, unspecified: Secondary | ICD-10-CM

## 2022-11-07 DIAGNOSIS — E7800 Pure hypercholesterolemia, unspecified: Secondary | ICD-10-CM

## 2022-11-07 DIAGNOSIS — I10 Essential (primary) hypertension: Secondary | ICD-10-CM

## 2022-11-07 NOTE — Chronic Care Management (AMB) (Signed)
Chronic Care Management CCM Pharmacy Note  11/07/2022 Name:  TZIREL MADIA MRN:  [id number] DOB:  1941/07/09   Subjective: [name] is an 82 y.o. year old female who is a primary patient of [name], NP.  The CCM team was consulted for assistance with disease management and care coordination needs.    Engaged with patient by telephone for follow up visit for pharmacy case management and/or care coordination services.   Objective:  Medications Reviewed Today     Reviewed by Rennis Petty, RPH-CPP (Pharmacist) on 11/07/22 at [place] List Status: <None>   Medication Order Taking? Sig Documenting Provider Last Dose Status Informant  acetaminophen (TYLENOL) 500 MG tablet [id number] Yes Take 500 mg by mouth every 6 (six) hours as needed for moderate pain.  [provider] Taking Active Self  amLODipine (NORVASC) 10 MG tablet [id number] Yes TAKE 1 TABLET(10 MG) BY MOUTH DAILY Baity, Coralie Keens, NP Taking Active   atorvastatin (LIPITOR) 40 MG tablet [id number]  Take 1 tablet (40 mg total) by mouth daily. [name], NP  Active   citalopram (CELEXA) 20 MG tablet [id number] Yes TAKE 1 TABLET(20 MG) BY MOUTH DAILY Garnette Gunner Coralie Keens, NP Taking Active   levothyroxine (SYNTHROID) 175 MCG tablet [id number]  Take 1 tablet (175 mcg total) by mouth daily. [name], NP  Active   losartan (COZAAR) 100 MG tablet [id number] Yes TAKE 1 TABLET(100 MG) BY MOUTH DAILY Baity, Coralie Keens, NP Taking Active   metoprolol succinate (TOPROL-XL) 50 MG 24 hr tablet [id number] Yes TAKE 1 TABLET BY MOUTH EVERY DAY WITH OR IMMEDIATELY FOLLOWING A MEAL Baity, Coralie Keens, NP Taking Active   Multiple Vitamin (MULTIVITAMIN) capsule [id number] Yes Take 1 capsule by mouth daily. [provider] Taking Active   rOPINIRole (REQUIP) 0.5 MG tablet [id number] Yes TAKE 1 TABLET(0.5 MG) BY MOUTH AT BEDTIME [name], NP Taking Active   traZODone (DESYREL) 50 MG tablet [id number] Yes TAKE 1  TABLET(50 MG) BY MOUTH AT BEDTIME AS NEEDED FOR SLEEP [name], NP Taking Active             Pertinent Labs:  Lab Results  Component Value Date   HGBA1C 5.6 09/26/2022   Lab Results  Component Value Date   CHOL 200 (H) 09/26/2022   HDL 50 09/26/2022   LDLCALC 108 (H) 09/26/2022   TRIG 292 (H) 09/26/2022   CHOLHDL 4.0 09/26/2022   Lab Results  Component Value Date   CREATININE 0.83 09/26/2022   BUN 13 09/26/2022   NA 143 09/26/2022   K 4.0 09/26/2022   CL 107 09/26/2022   CO2 26 09/26/2022   Lab Results  Component Value Date   TSH 8.84 (H) 09/26/2022   T4TOTAL 6.9 12/05/2020   BP Readings from Last 3 Encounters:  10/17/22 126/82  09/26/22 128/66  09/09/22 (!) 165/76   Pulse Readings from Last 3 Encounters:  10/17/22 63  09/26/22 (!) 57  09/09/22 64     SDOH:  (Social Determinants of Health) assessments and interventions performed:  SDOH Interventions    Flowsheet Row Chronic Care Management from 08/11/2022 in [place] Coordination from 06/02/2022 in [place] from 12/16/2021 in [facility] Office Visit from 08/20/2021 in [place] Management from 06/24/2021 in [facility] Office Visit from 05/08/2021 in [place]  [facility]  SDOH Interventions        Food Insecurity Interventions Intervention Not Indicated -- Intervention Not Indicated -- Intervention Not Indicated --  Housing Interventions Intervention Not Indicated -- Intervention Not Indicated -- -- --  Transportation Interventions Intervention Not Indicated -- Intervention Not Indicated -- Intervention Not Indicated --  Utilities Interventions Intervention Not Indicated Intervention Not Indicated -- -- -- --  Alcohol Usage Interventions Intervention Not Indicated (Score <7) -- -- -- -- --   Depression Interventions/Treatment  -- -- -- PHQ2-9 Score <4 Follow-up Not Indicated -- Currently on Treatment  Financial Strain Interventions Intervention Not Indicated -- Intervention Not Indicated -- Intervention Not Indicated --  Physical Activity Interventions -- -- Intervention Not Indicated -- Other (Comments)  [no structured activity] --  Stress Interventions Intervention Not Indicated Intervention Not Indicated Intervention Not Indicated -- Intervention Not Indicated --  Social Connections Interventions Intervention Not Indicated -- Intervention Not Indicated -- Other (Comment)  [good support system] --       CCM Care Plan  Review of patient past medical history, allergies, medications, health status, including review of consultants reports, laboratory and other test data, was performed as part of comprehensive evaluation and provision of chronic care management services.   Care Plan : PharmD - Medication Management; HTN  Updates made by Rennis Petty, RPH-CPP since 11/07/2022 12:00 AM     Problem: Disease Progression      Long-Range Goal: Disease Progression Prevented or Minimized   Start Date: 07/01/2021  Expected End Date: 09/29/2021  Recent Progress: On track  Priority: High  Note:   Current Barriers:  Unable to maintain control of hypertension Lack of home blood pressure readings to share with clinical team  Pharmacist Clinical Goal(s):  patient will achieve adherence to monitoring guidelines and medication adherence to achieve therapeutic efficacy maintain control of hypertension as evidenced by blood pressure readings through collaboration with PharmD and provider.    Interventions: 1:1 collaboration with [name], NP regarding development and update of comprehensive plan of care as evidenced by provider attestation and co-signature Inter-disciplinary care team collaboration (see longitudinal plan of care) Perform chart review. Patient seen for Office  Visit with PCP on 1/26. Provider advised: Will increase levothyroxine to 175 mcg daily Referral to endocrinology placed per her request Today patient reports Endocrinology appointment scheduled with Dr. Ronnald Collum in March  Hypertension:   Current treatment: Amlodipine 10 mg daily Metoprolol ER 50 mg QAM Losartan 100 mg daily Denies monitoring home blood pressure Have counseled patient on home BP monitoring technique Have encouraged patient to start monitoring home BP, to keep a log of the results, have this record for [place] to review during telephone appointment, but to call office sooner or readings outside of established parameter or for new/worsening symptoms Patient denies interest in home monitoring at this time  Hyperlipidemia: Current treatment: atorvastatin 40 mg daily From review of chart, note per provider notes from latest lipid panel on 09/26/2022, provider again advised patient to increase atorvastatin dose to 40 mg daily. Identify that patient has continued to take atorvastatin 20 mg daily, rather than increasing to 40 mg daily Find patient has picked up the atorvastatin 40 mg Rx, but not yet started taking Today states discarding atorvastatin 20 mg dose now Patient verbalizes understanding via teach back of plan from PCP to increase dose and confirms will start taking as directed. Have encouraged patient to decrease saturated and trans fats in her diet  Hypothyroidism:  Controlled; current treatment: levothyroxine 175 mcg QAM Identify patient has continued to take levothyroxine 150 mcg QAM, rather than increasing to 175 mcg QAM as recommended by PCP on 1/26 Find patient has picked up the levothyroxine 175 mcg Rx, but not yet started taking Today states discarding levothyroxine 150 mcg dose now Patient verbalizes understanding via teach back of plan from PCP to increase dose and confirms will start taking as directed.  Patient Goals/Self-Care Activities patient will:  - take  medications as prescribed - check blood pressure, document, and provide at future appointments  Follow Up Plan: The care management team will reach out to the patient again in 3 months       Wallace Cullens, PharmD, [facility] [phone]

## 2022-11-07 NOTE — Patient Instructions (Signed)
Visit Information  Thank you for taking time to visit with me today. Please don't hesitate to contact me if I can be of assistance to you before our next scheduled telephone appointment.  Following are the goals we discussed today:   Goals Addressed             This Visit's Progress    Pharmacy Goals       Please obtain new upper arm blood pressure monitor and restart checking your home blood pressure  We recommend a blood pressure cuff that goes around your upper arm, as these are generally the most accurate.   To appropriately check your blood pressure, make sure you do the following:  1) Avoid caffeine, exercise, or tobacco products for 30 minutes before checking. 2) Sit with your back supported in a flat-backed chair. Rest your arm on something flat (arm of the chair, table, etc). 3) Sit still with your feet flat on the floor, resting, for at least 5 minutes.  4) Check your blood pressure. Take 1-2 readings.   Write down these readings and bring with you to any provider appointments. Bring your home blood pressure machine with you to a provider's office for accuracy comparison at least once a year. Make sure you take your blood pressure medications before you come to any office visit.   Feel free to call me with any questions or concerns. I look forward to our next call!  Wallace Cullens, PharmD, [place] [phone]         Our next appointment is by telephone on 01/30/2023 at 11:45 am  Please call the care guide team at [phone] if you need to cancel or reschedule your appointment.    Patient verbalizes understanding of instructions and care plan provided today and agrees to view in [place]. Active MyChart status and patient understanding of how to access instructions and care plan via MyChart confirmed with patient.

## 2022-11-17 ENCOUNTER — Other Ambulatory Visit: Payer: Self-pay | Admitting: Internal Medicine

## 2022-11-18 NOTE — Telephone Encounter (Signed)
Rx 11/06/22 #90- too soon Requested Prescriptions  Pending Prescriptions Disp Refills   losartan (COZAAR) 100 MG tablet [Pharmacy Med Name: LOSARTAN '100MG'$  TABLETS] 90 tablet 0    Sig: TAKE 1 TABLET(100 MG) BY MOUTH DAILY     Cardiovascular:  Angiotensin Receptor Blockers Passed - 11/17/2022 12:45 PM      Passed - Cr in normal range and within 180 days    Creat  Date Value Ref Range Status  09/26/2022 0.83 0.60 - 0.95 mg/dL Final         Passed - K in normal range and within 180 days    Potassium  Date Value Ref Range Status  09/26/2022 4.0 3.5 - 5.3 mmol/L Final  08/26/2012 3.3 (L) 3.5 - 5.1 mmol/L Final         Passed - Patient is not pregnant      Passed - Last BP in normal range    BP Readings from Last 1 Encounters:  10/17/22 126/82         Passed - Valid encounter within last 6 months    Recent Outpatient Visits           1 month ago Acquired hypothyroidism   [place], Coralie Keens, NP   1 month ago Pure hypercholesterolemia   [facility] [place], Coralie Keens, NP   2 months ago Throat discomfort   [facility] [place], Coralie Keens, NP   4 months ago [facility] [place], Coralie Keens, NP   7 months ago Chronic right ear pain   [facility] Olin Hauser, DO       Future Appointments             In 4 months Baity, Coralie Keens, NP [facility], [facility]

## 2022-11-20 DIAGNOSIS — E039 Hypothyroidism, unspecified: Secondary | ICD-10-CM

## 2022-11-20 DIAGNOSIS — I10 Essential (primary) hypertension: Secondary | ICD-10-CM | POA: Diagnosis not present

## 2022-11-20 DIAGNOSIS — E785 Hyperlipidemia, unspecified: Secondary | ICD-10-CM | POA: Diagnosis not present

## 2022-11-27 ENCOUNTER — Telehealth: Payer: Self-pay | Admitting: Internal Medicine

## 2022-11-27 NOTE — Telephone Encounter (Signed)
Contacted [name] to schedule their annual wellness visit. Appointment made for 12/26/2022.  Sherol D[place]; Care Guide Ambulatory Clinical [facility] Direct Dial: [phone]

## 2022-12-01 ENCOUNTER — Encounter: Payer: Self-pay | Admitting: Internal Medicine

## 2022-12-01 ENCOUNTER — Ambulatory Visit (INDEPENDENT_AMBULATORY_CARE_PROVIDER_SITE_OTHER): Payer: PPO | Admitting: Internal Medicine

## 2022-12-01 VITALS — BP 136/74 | HR 56 | Temp 97.3°F | Wt 166.0 lb

## 2022-12-01 DIAGNOSIS — L308 Other specified dermatitis: Secondary | ICD-10-CM

## 2022-12-01 MED ORDER — TRIAMCINOLONE ACETONIDE 0.1 % EX CREA: 1.0000 | TOPICAL_CREAM | Freq: Two times a day (BID) | CUTANEOUS | 0 refills | Status: DC

## 2022-12-01 NOTE — Progress Notes (Signed)
Subjective:    Patient ID: [name], female    DOB: April 28, 1941, 82 y.o.   MRN: [id number]  HPI  Patient presents to clinic today with complaint of a rash of her face.  She noticed this 1 week ago. She reports it seems like dry skin. She denies changes in soaps, lotions or detergents. She has not changed facial products. She has been trying to apply lotion with minimal relief of symptoms.  Review of Systems     Past Medical History:  Diagnosis Date   Allergy    Anemia    Anxiety    Arthritis    Cancer ([place])    skin cancer   Hyperlipidemia    Hypertension    Hypothyroidism    Sciatica    Sleep apnea    does not use cpap   Thrombocytopenia (HCC)    Thyroid disease     Current Outpatient Medications  Medication Sig Dispense Refill   acetaminophen (TYLENOL) 500 MG tablet Take 500 mg by mouth every 6 (six) hours as needed for moderate pain.      amLODipine (NORVASC) 10 MG tablet TAKE 1 TABLET(10 MG) BY MOUTH DAILY 90 tablet 1   atorvastatin (LIPITOR) 40 MG tablet Take 1 tablet (40 mg total) by mouth daily. 90 tablet 1   citalopram (CELEXA) 20 MG tablet TAKE 1 TABLET(20 MG) BY MOUTH DAILY 90 tablet 1   levothyroxine (SYNTHROID) 175 MCG tablet Take 1 tablet (175 mcg total) by mouth daily. 90 tablet 0   losartan (COZAAR) 100 MG tablet TAKE 1 TABLET(100 MG) BY MOUTH DAILY 90 tablet 0   metoprolol succinate (TOPROL-XL) 50 MG 24 hr tablet TAKE 1 TABLET BY MOUTH EVERY DAY WITH OR IMMEDIATELY FOLLOWING A MEAL 90 tablet 1   Multiple Vitamin (MULTIVITAMIN) capsule Take 1 capsule by mouth daily.     rOPINIRole (REQUIP) 0.5 MG tablet TAKE 1 TABLET(0.5 MG) BY MOUTH AT BEDTIME 90 tablet 1   traZODone (DESYREL) 50 MG tablet TAKE 1 TABLET(50 MG) BY MOUTH AT BEDTIME AS NEEDED FOR SLEEP 90 tablet 0   No current facility-administered medications for this visit.    Allergies  Allergen Reactions   Codeine Nausea Only    Tolerates oxycodone   Morphine Nausea And Vomiting   Ace  Inhibitors Cough   Adhesive [Tape] Other (See Comments) and Rash    whelps    Family History  Problem Relation Age of Onset   Dementia Mother    Dementia Sister    Heart attack Brother    Diabetes Brother    Breast cancer Paternal Aunt    Heart attack Son    Hypercalcemia Neg Hx    Cancer Neg Hx     Social History   Socioeconomic History   Marital status: Married    Spouse name: ROBERT   Number of children: 4   Years of education: Not on file   Highest education level: Not on file  Occupational History   Occupation: retired   Occupation: Retired  Tobacco Use   Smoking status: Every Day    Packs/day: 0.50    Years: 40.00    Total pack years: 20.00    Types: Cigarettes   Smokeless tobacco: Never  Vaping Use   Vaping Use: Never used  Substance and Sexual Activity   Alcohol use: No   Drug use: No   Sexual activity: Not Currently  Other Topics Concern   Not on file  Social History Narrative  Not on file   Social Determinants of Health   Financial Resource Strain: Low Risk  (08/11/2022)   Overall Financial Resource Strain (CARDIA)    Difficulty of Paying Living Expenses: Not hard at all  Food Insecurity: No Food Insecurity (08/11/2022)   Hunger Vital Sign    Worried About Running Out of Food in the Last Year: Never true    Ran Out of Food in the Last Year: Never true  Transportation Needs: No Transportation Needs (08/11/2022)   PRAPARE - Hydrologist (Medical): No    Lack of Transportation (Non-Medical): No  Physical Activity: Sufficiently Active (12/16/2021)   Exercise Vital Sign    Days of Exercise per Week: 5 days    Minutes of Exercise per Session: 30 min  Stress: No Stress Concern Present (08/11/2022)   [place]    Feeling of Stress : Not at all  Social Connections: Moderately Integrated (08/11/2022)   Social Connection and Isolation Panel [NHANES]     Frequency of Communication with Friends and Family: Once a week    Frequency of Social Gatherings with Friends and Family: Once a week    Attends Religious Services: More than 4 times per year    Active Member of [employer] or Organizations: Yes    Attends Music therapist: More than 4 times per year    Marital Status: Married  Human resources officer Violence: Not At Risk (08/11/2022)   Humiliation, Afraid, Rape, and Kick questionnaire    Fear of Current or Ex-Partner: No    Emotionally Abused: No    Physically Abused: No    Sexually Abused: No     Constitutional: Denies fever, malaise, fatigue, headache or abrupt weight changes.  Respiratory: Denies difficulty breathing, shortness of breath, cough or sputum production.   Cardiovascular: Denies chest pain, chest tightness, palpitations or swelling in the hands or feet.  Skin: Patient reports rash of face.  Denies redness, lesions or ulcercations.   No other specific complaints in a complete review of systems (except as listed in HPI above).  Objective:   Physical Exam  BP 136/74 (BP Location: Right Arm, Patient Position: Sitting, Cuff Size: Normal)   Pulse (!) 56   Temp (!) 97.3 F (36.3 C) (Temporal)   Wt 166 lb (75.3 kg)   LMP  (LMP Unknown)   BMI 30.86 kg/m   Wt Readings from Last 3 Encounters:  10/17/22 167 lb (75.8 kg)  09/26/22 167 lb (75.8 kg)  09/09/22 166 lb 3.2 oz (75.4 kg)    General: Appears her stated age, obese, in NAD. Skin: Dry, scaly, flaky skin noted of the forehead and bilateral nasal folds. HEENT: Head: normal shape and size; Eyes: sclera white, no icterus, conjunctiva pink, PERRLA and EOMs intact;  Cardiovascular: Bradycardic with normal rhythm.  Pulmonary/Chest: Normal effort and positive vesicular breath sounds. No respiratory distress. No wheezes, rales or ronchi noted.  Neurological: Alert and oriented.   BMET    Component Value Date/Time   NA 143 09/26/2022 1116   NA 145 (H) 12/21/2017  1017   NA 139 08/26/2012 2035   K 4.0 09/26/2022 1116   K 3.3 (L) 08/26/2012 2035   CL 107 09/26/2022 1116   CL 105 08/26/2012 2035   CO2 26 09/26/2022 1116   CO2 29 08/26/2012 2035   GLUCOSE 105 (H) 09/26/2022 1116   GLUCOSE 97 08/26/2012 2035   BUN 13 09/26/2022 1116  BUN 13 12/21/2017 1017   BUN 13 08/26/2012 2035   CREATININE 0.83 09/26/2022 1116   CALCIUM 10.1 09/26/2022 1116   CALCIUM 9.9 08/26/2012 2035   GFRNONAA >60 07/18/2022 0928   GFRNONAA 66 09/11/2020 1147   GFRAA 76 09/11/2020 1147    Lipid Panel     Component Value Date/Time   CHOL 200 (H) 09/26/2022 1116   CHOL 181 12/21/2017 1017   TRIG 292 (H) 09/26/2022 1116   HDL 50 09/26/2022 1116   HDL 50 12/21/2017 1017   CHOLHDL 4.0 09/26/2022 1116   LDLCALC 108 (H) 09/26/2022 1116    CBC    Component Value Date/Time   WBC 6.8 09/26/2022 1116   RBC 4.42 09/26/2022 1116   HGB 15.1 09/26/2022 1116   HGB 14.8 12/21/2017 1017   HCT 43.4 09/26/2022 1116   HCT 42.5 12/21/2017 1017   PLT 120 (L) 09/26/2022 1116   PLT 141 (L) 12/21/2017 1017   MCV 98.2 09/26/2022 1116   MCV 93 12/21/2017 1017   MCV 92 08/26/2012 2035   MCH 34.2 (H) 09/26/2022 1116   MCHC 34.8 09/26/2022 1116   RDW 12.5 09/26/2022 1116   RDW 12.8 12/21/2017 1017   RDW 13.1 08/26/2012 2035   LYMPHSABS 2.8 02/11/2022 1307   LYMPHSABS 2.5 12/21/2017 1017   MONOABS 0.8 02/11/2022 1307   EOSABS 0.1 02/11/2022 1307   EOSABS 0.3 12/21/2017 1017   BASOSABS 0.1 02/11/2022 1307   BASOSABS 0.0 12/21/2017 1017    Hgb A1C Lab Results  Component Value Date   HGBA1C 5.6 09/26/2022           Assessment & Plan:   Eczema:  Advised her to wash her face with Cetaphil 2 times daily Apply triamcinolone 0.1% twice daily as needed-avoid contact with the eyes  RTC in 4 months for annual exam [name], NP

## 2022-12-01 NOTE — Patient Instructions (Signed)
Eczema Eczema refers to a group of skin conditions that cause skin to become rough and inflamed. Each type of eczema has different triggers, symptoms, and treatments. Eczema of any type is usually itchy. Symptoms range from mild to severe. Eczema is not spread from person to person (is not contagious). It can appear on different parts of the body at different times. One person's eczema may look different from another person's eczema. What are the causes? The exact cause of this condition is not known. However, exposure to certain environmental factors, irritants, and allergens can make the condition worse. What are the signs or symptoms? Symptoms of this condition depend on the type of eczema you have. The types include: Contact dermatitis. There are two kinds: Irritant contact dermatitis. This happens when something irritates the skin and causes a rash. Allergic contact dermatitis. This happens when your skin comes in contact with something you are allergic to (allergens). This can include poison ivy, chemicals, or medicines that were applied to your skin. Atopic dermatitis. This is a long-term (chronic) skin disease that keeps coming back (recurring). It is the most common type of eczema. Usual symptoms are a red rash and itchy, dry, scaly skin. It usually starts showing signs in infancy and can last through adulthood. Dyshidrotic eczema. This is a form of eczema on the hands and feet. It shows up as very itchy, fluid-filled blisters. It can affect people of any age but is more common before age 40. Hand eczema. This causes very itchy areas of skin on the palms and sides of the hands and fingers. This type of eczema is common in industrial jobs where you may be exposed to different types of irritants. Lichen simplex chronicus. This type of eczema occurs when a person constantly scratches one area of the body. Repeated scratching of the area leads to thickened skin (lichenification). This condition can  accompany other types of eczema. It is more common in adults but may also be seen in children. Nummular eczema. This is a common type of eczema that most often affects the lower legs and the backs of the hands. It typically causes an itchy, red, circular, crusty lesion (plaque). Scratching may become a habit and can cause bleeding. Nummular eczema occurs most often in middle-aged or older people. Seborrheic dermatitis. This is a common skin disease that mainly affects the scalp. It may also affect other oily areas of the body, such as the face, sides of the nose, eyebrows, ears, eyelids, and chest. It is marked by small scaling and redness of the skin (erythema). This can affect people of all ages. In infants, this condition is called cradle cap. Stasis dermatitis. This is a common skin disease that can cause itching, scaling, and hyperpigmentation, usually on the legs and feet. It occurs most often in people who have a condition that prevents blood from being pumped through the veins in the legs (chronic venous insufficiency). Stasis dermatitis is a chronic condition that needs long-term management. How is this diagnosed? This condition may be diagnosed based on: A physical exam of your skin. Your medical history. Skin patch tests. These tests involve using patches that contain possible allergens and placing them on your back. Your health care provider will check in a few days to see if an allergic reaction occurred. How is this treated? Treatment for eczema is based on the type of eczema you have. You may be given hydrocortisone steroid medicine or antihistamines. These can relieve itching quickly and help reduce inflammation.   These may be prescribed or purchased over the counter, depending on the strength that is needed. Follow these instructions at home: Take or apply over-the-counter and prescription medicines only as told by your health care provider. Use creams or ointments to moisturize your  skin. Do not use lotions. Learn what triggers or irritates your symptoms so you can avoid these things. Treat symptom flare-ups quickly. Do not scratch your skin. This can make your rash worse. Keep all follow-up visits. This is important. Where to find more information American Academy of Dermatology: aad.org National Eczema Association: nationaleczema.org The Society for Pediatric Dermatology: pedsderm.net Contact a health care provider if: You have severe itching, even with treatment. You scratch your skin regularly until it bleeds. Your rash looks different than usual. Your skin is painful, swollen, or more red than usual. You have a fever. Summary Eczema refers to a group of skin conditions that cause skin to become rough and inflamed. Each type has different triggers. Eczema of any type causes itching that may range from mild to severe. Treatment varies based on the type of eczema you have. Hydrocortisone steroid medicine or antihistamines can help with itching and inflammation. Protecting your skin is the best way to prevent eczema. Use creams or ointments to moisturize your skin. Avoid triggers and irritants. Treat flare-ups quickly. This information is not intended to replace advice given to you by your health care provider. Make sure you discuss any questions you have with your health care provider. Document Revised: 06/18/2020 Document Reviewed: 06/18/2020 Elsevier Patient Education  2023 Elsevier Inc.  

## 2022-12-15 ENCOUNTER — Telehealth: Payer: PPO

## 2022-12-15 ENCOUNTER — Ambulatory Visit (INDEPENDENT_AMBULATORY_CARE_PROVIDER_SITE_OTHER): Payer: PPO

## 2022-12-15 DIAGNOSIS — J4489 Other specified chronic obstructive pulmonary disease: Secondary | ICD-10-CM

## 2022-12-15 DIAGNOSIS — I10 Essential (primary) hypertension: Secondary | ICD-10-CM

## 2022-12-15 NOTE — Patient Instructions (Signed)
Please call the care guide team at [phone] if you need to cancel or reschedule your appointment.   If you are experiencing a Mental Health or [place] or need someone to talk to, please call the Suicide and Crisis Lifeline: 988 call the [place] National Suicide Prevention Lifeline: 786-817-4068 or TTY: (364)545-4170 TTY 431 874 2840) to talk to a trained counselor call 1-800-273-TALK (toll free, 24 hour hotline)   Following is a copy of the CCM Program Consent:  CCM service includes personalized support from designated clinical staff supervised by the physician, including individualized plan of care and coordination with other care providers 24/7 contact phone numbers for assistance for urgent and routine care needs. Service will only be billed when office clinical staff spend 20 minutes or more in a month to coordinate care. Only one practitioner may furnish and bill the service in a calendar month. The patient may stop CCM services at amy time (effective at the end of the month) by phone call to the office staff. The patient will be responsible for cost sharing (co-pay) or up to 20% of the service fee (after annual deductible is met)  Following is a copy of your full provider care plan:   Goals Addressed             This Visit's Progress    CCM Expected Outcome:  Monitor, Self-Manage, and Reduce Symptoms of Hypertension       Current Barriers:  Knowledge Deficits related to the need to check her blood pressures on a regular basis to effectively manage HTN in the home setting Care Coordination needs related to ongoing support and education on monitoring changes in HTN or hearth health in a patient with HTN Chronic Disease Management support and education needs related to effective management of HTN Cognitive Deficits BP Readings from Last 3 Encounters:  12/01/22 136/74  10/17/22 126/82  09/26/22 128/66     Planned Interventions: Evaluation of current treatment  plan related to hypertension self management and patient's adherence to plan as established by provider. The patient saw the pcp on 12-01-2022. Denies any acute changes in HTN or heart health. States she feels like she is doing well. Knows to call the provider for changes or new needs. ;   Provided education to patient re: stroke prevention, s/s of heart attack and stroke; Reviewed prescribed diet the patient reminded to follow a heart healthy diet for effective management of her HTN and HLD. The patient states she is eating well, sometimes too much. The patient states that she is monitoring her sodium content and mindful of foods high in sodium and fats.  Reviewed medications with patient and discussed importance of compliance. The patient verbalized compliance with medications. Works with the [facility] on a regular basis for ongoing support and education. Reminder of appointment coming up in February to speak with the pharm D.  Discussed plans with patient for ongoing care management follow up and provided patient with direct contact information for care management team; Advised patient, providing education and rationale, to monitor blood pressure daily and record, calling PCP for findings outside established parameters. The patient continues to not take her blood pressures at home. She states she is fine and knows when her blood pressure is up because she has a headache. Review of the benefits of checking blood pressures on a regular basis. ;  Reviewed scheduled/upcoming provider appointments including: Saw the pcp on 12-01-2022. Next appointment on 03-27-2023 unless the patient needs to be seen sooner. Knows  to call for changes or needs.  Advised patient to discuss changes in her HTN or heart health with provider; Provided education on prescribed diet heart healthy;  Discussed complications of poorly controlled blood pressure such as heart disease, stroke, circulatory complications, vision complications,  kidney impairment, sexual dysfunction;  Screening for signs and symptoms of depression related to chronic disease state;  Assessed social determinant of health barriers;  Discussed ways to help her back pain and discomfort. She has not used heat but discussed trying heat application to her back when she is having pain  Symptom Management: Take medications as prescribed   Attend all scheduled provider appointments Call provider office for new concerns or questions  call the Suicide and Crisis Lifeline: 988 call the [place] National Suicide Prevention Lifeline: 6463738910 or TTY: (210) 095-9576 TTY 667-485-9409) to talk to a trained counselor call 1-800-273-TALK (toll free, 24 hour hotline) if experiencing a Mental Health or [place]  check blood pressure 3 times per week call doctor for signs and symptoms of high blood pressure develop an action plan for high blood pressure keep all doctor appointments take medications for blood pressure exactly as prescribed report new symptoms to your doctor  Follow Up Plan: Telephone follow up appointment with care management team member scheduled for: 01-26-2023 at 0945 am       CCM:  Maintain, Monitor and Self-Manage Symptoms of COPD       Current Barriers:  Care Coordination needs related to ongoing support and education  in a patient with COPD Chronic Disease Management support and education needs related to the effective management of COPD Cognitive Deficits  Planned Interventions: Provided patient with basic written and verbal COPD education on self care/management/and exacerbation prevention. The patient states she is doing well. Denies any new changes in her breathing. She continues to smoke. Is not interested in smoking cessation. Sees provider on a regular basis.  Advised patient to track and manage COPD triggers. Review of triggers that can cause exacerbation. The patient stays in when it is cold outside. She knows to be  mindful of others who may be sick.She denies any issues with her breathing. States her biggest issue right now is her pain in her back. She is having more and more. She states that she is trying to monitor her activity and not over doing. Discussed backing activity and being mindful when she has done too much activity Provided written and verbal instructions on pursed lip breathing and utilized returned demonstration as teach back Provided instruction about proper use of medications used for management of COPD including inhalers Advised patient to self assesses COPD action plan zone and make appointment with provider if in the yellow zone for 48 hours without improvement Advised patient to engage in light exercise as tolerated 3-5 days a week to aid in the the management of COPD Provided education about and advised patient to utilize infection prevention strategies to reduce risk of respiratory infection. Review of being safe and monitoring for others who may have upper respiratory infections and to be mindful of the risk of infection and protecting self. The patient denies any acute changes in her COPD.  Discussed the importance of adequate rest and management of fatigue with COPD Screening for signs and symptoms of depression related to chronic disease state  Assessed social determinant of health barriers  Symptom Management: Take medications as prescribed   Attend all scheduled provider appointments Call provider office for new concerns or questions  call the Suicide  and Crisis Lifeline: 988 call the [place] National Suicide Prevention Lifeline: 508-228-3833 or TTY: (340)348-9126 TTY 7578770978) to talk to a trained counselor call 1-800-273-TALK (toll free, 24 hour hotline) if experiencing a [place] or [place]  avoid second hand smoke eliminate smoking in my home identify and remove indoor air pollutants limit outdoor activity during cold weather listen for public air  quality announcements every day do breathing exercises every day begin a symptom diary develop a rescue plan eliminate symptom triggers at home follow rescue plan if symptoms flare-up  Follow Up Plan: Telephone follow up appointment with care management team member scheduled for: 01-26-2023 at 0945 am          Patient verbalizes understanding of instructions and care plan provided today and agrees to view in [place]. Active MyChart status and patient understanding of how to access instructions and care plan via MyChart confirmed with patient.     Telephone follow up appointment with care management team member scheduled for: 01-26-2023 at 0945 am

## 2022-12-15 NOTE — Chronic Care Management (AMB) (Signed)
Chronic Care Management   CCM RN Visit Note  12/15/2022 Name: [name] MRN: [id number] DOB: [birth year]  Subjective: [name] is a 82 y.o. year old female who is a primary care patient of [name], NP. The patient was referred to the Chronic Care Management team for assistance with care management needs subsequent to provider initiation of CCM services and plan of care.    Today's Visit:  Engaged with patient by telephone for follow up visit.        Goals Addressed             This Visit's Progress    CCM Expected Outcome:  Monitor, Self-Manage, and Reduce Symptoms of Hypertension       Current Barriers:  Knowledge Deficits related to the need to check her blood pressures on a regular basis to effectively manage HTN in the home setting Care Coordination needs related to ongoing support and education on monitoring changes in HTN or hearth health in a patient with HTN Chronic Disease Management support and education needs related to effective management of HTN Cognitive Deficits BP Readings from Last 3 Encounters:  12/01/22 136/74  10/17/22 126/82  09/26/22 128/66     Planned Interventions: Evaluation of current treatment plan related to hypertension self management and patient's adherence to plan as established by provider. The patient saw the pcp on 12-01-2022. Denies any acute changes in HTN or heart health. States she feels like she is doing well. Knows to call the provider for changes or new needs. ;   Provided education to patient re: stroke prevention, s/s of heart attack and stroke; Reviewed prescribed diet the patient reminded to follow a heart healthy diet for effective management of her HTN and HLD. The patient states she is eating well, sometimes too much. The patient states that she is monitoring her sodium content and mindful of foods high in sodium and fats.  Reviewed medications with patient and discussed importance of compliance. The patient  verbalized compliance with medications. Works with the [place] [facility] on a regular basis for ongoing support and education. Reminder of appointment coming up in February to speak with the pharm D.  Discussed plans with patient for ongoing care management follow up and provided patient with direct contact information for care management team; Advised patient, providing education and rationale, to monitor blood pressure daily and record, calling PCP for findings outside established parameters. The patient continues to not take her blood pressures at home. She states she is fine and knows when her blood pressure is up because she has a headache. Review of the benefits of checking blood pressures on a regular basis. ;  Reviewed scheduled/upcoming provider appointments including: Saw the pcp on 12-01-2022. Next appointment on 03-27-2023 unless the patient needs to be seen sooner. Knows to call for changes or needs.  Advised patient to discuss changes in her HTN or heart health with provider; Provided education on prescribed diet heart healthy;  Discussed complications of poorly controlled blood pressure such as heart disease, stroke, circulatory complications, vision complications, kidney impairment, sexual dysfunction;  Screening for signs and symptoms of depression related to chronic disease state;  Assessed social determinant of health barriers;  Discussed ways to help her back pain and discomfort. She has not used heat but discussed trying heat application to her back when she is having pain  Symptom Management: Take medications as prescribed   Attend all scheduled provider appointments Call provider office for new concerns or questions  call the Suicide and Crisis Lifeline: 988 call the [place] National Suicide Prevention Lifeline: 661-202-5482 or TTY: 4581880004 TTY (303)415-3503) to talk to a trained counselor call 1-800-273-TALK (toll free, 24 hour hotline) if experiencing a Mental Health or [place]  check blood pressure 3 times per week call doctor for signs and symptoms of high blood pressure develop an action plan for high blood pressure keep all doctor appointments take medications for blood pressure exactly as prescribed report new symptoms to your doctor  Follow Up Plan: Telephone follow up appointment with care management team member scheduled for: 01-26-2023 at 0945 am       CCM:  Maintain, Monitor and Self-Manage Symptoms of COPD       Current Barriers:  Care Coordination needs related to ongoing support and education  in a patient with COPD Chronic Disease Management support and education needs related to the effective management of COPD Cognitive Deficits  Planned Interventions: Provided patient with basic written and verbal COPD education on self care/management/and exacerbation prevention. The patient states she is doing well. Denies any new changes in her breathing. She continues to smoke. Is not interested in smoking cessation. Sees provider on a regular basis.  Advised patient to track and manage COPD triggers. Review of triggers that can cause exacerbation. The patient stays in when it is cold outside. She knows to be mindful of others who may be sick.She denies any issues with her breathing. States her biggest issue right now is her pain in her back. She is having more and more. She states that she is trying to monitor her activity and not over doing. Discussed backing activity and being mindful when she has done too much activity Provided written and verbal instructions on pursed lip breathing and utilized returned demonstration as teach back Provided instruction about proper use of medications used for management of COPD including inhalers Advised patient to self assesses COPD action plan zone and make appointment with provider if in the yellow zone for 48 hours without improvement Advised patient to engage in light exercise as tolerated 3-5 days a week to  aid in the the management of COPD Provided education about and advised patient to utilize infection prevention strategies to reduce risk of respiratory infection. Review of being safe and monitoring for others who may have upper respiratory infections and to be mindful of the risk of infection and protecting self. The patient denies any acute changes in her COPD.  Discussed the importance of adequate rest and management of fatigue with COPD Screening for signs and symptoms of depression related to chronic disease state  Assessed social determinant of health barriers  Symptom Management: Take medications as prescribed   Attend all scheduled provider appointments Call provider office for new concerns or questions  call the Suicide and Crisis Lifeline: 988 call the [place] National Suicide Prevention Lifeline: 732-529-6329 or TTY: 3023524191 TTY (813)482-1472) to talk to a trained counselor call 1-800-273-TALK (toll free, 24 hour hotline) if experiencing a Mental Health or [place]  avoid second hand smoke eliminate smoking in my home identify and remove indoor air pollutants limit outdoor activity during cold weather listen for public air quality announcements every day do breathing exercises every day begin a symptom diary develop a rescue plan eliminate symptom triggers at home follow rescue plan if symptoms flare-up  Follow Up Plan: Telephone follow up appointment with care management team member scheduled for: 01-26-2023 at 0945 am  Plan:Telephone follow up appointment with care management team member scheduled for:  01-26-2023 at [place] am  Noreene Larsson RN, MSN, CCM RN Care Manager  Chronic Care Management Direct Number: [phone]

## 2022-12-21 DIAGNOSIS — I10 Essential (primary) hypertension: Secondary | ICD-10-CM

## 2022-12-21 DIAGNOSIS — F17210 Nicotine dependence, cigarettes, uncomplicated: Secondary | ICD-10-CM

## 2022-12-21 DIAGNOSIS — J449 Chronic obstructive pulmonary disease, unspecified: Secondary | ICD-10-CM

## 2022-12-26 ENCOUNTER — Ambulatory Visit (INDEPENDENT_AMBULATORY_CARE_PROVIDER_SITE_OTHER): Payer: PPO

## 2022-12-26 VITALS — Wt 166.0 lb

## 2022-12-26 DIAGNOSIS — Z0000 Encounter for general adult medical examination without abnormal findings: Secondary | ICD-10-CM

## 2022-12-26 NOTE — Patient Instructions (Signed)
[name] , Thank you for taking time to come for your Medicare Wellness Visit. I appreciate your ongoing commitment to your health goals. Please review the following plan we discussed and let me know if I can assist you in the future.   These are the goals we discussed:  Goals      CCM Expected Outcome:  Monitor, Self-Manage, and Reduce Symptoms of Hypertension     Current Barriers:  Knowledge Deficits related to the need to check her blood pressures on a regular basis to effectively manage HTN in the home setting Care Coordination needs related to ongoing support and education on monitoring changes in HTN or hearth health in a patient with HTN Chronic Disease Management support and education needs related to effective management of HTN Cognitive Deficits BP Readings from Last 3 Encounters:  12/01/22 136/74  10/17/22 126/82  09/26/22 128/66     Planned Interventions: Evaluation of current treatment plan related to hypertension self management and patient's adherence to plan as established by provider. The patient saw the pcp on 12-01-2022. Denies any acute changes in HTN or heart health. States she feels like she is doing well. Knows to call the provider for changes or new needs. ;   Provided education to patient re: stroke prevention, s/s of heart attack and stroke; Reviewed prescribed diet the patient reminded to follow a heart healthy diet for effective management of her HTN and HLD. The patient states she is eating well, sometimes too much. The patient states that she is monitoring her sodium content and mindful of foods high in sodium and fats.  Reviewed medications with patient and discussed importance of compliance. The patient verbalized compliance with medications. Works with the pharm D on a regular basis for ongoing support and education. Reminder of appointment coming up in February to speak with the pharm D.  Discussed plans with patient for ongoing care management follow up and  provided patient with direct contact information for care management team; Advised patient, providing education and rationale, to monitor blood pressure daily and record, calling PCP for findings outside established parameters. The patient continues to not take her blood pressures at home. She states she is fine and knows when her blood pressure is up because she has a headache. Review of the benefits of checking blood pressures on a regular basis. ;  Reviewed scheduled/upcoming provider appointments including: Saw the pcp on 12-01-2022. Next appointment on 03-27-2023 unless the patient needs to be seen sooner. Knows to call for changes or needs.  Advised patient to discuss changes in her HTN or heart health with provider; Provided education on prescribed diet heart healthy;  Discussed complications of poorly controlled blood pressure such as heart disease, stroke, circulatory complications, vision complications, kidney impairment, sexual dysfunction;  Screening for signs and symptoms of depression related to chronic disease state;  Assessed social determinant of health barriers;  Discussed ways to help her back pain and discomfort. She has not used heat but discussed trying heat application to her back when she is having pain  Symptom Management: Take medications as prescribed   Attend all scheduled provider appointments Call provider office for new concerns or questions  call the Suicide and Crisis Lifeline: 988 call the [place] National Suicide Prevention Lifeline: 734-311-9129 or TTY: (938)501-0022 TTY 289-825-0452) to talk to a trained counselor call 1-800-273-TALK (toll free, 24 hour hotline) if experiencing a Mental Health or Behavioral Health Crisis  check blood pressure 3 times per week call doctor for signs  and symptoms of high blood pressure develop an action plan for high blood pressure keep all doctor appointments take medications for blood pressure exactly as prescribed report new  symptoms to your doctor  Follow Up Plan: Telephone follow up appointment with care management team member scheduled for: 01-26-2023 at 0945 am       CCM:  Maintain, Monitor and Self-Manage Symptoms of COPD     Current Barriers:  Care Coordination needs related to ongoing support and education  in a patient with COPD Chronic Disease Management support and education needs related to the effective management of COPD Cognitive Deficits  Planned Interventions: Provided patient with basic written and verbal COPD education on self care/management/and exacerbation prevention. The patient states she is doing well. Denies any new changes in her breathing. She continues to smoke. Is not interested in smoking cessation. Sees provider on a regular basis.  Advised patient to track and manage COPD triggers. Review of triggers that can cause exacerbation. The patient stays in when it is cold outside. She knows to be mindful of others who may be sick.She denies any issues with her breathing. States her biggest issue right now is her pain in her back. She is having more and more. She states that she is trying to monitor her activity and not over doing. Discussed backing activity and being mindful when she has done too much activity Provided written and verbal instructions on pursed lip breathing and utilized returned demonstration as teach back Provided instruction about proper use of medications used for management of COPD including inhalers Advised patient to self assesses COPD action plan zone and make appointment with provider if in the yellow zone for 48 hours without improvement Advised patient to engage in light exercise as tolerated 3-5 days a week to aid in the the management of COPD Provided education about and advised patient to utilize infection prevention strategies to reduce risk of respiratory infection. Review of being safe and monitoring for others who may have upper respiratory infections and to be  mindful of the risk of infection and protecting self. The patient denies any acute changes in her COPD.  Discussed the importance of adequate rest and management of fatigue with COPD Screening for signs and symptoms of depression related to chronic disease state  Assessed social determinant of health barriers  Symptom Management: Take medications as prescribed   Attend all scheduled provider appointments Call provider office for new concerns or questions  call the Suicide and Crisis Lifeline: 988 call the [place] National Suicide Prevention Lifeline: (724) 095-57611-613-200-9692 or TTY: 620-420-52111-800-799-4 TTY 318 170 9876(1-224-031-8463) to talk to a trained counselor call 1-800-273-TALK (toll free, 24 hour hotline) if experiencing a Mental Health or Behavioral Health Crisis  avoid second hand smoke eliminate smoking in my home identify and remove indoor air pollutants limit outdoor activity during cold weather listen for public air quality announcements every day do breathing exercises every day begin a symptom diary develop a rescue plan eliminate symptom triggers at home follow rescue plan if symptoms flare-up  Follow Up Plan: Telephone follow up appointment with care management team member scheduled for: 01-26-2023 at 0945 am       DIET - EAT MORE FRUITS AND VEGETABLES     Have 3 meals a day     Patient Stated     06/19/2020, no goals     Pharmacy Goals     Please obtain new upper arm blood pressure monitor and restart checking your home blood pressure  We recommend  a blood pressure cuff that goes around your upper arm, as these are generally the most accurate.   To appropriately check your blood pressure, make sure you do the following:  1) Avoid caffeine, exercise, or tobacco products for 30 minutes before checking. 2) Sit with your back supported in a flat-backed chair. Rest your arm on something flat (arm of the chair, table, etc). 3) Sit still with your feet flat on the floor, resting, for at least 5  minutes.  4) Check your blood pressure. Take 1-2 readings.   Write down these readings and bring with you to any provider appointments. Bring your home blood pressure machine with you to a provider's office for accuracy comparison at least once a year. Make sure you take your blood pressure medications before you come to any office visit.   Feel free to call me with any questions or concerns. I look forward to our next call!  Estelle Grumbles, PharmD, BCACP Clinical Pharmacist [facility] [phone]     Quit Smoking        This is a list of the screening recommended for you and due dates:  Health Maintenance  Topic Date Due   DTaP/Tdap/Td vaccine (1 - Tdap) Never done   Zoster (Shingles) Vaccine (1 of 2) Never done   Screening for Lung Cancer  12/23/2008   Colon Cancer Screening  04/23/2022   COVID-19 Vaccine (5 - 2023-24 season) 05/23/2022   Flu Shot  04/23/2023   Medicare Annual Wellness Visit  12/26/2023   Pneumonia Vaccine  Completed   DEXA scan (bone density measurement)  Completed   HPV Vaccine  Aged Out    Advanced directives: no  Conditions/risks identified: none  Next appointment: Follow up in one year for your annual wellness visit 12/31/13 @ 11:15 am by phone   Preventive Care 65 Years and Older, Female Preventive care refers to lifestyle choices and visits with your health care provider that can promote health and wellness. What does preventive care include? A yearly physical exam. This is also called an annual well check. Dental exams once or twice a year. Routine eye exams. Ask your health care provider how often you should have your eyes checked. Personal lifestyle choices, including: Daily care of your teeth and gums. Regular physical activity. Eating a healthy diet. Avoiding tobacco and drug use. Limiting alcohol use. Practicing safe sex. Taking low-dose aspirin every day. Taking vitamin and mineral supplements as  recommended by your health care provider. What happens during an annual well check? The services and screenings done by your health care provider during your annual well check will depend on your age, overall health, lifestyle risk factors, and family history of disease. Counseling  Your health care provider may ask you questions about your: Alcohol use. Tobacco use. Drug use. Emotional well-being. Home and relationship well-being. Sexual activity. Eating habits. History of falls. Memory and ability to understand (cognition). Work and work Astronomer. Reproductive health. Screening  You may have the following tests or measurements: Height, weight, and BMI. Blood pressure. Lipid and cholesterol levels. These may be checked every 5 years, or more frequently if you are over 58 years old. Skin check. Lung cancer screening. You may have this screening every year starting at age 5 if you have a 30-pack-year history of smoking and currently smoke or have quit within the past 15 years. Fecal occult blood test (FOBT) of the stool. You may have this test every year starting at age  50. Flexible sigmoidoscopy or colonoscopy. You may have a sigmoidoscopy every 5 years or a colonoscopy every 10 years starting at age 81. Hepatitis C blood test. Hepatitis B blood test. Sexually transmitted disease (STD) testing. Diabetes screening. This is done by checking your blood sugar (glucose) after you have not eaten for a while (fasting). You may have this done every 1-3 years. Bone density scan. This is done to screen for osteoporosis. You may have this done starting at age 81. Mammogram. This may be done every 1-2 years. Talk to your health care provider about how often you should have regular mammograms. Talk with your health care provider about your test results, treatment options, and if necessary, the need for more tests. Vaccines  Your health care provider may recommend certain vaccines, such  as: Influenza vaccine. This is recommended every year. Tetanus, diphtheria, and acellular pertussis (Tdap, Td) vaccine. You may need a Td booster every 10 years. Zoster vaccine. You may need this after age 81. Pneumococcal 13-valent conjugate (PCV13) vaccine. One dose is recommended after age 81. Pneumococcal polysaccharide (PPSV23) vaccine. One dose is recommended after age 81. Talk to your health care provider about which screenings and vaccines you need and how often you need them. This information is not intended to replace advice given to you by your health care provider. Make sure you discuss any questions you have with your health care provider. Document Released: 10/05/2015 Document Revised: 05/28/2016 Document Reviewed: 07/10/2015 Elsevier Interactive Patient Education  2017 [employer]  Fall Prevention in the Home Falls can cause injuries. They can happen to people of all ages. There are many things you can do to make your home safe and to help prevent falls. What can I do on the outside of my home? Regularly fix the edges of walkways and driveways and fix any cracks. Remove anything that might make you trip as you walk through a door, such as a raised step or threshold. Trim any bushes or trees on the path to your home. Use bright outdoor lighting. Clear any walking paths of anything that might make someone trip, such as rocks or tools. Regularly check to see if handrails are loose or broken. Make sure that both sides of any steps have handrails. Any raised decks and porches should have guardrails on the edges. Have any leaves, snow, or ice cleared regularly. Use sand or salt on walking paths during winter. Clean up any spills in your garage right away. This includes oil or grease spills. What can I do in the bathroom? Use night lights. Install grab bars by the toilet and in the tub and shower. Do not use towel bars as grab bars. Use non-skid mats or decals in the tub or  shower. If you need to sit down in the shower, use a plastic, non-slip stool. Keep the floor dry. Clean up any water that spills on the floor as soon as it happens. Remove soap buildup in the tub or shower regularly. Attach bath mats securely with double-sided non-slip rug tape. Do not have throw rugs and other things on the floor that can make you trip. What can I do in the bedroom? Use night lights. Make sure that you have a light by your bed that is easy to reach. Do not use any sheets or blankets that are too big for your bed. They should not hang down onto the floor. Have a firm chair that has side arms. You can use this for support while you  get dressed. Do not have throw rugs and other things on the floor that can make you trip. What can I do in the kitchen? Clean up any spills right away. Avoid walking on wet floors. Keep items that you use a lot in easy-to-reach places. If you need to reach something above you, use a strong step stool that has a grab bar. Keep electrical cords out of the way. Do not use floor polish or wax that makes floors slippery. If you must use wax, use non-skid floor wax. Do not have throw rugs and other things on the floor that can make you trip. What can I do with my stairs? Do not leave any items on the stairs. Make sure that there are handrails on both sides of the stairs and use them. Fix handrails that are broken or loose. Make sure that handrails are as long as the stairways. Check any carpeting to make sure that it is firmly attached to the stairs. Fix any carpet that is loose or worn. Avoid having throw rugs at the top or bottom of the stairs. If you do have throw rugs, attach them to the floor with carpet tape. Make sure that you have a light switch at the top of the stairs and the bottom of the stairs. If you do not have them, ask someone to add them for you. What else can I do to help prevent falls? Wear shoes that: Do not have high heels. Have  rubber bottoms. Are comfortable and fit you well. Are closed at the toe. Do not wear sandals. If you use a stepladder: Make sure that it is fully opened. Do not climb a closed stepladder. Make sure that both sides of the stepladder are locked into place. Ask someone to hold it for you, if possible. Clearly mark and make sure that you can see: Any grab bars or handrails. First and last steps. Where the edge of each step is. Use tools that help you move around (mobility aids) if they are needed. These include: Canes. Walkers. Scooters. Crutches. Turn on the lights when you go into a dark area. Replace any light bulbs as soon as they burn out. Set up your furniture so you have a clear path. Avoid moving your furniture around. If any of your floors are uneven, fix them. If there are any pets around you, be aware of where they are. Review your medicines with your doctor. Some medicines can make you feel dizzy. This can increase your chance of falling. Ask your doctor what other things that you can do to help prevent falls. This information is not intended to replace advice given to you by your health care provider. Make sure you discuss any questions you have with your health care provider. Document Released: 07/05/2009 Document Revised: 02/14/2016 Document Reviewed: 10/13/2014 Elsevier Interactive Patient Education  2017 [employer].

## 2022-12-26 NOTE — Progress Notes (Signed)
I connected with  [name] on 12/26/22 by a audio enabled telemedicine application and verified that I am speaking with the correct person using two identifiers.  Patient Location: Home  Provider Location: Office/Clinic  I discussed the limitations of evaluation and management by telemedicine. The patient expressed understanding and agreed to proceed.  Subjective:   [name] is a 82 y.o. female who presents for Medicare Annual (Subsequent) preventive examination.  Review of Systems     Cardiac Risk Factors include: advanced age (>6455men, 73>65 women);dyslipidemia;hypertension;smoking/ tobacco exposure     Objective:    There were no vitals filed for this visit. There is no height or weight on file to calculate BMI.     12/26/2022    2:38 PM 07/28/2022    6:28 AM 07/18/2022    9:06 AM 02/11/2022    1:29 PM 08/12/2021    1:53 PM 04/23/2021    7:46 AM 06/19/2020    1:22 PM  Advanced Directives  Does Patient Have a Medical Advance Directive? No Yes Yes Yes Yes Yes Yes  Type of Furniture conservator/restorerAdvance Directive  Healthcare Power of Attorney;Living will  Healthcare Power of [place];Living will Healthcare Power of [place];Living will  Healthcare Power of [place];Living will  Does patient want to make changes to medical advance directive?  No - Patient declined       Copy of Healthcare Power of Attorney in Chart?  No - copy requested     No - copy requested  Would patient like information on creating a medical advance directive? No - Patient declined          Current Medications (verified) Outpatient Encounter Medications as of 12/26/2022  Medication Sig   acetaminophen (TYLENOL) 500 MG tablet Take 500 mg by mouth every 6 (six) hours as needed for moderate pain.    amLODipine (NORVASC) 10 MG tablet TAKE 1 TABLET(10 MG) BY MOUTH DAILY   aspirin EC 81 MG tablet Take 81 mg by mouth daily. Swallow whole.   atorvastatin (LIPITOR) 40 MG tablet Take 1 tablet (40 mg total) by mouth daily.    citalopram (CELEXA) 20 MG tablet TAKE 1 TABLET(20 MG) BY MOUTH DAILY   levothyroxine (SYNTHROID) 175 MCG tablet Take 1 tablet (175 mcg total) by mouth daily.   losartan (COZAAR) 100 MG tablet TAKE 1 TABLET(100 MG) BY MOUTH DAILY   metoprolol succinate (TOPROL-XL) 50 MG 24 hr tablet TAKE 1 TABLET BY MOUTH EVERY DAY WITH OR IMMEDIATELY FOLLOWING A MEAL   Multiple Vitamin (MULTIVITAMIN) capsule Take 1 capsule by mouth daily.   rOPINIRole (REQUIP) 0.5 MG tablet TAKE 1 TABLET(0.5 MG) BY MOUTH AT BEDTIME   traZODone (DESYREL) 50 MG tablet TAKE 1 TABLET(50 MG) BY MOUTH AT BEDTIME AS NEEDED FOR SLEEP   triamcinolone cream (KENALOG) 0.1 % Apply 1 Application topically 2 (two) times daily.   No facility-administered encounter medications on file as of 12/26/2022.    Allergies (verified) Codeine, Morphine, Ace inhibitors, and Adhesive [tape]   History: Past Medical History:  Diagnosis Date   Allergy    Anemia    Anxiety    Arthritis    Cancer    skin cancer   Hyperlipidemia    Hypertension    Hypothyroidism    Sciatica    Sleep apnea    does not use cpap   Thrombocytopenia    Thyroid disease    Past Surgical History:  Procedure Laterality Date   ABDOMINAL HYSTERECTOMY     APPENDECTOMY  BACK SURGERY     lower   BILATERAL CARPAL TUNNEL RELEASE     BREAST CYST ASPIRATION Left    neg   COLONOSCOPY     COLONOSCOPY WITH PROPOFOL N/A 04/23/2021   Procedure: COLONOSCOPY WITH PROPOFOL;  Surgeon: Pasty Spillers, MD;  Location: [facility];  Service: Endoscopy;  Laterality: N/A;   ENTROPIAN REPAIR Left 06/08/2020   Procedure: ENTROPION REPAIR, SUTURES ENTROPION REPAIR, EXTENSIVE LEFT;  Surgeon: Imagene Riches, MD;  Location: [facility];  Service: Ophthalmology;  Laterality: Left;   JOINT REPLACEMENT     knees   KNEE SURGERY Bilateral    KNEE SURGERY     OPEN REDUCTION INTERNAL FIXATION (ORIF) DISTAL RADIAL FRACTURE Left 06/23/2019   Procedure: OPEN REDUCTION INTERNAL  FIXATION (ORIF) DISTAL RADIAL FRACTURE;  Surgeon: Kennedy Bucker, MD;  Location: [facility];  Service: Orthopedics;  Laterality: Left;   PARATHYROIDECTOMY  04/27/2020   salivary stone remove     SPINAL CORD STIMULATOR IMPLANT Right    SPINAL CORD STIMULATOR REMOVAL N/A 07/28/2022   Procedure: REMOVAL OF SPINAL CORD STIMULATOR;  Surgeon: Venetia Night, MD;  Location: [facility];  Service: Neurosurgery;  Laterality: N/A;   THYROIDECTOMY Right 04/27/2020   Procedure: PARATHYROIDECTOMY;  Surgeon: Luretha Murphy, MD;  Location: [facility];  Service: General;  Laterality: Right;   TOTAL KNEE REVISION Right 08/13/2015   Procedure: TOTAL KNEE REVISION;  Surgeon: Durene Romans, MD;  Location: [facility];  Service: Orthopedics;  Laterality: Right;   Family History  Problem Relation Age of Onset   Dementia Mother    Dementia Sister    Heart attack Brother    Diabetes Brother    Breast cancer Paternal Aunt    Heart attack Son    Hypercalcemia Neg Hx    Cancer Neg Hx    Social History   Socioeconomic History   Marital status: Married    Spouse name: ROBERT   Number of children: 4   Years of education: Not on file   Highest education level: Not on file  Occupational History   Occupation: retired   Occupation: Retired  Tobacco Use   Smoking status: Every Day    Packs/day: 0.50    Years: 40.00    Additional pack years: 0.00    Total pack years: 20.00    Types: Cigarettes   Smokeless tobacco: Never  Vaping Use   Vaping Use: Never used  Substance and Sexual Activity   Alcohol use: No   Drug use: No   Sexual activity: Not Currently  Other Topics Concern   Not on file  Social History Narrative   Not on file   Social Determinants of Health   Financial Resource Strain: Low Risk  (12/26/2022)   Overall Financial Resource Strain (CARDIA)    Difficulty of Paying Living Expenses: Not hard at all  Food Insecurity: No Food Insecurity (12/26/2022)   Hunger Vital Sign    Worried About Running Out of  Food in the Last Year: Never true    Ran Out of Food in the Last Year: Never true  Transportation Needs: No Transportation Needs (12/26/2022)   PRAPARE - Administrator, Civil Service (Medical): No    Lack of Transportation (Non-Medical): No  Physical Activity: Sufficiently Active (12/26/2022)   Exercise Vital Sign    Days of Exercise per Week: 5 days    Minutes of Exercise per Session: 30 min  Stress: No Stress Concern Present (12/26/2022)   [employer] of  Occupational Health - Occupational Stress Questionnaire    Feeling of Stress : Not at all  Social Connections: Moderately Isolated (12/26/2022)   Social Connection and Isolation Panel [NHANES]    Frequency of Communication with Friends and Family: Once a week    Frequency of Social Gatherings with Friends and Family: Once a week    Attends Religious Services: More than 4 times per year    Active Member of [employer] or Organizations: No    Attends Engineer, structural: Never    Marital Status: Married    Tobacco Counseling Ready to quit: Not Answered Counseling given: Not Answered   Clinical Intake:  Pre-visit preparation completed: Yes        Nutritional Risks: None Diabetes: No  How often do you need to have someone help you when you read instructions, pamphlets, or other written materials from your doctor or pharmacy?: 1 - Never  Diabetic?no  Interpreter Needed?: No  Information entered by :: Kennedy Bucker, LPN   Activities of Daily Living    12/26/2022    2:40 PM 12/24/2022    9:32 AM  In your present state of health, do you have any difficulty performing the following activities:  Hearing? 0 0  Vision? 0   Difficulty concentrating or making decisions? 0 1  Walking or climbing stairs? 0 1  Dressing or bathing? 0 0  Doing errands, shopping? 1 0  Preparing Food and eating ? N N  Using the Toilet? N N  In the past six months, have you accidently leaked urine? Y Y  Do you have problems with  loss of bowel control? N N  Managing your Medications? N N  Managing your Finances? N N  Housekeeping or managing your Housekeeping? Y Y    Patient Care Team: [name], NP as PCP - General (Internal Medicine) Marlowe Sax, RN as Registered Nurse (General Practice) Ronney Asters Jackelyn Poling, RPH-CPP as Pharmacist Earna Coder, MD as Consulting Physician (Oncology)  Indicate any recent Medical Services you may have received from other than [facility] providers in the past year (date may be approximate).     Assessment:   This is a routine wellness examination for Avaeh.  Hearing/Vision screen Hearing Screening - Comments:: No aids Vision Screening - Comments:: Readers-   Dietary issues and exercise activities discussed: Current Exercise Habits: Home exercise routine, Type of exercise: walking, Time (Minutes): 30, Frequency (Times/Week): 5, Weekly Exercise (Minutes/Week): 150, Intensity: Mild   Goals Addressed             This Visit's Progress    DIET - EAT MORE FRUITS AND VEGETABLES         Depression Screen    12/26/2022    2:37 PM 12/01/2022    9:38 AM 08/27/2022   11:05 AM 12/16/2021    1:50 PM 11/26/2021    2:34 PM 10/23/2021    9:35 AM 08/20/2021    8:06 AM  PHQ 2/9 Scores  PHQ - 2 Score 0 2 0 1 3 1 2   PHQ- 9 Score 0 5  4 6 4 4     Fall Risk    12/26/2022    2:39 PM 12/24/2022    9:32 AM 12/01/2022    9:38 AM 08/27/2022   11:05 AM 08/11/2022   10:17 AM  Fall Risk   Falls in the past year? 0 1 0 0 0  Number falls in past yr: 0 1   0  Injury with Fall?  0 0 0 0 0  Risk for fall due to : No Fall Risks  No Fall Risks  Impaired balance/gait  Follow up Falls prevention discussed;Falls evaluation completed    Falls evaluation completed;Education provided;Falls prevention discussed    FALL RISK PREVENTION PERTAINING TO THE HOME:  Any stairs in or around the home? No  If so, are there any without handrails? No  Home free of loose throw rugs in walkways, pet  beds, electrical cords, etc? Yes  Adequate lighting in your home to reduce risk of falls? Yes   ASSISTIVE DEVICES UTILIZED TO PREVENT FALLS:  Life alert? No  Use of a cane, walker or w/c? No  Grab bars in the bathroom? Yes  Shower chair or bench in shower? Yes  Elevated toilet seat or a handicapped toilet? Yes    Cognitive Function:        12/26/2022    2:44 PM 12/16/2021    1:52 PM 06/19/2020    1:26 PM  6CIT Screen  What Year? 0 points 0 points 0 points  What month? 0 points 0 points 0 points  What time? 0 points 0 points 0 points  Count back from 20 0 points 2 points 0 points  Months in reverse 4 points 2 points 0 points  Repeat phrase 10 points 10 points 0 points  Total Score 14 points 14 points 0 points    Immunizations Immunization History  Administered Date(s) Administered   Fluad Quad(high Dose 65+) 06/06/2019, 06/10/2021, 06/23/2022   Influenza, High Dose Seasonal PF 06/04/2017, 06/29/2018   Influenza-Unspecified 04/22/2020   PFIZER(Purple Top)SARS-COV-2 Vaccination 10/31/2019, 11/28/2019, 07/03/2020   Pfizer Covid-19 Vaccine Bivalent Booster 27yrs & up 08/10/2021   Pneumococcal Conjugate-13 08/05/2018   Pneumococcal Polysaccharide-23 06/04/2017, 06/08/2020    TDAP status: Due, Education has been provided regarding the importance of this vaccine. Advised may receive this vaccine at local pharmacy or Health Dept. Aware to provide a copy of the vaccination record if obtained from local pharmacy or Health Dept. Verbalized acceptance and understanding.  Flu Vaccine status: Up to date  Pneumococcal vaccine status: Up to date  Covid-19 vaccine status: Completed vaccines  Qualifies for Shingles Vaccine? Yes   Zostavax completed No   Shingrix Completed?: No.    Education has been provided regarding the importance of this vaccine. Patient has been advised to call insurance company to determine out of pocket expense if they have not yet received this vaccine. Advised  may also receive vaccine at local pharmacy or Health Dept. Verbalized acceptance and understanding.  Screening Tests Health Maintenance  Topic Date Due   DTaP/Tdap/Td (1 - Tdap) Never done   Zoster Vaccines- Shingrix (1 of 2) Never done   Lung Cancer Screening  12/23/2008   COLONOSCOPY (Pts 45-37yrs Insurance coverage will need to be confirmed)  04/23/2022   COVID-19 Vaccine (5 - 2023-24 season) 05/23/2022   INFLUENZA VACCINE  04/23/2023   Medicare Annual Wellness (AWV)  12/26/2023   Pneumonia Vaccine 44+ Years old  Completed   DEXA SCAN  Completed   HPV VACCINES  Aged Out    Health Maintenance  Health Maintenance Due  Topic Date Due   DTaP/Tdap/Td (1 - Tdap) Never done   Zoster Vaccines- Shingrix (1 of 2) Never done   Lung Cancer Screening  12/23/2008   COLONOSCOPY (Pts 45-70yrs Insurance coverage will need to be confirmed)  04/23/2022   COVID-19 Vaccine (5 - 2023-24 season) 05/23/2022    Colorectal cancer screening: No longer required.  Mammogram status: No longer required due to age.  Bone Density status: Completed 12/24/21. Results reflect: Bone density results: OSTEOPENIA. Repeat every 5 years.  Lung Cancer Screening: (Low Dose CT Chest recommended if Age 75-80 years, 30 pack-year currently smoking OR have quit w/in 15years.) does not qualify. - smokes 3-4 cigs/day    Additional Screening:  Hepatitis C Screening: does not qualify; Completed no  Vision Screening: Recommended annual ophthalmology exams for early detection of glaucoma and other disorders of the eye. Is the patient up to date with their annual eye exam?  No  Who is the provider or what is the name of the office in which the patient attends annual eye exams? No one If pt is not established with a provider, would they like to be referred to a provider to establish care? No .   Dental Screening: Recommended annual dental exams for proper oral hygiene  Community Resource Referral / Chronic Care  Management: CRR required this visit?  No   CCM required this visit?  No      Plan:     I have personally reviewed and noted the following in the patient's chart:   Medical and social history Use of alcohol, tobacco or illicit drugs  Current medications and supplements including opioid prescriptions. Patient is not currently taking opioid prescriptions. Functional ability and status Nutritional status Physical activity Advanced directives List of other physicians Hospitalizations, surgeries, and ER visits in previous 12 months Vitals Screenings to include cognitive, depression, and falls Referrals and appointments  In addition, I have reviewed and discussed with patient certain preventive protocols, quality metrics, and best practice recommendations. A written personalized care plan for preventive services as well as general preventive health recommendations were provided to patient.     Hal HopeLorrie S Courtny Bennison, LPN   9/1/47824/01/2023   Nurse Notes: none

## 2022-12-31 ENCOUNTER — Ambulatory Visit: Payer: Self-pay

## 2022-12-31 NOTE — Telephone Encounter (Signed)
    Chief Complaint: Low back pain. " I think [name] has treated me for this before." Hurts with certain movement. Asking to be worked in or "something for pain."  Symptoms: Pain Frequency: Yesterday Pertinent Negatives: Patient denies any radiation of pain Disposition: [] ED /[] Urgent Care (no appt availability in office) / [] Appointment(In office/virtual)/ []  [facility]/ [] Home Care/ [] Refused Recommended Disposition /[] [facility]/ [x]  Follow-up with PCP Additional Notes: No availability, please advise pt.  Answer Assessment - Initial Assessment Questions 1. ONSET: "When did the pain begin?"      Yesterday 2. LOCATION: "Where does it hurt?" (upper, mid or lower back)     Low back  3. SEVERITY: "How bad is the pain?"  (e.g., Scale 1-10; mild, moderate, or severe)   - MILD (1-3): Doesn't interfere with normal activities.    - MODERATE (4-7): Interferes with normal activities or awakens from sleep.    - SEVERE (8-10): Excruciating pain, unable to do any normal activities.      10 4. PATTERN: "Is the pain constant?" (e.g., yes, no; constant, intermittent)      Constant 5. RADIATION: "Does the pain shoot into your legs or somewhere else?"     No 6. CAUSE:  "What do you think is causing the back pain?"      No 7. BACK OVERUSE:  "Any recent lifting of heavy objects, strenuous work or exercise?"     No 8. MEDICINES: "What have you taken so far for the pain?" (e.g., nothing, acetaminophen, NSAIDS)     Tylenol 9. NEUROLOGIC SYMPTOMS: "Do you have any weakness, numbness, or problems with bowel/bladder control?"     No 10. OTHER SYMPTOMS: "Do you have any other symptoms?" (e.g., fever, abdomen pain, burning with urination, blood in urine)       No 11. PREGNANCY: "Is there any chance you are pregnant?" "When was your last menstrual period?"       No  Protocols used: Back Pain-A-AH

## 2023-01-01 NOTE — Telephone Encounter (Signed)
Pt advised.  She says she is feeling better today but will schedule an appointment if her back starts to hurt again.   Thanks,   -Vernona Rieger

## 2023-01-01 NOTE — Telephone Encounter (Signed)
She would need to be seen but you can recommend alternating heat, ice, stretching and Tylenol 650 mg every 8 hours

## 2023-01-02 ENCOUNTER — Other Ambulatory Visit: Payer: Self-pay | Admitting: Internal Medicine

## 2023-01-02 DIAGNOSIS — F5104 Psychophysiologic insomnia: Secondary | ICD-10-CM

## 2023-01-05 NOTE — Telephone Encounter (Signed)
Requested Prescriptions  Pending Prescriptions Disp Refills   traZODone (DESYREL) 50 MG tablet [Pharmacy Med Name: TRAZODONE 50MG  TABLETS] 90 tablet 0    Sig: TAKE 1 TABLET(50 MG) BY MOUTH AT BEDTIME AS NEEDED FOR SLEEP     Psychiatry: Antidepressants - Serotonin Modulator Passed - 01/02/2023  2:51 PM      Passed - Valid encounter within last 6 months    Recent Outpatient Visits           1 month ago Other eczema   [place] [facility] [place], Salvadore Oxford, NP   2 months ago Acquired hypothyroidism   [place] [facility] [place], Salvadore Oxford, NP   3 months ago Pure hypercholesterolemia   [place] [facility] [place], Salvadore Oxford, NP   4 months ago Throat discomfort   [place] [facility] [place], Salvadore Oxford, NP   6 months ago Dizziness   [place] [facility] [place], Salvadore Oxford, NP       Future Appointments             In 2 months Baity, Salvadore Oxford, NP [place] [facility], [facility]

## 2023-01-12 ENCOUNTER — Other Ambulatory Visit: Payer: Self-pay | Admitting: Internal Medicine

## 2023-01-12 DIAGNOSIS — F5104 Psychophysiologic insomnia: Secondary | ICD-10-CM

## 2023-01-13 NOTE — Telephone Encounter (Signed)
Requested Prescriptions  Pending Prescriptions Disp Refills   traZODone (DESYREL) 50 MG tablet [Pharmacy Med Name: TRAZODONE  TABLETS] 90 tablet 0    Sig: TAKE 1 TABLET(50 MG) BY MOUTH AT BEDTIME AS NEEDED FOR SLEEP     Psychiatry: Antidepressants - Serotonin Modulator Passed - 01/12/2023 12:56 PM      Passed - Valid encounter within last 6 months    Recent Outpatient Visits           1 month ago Other eczema   [place] [facility] [place], Salvadore Oxford, NP   2 months ago Acquired hypothyroidism   [place] [facility] [place], Salvadore Oxford, NP   3 months ago Pure hypercholesterolemia   [place] [facility] [place], Salvadore Oxford, NP   4 months ago Throat discomfort   [place] [facility] [place], Salvadore Oxford, NP   6 months ago Dizziness   [place] [facility] [place], Salvadore Oxford, NP       Future Appointments             In 2 months Baity, Salvadore Oxford, NP [facility]             levothyroxine (SYNTHROID) 175 MCG tablet [Pharmacy Med Name: LEVOTHYROXINE 0.175MG  ( ) TABS] 90 tablet 0    Sig: TAKE 1 TABLET(175 MCG) BY MOUTH DAILY     Endocrinology:  Hypothyroid Agents Failed - 01/12/2023 12:56 PM      Failed - TSH in normal range and within 360 days    TSH  Date Value Ref Range Status  09/26/2022 8.84 (H) 0.40 - 4.50 mIU/L Final         Passed - Valid encounter within last 12 months    Recent Outpatient Visits           1 month ago Other eczema   [place] [facility] [place], Salvadore Oxford, NP   2 months ago Acquired hypothyroidism   [place] [facility] [place], Salvadore Oxford, NP   3 months ago Pure hypercholesterolemia   [place] [facility] [place], Salvadore Oxford, NP   4 months ago Throat discomfort   [place] [facility], Salvadore Oxford, NP   6 months ago Dizziness   [place] [facility] [place], Salvadore Oxford, NP       Future Appointments             In 2 months Baity, Salvadore Oxford, NP  [facility]

## 2023-01-23 ENCOUNTER — Other Ambulatory Visit: Payer: Self-pay | Admitting: Internal Medicine

## 2023-01-23 DIAGNOSIS — I10 Essential (primary) hypertension: Secondary | ICD-10-CM

## 2023-01-23 NOTE — Telephone Encounter (Signed)
Requested Prescriptions  Pending Prescriptions Disp Refills   amLODipine (NORVASC) 10 MG tablet [Pharmacy Med Name: AMLODIPINE BESYLATE 10MG  TABLETS] 90 tablet 0    Sig: TAKE 1 TABLET(10 MG) BY MOUTH DAILY     Cardiovascular: Calcium Channel Blockers 2 Passed - 01/23/2023  9:01 AM      Passed - Last BP in normal range    BP Readings from Last 1 Encounters:  12/01/22 136/74         Passed - Last Heart Rate in normal range    Pulse Readings from Last 1 Encounters:  12/01/22 (!) 56         Passed - Valid encounter within last 6 months    Recent Outpatient Visits           1 month ago Other eczema   [place] [facility] [place], Salvadore Oxford, NP   3 months ago Acquired hypothyroidism   [place] [facility] [place], Salvadore Oxford, NP   3 months ago Pure hypercholesterolemia   [place] [facility] [place], Salvadore Oxford, NP   4 months ago Throat discomfort   [place] [facility] [place], Salvadore Oxford, NP   7 months ago Dizziness   [place] [facility] [place], Salvadore Oxford, NP       Future Appointments             In 2 months Baity, Salvadore Oxford, NP  [facility]

## 2023-01-26 ENCOUNTER — Telehealth: Payer: PPO

## 2023-01-26 ENCOUNTER — Ambulatory Visit (INDEPENDENT_AMBULATORY_CARE_PROVIDER_SITE_OTHER): Payer: PPO

## 2023-01-26 DIAGNOSIS — J4489 Other specified chronic obstructive pulmonary disease: Secondary | ICD-10-CM

## 2023-01-26 DIAGNOSIS — I10 Essential (primary) hypertension: Secondary | ICD-10-CM

## 2023-01-26 DIAGNOSIS — Z9181 History of falling: Secondary | ICD-10-CM

## 2023-01-26 NOTE — Patient Instructions (Signed)
Please call the care guide team at [phone] if you need to cancel or reschedule your appointment.   If you are experiencing a Mental Health or Behavioral Health Crisis or need someone to talk to, please call the Suicide and Crisis Lifeline: 988 call the [place] National Suicide Prevention Lifeline: 6463237263 or TTY: (818) 828-7286 TTY (416)617-5030) to talk to a trained counselor call 1-800-273-TALK (toll free, 24 hour hotline)   Following is a copy of the CCM Program Consent:  CCM service includes personalized support from designated clinical staff supervised by the physician, including individualized plan of care and coordination with other care providers 24/7 contact phone numbers for assistance for urgent and routine care needs. Service will only be billed when office clinical staff spend 20 minutes or more in a month to coordinate care. Only one practitioner may furnish and bill the service in a calendar month. The patient may stop CCM services at amy time (effective at the end of the month) by phone call to the office staff. The patient will be responsible for cost sharing (co-pay) or up to 20% of the service fee (after annual deductible is met)  Following is a copy of your full provider care plan:   Goals Addressed             This Visit's Progress    CCM Expected Outcome:  Monitor, Self-Manage and Reduce Symptoms of: Falls prevention and safety concerns       Current Barriers:  Knowledge Deficits related to being safe and monitoring for changes in condition that cause more falls or safety concerns Care Coordination needs related to follow up with pcp post falls in a patient with a fall x 2 in the last 6 weeks, one at church and one at home Chronic Disease Management support and education needs related to falls prevention and safety concerns Falls x 2 in last 6 weeks. One fall 6 weeks ago, one fall 3 weeks ago  Planned Interventions: Provided written and verbal education re:  potential causes of falls and Fall prevention strategies Reviewed medications and discussed potential side effects of medications such as dizziness and frequent urination Advised patient of importance of notifying provider of falls. The patient states she did not hurt herself, she just got her feet twisted up. The patient states that she is just getting "old". Education provided on being mindful of her surroundings and changing positions slowly. The patient denies any acute distress. Last fall was about 3 weeks ago.  Assessed for signs and symptoms of orthostatic hypotension Assessed for falls since last encounter. Per the patients husband the last fall was 3 weeks ago.  Assessed patients knowledge of fall risk prevention secondary to previously provided education Provided patient information for fall alert systems Advised patient to discuss changes in mobility, changes in mentation, increase confusion, increased risk for injury at home with provider Screening for signs and symptoms of depression related to chronic disease state Assessed social determinant of health barriers  Symptom Management: Take medications as prescribed   Attend all scheduled provider appointments Call provider office for new concerns or questions  call the Suicide and Crisis Lifeline: 988 call the [place] National Suicide Prevention Lifeline: 480 243 5685 or TTY: 6607554679 TTY (445)398-3602) to talk to a trained counselor call 1-800-273-TALK (toll free, 24 hour hotline) if experiencing a Mental Health or Behavioral Health Crisis   Follow Up Plan: Telephone follow up appointment with care management team member scheduled for: 03-23-2023 at 0945 am  CCM Expected Outcome:  Monitor, Self-Manage, and Reduce Symptoms of Hypertension       Current Barriers:  Knowledge Deficits related to the need to check her blood pressures on a regular basis to effectively manage HTN in the home setting Care Coordination needs related  to ongoing support and education on monitoring changes in HTN or hearth health in a patient with HTN Chronic Disease Management support and education needs related to effective management of HTN Cognitive Deficits BP Readings from Last 3 Encounters:  12/01/22 136/74  10/17/22 126/82  09/26/22 128/66     Planned Interventions: Evaluation of current treatment plan related to hypertension self management and patient's adherence to plan as established by provider. The patient saw the pcp on 12-01-2022. Denies any acute changes in HTN or heart health. States she feels like she is doing well. Knows to call the provider for changes or new needs. ;   Provided education to patient re: stroke prevention, s/s of heart attack and stroke. Education provided; Reviewed prescribed diet the patient reminded to follow a heart healthy diet for effective management of her HTN and HLD. The patient states she is eating well, sometimes too much. The patient states that she is monitoring her sodium content and mindful of foods high in sodium and fats.  Reviewed medications with patient and discussed importance of compliance. The patient verbalized compliance with medications. Works with the pharm D on a regular basis for ongoing support and education. Reminder of appointment coming up this week to speak with the pharm D.  Discussed plans with patient for ongoing care management follow up and provided patient with direct contact information for care management team; Advised patient, providing education and rationale, to monitor blood pressure daily and record, calling PCP for findings outside established parameters. The patient continues to not take her blood pressures at home. She states she is fine and knows when her blood pressure is up because she has a headache. Review of the benefits of checking blood pressures on a regular basis. ;  Reviewed scheduled/upcoming provider appointments including:  Next appointment on  03-27-2023 unless the patient needs to be seen sooner. Knows to call for changes or needs.  Advised patient to discuss changes in her HTN or heart health with provider; Provided education on prescribed diet heart healthy;  Discussed complications of poorly controlled blood pressure such as heart disease, stroke, circulatory complications, vision complications, kidney impairment, sexual dysfunction;  Screening for signs and symptoms of depression related to chronic disease state;  Assessed social determinant of health barriers;  Discussed ways to help her back pain and discomfort. She has not used heat but discussed trying heat application to her back when she is having pain  Symptom Management: Take medications as prescribed   Attend all scheduled provider appointments Call provider office for new concerns or questions  call the Suicide and Crisis Lifeline: 988 call the [place] National Suicide Prevention Lifeline: (785) 287-8493 or TTY: 317-649-5237 TTY [phone]) to talk to a trained counselor call 1-800-273-TALK (toll free, 24 hour hotline) if experiencing a Mental Health or Behavioral Health Crisis  check blood pressure 3 times per week call doctor for signs and symptoms of high blood pressure develop an action plan for high blood pressure keep all doctor appointments take medications for blood pressure exactly as prescribed report new symptoms to your doctor  Follow Up Plan: Telephone follow up appointment with care management team member scheduled for: 03-23-2023 at 0945 am  CCM:  Maintain, Monitor and Self-Manage Symptoms of COPD       Current Barriers:  Care Coordination needs related to ongoing support and education  in a patient with COPD Chronic Disease Management support and education needs related to the effective management of COPD Cognitive Deficits  Planned Interventions: Provided patient with basic written and verbal COPD education on self care/management/and  exacerbation prevention. The patient states she is doing well. Denies any new changes in her breathing. She continues to smoke. Is not interested in smoking cessation. Sees provider on a regular basis.  Advised patient to track and manage COPD triggers. Review of triggers that can cause exacerbation. The patient stays in when it is cold outside. She knows to be mindful of others who may be sick.She denies any issues with her breathing. States her biggest issue right now is her pain in her back, she denies any acute distress today with back pain. Has called the nurseline recently due to pain and discomfort in her back. States she got a back brace to help with the back pain when she is having it worse. She is having more and more. She states that she is trying to monitor her activity and not over doing. Discussed backing activity and being mindful when she has done too much activity Provided written and verbal instructions on pursed lip breathing and utilized returned demonstration as teach back Provided instruction about proper use of medications used for management of COPD including inhalers Advised patient to self assesses COPD action plan zone and make appointment with provider if in the yellow zone for 48 hours without improvement Advised patient to engage in light exercise as tolerated 3-5 days a week to aid in the the management of COPD Provided education about and advised patient to utilize infection prevention strategies to reduce risk of respiratory infection. Review of being safe and monitoring for others who may have upper respiratory infections and to be mindful of the risk of infection and protecting self. The patient denies any acute changes in her COPD.  Discussed the importance of adequate rest and management of fatigue with COPD Screening for signs and symptoms of depression related to chronic disease state  Assessed social determinant of health barriers  Symptom Management: Take  medications as prescribed   Attend all scheduled provider appointments Call provider office for new concerns or questions  call the Suicide and Crisis Lifeline: 988 call the [place] National Suicide Prevention Lifeline: (709)358-6377 or TTY: 2285422492 TTY 223-236-2671) to talk to a trained counselor call 1-800-273-TALK (toll free, 24 hour hotline) if experiencing a Mental Health or Behavioral Health Crisis  avoid second hand smoke eliminate smoking in my home identify and remove indoor air pollutants limit outdoor activity during cold weather listen for public air quality announcements every day do breathing exercises every day begin a symptom diary develop a rescue plan eliminate symptom triggers at home follow rescue plan if symptoms flare-up  Follow Up Plan: Telephone follow up appointment with care management team member scheduled for: 03-23-2023 at 0945 am          Patient verbalizes understanding of instructions and care plan provided today and agrees to view in MyChart. Active MyChart status and patient understanding of how to access instructions and care plan via MyChart confirmed with patient.  Telephone follow up appointment with care management team member scheduled for: 03-23-2023 at 0945 am

## 2023-01-26 NOTE — Chronic Care Management (AMB) (Signed)
Chronic Care Management   CCM RN Visit Note  01/26/2023 Name: [name] MRN: [id number] DOB: Jan 08, 1941  Subjective: [name] is a 82 y.o. year old female who is a primary care patient of [name], NP. The patient was referred to the Chronic Care Management team for assistance with care management needs subsequent to provider initiation of CCM services and plan of care.    Today's Visit:  Engaged with patient by telephone for follow up visit.        Goals Addressed             This Visit's Progress    CCM Expected Outcome:  Monitor, Self-Manage and Reduce Symptoms of: Falls prevention and safety concerns       Current Barriers:  Knowledge Deficits related to being safe and monitoring for changes in condition that cause more falls or safety concerns Care Coordination needs related to follow up with pcp post falls in a patient with a fall x 2 in the last 6 weeks, one at church and one at home Chronic Disease Management support and education needs related to falls prevention and safety concerns Falls x 2 in last 6 weeks. One fall 6 weeks ago, one fall 3 weeks ago  Planned Interventions: Provided written and verbal education re: potential causes of falls and Fall prevention strategies Reviewed medications and discussed potential side effects of medications such as dizziness and frequent urination Advised patient of importance of notifying provider of falls. The patient states she did not hurt herself, she just got her feet twisted up. The patient states that she is just getting "old". Education provided on being mindful of her surroundings and changing positions slowly. The patient denies any acute distress. Last fall was about 3 weeks ago.  Assessed for signs and symptoms of orthostatic hypotension Assessed for falls since last encounter. Per the patients husband the last fall was 3 weeks ago.  Assessed patients knowledge of fall risk prevention secondary to  previously provided education Provided patient information for fall alert systems Advised patient to discuss changes in mobility, changes in mentation, increase confusion, increased risk for injury at home with provider Screening for signs and symptoms of depression related to chronic disease state Assessed social determinant of health barriers  Symptom Management: Take medications as prescribed   Attend all scheduled provider appointments Call provider office for new concerns or questions  call the Suicide and Crisis Lifeline: 988 call the [place] National Suicide Prevention Lifeline: (812) 768-1733 or TTY: 3465143215 TTY 817-611-5669) to talk to a trained counselor call 1-800-273-TALK (toll free, 24 hour hotline) if experiencing a Mental Health or Behavioral Health Crisis   Follow Up Plan: Telephone follow up appointment with care management team member scheduled for: 03-23-2023 at 0945 am       CCM Expected Outcome:  Monitor, Self-Manage, and Reduce Symptoms of Hypertension       Current Barriers:  Knowledge Deficits related to the need to check her blood pressures on a regular basis to effectively manage HTN in the home setting Care Coordination needs related to ongoing support and education on monitoring changes in HTN or hearth health in a patient with HTN Chronic Disease Management support and education needs related to effective management of HTN Cognitive Deficits BP Readings from Last 3 Encounters:  12/01/22 136/74  10/17/22 126/82  09/26/22 128/66     Planned Interventions: Evaluation of current treatment plan related to hypertension self management and patient's adherence to plan as established  by provider. The patient saw the pcp on 12-01-2022. Denies any acute changes in HTN or heart health. States she feels like she is doing well. Knows to call the provider for changes or new needs. ;   Provided education to patient re: stroke prevention, s/s of heart attack and stroke.  Education provided; Reviewed prescribed diet the patient reminded to follow a heart healthy diet for effective management of her HTN and HLD. The patient states she is eating well, sometimes too much. The patient states that she is monitoring her sodium content and mindful of foods high in sodium and fats.  Reviewed medications with patient and discussed importance of compliance. The patient verbalized compliance with medications. Works with the pharm D on a regular basis for ongoing support and education. Reminder of appointment coming up this week to speak with the pharm D.  Discussed plans with patient for ongoing care management follow up and provided patient with direct contact information for care management team; Advised patient, providing education and rationale, to monitor blood pressure daily and record, calling PCP for findings outside established parameters. The patient continues to not take her blood pressures at home. She states she is fine and knows when her blood pressure is up because she has a headache. Review of the benefits of checking blood pressures on a regular basis. ;  Reviewed scheduled/upcoming provider appointments including:  Next appointment on 03-27-2023 unless the patient needs to be seen sooner. Knows to call for changes or needs.  Advised patient to discuss changes in her HTN or heart health with provider; Provided education on prescribed diet heart healthy;  Discussed complications of poorly controlled blood pressure such as heart disease, stroke, circulatory complications, vision complications, kidney impairment, sexual dysfunction;  Screening for signs and symptoms of depression related to chronic disease state;  Assessed social determinant of health barriers;  Discussed ways to help her back pain and discomfort. She has not used heat but discussed trying heat application to her back when she is having pain  Symptom Management: Take medications as prescribed   Attend  all scheduled provider appointments Call provider office for new concerns or questions  call the Suicide and Crisis Lifeline: 988 call the [place] National Suicide Prevention Lifeline: 226-140-3618 or TTY: 209 673 3385 TTY (204)645-5533) to talk to a trained counselor call 1-800-273-TALK (toll free, 24 hour hotline) if experiencing a Mental Health or Behavioral Health Crisis  check blood pressure 3 times per week call doctor for signs and symptoms of high blood pressure develop an action plan for high blood pressure keep all doctor appointments take medications for blood pressure exactly as prescribed report new symptoms to your doctor  Follow Up Plan: Telephone follow up appointment with care management team member scheduled for: 03-23-2023 at 0945 am       CCM:  Maintain, Monitor and Self-Manage Symptoms of COPD       Current Barriers:  Care Coordination needs related to ongoing support and education  in a patient with COPD Chronic Disease Management support and education needs related to the effective management of COPD Cognitive Deficits  Planned Interventions: Provided patient with basic written and verbal COPD education on self care/management/and exacerbation prevention. The patient states she is doing well. Denies any new changes in her breathing. She continues to smoke. Is not interested in smoking cessation. Sees provider on a regular basis.  Advised patient to track and manage COPD triggers. Review of triggers that can cause exacerbation. The patient stays in when it  is cold outside. She knows to be mindful of others who may be sick.She denies any issues with her breathing. States her biggest issue right now is her pain in her back, she denies any acute distress today with back pain. Has called the nurseline recently due to pain and discomfort in her back. States she got a back brace to help with the back pain when she is having it worse. She is having more and more. She states that  she is trying to monitor her activity and not over doing. Discussed backing activity and being mindful when she has done too much activity Provided written and verbal instructions on pursed lip breathing and utilized returned demonstration as teach back Provided instruction about proper use of medications used for management of COPD including inhalers Advised patient to self assesses COPD action plan zone and make appointment with provider if in the yellow zone for 48 hours without improvement Advised patient to engage in light exercise as tolerated 3-5 days a week to aid in the the management of COPD Provided education about and advised patient to utilize infection prevention strategies to reduce risk of respiratory infection. Review of being safe and monitoring for others who may have upper respiratory infections and to be mindful of the risk of infection and protecting self. The patient denies any acute changes in her COPD.  Discussed the importance of adequate rest and management of fatigue with COPD Screening for signs and symptoms of depression related to chronic disease state  Assessed social determinant of health barriers  Symptom Management: Take medications as prescribed   Attend all scheduled provider appointments Call provider office for new concerns or questions  call the Suicide and Crisis Lifeline: 988 call the [place] National Suicide Prevention Lifeline: (563) 299-6576 or TTY: 223 395 1367 TTY 7120476994) to talk to a trained counselor call 1-800-273-TALK (toll free, 24 hour hotline) if experiencing a Mental Health or Behavioral Health Crisis  avoid second hand smoke eliminate smoking in my home identify and remove indoor air pollutants limit outdoor activity during cold weather listen for public air quality announcements every day do breathing exercises every day begin a symptom diary develop a rescue plan eliminate symptom triggers at home follow rescue plan if symptoms  flare-up  Follow Up Plan: Telephone follow up appointment with care management team member scheduled for: 03-23-2023 at 0945 am          Plan:Telephone follow up appointment with care management team member scheduled for:  03-23-2023 at 945 am  Alto Denver RN, MSN, CCM RN Care Manager  Chronic Care Management Direct Number: [phone]

## 2023-01-26 NOTE — Plan of Care (Signed)
Chronic Care Management Provider Comprehensive Care Plan    01/26/2023 Name: [name] MRN: [id number] DOB: Sep 27, 1940  Referral to Chronic Care Management (CCM) services was placed by Provider:  Nicki Reaper, NP on Date: 07-15-2022. Updated the comprehensive Care Plan on 01-26-2023 to include falls prevention and safety concerns.  Chronic Condition 1: HTN Provider Assessment and Plan  Controlled on Amlodipine, Losartan and Metoprolol Reinforced DASH diet and exercise for weight loss We will monitor    Expected Outcome/Goals Addressed This Visit (Provider CCM goals/Provider Assessment and plan    Goal: CCM (HYPERTENSION)  EXPECTED OUTCOME:  MONITOR,SELF- MANAGE AND REDUCE SYMPTOMS OF HYPERTENSION     Symptom Management Condition 1: Take all medications as prescribed Attend all scheduled provider appointments Call provider office for new concerns or questions  call the Suicide and Crisis Lifeline: 988 call the [place] National Suicide Prevention Lifeline: 7571513103 or TTY: (813)374-4215 TTY 817-100-9561) to talk to a trained counselor call 1-800-273-TALK (toll free, 24 hour hotline) if experiencing a Mental Health or Behavioral Health Crisis  check blood pressure 3 times per week learn about high blood pressure call doctor for signs and symptoms of high blood pressure keep all doctor appointments take medications for blood pressure exactly as prescribed report new symptoms to your doctor       Chronic Condition 2: COPD Provider Assessment and Plan  Encourage diet and exercise for weight loss     Expected Outcome/Goals Addressed This Visit (Provider CCM goals/Provider Assessment and plan    :Goal: CCM (COPD) EXPECTED OUTCOME: MONITOR, SELF-MANAGE AND REDUCE SYMPTOMS OF COPD     Symptom Management Condition 2: Take all medications as prescribed Attend all scheduled provider appointments Call provider office for new concerns or questions  call the Suicide and Crisis  Lifeline: 988 call the [place] National Suicide Prevention Lifeline: 202-881-9791 or TTY: (503)208-9372 TTY 825-386-6003) to talk to a trained counselor call 1-800-273-TALK (toll free, 24 hour hotline) if experiencing a Mental Health or Behavioral Health Crisis  eliminate smoking in my home identify and remove indoor air pollutants limit outdoor activity during cold weather listen for public air quality announcements every day do breathing exercises every day begin a symptom diary develop a rescue plan eliminate symptom triggers at home follow rescue plan if symptoms flare-up      Chronic Condition 3: Falls Prevention and safety concerns Provider Assessment and Plan Prevent injuries in the home setting and environment. Report new falls to the provider when they occur   Expected Outcome/Goals Addressed This Visit (Provider CCM goals/Provider Assessment and plan   CCM (Falls and safety concerns) EXPECTED OUTCOME: MONITOR, SELF-MANAGE AND REDUCE SYMPTOMS OF Falls and safety concerns    Symptom Management Condition 3: Take all medications as prescribed Attend all scheduled provider appointments Call provider office for new concerns or questions  call the Suicide and Crisis Lifeline: 988 call the [place] National Suicide Prevention Lifeline: 463-300-4885 or TTY: 249 698 5262 TTY (717)733-7446) to talk to a trained counselor call 1-800-273-TALK (toll free, 24 hour hotline) if experiencing a Mental Health or Behavioral Health Crisis   Problem List Patient Active Problem List   Diagnosis Date Noted   Thrombocytopenia (HCC) 06/10/2021   Cognitive impairment 06/10/2021   Aortic atherosclerosis (HCC) 05/08/2021   Class 1 obesity due to excess calories with body mass index (BMI) of 31.0 to 31.9 in adult 03/18/2021   RLS (restless legs syndrome) 01/29/2021   Obstructive chronic bronchitis without exacerbation 01/29/2021   Hyperlipidemia 07/09/2020   Osteopenia of  left femoral neck  06/09/2019   Hyperparathyroidism (HCC) 04/15/2019   Acquired hypothyroidism 08/05/2018   Anxiety 08/04/2018   Psychophysiological insomnia 08/04/2018   Osteoarthritis 04/14/2018   Hypertension 11/23/2017    Medication Management  Current Outpatient Medications:    acetaminophen (TYLENOL) 500 MG tablet, Take 500 mg by mouth every 6 (six) hours as needed for moderate pain. , Disp: , Rfl:    amLODipine (NORVASC) 10 MG tablet, TAKE 1 TABLET(10 MG) BY MOUTH DAILY, Disp: 90 tablet, Rfl: 0   aspirin EC 81 MG tablet, Take 81 mg by mouth daily. Swallow whole., Disp: , Rfl:    atorvastatin (LIPITOR) 40 MG tablet, Take 1 tablet (40 mg total) by mouth daily., Disp: 90 tablet, Rfl: 1   citalopram (CELEXA) 20 MG tablet, TAKE 1 TABLET(20 MG) BY MOUTH DAILY, Disp: 90 tablet, Rfl: 1   levothyroxine (SYNTHROID) 175 MCG tablet, TAKE 1 TABLET(175 MCG) BY MOUTH DAILY, Disp: 90 tablet, Rfl: 0   losartan (COZAAR) 100 MG tablet, TAKE 1 TABLET(100 MG) BY MOUTH DAILY, Disp: 90 tablet, Rfl: 0   metoprolol succinate (TOPROL-XL) 50 MG 24 hr tablet, TAKE 1 TABLET BY MOUTH EVERY DAY WITH OR IMMEDIATELY FOLLOWING A MEAL, Disp: 90 tablet, Rfl: 1   Multiple Vitamin (MULTIVITAMIN) capsule, Take 1 capsule by mouth daily., Disp: , Rfl:    rOPINIRole (REQUIP) 0.5 MG tablet, TAKE 1 TABLET(0.5 MG) BY MOUTH AT BEDTIME, Disp: 90 tablet, Rfl: 1   traZODone (DESYREL) 50 MG tablet, TAKE 1 TABLET(50 MG) BY MOUTH AT BEDTIME AS NEEDED FOR SLEEP, Disp: 90 tablet, Rfl: 0   triamcinolone cream (KENALOG) 0.1 %, Apply 1 Application topically 2 (two) times daily., Disp: 30 g, Rfl: 0  Cognitive Assessment Identity Confirmed: : Name; DOB Cognitive Status: Abnormal Other:  : patient lives with her husband, states she is doing well, denies any acute changes   Functional Assessment Hearing Difficulty or Deaf: no Wear Glasses or Blind: yes Vision Management: wears glasses Concentrating, Remembering or Making Decisions Difficulty (CP):  yes Concentration Management: some short term memory loss. Difficulty Communicating: no Difficulty Eating/Swallowing: no Walking or Climbing Stairs Difficulty: yes Walking or Climbing Stairs: ambulation difficulty, requires equipment Mobility Management: uses a cane when her back is hurting, has had 2 falls in the last 6 weeks Dressing/Bathing Difficulty: no Doing Errands Independently Difficulty (such as shopping) (CP): yes Errands Management: her husband assist when needed, he drives her to her appointments, the patient denies any acute changes Change in Functional Status Since Onset of Current Illness/Injury: no   Caregiver Assessment  Primary Source of Support/Comfort: spouse Name of Support/Comfort Primary Source: Molly Maduro- husband People in Home: spouse Name(s) of People in Home: Robert Family Caregiver if Needed: spouse Family Caregiver Names: Molly Maduro- husband Primary Roles/Responsibilities: retired   Planned Interventions  Provided patient with basic written and verbal COPD education on self care/management/and exacerbation prevention. The patient states she is doing well. Denies any new changes in her breathing. She continues to smoke. Is not interested in smoking cessation. Sees provider on a regular basis.  Advised patient to track and manage COPD triggers. Review of triggers that can cause exacerbation. The patient stays in when it is cold outside. She knows to be mindful of others who may be sick.She denies any issues with her breathing. States her biggest issue right now is her pain in her back, she denies any acute distress today with back pain. Has called the nurseline recently due to pain and discomfort in her back. States  she got a back brace to help with the back pain when she is having it worse. She is having more and more. She states that she is trying to monitor her activity and not over doing. Discussed backing activity and being mindful when she has done too much  activity Provided written and verbal instructions on pursed lip breathing and utilized returned demonstration as teach back Provided instruction about proper use of medications used for management of COPD including inhalers Advised patient to self assesses COPD action plan zone and make appointment with provider if in the yellow zone for 48 hours without improvement Advised patient to engage in light exercise as tolerated 3-5 days a week to aid in the the management of COPD Provided education about and advised patient to utilize infection prevention strategies to reduce risk of respiratory infection. Review of being safe and monitoring for others who may have upper respiratory infections and to be mindful of the risk of infection and protecting self. The patient denies any acute changes in her COPD.  Discussed the importance of adequate rest and management of fatigue with COPD Screening for signs and symptoms of depression related to chronic disease state  Assessed social determinant of health barriers Evaluation of current treatment plan related to hypertension self management and patient's adherence to plan as established by provider. The patient saw the pcp on 12-01-2022. Denies any acute changes in HTN or heart health. States she feels like she is doing well. Knows to call the provider for changes or new needs. ;   Provided education to patient re: stroke prevention, s/s of heart attack and stroke. Education provided; Reviewed prescribed diet the patient reminded to follow a heart healthy diet for effective management of her HTN and HLD. The patient states she is eating well, sometimes too much. The patient states that she is monitoring her sodium content and mindful of foods high in sodium and fats.  Reviewed medications with patient and discussed importance of compliance. The patient verbalized compliance with medications. Works with the pharm D on a regular basis for ongoing support and education.  Reminder of appointment coming up this week to speak with the pharm D.  Discussed plans with patient for ongoing care management follow up and provided patient with direct contact information for care management team; Advised patient, providing education and rationale, to monitor blood pressure daily and record, calling PCP for findings outside established parameters. The patient continues to not take her blood pressures at home. She states she is fine and knows when her blood pressure is up because she has a headache. Review of the benefits of checking blood pressures on a regular basis. ;  Reviewed scheduled/upcoming provider appointments including:  Next appointment on 03-27-2023 unless the patient needs to be seen sooner. Knows to call for changes or needs.  Advised patient to discuss changes in her HTN or heart health with provider; Provided education on prescribed diet heart healthy;  Discussed complications of poorly controlled blood pressure such as heart disease, stroke, circulatory complications, vision complications, kidney impairment, sexual dysfunction;  Screening for signs and symptoms of depression related to chronic disease state;  Assessed social determinant of health barriers;  Discussed ways to help her back pain and discomfort. She has not used heat but discussed trying heat application to her back when she is having pain  Provided written and verbal education re: potential causes of falls and Fall prevention strategies Reviewed medications and discussed potential side effects of medications such as  dizziness and frequent urination Advised patient of importance of notifying provider of falls. The patient states she did not hurt herself, she just got her feet twisted up. The patient states that she is just getting "old". Education provided on being mindful of her surroundings and changing positions slowly. The patient denies any acute distress. Last fall was about 3 weeks ago.  Assessed  for signs and symptoms of orthostatic hypotension Assessed for falls since last encounter. Per the patients husband the last fall was 3 weeks ago.  Assessed patients knowledge of fall risk prevention secondary to previously provided education Provided patient information for fall alert systems Advised patient to discuss changes in mobility, changes in mentation, increase confusion, increased risk for injury at home with provider Screening for signs and symptoms of depression related to chronic disease state Assessed social determinant of health barriers    Interaction and coordination with outside resources, practitioners, and providers See CCM Referral  Care Plan: Available in MyChart

## 2023-01-27 ENCOUNTER — Other Ambulatory Visit: Payer: Self-pay | Admitting: Internal Medicine

## 2023-01-27 DIAGNOSIS — Z1231 Encounter for screening mammogram for malignant neoplasm of breast: Secondary | ICD-10-CM

## 2023-01-28 ENCOUNTER — Ambulatory Visit: Payer: PPO | Admitting: Pharmacist

## 2023-01-28 ENCOUNTER — Ambulatory Visit
Admission: RE | Admit: 2023-01-28 | Discharge: 2023-01-28 | Disposition: A | Discharge status: Home / Self Care | Payer: PPO | Source: Ambulatory Visit | Attending: Internal Medicine | Admitting: Internal Medicine

## 2023-01-28 DIAGNOSIS — I10 Essential (primary) hypertension: Secondary | ICD-10-CM

## 2023-01-28 DIAGNOSIS — Z1231 Encounter for screening mammogram for malignant neoplasm of breast: Secondary | ICD-10-CM | POA: Diagnosis not present

## 2023-01-28 DIAGNOSIS — E039 Hypothyroidism, unspecified: Secondary | ICD-10-CM

## 2023-01-28 NOTE — Patient Instructions (Signed)
Visit Information  Thank you for taking time to visit with me today. Please don't hesitate to contact me if I can be of assistance to you before our next scheduled telephone appointment.  Following are the goals we discussed today:   Goals Addressed             This Visit's Progress    Pharmacy Goals       Please obtain new upper arm blood pressure monitor and restart checking your home blood pressure  We recommend a blood pressure cuff that goes around your upper arm, as these are generally the most accurate.   To appropriately check your blood pressure, make sure you do the following:  1) Avoid caffeine, exercise, or tobacco products for 30 minutes before checking. 2) Sit with your back supported in a flat-backed chair. Rest your arm on something flat (arm of the chair, table, etc). 3) Sit still with your feet flat on the floor, resting, for at least 5 minutes.  4) Check your blood pressure. Take 1-2 readings.   Write down these readings and bring with you to any provider appointments. Bring your home blood pressure machine with you to a provider's office for accuracy comparison at least once a year. Make sure you take your blood pressure medications before you come to any office visit.  Feel free to call me with any questions or concerns. I look forward to our next call!  Estelle Grumbles, PharmD, BCACP Clinical Pharmacist [facility] [phone]         Our next appointment is by telephone on 05/04/2023 at 1:00 PM   Please call the care guide team at [phone] if you need to cancel or reschedule your appointment.    Patient verbalizes understanding of instructions and care plan provided today and agrees to view in MyChart. Active MyChart status and patient understanding of how to access instructions and care plan via MyChart confirmed with patient.

## 2023-01-28 NOTE — Chronic Care Management (AMB) (Signed)
Chronic Care Management CCM Pharmacy Note  01/28/2023 Name:  [name] MRN:  [id number] DOB:  1941-09-09   Subjective: [name] is an 82 y.o. year old female who is a primary patient of [name], NP.  The CCM team was consulted for assistance with disease management and care coordination needs.    Engaged with patient by telephone for follow up visit for pharmacy case management and/or care coordination services.   Objective:  Medications Reviewed Today     Reviewed by Marlowe Sax, RN (Case Manager) on 01/26/23 at 1002  Med List Status: <None>   Medication Order Taking? Sig Documenting Provider Last Dose Status Informant  acetaminophen (TYLENOL) 500 MG tablet [id number] No Take 500 mg by mouth every 6 (six) hours as needed for moderate pain.  [provider] Taking Active Self  amLODipine (NORVASC) 10 MG tablet [id number]  TAKE 1 TABLET(10 MG) BY MOUTH DAILY [name], NP  Active   aspirin EC 81 MG tablet [id number] No Take 81 mg by mouth daily. Swallow whole. [provider] Taking Active   atorvastatin (LIPITOR) 40 MG tablet [id number] No Take 1 tablet (40 mg total) by mouth daily. [name], NP Taking Active   citalopram (CELEXA) 20 MG tablet [id number] No TAKE 1 TABLET(20 MG) BY MOUTH DAILY [name], NP Taking Active   levothyroxine (SYNTHROID) 175 MCG tablet [id number]  TAKE 1 TABLET(175 MCG) BY MOUTH DAILY [name], NP  Active   losartan (COZAAR) 100 MG tablet [id number] No TAKE 1 TABLET(100 MG) BY MOUTH DAILY Baity, Salvadore Oxford, NP Taking Active   metoprolol succinate (TOPROL-XL) 50 MG 24 hr tablet [id number] No TAKE 1 TABLET BY MOUTH EVERY DAY WITH OR IMMEDIATELY FOLLOWING A MEAL Baity, Salvadore Oxford, NP Taking Active   Multiple Vitamin (MULTIVITAMIN) capsule [id number] No Take 1 capsule by mouth daily. [provider] Taking Active   rOPINIRole (REQUIP) 0.5 MG tablet [id number] No TAKE 1 TABLET(0.5 MG) BY MOUTH AT  BEDTIME [name], NP Taking Active   traZODone (DESYREL) 50 MG tablet [id number]  TAKE 1 TABLET(50 MG) BY MOUTH AT BEDTIME AS NEEDED FOR SLEEP Baity, Salvadore Oxford, NP  Active   triamcinolone cream (KENALOG) 0.1 % [id number] No Apply 1 Application topically 2 (two) times daily. [name], NP Taking Active             Pertinent Labs:  Lab Results  Component Value Date   HGBA1C 5.6 09/26/2022   Lab Results  Component Value Date   CHOL 200 (H) 09/26/2022   HDL 50 09/26/2022   LDLCALC 108 (H) 09/26/2022   TRIG 292 (H) 09/26/2022   CHOLHDL 4.0 09/26/2022   Lab Results  Component Value Date   CREATININE 0.83 09/26/2022   BUN 13 09/26/2022   NA 143 09/26/2022   K 4.0 09/26/2022   CL 107 09/26/2022   CO2 26 09/26/2022   BP Readings from Last 3 Encounters:  12/01/22 136/74  10/17/22 126/82  09/26/22 128/66   Pulse Readings from Last 3 Encounters:  12/01/22 (!) 56  10/17/22 63  09/26/22 (!) 57     SDOH:  (Social Determinants of Health) assessments and interventions performed:  SDOH Interventions    Flowsheet Row Clinical Support from 12/26/2022 in [facility] Office Visit from 12/01/2022 in [facility] Chronic Care Management from 08/11/2022 in [facility] Care Coordination from 06/02/2022  in [facility] Care Coordination Clinical Support from 12/16/2021 in [facility] Office Visit from 08/20/2021 in [facility]  SDOH Interventions        Food Insecurity Interventions Intervention Not Indicated -- Intervention Not Indicated -- Intervention Not Indicated --  Housing Interventions Intervention Not Indicated -- Intervention Not Indicated -- Intervention Not Indicated --  Transportation Interventions Intervention Not Indicated -- Intervention Not Indicated -- Intervention Not Indicated --  Utilities Interventions  Intervention Not Indicated -- Intervention Not Indicated Intervention Not Indicated -- --  Alcohol Usage Interventions Intervention Not Indicated (Score <7) -- Intervention Not Indicated (Score <7) -- -- --  Depression Interventions/Treatment  -- Currently on Treatment -- -- -- PHQ2-9 Score <4 Follow-up Not Indicated  Financial Strain Interventions Intervention Not Indicated -- Intervention Not Indicated -- Intervention Not Indicated --  Physical Activity Interventions Intervention Not Indicated -- -- -- Intervention Not Indicated --  Stress Interventions Intervention Not Indicated -- Intervention Not Indicated Intervention Not Indicated Intervention Not Indicated --  Social Connections Interventions Intervention Not Indicated -- Intervention Not Indicated -- Intervention Not Indicated --       CCM Care Plan  Review of patient past medical history, allergies, medications, health status, including review of consultants reports, laboratory and other test data, was performed as part of comprehensive evaluation and provision of chronic care management services.   Care Plan : PharmD - Medication Management; HTN  Updates made by Manuela Neptune, RPH-CPP since 01/28/2023 12:00 AM     Problem: Disease Progression      Long-Range Goal: Disease Progression Prevented or Minimized   Start Date: 07/01/2021  Expected End Date: 09/29/2021  Recent Progress: On track  Priority: High  Note:   Current Barriers:  Unable to self administer medications as prescribed Lack of home blood pressure readings to share with clinical team  Pharmacist Clinical Goal(s):  patient will achieve adherence to monitoring guidelines and medication adherence to achieve therapeutic efficacy maintain control of hypertension as evidenced by blood pressure readings through collaboration with PharmD and provider.    Interventions: 1:1 collaboration with [name], NP regarding development and update of comprehensive  plan of care as evidenced by provider attestation and co-signature Inter-disciplinary care team collaboration (see longitudinal plan of care)  Hypertension:   Current treatment: Amlodipine 10 mg daily Metoprolol ER 50 mg QAM Losartan 100 mg daily Denies monitoring home blood pressure Denies hypertensive symptoms as long as she takes positional changes slowly Have counseled patient on home BP monitoring technique Have encouraged patient to start monitoring home BP, to keep a log of the results, have this record for [place] to review during telephone appointment, but to call office sooner or readings outside of established parameter or for new/worsening symptoms Patient denies interest in home monitoring at this time  Hyperlipidemia: Current treatment: atorvastatin 40 mg daily Identify that patient in need of refill of atorvastatin Patient states will call pharmacy Have encouraged patient to decrease saturated and trans fats in her diet  Hypothyroidism:  Controlled; current treatment: levothyroxine 175 mcg QAM   Patient Goals/Self-Care Activities patient will:  - take medications as prescribed - check blood pressure, document, and provide at future appointments  Follow Up Plan: The care management team will reach out to the patient again in 3 months       Estelle Grumbles, PharmD, [facility] Clinical Pharmacist [facility] [phone]

## 2023-01-30 ENCOUNTER — Telehealth: Payer: PPO

## 2023-02-05 ENCOUNTER — Ambulatory Visit (INDEPENDENT_AMBULATORY_CARE_PROVIDER_SITE_OTHER): Payer: PPO | Admitting: Internal Medicine

## 2023-02-05 ENCOUNTER — Encounter: Payer: Self-pay | Admitting: Internal Medicine

## 2023-02-05 VITALS — BP 118/64 | HR 76 | Temp 96.8°F | Wt 157.0 lb

## 2023-02-05 DIAGNOSIS — G8929 Other chronic pain: Secondary | ICD-10-CM

## 2023-02-05 DIAGNOSIS — E039 Hypothyroidism, unspecified: Secondary | ICD-10-CM | POA: Diagnosis not present

## 2023-02-05 DIAGNOSIS — M545 Low back pain, unspecified: Secondary | ICD-10-CM | POA: Diagnosis not present

## 2023-02-05 DIAGNOSIS — R296 Repeated falls: Secondary | ICD-10-CM | POA: Diagnosis not present

## 2023-02-05 DIAGNOSIS — R269 Unspecified abnormalities of gait and mobility: Secondary | ICD-10-CM

## 2023-02-05 NOTE — Patient Instructions (Signed)
Fall Prevention in the Home, Adult Falls can cause injuries and affect people of all ages. There are many simple things that you can do to make your home safe and to help prevent falls. If you need it, ask for help making these changes. What actions can I take to prevent falls? General information Use good lighting in all rooms. Make sure to: Replace any light bulbs that burn out. Turn on lights if it is dark and use night-lights. Keep items that you use often in easy-to-reach places. Lower the shelves around your home if needed. Move furniture so that there are clear paths around it. Do not keep throw rugs or other things on the floor that can make you trip. If any of your floors are uneven, fix them. Add color or contrast paint or tape to clearly mark and help you see: Grab bars or handrails. First and last steps of staircases. Where the edge of each step is. If you use a ladder or stepladder: Make sure that it is fully opened. Do not climb a closed ladder. Make sure the sides of the ladder are locked in place. Have someone hold the ladder while you use it. Know where your pets are as you move through your home. What can I do in the bathroom?     Keep the floor dry. Clean up any water that is on the floor right away. Remove soap buildup in the bathtub or shower. Buildup makes bathtubs and showers slippery. Use non-skid mats or decals on the floor of the bathtub or shower. Attach bath mats securely with double-sided, non-slip rug tape. If you need to sit down while you are in the shower, use a non-slip stool. Install grab bars by the toilet and in the bathtub and shower. Do not use towel bars as grab bars. What can I do in the bedroom? Make sure that you have a light by your bed that is easy to reach. Do not use any sheets or blankets on your bed that hang to the floor. Have a firm bench or chair with side arms that you can use for support when you get dressed. What can I do in  the kitchen? Clean up any spills right away. If you need to reach something above you, use a sturdy step stool that has a grab bar. Keep electrical cables out of the way. Do not use floor polish or wax that makes floors slippery. What can I do with my stairs? Do not leave anything on the stairs. Make sure that you have a light switch at the top and the bottom of the stairs. Have them installed if you do not have them. Make sure that there are handrails on both sides of the stairs. Fix handrails that are broken or loose. Make sure that handrails are as long as the staircases. Install non-slip stair treads on all stairs in your home if they do not have carpet. Avoid having throw rugs at the top or bottom of stairs, or secure the rugs with carpet tape to prevent them from moving. Choose a carpet design that does not hide the edge of steps on the stairs. Make sure that carpet is firmly attached to the stairs. Fix any carpet that is loose or worn. What can I do on the outside of my home? Use bright outdoor lighting. Repair the edges of walkways and driveways and fix any cracks. Clear paths of anything that can make you trip, such as tools or rocks. Add   color or contrast paint or tape to clearly mark and help you see high doorway thresholds. Trim any bushes or trees on the main path into your home. Check that handrails are securely fastened and in good repair. Both sides of all steps should have handrails. Install guardrails along the edges of any raised decks or porches. Have leaves, snow, and ice cleared regularly. Use sand, salt, or ice melt on walkways during winter months if you live where there is ice and snow. In the garage, clean up any spills right away, including grease or oil spills. What other actions can I take? Review your medicines with your health care provider. Some medicines can make you confused or feel dizzy. This can increase your chance of falling. Wear closed-toe shoes that  fit well and support your feet. Wear shoes that have rubber soles and low heels. Use a cane, walker, scooter, or crutches that help you move around if needed. Talk with your provider about other ways that you can decrease your risk of falls. This may include seeing a physical therapist to learn to do exercises to improve movement and strength. Where to find more information Centers for Disease Control and Prevention, [employer]: cdc.gov National Institute on Aging: nia.nih.gov National Institute on Aging: nia.nih.gov Contact a health care provider if: You are afraid of falling at home. You feel weak, drowsy, or dizzy at home. You fall at home. Get help right away if you: Lose consciousness or have trouble moving after a fall. Have a fall that causes a head injury. These symptoms may be an emergency. Get help right away. Call 911. Do not wait to see if the symptoms will go away. Do not drive yourself to the hospital. This information is not intended to replace advice given to you by your health care provider. Make sure you discuss any questions you have with your health care provider. Document Revised: 05/12/2022 Document Reviewed: 05/12/2022 Elsevier Patient Education  2023 Elsevier Inc.  

## 2023-02-05 NOTE — Assessment & Plan Note (Signed)
Repeat TSH today We will adjust levothyroxine if needed based on labs 

## 2023-02-05 NOTE — Progress Notes (Signed)
Subjective:    Patient ID: [name], female    DOB: [birth year], 82 y.o.   MRN: [id number]  HPI  Patient presents to clinic today with complaint of a thyroid issue.  She has a history of hypothyroidism managed on Levothyroxine which was increased to 175 mcg 09/2022.  She had not return for repeat TSH.  She would like to get this done today.  She had a thyroid ultrasound 11/2021.  Her husband also reports back pain, difficulty with gait and frequent falls. She describes the back pain as sharp. The pain does not radiate into her legs. She denies numbness, tingling of her legs but does have some weakness.  She denies loss of bowel or bladder control.  Her husband reports she has a shuffling gait. Her husband reports she has had 4 falls in the last year. She takes Tylenol as needed with minimal relief of symptoms. Xray  lumbar spine from 07/2022 reviewed.  Review of Systems     Past Medical History:  Diagnosis Date   Allergy    Anemia    Anxiety    Arthritis    Cancer (HCC)    skin cancer   Hyperlipidemia    Hypertension    Hypothyroidism    Sciatica    Sleep apnea    does not use cpap   Thrombocytopenia (HCC)    Thyroid disease     Current Outpatient Medications  Medication Sig Dispense Refill   acetaminophen (TYLENOL) 500 MG tablet Take 500 mg by mouth every 6 (six) hours as needed for moderate pain.      amLODipine (NORVASC) 10 MG tablet TAKE 1 TABLET(10 MG) BY MOUTH DAILY 90 tablet 0   aspirin EC 81 MG tablet Take 81 mg by mouth daily. Swallow whole.     atorvastatin (LIPITOR) 40 MG tablet Take 1 tablet (40 mg total) by mouth daily. 90 tablet 1   citalopram (CELEXA) 20 MG tablet TAKE 1 TABLET(20 MG) BY MOUTH DAILY 90 tablet 1   levothyroxine (SYNTHROID) 175 MCG tablet TAKE 1 TABLET(175 MCG) BY MOUTH DAILY 90 tablet 0   losartan (COZAAR) 100 MG tablet TAKE 1 TABLET(100 MG) BY MOUTH DAILY 90 tablet 0   metoprolol succinate (TOPROL-XL) 50 MG 24 hr tablet TAKE 1 TABLET BY  MOUTH EVERY DAY WITH OR IMMEDIATELY FOLLOWING A MEAL 90 tablet 1   Multiple Vitamin (MULTIVITAMIN) capsule Take 1 capsule by mouth daily.     rOPINIRole (REQUIP) 0.5 MG tablet TAKE 1 TABLET(0.5 MG) BY MOUTH AT BEDTIME 90 tablet 1   traZODone (DESYREL) 50 MG tablet TAKE 1 TABLET(50 MG) BY MOUTH AT BEDTIME AS NEEDED FOR SLEEP 90 tablet 0   triamcinolone cream (KENALOG) 0.1 % Apply 1 Application topically 2 (two) times daily. 30 g 0   No current facility-administered medications for this visit.    Allergies  Allergen Reactions   Codeine Nausea Only    Tolerates oxycodone   Morphine Nausea And Vomiting   Ace Inhibitors Cough   Adhesive [Tape] Other (See Comments) and Rash    whelps    Family History  Problem Relation Age of Onset   Dementia Mother    Dementia Sister    Heart attack Brother    Diabetes Brother    Breast cancer Paternal Aunt    Heart attack Son    Hypercalcemia Neg Hx    Cancer Neg Hx     Social History   Socioeconomic History   Marital status: Married  Spouse name: ROBERT   Number of children: 4   Years of education: Not on file   Highest education level: GED or equivalent  Occupational History   Occupation: retired   Occupation: Retired  Tobacco Use   Smoking status: Every Day    Packs/day: 0.50    Years: 40.00    Additional pack years: 0.00    Total pack years: 20.00    Types: Cigarettes   Smokeless tobacco: Never  Vaping Use   Vaping Use: Never used  Substance and Sexual Activity   Alcohol use: No   Drug use: No   Sexual activity: Not Currently  Other Topics Concern   Not on file  Social History Narrative   Not on file   Social Determinants of Health   Financial Resource Strain: Medium Risk (02/03/2023)   Overall Financial Resource Strain (CARDIA)    Difficulty of Paying Living Expenses: Somewhat hard  Food Insecurity: No Food Insecurity (02/03/2023)   Hunger Vital Sign    Worried About Running Out of Food in the Last Year: Never true     Ran Out of Food in the Last Year: Never true  Transportation Needs: No Transportation Needs (02/03/2023)   PRAPARE - Administrator, Civil Service (Medical): No    Lack of Transportation (Non-Medical): No  Physical Activity: Inactive (02/03/2023)   Exercise Vital Sign    Days of Exercise per Week: 0 days    Minutes of Exercise per Session: 30 min  Stress: Stress Concern Present (02/03/2023)   [employer] of Occupational Health - Occupational Stress Questionnaire    Feeling of Stress : To some extent  Social Connections: Moderately Isolated (02/03/2023)   Social Connection and Isolation Panel [NHANES]    Frequency of Communication with Friends and Family: Never    Frequency of Social Gatherings with Friends and Family: Never    Attends Religious Services: 1 to 4 times per year    Active Member of [employer] or Organizations: No    Attends Banker Meetings: Never    Marital Status: Married  Catering manager Violence: Not At Risk (12/26/2022)   Humiliation, Afraid, Rape, and Kick questionnaire    Fear of Current or Ex-Partner: No    Emotionally Abused: No    Physically Abused: No    Sexually Abused: No     Constitutional: Denies fever, malaise, fatigue, headache or abrupt weight changes.  HEENT: Denies eye pain, eye redness, ear pain, ringing in the ears, wax buildup, runny nose, nasal congestion, bloody nose, or sore throat. Respiratory: Denies difficulty breathing, shortness of breath, cough or sputum production.   Cardiovascular: Denies chest pain, chest tightness, palpitations or swelling in the hands or feet.  Gastrointestinal: Denies abdominal pain, bloating, constipation, diarrhea or blood in the stool.  GU: Denies urgency, frequency, pain with urination, burning sensation, blood in urine, odor or discharge. Musculoskeletal: Patient reports chronic low back pain, lower extremity weakness, difficulty with gait and frequent falls.  Denies muscle pain or  joint swelling.  Skin: Denies redness, rashes, lesions or ulcercations.  Neurological: Patient reports insomnia, restless legs and difficulty with memory.  Denies dizziness, difficulty with speech or problems with balance and coordination.  Psych: Patient has a history of anxiety.  Denies depression, SI/HI.  No other specific complaints in a complete review of systems (except as listed in HPI above).  Objective:   Physical Exam  BP 118/64 (BP Location: Right Arm, Patient Position: Sitting, Cuff Size: Normal)  Pulse 76   Temp (!) 96.8 F (36 C) (Temporal)   Wt 157 lb (71.2 kg)   LMP  (LMP Unknown)   SpO2 98%   BMI 29.18 kg/m   Wt Readings from Last 3 Encounters:  12/26/22 166 lb (75.3 kg)  12/01/22 166 lb (75.3 kg)  10/17/22 167 lb (75.8 kg)    General: Appears her stated age, chronically appearing, in NAD. HEENT: Head: normal shape and size; Eyes: sclera white, no icterus, conjunctiva pink, PERRLA and EOMs intact;  Neck:  Neck supple, trachea midline. No masses, lumps or thyromegaly present.  Cardiovascular: Normal rate and rhythm. S1,S2 noted.  No murmur, rubs or gallops noted.  Pulmonary/Chest: Normal effort and positive vesicular breath sounds. No respiratory distress. No wheezes, rales or ronchi noted.  Musculoskeletal: Decreased flexion and extension of the spine.  Normal rotation and lateral bending.  Pain with palpation of the lumbar spine.  Strength 5/5 BLE.  Shuffling gait without device. Neurological: Alert.  Difficulty with recall.  She is unable to tandem walk.    BMET    Component Value Date/Time   NA 143 09/26/2022 1116   NA 145 (H) 12/21/2017 1017   NA 139 08/26/2012 2035   K 4.0 09/26/2022 1116   K 3.3 (L) 08/26/2012 2035   CL 107 09/26/2022 1116   CL 105 08/26/2012 2035   CO2 26 09/26/2022 1116   CO2 29 08/26/2012 2035   GLUCOSE 105 (H) 09/26/2022 1116   GLUCOSE 97 08/26/2012 2035   BUN 13 09/26/2022 1116   BUN 13 12/21/2017 1017   BUN 13  08/26/2012 2035   CREATININE 0.83 09/26/2022 1116   CALCIUM 10.1 09/26/2022 1116   CALCIUM 9.9 08/26/2012 2035   GFRNONAA >60 07/18/2022 0928   GFRNONAA 66 09/11/2020 1147   GFRAA 76 09/11/2020 1147    Lipid Panel     Component Value Date/Time   CHOL 200 (H) 09/26/2022 1116   CHOL 181 12/21/2017 1017   TRIG 292 (H) 09/26/2022 1116   HDL 50 09/26/2022 1116   HDL 50 12/21/2017 1017   CHOLHDL 4.0 09/26/2022 1116   LDLCALC 108 (H) 09/26/2022 1116    CBC    Component Value Date/Time   WBC 6.8 09/26/2022 1116   RBC 4.42 09/26/2022 1116   HGB 15.1 09/26/2022 1116   HGB 14.8 12/21/2017 1017   HCT 43.4 09/26/2022 1116   HCT 42.5 12/21/2017 1017   PLT 120 (L) 09/26/2022 1116   PLT 141 (L) 12/21/2017 1017   MCV 98.2 09/26/2022 1116   MCV 93 12/21/2017 1017   MCV 92 08/26/2012 2035   MCH 34.2 (H) 09/26/2022 1116   MCHC 34.8 09/26/2022 1116   RDW 12.5 09/26/2022 1116   RDW 12.8 12/21/2017 1017   RDW 13.1 08/26/2012 2035   LYMPHSABS 2.8 02/11/2022 1307   LYMPHSABS 2.5 12/21/2017 1017   MONOABS 0.8 02/11/2022 1307   EOSABS 0.1 02/11/2022 1307   EOSABS 0.3 12/21/2017 1017   BASOSABS 0.1 02/11/2022 1307   BASOSABS 0.0 12/21/2017 1017    Hgb A1C Lab Results  Component Value Date   HGBA1C 5.6 09/26/2022           Assessment & Plan:   Chronic Low Back Pain, Difficulty with Gait, Frequent Falls:  Will obtain MRI lumbar spine Encourage regular stretching Continue Tylenol OTC as needed Referral to PT for further evaluation and treatment  RTC in 2 months for your annual exam Nicki Reaper, NP

## 2023-02-06 LAB — TSH: TSH: 0.01 mIU/L — ABNORMAL LOW (ref 0.40–4.50)

## 2023-02-09 ENCOUNTER — Other Ambulatory Visit: Payer: Self-pay

## 2023-02-09 MED ORDER — LEVOTHYROXINE SODIUM 150 MCG PO TABS: 150.0000 ug | ORAL_TABLET | Freq: Every day | ORAL | 0 refills | Status: DC

## 2023-02-12 ENCOUNTER — Ambulatory Visit
Admission: RE | Admit: 2023-02-12 | Discharge: 2023-02-12 | Disposition: A | Discharge status: Home / Self Care | Payer: PPO | Source: Ambulatory Visit | Attending: Internal Medicine | Admitting: Internal Medicine

## 2023-02-12 DIAGNOSIS — M47816 Spondylosis without myelopathy or radiculopathy, lumbar region: Secondary | ICD-10-CM | POA: Diagnosis not present

## 2023-02-12 DIAGNOSIS — G8929 Other chronic pain: Secondary | ICD-10-CM | POA: Diagnosis not present

## 2023-02-12 DIAGNOSIS — M545 Low back pain, unspecified: Secondary | ICD-10-CM | POA: Diagnosis not present

## 2023-02-12 DIAGNOSIS — M5126 Other intervertebral disc displacement, lumbar region: Secondary | ICD-10-CM | POA: Diagnosis not present

## 2023-02-19 DIAGNOSIS — R269 Unspecified abnormalities of gait and mobility: Secondary | ICD-10-CM | POA: Diagnosis not present

## 2023-02-19 DIAGNOSIS — M5459 Other low back pain: Secondary | ICD-10-CM | POA: Diagnosis not present

## 2023-02-20 DIAGNOSIS — F17210 Nicotine dependence, cigarettes, uncomplicated: Secondary | ICD-10-CM

## 2023-02-20 DIAGNOSIS — I10 Essential (primary) hypertension: Secondary | ICD-10-CM | POA: Diagnosis not present

## 2023-02-20 DIAGNOSIS — J449 Chronic obstructive pulmonary disease, unspecified: Secondary | ICD-10-CM

## 2023-02-23 DIAGNOSIS — M5459 Other low back pain: Secondary | ICD-10-CM | POA: Diagnosis not present

## 2023-02-23 DIAGNOSIS — R269 Unspecified abnormalities of gait and mobility: Secondary | ICD-10-CM | POA: Diagnosis not present

## 2023-02-26 DIAGNOSIS — R269 Unspecified abnormalities of gait and mobility: Secondary | ICD-10-CM | POA: Diagnosis not present

## 2023-02-26 DIAGNOSIS — M5459 Other low back pain: Secondary | ICD-10-CM | POA: Diagnosis not present

## 2023-03-04 DIAGNOSIS — M5459 Other low back pain: Secondary | ICD-10-CM | POA: Diagnosis not present

## 2023-03-04 DIAGNOSIS — R269 Unspecified abnormalities of gait and mobility: Secondary | ICD-10-CM | POA: Diagnosis not present

## 2023-03-08 ENCOUNTER — Other Ambulatory Visit: Payer: Self-pay | Admitting: Internal Medicine

## 2023-03-08 DIAGNOSIS — G2581 Restless legs syndrome: Secondary | ICD-10-CM

## 2023-03-09 NOTE — Telephone Encounter (Signed)
Requested Prescriptions  Pending Prescriptions Disp Refills   rOPINIRole (REQUIP) 0.5 MG tablet [Pharmacy Med Name: ROPINIROLE 0.5MG  TABLETS] 90 tablet 3    Sig: TAKE 1 TABLET(0.5 MG) BY MOUTH AT BEDTIME     Neurology:  Parkinsonian Agents Passed - 03/08/2023  9:45 AM      Passed - Last BP in normal range    BP Readings from Last 1 Encounters:  02/05/23 118/64         Passed - Last Heart Rate in normal range    Pulse Readings from Last 1 Encounters:  02/05/23 76         Passed - Valid encounter within last 12 months    Recent Outpatient Visits           1 month ago Acquired hypothyroidism   [place] [facility] [place], Salvadore Oxford, NP   3 months ago Other eczema   [facility] [place], Salvadore Oxford, NP   4 months ago Acquired hypothyroidism   [place] [facility] [place], Salvadore Oxford, NP   5 months ago Pure hypercholesterolemia   [place] [facility] [place], Salvadore Oxford, NP   6 months ago Throat discomfort   [place] [facility] [place], Salvadore Oxford, NP       Future Appointments             In 3 weeks Sampson Si, Salvadore Oxford, NP  [facility], [facility]

## 2023-03-17 ENCOUNTER — Other Ambulatory Visit: Payer: Self-pay | Admitting: Internal Medicine

## 2023-03-17 NOTE — Telephone Encounter (Signed)
Pt is requesting a refill of Losartan 100MG  called into [facility]  [place]

## 2023-03-23 ENCOUNTER — Telehealth: Payer: PPO

## 2023-03-23 ENCOUNTER — Ambulatory Visit (INDEPENDENT_AMBULATORY_CARE_PROVIDER_SITE_OTHER): Payer: PPO

## 2023-03-23 DIAGNOSIS — R296 Repeated falls: Secondary | ICD-10-CM

## 2023-03-23 DIAGNOSIS — J4489 Other specified chronic obstructive pulmonary disease: Secondary | ICD-10-CM

## 2023-03-23 DIAGNOSIS — I10 Essential (primary) hypertension: Secondary | ICD-10-CM

## 2023-03-23 NOTE — Patient Instructions (Signed)
Please call the care guide team at [phone] if you need to cancel or reschedule your appointment.   If you are experiencing a Mental Health or Behavioral Health Crisis or need someone to talk to, please call the Suicide and Crisis Lifeline: 988 call the [place] National Suicide Prevention Lifeline: 325 200 6906 or TTY: (418)484-1371 TTY 401-828-4431) to talk to a trained counselor call 1-800-273-TALK (toll free, 24 hour hotline)   Following is a copy of the CCM Program Consent:  CCM service includes personalized support from designated clinical staff supervised by the physician, including individualized plan of care and coordination with other care providers 24/7 contact phone numbers for assistance for urgent and routine care needs. Service will only be billed when office clinical staff spend 20 minutes or more in a month to coordinate care. Only one practitioner may furnish and bill the service in a calendar month. The patient may stop CCM services at amy time (effective at the end of the month) by phone call to the office staff. The patient will be responsible for cost sharing (co-pay) or up to 20% of the service fee (after annual deductible is met)  Following is a copy of your full provider care plan:   Goals Addressed             This Visit's Progress    CCM Expected Outcome:  Monitor, Self-Manage and Reduce Symptoms of: Falls prevention and safety concerns       Current Barriers:  Knowledge Deficits related to being safe and monitoring for changes in condition that cause more falls or safety concerns Care Coordination needs related to follow up with pcp post falls in a patient with a fall x 2 in the last 6 weeks, one at church and one at home Chronic Disease Management support and education needs related to falls prevention and safety concerns Falls x 2 in last 6 weeks. One fall 6 weeks ago, one fall 3 weeks ago  Planned Interventions: Provided written and verbal education re:  potential causes of falls and Fall prevention strategies. The patient denies any new falls. The patient states that he husband is with her and he monitors her. 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 Encouraged her to write down questions to ask [facility]. Will continue to monitor. Reflective listening and support given.  Reviewed medications and discussed potential side effects of medications such as dizziness and frequent urination. The patient is compliant with medications and works with pharm D on a regular basis.  Advised patient of importance of notifying provider of falls. The patient states she did not hurt herself, she just got her feet twisted up. The patient states that she is just getting "old". Education provided on being mindful of her surroundings and changing positions slowly. The patient denies any acute distress. Last fall was about 3 weeks ago. No new falls recently. The patient states she is being safe. No acute findings today.  Assessed for signs and symptoms of orthostatic hypotension Assessed for falls since last encounter. Per the patients husband the last fall was 3 weeks ago. Denies any new falls recently. Is being safe.  Assessed patients knowledge of fall risk prevention secondary to previously provided education Provided patient information for fall alert systems Advised patient to discuss changes in mobility, changes in mentation, increase confusion, increased risk for injury at home with provider Screening for signs and symptoms of depression related to chronic disease state  Assessed social determinant of health barriers  Symptom Management: Take medications as prescribed   Attend all scheduled provider appointments Call provider office for new concerns or questions  call the Suicide and Crisis Lifeline: 988 call the [place] National Suicide Prevention Lifeline: (305)008-2615 or TTY: 249-203-9935 TTY  (612)298-3774) to talk to a trained counselor call 1-800-273-TALK (toll free, 24 hour hotline) if experiencing a Mental Health or Behavioral Health Crisis   Follow Up Plan: Telephone follow up appointment with care management team member scheduled for: 05-26-2023 at 0945 am       CCM Expected Outcome:  Monitor, Self-Manage, and Reduce Symptoms of Hypertension       Current Barriers:  Knowledge Deficits related to the need to check her blood pressures on a regular basis to effectively manage HTN in the home setting Care Coordination needs related to ongoing support and education on monitoring changes in HTN or hearth health in a patient with HTN Chronic Disease Management support and education needs related to effective management of HTN Cognitive Deficits BP Readings from Last 3 Encounters:  02/05/23 118/64  12/01/22 136/74  10/17/22 126/82     Planned Interventions: Evaluation of current treatment plan related to hypertension self management and patient's adherence to plan as established by provider. The patient saw the pcp on 02-05-2023. Denies any acute changes in HTN or heart health. States she feels like she is doing well. Knows to call the provider for changes or new needs. ;   Provided education to patient re: stroke prevention, s/s of heart attack and stroke. Education provided; Reviewed prescribed diet the patient reminded to follow a heart healthy diet for effective management of her HTN and HLD. The patient states she is eating well, sometimes too much. The patient states that she is monitoring her sodium content and mindful of foods high in sodium and fats.  Reviewed medications with patient and discussed importance of compliance. The patient verbalized compliance with medications. Works with the pharm D on a regular basis for ongoing support and education.  Discussed plans with patient for ongoing care management follow up and provided patient with direct contact information for  care management team; Advised patient, providing education and rationale, to monitor blood pressure daily and record, calling PCP for findings outside established parameters. The patient continues to not take her blood pressures at home. She states she is fine and knows when her blood pressure is up because she has a headache. Review of the benefits of checking blood pressures on a regular basis. ;  Reviewed scheduled/upcoming provider appointments including:  Next appointment on 03-30-2023 unless the patient needs to be seen sooner. Knows to call for changes or needs.  Advised patient to discuss changes in her HTN or heart health with provider; Provided education on prescribed diet heart healthy;  Discussed complications of poorly controlled blood pressure such as heart disease, stroke, circulatory complications, vision complications, kidney impairment, sexual dysfunction;  Screening for signs and symptoms of depression related to chronic disease state;  Assessed social determinant of health barriers;  Discussed ways to help her back pain and discomfort. She has not used heat but discussed trying heat application to her back when she is having pain. Still having pain and discomfort in her back. Will discuss with the pcp coming up.   Symptom Management: Take medications as prescribed   Attend all scheduled provider appointments Call provider office for new concerns or questions  call the Suicide and Crisis Lifeline: 988 call the [place] National  Suicide Prevention Lifeline: 985-537-1503 or TTY: 386-753-4256 TTY 709-670-4432) to talk to a trained counselor call 1-800-273-TALK (toll free, 24 hour hotline) if experiencing a Mental Health or Behavioral Health Crisis  check blood pressure 3 times per week call doctor for signs and symptoms of high blood pressure develop an action plan for high blood pressure keep all doctor appointments take medications for blood pressure exactly as prescribed report  new symptoms to your doctor  Follow Up Plan: Telephone follow up appointment with care management team member scheduled for: 05-26-2023 at 0945 am       CCM:  Maintain, Monitor and Self-Manage Symptoms of COPD       Current Barriers:  Care Coordination needs related to ongoing support and education  in a patient with COPD Chronic Disease Management support and education needs related to the effective management of COPD Cognitive Deficits  Planned Interventions: Provided patient with basic written and verbal COPD education on self care/management/and exacerbation prevention. The patient states she is doing well. Denies any new changes in her breathing. She continues to smoke. Is not interested in smoking cessation. The patient denies any acute findings today related to her breathing. She states that she is doing well. Sees the pcp next week. Will continue to monitor for changes.  Advised patient to track and manage COPD triggers. Review of triggers that can cause exacerbation. The patient stays in when it is cold outside. She knows to be mindful of others who may be sick.She denies any issues with her breathing. States her biggest issue right now is her pain in her back, she denies any acute distress today with back pain. Feels that it is her arthritis and she will discuss with the pcp coming up. Will continue to monitor for changes.  Provided written and verbal instructions on pursed lip breathing and utilized returned demonstration as teach back Provided instruction about proper use of medications used for management of COPD including inhalers Advised patient to self assesses COPD action plan zone and make appointment with provider if in the yellow zone for 48 hours without improvement Advised patient to engage in light exercise as tolerated 3-5 days a week to aid in the the management of COPD Provided education about and advised patient to utilize infection prevention strategies to reduce risk of  respiratory infection. Review of being safe and monitoring for others who may have upper respiratory infections and to be mindful of the risk of infection and protecting self. The patient denies any acute changes in her COPD.  Discussed the importance of adequate rest and management of fatigue with COPD Screening for signs and symptoms of depression related to chronic disease state  Assessed social determinant of health barriers  Symptom Management: Take medications as prescribed   Attend all scheduled provider appointments Call provider office for new concerns or questions  call the Suicide and Crisis Lifeline: 988 call the [place] National Suicide Prevention Lifeline: (414) 666-8172 or TTY: 807-879-1373 TTY 806 887 4932) to talk to a trained counselor call 1-800-273-TALK (toll free, 24 hour hotline) if experiencing a Mental Health or Behavioral Health Crisis  avoid second hand smoke eliminate smoking in my home identify and remove indoor air pollutants limit outdoor activity during cold weather listen for public air quality announcements every day do breathing exercises every day begin a symptom diary develop a rescue plan eliminate symptom triggers at home follow rescue plan if symptoms flare-up  Follow Up Plan: Telephone follow up appointment with care management team member scheduled for: 05-26-2023  at 0945 am          Patient verbalizes understanding of instructions and care plan provided today and agrees to view in MyChart. Active MyChart status and patient understanding of how to access instructions and care plan via MyChart confirmed with patient.  Telephone follow up appointment with care management team member scheduled for: 05-26-2023 at 0945 am

## 2023-03-23 NOTE — Chronic Care Management (AMB) (Signed)
Chronic Care Management   CCM RN Visit Note  03/23/2023 Name: PASHA AGERS MRN: [id number] DOB: [birth year]  Subjective: [name] is a 82 y.o. year old female who is a primary care patient of [name], NP. The patient was referred to the Chronic Care Management team for assistance with care management needs subsequent to provider initiation of CCM services and plan of care.    Today's Visit:  Engaged with patient by telephone for follow up visit.        Goals Addressed             This Visit's Progress    CCM Expected Outcome:  Monitor, Self-Manage and Reduce Symptoms of: Falls prevention and safety concerns       Current Barriers:  Knowledge Deficits related to being safe and monitoring for changes in condition that cause more falls or safety concerns Care Coordination needs related to follow up with pcp post falls in a patient with a fall x 2 in the last 6 weeks, one at church and one at home Chronic Disease Management support and education needs related to falls prevention and safety concerns Falls x 2 in last 6 weeks. One fall 6 weeks ago, one fall 3 weeks ago  Planned Interventions: Provided written and verbal education re: potential causes of falls and Fall prevention strategies. The patient denies any new falls. The patient states that he husband is with her and he monitors her. 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 Encouraged her to write down questions to ask [facility]. Will continue to monitor. Reflective listening and support given.  Reviewed medications and discussed potential side effects of medications such as dizziness and frequent urination. The patient is compliant with medications and works with pharm D on a regular basis.  Advised patient of importance of notifying provider of falls. The patient states she did not hurt herself, she just got her feet twisted up. The patient states that she  is just getting "old". Education provided on being mindful of her surroundings and changing positions slowly. The patient denies any acute distress. Last fall was about 3 weeks ago. No new falls recently. The patient states she is being safe. No acute findings today.  Assessed for signs and symptoms of orthostatic hypotension Assessed for falls since last encounter. Per the patients husband the last fall was 3 weeks ago. Denies any new falls recently. Is being safe.  Assessed patients knowledge of fall risk prevention secondary to previously provided education Provided patient information for fall alert systems Advised patient to discuss changes in mobility, changes in mentation, increase confusion, increased risk for injury at home with provider Screening for signs and symptoms of depression related to chronic disease state Assessed social determinant of health barriers  Symptom Management: Take medications as prescribed   Attend all scheduled provider appointments Call provider office for new concerns or questions  call the Suicide and Crisis Lifeline: 988 call the [place] National Suicide Prevention Lifeline: (530) 379-4544 or TTY: 469-390-2943 TTY (718)524-1286) to talk to a trained counselor call 1-800-273-TALK (toll free, 24 hour hotline) if experiencing a Mental Health or Behavioral Health Crisis   Follow Up Plan: Telephone follow up appointment with care management team member scheduled for: 05-26-2023 at 0945 am       CCM Expected Outcome:  Monitor, Self-Manage, and Reduce Symptoms of Hypertension       Current Barriers:  Knowledge  Deficits related to the need to check her blood pressures on a regular basis to effectively manage HTN in the home setting Care Coordination needs related to ongoing support and education on monitoring changes in HTN or hearth health in a patient with HTN Chronic Disease Management support and education needs related to effective management of HTN Cognitive  Deficits BP Readings from Last 3 Encounters:  02/05/23 118/64  12/01/22 136/74  10/17/22 126/82     Planned Interventions: Evaluation of current treatment plan related to hypertension self management and patient's adherence to plan as established by provider. The patient saw the pcp on 02-05-2023. Denies any acute changes in HTN or heart health. States she feels like she is doing well. Knows to call the provider for changes or new needs. ;   Provided education to patient re: stroke prevention, s/s of heart attack and stroke. Education provided; Reviewed prescribed diet the patient reminded to follow a heart healthy diet for effective management of her HTN and HLD. The patient states she is eating well, sometimes too much. The patient states that she is monitoring her sodium content and mindful of foods high in sodium and fats.  Reviewed medications with patient and discussed importance of compliance. The patient verbalized compliance with medications. Works with the pharm D on a regular basis for ongoing support and education.  Discussed plans with patient for ongoing care management follow up and provided patient with direct contact information for care management team; Advised patient, providing education and rationale, to monitor blood pressure daily and record, calling PCP for findings outside established parameters. The patient continues to not take her blood pressures at home. She states she is fine and knows when her blood pressure is up because she has a headache. Review of the benefits of checking blood pressures on a regular basis. ;  Reviewed scheduled/upcoming provider appointments including:  Next appointment on 03-30-2023 unless the patient needs to be seen sooner. Knows to call for changes or needs.  Advised patient to discuss changes in her HTN or heart health with provider; Provided education on prescribed diet heart healthy;  Discussed complications of poorly controlled blood pressure  such as heart disease, stroke, circulatory complications, vision complications, kidney impairment, sexual dysfunction;  Screening for signs and symptoms of depression related to chronic disease state;  Assessed social determinant of health barriers;  Discussed ways to help her back pain and discomfort. She has not used heat but discussed trying heat application to her back when she is having pain. Still having pain and discomfort in her back. Will discuss with the pcp coming up.   Symptom Management: Take medications as prescribed   Attend all scheduled provider appointments Call provider office for new concerns or questions  call the Suicide and Crisis Lifeline: 988 call the [place] National Suicide Prevention Lifeline: 973-592-1520 or TTY: 289-514-9594 TTY 431-388-5371) to talk to a trained counselor call 1-800-273-TALK (toll free, 24 hour hotline) if experiencing a Mental Health or Behavioral Health Crisis  check blood pressure 3 times per week call doctor for signs and symptoms of high blood pressure develop an action plan for high blood pressure keep all doctor appointments take medications for blood pressure exactly as prescribed report new symptoms to your doctor  Follow Up Plan: Telephone follow up appointment with care management team member scheduled for: 05-26-2023 at 0945 am       CCM:  Maintain, Monitor and Self-Manage Symptoms of COPD       Current Barriers:  Care Coordination needs related to ongoing support and education  in a patient with COPD Chronic Disease Management support and education needs related to the effective management of COPD Cognitive Deficits  Planned Interventions: Provided patient with basic written and verbal COPD education on self care/management/and exacerbation prevention. The patient states she is doing well. Denies any new changes in her breathing. She continues to smoke. Is not interested in smoking cessation. The patient denies any acute findings  today related to her breathing. She states that she is doing well. Sees the pcp next week. Will continue to monitor for changes.  Advised patient to track and manage COPD triggers. Review of triggers that can cause exacerbation. The patient stays in when it is cold outside. She knows to be mindful of others who may be sick.She denies any issues with her breathing. States her biggest issue right now is her pain in her back, she denies any acute distress today with back pain. Feels that it is her arthritis and she will discuss with the pcp coming up. Will continue to monitor for changes.  Provided written and verbal instructions on pursed lip breathing and utilized returned demonstration as teach back Provided instruction about proper use of medications used for management of COPD including inhalers Advised patient to self assesses COPD action plan zone and make appointment with provider if in the yellow zone for 48 hours without improvement Advised patient to engage in light exercise as tolerated 3-5 days a week to aid in the the management of COPD Provided education about and advised patient to utilize infection prevention strategies to reduce risk of respiratory infection. Review of being safe and monitoring for others who may have upper respiratory infections and to be mindful of the risk of infection and protecting self. The patient denies any acute changes in her COPD.  Discussed the importance of adequate rest and management of fatigue with COPD Screening for signs and symptoms of depression related to chronic disease state  Assessed social determinant of health barriers  Symptom Management: Take medications as prescribed   Attend all scheduled provider appointments Call provider office for new concerns or questions  call the Suicide and Crisis Lifeline: 988 call the [place] National Suicide Prevention Lifeline: (716)218-0240 or TTY: (928)037-7430 TTY (848) 470-9666) to talk to a trained  counselor call 1-800-273-TALK (toll free, 24 hour hotline) if experiencing a Mental Health or Behavioral Health Crisis  avoid second hand smoke eliminate smoking in my home identify and remove indoor air pollutants limit outdoor activity during cold weather listen for public air quality announcements every day do breathing exercises every day begin a symptom diary develop a rescue plan eliminate symptom triggers at home follow rescue plan if symptoms flare-up  Follow Up Plan: Telephone follow up appointment with care management team member scheduled for: 05-26-2023 at 0945 am          Plan:Telephone follow up appointment with care management team member scheduled for:  05-26-2023 at 0945 am  Alto Denver RN, MSN, CCM RN Care Manager  Chronic Care Management Direct Number: [phone]

## 2023-03-27 ENCOUNTER — Ambulatory Visit: Payer: PPO | Admitting: Internal Medicine

## 2023-03-30 ENCOUNTER — Encounter: Payer: Self-pay | Admitting: Internal Medicine

## 2023-03-30 ENCOUNTER — Ambulatory Visit (INDEPENDENT_AMBULATORY_CARE_PROVIDER_SITE_OTHER): Payer: PPO | Admitting: Internal Medicine

## 2023-03-30 VITALS — BP 124/62 | HR 58 | Temp 96.9°F | Wt 152.0 lb

## 2023-03-30 DIAGNOSIS — Z0001 Encounter for general adult medical examination with abnormal findings: Secondary | ICD-10-CM

## 2023-03-30 DIAGNOSIS — R7309 Other abnormal glucose: Secondary | ICD-10-CM | POA: Diagnosis not present

## 2023-03-30 DIAGNOSIS — E663 Overweight: Secondary | ICD-10-CM

## 2023-03-30 DIAGNOSIS — Z6828 Body mass index (BMI) 28.0-28.9, adult: Secondary | ICD-10-CM

## 2023-03-30 DIAGNOSIS — E7800 Pure hypercholesterolemia, unspecified: Secondary | ICD-10-CM

## 2023-03-30 DIAGNOSIS — M5431 Sciatica, right side: Secondary | ICD-10-CM

## 2023-03-30 DIAGNOSIS — E039 Hypothyroidism, unspecified: Secondary | ICD-10-CM | POA: Diagnosis not present

## 2023-03-30 LAB — CBC
MCH: 34.6 pg — ABNORMAL HIGH (ref 27.0–33.0)
MCV: 103.6 fL — ABNORMAL HIGH (ref 80.0–100.0)
Platelets: 113 10*3/uL — ABNORMAL LOW (ref 140–400)
WBC: 6.9 10*3/uL (ref 3.8–10.8)

## 2023-03-30 NOTE — Progress Notes (Signed)
Subjective:    Patient ID: [name], female    DOB: Sep 01, 1941, 82 y.o.   MRN: [id number]  HPI  Patient presents to clinic today for her annual exam.  Flu: 06/2022 Tetanus: >10 years ago COVID: Pfizer x 4 Pneumovax: 05/2020 Prevnar: 07/2018 Shingrix: Never Pap smear: Hysterectomy Mammogram: 01/2023 Bone density: 12/2021 Colon screening: 04/2021 Vision screening: as needed Dentist: dentures  Diet: She does eat meat. She consumes fruits and veggies. She does eat fried foods. She drinks mostly water, dt pepsi and tea. Exercise: None  Review of Systems     Past Medical History:  Diagnosis Date   Allergy    Anemia    Anxiety    Arthritis    Cancer (HCC)    skin cancer   Hyperlipidemia    Hypertension    Hypothyroidism    Sciatica    Sleep apnea    does not use cpap   Thrombocytopenia (HCC)    Thyroid disease     Current Outpatient Medications  Medication Sig Dispense Refill   acetaminophen (TYLENOL) 500 MG tablet Take 500 mg by mouth every 6 (six) hours as needed for moderate pain.      amLODipine (NORVASC) 10 MG tablet TAKE 1 TABLET(10 MG) BY MOUTH DAILY 90 tablet 0   aspirin EC 81 MG tablet Take 81 mg by mouth daily. Swallow whole.     atorvastatin (LIPITOR) 40 MG tablet Take 1 tablet (40 mg total) by mouth daily. 90 tablet 1   citalopram (CELEXA) 20 MG tablet TAKE 1 TABLET(20 MG) BY MOUTH DAILY 90 tablet 1   levothyroxine (SYNTHROID) 150 MCG tablet Take 1 tablet (150 mcg total) by mouth daily. 90 tablet 0   losartan (COZAAR) 100 MG tablet TAKE 1 TABLET(100 MG) BY MOUTH DAILY 90 tablet 1   metoprolol succinate (TOPROL-XL) 50 MG 24 hr tablet TAKE 1 TABLET BY MOUTH EVERY DAY WITH OR IMMEDIATELY FOLLOWING A MEAL 90 tablet 1   Multiple Vitamin (MULTIVITAMIN) capsule Take 1 capsule by mouth daily.     rOPINIRole (REQUIP) 0.5 MG tablet TAKE 1 TABLET(0.5 MG) BY MOUTH AT BEDTIME 90 tablet 3   traZODone (DESYREL) 50 MG tablet TAKE 1 TABLET(50 MG) BY MOUTH AT  BEDTIME AS NEEDED FOR SLEEP 90 tablet 0   triamcinolone cream (KENALOG) 0.1 % Apply 1 Application topically 2 (two) times daily. 30 g 0   No current facility-administered medications for this visit.    Allergies  Allergen Reactions   Codeine Nausea Only    Tolerates oxycodone   Morphine Nausea And Vomiting   Ace Inhibitors Cough   Adhesive [Tape] Other (See Comments) and Rash    whelps    Family History  Problem Relation Age of Onset   Dementia Mother    Dementia Sister    Heart attack Brother    Diabetes Brother    Breast cancer Paternal Aunt    Heart attack Son    Hypercalcemia Neg Hx    Cancer Neg Hx     Social History   Socioeconomic History   Marital status: Married    Spouse name: ROBERT   Number of children: 4   Years of education: Not on file   Highest education level: GED or equivalent  Occupational History   Occupation: retired   Occupation: Retired  Tobacco Use   Smoking status: Every Day    Packs/day: 0.50    Years: 40.00    Additional pack years: 0.00    Total  pack years: 20.00    Types: Cigarettes   Smokeless tobacco: Never  Vaping Use   Vaping Use: Never used  Substance and Sexual Activity   Alcohol use: No   Drug use: No   Sexual activity: Not Currently  Other Topics Concern   Not on file  Social History Narrative   Not on file   Social Determinants of Health   Financial Resource Strain: Medium Risk (02/03/2023)   Overall Financial Resource Strain (CARDIA)    Difficulty of Paying Living Expenses: Somewhat hard  Food Insecurity: No Food Insecurity (02/03/2023)   Hunger Vital Sign    Worried About Running Out of Food in the Last Year: Never true    Ran Out of Food in the Last Year: Never true  Transportation Needs: No Transportation Needs (02/03/2023)   PRAPARE - Administrator, Civil Service (Medical): No    Lack of Transportation (Non-Medical): No  Physical Activity: Inactive (02/03/2023)   Exercise Vital Sign    Days of  Exercise per Week: 0 days    Minutes of Exercise per Session: 30 min  Stress: Stress Concern Present (02/03/2023)   [employer] of Occupational Health - Occupational Stress Questionnaire    Feeling of Stress : To some extent  Social Connections: Moderately Isolated (02/03/2023)   Social Connection and Isolation Panel [NHANES]    Frequency of Communication with Friends and Family: Never    Frequency of Social Gatherings with Friends and Family: Never    Attends Religious Services: 1 to 4 times per year    Active Member of [employer] or Organizations: No    Attends Banker Meetings: Never    Marital Status: Married  Catering manager Violence: Not At Risk (12/26/2022)   Humiliation, Afraid, Rape, and Kick questionnaire    Fear of Current or Ex-Partner: No    Emotionally Abused: No    Physically Abused: No    Sexually Abused: No     Constitutional: Denies fever, malaise, fatigue, headache or abrupt weight changes.  HEENT: Denies eye pain, eye redness, ear pain, ringing in the ears, wax buildup, runny nose, nasal congestion, bloody nose, or sore throat. Respiratory: Denies difficulty breathing, shortness of breath, cough or sputum production.   Cardiovascular: Denies chest pain, chest tightness, palpitations or swelling in the hands or feet.  Gastrointestinal: Denies abdominal pain, bloating, constipation, diarrhea or blood in the stool.  GU: Denies urgency, frequency, pain with urination, burning sensation, blood in urine, odor or discharge. Musculoskeletal: Patient reports joint pain and difficulty with gait.  Denies decrease in range of motion, difficulty with gait, muscle pain or joint swelling.  Skin: Denies redness, rashes, lesions or ulcercations.  Neurological: Patient reports insomnia, restless legs, difficulty with memory, difficulty with balance.  Denies dizziness, difficulty with speech or problems with coordination.  Psych: Patient has a history of anxiety.  Denies  depression, SI/HI.  No other specific complaints in a complete review of systems (except as listed in HPI above).  Objective:   Physical Exam  BP 124/62 (BP Location: Right Arm, Patient Position: Sitting, Cuff Size: Normal)   Pulse (!) 58   Temp (!) 96.9 F (36.1 C) (Temporal)   Wt 152 lb (68.9 kg)   LMP  (LMP Unknown)   SpO2 99%   BMI 28.26 kg/m   Wt Readings from Last 3 Encounters:  02/05/23 157 lb (71.2 kg)  12/26/22 166 lb (75.3 kg)  12/01/22 166 lb (75.3 kg)    General: Appears  her stated age, overweight in NAD. Skin: Warm, dry and intact.  HEENT: Head: normal shape and size; Eyes: sclera white, no icterus, conjunctiva pink, PERRLA and EOMs intact;  Neck:  Neck supple, trachea midline. No masses, lumps or thyromegaly present.  Cardiovascular: Bradycardic with normal rhythm. S1,S2 noted.  No murmur, rubs or gallops noted. No JVD or BLE edema. No carotid bruits noted. Pulmonary/Chest: Normal effort and positive vesicular breath sounds. No respiratory distress. No wheezes, rales or ronchi noted.  Abdomen: Soft and nontender. Normal bowel sounds.  Musculoskeletal: No bony tenderness noted over the lumbar spine.  Pain with palpation over the right SI joint and right paralumbar muscles.  Shuffling gait device. Neurological: Alert and oriented. Coordination normal.  Psychiatric: Mood and affect normal. Behavior is normal. Judgment and thought content normal.     BMET    Component Value Date/Time   NA 143 09/26/2022 1116   NA 145 (H) 12/21/2017 1017   NA 139 08/26/2012 2035   K 4.0 09/26/2022 1116   K 3.3 (L) 08/26/2012 2035   CL 107 09/26/2022 1116   CL 105 08/26/2012 2035   CO2 26 09/26/2022 1116   CO2 29 08/26/2012 2035   GLUCOSE 105 (H) 09/26/2022 1116   GLUCOSE 97 08/26/2012 2035   BUN 13 09/26/2022 1116   BUN 13 12/21/2017 1017   BUN 13 08/26/2012 2035   CREATININE 0.83 09/26/2022 1116   CALCIUM 10.1 09/26/2022 1116   CALCIUM 9.9 08/26/2012 2035   GFRNONAA  >60 07/18/2022 0928   GFRNONAA 66 09/11/2020 1147   GFRAA 76 09/11/2020 1147    Lipid Panel     Component Value Date/Time   CHOL 200 (H) 09/26/2022 1116   CHOL 181 12/21/2017 1017   TRIG 292 (H) 09/26/2022 1116   HDL 50 09/26/2022 1116   HDL 50 12/21/2017 1017   CHOLHDL 4.0 09/26/2022 1116   LDLCALC 108 (H) 09/26/2022 1116    CBC    Component Value Date/Time   WBC 6.8 09/26/2022 1116   RBC 4.42 09/26/2022 1116   HGB 15.1 09/26/2022 1116   HGB 14.8 12/21/2017 1017   HCT 43.4 09/26/2022 1116   HCT 42.5 12/21/2017 1017   PLT 120 (L) 09/26/2022 1116   PLT 141 (L) 12/21/2017 1017   MCV 98.2 09/26/2022 1116   MCV 93 12/21/2017 1017   MCV 92 08/26/2012 2035   MCH 34.2 (H) 09/26/2022 1116   MCHC 34.8 09/26/2022 1116   RDW 12.5 09/26/2022 1116   RDW 12.8 12/21/2017 1017   RDW 13.1 08/26/2012 2035   LYMPHSABS 2.8 02/11/2022 1307   LYMPHSABS 2.5 12/21/2017 1017   MONOABS 0.8 02/11/2022 1307   EOSABS 0.1 02/11/2022 1307   EOSABS 0.3 12/21/2017 1017   BASOSABS 0.1 02/11/2022 1307   BASOSABS 0.0 12/21/2017 1017    Hgb A1C Lab Results  Component Value Date   HGBA1C 5.6 09/26/2022           Assessment & Plan:   Preventative health maintenance:   Encouraged her to get a flu shot in the fall She declines tetanus for financial reasons, advised her if she gets bit or cut to go get this done COVID-vaccine UTD Pneumovax and Prevnar UTD Discussed Shingrix vaccine, she will check coverage with her insurance company and schedule visit if she would like to have this done She no longer needs Pap smears She no longer wants to screen for breast cancer Bone density UTD She no longer wants to screen for colon  cancer given her age Encouraged her to consume a balanced diet and exercise regimen Advised her to see an eye doctor and dentist annually We will check CBC, c-Met, TSH, Free T4,  lipid, A1c today  RTC in 6 months, follow-up chronic conditions Nicki Reaper, NP

## 2023-03-30 NOTE — Patient Instructions (Signed)
Health Maintenance for Postmenopausal Women Menopause is a normal process in which your ability to get pregnant comes to an end. This process happens slowly over many months or years, usually between the ages of 48 and 55. Menopause is complete when you have missed your menstrual period for 12 months. It is important to talk with your health care provider about some of the most common conditions that affect women after menopause (postmenopausal women). These include heart disease, cancer, and bone loss (osteoporosis). Adopting a healthy lifestyle and getting preventive care can help to promote your health and wellness. The actions you take can also lower your chances of developing some of these common conditions. What are the signs and symptoms of menopause? During menopause, you may have the following symptoms: Hot flashes. These can be moderate or severe. Night sweats. Decrease in sex drive. Mood swings. Headaches. Tiredness (fatigue). Irritability. Memory problems. Problems falling asleep or staying asleep. Talk with your health care provider about treatment options for your symptoms. Do I need hormone replacement therapy? Hormone replacement therapy is effective in treating symptoms that are caused by menopause, such as hot flashes and night sweats. Hormone replacement carries certain risks, especially as you become older. If you are thinking about using estrogen or estrogen with progestin, discuss the benefits and risks with your health care provider. How can I reduce my risk for heart disease and stroke? The risk of heart disease, heart attack, and stroke increases as you age. One of the causes may be a change in the body's hormones during menopause. This can affect how your body uses dietary fats, triglycerides, and cholesterol. Heart attack and stroke are medical emergencies. There are many things that you can do to help prevent heart disease and stroke. Watch your blood pressure High  blood pressure causes heart disease and increases the risk of stroke. This is more likely to develop in people who have high blood pressure readings or are overweight. Have your blood pressure checked: Every 3-5 years if you are 18-39 years of age. Every year if you are 40 years old or older. Eat a healthy diet  Eat a diet that includes plenty of vegetables, fruits, low-fat dairy products, and lean protein. Do not eat a lot of foods that are high in solid fats, added sugars, or sodium. Get regular exercise Get regular exercise. This is one of the most important things you can do for your health. Most adults should: Try to exercise for at least 150 minutes each week. The exercise should increase your heart rate and make you sweat (moderate-intensity exercise). Try to do strengthening exercises at least twice each week. Do these in addition to the moderate-intensity exercise. Spend less time sitting. Even light physical activity can be beneficial. Other tips Work with your health care provider to achieve or maintain a healthy weight. Do not use any products that contain nicotine or tobacco. These products include cigarettes, chewing tobacco, and vaping devices, such as e-cigarettes. If you need help quitting, ask your health care provider. Know your numbers. Ask your health care provider to check your cholesterol and your blood sugar (glucose). Continue to have your blood tested as directed by your health care provider. Do I need screening for cancer? Depending on your health history and family history, you may need to have cancer screenings at different stages of your life. This may include screening for: Breast cancer. Cervical cancer. Lung cancer. Colorectal cancer. What is my risk for osteoporosis? After menopause, you may be   at increased risk for osteoporosis. Osteoporosis is a condition in which bone destruction happens more quickly than new bone creation. To help prevent osteoporosis or  the bone fractures that can happen because of osteoporosis, you may take the following actions: If you are 19-50 years old, get at least 1,000 mg of calcium and at least 600 international units (IU) of vitamin D per day. If you are older than age 50 but younger than age 70, get at least 1,200 mg of calcium and at least 600 international units (IU) of vitamin D per day. If you are older than age 70, get at least 1,200 mg of calcium and at least 800 international units (IU) of vitamin D per day. Smoking and drinking excessive alcohol increase the risk of osteoporosis. Eat foods that are rich in calcium and vitamin D, and do weight-bearing exercises several times each week as directed by your health care provider. How does menopause affect my mental health? Depression may occur at any age, but it is more common as you become older. Common symptoms of depression include: Feeling depressed. Changes in sleep patterns. Changes in appetite or eating patterns. Feeling an overall lack of motivation or enjoyment of activities that you previously enjoyed. Frequent crying spells. Talk with your health care provider if you think that you are experiencing any of these symptoms. General instructions See your health care provider for regular wellness exams and vaccines. This may include: Scheduling regular health, dental, and eye exams. Getting and maintaining your vaccines. These include: Influenza vaccine. Get this vaccine each year before the flu season begins. Pneumonia vaccine. Shingles vaccine. Tetanus, diphtheria, and pertussis (Tdap) booster vaccine. Your health care provider may also recommend other immunizations. Tell your health care provider if you have ever been abused or do not feel safe at home. Summary Menopause is a normal process in which your ability to get pregnant comes to an end. This condition causes hot flashes, night sweats, decreased interest in sex, mood swings, headaches, or lack  of sleep. Treatment for this condition may include hormone replacement therapy. Take actions to keep yourself healthy, including exercising regularly, eating a healthy diet, watching your weight, and checking your blood pressure and blood sugar levels. Get screened for cancer and depression. Make sure that you are up to date with all your vaccines. This information is not intended to replace advice given to you by your health care provider. Make sure you discuss any questions you have with your health care provider. Document Revised: 01/28/2021 Document Reviewed: 01/28/2021 [employer] Patient Education  2024 [employer]  

## 2023-03-30 NOTE — Assessment & Plan Note (Signed)
Encourage diet and exercise for weight loss 

## 2023-03-31 LAB — COMPLETE METABOLIC PANEL WITH GFR
AG Ratio: 1.7 (calc) (ref 1.0–2.5)
ALT: 11 U/L (ref 6–29)
AST: 13 U/L (ref 10–35)
Albumin: 3.8 g/dL (ref 3.6–5.1)
Alkaline phosphatase (APISO): 49 U/L (ref 37–153)
BUN/Creatinine Ratio: 23 (calc) — ABNORMAL HIGH (ref 6–22)
BUN: 12 mg/dL (ref 7–25)
CO2: 25 mmol/L (ref 20–32)
Calcium: 9.7 mg/dL (ref 8.6–10.4)
Chloride: 114 mmol/L — ABNORMAL HIGH (ref 98–110)
Creat: 0.53 mg/dL — ABNORMAL LOW (ref 0.60–0.95)
Globulin: 2.3 g/dL (calc) (ref 1.9–3.7)
Glucose, Bld: 90 mg/dL (ref 65–99)
Potassium: 4 mmol/L (ref 3.5–5.3)
Sodium: 144 mmol/L (ref 135–146)
Total Bilirubin: 0.9 mg/dL (ref 0.2–1.2)
Total Protein: 6.1 g/dL (ref 6.1–8.1)
eGFR: 93 mL/min/{1.73_m2} (ref >=60)

## 2023-03-31 LAB — LIPID PANEL
Cholesterol: 128 mg/dL (ref <200)
HDL: 49 mg/dL — ABNORMAL LOW (ref >=50)
LDL Cholesterol (Calc): 57 mg/dL (calc)
Non-HDL Cholesterol (Calc): 79 mg/dL (calc) (ref <130)
Total CHOL/HDL Ratio: 2.6 (calc) (ref <5.0)
Triglycerides: 141 mg/dL (ref <150)

## 2023-03-31 LAB — HEMOGLOBIN A1C
Hgb A1c MFr Bld: 5.3 % of total Hgb (ref <5.7)
Mean Plasma Glucose: 105 mg/dL
eAG (mmol/L): 5.8 mmol/L

## 2023-03-31 LAB — T4, FREE: Free T4: 2.2 ng/dL — ABNORMAL HIGH (ref 0.8–1.8)

## 2023-03-31 LAB — CBC
HCT: 40.1 % (ref 35.0–45.0)
Hemoglobin: 13.4 g/dL (ref 11.7–15.5)
MCHC: 33.4 g/dL (ref 32.0–36.0)
MPV: 12.6 fL — ABNORMAL HIGH (ref 7.5–12.5)
RBC: 3.87 10*6/uL (ref 3.80–5.10)
RDW: 12.5 % (ref 11.0–15.0)

## 2023-03-31 LAB — TSH: TSH: <0.01 mIU/L — ABNORMAL LOW (ref 0.40–4.50)

## 2023-04-01 ENCOUNTER — Other Ambulatory Visit: Payer: Self-pay

## 2023-04-08 NOTE — Progress Notes (Signed)
Referring Physician:  [name], NP [address] [place],  [place] [address]  Primary Physician:  [name], NP  History of Present Illness: 04/15/2023 [name] has a history of HTN, hypothyroidism, hyperparathyroidism, thrombocytopenia, hyperlipidemia, and RLS.   History of L4-L5 decompression and SCS. SCS has been removed.   She complains of almost a year history of intermittent LBP that radiates into right buttock to lateral right leg to her knee, and occasionally to the foot. Pain is worse with walking and standing. No relief with grocery cart. No numbness or tingling. Feels some weakness in right leg.   She still smokes.   Bowel/Bladder Dysfunction: none  Conservative measures:  Physical therapy:  4 visits at [facility] recently with no relief.  Multimodal medical therapy including regular antiinflammatories: tylenol  Injections:  03/23/14 bilateral L3-L4 F ESI  01/03/14: right L4-5 TFESI 02/14/14: bilateral hip injection   Past Surgery:  L4-L5 decompression  SCS placed but has been removed in 2023  Izola Price Deford has no symptoms of cervical myelopathy.  The symptoms are causing a significant impact on the patient's life.   Review of Systems:  A 10 point review of systems is negative, except for the pertinent positives and negatives detailed in the HPI.  Past Medical History: Past Medical History:  Diagnosis Date   Allergy    Anemia    Anxiety    Arthritis    Cancer (HCC)    skin cancer   Hyperlipidemia    Hypertension    Hypothyroidism    Sciatica    Sleep apnea    does not use cpap   Thrombocytopenia (HCC)    Thyroid disease     Past Surgical History: Past Surgical History:  Procedure Laterality Date   ABDOMINAL HYSTERECTOMY     APPENDECTOMY     BACK SURGERY     lower   BILATERAL CARPAL TUNNEL RELEASE     BREAST CYST ASPIRATION Left    neg   COLONOSCOPY     COLONOSCOPY WITH PROPOFOL N/A 04/23/2021   Procedure: COLONOSCOPY  WITH PROPOFOL;  Surgeon: Pasty Spillers, MD;  Location: [facility];  Service: Endoscopy;  Laterality: N/A;   ENTROPIAN REPAIR Left 06/08/2020   Procedure: ENTROPION REPAIR, SUTURES ENTROPION REPAIR, EXTENSIVE LEFT;  Surgeon: Imagene Riches, MD;  Location: [facility];  Service: Ophthalmology;  Laterality: Left;   JOINT REPLACEMENT     knees   KNEE SURGERY Bilateral    KNEE SURGERY     OPEN REDUCTION INTERNAL FIXATION (ORIF) DISTAL RADIAL FRACTURE Left 06/23/2019   Procedure: OPEN REDUCTION INTERNAL FIXATION (ORIF) DISTAL RADIAL FRACTURE;  Surgeon: Kennedy Bucker, MD;  Location: [facility] ORS;  Service: Orthopedics;  Laterality: Left;   PARATHYROIDECTOMY  04/27/2020   salivary stone remove     SPINAL CORD STIMULATOR IMPLANT Right    SPINAL CORD STIMULATOR REMOVAL N/A 07/28/2022   Procedure: REMOVAL OF SPINAL CORD STIMULATOR;  Surgeon: Venetia Night, MD;  Location: [facility] OR[facility];  Service: Neurosurgery;  Laterality: N/A;   THYROIDECTOMY Right 04/27/2020   Procedure: PARATHYROIDECTOMY;  Surgeon: Luretha Murphy, MD;  Location: [facility];  Service: General;  Laterality: Right;   TOTAL KNEE REVISION Right 08/13/2015   Procedure: TOTAL KNEE REVISION;  Surgeon: Durene Romans, MD;  Location: [facility];  Service: Orthopedics;  Laterality: Right;    Allergies: Allergies as of 04/15/2023 - Review Complete 04/15/2023  Allergen Reaction Noted   Codeine Nausea Only 02/22/2014   Morphine Nausea And  Vomiting 06/12/2015   Ace inhibitors Cough 03/16/2018   Adhesive [tape] Other (See Comments) and Rash 02/22/2014    Medications: Outpatient Encounter Medications as of 04/15/2023  Medication Sig   acetaminophen (TYLENOL) 500 MG tablet Take 500 mg by mouth every 6 (six) hours as needed for moderate pain.    amLODipine (NORVASC) 10 MG tablet TAKE 1 TABLET(10 MG) BY MOUTH DAILY   aspirin EC 81 MG tablet Take 81 mg by mouth daily. Swallow whole.   atorvastatin (LIPITOR) 40 MG tablet Take 1 tablet (40 mg  total) by mouth daily.   citalopram (CELEXA) 20 MG tablet TAKE 1 TABLET(20 MG) BY MOUTH DAILY   levothyroxine (SYNTHROID) 150 MCG tablet TAKE 1 TABLET(150 MCG) BY MOUTH DAILY   losartan (COZAAR) 100 MG tablet TAKE 1 TABLET(100 MG) BY MOUTH DAILY   metoprolol succinate (TOPROL-XL) 50 MG 24 hr tablet TAKE 1 TABLET BY MOUTH EVERY DAY WITH OR IMMEDIATELY FOLLOWING A MEAL   Multiple Vitamin (MULTIVITAMIN) capsule Take 1 capsule by mouth daily.   rOPINIRole (REQUIP) 0.5 MG tablet TAKE 1 TABLET(0.5 MG) BY MOUTH AT BEDTIME   traZODone (DESYREL) 50 MG tablet TAKE 1 TABLET(50 MG) BY MOUTH AT BEDTIME AS NEEDED FOR SLEEP   [DISCONTINUED] amLODipine (NORVASC) 10 MG tablet TAKE 1 TABLET(10 MG) BY MOUTH DAILY   [DISCONTINUED] aspirin 325 MG tablet Take 325 mg by mouth daily.   [DISCONTINUED] citalopram (CELEXA) 20 MG tablet TAKE 1 TABLET(20 MG) BY MOUTH DAILY   [DISCONTINUED] levothyroxine (SYNTHROID) 150 MCG tablet Take 1 tablet (150 mcg total) by mouth daily.   [DISCONTINUED] metoprolol succinate (TOPROL-XL) 50 MG 24 hr tablet TAKE 1 TABLET BY MOUTH EVERY DAY WITH OR IMMEDIATELY FOLLOWING A MEAL   [DISCONTINUED] traZODone (DESYREL) 50 MG tablet TAKE 1 TABLET(50 MG) BY MOUTH AT BEDTIME AS NEEDED FOR SLEEP   No facility-administered encounter medications on file as of 04/15/2023.    Social History: Social History   Tobacco Use   Smoking status: Every Day    Current packs/day: 0.50    Average packs/day: 0.5 packs/day for 40.0 years (20.0 ttl pk-yrs)    Types: Cigarettes   Smokeless tobacco: Never  Vaping Use   Vaping status: Never Used  Substance Use Topics   Alcohol use: No   Drug use: No    Family Medical History: Family History  Problem Relation Age of Onset   Dementia Mother    Dementia Sister    Heart attack Brother    Diabetes Brother    Breast cancer Paternal Aunt    Heart attack Son    Hypercalcemia Neg Hx    Cancer Neg Hx     Physical Examination: Vitals:   04/15/23 1412   BP: 114/74    General: Patient is well developed, well nourished, calm, collected, and in no apparent distress. Attention to examination is appropriate.  Respiratory: Patient is breathing without any difficulty.   NEUROLOGICAL:     Awake, alert, oriented to person, place, and time.  Speech is clear and fluent. Fund of knowledge is appropriate.   Cranial Nerves: Pupils equal round and reactive to light.  Facial tone is symmetric.    Well healed lumbar incision.  Mild right sided lower lumbar tenderness.   No abnormal lesions on exposed skin.   Strength: Side Biceps Triceps Deltoid Interossei Grip Wrist Ext. Wrist Flex.  R 5 5 5 5 5 5 5   L 5 5 5 5 5 5 5    Side Iliopsoas Quads Hamstring PF  DF EHL  R 5 5 5 5 5 5   L 5 5 5 5 5 5    Reflexes are 2+ and symmetric at the biceps, brachioradialis, patella and achilles.   Hoffman's is absent.  Clonus is not present.   Bilateral upper and lower extremity sensation is intact to light touch.     Gait is slow and she takes small steps.   Medical Decision Making  Imaging: Lumbar MRI dated 02/12/23:  FINDINGS: Segmentation:  5 lumbar type vertebrae   Alignment:  Mild levocurvature   Vertebrae: No fracture, evidence of discitis, or bone lesion. Mild marrow edema at the bilateral L5-S1 facets.   Conus medullaris and cauda equina: Conus extends to the L1-2 level. Conus and cauda equina appear normal.   Paraspinal and other soft tissues: Fatty atrophy of intrinsic back muscles. Postoperative scarring.   Disc levels:   T12- L1: Mild facet spurring and ligamentum flavum thickening. No impingement   L1-L2: Mild disc bulging and facet spurring.  No neural impingement   L2-L3: Disc narrowing and circumferential bulging. Mild bilateral foraminal narrowing.   L3-L4: Disc narrowing and bulging with bilateral inferior foraminal protrusion. Degenerative facet spurring on both sides. Moderate left foraminal stenosis.   L4-L5: Disc  collapse with endplate ridging and facet spurring eccentric to the left where there is moderate foraminal stenosis. Patent spinal canal after laminectomy   L5-S1:Degenerative facet spurring which is bulky. The disc is narrowed and circumferentially bulging with mild bilateral foraminal narrowing based on axial images. Mild spinal stenosis.   IMPRESSION: 1. Generalized lumbar spine degeneration with little progression since a 2014 CT. 2. 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 3. L4-5 laminectomy with wide patency of the spinal canal. 4. 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     Electronically Signed   By: Tiburcio Pea M.D.   On: 02/21/2023 10:04    I have personally reviewed the images and agree with the above interpretation.  Assessment and Plan: Ms. Zoltowski is a pleasant 82 y.o. female has a history of L4-L5 decompression and SCS. SCS has been removed.   She has almost a year history of intermittent LBP that radiates into right buttock to lateral right leg to her knee, and occasionally to the foot. Pain is worse with walking and standing.   She has known lumbar spondylosis with DDD. She has mild central stenosis at L5-S1 with mild bilateral foraminal stenosis.   LBP likely from facets/spondylosis. Right leg pain may be from L5-S1.   Treatment options discussed with patient and following plan made:   - No relief with PT x 4 visits. She does not want to revisit this.  - She can take OTC tylenol for pain. Reviewed dosing and side effects. Take with food.   - Referral to PMR at [facility] to discuss possible lumbar injections.  - Will schedule phone visit with me in 6-8 weeks.   I spent a total of 30 minutes in face-to-face and non-face-to-face activities related to this patient's care today including review of outside records, review of imaging, review of symptoms, physical exam, discussion of differential diagnosis, discussion of treatment options,  and documentation.   Thank you for involving me in the care of this patient.   Drake Leach PA-C Dept. of Neurosurgery

## 2023-04-09 ENCOUNTER — Other Ambulatory Visit: Payer: Self-pay | Admitting: Internal Medicine

## 2023-04-09 DIAGNOSIS — J029 Acute pharyngitis, unspecified: Secondary | ICD-10-CM

## 2023-04-09 DIAGNOSIS — F419 Anxiety disorder, unspecified: Secondary | ICD-10-CM

## 2023-04-09 DIAGNOSIS — F5104 Psychophysiologic insomnia: Secondary | ICD-10-CM

## 2023-04-09 DIAGNOSIS — I10 Essential (primary) hypertension: Secondary | ICD-10-CM

## 2023-04-10 NOTE — Telephone Encounter (Signed)
Requested medications are due for refill today.  yes  Requested medications are on the active medications list.  yes  Last refill. 02/09/2023 #90 0 rf  Future visit scheduled.   yes  Notes to clinic.  Abnormal labs.    Requested Prescriptions  Pending Prescriptions Disp Refills   levothyroxine (SYNTHROID) 150 MCG tablet [Pharmacy Med Name: LEVOTHYROXINE 0.150MG  ( ) TAB] 90 tablet 0    Sig: TAKE 1 TABLET(150 MCG) BY MOUTH DAILY     Endocrinology:  Hypothyroid Agents Failed - 04/09/2023 10:56 AM      Failed - TSH in normal range and within 360 days    TSH  Date Value Ref Range Status  03/30/2023 <0.01 (L) 0.40 - 4.50 mIU/L Final         Passed - Valid encounter within last 12 months    Recent Outpatient Visits           1 week ago Right sided sciatica   [facility] [facility] [place], Salvadore Oxford, NP   2 months ago Acquired hypothyroidism   [place] [facility] [place], Salvadore Oxford, NP   4 months ago Other eczema   [place] [facility] [place], Salvadore Oxford, NP   5 months ago Acquired hypothyroidism   [place] [facility] [place], Salvadore Oxford, NP   6 months ago Pure hypercholesterolemia   [place] [facility] [place], Salvadore Oxford, NP       Future Appointments             In 5 months Baity, Salvadore Oxford, NP [place] [facility]            Signed Prescriptions Disp Refills   amLODipine (NORVASC) 10 MG tablet 90 tablet 1    Sig: TAKE 1 TABLET(10 MG) BY MOUTH DAILY     Cardiovascular: Calcium Channel Blockers 2 Passed - 04/09/2023 10:56 AM      Passed - Last BP in normal range    BP Readings from Last 1 Encounters:  03/30/23 124/62         Passed - Last Heart Rate in normal range    Pulse Readings from Last 1 Encounters:  03/30/23 (!) 58         Passed - Valid encounter within last 6 months    Recent Outpatient Visits           1 week ago Right sided sciatica    [place] [facility] [place], Salvadore Oxford, NP   2 months ago Acquired hypothyroidism   [place] [facility] [place], Salvadore Oxford, NP   4 months ago Other eczema   [place] [facility] [place], Salvadore Oxford, NP   5 months ago Acquired hypothyroidism   [facility] [place], Salvadore Oxford, NP   6 months ago Pure hypercholesterolemia   [facility] [place], Salvadore Oxford, NP       Future Appointments             In 5 months Baity, Salvadore Oxford, NP [place] [facility]             citalopram (CELEXA) 20 MG tablet 90 tablet 1    Sig: TAKE 1 TABLET(20 MG) BY MOUTH DAILY     Psychiatry:  Antidepressants - SSRI Passed - 04/09/2023 10:56 AM  Passed - Valid encounter within last 6 months    Recent Outpatient Visits           1 week ago Right sided sciatica   [place] [facility] [place], Salvadore Oxford, NP   2 months ago Acquired hypothyroidism   [place] [facility] [place], Salvadore Oxford, NP   4 months ago Other eczema   [place] [facility] [place], Salvadore Oxford, NP   5 months ago Acquired hypothyroidism   [place] [facility] [place], Salvadore Oxford, NP   6 months ago Pure hypercholesterolemia   [place] [facility] [place], Salvadore Oxford, NP       Future Appointments             In 5 months Baity, Salvadore Oxford, NP [facility]             metoprolol succinate (TOPROL-XL) 50 MG 24 hr tablet 90 tablet 1    Sig: TAKE 1 TABLET BY MOUTH EVERY DAY WITH OR IMMEDIATELY FOLLOWING A MEAL     Cardiovascular:  Beta Blockers Passed - 04/09/2023 10:56 AM      Passed - Last BP in normal range    BP Readings from Last 1 Encounters:  03/30/23 124/62         Passed - Last Heart Rate in normal range    Pulse Readings from Last 1 Encounters:  03/30/23 (!) 58         Passed - Valid  encounter within last 6 months    Recent Outpatient Visits           1 week ago Right sided sciatica   [place] [facility] [place], Salvadore Oxford, NP   2 months ago Acquired hypothyroidism   [place] [facility] [place], Salvadore Oxford, NP   4 months ago Other eczema   [place] [facility] [place], Salvadore Oxford, NP   5 months ago Acquired hypothyroidism   [place] [facility] [place], Salvadore Oxford, NP   6 months ago Pure hypercholesterolemia   [place] [facility] [place], Salvadore Oxford, NP       Future Appointments             In 5 months Baity, Salvadore Oxford, NP [place] [facility]             traZODone (DESYREL) 50 MG tablet 90 tablet 1    Sig: TAKE 1 TABLET(50 MG) BY MOUTH AT BEDTIME AS NEEDED FOR SLEEP     Psychiatry: Antidepressants - Serotonin Modulator Passed - 04/09/2023 10:56 AM      Passed - Valid encounter within last 6 months    Recent Outpatient Visits           1 week ago Right sided sciatica   [place] [facility] [place], Salvadore Oxford, NP   2 months ago Acquired hypothyroidism   [place] [facility] [place], Salvadore Oxford, NP   4 months ago Other eczema   [place] [facility] [place], Salvadore Oxford, NP   5 months ago Acquired hypothyroidism   [place] [facility] [place], Salvadore Oxford, NP   6 months ago Pure hypercholesterolemia   [place] [facility] [place], Salvadore Oxford, NP       Future Appointments  In 5 months Baity, Salvadore Oxford, NP [place] [facility]

## 2023-04-10 NOTE — Telephone Encounter (Signed)
Requested Prescriptions  Pending Prescriptions Disp Refills   amLODipine (NORVASC) 10 MG tablet [Pharmacy Med Name: AMLODIPINE BESYLATE 10MG  TABLETS] 90 tablet 1    Sig: TAKE 1 TABLET(10 MG) BY MOUTH DAILY     Cardiovascular: Calcium Channel Blockers 2 Passed - 04/09/2023 10:56 AM      Passed - Last BP in normal range    BP Readings from Last 1 Encounters:  03/30/23 124/62         Passed - Last Heart Rate in normal range    Pulse Readings from Last 1 Encounters:  03/30/23 (!) 58         Passed - Valid encounter within last 6 months    Recent Outpatient Visits           1 week ago Right sided sciatica   [place] [facility] [place], Salvadore Oxford, NP   2 months ago Acquired hypothyroidism   [place] [facility] [place], Salvadore Oxford, NP   4 months ago Other eczema   [place] [facility] [place], Salvadore Oxford, NP   5 months ago Acquired hypothyroidism   [place] [facility] [place], Salvadore Oxford, NP   6 months ago Pure hypercholesterolemia   [place] [facility] [place], Salvadore Oxford, NP       Future Appointments             In 5 months Baity, Salvadore Oxford, NP [place] [facility]             citalopram (CELEXA) 20 MG tablet [Pharmacy Med Name: CITALOPRAM 20MG  TABLETS] 90 tablet 1    Sig: TAKE 1 TABLET(20 MG) BY MOUTH DAILY     Psychiatry:  Antidepressants - SSRI Passed - 04/09/2023 10:56 AM      Passed - Valid encounter within last 6 months    Recent Outpatient Visits           1 week ago Right sided sciatica   [place] [facility] [place], Salvadore Oxford, NP   2 months ago Acquired hypothyroidism   [place] [facility] [place], Salvadore Oxford, NP   4 months ago Other eczema   [place] [facility] [place], Salvadore Oxford, NP   5 months ago Acquired hypothyroidism   [place] [facility] [place], Salvadore Oxford, NP   6 months ago Pure  hypercholesterolemia   [place] [facility] [place], Salvadore Oxford, NP       Future Appointments             In 5 months Baity, Salvadore Oxford, NP [place] [facility]             levothyroxine (SYNTHROID) 150 MCG tablet [Pharmacy Med Name: LEVOTHYROXINE 0.150MG  ( ) TAB] 90 tablet 0    Sig: TAKE 1 TABLET(150 MCG) BY MOUTH DAILY     Endocrinology:  Hypothyroid Agents Failed - 04/09/2023 10:56 AM      Failed - TSH in normal range and within 360 days    TSH  Date Value Ref Range Status  03/30/2023 <0.01 (L) 0.40 - 4.50 mIU/L Final         Passed - Valid encounter within last 12 months    Recent Outpatient Visits           1 week ago Right sided sciatica   [place] [facility] [place], Salvadore Oxford, NP   2 months ago Acquired hypothyroidism   [place] [facility] [place], Salvadore Oxford, NP   4 months ago Other eczema   [place] [facility] [place], Salvadore Oxford, NP   5 months ago Acquired hypothyroidism   [place] [facility], Salvadore Oxford, NP   6 months ago Pure hypercholesterolemia   [place] [facility] [place], Salvadore Oxford, NP       Future Appointments             In 5 months Baity, Salvadore Oxford, NP [facility]             metoprolol succinate (TOPROL-XL) 50 MG 24 hr tablet [Pharmacy Med Name: METOPROLOL ER SUCCINATE 50MG  TABS] 90 tablet 1    Sig: TAKE 1 TABLET BY MOUTH EVERY DAY WITH OR IMMEDIATELY FOLLOWING A MEAL     Cardiovascular:  Beta Blockers Passed - 04/09/2023 10:56 AM      Passed - Last BP in normal range    BP Readings from Last 1 Encounters:  03/30/23 124/62         Passed - Last Heart Rate in normal range    Pulse Readings from Last 1 Encounters:  03/30/23 (!) 58         Passed - Valid encounter within last 6 months    Recent Outpatient Visits           1 week ago Right sided sciatica   [place]  [facility] [place], Salvadore Oxford, NP   2 months ago Acquired hypothyroidism   [place] [facility] [place], Salvadore Oxford, NP   4 months ago Other eczema   [place] [facility] [place], Salvadore Oxford, NP   5 months ago Acquired hypothyroidism   [place] [facility] [place], Salvadore Oxford, NP   6 months ago Pure hypercholesterolemia   [place] [facility] [place], Salvadore Oxford, NP       Future Appointments             In 5 months Baity, Salvadore Oxford, NP [facility]             traZODone (DESYREL) 50 MG tablet [Pharmacy Med Name: TRAZODONE 50MG  TABLETS] 90 tablet 1    Sig: TAKE 1 TABLET(50 MG) BY MOUTH AT BEDTIME AS NEEDED FOR SLEEP     Psychiatry: Antidepressants - Serotonin Modulator Passed - 04/09/2023 10:56 AM      Passed - Valid encounter within last 6 months    Recent Outpatient Visits           1 week ago Right sided sciatica   [place] [facility] [place], Salvadore Oxford, NP   2 months ago Acquired hypothyroidism   [place] [facility] [place], Salvadore Oxford, NP   4 months ago Other eczema   [place] [facility] [place], Salvadore Oxford, NP   5 months ago Acquired hypothyroidism   [place] [facility] [place], Salvadore Oxford, NP   6 months ago Pure hypercholesterolemia    [place] [facility] [place], Salvadore Oxford, NP       Future Appointments             In 5 months [place], Salvadore Oxford, NP [facility] [place]  [facility]

## 2023-04-15 ENCOUNTER — Encounter: Payer: Self-pay | Admitting: Orthopedic Surgery

## 2023-04-15 ENCOUNTER — Ambulatory Visit: Payer: PPO | Admitting: Orthopedic Surgery

## 2023-04-15 VITALS — BP 114/74 | Ht 61.5 in | Wt 146.0 lb

## 2023-04-15 DIAGNOSIS — Z9889 Other specified postprocedural states: Secondary | ICD-10-CM

## 2023-04-15 DIAGNOSIS — M48061 Spinal stenosis, lumbar region without neurogenic claudication: Secondary | ICD-10-CM

## 2023-04-15 DIAGNOSIS — M5186 Other intervertebral disc disorders, lumbar region: Secondary | ICD-10-CM

## 2023-04-15 DIAGNOSIS — M47816 Spondylosis without myelopathy or radiculopathy, lumbar region: Secondary | ICD-10-CM

## 2023-04-15 NOTE — Patient Instructions (Signed)
It was so nice to see you today. Thank you so much for coming in.    You have some wear and tear in your back (arthritis) and I think this is what is causing your pain.   You can take the over the counter tylenol 500mg  as needed. You can take 2 pills up to 3 times a day. Do not take more than this and take them with food.   I want you to see physical medicine and rehab at the [facility] to discuss possible injections in your lower back. Dr. Yves Dill, Dr. Mariah Milling, and their PA Alphonzo Lemmings are great and will take good care of you. They should call you to schedule an appointment or you can call them at [phone].   We have a phone visit scheduled in 6-8 weeks. I will call you to check on you.   Please do not hesitate to call if you have any questions or concerns. You can also message me in MyChart.  Drake Leach PA-C [phone]

## 2023-04-17 NOTE — Progress Notes (Signed)
[facility] has no record of vaccine

## 2023-04-22 DIAGNOSIS — F17210 Nicotine dependence, cigarettes, uncomplicated: Secondary | ICD-10-CM

## 2023-04-22 DIAGNOSIS — I10 Essential (primary) hypertension: Secondary | ICD-10-CM

## 2023-04-22 DIAGNOSIS — J449 Chronic obstructive pulmonary disease, unspecified: Secondary | ICD-10-CM

## 2023-04-27 ENCOUNTER — Ambulatory Visit: Payer: Self-pay

## 2023-04-27 NOTE — Telephone Encounter (Signed)
The patient called in stating her levothyroxine (SYNTHROID) 150 MCG tablet is  not working because her thyroid is acting up. Please assist patient further    Chief Complaint: "My thyroid is hurting me." Pain to front of neck. Constant, "not getting any better." No availability, asking to be worked in. Symptoms: Above Frequency: 3 days ago Pertinent Negatives: Patient denies fever Disposition: [] ED /[] Urgent Care (no appt availability in office) / [] Appointment(In office/virtual)/ []  [facility]/ [] Home Care/ [] Refused Recommended Disposition /[] [place] [facility]/ [x]  Follow-up with PCP Additional Notes: Please advise pt.  Reason for Disposition  [1] MODERATE neck pain (e.g., interferes with normal activities) AND [2] present > 3 days  Answer Assessment - Initial Assessment Questions 1. ONSET: "When did the pain begin?"      3 days 2. LOCATION: "Where does it hurt?"      Front of neck 3. PATTERN "Does the pain come and go, or has it been constant since it started?"      Constant 4. SEVERITY: "How bad is the pain?"  (Scale 1-10; or mild, moderate, severe)   - NO PAIN (0): no pain or only slight stiffness    - MILD (1-3): doesn't interfere with normal activities    - MODERATE (4-7): interferes with normal activities or awakens from sleep    - SEVERE (8-10):  excruciating pain, unable to do any normal activities      Mild 5. RADIATION: "Does the pain go anywhere else, shoot into your arms?"     No 6. CORD SYMPTOMS: "Any weakness or numbness of the arms or legs?"     No 7. CAUSE: "What do you think is causing the neck pain?"     Thyroid 8. NECK OVERUSE: "Any recent activities that involved turning or twisting the neck?"     No 9. OTHER SYMPTOMS: "Do you have any other symptoms?" (e.g., headache, fever, chest pain, difficulty breathing, neck swelling)     No 10. PREGNANCY: "Is there any chance you are pregnant?" "When was your last menstrual period?"        No  Protocols used: Neck Pain or Stiffness-A-AH

## 2023-04-27 NOTE — Telephone Encounter (Signed)
Advised pt she would need to go to urgent care to be evaluated.  (We do not have openings until next week.)  She wanted to wait and see Rene Kocher I have schedule her for 08/15 at 1:20 but told her if she develops new or worsening symptoms to go to the [facility] or ED.  Pt agreed.   Thanks,   -Vernona Rieger

## 2023-05-04 ENCOUNTER — Ambulatory Visit: Payer: PPO | Admitting: Pharmacist

## 2023-05-04 DIAGNOSIS — I10 Essential (primary) hypertension: Secondary | ICD-10-CM

## 2023-05-04 DIAGNOSIS — E039 Hypothyroidism, unspecified: Secondary | ICD-10-CM

## 2023-05-05 NOTE — Patient Instructions (Signed)
Visit Information  Thank you for taking time to visit with me today. Please don't hesitate to contact me if I can be of assistance to you before our next scheduled telephone appointment.  Following are the goals we discussed today:   Goals Addressed             This Visit's Progress    Pharmacy Goals       Please obtain new upper arm blood pressure monitor and restart checking your home blood pressure  We recommend a blood pressure cuff that goes around your upper arm, as these are generally the most accurate.   To appropriately check your blood pressure, make sure you do the following:  1) Avoid caffeine, exercise, or tobacco products for 30 minutes before checking. 2) Sit with your back supported in a flat-backed chair. Rest your arm on something flat (arm of the chair, table, etc). 3) Sit still with your feet flat on the floor, resting, for at least 5 minutes.  4) Check your blood pressure. Take 1-2 readings.   Write down these readings and bring with you to any provider appointments. Bring your home blood pressure machine with you to a provider's office for accuracy comparison at least once a year. Make sure you take your blood pressure medications before you come to any office visit.  Feel free to call me with any questions or concerns. I look forward to our next call!   Estelle Grumbles, PharmD, BCACP Clinical Pharmacist [facility] [phone]         Our next appointment is by telephone on 08/17/2023 at 11:15 am  Please call the care guide team at [phone] if you need to cancel or reschedule your appointment.   Patient verbalizes understanding of instructions and care plan provided today and agrees to view in MyChart. Active MyChart status and patient understanding of how to access instructions and care plan via MyChart confirmed with patient.

## 2023-05-05 NOTE — Chronic Care Management (AMB) (Signed)
Chronic Care Management CCM Pharmacy Note  05/05/2023 Name:  [name] MRN:  [id number] DOB:  11/03/1940  Subjective: [name] is an 82 y.o. year old female who is a primary patient of [name], NP.  The CCM team was consulted for assistance with disease management and care coordination needs.    Engaged with patient by telephone for follow up visit for pharmacy case management and/or care coordination services.   Objective:  Medications Reviewed Today     Reviewed by Manuela Neptune, RPH-CPP (Pharmacist) on 05/05/23 at [phone]  Med List Status: <None>   Medication Order Taking? Sig Documenting Provider Last Dose Status Informant  acetaminophen (TYLENOL) 500 MG tablet [id number] No Take 500 mg by mouth every 6 (six) hours as needed for moderate pain.  [provider] Taking Active Self  amLODipine (NORVASC) 10 MG tablet [id number] No TAKE 1 TABLET(10 MG) BY MOUTH DAILY [name], NP Taking Active   aspirin EC 81 MG tablet [id number] No Take 81 mg by mouth daily. Swallow whole. [provider] Taking Active   atorvastatin (LIPITOR) 40 MG tablet [id number] No Take 1 tablet (40 mg total) by mouth daily. [name], NP Taking Active   citalopram (CELEXA) 20 MG tablet [id number] No TAKE 1 TABLET(20 MG) BY MOUTH DAILY [name], NP Taking Active   levothyroxine (SYNTHROID) 150 MCG tablet [id number] No TAKE 1 TABLET(150 MCG) BY MOUTH DAILY [name], NP Taking Active   losartan (COZAAR) 100 MG tablet [id number] No TAKE 1 TABLET(100 MG) BY MOUTH DAILY Baity, Salvadore Oxford, NP Taking Active   metoprolol succinate (TOPROL-XL) 50 MG 24 hr tablet [id number] No TAKE 1 TABLET BY MOUTH EVERY DAY WITH OR IMMEDIATELY FOLLOWING A MEAL Baity, Salvadore Oxford, NP Taking Active   Multiple Vitamin (MULTIVITAMIN) capsule [id number] No Take 1 capsule by mouth daily. [provider] Taking Active   rOPINIRole (REQUIP) 0.5 MG tablet [id number] No TAKE 1  TABLET(0.5 MG) BY MOUTH AT BEDTIME [name], NP Taking Active   traZODone (DESYREL) 50 MG tablet [id number] No TAKE 1 TABLET(50 MG) BY MOUTH AT BEDTIME AS NEEDED FOR SLEEP [name], NP Taking Active             Pertinent Labs:   Lab Results  Component Value Date   HGBA1C 5.3 03/30/2023   Lab Results  Component Value Date   CHOL 128 03/30/2023   HDL 49 (L) 03/30/2023   LDLCALC 57 03/30/2023   TRIG 141 03/30/2023   CHOLHDL 2.6 03/30/2023   Lab Results  Component Value Date   NA 144 03/30/2023   CL 114 (H) 03/30/2023   K 4.0 03/30/2023   CO2 25 03/30/2023   BUN 12 03/30/2023   CREATININE 0.53 (L) 03/30/2023   EGFR 93 03/30/2023   CALCIUM 9.7 03/30/2023   ALBUMIN 4.3 02/11/2022   GLUCOSE 90 03/30/2023   BP Readings from Last 3 Encounters:  04/15/23 114/74  03/30/23 124/62  02/05/23 118/64   Pulse Readings from Last 3 Encounters:  03/30/23 (!) 58  02/05/23 76  12/01/22 (!) 56     SDOH:  (Social Determinants of Health) assessments and interventions performed:  SDOH Interventions    Flowsheet Row Office Visit from 03/30/2023 in [facility] Office Visit from 02/05/2023 in [facility] Clinical Support from 12/26/2022 in [facility] Office Visit from 12/01/2022 in [facility] Chronic Care Management from 08/11/2022 in [facility] Care Coordination from 06/02/2022 in [facility] Care Coordination  SDOH Interventions        Food Insecurity Interventions -- -- Intervention Not Indicated -- Intervention Not Indicated --  Housing Interventions -- -- Intervention Not Indicated -- Intervention Not Indicated --  Transportation Interventions -- -- Intervention Not Indicated -- Intervention Not Indicated --  Utilities Interventions -- -- Intervention Not Indicated -- Intervention Not Indicated Intervention Not  Indicated  Alcohol Usage Interventions -- -- Intervention Not Indicated (Score <7) -- Intervention Not Indicated (Score <7) --  Depression Interventions/Treatment  Patient refuses Treatment, Currently on Treatment Currently on Treatment -- Currently on Treatment -- --  Financial Strain Interventions -- -- Intervention Not Indicated -- Intervention Not Indicated --  Physical Activity Interventions -- -- Intervention Not Indicated -- -- --  Stress Interventions -- -- Intervention Not Indicated -- Intervention Not Indicated Intervention Not Indicated  Social Connections Interventions -- -- Intervention Not Indicated -- Intervention Not Indicated --       CCM Care Plan  Review of patient past medical history, allergies, medications, health status, including review of consultants reports, laboratory and other test data, was performed as part of comprehensive evaluation and provision of chronic care management services.   Care Plan : PharmD - Medication Management; HTN  Updates made by Manuela Neptune, RPH-CPP since 05/05/2023 12:00 AM     Problem: Disease Progression      Long-Range Goal: Disease Progression Prevented or Minimized   Start Date: 07/01/2021  Expected End Date: 09/29/2021  Recent Progress: On track  Priority: High  Note:   Current Barriers:  Unable to self administer medications as prescribed Lack of home blood pressure readings to share with clinical team  Pharmacist Clinical Goal(s):  patient will achieve adherence to monitoring guidelines and medication adherence to achieve therapeutic efficacy maintain control of hypertension as evidenced by blood pressure readings through collaboration with PharmD and provider.    Interventions: 1:1 collaboration with [name], NP regarding development and update of comprehensive plan of care as evidenced by provider attestation and co-signature Inter-disciplinary care team collaboration (see longitudinal plan of  care) From review of chart, note patient contacted office on 04/27/2023 related to neck pain/"my thyroid is hurting me". Patient advised to go to urgent care, but instead preferred to wait to see PCP on 8/15 Today patient states that she continues to feel like her neck is swollen, but prefers to wait to be seen by PCP on 8/15  Hypothyroidism:  Current treatment: levothyroxine 150 mcg QAM From review of lab result notes from 03/30/2023, note PCP advised patient to decrease her levothyroxine to 150 mcg daily Identify patient in need of refill of levothyroxine. Advise patient to contact pharmacy for refill  Hypertension:   Current treatment: Amlodipine 10 mg daily Metoprolol ER 50 mg QAM Losartan 100 mg daily Denies monitoring home blood pressure Denies hypertensive symptoms as long as she takes positional changes slowly Have counseled patient on home BP monitoring technique Have encouraged patient to start monitoring home BP, to keep a log of the results, have this record for [place] to review during telephone appointment, but to call office sooner or readings outside of established parameter or for new/worsening symptoms Patient denies interest in home monitoring at this time  Hyperlipidemia: Current treatment: atorvastatin 40 mg daily Have encouraged patient to decrease saturated and trans fats in her diet  Patient Goals/Self-Care Activities patient will:  - take medications as prescribed - check blood pressure, document, and provide at future appointments  Follow Up Plan: The care management team will reach out to the patient again in 3 months       Estelle Grumbles, PharmD, [facility] Clinical Pharmacist [facility] [phone]

## 2023-05-07 ENCOUNTER — Ambulatory Visit (INDEPENDENT_AMBULATORY_CARE_PROVIDER_SITE_OTHER): Payer: PPO | Admitting: Internal Medicine

## 2023-05-07 ENCOUNTER — Encounter: Payer: Self-pay | Admitting: Internal Medicine

## 2023-05-07 VITALS — BP 112/64 | HR 52 | Temp 96.2°F | Wt 146.0 lb

## 2023-05-07 DIAGNOSIS — M5431 Sciatica, right side: Secondary | ICD-10-CM | POA: Diagnosis not present

## 2023-05-07 DIAGNOSIS — E039 Hypothyroidism, unspecified: Secondary | ICD-10-CM | POA: Diagnosis not present

## 2023-05-07 MED ORDER — LIDOCAINE 5 % EX PTCH: 1.0000 | MEDICATED_PATCH | CUTANEOUS | 0 refills | Status: DC

## 2023-05-07 NOTE — Assessment & Plan Note (Signed)
Advised her to go to the pharmacy and pick up the levothyroxine 150 mcg and start taking this daily

## 2023-05-07 NOTE — Patient Instructions (Signed)
Sciatica  Sciatica is pain, weakness, tingling, or loss of feeling (numbness) along the sciatic nerve. The sciatic nerve starts in the lower back and goes down the back of each leg. Sciatica usually affects one side of the body. Sciatica usually goes away on its own or with treatment. Sometimes, sciatica may come back. What are the causes? This condition happens when the sciatic nerve is pinched or has pressure put on it. This may be caused by: A disk in between the bones of the spine bulging out too far (herniated disk). Changes in the spinal disks due to aging. A condition that affects a muscle in the butt. Extra bone growth near the sciatic nerve. A break (fracture) of the area between your hip bones (pelvis). Pregnancy. Tumor. This is rare. What increases the risk? You are more likely to develop this condition if you: Play sports that put pressure or stress on the spine. Have poor strength and ease of movement (flexibility). Have had a back injury or back surgery. Sit for long periods of time. Do activities that involve bending or lifting over and over again. Are very overweight (obese). What are the signs or symptoms? Symptoms can vary from mild to very bad. They may include: Any of these problems in the lower back, leg, hip, or butt: Mild tingling, loss of feeling, or dull aches. A burning feeling. Sharp pains. Loss of feeling in the back of the calf or the sole of the foot. Leg weakness. Very bad back pain that makes it hard to move. These symptoms may get worse when you cough, sneeze, or laugh. They may also get worse when you sit or stand for long periods of time. How is this treated? This condition often gets better without any treatment. However, treatment may include: Changing or cutting back on physical activity when you have pain. Exercising, including strengthening and stretching. Putting ice or heat on the affected area. Shots of medicines to relieve pain and  swelling or to relax your muscles. Surgery. Follow these instructions at home: Medicines Take over-the-counter and prescription medicines only as told by your doctor. Ask your doctor if you should avoid driving or using machines while you are taking your medicine. Managing pain     If told, put ice on the affected area. To do this: Put ice in a plastic bag. Place a towel between your skin and the bag. Leave the ice on for 20 minutes, 2-3 times a day. If your skin turns bright red, take off the ice right away to prevent skin damage. The risk of skin damage is higher if you cannot feel pain, heat, or cold. If told, put heat on the affected area. Do this as often as told by your doctor. Use the heat source that your doctor tells you to use, such as a moist heat pack or a heating pad. Place a towel between your skin and the heat source. Leave the heat on for 20-30 minutes. If your skin turns bright red, take off the heat right away to prevent burns. The risk of burns is higher if you cannot feel pain, heat, or cold. Activity  Return to your normal activities when your doctor says that it is safe. Avoid activities that make your symptoms worse. Take short rests during the day. When you rest for a long time, do some physical activity or stretching between periods of rest. Avoid sitting for a long time without moving. Get up and move around at least one time each   hour. Do exercises and stretches as told by your doctor. Do not lift anything that is heavier than 10 lb (4.5 kg). Avoid lifting heavy things even when you do not have symptoms. Avoid lifting heavy things over and over. When you lift objects, always lift in a way that is safe for your body. To do this, you should: Bend your knees. Keep the object close to your body. Avoid twisting. General instructions Stay at a healthy weight. Wear comfortable shoes that support your feet. Avoid wearing high heels. Avoid sleeping on a mattress  that is too soft or too hard. You might have less pain if you sleep on a mattress that is firm enough to support your back. Contact a doctor if: Your pain is not controlled by medicine. Your pain does not get better. Your pain gets worse. Your pain lasts longer than 4 weeks. You lose weight without trying. Get help right away if: You cannot control when you pee (urinate) or poop (have a bowel movement). You have weakness in any of these areas and it gets worse: Lower back. The area between your hip bones. Butt. Legs. You have redness or swelling of your back. You have a burning feeling when you pee. Summary Sciatica is pain, weakness, tingling, or loss of feeling (numbness) along the sciatic nerve. This may include the lower back, legs, hips, and butt. This condition happens when the sciatic nerve is pinched or has pressure put on it. Treatment often includes rest, exercise, medicines, and putting ice or heat on the affected area. This information is not intended to replace advice given to you by your health care provider. Make sure you discuss any questions you have with your health care provider. Document Revised: 12/16/2021 Document Reviewed: 12/16/2021 Else[employer] Patient Education  2024 [employer]  

## 2023-05-07 NOTE — Progress Notes (Signed)
Subjective:    Patient ID: [name], female    DOB: 1941-05-12, 82 y.o.   MRN: [id number]  HPI  Patient presents to clinic today with complaint of discomfort in her thyroid.  This has been an ongoing issue for years.  She describes the pain as a soreness.  She had a normal thyroid ultrasound 25366.  She is not taking levothyroxine as prescribed because she "must have run out".  Her recent TSH was low and her levothyroxine was decreased to 150 mcg daily.  She has also seen ENT in the past and she reports "they did nothing for me".  She does not want to go back to see ENT.  She also reports right  lower back pain. This started a "few months ago".  She is unable to describe the pain. The pain radiates down her right leg. She denies numbness, tingling or weakness of her right lower extremity. She denies recent falls. MRI from 01/2023 reviewed:  IMPRESSION: 1. Generalized lumbar spine degeneration with little progression since a 2014 CT. 2. 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 3. L4-5 laminectomy with wide patency of the spinal canal. 4. 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  She has tried physical therapy but has not gotten any significant improvement with this.  She reports she occasionally takes Tylenol OTC which helps take the edge off but she does not want to take too much of this.  She is denying urinary or vaginal symptoms.   Review of Systems     Past Medical History:  Diagnosis Date   Allergy    Anemia    Anxiety    Arthritis    Cancer (HCC)    skin cancer   Hyperlipidemia    Hypertension    Hypothyroidism    Sciatica    Sleep apnea    does not use cpap   Thrombocytopenia (HCC)    Thyroid disease     Current Outpatient Medications  Medication Sig Dispense Refill   acetaminophen (TYLENOL) 500 MG tablet Take 500 mg by mouth every 6 (six) hours as needed for moderate pain.      amLODipine (NORVASC) 10 MG tablet TAKE 1  TABLET(10 MG) BY MOUTH DAILY 90 tablet 1   aspirin EC 81 MG tablet Take 81 mg by mouth daily. Swallow whole.     atorvastatin (LIPITOR) 40 MG tablet Take 1 tablet (40 mg total) by mouth daily. 90 tablet 1   citalopram (CELEXA) 20 MG tablet TAKE 1 TABLET(20 MG) BY MOUTH DAILY 90 tablet 1   levothyroxine (SYNTHROID) 150 MCG tablet TAKE 1 TABLET(150 MCG) BY MOUTH DAILY 90 tablet 1   losartan (COZAAR) 100 MG tablet TAKE 1 TABLET(100 MG) BY MOUTH DAILY 90 tablet 1   metoprolol succinate (TOPROL-XL) 50 MG 24 hr tablet TAKE 1 TABLET BY MOUTH EVERY DAY WITH OR IMMEDIATELY FOLLOWING A MEAL 90 tablet 1   Multiple Vitamin (MULTIVITAMIN) capsule Take 1 capsule by mouth daily.     rOPINIRole (REQUIP) 0.5 MG tablet TAKE 1 TABLET(0.5 MG) BY MOUTH AT BEDTIME 90 tablet 3   traZODone (DESYREL) 50 MG tablet TAKE 1 TABLET(50 MG) BY MOUTH AT BEDTIME AS NEEDED FOR SLEEP 90 tablet 1   No current facility-administered medications for this visit.    Allergies  Allergen Reactions   Codeine Nausea Only    Tolerates oxycodone   Morphine Nausea And Vomiting   Ace Inhibitors Cough   Adhesive [Tape] Other (See  Comments) and Rash    whelps    Family History  Problem Relation Age of Onset   Dementia Mother    Dementia Sister    Heart attack Brother    Diabetes Brother    Breast cancer Paternal Aunt    Heart attack Son    Hypercalcemia Neg Hx    Cancer Neg Hx     Social History   Socioeconomic History   Marital status: Married    Spouse name: ROBERT   Number of children: 4   Years of education: Not on file   Highest education level: GED or equivalent  Occupational History   Occupation: retired   Occupation: Retired  Tobacco Use   Smoking status: Every Day    Current packs/day: 0.50    Average packs/day: 0.5 packs/day for 40.0 years (20.0 ttl pk-yrs)    Types: Cigarettes   Smokeless tobacco: Never  Vaping Use   Vaping status: Never Used  Substance and Sexual Activity   Alcohol use: No   Drug  use: No   Sexual activity: Not Currently  Other Topics Concern   Not on file  Social History Narrative   Not on file   Social Determinants of Health   Financial Resource Strain: Medium Risk (02/03/2023)   Overall Financial Resource Strain (CARDIA)    Difficulty of Paying Living Expenses: Somewhat hard  Food Insecurity: No Food Insecurity (02/03/2023)   Hunger Vital Sign    Worried About Running Out of Food in the Last Year: Never true    Ran Out of Food in the Last Year: Never true  Transportation Needs: No Transportation Needs (02/03/2023)   PRAPARE - Administrator, Civil Service (Medical): No    Lack of Transportation (Non-Medical): No  Physical Activity: Inactive (02/03/2023)   Exercise Vital Sign    Days of Exercise per Week: 0 days    Minutes of Exercise per Session: 30 min  Stress: Stress Concern Present (02/03/2023)   [employer] of Occupational Health - Occupational Stress Questionnaire    Feeling of Stress : To some extent  Social Connections: Moderately Isolated (02/03/2023)   Social Connection and Isolation Panel [NHANES]    Frequency of Communication with Friends and Family: Never    Frequency of Social Gatherings with Friends and Family: Never    Attends Religious Services: 1 to 4 times per year    Active Member of [employer] or Organizations: No    Attends Banker Meetings: Never    Marital Status: Married  Catering manager Violence: Not At Risk (12/26/2022)   Humiliation, Afraid, Rape, and Kick questionnaire    Fear of Current or Ex-Partner: No    Emotionally Abused: No    Physically Abused: No    Sexually Abused: No     Constitutional: Denies fever, malaise, fatigue, headache or abrupt weight changes.  HEENT: Patient reports discomfort in the area of her thyroid.  Denies eye pain, eye redness, ear pain, ringing in the ears, wax buildup, runny nose, nasal congestion, bloody nose, or sore throat. Respiratory: Denies difficulty breathing,  shortness of breath, cough or sputum production.   Cardiovascular: Denies chest pain, chest tightness, palpitations or swelling in the hands or feet.  Gastrointestinal: Denies abdominal pain, bloating, constipation, diarrhea or blood in the stool.  GU: Denies urgency, frequency, pain with urination, burning sensation, blood in urine, odor or discharge. Musculoskeletal: Patient reports right side low back pain.  Denies decrease in range of motion, difficulty with gait,  muscle pain or joint pain and swelling.  Skin: Denies redness, rashes, lesions or ulcercations.  Neurological: Patient reports insomnia, difficulty with memory, restless legs.  Denies dizziness, difficulty with speech or problems with balance and coordination.  Psych: Patient has a history of anxiety.  Denies anxiety, depression, SI/HI.  No other specific complaints in a complete review of systems (except as listed in HPI above).  Objective:   Physical Exam   BP 112/64 (BP Location: Left Arm, Patient Position: Sitting, Cuff Size: Normal)   Pulse (!) 52   Temp (!) 96.2 F (35.7 C) (Temporal)   Wt 146 lb (66.2 kg)   LMP  (LMP Unknown)   SpO2 97%   BMI 27.14 kg/m   Wt Readings from Last 3 Encounters:  04/15/23 146 lb (66.2 kg)  03/30/23 152 lb (68.9 kg)  02/05/23 157 lb (71.2 kg)    General: Appears her stated age, overweight, in NAD. Skin: Warm, dry and intact.  HEENT: Head: normal shape and size; Eyes: sclera white, no icterus, conjunctiva pink, PERRLA and EOMs intact;  Neck:  Neck supple, trachea midline. No masses, lumps or thyromegaly present.  Cardiovascular: Bradycardic with normal rhythm. S1,S2 noted.  No murmur, rubs or gallops noted.  Pulmonary/Chest: Normal effort and positive vesicular breath sounds. No respiratory distress. No wheezes, rales or ronchi noted.  Abdomen: No CVA tenderness noted. Musculoskeletal: Decreased flexion and extension of the spine.  Normal rotation of the spine.  No bony tenderness  noted over the spine.  Pain with palpation of the right parathoracic/lumbar muscles.  Strength 5/5 LLE, 4/5 RLE.  Slow shuffling gait without device. Neurological: Alert and oriented.     BMET    Component Value Date/Time   NA 144 03/30/2023 0914   NA 145 (H) 12/21/2017 1017   NA 139 08/26/2012 2035   K 4.0 03/30/2023 0914   K 3.3 (L) 08/26/2012 2035   CL 114 (H) 03/30/2023 0914   CL 105 08/26/2012 2035   CO2 25 03/30/2023 0914   CO2 29 08/26/2012 2035   GLUCOSE 90 03/30/2023 0914   GLUCOSE 97 08/26/2012 2035   BUN 12 03/30/2023 0914   BUN 13 12/21/2017 1017   BUN 13 08/26/2012 2035   CREATININE 0.53 (L) 03/30/2023 0914   CALCIUM 9.7 03/30/2023 0914   CALCIUM 9.9 08/26/2012 2035   GFRNONAA >60 07/18/2022 0928   GFRNONAA 66 09/11/2020 1147   GFRAA 76 09/11/2020 1147    Lipid Panel     Component Value Date/Time   CHOL 128 03/30/2023 0914   CHOL 181 12/21/2017 1017   TRIG 141 03/30/2023 0914   HDL 49 (L) 03/30/2023 0914   HDL 50 12/21/2017 1017   CHOLHDL 2.6 03/30/2023 0914   LDLCALC 57 03/30/2023 0914    CBC    Component Value Date/Time   WBC 6.9 03/30/2023 0914   RBC 3.87 03/30/2023 0914   HGB 13.4 03/30/2023 0914   HGB 14.8 12/21/2017 1017   HCT 40.1 03/30/2023 0914   HCT 42.5 12/21/2017 1017   PLT 113 (L) 03/30/2023 0914   PLT 141 (L) 12/21/2017 1017   MCV 103.6 (H) 03/30/2023 0914   MCV 93 12/21/2017 1017   MCV 92 08/26/2012 2035   MCH 34.6 (H) 03/30/2023 0914   MCHC 33.4 03/30/2023 0914   RDW 12.5 03/30/2023 0914   RDW 12.8 12/21/2017 1017   RDW 13.1 08/26/2012 2035   LYMPHSABS 2.8 02/11/2022 1307   LYMPHSABS 2.5 12/21/2017 1017   MONOABS 0.8 02/11/2022  1307   EOSABS 0.1 02/11/2022 1307   EOSABS 0.3 12/21/2017 1017   BASOSABS 0.1 02/11/2022 1307   BASOSABS 0.0 12/21/2017 1017    Hgb A1C Lab Results  Component Value Date   HGBA1C 5.3 03/30/2023           Assessment & Plan:   Right side low back pain:  Encouraged heat Advised her  to increase Tylenol to 3 times daily Rx for Lidoderm patches on for 12 hours, off for 12 hours She does not want referral to neurosurgery at this time  RTC in 5 months for follow-up of chronic conditions Nicki Reaper, NP

## 2023-05-21 DIAGNOSIS — E041 Nontoxic single thyroid nodule: Secondary | ICD-10-CM | POA: Diagnosis not present

## 2023-05-21 DIAGNOSIS — E063 Autoimmune thyroiditis: Secondary | ICD-10-CM | POA: Diagnosis not present

## 2023-05-26 ENCOUNTER — Other Ambulatory Visit: Payer: Self-pay

## 2023-05-26 ENCOUNTER — Other Ambulatory Visit: Payer: PPO

## 2023-05-26 ENCOUNTER — Other Ambulatory Visit: Payer: Self-pay | Admitting: Internal Medicine

## 2023-05-26 NOTE — Patient Outreach (Signed)
Care Management   Visit Note  05/26/2023 Name: [name] MRN: [id number] DOB: [birth year]  Subjective: [name] is a 82 y.o. year old female who is a primary care patient of [name], NP. The Care Management team was consulted for assistance.      Engaged with patient spoke with patient by telephone.   Goals Addressed             This Visit's Progress    RNCM Care Management Expected Outcome:  Monitor, Self-Manage and Reduce Symptoms of: Falls prevention and safety concerns       Current Barriers:  Knowledge Deficits related to being safe and monitoring for changes in condition that cause more falls or safety concerns Care Coordination needs related to follow up with pcp post falls in a patient with a fall x 2 in the last 6 weeks, one at church and one at home Chronic Disease Management support and education needs related to falls prevention and safety concerns Falls x 2 in last 6 weeks. One fall 6 weeks ago, one fall 3 weeks ago  Planned Interventions: Provided written and verbal education re: potential causes of falls and Fall prevention strategies. The patient denies any new falls. The patient states that her husband is with her and he monitors her. She denies any new falls or safety concerns Reviewed medications and discussed potential side effects of medications such as dizziness and frequent urination. The patient is compliant with medications and works with pharm D on a regular basis.  Advised patient of importance of notifying provider of falls. The patient states she did not hurt herself, she just got her feet twisted up. The patient states that she is just getting "old". Education provided on being mindful of her surroundings and changing positions slowly. The patient denies any acute distress. Last fall was about 3 weeks ago. No new falls recently. The patient states she is being safe. No acute findings today.  Assessed for signs and symptoms of  orthostatic hypotension Assessed for falls since last encounter. Per the patients husband the last fall was 3 weeks ago. Denies any new falls recently. Is being safe.  Assessed patients knowledge of fall risk prevention secondary to previously provided education Provided patient information for fall alert systems Advised patient to discuss changes in mobility, changes in mentation, increase confusion, increased risk for injury at home with provider Screening for signs and symptoms of depression related to chronic disease state Assessed social determinant of health barriers  Symptom Management: Take medications as prescribed   Attend all scheduled provider appointments Call provider office for new concerns or questions  call the Suicide and Crisis Lifeline: 988 call the [place] National Suicide Prevention Lifeline: 260-079-7562 or TTY: 7147598528 TTY (765) 485-9436) to talk to a trained counselor call 1-800-273-TALK (toll free, 24 hour hotline) if experiencing a Mental Health or Behavioral Health Crisis   Follow Up Plan: Telephone follow up appointment with care management team member scheduled for: 08-04-2023 at 0945 am       RNCM Care Management Expected Outcome:  Monitor, Self-Manage, and Reduce Symptoms of Hypertension       Current Barriers:  Knowledge Deficits related to the need to check her blood pressures on a regular basis to effectively manage HTN in the home setting Care Coordination needs related to ongoing support and education on monitoring changes in HTN or hearth health in a patient with HTN Chronic Disease Management support and education needs related to effective management of  HTN Cognitive Deficits BP Readings from Last 3 Encounters:  05/07/23 112/64  04/15/23 114/74  03/30/23 124/62     Planned Interventions: Evaluation of current treatment plan related to hypertension self management and patient's adherence to plan as established by provider. The patient saw the  pcp on 05-07-2023. Denies any acute changes in HTN or heart health. States she feels like she is doing well. Knows to call the provider for changes or new needs. ;   Provided education to patient re: stroke prevention, s/s of heart attack and stroke. Education provided; Reviewed prescribed diet the patient reminded to follow a heart healthy diet for effective management of her HTN and HLD. The patient states she is eating well, sometimes too much. The patient states that she is monitoring her sodium content and mindful of foods high in sodium and fats.  Reviewed medications with patient and discussed importance of compliance. The patient verbalized compliance with medications. Works with the pharm D on a regular basis for ongoing support and education.  Discussed plans with patient for ongoing care management follow up and provided patient with direct contact information for care management team; Advised patient, providing education and rationale, to monitor blood pressure daily and record, calling PCP for findings outside established parameters. The patient continues to not take her blood pressures at home. She states she is fine and knows when her blood pressure is up because she has a headache. Review of the benefits of checking blood pressures on a regular basis. ;  Reviewed scheduled/upcoming provider appointments including:  Saw the pcp on 05-07-2023. Knows to call for changes or needs.  Advised patient to discuss changes in her HTN or heart health with provider; Provided education on prescribed diet heart healthy;  Discussed complications of poorly controlled blood pressure such as heart disease, stroke, circulatory complications, vision complications, kidney impairment, sexual dysfunction;  Screening for signs and symptoms of depression related to chronic disease state;  Assessed social determinant of health barriers;  Discussed ways to help her back pain and discomfort. She has not used heat but  discussed trying heat application to her back when she is having pain. Still having pain and discomfort in her back. Will discuss with the pcp coming up.   Symptom Management: Take medications as prescribed   Attend all scheduled provider appointments Call provider office for new concerns or questions  call the Suicide and Crisis Lifeline: 988 call the [place] National Suicide Prevention Lifeline: 337-569-5333 or TTY: (579)487-4693 TTY 469-617-5293) to talk to a trained counselor call 1-800-273-TALK (toll free, 24 hour hotline) if experiencing a Mental Health or Behavioral Health Crisis  check blood pressure 3 times per week call doctor for signs and symptoms of high blood pressure develop an action plan for high blood pressure keep all doctor appointments take medications for blood pressure exactly as prescribed report new symptoms to your doctor  Follow Up Plan: Telephone follow up appointment with care management team member scheduled for: 08-04-2023 at 0945 am       RNCM Care Management:  Maintain, Monitor and Self-Manage Symptoms of COPD       Current Barriers:  Care Coordination needs related to ongoing support and education  in a patient with COPD Chronic Disease Management support and education needs related to the effective management of COPD Cognitive Deficits  Planned Interventions: Provided patient with basic written and verbal COPD education on self care/management/and exacerbation prevention. The patient states she is doing well. Denies any new changes in her  breathing. She continues to smoke. Is not interested in smoking cessation. The patient denies any acute findings today related to her breathing. She states that she is doing well. Saw the pcp next week. Will continue to monitor for changes.  Advised patient to track and manage COPD triggers. Review of triggers that can cause exacerbation. The patient stays in when it is cold outside. She knows to be mindful of others who  may be sick.She denies any issues with her breathing. States her biggest issue right now is her pain in her back, she denies any acute distress today with back pain. Feels that it is her arthritis and she will discuss with the pcp coming up. Will continue to monitor for changes.  Provided written and verbal instructions on pursed lip breathing and utilized returned demonstration as teach back Provided instruction about proper use of medications used for management of COPD including inhalers Advised patient to self assesses COPD action plan zone and make appointment with provider if in the yellow zone for 48 hours without improvement Advised patient to engage in light exercise as tolerated 3-5 days a week to aid in the the management of COPD Provided education about and advised patient to utilize infection prevention strategies to reduce risk of respiratory infection. Review of being safe and monitoring for others who may have upper respiratory infections and to be mindful of the risk of infection and protecting self. The patient denies any acute changes in her COPD.  Discussed the importance of adequate rest and management of fatigue with COPD Screening for signs and symptoms of depression related to chronic disease state  Assessed social determinant of health barriers  Symptom Management: Take medications as prescribed   Attend all scheduled provider appointments Call provider office for new concerns or questions  call the Suicide and Crisis Lifeline: 988 call the [place] National Suicide Prevention Lifeline: 763-153-1745 or TTY: 740-513-7101 TTY 312-750-7445) to talk to a trained counselor call 1-800-273-TALK (toll free, 24 hour hotline) if experiencing a Mental Health or Behavioral Health Crisis  avoid second hand smoke eliminate smoking in my home identify and remove indoor air pollutants limit outdoor activity during cold weather listen for public air quality announcements every day do  breathing exercises every day begin a symptom diary develop a rescue plan eliminate symptom triggers at home follow rescue plan if symptoms flare-up  Follow Up Plan: Telephone follow up appointment with care management team member scheduled for: 08-04-2023 at 0945 am           Consent to Services:  Patient was given information about care management services, agreed to services, and gave verbal consent to participate.   Plan: Telephone follow up appointment with care management team member scheduled for: 08-04-2023 at 0945 am  Alto Denver RN, MSN, CCM RN Care Manager  [facility]  Ambulatory Care Management  Direct Number: [phone]

## 2023-05-26 NOTE — Patient Instructions (Signed)
Visit Information  Thank you for taking time to visit with me today. Please don't hesitate to contact me if I can be of assistance to you before our next scheduled telephone appointment.  Following are the goals we discussed today:   Goals Addressed             This Visit's Progress    RNCM Care Management Expected Outcome:  Monitor, Self-Manage and Reduce Symptoms of: Falls prevention and safety concerns       Current Barriers:  Knowledge Deficits related to being safe and monitoring for changes in condition that cause more falls or safety concerns Care Coordination needs related to follow up with pcp post falls in a patient with a fall x 2 in the last 6 weeks, one at church and one at home Chronic Disease Management support and education needs related to falls prevention and safety concerns Falls x 2 in last 6 weeks. One fall 6 weeks ago, one fall 3 weeks ago  Planned Interventions: Provided written and verbal education re: potential causes of falls and Fall prevention strategies. The patient denies any new falls. The patient states that her husband is with her and he monitors her. She denies any new falls or safety concerns Reviewed medications and discussed potential side effects of medications such as dizziness and frequent urination. The patient is compliant with medications and works with pharm D on a regular basis.  Advised patient of importance of notifying provider of falls. The patient states she did not hurt herself, she just got her feet twisted up. The patient states that she is just getting "old". Education provided on being mindful of her surroundings and changing positions slowly. The patient denies any acute distress. Last fall was about 3 weeks ago. No new falls recently. The patient states she is being safe. No acute findings today.  Assessed for signs and symptoms of orthostatic hypotension Assessed for falls since last encounter. Per the patients husband the last fall  was 3 weeks ago. Denies any new falls recently. Is being safe.  Assessed patients knowledge of fall risk prevention secondary to previously provided education Provided patient information for fall alert systems Advised patient to discuss changes in mobility, changes in mentation, increase confusion, increased risk for injury at home with provider Screening for signs and symptoms of depression related to chronic disease state Assessed social determinant of health barriers  Symptom Management: Take medications as prescribed   Attend all scheduled provider appointments Call provider office for new concerns or questions  call the Suicide and Crisis Lifeline: 988 call the [place] National Suicide Prevention Lifeline: (863)688-5469 or TTY: 712-761-8501 TTY 3642518267) to talk to a trained counselor call 1-800-273-TALK (toll free, 24 hour hotline) if experiencing a Mental Health or Behavioral Health Crisis   Follow Up Plan: Telephone follow up appointment with care management team member scheduled for: 08-04-2023 at 0945 am       RNCM Care Management Expected Outcome:  Monitor, Self-Manage, and Reduce Symptoms of Hypertension       Current Barriers:  Knowledge Deficits related to the need to check her blood pressures on a regular basis to effectively manage HTN in the home setting Care Coordination needs related to ongoing support and education on monitoring changes in HTN or hearth health in a patient with HTN Chronic Disease Management support and education needs related to effective management of HTN Cognitive Deficits BP Readings from Last 3 Encounters:  05/07/23 112/64  04/15/23 114/74  03/30/23 124/62  Planned Interventions: Evaluation of current treatment plan related to hypertension self management and patient's adherence to plan as established by provider. The patient saw the pcp on 05-07-2023. Denies any acute changes in HTN or heart health. States she feels like she is doing  well. Knows to call the provider for changes or new needs. ;   Provided education to patient re: stroke prevention, s/s of heart attack and stroke. Education provided; Reviewed prescribed diet the patient reminded to follow a heart healthy diet for effective management of her HTN and HLD. The patient states she is eating well, sometimes too much. The patient states that she is monitoring her sodium content and mindful of foods high in sodium and fats.  Reviewed medications with patient and discussed importance of compliance. The patient verbalized compliance with medications. Works with the pharm D on a regular basis for ongoing support and education.  Discussed plans with patient for ongoing care management follow up and provided patient with direct contact information for care management team; Advised patient, providing education and rationale, to monitor blood pressure daily and record, calling PCP for findings outside established parameters. The patient continues to not take her blood pressures at home. She states she is fine and knows when her blood pressure is up because she has a headache. Review of the benefits of checking blood pressures on a regular basis. ;  Reviewed scheduled/upcoming provider appointments including:  Saw the pcp on 05-07-2023. Knows to call for changes or needs.  Advised patient to discuss changes in her HTN or heart health with provider; Provided education on prescribed diet heart healthy;  Discussed complications of poorly controlled blood pressure such as heart disease, stroke, circulatory complications, vision complications, kidney impairment, sexual dysfunction;  Screening for signs and symptoms of depression related to chronic disease state;  Assessed social determinant of health barriers;  Discussed ways to help her back pain and discomfort. She has not used heat but discussed trying heat application to her back when she is having pain. Still having pain and discomfort  in her back. Will discuss with the pcp coming up.   Symptom Management: Take medications as prescribed   Attend all scheduled provider appointments Call provider office for new concerns or questions  call the Suicide and Crisis Lifeline: 988 call the [place] National Suicide Prevention Lifeline: 478 791 9912 or TTY: (903) 617-9531 TTY (778)245-0153) to talk to a trained counselor call 1-800-273-TALK (toll free, 24 hour hotline) if experiencing a Mental Health or Behavioral Health Crisis  check blood pressure 3 times per week call doctor for signs and symptoms of high blood pressure develop an action plan for high blood pressure keep all doctor appointments take medications for blood pressure exactly as prescribed report new symptoms to your doctor  Follow Up Plan: Telephone follow up appointment with care management team member scheduled for: 08-04-2023 at 0945 am       RNCM Care Management:  Maintain, Monitor and Self-Manage Symptoms of COPD       Current Barriers:  Care Coordination needs related to ongoing support and education  in a patient with COPD Chronic Disease Management support and education needs related to the effective management of COPD Cognitive Deficits  Planned Interventions: Provided patient with basic written and verbal COPD education on self care/management/and exacerbation prevention. The patient states she is doing well. Denies any new changes in her breathing. She continues to smoke. Is not interested in smoking cessation. The patient denies any acute findings today related to her breathing.  She states that she is doing well. Saw the pcp next week. Will continue to monitor for changes.  Advised patient to track and manage COPD triggers. Review of triggers that can cause exacerbation. The patient stays in when it is cold outside. She knows to be mindful of others who may be sick.She denies any issues with her breathing. States her biggest issue right now is her pain in  her back, she denies any acute distress today with back pain. Feels that it is her arthritis and she will discuss with the pcp coming up. Will continue to monitor for changes.  Provided written and verbal instructions on pursed lip breathing and utilized returned demonstration as teach back Provided instruction about proper use of medications used for management of COPD including inhalers Advised patient to self assesses COPD action plan zone and make appointment with provider if in the yellow zone for 48 hours without improvement Advised patient to engage in light exercise as tolerated 3-5 days a week to aid in the the management of COPD Provided education about and advised patient to utilize infection prevention strategies to reduce risk of respiratory infection. Review of being safe and monitoring for others who may have upper respiratory infections and to be mindful of the risk of infection and protecting self. The patient denies any acute changes in her COPD.  Discussed the importance of adequate rest and management of fatigue with COPD Screening for signs and symptoms of depression related to chronic disease state  Assessed social determinant of health barriers  Symptom Management: Take medications as prescribed   Attend all scheduled provider appointments Call provider office for new concerns or questions  call the Suicide and Crisis Lifeline: 988 call the [place] National Suicide Prevention Lifeline: 5396708614 or TTY: (856)872-7846 TTY 207-566-9767) to talk to a trained counselor call 1-800-273-TALK (toll free, 24 hour hotline) if experiencing a Mental Health or Behavioral Health Crisis  avoid second hand smoke eliminate smoking in my home identify and remove indoor air pollutants limit outdoor activity during cold weather listen for public air quality announcements every day do breathing exercises every day begin a symptom diary develop a rescue plan eliminate symptom triggers at  home follow rescue plan if symptoms flare-up  Follow Up Plan: Telephone follow up appointment with care management team member scheduled for: 08-04-2023 at 0945 am           Our next appointment is by telephone on 08-04-2023 at 0945 am  Please call the care guide team at [phone] if you need to cancel or reschedule your appointment.   If you are experiencing a Mental Health or Behavioral Health Crisis or need someone to talk to, please call the Suicide and Crisis Lifeline: 988 call the [place] National Suicide Prevention Lifeline: (626)808-6479 or TTY: (305)407-8741 TTY 719 817 1716) to talk to a trained counselor call 1-800-273-TALK (toll free, 24 hour hotline)   Patient verbalizes understanding of instructions and care plan provided today and agrees to view in MyChart. Active MyChart status and patient understanding of how to access instructions and care plan via MyChart confirmed with patient.     Telephone follow up appointment with care management team member scheduled for: 08-04-2023 at 0945 am  Alto Denver RN, MSN, CCM RN Care Manager  [facility]  Ambulatory Care Management  Direct Number: [phone]

## 2023-05-28 NOTE — Telephone Encounter (Signed)
Unable to refill per protocol, Rx expired. Discontinued 10/17/22, dose change.  Requested Prescriptions  Pending Prescriptions Disp Refills   atorvastatin (LIPITOR) 20 MG tablet [Pharmacy Med Name: ATORVASTATIN 20MG  TABLETS] 90 tablet 1    Sig: TAKE 1 TABLET(20 MG) BY MOUTH DAILY     Cardiovascular:  Antilipid - Statins Failed - 05/26/2023  6:21 PM      Failed - Lipid Panel in normal range within the last 12 months    Cholesterol, Total  Date Value Ref Range Status  12/21/2017 181 100 - 199 mg/dL Final   Cholesterol  Date Value Ref Range Status  03/30/2023 128 <200 mg/dL Final   LDL Cholesterol (Calc)  Date Value Ref Range Status  03/30/2023 57 mg/dL (calc) Final    Comment:    Reference range: <100 . Desirable range <100 mg/dL for primary prevention;   <70 mg/dL for patients with CHD or diabetic patients  with > or = 2 CHD risk factors. Marland Kitchen LDL-C is now calculated using the Martin-Hopkins  calculation, which is a validated novel method providing  better accuracy than the Friedewald equation in the  estimation of LDL-C.  Horald Pollen et al. Lenox Ahr. 4098;119(14): 2061-2068  (http://education.[facility].com/faq/FAQ164)    HDL  Date Value Ref Range Status  03/30/2023 49 (L) > OR = 50 mg/dL Final  78/29/5621 50 >30 mg/dL Final   Triglycerides  Date Value Ref Range Status  03/30/2023 141 <150 mg/dL Final         Passed - Patient is not pregnant      Passed - Valid encounter within last 12 months    Recent Outpatient Visits           3 weeks ago Acquired hypothyroidism   [place] [facility] [place], Salvadore Oxford, NP   1 month ago Right sided sciatica   [place] [facility] [place], Salvadore Oxford, NP   3 months ago Acquired hypothyroidism   [place] [facility] [place], Salvadore Oxford, NP   5 months ago Other eczema   [facility] [place], Salvadore Oxford, NP   7 months ago Acquired hypothyroidism   [place]  [facility] [place], Salvadore Oxford, NP       Future Appointments             In 4 months Baity, Salvadore Oxford, NP [place] [facility], [facility]

## 2023-06-03 ENCOUNTER — Telehealth: Payer: PPO | Admitting: Orthopedic Surgery

## 2023-06-11 DIAGNOSIS — M5136 Other intervertebral disc degeneration, lumbar region: Secondary | ICD-10-CM | POA: Diagnosis not present

## 2023-06-11 DIAGNOSIS — M5416 Radiculopathy, lumbar region: Secondary | ICD-10-CM | POA: Diagnosis not present

## 2023-06-11 DIAGNOSIS — M7918 Myalgia, other site: Secondary | ICD-10-CM | POA: Diagnosis not present

## 2023-07-22 DIAGNOSIS — E041 Nontoxic single thyroid nodule: Secondary | ICD-10-CM | POA: Diagnosis not present

## 2023-07-22 DIAGNOSIS — E063 Autoimmune thyroiditis: Secondary | ICD-10-CM | POA: Diagnosis not present

## 2023-07-31 ENCOUNTER — Other Ambulatory Visit: Payer: Self-pay | Admitting: Internal Medicine

## 2023-07-31 NOTE — Telephone Encounter (Signed)
Requested Prescriptions  Pending Prescriptions Disp Refills   atorvastatin (LIPITOR) 40 MG tablet [Pharmacy Med Name: ATORVASTATIN 40MG  TABLETS] 90 tablet 2    Sig: TAKE 1 TABLET(40 MG) BY MOUTH DAILY     Cardiovascular:  Antilipid - Statins Failed - 07/31/2023  8:59 AM      Failed - Lipid Panel in normal range within the last 12 months    Cholesterol, Total  Date Value Ref Range Status  12/21/2017 181 100 - 199 mg/dL Final   Cholesterol  Date Value Ref Range Status  03/30/2023 128 <200 mg/dL Final   LDL Cholesterol (Calc)  Date Value Ref Range Status  03/30/2023 57 mg/dL (calc) Final    Comment:    Reference range: <100 . Desirable range <100 mg/dL for primary prevention;   <70 mg/dL for patients with CHD or diabetic patients  with > or = 2 CHD risk factors. Marland Kitchen LDL-C is now calculated using the Martin-Hopkins  calculation, which is a validated novel method providing  better accuracy than the Friedewald equation in the  estimation of LDL-C.  Horald Pollen et al. Lenox Ahr. 4010;272(53): 2061-2068  (http://education.[facility].com/faq/FAQ164)    HDL  Date Value Ref Range Status  03/30/2023 49 (L) > OR = 50 mg/dL Final  66/44/0347 50 >42 mg/dL Final   Triglycerides  Date Value Ref Range Status  03/30/2023 141 <150 mg/dL Final         Passed - Patient is not pregnant      Passed - Valid encounter within last 12 months    Recent Outpatient Visits           2 months ago Acquired hypothyroidism   [place] [facility] [place], Salvadore Oxford, NP   4 months ago Right sided sciatica   [place] [facility] [place], Salvadore Oxford, NP   5 months ago Acquired hypothyroidism   [place] [facility] [place], Salvadore Oxford, NP   8 months ago Other eczema   [facility] [place], Salvadore Oxford, NP   9 months ago Acquired hypothyroidism   [place] [facility] [place], Salvadore Oxford, NP       Future Appointments              In 2 months Baity, Salvadore Oxford, NP [place] [facility], [facility]

## 2023-08-04 ENCOUNTER — Other Ambulatory Visit: Payer: Self-pay

## 2023-08-04 ENCOUNTER — Telehealth: Payer: Self-pay

## 2023-08-04 NOTE — Patient Outreach (Signed)
  Care Management   Follow Up Note   08/04/2023 Name: [name] MRN: [id number] DOB: 06-08-1941   Referred by: [name], NP Reason for referral : Care Management (RNCM: Follow up for Chronic Disease Management and Care Coordination Needs- attempt)   An unsuccessful telephone outreach was attempted today. The patient was referred to the case management team for assistance with care management and care coordination.   Follow Up Plan: A HIPPA compliant phone message was left for the patient providing contact information and requesting a return call.   Alto Denver RN, MSN, CCM RN Care Manager  [facility]  Ambulatory Care Management  Direct Number: [phone]

## 2023-08-17 ENCOUNTER — Telehealth: Payer: PPO

## 2023-08-18 ENCOUNTER — Other Ambulatory Visit: Payer: Self-pay

## 2023-08-18 NOTE — Patient Outreach (Signed)
Care Management   Visit Note  08/18/2023 Name: BRENNA STEERMAN MRN: [id number] DOB: 03-May-1941  Subjective: [name] is a 82 y.o. year old female who is a primary care patient of [name], NP. The Care Management team was consulted for assistance.      Engaged with patient spoke with patient by telephone.    Goals Addressed             This Visit's Progress    COMPLETED: RNCM Care Management Expected Outcome:  Monitor, Self-Manage and Reduce Symptoms of: Falls prevention and safety concerns       Current Barriers: Closing this goal. The patient has not had any new falls. Will reopen in the future if needed Knowledge Deficits related to being safe and monitoring for changes in condition that cause more falls or safety concerns Care Coordination needs related to follow up with pcp post falls in a patient with a fall x 2 in the last 6 weeks, one at church and one at home Chronic Disease Management support and education needs related to falls prevention and safety concerns Falls x 2 in last 6 weeks. One fall 6 weeks ago, one fall 3 weeks ago  Planned Interventions: Provided written and verbal education re: potential causes of falls and Fall prevention strategies. The patient denies any new falls. The patient states that her husband is with her and he monitors her. She denies any new falls or safety concerns Reviewed medications and discussed potential side effects of medications such as dizziness and frequent urination. The patient is compliant with medications and works with pharm D on a regular basis.  Advised patient of importance of notifying provider of falls. The patient states she did not hurt herself, she just got her feet twisted up. The patient states that she is just getting "old". Education provided on being mindful of her surroundings and changing positions slowly. The patient denies any acute distress. Last fall was about 3 weeks ago. No new falls recently.  The patient states she is being safe. No acute findings today.  Assessed for signs and symptoms of orthostatic hypotension Assessed for falls since last encounter. Per the patients husband the last fall was 3 weeks ago. Denies any new falls recently. Is being safe.  Assessed patients knowledge of fall risk prevention secondary to previously provided education Provided patient information for fall alert systems Advised patient to discuss changes in mobility, changes in mentation, increase confusion, increased risk for injury at home with provider Screening for signs and symptoms of depression related to chronic disease state Assessed social determinant of health barriers  Symptom Management: Take medications as prescribed   Attend all scheduled provider appointments Call provider office for new concerns or questions  call the Suicide and Crisis Lifeline: 988 call the [place] National Suicide Prevention Lifeline: 4012338534 or TTY: 940-544-9270 TTY (623) 308-5130) to talk to a trained counselor call 1-800-273-TALK (toll free, 24 hour hotline) if experiencing a Mental Health or Behavioral Health Crisis   Follow Up Plan: Telephone follow up appointment with care management team member scheduled for: 08-04-2023 at 0945 am       RNCM Care Management Expected Outcome:  Monitor, Self-Manage, and Reduce Symptoms of Hypertension       Current Barriers:  Knowledge Deficits related to the need to check her blood pressures on a regular basis to effectively manage HTN in the home setting Care Coordination needs related to ongoing support and education on monitoring changes in HTN or  hearth health in a patient with HTN Chronic Disease Management support and education needs related to effective management of HTN Cognitive Deficits BP Readings from Last 3 Encounters:  05/07/23 112/64  04/15/23 114/74  03/30/23 124/62     Planned Interventions: Evaluation of current treatment plan related to  hypertension self management and patient's adherence to plan as established by provider.  Denies any acute changes in HTN or heart health. States she feels like she is doing well. Knows to call the provider for changes or new needs. Was having an upset stomach today, but had taken medications and is feeling better. Review of the changes the provider had made with her Thyroid medications. The patient has still be taking it 7 days a week. Advised the patient not to take her synthroid on Sundays. The patient verbalized understanding;   Provided education to patient re: stroke prevention, s/s of heart attack and stroke. Education provided; Reviewed prescribed diet the patient reminded to follow a heart healthy diet for effective management of her HTN and HLD. The patient states she is eating well, sometimes too much. The patient states that she is monitoring her sodium content and mindful of foods high in sodium and fats.  Reviewed medications with patient and discussed importance of compliance. The patient verbalized compliance with medications. Works with the pharm D on a regular basis for ongoing support and education.  Discussed plans with patient for ongoing care management follow up and provided patient with direct contact information for care management team; Advised patient, providing education and rationale, to monitor blood pressure daily and record, calling PCP for findings outside established parameters. The patient continues to not take her blood pressures at home. She states she is fine and knows when her blood pressure is up because she has a headache. Review of the benefits of checking blood pressures on a regular basis. ;  Reviewed scheduled/upcoming provider appointments including:  Next appointment with the pcp on 09-30-2023 at 0900 am. Reminder given today.  Advised patient to discuss changes in her HTN or heart health with provider; Provided education on prescribed diet heart healthy;   Discussed complications of poorly controlled blood pressure such as heart disease, stroke, circulatory complications, vision complications, kidney impairment, sexual dysfunction;  Screening for signs and symptoms of depression related to chronic disease state;  Assessed social determinant of health barriers;  Discussed ways to help her back pain and discomfort. She has not used heat but discussed trying heat application to her back when she is having pain. Still having pain and discomfort in her back.   Symptom Management: Take medications as prescribed   Attend all scheduled provider appointments Call provider office for new concerns or questions  call the Suicide and Crisis Lifeline: 988 call the [place] National Suicide Prevention Lifeline: 8734232170 or TTY: 579-385-0440 TTY 380 089 9078) to talk to a trained counselor call 1-800-273-TALK (toll free, 24 hour hotline) if experiencing a Mental Health or Behavioral Health Crisis  check blood pressure 3 times per week call doctor for signs and symptoms of high blood pressure develop an action plan for high blood pressure keep all doctor appointments take medications for blood pressure exactly as prescribed report new symptoms to your doctor  Follow Up Plan: Telephone follow up appointment with care management team member scheduled for: 10-21-2023 at 1030 am       RNCM Care Management:  Maintain, Monitor and Self-Manage Symptoms of COPD       Current Barriers:  Care Coordination needs related  to ongoing support and education  in a patient with COPD Chronic Disease Management support and education needs related to the effective management of COPD Cognitive Deficits  Planned Interventions: Provided patient with basic written and verbal COPD education on self care/management/and exacerbation prevention. The patient states she is doing well. Denies any new changes in her breathing. She continues to smoke. Is not interested in smoking  cessation. The patient denies any acute findings today related to her breathing. She states that she is doing well. Saw the pcp next week. Will continue to monitor for changes.  Advised patient to track and manage COPD triggers. Review of triggers that can cause exacerbation. The patient stays in when it is cold outside. She knows to be mindful of others who may be sick.She denies any issues with her breathing. States her biggest issue right now is her pain in her back, she denies any acute distress today with back pain. Feels that it is her arthritis and she will discuss with the pcp coming up. Will continue to monitor for changes.  Provided written and verbal instructions on pursed lip breathing and utilized returned demonstration as teach back Provided instruction about proper use of medications used for management of COPD including inhalers Advised patient to self assesses COPD action plan zone and make appointment with provider if in the yellow zone for 48 hours without improvement Advised patient to engage in light exercise as tolerated 3-5 days a week to aid in the the management of COPD Provided education about and advised patient to utilize infection prevention strategies to reduce risk of respiratory infection. Review of being safe and monitoring for others who may have upper respiratory infections and to be mindful of the risk of infection and protecting self. The patient denies any acute changes in her COPD.  Discussed the importance of adequate rest and management of fatigue with COPD Screening for signs and symptoms of depression related to chronic disease state  Assessed social determinant of health barriers  Symptom Management: Take medications as prescribed   Attend all scheduled provider appointments Call provider office for new concerns or questions  call the Suicide and Crisis Lifeline: 988 call the [place] National Suicide Prevention Lifeline: (662)517-8934 or TTY: 2155262024 TTY  2602410989) to talk to a trained counselor call 1-800-273-TALK (toll free, 24 hour hotline) if experiencing a Mental Health or Behavioral Health Crisis  avoid second hand smoke eliminate smoking in my home identify and remove indoor air pollutants limit outdoor activity during cold weather listen for public air quality announcements every day do breathing exercises every day begin a symptom diary develop a rescue plan eliminate symptom triggers at home follow rescue plan if symptoms flare-up  Follow Up Plan: Telephone follow up appointment with care management team member scheduled for: 10-21-2023 at 1030 am           Consent to Services:  Patient was given information about care management services, agreed to services, and gave verbal consent to participate.   Plan: Telephone follow up appointment with care management team member scheduled for: 10-21-2023 at 1030 am  Alto Denver RN, MSN, CCM RN Care Manager  [facility]  Ambulatory Care Management  Direct Number: [phone]

## 2023-08-18 NOTE — Patient Instructions (Signed)
Visit Information  Thank you for taking time to visit with me today. Please don't hesitate to contact me if I can be of assistance to you before our next scheduled telephone appointment.  Following are the goals we discussed today:   Goals Addressed             This Visit's Progress    COMPLETED: RNCM Care Management Expected Outcome:  Monitor, Self-Manage and Reduce Symptoms of: Falls prevention and safety concerns       Current Barriers: Closing this goal. The patient has not had any new falls. Will reopen in the future if needed Knowledge Deficits related to being safe and monitoring for changes in condition that cause more falls or safety concerns Care Coordination needs related to follow up with pcp post falls in a patient with a fall x 2 in the last 6 weeks, one at church and one at home Chronic Disease Management support and education needs related to falls prevention and safety concerns Falls x 2 in last 6 weeks. One fall 6 weeks ago, one fall 3 weeks ago  Planned Interventions: Provided written and verbal education re: potential causes of falls and Fall prevention strategies. The patient denies any new falls. The patient states that her husband is with her and he monitors her. She denies any new falls or safety concerns Reviewed medications and discussed potential side effects of medications such as dizziness and frequent urination. The patient is compliant with medications and works with pharm D on a regular basis.  Advised patient of importance of notifying provider of falls. The patient states she did not hurt herself, she just got her feet twisted up. The patient states that she is just getting "old". Education provided on being mindful of her surroundings and changing positions slowly. The patient denies any acute distress. Last fall was about 3 weeks ago. No new falls recently. The patient states she is being safe. No acute findings today.  Assessed for signs and symptoms of  orthostatic hypotension Assessed for falls since last encounter. Per the patients husband the last fall was 3 weeks ago. Denies any new falls recently. Is being safe.  Assessed patients knowledge of fall risk prevention secondary to previously provided education Provided patient information for fall alert systems Advised patient to discuss changes in mobility, changes in mentation, increase confusion, increased risk for injury at home with provider Screening for signs and symptoms of depression related to chronic disease state Assessed social determinant of health barriers  Symptom Management: Take medications as prescribed   Attend all scheduled provider appointments Call provider office for new concerns or questions  call the Suicide and Crisis Lifeline: 988 call the [place] National Suicide Prevention Lifeline: (309) 010-2612 or TTY: (661) 736-1914 TTY 878-250-6311) to talk to a trained counselor call 1-800-273-TALK (toll free, 24 hour hotline) if experiencing a Mental Health or Behavioral Health Crisis   Follow Up Plan: Telephone follow up appointment with care management team member scheduled for: 08-04-2023 at 0945 am       RNCM Care Management Expected Outcome:  Monitor, Self-Manage, and Reduce Symptoms of Hypertension       Current Barriers:  Knowledge Deficits related to the need to check her blood pressures on a regular basis to effectively manage HTN in the home setting Care Coordination needs related to ongoing support and education on monitoring changes in HTN or hearth health in a patient with HTN Chronic Disease Management support and education needs related to effective management of HTN  Cognitive Deficits BP Readings from Last 3 Encounters:  05/07/23 112/64  04/15/23 114/74  03/30/23 124/62     Planned Interventions: Evaluation of current treatment plan related to hypertension self management and patient's adherence to plan as established by provider.  Denies any acute  changes in HTN or heart health. States she feels like she is doing well. Knows to call the provider for changes or new needs. Was having an upset stomach today, but had taken medications and is feeling better. Review of the changes the provider had made with her Thyroid medications. The patient has still be taking it 7 days a week. Advised the patient not to take her synthroid on Sundays. The patient verbalized understanding;   Provided education to patient re: stroke prevention, s/s of heart attack and stroke. Education provided; Reviewed prescribed diet the patient reminded to follow a heart healthy diet for effective management of her HTN and HLD. The patient states she is eating well, sometimes too much. The patient states that she is monitoring her sodium content and mindful of foods high in sodium and fats.  Reviewed medications with patient and discussed importance of compliance. The patient verbalized compliance with medications. Works with the pharm D on a regular basis for ongoing support and education.  Discussed plans with patient for ongoing care management follow up and provided patient with direct contact information for care management team; Advised patient, providing education and rationale, to monitor blood pressure daily and record, calling PCP for findings outside established parameters. The patient continues to not take her blood pressures at home. She states she is fine and knows when her blood pressure is up because she has a headache. Review of the benefits of checking blood pressures on a regular basis. ;  Reviewed scheduled/upcoming provider appointments including:  Next appointment with the pcp on 09-30-2023 at 0900 am. Reminder given today.  Advised patient to discuss changes in her HTN or heart health with provider; Provided education on prescribed diet heart healthy;  Discussed complications of poorly controlled blood pressure such as heart disease, stroke, circulatory  complications, vision complications, kidney impairment, sexual dysfunction;  Screening for signs and symptoms of depression related to chronic disease state;  Assessed social determinant of health barriers;  Discussed ways to help her back pain and discomfort. She has not used heat but discussed trying heat application to her back when she is having pain. Still having pain and discomfort in her back.   Symptom Management: Take medications as prescribed   Attend all scheduled provider appointments Call provider office for new concerns or questions  call the Suicide and Crisis Lifeline: 988 call the [place] National Suicide Prevention Lifeline: 8602389489 or TTY: (601)487-6387 TTY 9138368581) to talk to a trained counselor call 1-800-273-TALK (toll free, 24 hour hotline) if experiencing a Mental Health or Behavioral Health Crisis  check blood pressure 3 times per week call doctor for signs and symptoms of high blood pressure develop an action plan for high blood pressure keep all doctor appointments take medications for blood pressure exactly as prescribed report new symptoms to your doctor  Follow Up Plan: Telephone follow up appointment with care management team member scheduled for: 10-21-2023 at 1030 am       RNCM Care Management:  Maintain, Monitor and Self-Manage Symptoms of COPD       Current Barriers:  Care Coordination needs related to ongoing support and education  in a patient with COPD Chronic Disease Management support and education needs related to  the effective management of COPD Cognitive Deficits  Planned Interventions: Provided patient with basic written and verbal COPD education on self care/management/and exacerbation prevention. The patient states she is doing well. Denies any new changes in her breathing. She continues to smoke. Is not interested in smoking cessation. The patient denies any acute findings today related to her breathing. She states that she is doing  well. Saw the pcp next week. Will continue to monitor for changes.  Advised patient to track and manage COPD triggers. Review of triggers that can cause exacerbation. The patient stays in when it is cold outside. She knows to be mindful of others who may be sick.She denies any issues with her breathing. States her biggest issue right now is her pain in her back, she denies any acute distress today with back pain. Feels that it is her arthritis and she will discuss with the pcp coming up. Will continue to monitor for changes.  Provided written and verbal instructions on pursed lip breathing and utilized returned demonstration as teach back Provided instruction about proper use of medications used for management of COPD including inhalers Advised patient to self assesses COPD action plan zone and make appointment with provider if in the yellow zone for 48 hours without improvement Advised patient to engage in light exercise as tolerated 3-5 days a week to aid in the the management of COPD Provided education about and advised patient to utilize infection prevention strategies to reduce risk of respiratory infection. Review of being safe and monitoring for others who may have upper respiratory infections and to be mindful of the risk of infection and protecting self. The patient denies any acute changes in her COPD.  Discussed the importance of adequate rest and management of fatigue with COPD Screening for signs and symptoms of depression related to chronic disease state  Assessed social determinant of health barriers  Symptom Management: Take medications as prescribed   Attend all scheduled provider appointments Call provider office for new concerns or questions  call the Suicide and Crisis Lifeline: 988 call the [place] National Suicide Prevention Lifeline: 503-617-6730 or TTY: 7741098950 TTY 225-630-1047) to talk to a trained counselor call 1-800-273-TALK (toll free, 24 hour hotline) if  experiencing a Mental Health or Behavioral Health Crisis  avoid second hand smoke eliminate smoking in my home identify and remove indoor air pollutants limit outdoor activity during cold weather listen for public air quality announcements every day do breathing exercises every day begin a symptom diary develop a rescue plan eliminate symptom triggers at home follow rescue plan if symptoms flare-up  Follow Up Plan: Telephone follow up appointment with care management team member scheduled for: 10-21-2023 at 1030 am           Our next appointment is by telephone on 10-21-2023 at 1030 am  Please call the care guide team at [phone] if you need to cancel or reschedule your appointment.   If you are experiencing a Mental Health or Behavioral Health Crisis or need someone to talk to, please call the Suicide and Crisis Lifeline: 988 call the [place] National Suicide Prevention Lifeline: (802) 463-4130 or TTY: 209-267-2329 TTY (325) 342-5572) to talk to a trained counselor call 1-800-273-TALK (toll free, 24 hour hotline)   Patient verbalizes understanding of instructions and care plan provided today and agrees to view in MyChart. Active MyChart status and patient understanding of how to access instructions and care plan via MyChart confirmed with patient.       Alto Denver RN, MSN,  CCM RN Care Manager  [facility]  Ambulatory Care Management  Direct Number: [phone]

## 2023-09-14 ENCOUNTER — Ambulatory Visit: Payer: PPO | Admitting: Pharmacist

## 2023-09-14 DIAGNOSIS — I10 Essential (primary) hypertension: Secondary | ICD-10-CM

## 2023-09-14 DIAGNOSIS — E039 Hypothyroidism, unspecified: Secondary | ICD-10-CM

## 2023-09-14 NOTE — Progress Notes (Signed)
   09/14/2023 Name: [name] MRN: [id number] DOB: [birth year]  Chief Complaint  Patient presents with   Medication Management    [name] is a 82 y.o. year old female who presented for a telephone visit.   They were referred to the pharmacist by their PCP for assistance in managing medication adherence.    Subjective:  Care Team: Primary Care Provider: [name], NP ; Next Scheduled Visit: 09/30/2023 Endocrinologist Solum, Rhae Hammock, MD; Next Scheduled Visit: 10/22/2023  Nurse Care Manager: Juanell Fairly, RN; Next Schedule Visit: 10/21/2023  Medication Access/Adherence  Current Pharmacy:  [name] - [place], [place] - [address] AT [facility] [address] [place] [address] Phone: [phone] Fax: [phone]   Patient reports affordability concerns with their medications: No  Patient reports access/transportation concerns to their pharmacy: No  Patient reports adherence concerns with their medications:  No     Hypothyroidism:  Current treatment: levothyroxine 150 mcg QAM Monday-Saturday, then SKIP dose on Sundays    Hypertension:   Current treatment: Amlodipine 10 mg daily Metoprolol ER 50 mg QAM Losartan 100 mg daily Denies monitoring home blood pressure Denies hypertensive symptoms as long as she takes positional changes slowly     Objective:   Lab Results  Component Value Date   CREATININE 0.53 (L) 03/30/2023   BUN 12 03/30/2023   NA 144 03/30/2023   K 4.0 03/30/2023   CL 114 (H) 03/30/2023   CO2 25 03/30/2023    Lab Results  Component Value Date   CHOL 128 03/30/2023   HDL 49 (L) 03/30/2023   LDLCALC 57 03/30/2023   TRIG 141 03/30/2023   CHOLHDL 2.6 03/30/2023   BP Readings from Last 3 Encounters:  05/07/23 112/64  04/15/23 114/74  03/30/23 124/62   Pulse Readings from Last 3 Encounters:  05/07/23 (!) 52  03/30/23 (!) 58  02/05/23 76    Medications Reviewed Today    Medications were not reviewed in this encounter       Assessment/Plan:   Hypothyroidism: - Patient to continue to take levothyroxine 150 mcg tablet once a day Monday-Saturday, then SKIP dose on Sundays. Remind patient to take on an empty stomach with a glass of water at least 30-60 minutes before breakfast.  - Patient to follow up with Endocrinologist for appointment on 1/30 and lab recheck on 1/27 as scheduled  Hypertension: - Currently controlled - Have counseled patient on home BP monitoring technique - Have encouraged patient to start monitoring home BP, to keep a log of the results, have this record for [place] to review during telephone appointment, but to call office sooner or readings outside of established parameter or for new/worsening symptoms Patient denies interest in home monitoring at this time    Follow Up Plan:   Patient denies further medication questions or concerns today Provide patient with contact information for clinic pharmacist to contact if needed in future for medication questions/concerns   Estelle Grumbles, PharmD, [facility] Clinical Pharmacist [facility] [phone]

## 2023-09-14 NOTE — Patient Instructions (Signed)
Goals Addressed             This Visit's Progress    Pharmacy Goals       Please obtain new upper arm blood pressure monitor and restart checking your home blood pressure  We recommend a blood pressure cuff that goes around your upper arm, as these are generally the most accurate.   To appropriately check your blood pressure, make sure you do the following:  1) Avoid caffeine, exercise, or tobacco products for 30 minutes before checking. 2) Sit with your back supported in a flat-backed chair. Rest your arm on something flat (arm of the chair, table, etc). 3) Sit still with your feet flat on the floor, resting, for at least 5 minutes.  4) Check your blood pressure. Take 1-2 readings.   Write down these readings and bring with you to any provider appointments. Bring your home blood pressure machine with you to a provider's office for accuracy comparison at least once a year. Make sure you take your blood pressure medications before you come to any office visit.  Feel free to call me with any questions or concerns. I look forward to our next call!  Estelle Grumbles, PharmD, [facility] Clinical Pharmacist [facility] [phone]

## 2023-09-30 ENCOUNTER — Ambulatory Visit (INDEPENDENT_AMBULATORY_CARE_PROVIDER_SITE_OTHER): Payer: PPO | Admitting: Internal Medicine

## 2023-09-30 ENCOUNTER — Encounter: Payer: Self-pay | Admitting: Internal Medicine

## 2023-09-30 VITALS — BP 112/68 | Ht 61.5 in | Wt 143.6 lb

## 2023-09-30 DIAGNOSIS — J4489 Other specified chronic obstructive pulmonary disease: Secondary | ICD-10-CM | POA: Diagnosis not present

## 2023-09-30 DIAGNOSIS — F5104 Psychophysiologic insomnia: Secondary | ICD-10-CM

## 2023-09-30 DIAGNOSIS — R4189 Other symptoms and signs involving cognitive functions and awareness: Secondary | ICD-10-CM | POA: Diagnosis not present

## 2023-09-30 DIAGNOSIS — I10 Essential (primary) hypertension: Secondary | ICD-10-CM | POA: Diagnosis not present

## 2023-09-30 DIAGNOSIS — E213 Hyperparathyroidism, unspecified: Secondary | ICD-10-CM

## 2023-09-30 DIAGNOSIS — F419 Anxiety disorder, unspecified: Secondary | ICD-10-CM | POA: Diagnosis not present

## 2023-09-30 DIAGNOSIS — Z6826 Body mass index (BMI) 26.0-26.9, adult: Secondary | ICD-10-CM

## 2023-09-30 DIAGNOSIS — E663 Overweight: Secondary | ICD-10-CM

## 2023-09-30 DIAGNOSIS — E039 Hypothyroidism, unspecified: Secondary | ICD-10-CM

## 2023-09-30 DIAGNOSIS — E7800 Pure hypercholesterolemia, unspecified: Secondary | ICD-10-CM | POA: Diagnosis not present

## 2023-09-30 DIAGNOSIS — I700 Atherosclerosis of aorta: Secondary | ICD-10-CM | POA: Diagnosis not present

## 2023-09-30 DIAGNOSIS — M19012 Primary osteoarthritis, left shoulder: Secondary | ICD-10-CM

## 2023-09-30 DIAGNOSIS — D696 Thrombocytopenia, unspecified: Secondary | ICD-10-CM

## 2023-09-30 DIAGNOSIS — G2581 Restless legs syndrome: Secondary | ICD-10-CM

## 2023-09-30 DIAGNOSIS — R739 Hyperglycemia, unspecified: Secondary | ICD-10-CM | POA: Diagnosis not present

## 2023-09-30 DIAGNOSIS — M19011 Primary osteoarthritis, right shoulder: Secondary | ICD-10-CM

## 2023-09-30 NOTE — Progress Notes (Signed)
 Subjective:    Patient ID: [name], female    DOB: March 11, 1941, 83 y.o.   MRN: [id number]  HPI  Patient presents to clinic today for follow-up of chronic conditions.  HTN: Her BP today is 112/68.  She is taking amlodipine , losartan  and metoprolol  as prescribed.  ECG from 07/2022 reviewed.  HLD with aortic atheroscleorosis: Her last LDL was 57, triglycerides 858, 03/2023.  She denies myalgias on atorvastatin . She is taking aspirin  as well.  She tries to consume a low-fat diet.  Hypothyroidism: She is taking levothyroxine  as prescribed.  She follows with endocrinology.  Hyperparathyroidism: Her last calcium  was 9.7, 03/2023.  She is status post parathyroidectomy.  She follows with endocrinology.  COPD: She denies chronic cough or shortness of breath.  She is not using any inhalers at this time.  There are no PFTs on file.  She does not smoke.  She does not follow with pulmonology.  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  She takes tylenol  as needed with good relief of symptoms.  Osteopenia: She is taking calcium  and vitamin d  daily.  She tries to get weightbearing exercise and daily.  Bone density from 12/2021 reviewed.  Anxiety: Chronic, managed on citalopram .  She is not currently seeing a therapist.  She denies depression, SI/HI.  Insomnia: She has difficulty falling asleep and staying asleep.  She is taking trazodone  as prescribed with good relief of symptoms.  There is no sleep study on file.  RLS: She is not currently taking any medications for this.  She does not follow with neurology.  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  She is not currently taking any medications for this.  She does not follow with neurology.  Thrombocytopenia: Her last platelet count was 113, 03/2023.  She does not follow with hematology.  Review of Systems     Past Medical History:  Diagnosis Date   Allergy    Anemia    Anxiety    Arthritis    Cancer (HCC)    skin cancer    Hyperlipidemia    Hypertension    Hypothyroidism    Sciatica    Sleep apnea    does not use cpap   Thrombocytopenia (HCC)    Thyroid  disease     Current Outpatient Medications  Medication Sig Dispense Refill   acetaminophen  (TYLENOL ) 500 MG tablet Take 500 mg by mouth every 6 (six) hours as needed for moderate pain.      amLODipine  (NORVASC ) 10 MG tablet TAKE 1 TABLET(10 MG) BY MOUTH DAILY 90 tablet 1   aspirin  EC 81 MG tablet Take 81 mg by mouth daily. Swallow whole.     atorvastatin  (LIPITOR) 40 MG tablet TAKE 1 TABLET(40 MG) BY MOUTH DAILY 90 tablet 2   citalopram  (CELEXA ) 20 MG tablet TAKE 1 TABLET(20 MG) BY MOUTH DAILY 90 tablet 1   levothyroxine  (SYNTHROID ) 150 MCG tablet TAKE 1 TABLET(150 MCG) BY MOUTH DAILY 90 tablet 1   losartan  (COZAAR ) 100 MG tablet TAKE 1 TABLET(100 MG) BY MOUTH DAILY 90 tablet 1   metoprolol  succinate (TOPROL -XL) 50 MG 24 hr tablet TAKE 1 TABLET BY MOUTH EVERY DAY WITH OR IMMEDIATELY FOLLOWING A MEAL 90 tablet 1   Multiple Vitamin (MULTIVITAMIN) capsule Take 1 capsule by mouth daily.     traZODone  (DESYREL ) 50 MG tablet TAKE 1 TABLET(50 MG) BY MOUTH AT BEDTIME AS NEEDED FOR SLEEP 90 tablet 1   No current facility-administered medications for this visit.  Allergies  Allergen Reactions   Codeine Nausea Only    Tolerates oxycodone    Morphine  Nausea And Vomiting   Ace Inhibitors Cough   Adhesive [Tape] Other (See Comments) and Rash    whelps    Family History  Problem Relation Age of Onset   Dementia Mother    Dementia Sister    Heart attack Brother    Diabetes Brother    Breast cancer Paternal Aunt    Heart attack Son    Hypercalcemia Neg Hx    Cancer Neg Hx     Social History   Socioeconomic History   Marital status: Married    Spouse name: [name]   Number of children: 4   Years of education: Not on file   Highest education level: GED or equivalent  Occupational History   Occupation: retired   Occupation: Retired  Tobacco Use    Smoking status: Every Day    Current packs/day: 0.50    Average packs/day: 0.5 packs/day for 40.0 years (20.0 ttl pk-yrs)    Types: Cigarettes   Smokeless tobacco: Never  Vaping Use   Vaping status: Never Used  Substance and Sexual Activity   Alcohol  use: No   Drug use: No   Sexual activity: Not Currently  Other Topics Concern   Not on file  Social History Narrative   Not on file   Social Drivers of Health   Financial Resource Strain: Low Risk  (05/16/2023)   Received from [facility]   Overall Financial Resource Strain (CARDIA)    Difficulty of Paying Living Expenses: Not very hard  Food Insecurity: No Food Insecurity (08/18/2023)   Hunger Vital Sign    Worried About Running Out of Food in the Last Year: Never true    Ran Out of Food in the Last Year: Never true  Transportation Needs: No Transportation Needs (05/16/2023)   Received from [facility]   PRAPARE - Transportation    In the past 12 months, has lack of transportation kept you from medical appointments or from getting medications?: No    Lack of Transportation (Non-Medical): No  Physical Activity: Inactive (02/03/2023)   Exercise Vital Sign    Days of Exercise per Week: 0 days    Minutes of Exercise per Session: 30 min  Stress: No Stress Concern Present (08/18/2023)   [employer] of Occupational Health - Occupational Stress Questionnaire    Feeling of Stress : Only a little  Social Connections: Moderately Isolated (02/03/2023)   Social Connection and Isolation Panel [NHANES]    Frequency of Communication with Friends and Family: Never    Frequency of Social Gatherings with Friends and Family: Never    Attends Religious Services: 1 to 4 times per year    Active Member of [employer] or Organizations: No    Attends Banker Meetings: Never    Marital Status: Married  Catering Manager Violence: Not At Risk (12/26/2022)   Humiliation, Afraid, Rape, and Kick questionnaire     Fear of Current or Ex-Partner: No    Emotionally Abused: No    Physically Abused: No    Sexually Abused: No     Constitutional: Denies fever, malaise, fatigue, headache or abrupt weight changes.  HEENT: Denies eye pain, eye redness, ear pain, ringing in the ears, wax buildup, runny nose, nasal congestion, bloody nose, or sore throat. Respiratory: Denies difficulty breathing, shortness of breath, cough or sputum production.   Cardiovascular: Denies chest pain, chest tightness, palpitations  or swelling in the hands or feet.  Gastrointestinal: Denies abdominal pain, bloating, constipation, diarrhea or blood in the stool.  GU: Pt reports urge incontinence. Denies frequency, pain with urination, burning sensation, blood in urine, odor or discharge. Musculoskeletal: Patient reports joint pain.  Denies decrease in range of motion, difficulty with gait, muscle pain or joint swelling.  Skin: Denies redness, rashes, lesions or ulcercations.  Neurological: Patient reports insomnia, restless legs and difficulty with memory. Denies dizziness, difficulty with speech or problems with balance and coordination.  Psych: Patient has a history of anxiety.  Denies depression, SI/HI.  No other specific complaints in a complete review of systems (except as listed in HPI above).  Objective:   Physical Exam  BP 112/68 (BP Location: Right Arm, Patient Position: Sitting, Cuff Size: Normal)   Ht 5' 1.5 (1.562 m)   Wt 143 lb 9.6 oz (65.1 kg)   LMP  (LMP Unknown)   BMI 26.69 kg/m    Wt Readings from Last 3 Encounters:  05/07/23 146 lb (66.2 kg)  04/15/23 146 lb (66.2 kg)  03/30/23 152 lb (68.9 kg)    General: Appears her stated age, overweight, in NAD. Skin: Warm, dry and intact.  Spider veins noted of BLE. HEENT: Head: normal shape and size; Eyes: sclera white, no icterus, conjunctiva pink, PERRLA and EOMs intact;  Neck:  Neck supple, trachea midline. No masses, lumps or thyromegaly present but  thyroid  tender with palpation.  Cardiovascular: Bradycardic with normal rhythm. S1,S2 noted.  No murmur, rubs or gallops noted. No JVD or BLE edema. No carotid bruits noted. Pulmonary/Chest: Normal effort and coarse breath sounds. No respiratory distress. No wheezes, rales or ronchi noted.  Abdomen: Normal bowel sounds.  Musculoskeletal: Kyphotic.  Shuffling gait without device.  Neurological: Alert and oriented. Trouble with recall. Repeats herself often. Coordination normal.  Psychiatric: Mood and affect normal. Behavior is normal. Judgment and thought content normal.     BMET    Component Value Date/Time   NA 144 03/30/2023 0914   NA 145 (H) 12/21/2017 1017   NA 139 08/26/2012 2035   K 4.0 03/30/2023 0914   K 3.3 (L) 08/26/2012 2035   CL 114 (H) 03/30/2023 0914   CL 105 08/26/2012 2035   CO2 25 03/30/2023 0914   CO2 29 08/26/2012 2035   GLUCOSE 90 03/30/2023 0914   GLUCOSE 97 08/26/2012 2035   BUN 12 03/30/2023 0914   BUN 13 12/21/2017 1017   BUN 13 08/26/2012 2035   CREATININE 0.53 (L) 03/30/2023 0914   CALCIUM  9.7 03/30/2023 0914   CALCIUM  9.9 08/26/2012 2035   GFRNONAA >60 07/18/2022 0928   GFRNONAA 66 09/11/2020 1147   GFRAA 76 09/11/2020 1147    Lipid Panel     Component Value Date/Time   CHOL 128 03/30/2023 0914   CHOL 181 12/21/2017 1017   TRIG 141 03/30/2023 0914   HDL 49 (L) 03/30/2023 0914   HDL 50 12/21/2017 1017   CHOLHDL 2.6 03/30/2023 0914   LDLCALC 57 03/30/2023 0914    CBC    Component Value Date/Time   WBC 6.9 03/30/2023 0914   RBC 3.87 03/30/2023 0914   HGB 13.4 03/30/2023 0914   HGB 14.8 12/21/2017 1017   HCT 40.1 03/30/2023 0914   HCT 42.5 12/21/2017 1017   PLT 113 (L) 03/30/2023 0914   PLT 141 (L) 12/21/2017 1017   MCV 103.6 (H) 03/30/2023 0914   MCV 93 12/21/2017 1017   MCV 92 08/26/2012 2035  MCH 34.6 (H) 03/30/2023 0914   MCHC 33.4 03/30/2023 0914   RDW 12.5 03/30/2023 0914   RDW 12.8 12/21/2017 1017   RDW 13.1 08/26/2012  2035   LYMPHSABS 2.8 02/11/2022 1307   LYMPHSABS 2.5 12/21/2017 1017   MONOABS 0.8 02/11/2022 1307   EOSABS 0.1 02/11/2022 1307   EOSABS 0.3 12/21/2017 1017   BASOSABS 0.1 02/11/2022 1307   BASOSABS 0.0 12/21/2017 1017    Hgb A1C Lab Results  Component Value Date   HGBA1C 5.3 03/30/2023            Assessment & Plan:     RTC in 6 months for your annual exam Angeline Laura, NP

## 2023-09-30 NOTE — Assessment & Plan Note (Signed)
 TSH and free T4 today We will adjust levothyroxine if needed based on labs

## 2023-09-30 NOTE — Assessment & Plan Note (Signed)
Controlled on amlodipine, losartan and metoprolol CMET today

## 2023-09-30 NOTE — Assessment & Plan Note (Signed)
 CBC today.

## 2023-09-30 NOTE — Assessment & Plan Note (Signed)
CMET and lipid profile today Encouraged her to consume a low fat diet Continue atorvastatin and aspirin 

## 2023-09-30 NOTE — Patient Instructions (Signed)
Mild Neurocognitive Disorder Mild neurocognitive disorder, formerly known as mild cognitive impairment, is a disorder in which memory does not work as well as it should. This disorder may also cause problems with other mental functions, including thought, communication, behavior, and completion of tasks. These problems can be noticed and measured, but they usually do not interfere with daily activities or the ability to live independently. Mild neurocognitive disorder typically develops after 82 years of age, but it can also develop at younger ages. It is not as serious as major neurocognitive disorder, also known as dementia, but it may be the first sign of it. Generally, symptoms of this condition get worse over time. In rare cases, symptoms can get better. What are the causes? This condition may be caused by: Brain disorders like Alzheimer's disease, Parkinson's disease, and other conditions that gradually damage nerve cells (neurodegenerative conditions). Diseases that affect blood vessels in the brain and result in small strokes. Certain infections, such as HIV. Traumatic brain injury. Other medical conditions, such as brain tumors, underactive thyroid (hypothyroidism), and vitamin B12 deficiency. Use of certain drugs or prescription medicines. What increases the risk? The following factors may make you more likely to develop this condition: Being older than 65 years. Being female. Low education level. Diabetes, high blood pressure, high cholesterol, and other conditions that increase the risk for blood vessel diseases. Untreated or undertreated sleep apnea. Having a certain type of gene that can be passed from parent to child (inherited). Chronic health problems such as heart disease, lung disease, liver disease, kidney disease, or depression. What are the signs or symptoms? Symptoms of this condition include: Difficulty remembering. You may: Forget names, phone numbers, or details of  recent events. Forget social events and appointments. Repeatedly forget where you put your car keys or other items. Difficulty thinking and solving problems. You may have trouble with complex tasks, such as: Paying bills. Driving in unfamiliar places. Difficulty communicating. You may have trouble: Finding the right word or naming an object. Forming a sentence that makes sense, or understanding what you read or hear. Changes in your behavior or personality. When this happens, you may: Lose interest in the things that you used to enjoy. Withdraw from social situations. Get angry more easily than usual. Act before thinking. How is this diagnosed? This condition is diagnosed based on: Your symptoms. Your health care provider may ask you and the people you spend time with, such as family and friends, about your symptoms. Evaluation of mental functions (neuropsychological testing). Your health care provider may refer you to a neurologist or mental health specialist to evaluate your mental functions in detail. To identify the cause of your condition, your health care provider may: Get a detailed medical history. Ask about use of alcohol, drugs, and prescription medicines. Do a physical exam. Order blood tests and brain imaging exams. How is this treated? Mild neurocognitive disorder that is caused by medicine use, drug use, infection, or another medical condition may improve when the cause is treated, or when medicines or drugs are stopped. If this disorder has another cause, it generally does not improve and may get worse. In these cases, the goal of treatment is to help you manage the loss of mental function. Treatments in these cases include: Medicine. Medicine mainly helps memory and behavior symptoms. Talk therapy. Talk therapy provides education, emotional support, memory aids, and other ways of making up for problems with mental function. Lifestyle changes, including: Getting regular  exercise. Eating a healthy diet  that includes omega-3 fatty acids. Challenging your thinking and memory skills. Having more social interaction. Follow these instructions at home: Eating and drinking  Drink enough fluid to keep your urine pale yellow. Eat a healthy diet that includes omega-3 fatty acids. These can be found in: Fish. Nuts. Leafy vegetables. Vegetable oils. If you drink alcohol: Limit how much you use to: 0-1 drink a day for women. 0-2 drinks a day for men. Be aware of how much alcohol is in your drink. In the [place] one drink equals one 12 oz bottle of beer (355 mL), one 5 oz glass of wine (148 mL), or one 1 oz glass of hard liquor (44 mL). Lifestyle  Get regular exercise as told by your health care provider. Do not use any products that contain nicotine or tobacco, such as cigarettes, e-cigarettes, and chewing tobacco. If you need help quitting, ask your health care provider. Practice ways to manage stress. If you need help managing stress, ask your health care provider. Continue to have social interaction. Keep your mind active with stimulating activities you enjoy, such as reading or playing games. Make sure to get quality sleep. Follow these tips: Avoid napping during the day. Keep your sleeping area dark and cool. Avoid exercising during the few hours before you go to bed. Avoid caffeine products in the evening. General instructions Take over-the-counter and prescription medicines only as told by your health care provider. Your health care provider may recommend that you avoid taking medicines that can affect thinking, such as pain medicines or sleep medicines. Work with your health care provider to find out what you need help with and what your safety needs are. Keep all follow-up visits. This is important. Where to find more information [employer] on Aging: https://walker.com/ Contact a health care provider if: You have any new symptoms. Get help right  away if: You develop new confusion or your confusion gets worse. You act in ways that place you or your family in danger. Summary Mild neurocognitive disorder is a disorder in which memory does not work as well as it should. Mild neurocognitive disorder can have many causes. It may be the first stage of dementia. To manage your condition, get regular exercise, keep your mind active, get quality sleep, and eat a healthy diet. This information is not intended to replace advice given to you by your health care provider. Make sure you discuss any questions you have with your health care provider. Document Revised: 01/23/2020 Document Reviewed: 01/23/2020 Elsevier Patient Education  2024 [employer].

## 2023-09-30 NOTE — Assessment & Plan Note (Signed)
C-Met and lipid profile today Encouraged her to consume low-fat diet Continue atorvastatin 

## 2023-09-30 NOTE — Assessment & Plan Note (Signed)
 Not medicated We will monitor

## 2023-09-30 NOTE — Assessment & Plan Note (Signed)
Continue citalopram Support offered 

## 2023-09-30 NOTE — Assessment & Plan Note (Signed)
She declines use of inhalers at this time Will monitor

## 2023-09-30 NOTE — Assessment & Plan Note (Signed)
Continue trazodone We will monitor

## 2023-09-30 NOTE — Assessment & Plan Note (Signed)
 Encourage diet and exercise for weight loss

## 2023-09-30 NOTE — Assessment & Plan Note (Signed)
C-Met today 

## 2023-09-30 NOTE — Assessment & Plan Note (Signed)
 Continue Tylenol as needed

## 2023-10-01 LAB — CBC
HCT: 46.2 % — ABNORMAL HIGH (ref 35.0–45.0)
Hemoglobin: 15 g/dL (ref 11.7–15.5)
MCH: 33.1 pg — ABNORMAL HIGH (ref 27.0–33.0)
MCHC: 32.5 g/dL (ref 32.0–36.0)
MCV: 102 fL — ABNORMAL HIGH (ref 80.0–100.0)
MPV: 13 fL — ABNORMAL HIGH (ref 7.5–12.5)
Platelets: 130 10*3/uL — ABNORMAL LOW (ref 140–400)
RBC: 4.53 10*6/uL (ref 3.80–5.10)
RDW: 11.6 % (ref 11.0–15.0)
WBC: 6.3 10*3/uL (ref 3.8–10.8)

## 2023-10-01 LAB — COMPLETE METABOLIC PANEL WITH GFR
AG Ratio: 1.8 (calc) (ref 1.0–2.5)
ALT: 10 U/L (ref 6–29)
AST: 14 U/L (ref 10–35)
Albumin: 4.4 g/dL (ref 3.6–5.1)
Alkaline phosphatase (APISO): 63 U/L (ref 37–153)
BUN: 15 mg/dL (ref 7–25)
CO2: 27 mmol/L (ref 20–32)
Calcium: 10.3 mg/dL (ref 8.6–10.4)
Chloride: 106 mmol/L (ref 98–110)
Creat: 0.63 mg/dL (ref 0.60–0.95)
Globulin: 2.4 g/dL (ref 1.9–3.7)
Glucose, Bld: 82 mg/dL (ref 65–99)
Potassium: 4.1 mmol/L (ref 3.5–5.3)
Sodium: 142 mmol/L (ref 135–146)
Total Bilirubin: 0.8 mg/dL (ref 0.2–1.2)
Total Protein: 6.8 g/dL (ref 6.1–8.1)
eGFR: 89 mL/min/{1.73_m2} (ref >=60)

## 2023-10-01 LAB — HEMOGLOBIN A1C
Hgb A1c MFr Bld: 5.3 %{Hb} (ref <5.7)
Mean Plasma Glucose: 105 mg/dL
eAG (mmol/L): 5.8 mmol/L

## 2023-10-01 LAB — LIPID PANEL
Cholesterol: 202 mg/dL — ABNORMAL HIGH (ref <200)
HDL: 55 mg/dL (ref >=50)
LDL Cholesterol (Calc): 122 mg/dL — ABNORMAL HIGH
Non-HDL Cholesterol (Calc): 147 mg/dL — ABNORMAL HIGH (ref <130)
Total CHOL/HDL Ratio: 3.7 (calc) (ref <5.0)
Triglycerides: 136 mg/dL (ref <150)

## 2023-10-01 LAB — TSH+FREE T4: TSH W/REFLEX TO FT4: 3.65 m[IU]/L (ref 0.40–4.50)

## 2023-10-03 ENCOUNTER — Encounter: Payer: Self-pay | Admitting: Internal Medicine

## 2023-10-03 ENCOUNTER — Other Ambulatory Visit: Payer: Self-pay

## 2023-10-03 ENCOUNTER — Inpatient Hospital Stay
Admission: EM | Admit: 2023-10-03 | Discharge: 2023-10-15 | DRG: 481 | Disposition: A | Discharge status: Skilled Nursing Facility | Payer: PPO | Attending: Internal Medicine | Admitting: Internal Medicine

## 2023-10-03 ENCOUNTER — Inpatient Hospital Stay: Payer: PPO

## 2023-10-03 ENCOUNTER — Emergency Department: Payer: PPO

## 2023-10-03 DIAGNOSIS — S72002A Fracture of unspecified part of neck of left femur, initial encounter for closed fracture: Secondary | ICD-10-CM

## 2023-10-03 DIAGNOSIS — E876 Hypokalemia: Secondary | ICD-10-CM | POA: Diagnosis present

## 2023-10-03 DIAGNOSIS — F32A Depression, unspecified: Secondary | ICD-10-CM | POA: Diagnosis not present

## 2023-10-03 DIAGNOSIS — Z7982 Long term (current) use of aspirin: Secondary | ICD-10-CM | POA: Diagnosis not present

## 2023-10-03 DIAGNOSIS — I10 Essential (primary) hypertension: Secondary | ICD-10-CM | POA: Diagnosis not present

## 2023-10-03 DIAGNOSIS — Y92512 Supermarket, store or market as the place of occurrence of the external cause: Secondary | ICD-10-CM | POA: Diagnosis not present

## 2023-10-03 DIAGNOSIS — Z8249 Family history of ischemic heart disease and other diseases of the circulatory system: Secondary | ICD-10-CM | POA: Diagnosis not present

## 2023-10-03 DIAGNOSIS — G2581 Restless legs syndrome: Secondary | ICD-10-CM | POA: Diagnosis present

## 2023-10-03 DIAGNOSIS — Z6826 Body mass index (BMI) 26.0-26.9, adult: Secondary | ICD-10-CM

## 2023-10-03 DIAGNOSIS — I358 Other nonrheumatic aortic valve disorders: Secondary | ICD-10-CM | POA: Diagnosis not present

## 2023-10-03 DIAGNOSIS — Z043 Encounter for examination and observation following other accident: Secondary | ICD-10-CM | POA: Diagnosis not present

## 2023-10-03 DIAGNOSIS — I6782 Cerebral ischemia: Secondary | ICD-10-CM | POA: Diagnosis not present

## 2023-10-03 DIAGNOSIS — Z803 Family history of malignant neoplasm of breast: Secondary | ICD-10-CM | POA: Diagnosis not present

## 2023-10-03 DIAGNOSIS — Z888 Allergy status to other drugs, medicaments and biological substances status: Secondary | ICD-10-CM

## 2023-10-03 DIAGNOSIS — Z6828 Body mass index (BMI) 28.0-28.9, adult: Secondary | ICD-10-CM

## 2023-10-03 DIAGNOSIS — Z7901 Long term (current) use of anticoagulants: Secondary | ICD-10-CM

## 2023-10-03 DIAGNOSIS — S72142A Displaced intertrochanteric fracture of left femur, initial encounter for closed fracture: Secondary | ICD-10-CM | POA: Diagnosis not present

## 2023-10-03 DIAGNOSIS — Z96652 Presence of left artificial knee joint: Secondary | ICD-10-CM | POA: Diagnosis not present

## 2023-10-03 DIAGNOSIS — M8580 Other specified disorders of bone density and structure, unspecified site: Secondary | ICD-10-CM | POA: Diagnosis not present

## 2023-10-03 DIAGNOSIS — W000XXA Fall on same level due to ice and snow, initial encounter: Secondary | ICD-10-CM | POA: Diagnosis present

## 2023-10-03 DIAGNOSIS — R0602 Shortness of breath: Secondary | ICD-10-CM | POA: Diagnosis not present

## 2023-10-03 DIAGNOSIS — R41841 Cognitive communication deficit: Secondary | ICD-10-CM | POA: Diagnosis not present

## 2023-10-03 DIAGNOSIS — F17210 Nicotine dependence, cigarettes, uncomplicated: Secondary | ICD-10-CM | POA: Diagnosis present

## 2023-10-03 DIAGNOSIS — M25552 Pain in left hip: Secondary | ICD-10-CM | POA: Diagnosis not present

## 2023-10-03 DIAGNOSIS — E663 Overweight: Secondary | ICD-10-CM | POA: Diagnosis not present

## 2023-10-03 DIAGNOSIS — M25472 Effusion, left ankle: Secondary | ICD-10-CM | POA: Diagnosis not present

## 2023-10-03 DIAGNOSIS — F0394 Unspecified dementia, unspecified severity, with anxiety: Secondary | ICD-10-CM | POA: Diagnosis present

## 2023-10-03 DIAGNOSIS — M25572 Pain in left ankle and joints of left foot: Secondary | ICD-10-CM | POA: Insufficient documentation

## 2023-10-03 DIAGNOSIS — W19XXXA Unspecified fall, initial encounter: Secondary | ICD-10-CM | POA: Diagnosis not present

## 2023-10-03 DIAGNOSIS — D696 Thrombocytopenia, unspecified: Secondary | ICD-10-CM | POA: Diagnosis not present

## 2023-10-03 DIAGNOSIS — G4733 Obstructive sleep apnea (adult) (pediatric): Secondary | ICD-10-CM | POA: Diagnosis present

## 2023-10-03 DIAGNOSIS — Z01811 Encounter for preprocedural respiratory examination: Principal | ICD-10-CM

## 2023-10-03 DIAGNOSIS — E785 Hyperlipidemia, unspecified: Secondary | ICD-10-CM | POA: Diagnosis not present

## 2023-10-03 DIAGNOSIS — F419 Anxiety disorder, unspecified: Secondary | ICD-10-CM | POA: Diagnosis not present

## 2023-10-03 DIAGNOSIS — S72145A Nondisplaced intertrochanteric fracture of left femur, initial encounter for closed fracture: Secondary | ICD-10-CM | POA: Diagnosis not present

## 2023-10-03 DIAGNOSIS — Z85828 Personal history of other malignant neoplasm of skin: Secondary | ICD-10-CM | POA: Diagnosis not present

## 2023-10-03 DIAGNOSIS — I959 Hypotension, unspecified: Secondary | ICD-10-CM | POA: Diagnosis not present

## 2023-10-03 DIAGNOSIS — F03918 Unspecified dementia, unspecified severity, with other behavioral disturbance: Secondary | ICD-10-CM | POA: Diagnosis present

## 2023-10-03 DIAGNOSIS — E7800 Pure hypercholesterolemia, unspecified: Secondary | ICD-10-CM | POA: Diagnosis not present

## 2023-10-03 DIAGNOSIS — Z79899 Other long term (current) drug therapy: Secondary | ICD-10-CM | POA: Diagnosis not present

## 2023-10-03 DIAGNOSIS — Z91048 Other nonmedicinal substance allergy status: Secondary | ICD-10-CM

## 2023-10-03 DIAGNOSIS — Z7401 Bed confinement status: Secondary | ICD-10-CM | POA: Diagnosis not present

## 2023-10-03 DIAGNOSIS — F5104 Psychophysiologic insomnia: Secondary | ICD-10-CM | POA: Diagnosis not present

## 2023-10-03 DIAGNOSIS — M79605 Pain in left leg: Secondary | ICD-10-CM | POA: Diagnosis not present

## 2023-10-03 DIAGNOSIS — M6281 Muscle weakness (generalized): Secondary | ICD-10-CM | POA: Diagnosis not present

## 2023-10-03 DIAGNOSIS — Z90710 Acquired absence of both cervix and uterus: Secondary | ICD-10-CM

## 2023-10-03 DIAGNOSIS — S7292XA Unspecified fracture of left femur, initial encounter for closed fracture: Secondary | ICD-10-CM | POA: Diagnosis present

## 2023-10-03 DIAGNOSIS — E039 Hypothyroidism, unspecified: Secondary | ICD-10-CM | POA: Diagnosis not present

## 2023-10-03 DIAGNOSIS — Z9181 History of falling: Secondary | ICD-10-CM | POA: Diagnosis not present

## 2023-10-03 DIAGNOSIS — M25562 Pain in left knee: Secondary | ICD-10-CM | POA: Diagnosis not present

## 2023-10-03 DIAGNOSIS — Z79890 Hormone replacement therapy (postmenopausal): Secondary | ICD-10-CM

## 2023-10-03 DIAGNOSIS — R531 Weakness: Secondary | ICD-10-CM | POA: Diagnosis not present

## 2023-10-03 DIAGNOSIS — Z741 Need for assistance with personal care: Secondary | ICD-10-CM | POA: Diagnosis not present

## 2023-10-03 DIAGNOSIS — I480 Paroxysmal atrial fibrillation: Secondary | ICD-10-CM | POA: Diagnosis not present

## 2023-10-03 DIAGNOSIS — Z833 Family history of diabetes mellitus: Secondary | ICD-10-CM | POA: Diagnosis not present

## 2023-10-03 DIAGNOSIS — R4189 Other symptoms and signs involving cognitive functions and awareness: Secondary | ICD-10-CM | POA: Diagnosis not present

## 2023-10-03 DIAGNOSIS — Z01818 Encounter for other preprocedural examination: Secondary | ICD-10-CM | POA: Diagnosis not present

## 2023-10-03 DIAGNOSIS — I4819 Other persistent atrial fibrillation: Secondary | ICD-10-CM | POA: Diagnosis not present

## 2023-10-03 DIAGNOSIS — Z96642 Presence of left artificial hip joint: Secondary | ICD-10-CM | POA: Diagnosis not present

## 2023-10-03 DIAGNOSIS — F17200 Nicotine dependence, unspecified, uncomplicated: Secondary | ICD-10-CM | POA: Diagnosis not present

## 2023-10-03 DIAGNOSIS — I4891 Unspecified atrial fibrillation: Secondary | ICD-10-CM

## 2023-10-03 DIAGNOSIS — S7292XD Unspecified fracture of left femur, subsequent encounter for closed fracture with routine healing: Secondary | ICD-10-CM | POA: Diagnosis not present

## 2023-10-03 DIAGNOSIS — R2689 Other abnormalities of gait and mobility: Secondary | ICD-10-CM | POA: Diagnosis not present

## 2023-10-03 DIAGNOSIS — Z4789 Encounter for other orthopedic aftercare: Secondary | ICD-10-CM | POA: Diagnosis not present

## 2023-10-03 DIAGNOSIS — M6259 Muscle wasting and atrophy, not elsewhere classified, multiple sites: Secondary | ICD-10-CM | POA: Diagnosis not present

## 2023-10-03 DIAGNOSIS — Z9682 Presence of neurostimulator: Secondary | ICD-10-CM

## 2023-10-03 DIAGNOSIS — Z885 Allergy status to narcotic agent status: Secondary | ICD-10-CM

## 2023-10-03 LAB — BASIC METABOLIC PANEL
Anion gap: 11 (ref 5–15)
BUN: 15 mg/dL (ref 8–23)
CO2: 23 mmol/L (ref 22–32)
Calcium: 9.6 mg/dL (ref 8.9–10.3)
Chloride: 104 mmol/L (ref 98–111)
Creatinine, Ser: 0.59 mg/dL (ref 0.44–1.00)
GFR, Estimated: >60 mL/min (ref >60)
Glucose, Bld: 99 mg/dL (ref 70–99)
Potassium: 3.4 mmol/L — ABNORMAL LOW (ref 3.5–5.1)
Sodium: 138 mmol/L (ref 135–145)

## 2023-10-03 LAB — CBC WITH DIFFERENTIAL/PLATELET
Abs Immature Granulocytes: 0.1 10*3/uL — ABNORMAL HIGH (ref 0.00–0.07)
Basophils Absolute: 0.1 10*3/uL (ref 0.0–0.1)
Basophils Relative: 1 %
Eosinophils Absolute: 0.1 10*3/uL (ref 0.0–0.5)
Eosinophils Relative: 1 %
HCT: 46.8 % — ABNORMAL HIGH (ref 36.0–46.0)
Hemoglobin: 15.8 g/dL — ABNORMAL HIGH (ref 12.0–15.0)
Immature Granulocytes: 1 %
Lymphocytes Relative: 11 %
Lymphs Abs: 1.3 10*3/uL (ref 0.7–4.0)
MCH: 34.3 pg — ABNORMAL HIGH (ref 26.0–34.0)
MCHC: 33.8 g/dL (ref 30.0–36.0)
MCV: 101.7 fL — ABNORMAL HIGH (ref 80.0–100.0)
Monocytes Absolute: 0.9 10*3/uL (ref 0.1–1.0)
Monocytes Relative: 8 %
Neutro Abs: 9.3 10*3/uL — ABNORMAL HIGH (ref 1.7–7.7)
Neutrophils Relative %: 78 %
Platelets: 130 10*3/uL — ABNORMAL LOW (ref 150–400)
RBC: 4.6 MIL/uL (ref 3.87–5.11)
RDW: 12.5 % (ref 11.5–15.5)
WBC: 11.7 10*3/uL — ABNORMAL HIGH (ref 4.0–10.5)
nRBC: 0 % (ref 0.0–0.2)

## 2023-10-03 LAB — SAMPLE TO BLOOD BANK

## 2023-10-03 MED ORDER — METOPROLOL SUCCINATE ER 50 MG PO TB24
50.0000 mg | ORAL_TABLET | Freq: Every day | ORAL | Status: DC
Start: 2023-10-04 — End: 2023-10-05
  Administered 2023-10-04 – 2023-10-05 (×2): 50 mg via ORAL
  Filled 2023-10-03 (×2): qty 1

## 2023-10-03 MED ORDER — FENTANYL CITRATE PF 50 MCG/ML IJ SOSY
50.0000 ug | PREFILLED_SYRINGE | Freq: Once | INTRAMUSCULAR | Status: AC
  Administered 2023-10-03: 50 ug via INTRAVENOUS

## 2023-10-03 MED ORDER — SODIUM CHLORIDE 0.9 % IV SOLN: 12.5000 mg | Freq: Three times a day (TID) | INTRAVENOUS | Status: AC | PRN

## 2023-10-03 MED ORDER — SENNOSIDES-DOCUSATE SODIUM 8.6-50 MG PO TABS: 1.0000 | ORAL_TABLET | Freq: Every evening | ORAL | Status: DC | PRN

## 2023-10-03 MED ORDER — HYDROCODONE-ACETAMINOPHEN 5-325 MG PO TABS
1.0000 | ORAL_TABLET | Freq: Four times a day (QID) | ORAL | Status: DC | PRN
  Administered 2023-10-03 – 2023-10-04 (×2): 1 via ORAL
  Filled 2023-10-03 (×2): qty 1

## 2023-10-03 MED ORDER — AMLODIPINE BESYLATE 10 MG PO TABS
10.0000 mg | ORAL_TABLET | Freq: Every day | ORAL | Status: DC
  Administered 2023-10-04 – 2023-10-06 (×3): 10 mg via ORAL
  Filled 2023-10-03 (×3): qty 1

## 2023-10-03 MED ORDER — HYDRALAZINE HCL 20 MG/ML IJ SOLN: 5.0000 mg | Freq: Four times a day (QID) | INTRAMUSCULAR | Status: AC | PRN

## 2023-10-03 MED ORDER — CITALOPRAM HYDROBROMIDE 20 MG PO TABS
20.0000 mg | ORAL_TABLET | Freq: Every day | ORAL | Status: DC
  Administered 2023-10-04 – 2023-10-15 (×12): 20 mg via ORAL
  Filled 2023-10-03 (×7): qty 1
  Filled 2023-10-03: qty 2
  Filled 2023-10-03 (×4): qty 1

## 2023-10-03 MED ORDER — FENTANYL CITRATE PF 50 MCG/ML IJ SOSY
50.0000 ug | PREFILLED_SYRINGE | Freq: Once | INTRAMUSCULAR | Status: AC
  Administered 2023-10-03: 50 ug via INTRAVENOUS
  Filled 2023-10-03: qty 1

## 2023-10-03 MED ORDER — CEFAZOLIN SODIUM-DEXTROSE 2-4 GM/100ML-% IV SOLN
2.0000 g | INTRAVENOUS | Status: AC
  Administered 2023-10-04: 2 g via INTRAVENOUS
  Filled 2023-10-03: qty 100

## 2023-10-03 MED ORDER — ONDANSETRON HCL 4 MG/2ML IJ SOLN: 4.0000 mg | Freq: Four times a day (QID) | INTRAMUSCULAR | Status: DC | PRN

## 2023-10-03 MED ORDER — LEVOTHYROXINE SODIUM 50 MCG PO TABS: 150.0000 ug | ORAL_TABLET | ORAL | Status: DC

## 2023-10-03 MED ORDER — FENTANYL CITRATE PF 50 MCG/ML IJ SOSY
PREFILLED_SYRINGE | INTRAMUSCULAR | Status: AC
  Filled 2023-10-03: qty 1

## 2023-10-03 MED ORDER — LOSARTAN POTASSIUM 50 MG PO TABS
100.0000 mg | ORAL_TABLET | Freq: Every day | ORAL | Status: DC
  Administered 2023-10-04 – 2023-10-07 (×4): 100 mg via ORAL
  Filled 2023-10-03 (×4): qty 2

## 2023-10-03 MED ORDER — LEVOTHYROXINE SODIUM 50 MCG PO TABS
150.0000 ug | ORAL_TABLET | ORAL | Status: DC
  Administered 2023-10-05 – 2023-10-15 (×10): 150 ug via ORAL
  Filled 2023-10-03 (×10): qty 1

## 2023-10-03 MED ORDER — MORPHINE SULFATE (PF) 2 MG/ML IV SOLN
1.0000 mg | INTRAVENOUS | Status: DC | PRN
  Administered 2023-10-03: 1 mg via INTRAVENOUS
  Filled 2023-10-03: qty 1

## 2023-10-03 MED ORDER — ATORVASTATIN CALCIUM 20 MG PO TABS
40.0000 mg | ORAL_TABLET | Freq: Every day | ORAL | Status: DC
  Administered 2023-10-04 – 2023-10-14 (×11): 40 mg via ORAL
  Filled 2023-10-03 (×11): qty 2

## 2023-10-03 MED ORDER — TRAZODONE HCL 50 MG PO TABS
50.0000 mg | ORAL_TABLET | Freq: Every evening | ORAL | Status: DC | PRN
  Administered 2023-10-03 – 2023-10-14 (×9): 50 mg via ORAL
  Filled 2023-10-03 (×8): qty 1

## 2023-10-03 NOTE — ED Notes (Signed)
 Pt taken to XR.

## 2023-10-03 NOTE — Assessment & Plan Note (Signed)
 Home citalopram 20 mg daily resumed

## 2023-10-03 NOTE — ED Triage Notes (Signed)
 Pt BIB [facility], pt fell at [employer] parking lot, was able to get into car with help and drove home. EMS picked pt up at home when she couldn't get out of car. Pt complaining of Left leg pain, primarily in left knee. Pt knows she fell, does not remember passing out. Pt stated she thinks she just tripped or slipped on black ice. Pt denies any pain anywhere else. Pt AOx4

## 2023-10-03 NOTE — Hospital Course (Addendum)
 Ms. [place] Dalporto with hypertension, hyperlipidemia, depression, anxiety, hypothyroid, anemia, who presents ED for chief concerns of falling at [employer] parking lot.  Per ED documentation, patient was still able to get into her car and drove home for help.  EMS came to her home and at that time she was not able to get out of the car.  Vitals in the ED showed temperature of 98.6, respiration rate of 14, heart rate of 87, blood pressure 152/74, SpO2 of 96% on room air.  Serum sodium is 138, potassium 3.4, chloride 104, bicarb 23, BUN of 15, serum creatinine 0.59, EGFR greater than 60, nonfasting blood glucose 99, WBC 11.7, platelets of 130, hemoglobin 15.8.  ED treatment: Fentanyl  50 mcg IV one-time dose.

## 2023-10-03 NOTE — ED Notes (Signed)
 Pt placed on purewick as pt needs to urinate.

## 2023-10-03 NOTE — Assessment & Plan Note (Signed)
 N.p.o. after midnight Symptomatic support: Norco 5-325 mg p.o. every 6 hours as needed for moderate pain, 2 days ordered; morphine  1 mg IV every 4 hours as needed for severe pain, 1 day ordered AM team to continue opioid pain medication as appropriate Orthopedic service has been consulted and is aware Per EDP, plan for orthopedic procedure in the a.m.

## 2023-10-03 NOTE — Assessment & Plan Note (Signed)
Home trazodone 50 mg nightly as needed for sleep resumed

## 2023-10-03 NOTE — ED Provider Notes (Signed)
 [facility] Provider Note    Event Date/Time   First MD Initiated Contact with Patient 10/03/23 1616     (approximate)   History   Fall   HPI  [name] is a 82 y.o. female who presents to the emergency department today because of concerns for left leg pain after a fall.  The patient was coming out of a grocery store when she states she lost her balance.  She denies any lightheadedness, passing out or chest pain prior to the fall.  She does not exactly recall how she fell onto her left leg.  Most of the pain is in her distal left upper leg.  She denies hitting her head or any other injuries.     Physical Exam   Triage Vital Signs: ED Triage Vitals  Encounter Vitals Group     BP --      Systolic BP Percentile --      Diastolic BP Percentile --      Pulse --      Resp --      Temp --      Temp src --      SpO2 10/03/23 1612 97 %     Weight 10/03/23 1615 143 lb 4.8 oz (65 kg)     Height 10/03/23 1615 5' 1 (1.549 m)     Head Circumference --      Peak Flow --      Pain Score 10/03/23 1615 10     Pain Loc --      Pain Education --      Exclude from Growth Chart --     Most recent vital signs: Vitals:   10/03/23 1612  SpO2: 97%   General: Awake, alert, oriented. CV:  Good peripheral perfusion. Regular rate and rhythm. Resp:  Normal effort. Lungs clear. Abd:  No distention.  Other:  Left leg with tenderness to distal thigh. NV intact distally.   ED Results / Procedures / Treatments   Labs (all labs ordered are listed, but only abnormal results are displayed) Labs Reviewed  CBC WITH DIFFERENTIAL/PLATELET  BASIC METABOLIC PANEL     EKG  I, Guadalupe Eagles, attending physician, personally viewed and interpreted this EKG  EKG Time: 1656 Rate: 92 Rhythm: sinus rhythm Axis: normal Intervals: qtc 350 QRS: narrow ST changes: no st elevation Impression: normal ekg   RADIOLOGY I independently interpreted and visualized  the left hip. My interpretation: hip fracture Radiology interpretation:  IMPRESSION:  1. Acute nondisplaced intertrochanteric left femur fracture.    I independently interpreted and visualized the left knee. My interpretation: no fracture/dislocation Radiology interpretation:  IMPRESSION:  1. No acute osseous abnormality.      PROCEDURES:  Critical Care performed: No    MEDICATIONS ORDERED IN ED: Medications  fentaNYL  (SUBLIMAZE ) 50 MCG/ML injection (has no administration in time range)  fentaNYL  (SUBLIMAZE ) injection 50 mcg (50 mcg Intravenous Given 10/03/23 1621)     IMPRESSION / MDM / ASSESSMENT AND PLAN / ED COURSE  I reviewed the triage vital signs and the nursing notes.                              Differential diagnosis includes, but is not limited to, fracture, contusion, dislocation  Patient's presentation is most consistent with acute presentation with potential threat to life or bodily function.  Patient presents to the emergency department today because of concerns for  left leg pain after a fall.  On exam patient is quite tender to the distal left upper leg.  Compartments are soft.  Neurovascularly intact.  Will obtain x-rays to evaluate for possible fracture. Will give pain medication.  X-ray of the hip consistent with hip fracture. Discussed result with patient. Discussed with Dr. Edie with orthopedics. Discussed with Dr. Sherre with the hospitalist service who will evaluate for admission.      FINAL CLINICAL IMPRESSION(S) / ED DIAGNOSES   Final diagnoses:  Closed fracture of left hip, initial encounter [facility])     Note:  This document was prepared using Dragon voice recognition software and may include unintentional dictation errors.    Floy Roberts, MD 10/03/23 [phone]

## 2023-10-03 NOTE — Assessment & Plan Note (Signed)
 Levothyroxine 150 mcg daily every Monday through Saturday; skip Sunday.  This was resumed on admission for first dose on 10/05/2023

## 2023-10-03 NOTE — H&P (Signed)
 History and Physical   [name]:[id number] DOB: 1941/09/10 DOA: 10/03/2023  PCP: Antonette Angeline ORN, NP  Outpatient Specialists: Dr. [facility] Patient coming from: home  I have personally briefly reviewed patient's old medical records in [facility] EMR.  Chief Concern: fall  HPI: [name] with hypertension, hyperlipidemia, depression, anxiety, hypothyroid, anemia, who presents ED for chief concerns of falling at [employer] parking lot.  Per ED documentation, patient was still able to get into her car and drove home for help.  EMS came to her home and at that time she was not able to get out of the car.  Vitals in the ED showed temperature of 98.6, respiration rate of 14, heart rate of 87, blood pressure 152/74, SpO2 of 96% on room air.  Serum sodium is 138, potassium 3.4, chloride 104, bicarb 23, BUN of 15, serum creatinine 0.59, EGFR greater than 60, nonfasting blood glucose 99, WBC 11.7, platelets of 130, hemoglobin 15.8.  ED treatment: Fentanyl  50 mcg IV one-time dose. ------------------------------------- Patient was able to tell me her first and last name, age, location, current calendar year.  She reports that she was going to [employer] in order to get her Lipton tea. She states that she had ran out. She then slipped on ice at the grocery store and then somehow got back into the car and drove home. Upon arrival at home, she was no longer able to get out of the car due to the pain. EMS helped her out of the car and brought her to the emergency department.  Ultimately she was not able to obtain her Lipton tea.  Social history: She lives at home with her husband.  She denies tobacco, EtOH, recreational drug use.  She is retired.  ROS: Constitutional: no weight change, no fever ENT/Mouth: no sore throat, no rhinorrhea Eyes: no eye pain, no vision changes Cardiovascular: no chest pain, no dyspnea,  no edema, no  palpitations Respiratory: no cough, no sputum, no wheezing Gastrointestinal: no nausea, no vomiting, no diarrhea, no constipation Genitourinary: no urinary incontinence, no dysuria, no hematuria Musculoskeletal: no arthralgias, no myalgias, + left lower extremity pain Skin: no skin lesions, no pruritus, Neuro: + weakness, no loss of consciousness, no syncope Psych: no anxiety, no depression, no decrease appetite Heme/Lymph: no bruising, no bleeding  ED Course: Discussed with EDP, patient requiring hospitalization for chief concerns of acute nondisplaced left femur fracture.  Assessment/Plan  Principal Problem:   Closed left femoral fracture (HCC) Active Problems:   Hypertension   Anxiety   Psychophysiological insomnia   Acquired hypothyroidism   Hyperlipidemia   RLS (restless legs syndrome)   Overweight with body mass index (BMI) of 26 to 26.9 in adult   Thrombocytopenia (HCC)   Left ankle pain   Assessment and Plan:  * Closed left femoral fracture (HCC) N.p.o. after midnight Symptomatic support: Norco 5-325 mg p.o. every 6 hours as needed for moderate pain, 2 days ordered; morphine  1 mg IV every 4 hours as needed for severe pain, 1 day ordered AM team to continue opioid pain medication as appropriate Orthopedic service has been consulted and is aware Per EDP, plan for orthopedic procedure in the a.m.  Left ankle pain Ankle 2 view x-ray ordered  Hyperlipidemia Home atorvastatin  40 mg nightly resumed  Acquired hypothyroidism Levothyroxine  150 mcg daily every Monday through Saturday; skip Sunday.  This was resumed on admission for first dose on 10/05/2023  Psychophysiological insomnia Home trazodone  50 mg  nightly as needed for sleep resumed  Anxiety Home citalopram  20 mg daily resumed  Hypertension Home amlodipine  10 mg, losartan  100 mg daily, metoprolol  succinate 50 mg daily were resumed on admission Hydralazine  5 mg IV every 6 hours as needed for SBP > 170, 5 days  ordered  Chart reviewed.   DVT prophylaxis: TED hose; AM team to initiate pharmacologic DVT prophylaxis when the benefits outweigh the risk Code Status: Full code Diet: Heart healthy diet; n.p.o. after midnight Family Communication: Updated spouse at bedside with patient's permission Disposition Plan: Pending clinical course; orthopedic evaluation Consults called: Podiatry via EDP; Dr. Edie aware Admission status: Telemetry medical, inpatient  Past Medical History:  Diagnosis Date   Allergy    Anemia    Anxiety    Arthritis    Cancer (HCC)    skin cancer   Hyperlipidemia    Hypertension    Hypothyroidism    Sciatica    Sleep apnea    does not use cpap   Thrombocytopenia (HCC)    Thyroid  disease    Past Surgical History:  Procedure Laterality Date   ABDOMINAL HYSTERECTOMY     APPENDECTOMY     BACK SURGERY     lower   BILATERAL CARPAL TUNNEL RELEASE     BREAST CYST ASPIRATION Left    neg   COLONOSCOPY     COLONOSCOPY WITH PROPOFOL  N/A 04/23/2021   Procedure: COLONOSCOPY WITH PROPOFOL ;  Surgeon: Janalyn Keene NOVAK, MD;  Location: [facility];  Service: Endoscopy;  Laterality: N/A;   ENTROPIAN REPAIR Left 06/08/2020   Procedure: ENTROPION REPAIR, SUTURES ENTROPION REPAIR, EXTENSIVE LEFT;  Surgeon: Ashley Greig HERO, MD;  Location: [facility];  Service: Ophthalmology;  Laterality: Left;   JOINT REPLACEMENT     knees   KNEE SURGERY Bilateral    KNEE SURGERY     OPEN REDUCTION INTERNAL FIXATION (ORIF) DISTAL RADIAL FRACTURE Left 06/23/2019   Procedure: OPEN REDUCTION INTERNAL FIXATION (ORIF) DISTAL RADIAL FRACTURE;  Surgeon: Kathlynn Sharper, MD;  Location: [facility];  Service: Orthopedics;  Laterality: Left;   PARATHYROIDECTOMY  04/27/2020   salivary stone remove     SPINAL CORD STIMULATOR IMPLANT Right    SPINAL CORD STIMULATOR REMOVAL N/A 07/28/2022   Procedure: REMOVAL OF SPINAL CORD STIMULATOR;  Surgeon: Clois Fret, MD;  Location: [facility] OR[facility];  Service:  Neurosurgery;  Laterality: N/A;   THYROIDECTOMY Right 04/27/2020   Procedure: PARATHYROIDECTOMY;  Surgeon: Gladis Cough, MD;  Location: [facility];  Service: General;  Laterality: Right;   TOTAL KNEE REVISION Right 08/13/2015   Procedure: TOTAL KNEE REVISION;  Surgeon: Cough Car, MD;  Location: [facility];  Service: Orthopedics;  Laterality: Right;   Social History:  reports that she has been smoking cigarettes. She has a 20 pack-year smoking history. She has never used smokeless tobacco. She reports that she does not drink alcohol  and does not use drugs.  Allergies  Allergen Reactions   Codeine Nausea Only    Tolerates oxycodone    Morphine  Nausea And Vomiting   Ace Inhibitors Cough   Adhesive [Tape] Other (See Comments) and Rash    whelps   Family History  Problem Relation Age of Onset   Dementia Mother    Dementia Sister    Heart attack Brother    Diabetes Brother    Breast cancer Paternal Aunt    Heart attack Son    Hypercalcemia Neg Hx    Cancer Neg Hx    Family history: Family history reviewed and not  pertinent.  Prior to Admission medications   Medication Sig Start Date End Date Taking? Authorizing Provider  acetaminophen  (TYLENOL ) 500 MG tablet Take 500 mg by mouth every 6 (six) hours as needed for moderate pain.     [provider]  amLODipine  (NORVASC ) 10 MG tablet TAKE 1 TABLET(10 MG) BY MOUTH DAILY 04/10/23   Antonette Angeline ORN, NP  aspirin  EC 81 MG tablet Take 81 mg by mouth daily. Swallow whole.    [provider]  atorvastatin  (LIPITOR) 40 MG tablet TAKE 1 TABLET(40 MG) BY MOUTH DAILY 07/31/23   Antonette Angeline ORN, NP  citalopram  (CELEXA ) 20 MG tablet TAKE 1 TABLET(20 MG) BY MOUTH DAILY 04/10/23   Antonette Angeline ORN, NP  levothyroxine  (SYNTHROID ) 150 MCG tablet TAKE 1 TABLET(150 MCG) BY MOUTH DAILY 04/10/23   Antonette Angeline ORN, NP  losartan  (COZAAR ) 100 MG tablet TAKE 1 TABLET(100 MG) BY MOUTH DAILY 03/17/23   Antonette Angeline ORN, NP  metoprolol  succinate (TOPROL -XL)  50 MG 24 hr tablet TAKE 1 TABLET BY MOUTH EVERY DAY WITH OR IMMEDIATELY FOLLOWING A MEAL 04/10/23   Antonette Angeline ORN, NP  Multiple Vitamin (MULTIVITAMIN) capsule Take 1 capsule by mouth daily.    [provider]  traZODone  (DESYREL ) 50 MG tablet TAKE 1 TABLET(50 MG) BY MOUTH AT BEDTIME AS NEEDED FOR SLEEP 04/10/23   Antonette Angeline ORN, NP   Physical Exam: Vitals:   10/03/23 1612 10/03/23 1615 10/03/23 1630 10/03/23 1700  BP:   (!) 165/135 (!) 152/74  Pulse:   84 87  Resp:   18 14  Temp:   98.6 F (37 C)   TempSrc:   Oral   SpO2: 97%  97% 96%  Weight:  65 kg    Height:  5' 1 (1.549 m)     Constitutional: appears frail, NAD, calm Eyes: PERRL, lids and conjunctivae normal ENMT: Mucous membranes are moist. Posterior pharynx clear of any exudate or lesions. Age-appropriate dentition. Hearing appropriate Neck: normal, supple, no masses, no thyromegaly Respiratory: clear to auscultation bilaterally, no wheezing, no crackles. Normal respiratory effort. No accessory muscle use.  Cardiovascular: Regular rate and rhythm, no murmurs / rubs / gallops. No extremity edema. 2+ pedal pulses. No carotid bruits.  Abdomen: Obese abdomen, no tenderness, no masses palpated, no hepatosplenomegaly. Bowel sounds positive.  Musculoskeletal: no clubbing / cyanosis. No joint deformity upper and lower extremities. Decreased ROM of the left lower extremity, no contractures, no atrophy. Normal muscle tone.  Skin: no rashes, lesions, ulcers. No induration Neurologic: Sensation intact. Strength 5/5 in all 4.  Psychiatric: Normal judgment and insight. Alert and oriented x 3. Normal mood.   EKG: independently reviewed, showing sinus rhythm with rate of 92, QTc 350.  Chest x-ray on Admission: I personally reviewed and I agree with radiologist reading as below.  DG Chest 1 View Result Date: 10/03/2023 CLINICAL DATA:  Fall. EXAM: CHEST  1 VIEW COMPARISON:  Chest x-ray dated Feb 16, 2017. FINDINGS: The heart is at  the upper limits of normal in size. Normal pulmonary vascularity. Negative three No acute osseous abnormality. IMPRESSION: 1. No active disease. Electronically Signed   By: Elsie ONEIDA Shoulder M.D.   On: 10/03/2023 17:01   DG Knee 2 Views Left Result Date: 10/03/2023 CLINICAL DATA:  Left leg pain after fall. EXAM: LEFT KNEE - 1-2 VIEW COMPARISON:  Left knee x-rays dated November 16, 2008. FINDINGS: Prior total knee arthroplasty. No evidence of hardware failure or loosening. No acute fracture or dislocation. No joint  effusion. IMPRESSION: 1. No acute osseous abnormality. Electronically Signed   By: Elsie ONEIDA Shoulder M.D.   On: 10/03/2023 17:00   DG Hip Unilat With Pelvis 2-3 Views Left Result Date: 10/03/2023 CLINICAL DATA:  Left leg pain after fall. EXAM: DG HIP (WITH OR WITHOUT PELVIS) 2-3V LEFT COMPARISON:  Left hip x-rays dated November 27, 2010. FINDINGS: Acute nondisplaced intertrochanteric fracture of the left femur. No dislocation. Osteopenia. Soft tissues are unremarkable. IMPRESSION: 1. Acute nondisplaced intertrochanteric left femur fracture. Electronically Signed   By: Elsie ONEIDA Shoulder M.D.   On: 10/03/2023 17:00   Labs on Admission: I have personally reviewed following labs  CBC: Recent Labs  Lab 09/30/23 0846 10/03/23 1618  WBC 6.3 11.7*  NEUTROABS  --  9.3*  HGB 15.0 15.8*  HCT 46.2* 46.8*  MCV 102.0* 101.7*  PLT 130* 130*   Basic Metabolic Panel: Recent Labs  Lab 09/30/23 0846 10/03/23 1618  NA 142 138  K 4.1 3.4*  CL 106 104  CO2 27 23  GLUCOSE 82 99  BUN 15 15  CREATININE 0.63 0.59  CALCIUM  10.3 9.6   GFR: Estimated Creatinine Clearance: 47.6 mL/min (by C-G formula based on SCr of 0.59 mg/dL).  Liver Function Tests: Recent Labs  Lab 09/30/23 0846  AST 14  ALT 10  BILITOT 0.8  PROT 6.8   Urine analysis:    Component Value Date/Time   COLORURINE AMBER (A) 07/18/2022 0936   APPEARANCEUR CLOUDY (A) 07/18/2022 0936   APPEARANCEUR Cloudy (A) 12/17/2017 1551    LABSPEC 1.020 07/18/2022 0936   PHURINE 5.0 07/18/2022 0936   GLUCOSEU NEGATIVE 07/18/2022 0936   HGBUR NEGATIVE 07/18/2022 0936   BILIRUBINUR NEGATIVE 07/18/2022 0936   BILIRUBINUR Negative 12/01/2019 1156   BILIRUBINUR Negative 12/17/2017 1551   KETONESUR 5 (A) 07/18/2022 0936   PROTEINUR 30 (A) 07/18/2022 0936   UROBILINOGEN 0.2 12/01/2019 1156   UROBILINOGEN 1.0 08/03/2015 1501   NITRITE NEGATIVE 07/18/2022 0936   LEUKOCYTESUR NEGATIVE 07/18/2022 0936   This document was prepared using Dragon Voice Recognition software and may include unintentional dictation errors.  Dr. Sherre [facility]  If 7PM-7AM, please contact overnight-coverage provider If 7AM-7PM, please contact day attending provider [email or web address]  10/03/2023, 6:21 PM

## 2023-10-03 NOTE — ED Notes (Signed)
 Ice pack placed on left leg/hip to help with pain.

## 2023-10-03 NOTE — Assessment & Plan Note (Signed)
 Home amlodipine 10 mg, losartan 100 mg daily, metoprolol succinate 50 mg daily were resumed on admission Hydralazine 5 mg IV every 6 hours as needed for SBP > 170, 5 days ordered

## 2023-10-03 NOTE — Assessment & Plan Note (Signed)
 Ankle 2 view x-ray ordered

## 2023-10-03 NOTE — ED Notes (Signed)
Dr. Cox at bedside.  

## 2023-10-03 NOTE — Assessment & Plan Note (Signed)
Home atorvastatin 40 mg nightly resumed

## 2023-10-04 ENCOUNTER — Encounter
Admission: EM | Disposition: A | Discharge status: Skilled Nursing Facility | Payer: Self-pay | Source: Home / Self Care | Attending: Internal Medicine

## 2023-10-04 ENCOUNTER — Other Ambulatory Visit: Payer: Self-pay

## 2023-10-04 ENCOUNTER — Inpatient Hospital Stay: Payer: Self-pay | Admitting: Anesthesiology

## 2023-10-04 ENCOUNTER — Inpatient Hospital Stay: Payer: PPO

## 2023-10-04 DIAGNOSIS — S72145A Nondisplaced intertrochanteric fracture of left femur, initial encounter for closed fracture: Secondary | ICD-10-CM | POA: Diagnosis not present

## 2023-10-04 DIAGNOSIS — E039 Hypothyroidism, unspecified: Secondary | ICD-10-CM | POA: Diagnosis not present

## 2023-10-04 DIAGNOSIS — Z01818 Encounter for other preprocedural examination: Secondary | ICD-10-CM

## 2023-10-04 DIAGNOSIS — S72002A Fracture of unspecified part of neck of left femur, initial encounter for closed fracture: Secondary | ICD-10-CM | POA: Diagnosis not present

## 2023-10-04 DIAGNOSIS — I10 Essential (primary) hypertension: Secondary | ICD-10-CM

## 2023-10-04 DIAGNOSIS — F17210 Nicotine dependence, cigarettes, uncomplicated: Secondary | ICD-10-CM | POA: Diagnosis not present

## 2023-10-04 HISTORY — PX: INTRAMEDULLARY (IM) NAIL INTERTROCHANTERIC: SHX5875

## 2023-10-04 LAB — BASIC METABOLIC PANEL
Anion gap: 11 (ref 5–15)
BUN: 12 mg/dL (ref 8–23)
CO2: 23 mmol/L (ref 22–32)
Calcium: 9.3 mg/dL (ref 8.9–10.3)
Chloride: 103 mmol/L (ref 98–111)
Creatinine, Ser: 0.47 mg/dL (ref 0.44–1.00)
GFR, Estimated: >60 mL/min (ref >60)
Glucose, Bld: 88 mg/dL (ref 70–99)
Potassium: 3.1 mmol/L — ABNORMAL LOW (ref 3.5–5.1)
Sodium: 137 mmol/L (ref 135–145)

## 2023-10-04 LAB — SURGICAL PCR SCREEN
MRSA, PCR: NEGATIVE
Staphylococcus aureus: NEGATIVE

## 2023-10-04 LAB — CBC
HCT: 38.5 % (ref 36.0–46.0)
Hemoglobin: 13.4 g/dL (ref 12.0–15.0)
MCH: 34.2 pg — ABNORMAL HIGH (ref 26.0–34.0)
MCHC: 34.8 g/dL (ref 30.0–36.0)
MCV: 98.2 fL (ref 80.0–100.0)
Platelets: 100 10*3/uL — ABNORMAL LOW (ref 150–400)
RBC: 3.92 MIL/uL (ref 3.87–5.11)
RDW: 12.4 % (ref 11.5–15.5)
WBC: 8 10*3/uL (ref 4.0–10.5)
nRBC: 0 % (ref 0.0–0.2)

## 2023-10-04 LAB — MAGNESIUM: Magnesium: 2.1 mg/dL (ref 1.7–2.4)

## 2023-10-04 SURGERY — FIXATION, FRACTURE, INTERTROCHANTERIC, WITH INTRAMEDULLARY ROD
Anesthesia: General | Site: Hip | Laterality: Left

## 2023-10-04 MED ORDER — METOCLOPRAMIDE HCL 5 MG/ML IJ SOLN: 5.0000 mg | Freq: Three times a day (TID) | INTRAMUSCULAR | Status: DC | PRN

## 2023-10-04 MED ORDER — METOPROLOL TARTRATE 5 MG/5ML IV SOLN
INTRAVENOUS | Status: DC | PRN
  Administered 2023-10-04: 1 mg via INTRAVENOUS

## 2023-10-04 MED ORDER — SUGAMMADEX SODIUM 200 MG/2ML IV SOLN
INTRAVENOUS | Status: DC | PRN
  Administered 2023-10-04: 130 mg via INTRAVENOUS

## 2023-10-04 MED ORDER — FLEET ENEMA RE ENEM: 1.0000 | ENEMA | Freq: Once | RECTAL | Status: DC | PRN

## 2023-10-04 MED ORDER — POTASSIUM CHLORIDE CRYS ER 20 MEQ PO TBCR
40.0000 meq | EXTENDED_RELEASE_TABLET | Freq: Once | ORAL | Status: AC
  Administered 2023-10-04: 40 meq via ORAL
  Filled 2023-10-04: qty 2

## 2023-10-04 MED ORDER — METOCLOPRAMIDE HCL 10 MG PO TABS: 5.0000 mg | ORAL_TABLET | Freq: Three times a day (TID) | ORAL | Status: DC | PRN

## 2023-10-04 MED ORDER — EPHEDRINE 5 MG/ML INJ
INTRAVENOUS | Status: AC
  Filled 2023-10-04: qty 5

## 2023-10-04 MED ORDER — ONDANSETRON HCL 4 MG/2ML IJ SOLN: 4.0000 mg | Freq: Four times a day (QID) | INTRAMUSCULAR | Status: DC | PRN

## 2023-10-04 MED ORDER — DEXTROSE-SODIUM CHLORIDE 5-0.9 % IV SOLN: INTRAVENOUS | Status: AC

## 2023-10-04 MED ORDER — DOCUSATE SODIUM 100 MG PO CAPS
100.0000 mg | ORAL_CAPSULE | Freq: Two times a day (BID) | ORAL | Status: DC
  Administered 2023-10-04 – 2023-10-14 (×18): 100 mg via ORAL
  Filled 2023-10-04 (×19): qty 1

## 2023-10-04 MED ORDER — AMIODARONE HCL IN DEXTROSE 360-4.14 MG/200ML-% IV SOLN
60.0000 mg/h | INTRAVENOUS | Status: AC
  Administered 2023-10-04: 60 mg/h via INTRAVENOUS
  Filled 2023-10-04 (×2): qty 200

## 2023-10-04 MED ORDER — BISACODYL 10 MG RE SUPP: 10.0000 mg | Freq: Every day | RECTAL | Status: DC | PRN

## 2023-10-04 MED ORDER — ONDANSETRON HCL 4 MG/2ML IJ SOLN
INTRAMUSCULAR | Status: AC
  Filled 2023-10-04: qty 2

## 2023-10-04 MED ORDER — MAGNESIUM HYDROXIDE 400 MG/5ML PO SUSP: 30.0000 mL | Freq: Every day | ORAL | Status: DC | PRN

## 2023-10-04 MED ORDER — METOPROLOL TARTRATE 5 MG/5ML IV SOLN
INTRAVENOUS | Status: AC
  Filled 2023-10-04: qty 5

## 2023-10-04 MED ORDER — ENOXAPARIN SODIUM 40 MG/0.4ML IJ SOSY
40.0000 mg | PREFILLED_SYRINGE | INTRAMUSCULAR | Status: DC
  Administered 2023-10-05: 40 mg via SUBCUTANEOUS
  Filled 2023-10-04: qty 0.4

## 2023-10-04 MED ORDER — ROCURONIUM BROMIDE 100 MG/10ML IV SOLN
INTRAVENOUS | Status: DC | PRN
  Administered 2023-10-04: 40 mg via INTRAVENOUS
  Administered 2023-10-04: 20 mg via INTRAVENOUS
  Administered 2023-10-04: 10 mg via INTRAVENOUS

## 2023-10-04 MED ORDER — FENTANYL CITRATE (PF) 100 MCG/2ML IJ SOLN: 25.0000 ug | INTRAMUSCULAR | Status: DC | PRN

## 2023-10-04 MED ORDER — 0.9 % SODIUM CHLORIDE (POUR BTL) OPTIME
TOPICAL | Status: DC | PRN
  Administered 2023-10-04: 1000 mL

## 2023-10-04 MED ORDER — GLYCOPYRROLATE 0.2 MG/ML IJ SOLN
INTRAMUSCULAR | Status: AC
  Filled 2023-10-04: qty 1

## 2023-10-04 MED ORDER — METOPROLOL TARTRATE 5 MG/5ML IV SOLN: 2.5000 mg | Freq: Four times a day (QID) | INTRAVENOUS | Status: DC | PRN

## 2023-10-04 MED ORDER — MUPIROCIN 2 % EX OINT
1.0000 | TOPICAL_OINTMENT | Freq: Two times a day (BID) | CUTANEOUS | Status: DC
  Filled 2023-10-04: qty 22

## 2023-10-04 MED ORDER — LACTATED RINGERS IV SOLN: INTRAVENOUS | Status: DC | PRN

## 2023-10-04 MED ORDER — SODIUM CHLORIDE 0.9 % IV BOLUS
500.0000 mL | Freq: Once | INTRAVENOUS | Status: AC
  Administered 2023-10-04: 500 mL via INTRAVENOUS

## 2023-10-04 MED ORDER — POTASSIUM CHLORIDE 10 MEQ/100ML IV SOLN
10.0000 meq | INTRAVENOUS | Status: AC
  Administered 2023-10-05: 10 meq via INTRAVENOUS

## 2023-10-04 MED ORDER — ONDANSETRON HCL 4 MG/2ML IJ SOLN
INTRAMUSCULAR | Status: DC | PRN
  Administered 2023-10-04: 4 mg via INTRAVENOUS

## 2023-10-04 MED ORDER — DEXAMETHASONE SODIUM PHOSPHATE 10 MG/ML IJ SOLN
INTRAMUSCULAR | Status: AC
  Filled 2023-10-04: qty 1

## 2023-10-04 MED ORDER — CEFAZOLIN SODIUM-DEXTROSE 2-4 GM/100ML-% IV SOLN
INTRAVENOUS | Status: AC
  Filled 2023-10-04: qty 100

## 2023-10-04 MED ORDER — AMIODARONE HCL IN DEXTROSE 360-4.14 MG/200ML-% IV SOLN
60.0000 mg/h | INTRAVENOUS | Status: DC
  Administered 2023-10-04 – 2023-10-07 (×5): 30 mg/h via INTRAVENOUS
  Administered 2023-10-07 – 2023-10-09 (×5): 60 mg/h via INTRAVENOUS
  Filled 2023-10-04 (×13): qty 200

## 2023-10-04 MED ORDER — LIDOCAINE HCL (CARDIAC) PF 100 MG/5ML IV SOSY
PREFILLED_SYRINGE | INTRAVENOUS | Status: DC | PRN
  Administered 2023-10-04: 60 mg via INTRAVENOUS

## 2023-10-04 MED ORDER — AMIODARONE LOAD VIA INFUSION
150.0000 mg | Freq: Once | INTRAVENOUS | Status: AC
  Administered 2023-10-04: 150 mg via INTRAVENOUS
  Filled 2023-10-04: qty 83.34

## 2023-10-04 MED ORDER — PHENYLEPHRINE HCL-NACL 20-0.9 MG/250ML-% IV SOLN
INTRAVENOUS | Status: AC
  Filled 2023-10-04: qty 250

## 2023-10-04 MED ORDER — BUPIVACAINE LIPOSOME 1.3 % IJ SUSP
INTRAMUSCULAR | Status: AC
  Filled 2023-10-04: qty 10

## 2023-10-04 MED ORDER — EPHEDRINE SULFATE-NACL 50-0.9 MG/10ML-% IV SOSY
PREFILLED_SYRINGE | INTRAVENOUS | Status: DC | PRN
Start: 2023-10-04 — End: 2023-10-04
  Administered 2023-10-04 (×2): 5 mg via INTRAVENOUS

## 2023-10-04 MED ORDER — PROPOFOL 10 MG/ML IV BOLUS
INTRAVENOUS | Status: DC | PRN
  Administered 2023-10-04: 90 mg via INTRAVENOUS

## 2023-10-04 MED ORDER — FENTANYL CITRATE (PF) 100 MCG/2ML IJ SOLN
INTRAMUSCULAR | Status: AC
  Filled 2023-10-04: qty 2

## 2023-10-04 MED ORDER — PROPOFOL 10 MG/ML IV BOLUS
INTRAVENOUS | Status: AC
  Filled 2023-10-04: qty 20

## 2023-10-04 MED ORDER — DIPHENHYDRAMINE HCL 12.5 MG/5ML PO ELIX
12.5000 mg | ORAL_SOLUTION | ORAL | Status: DC | PRN
  Administered 2023-10-05: 25 mg via ORAL
  Filled 2023-10-04 (×2): qty 10

## 2023-10-04 MED ORDER — ONDANSETRON HCL 4 MG PO TABS: 4.0000 mg | ORAL_TABLET | Freq: Four times a day (QID) | ORAL | Status: DC | PRN

## 2023-10-04 MED ORDER — BUPIVACAINE LIPOSOME 1.3 % IJ SUSP
INTRAMUSCULAR | Status: DC | PRN
  Administered 2023-10-04: 50 mL via SURGICAL_CAVITY

## 2023-10-04 MED ORDER — POTASSIUM CHLORIDE 10 MEQ/100ML IV SOLN
10.0000 meq | INTRAVENOUS | Status: AC
  Administered 2023-10-04 (×3): 10 meq via INTRAVENOUS
  Filled 2023-10-04 (×4): qty 100

## 2023-10-04 MED ORDER — CEFAZOLIN SODIUM-DEXTROSE 2-4 GM/100ML-% IV SOLN
2.0000 g | Freq: Four times a day (QID) | INTRAVENOUS | Status: AC
  Administered 2023-10-04 (×2): 2 g via INTRAVENOUS
  Filled 2023-10-04 (×2): qty 100

## 2023-10-04 MED ORDER — BUPIVACAINE-EPINEPHRINE (PF) 0.5% -1:200000 IJ SOLN: INTRAMUSCULAR | Status: DC | PRN

## 2023-10-04 MED ORDER — POLYETHYLENE GLYCOL 3350 17 G PO PACK
17.0000 g | PACK | Freq: Every day | ORAL | Status: DC
Start: 2023-10-05 — End: 2023-10-15
  Administered 2023-10-05 – 2023-10-13 (×7): 17 g via ORAL
  Filled 2023-10-04 (×8): qty 1

## 2023-10-04 MED ORDER — BUPIVACAINE HCL (PF) 0.5 % IJ SOLN
INTRAMUSCULAR | Status: AC
Start: 2023-10-04 — End: ?
  Filled 2023-10-04: qty 30

## 2023-10-04 MED ORDER — ROCURONIUM BROMIDE 10 MG/ML (PF) SYRINGE
PREFILLED_SYRINGE | INTRAVENOUS | Status: AC
  Filled 2023-10-04: qty 10

## 2023-10-04 MED ORDER — HYDROCODONE-ACETAMINOPHEN 5-325 MG PO TABS
1.0000 | ORAL_TABLET | Freq: Four times a day (QID) | ORAL | Status: DC | PRN
  Administered 2023-10-04 – 2023-10-15 (×8): 1 via ORAL
  Filled 2023-10-04 (×8): qty 1

## 2023-10-04 MED ORDER — FENTANYL CITRATE (PF) 100 MCG/2ML IJ SOLN
INTRAMUSCULAR | Status: DC | PRN
  Administered 2023-10-04 (×4): 25 ug via INTRAVENOUS

## 2023-10-04 MED ORDER — ACETAMINOPHEN 500 MG PO TABS
500.0000 mg | ORAL_TABLET | Freq: Four times a day (QID) | ORAL | Status: AC
  Administered 2023-10-04 – 2023-10-05 (×3): 500 mg via ORAL
  Filled 2023-10-04 (×3): qty 1

## 2023-10-04 MED ORDER — DEXAMETHASONE SODIUM PHOSPHATE 10 MG/ML IJ SOLN
INTRAMUSCULAR | Status: DC | PRN
  Administered 2023-10-04: 5 mg via INTRAVENOUS

## 2023-10-04 MED ORDER — METOPROLOL TARTRATE 5 MG/5ML IV SOLN
5.0000 mg | Freq: Once | INTRAVENOUS | Status: AC
  Administered 2023-10-04: 5 mg via INTRAVENOUS

## 2023-10-04 MED ORDER — KETOROLAC TROMETHAMINE 15 MG/ML IJ SOLN
INTRAMUSCULAR | Status: AC
  Filled 2023-10-04: qty 1

## 2023-10-04 MED ORDER — BUPIVACAINE-EPINEPHRINE (PF) 0.5% -1:200000 IJ SOLN
INTRAMUSCULAR | Status: AC
  Filled 2023-10-04: qty 30

## 2023-10-04 MED ORDER — PHENYLEPHRINE HCL-NACL 20-0.9 MG/250ML-% IV SOLN
INTRAVENOUS | Status: DC | PRN
  Administered 2023-10-04: 35 ug/min via INTRAVENOUS

## 2023-10-04 MED ORDER — SENNOSIDES-DOCUSATE SODIUM 8.6-50 MG PO TABS
2.0000 | ORAL_TABLET | Freq: Every day | ORAL | Status: DC
Start: 2023-10-04 — End: 2023-10-15
  Administered 2023-10-04 – 2023-10-14 (×11): 2 via ORAL
  Filled 2023-10-04 (×11): qty 2

## 2023-10-04 MED ORDER — ACETAMINOPHEN 325 MG PO TABS: 325.0000 mg | ORAL_TABLET | Freq: Four times a day (QID) | ORAL | Status: DC | PRN

## 2023-10-04 MED ORDER — HYDROMORPHONE HCL 1 MG/ML IJ SOLN
0.5000 mg | INTRAMUSCULAR | Status: DC | PRN
  Administered 2023-10-10 – 2023-10-13 (×4): 0.5 mg via INTRAVENOUS
  Filled 2023-10-04 (×4): qty 0.5

## 2023-10-04 SURGICAL SUPPLY — 46 items
BIT DRILL 4.3MMS DISTAL GRDTED (BIT) IMPLANT
BIT DRILL CANN LG 4.3MM (BIT) IMPLANT
BNDG COHESIVE 4X5 TAN STRL LF (GAUZE/BANDAGES/DRESSINGS) ×1 IMPLANT
BNDG COHESIVE 6X5 TAN ST LF (GAUZE/BANDAGES/DRESSINGS) ×1 IMPLANT
CHLORAPREP W/TINT 26 (MISCELLANEOUS) ×2 IMPLANT
DRAPE C-ARMOR (DRAPES) ×1 IMPLANT
DRAPE SHEET LG 3/4 BI-LAMINATE (DRAPES) ×1 IMPLANT
DRILL 4.3MMS DISTAL GRADUATED (BIT) ×1
DRILL BIT CANN LG 4.3MM (BIT) ×1
DRSG MEPILEX SACRM 8.7X9.8 (GAUZE/BANDAGES/DRESSINGS) ×1 IMPLANT
DRSG OPSITE POSTOP 3X4 (GAUZE/BANDAGES/DRESSINGS) IMPLANT
DRSG OPSITE POSTOP 4X6 (GAUZE/BANDAGES/DRESSINGS) IMPLANT
ELECT CAUTERY BLADE 6.4 (BLADE) ×1 IMPLANT
ELECT REM PT RETURN 9FT ADLT (ELECTROSURGICAL) ×1
ELECTRODE REM PT RTRN 9FT ADLT (ELECTROSURGICAL) ×1 IMPLANT
GAUZE SPONGE 4X4 12PLY STRL (GAUZE/BANDAGES/DRESSINGS) ×1 IMPLANT
GLOVE BIO SURGEON STRL SZ8 (GLOVE) ×2 IMPLANT
GLOVE INDICATOR 8.0 STRL GRN (GLOVE) ×1 IMPLANT
GOWN STRL REUS W/ TWL LRG LVL3 (GOWN DISPOSABLE) ×1 IMPLANT
GOWN STRL REUS W/ TWL XL LVL3 (GOWN DISPOSABLE) ×1 IMPLANT
GUIDEPIN VERSANAIL DSP 3.2X444 (ORTHOPEDIC DISPOSABLE SUPPLIES) IMPLANT
GUIDEWIRE BALL NOSE 100CM (WIRE) IMPLANT
GUIDEWIRE BALL NOSE 80CM (WIRE) IMPLANT
HANDLE YANKAUER SUCT OPEN TIP (MISCELLANEOUS) ×1 IMPLANT
HFN LAG SCREW 10.5MM X 85MM (Screw) IMPLANT
HFN LH 130 DEG 9MM X 360MM (Nail) IMPLANT
MANIFOLD NEPTUNE II (INSTRUMENTS) ×1 IMPLANT
MAT ABSORB FLUID 56X50 GRAY (MISCELLANEOUS) ×1 IMPLANT
NDL FILTER BLUNT 18X1 1/2 (NEEDLE) ×1 IMPLANT
NDL HYPO 22X1.5 SAFETY MO (MISCELLANEOUS) ×1 IMPLANT
NEEDLE FILTER BLUNT 18X1 1/2 (NEEDLE) ×1 IMPLANT
NEEDLE HYPO 22X1.5 SAFETY MO (MISCELLANEOUS) ×1 IMPLANT
NS IRRIG 500ML POUR BTL (IV SOLUTION) ×1 IMPLANT
PACK HIP COMPR (MISCELLANEOUS) ×1 IMPLANT
PENCIL SMOKE EVACUATOR COATED (MISCELLANEOUS) IMPLANT
SCREW BONE CORTICAL 5.0X40 (Screw) IMPLANT
STAPLER SKIN PROX 35W (STAPLE) ×1 IMPLANT
STRAP SAFETY 5IN WIDE (MISCELLANEOUS) ×1 IMPLANT
SUT VIC AB 0 CT1 36 (SUTURE) ×1 IMPLANT
SUT VIC AB 1 CT1 36 (SUTURE) ×1 IMPLANT
SUT VIC AB 2-0 CT1 (SUTURE) ×2 IMPLANT
SYR 10ML LL (SYRINGE) ×1 IMPLANT
SYR 30ML LL (SYRINGE) ×1 IMPLANT
TAPE MICROFOAM 4IN (TAPE) ×1 IMPLANT
TRAP FLUID SMOKE EVACUATOR (MISCELLANEOUS) ×1 IMPLANT
WATER STERILE IRR 500ML POUR (IV SOLUTION) ×1 IMPLANT

## 2023-10-04 NOTE — Progress Notes (Signed)
 Pt is alert and oriented. Tx to surgery via bed with surg tech.

## 2023-10-04 NOTE — Progress Notes (Signed)
 PACU note: HR remains 120's-140's and irregular. 12 lead done. Reading Afib.  Anesthesia aware. To pt's knowledge, not hx of afib.  5mg  Metoprolol given per order. Awaiting cards consult or next new orders.

## 2023-10-04 NOTE — Anesthesia Postprocedure Evaluation (Addendum)
 Anesthesia Post Note  Patient: [name]  Procedure(s) Performed: INTRAMEDULLARY (IM) NAIL INTERTROCHANTERIC (Left: Hip)  Patient location during evaluation: PACU Anesthesia Type: General Level of consciousness: awake and alert Pain management: pain level controlled Vital Signs Assessment: post-procedure vital signs reviewed and stable Respiratory status: spontaneous breathing, nonlabored ventilation, respiratory function stable and patient connected to nasal cannula oxygen  Cardiovascular status: blood pressure returned to baseline and stable Postop Assessment: no apparent nausea or vomiting Anesthetic complications: no Comments: New onset atrial fibrillation in PACU. Medicine team in consulting cardiology for further management.   No notable events documented.   Last Vitals:  Vitals:   10/04/23 1400 10/04/23 1430  BP: 92/65 96/71  Pulse: (!) 126 76  Resp: 13 13  Temp:    SpO2: 99% 97%    Last Pain:  Vitals:   10/04/23 1430  TempSrc:   PainSc: 0-No pain                 Fairy POUR [name]

## 2023-10-04 NOTE — Anesthesia Preprocedure Evaluation (Signed)
 Anesthesia Evaluation  Patient identified by MRN, date of birth, ID band Patient confused    Reviewed: Allergy & Precautions, NPO status , Patient's Chart, lab work & pertinent test results  Airway Mallampati: III  TM Distance: <3 FB Neck ROM: full    Dental  (+) Upper Dentures, Lower Dentures   Pulmonary sleep apnea , COPD, Current Smoker   Pulmonary exam normal        Cardiovascular Exercise Tolerance: Good hypertension, (-) angina Normal cardiovascular exam     Neuro/Psych  PSYCHIATRIC DISORDERS       Neuromuscular disease    GI/Hepatic negative GI ROS, Neg liver ROS,,,  Endo/Other  Hypothyroidism    Renal/GU      Musculoskeletal   Abdominal   Peds  Hematology negative hematology ROS (+)   Anesthesia Other Findings Past Medical History: No date: Allergy No date: Anemia No date: Anxiety No date: Arthritis No date: Cancer (HCC)     Comment:  skin cancer No date: Hyperlipidemia No date: Hypertension No date: Hypothyroidism No date: Sciatica No date: Sleep apnea     Comment:  does not use cpap No date: Thrombocytopenia (HCC) No date: Thyroid  disease  Past Surgical History: No date: ABDOMINAL HYSTERECTOMY No date: APPENDECTOMY No date: BACK SURGERY     Comment:  lower No date: BILATERAL CARPAL TUNNEL RELEASE No date: BREAST CYST ASPIRATION; Left     Comment:  neg No date: COLONOSCOPY 04/23/2021: COLONOSCOPY WITH PROPOFOL ; N/A     Comment:  Procedure: COLONOSCOPY WITH PROPOFOL ;  Surgeon:               Janalyn Keene NOVAK, MD;  Location: [facility];                Service: Endoscopy;  Laterality: N/A; 06/08/2020: ENTROPIAN REPAIR; Left     Comment:  Procedure: ENTROPION REPAIR, SUTURES ENTROPION REPAIR,               EXTENSIVE LEFT;  Surgeon: Ashley Greig HERO, MD;  Location:               [facility];  Service: Ophthalmology;                Laterality: Left; No date: JOINT REPLACEMENT      Comment:  knees No date: KNEE SURGERY; Bilateral No date: KNEE SURGERY 06/23/2019: OPEN REDUCTION INTERNAL FIXATION (ORIF) DISTAL RADIAL  FRACTURE; Left     Comment:  Procedure: OPEN REDUCTION INTERNAL FIXATION (ORIF)               DISTAL RADIAL FRACTURE;  Surgeon: Kathlynn Sharper, MD;                Location: [facility];  Service: Orthopedics;  Laterality:               Left; 04/27/2020: PARATHYROIDECTOMY No date: salivary stone remove No date: SPINAL CORD STIMULATOR IMPLANT; Right 07/28/2022: SPINAL CORD STIMULATOR REMOVAL; N/A     Comment:  Procedure: REMOVAL OF SPINAL CORD STIMULATOR;  Surgeon:               Clois Fret, MD;  Location: [facility];  Service:               Neurosurgery;  Laterality: N/A; 04/27/2020: THYROIDECTOMY; Right     Comment:  Procedure: PARATHYROIDECTOMY;  Surgeon: Gladis Cough,              MD;  Location: [facility];  Service: General;  Laterality:  Right; 08/13/2015: TOTAL KNEE REVISION; Right     Comment:  Procedure: TOTAL KNEE REVISION;  Surgeon: Donnice Car,               MD;  Location: [facility];  Service: Orthopedics;                Laterality: Right;  BMI    Body Mass Index: 27.08 kg/m      Reproductive/Obstetrics negative OB ROS                             Anesthesia Physical Anesthesia Plan  ASA: 3  Anesthesia Plan: General ETT   Post-op Pain Management:    Induction: Intravenous  PONV Risk Score and Plan: Ondansetron , Dexamethasone , Midazolam  and Treatment may vary due to age or medical condition  Airway Management Planned: Oral ETT  Additional Equipment:   Intra-op Plan:   Post-operative Plan: Extubation in OR  Informed Consent: I have reviewed the patients History and Physical, chart, labs and discussed the procedure including the risks, benefits and alternatives for the proposed anesthesia with the patient or authorized representative who has indicated his/her understanding and acceptance.      Dental Advisory Given  Plan Discussed with: Anesthesiologist, CRNA and Surgeon  Anesthesia Plan Comments: (Husband [phone]) consented for risks of anesthesia including but not limited to:  - adverse reactions to medications - damage to eyes, teeth, lips or other oral mucosa - nerve damage due to positioning  - sore throat or hoarseness - Damage to heart, brain, nerves, lungs, other parts of body or loss of life  He voiced understanding and assent.)       Anesthesia Quick Evaluation

## 2023-10-04 NOTE — Progress Notes (Addendum)
 [facility] PROGRESS NOTE   JANALYN HIGBY FMW:[id number] DOB: 01/28/1941 DOA: 10/03/2023  PCP: Antonette Angeline ORN, NP  Brief History: 83 year old Caucasian female with past medical history of essential hypertension, hyperlipidemia, depression, anxiety, hypothyroidism and anemia presents after a mechanical fall while she was in [employer] parking lot.  She was however able to get into her car and was able to drive home but could not get out of the car at home.  EMS was called.  She was brought into the emergency department.  Found to have left hip fracture.  She was hospitalized for further management.  Consultants: Orthopedics  Procedures: None yet    Subjective/Interval History: Patient denies any pain while she is lying still.  Denies any shortness of breath or chest pains.  No history of heart disease.    Assessment/Plan: ADDENDUM Patient went into Afib with RVR in the PACU after her surgery. She will be given Iv metoprolol . May need cardizem infusion. Patient seen at bedside. Seems mildly confused likely due to anesthesia. Does not appear to be in any distress. Lungs are CTA. Tele shows afib at 120-130's. [facility] cardiology consulted. BP soft after 5mg  iv metoprolol . Asymptomatic. Will start amiodarone . Cannot give anticoagulation due to recent surgery. Can be considered in AM if cleared to do so by orthopedist.  Left hip fracture Management per orthopedics.  Plan is for surgery later today. EKG reviewed.  Nonspecific ST changes seen previously as well.  She does not have any known history of heart disease.  No murmurs appreciated on examination. Based on the Cliff Perioperative Risk Index the patient's estimated risk probability for perioperative myocardial infarction or cardiac arrest is 0.21%.  Patient's procedure is intermediate risk. Patient's functional capacity is moderate to good. Based on the AHA/ACC algorithms patient may proceed to surgery without further testing.   Continue with beta-blockers. PT and OT evaluation postoperatively.  Pain control.  Will need bowel regimen.  Left ankle pain Imaging studies did not show any injuries/fractures.  Hyperlipidemia Continue with statin.  Hypothyroidism Continue with levothyroxine .  Essential hypertension Noted to be on amlodipine  losartan  and metoprolol  prior to admission.  These have been resumed.  Monitor blood pressures closely.  Hypokalemia Will be supplemented.  Thrombocytopenia This appears to be chronic based on previous labs.  DVT Prophylaxis: Definitive prophylaxis after surgery and per orthopedics recommendation Code Status: Full code Family Communication: Discussed with patient.  No family at bedside Disposition Plan: To be determined  Status is: Inpatient Remains inpatient appropriate because: Left hip fracture      Medications: Scheduled:  amLODipine   10 mg Oral Daily   atorvastatin   40 mg Oral QHS   citalopram   20 mg Oral Daily   [START ON 10/05/2023] levothyroxine   150 mcg Oral Once per day on Monday Tuesday Wednesday Thursday Friday Saturday   losartan   100 mg Oral Daily   metoprolol  succinate  50 mg Oral Daily   Continuous:   ceFAZolin  (ANCEF ) IV     potassium chloride      promethazine  (PHENERGAN ) injection (IM or IVPB)     PRN:hydrALAZINE , HYDROcodone -acetaminophen , HYDROmorphone  (DILAUDID ) injection, ondansetron  (ZOFRAN ) IV, promethazine  (PHENERGAN ) injection (IM or IVPB), senna-docusate, traZODone   Antibiotics: Anti-infectives (From admission, onward)    Start     Dose/Rate Route Frequency Ordered Stop   10/04/23 0000  ceFAZolin  (ANCEF ) IVPB 2g/100 mL premix        2 g 200 mL/hr over 30 Minutes Intravenous 30 min pre-op 10/03/23 1905  Objective:  Vital Signs  Vitals:   10/03/23 2300 10/03/23 2330 10/04/23 0019 10/04/23 0908  BP: (!) 122/58 (!) 140/65 (!) 158/85 (!) 154/65  Pulse: 77 81 79 76  Resp: 11 17 18 17   Temp:  98.7 F (37.1 C) 97.8 F  (36.6 C) 99.1 F (37.3 C)  TempSrc:  Oral Oral Oral  SpO2: 93% 93% 97% 94%  Weight:      Height:       No intake or output data in the 24 hours ending 10/04/23 0924 Filed Weights   10/03/23 1615  Weight: 65 kg    General appearance: Awake alert.  In no distress Resp: Clear to auscultation bilaterally.  Normal effort Cardio: S1-S2 is normal regular.  No S3-S4.  No rubs murmurs or bruit GI: Abdomen is soft.  Nontender nondistended.  Bowel sounds are present normal.  No masses organomegaly Extremities: No edema.  Left lower extremity is externally rotated Neurologic: Alert and oriented x3.  No focal neurological deficits.    Lab Results:  Data Reviewed: I have personally reviewed following labs and reports of the imaging studies  CBC: Recent Labs  Lab 09/30/23 0846 10/03/23 1618 10/04/23 0411  WBC 6.3 11.7* 8.0  NEUTROABS  --  9.3*  --   HGB 15.0 15.8* 13.4  HCT 46.2* 46.8* 38.5  MCV 102.0* 101.7* 98.2  PLT 130* 130* 100*    Basic Metabolic Panel: Recent Labs  Lab 09/30/23 0846 10/03/23 1618 10/04/23 0411  NA 142 138 137  K 4.1 3.4* 3.1*  CL 106 104 103  CO2 27 23 23   GLUCOSE 82 99 88  BUN 15 15 12   CREATININE 0.63 0.59 0.47  CALCIUM  10.3 9.6 9.3    GFR: Estimated Creatinine Clearance: 47.6 mL/min (by C-G formula based on SCr of 0.47 mg/dL).  Liver Function Tests: Recent Labs  Lab 09/30/23 0846  AST 14  ALT 10  BILITOT 0.8  PROT 6.8      Recent Results (from the past 240 hours)  Surgical PCR screen     Status: None   Collection Time: 10/04/23 12:23 AM   Specimen: Nasal Mucosa; Nasal Swab  Result Value Ref Range Status   MRSA, PCR NEGATIVE NEGATIVE Final   Staphylococcus aureus NEGATIVE NEGATIVE Final    Comment: (NOTE) The Xpert SA Assay (FDA approved for NASAL specimens in patients 27 years of age and older), is one component of a comprehensive surveillance program. It is not intended to diagnose infection nor to guide or monitor  treatment. Performed at [facility], [address], [place], [place] [address]       Radiology Studies: DG Ankle 2 Views Left Result Date: 10/03/2023 CLINICAL DATA:  Left ankle pain after a fall. EXAM: LEFT ANKLE - 2 VIEW COMPARISON:  None Available. FINDINGS: The left ankle is located. Soft tissue swelling is present at the medial malleolus. A joint effusion is present. No underlying fracture is present. IMPRESSION: 1. Soft tissue swelling at the medial malleolus without underlying fracture. 2. Joint effusion. Electronically Signed   By: Lonni Necessary M.D.   On: 10/03/2023 18:55   DG Chest 1 View Result Date: 10/03/2023 CLINICAL DATA:  Fall. EXAM: CHEST  1 VIEW COMPARISON:  Chest x-ray dated Feb 16, 2017. FINDINGS: The heart is at the upper limits of normal in size. Normal pulmonary vascularity. Negative three No acute osseous abnormality. IMPRESSION: 1. No active disease. Electronically Signed   By: Elsie ONEIDA Shoulder M.D.   On: 10/03/2023  17:01   DG Knee 2 Views Left Result Date: 10/03/2023 CLINICAL DATA:  Left leg pain after fall. EXAM: LEFT KNEE - 1-2 VIEW COMPARISON:  Left knee x-rays dated November 16, 2008. FINDINGS: Prior total knee arthroplasty. No evidence of hardware failure or loosening. No acute fracture or dislocation. No joint effusion. IMPRESSION: 1. No acute osseous abnormality. Electronically Signed   By: Elsie ONEIDA Shoulder M.D.   On: 10/03/2023 17:00   DG Hip Unilat With Pelvis 2-3 Views Left Result Date: 10/03/2023 CLINICAL DATA:  Left leg pain after fall. EXAM: DG HIP (WITH OR WITHOUT PELVIS) 2-3V LEFT COMPARISON:  Left hip x-rays dated November 27, 2010. FINDINGS: Acute nondisplaced intertrochanteric fracture of the left femur. No dislocation. Osteopenia. Soft tissues are unremarkable. IMPRESSION: 1. Acute nondisplaced intertrochanteric left femur fracture. Electronically Signed   By: Elsie ONEIDA Shoulder M.D.   On: 10/03/2023 17:00       LOS: 1 day   Stephenia Vogan  [employer] on [email or web address]  10/04/2023, 9:24 AM

## 2023-10-04 NOTE — TOC CM/SW Note (Signed)
 Transition of Care [facility]) - Inpatient Brief Assessment   Patient Details  Name: [name] MRN: [id number] Date of Birth: Jan 27, 1941  [facility] CM/SW Contact:    Odella Ku, RN Phone Number: 10/04/2023, 11:00 AM   Clinical Narrative:  Brief assessment done.  Patient currently in the OR to repair hip fracture, anticipate PT/OT evaluations after surgery.  Transition of Care Asessment: Insurance and Status: Insurance coverage has been reviewed Patient has primary care physician: Yes Home environment has been reviewed: Yes Prior level of function:: Independent Prior/Current Home Services: No current home services Social Drivers of Health Review: SDOH reviewed no interventions necessary Readmission risk has been reviewed: Yes Transition of care needs: transition of care needs identified, TOC will continue to follow

## 2023-10-04 NOTE — Consult Note (Signed)
 ORTHOPAEDIC CONSULTATION  REQUESTING PHYSICIAN: Verdene Purchase, MD  Chief Complaint:   Left hip pain  History of Present Illness: [name] is a 83 y.o. female with multiple medical problems including anxiety/depression, hyperlipidemia, hypertension, hypothyroidism, and sleep apnea who lives independently with her husband and normally ambulates without any assistive devices.  Apparently the patient had gone to her local [employer] to buy some teabags when she slipped and fell, landing on her left side.  She managed to get back into her car and drove home, but was unable to get out of her car when she did get home.  EMS was called and she was brought to the emergency room where x-rays demonstrated a minimally displaced intertrochanteric fracture of the left hip.  The patient denies any associated injuries.  She did not strike her head or lose consciousness.  The patient also denies any lightheadedness, dizziness, chest pain, shortness of breath, or other symptoms which may have precipitated her fall.  Past Medical History:  Diagnosis Date   Allergy    Anemia    Anxiety    Arthritis    Cancer (HCC)    skin cancer   Hyperlipidemia    Hypertension    Hypothyroidism    Sciatica    Sleep apnea    does not use cpap   Thrombocytopenia (HCC)    Thyroid  disease    Past Surgical History:  Procedure Laterality Date   ABDOMINAL HYSTERECTOMY     APPENDECTOMY     BACK SURGERY     lower   BILATERAL CARPAL TUNNEL RELEASE     BREAST CYST ASPIRATION Left    neg   COLONOSCOPY     COLONOSCOPY WITH PROPOFOL  N/A 04/23/2021   Procedure: COLONOSCOPY WITH PROPOFOL ;  Surgeon: Janalyn Keene NOVAK, MD;  Location: [facility] END[facility];  Service: Endoscopy;  Laterality: N/A;   ENTROPIAN REPAIR Left 06/08/2020   Procedure: ENTROPION REPAIR, SUTURES ENTROPION REPAIR, EXTENSIVE LEFT;  Surgeon: Ashley Greig HERO, MD;  Location: [facility];   Service: Ophthalmology;  Laterality: Left;   JOINT REPLACEMENT     knees   KNEE SURGERY Bilateral    KNEE SURGERY     OPEN REDUCTION INTERNAL FIXATION (ORIF) DISTAL RADIAL FRACTURE Left 06/23/2019   Procedure: OPEN REDUCTION INTERNAL FIXATION (ORIF) DISTAL RADIAL FRACTURE;  Surgeon: Kathlynn Sharper, MD;  Location: [facility] ORS;  Service: Orthopedics;  Laterality: Left;   PARATHYROIDECTOMY  04/27/2020   salivary stone remove     SPINAL CORD STIMULATOR IMPLANT Right    SPINAL CORD STIMULATOR REMOVAL N/A 07/28/2022   Procedure: REMOVAL OF SPINAL CORD STIMULATOR;  Surgeon: Clois Fret, MD;  Location: [facility] ORS;  Service: Neurosurgery;  Laterality: N/A;   THYROIDECTOMY Right 04/27/2020   Procedure: PARATHYROIDECTOMY;  Surgeon: Gladis Cough, MD;  Location: [facility];  Service: General;  Laterality: Right;   TOTAL KNEE REVISION Right 08/13/2015   Procedure: TOTAL KNEE REVISION;  Surgeon: Cough Car, MD;  Location: [facility] ORS;  Service: Orthopedics;  Laterality: Right;   Social History   Socioeconomic History   Marital status: Married    Spouse name: ROBERT   Number of children: 4   Years of education: Not on file   Highest education level: GED or equivalent  Occupational History   Occupation: retired   Occupation: Retired  Tobacco Use   Smoking status: Every Day    Current packs/day: 0.50    Average packs/day: 0.5 packs/day for 40.0 years (20.0 ttl pk-yrs)    Types: Cigarettes  Smokeless tobacco: Never  Vaping Use   Vaping status: Never Used  Substance and Sexual Activity   Alcohol  use: No   Drug use: No   Sexual activity: Not Currently  Other Topics Concern   Not on file  Social History Narrative   Not on file   Social Drivers of Health   Financial Resource Strain: Low Risk  (05/16/2023)   Received from [facility]   Overall Financial Resource Strain (CARDIA)    Difficulty of Paying Living Expenses: Not very hard  Food Insecurity: No Food Insecurity  (10/04/2023)   Hunger Vital Sign    Worried About Running Out of Food in the Last Year: Never true    Ran Out of Food in the Last Year: Never true  Transportation Needs: No Transportation Needs (10/04/2023)   PRAPARE - Administrator, Civil Service (Medical): No    Lack of Transportation (Non-Medical): No  Physical Activity: Inactive (02/03/2023)   Exercise Vital Sign    Days of Exercise per Week: 0 days    Minutes of Exercise per Session: 30 min  Stress: No Stress Concern Present (08/18/2023)   [employer] of Occupational Health - Occupational Stress Questionnaire    Feeling of Stress : Only a little  Social Connections: Moderately Integrated (10/04/2023)   Social Connection and Isolation Panel [NHANES]    Frequency of Communication with Friends and Family: More than three times a week    Frequency of Social Gatherings with Friends and Family: Twice a week    Attends Religious Services: More than 4 times per year    Active Member of [employer] or Organizations: No    Attends Engineer, Structural: Never    Marital Status: Married   Family History  Problem Relation Age of Onset   Dementia Mother    Dementia Sister    Heart attack Brother    Diabetes Brother    Breast cancer Paternal Aunt    Heart attack Son    Hypercalcemia Neg Hx    Cancer Neg Hx    Allergies  Allergen Reactions   Codeine Nausea Only    Tolerates oxycodone    Morphine  Nausea And Vomiting   Ace Inhibitors Cough   Adhesive [Tape] Other (See Comments) and Rash    whelps   Prior to Admission medications   Medication Sig Start Date End Date Taking? Authorizing Provider  acetaminophen  (TYLENOL ) 500 MG tablet Take 500 mg by mouth every 6 (six) hours as needed for moderate pain.    Yes [provider]  amLODipine  (NORVASC ) 10 MG tablet TAKE 1 TABLET(10 MG) BY MOUTH DAILY 04/10/23  Yes Baity, Angeline ORN, NP  aspirin  EC 81 MG tablet Take 81 mg by mouth daily as needed for mild pain (pain  score 1-3) or moderate pain (pain score 4-6). Swallow whole.   Yes [provider]  atorvastatin  (LIPITOR) 40 MG tablet TAKE 1 TABLET(40 MG) BY MOUTH DAILY 07/31/23  Yes Baity, Angeline ORN, NP  citalopram  (CELEXA ) 20 MG tablet TAKE 1 TABLET(20 MG) BY MOUTH DAILY 04/10/23  Yes Antonette Angeline ORN, NP  levothyroxine  (SYNTHROID ) 150 MCG tablet TAKE 1 TABLET(150 MCG) BY MOUTH DAILY 04/10/23  Yes Antonette Angeline ORN, NP  losartan  (COZAAR ) 100 MG tablet TAKE 1 TABLET(100 MG) BY MOUTH DAILY 03/17/23  Yes Antonette Angeline ORN, NP  metoprolol  succinate (TOPROL -XL) 50 MG 24 hr tablet TAKE 1 TABLET BY MOUTH EVERY DAY WITH OR IMMEDIATELY FOLLOWING A MEAL 04/10/23  Yes  Antonette Angeline ORN, NP  Multiple Vitamin (MULTIVITAMIN) capsule Take 1 capsule by mouth daily.   Yes [provider]  traZODone  (DESYREL ) 50 MG tablet TAKE 1 TABLET(50 MG) BY MOUTH AT BEDTIME AS NEEDED FOR SLEEP 04/10/23  Yes Antonette Angeline ORN, NP   DG Ankle 2 Views Left Result Date: 10/03/2023 CLINICAL DATA:  Left ankle pain after a fall. EXAM: LEFT ANKLE - 2 VIEW COMPARISON:  None Available. FINDINGS: The left ankle is located. Soft tissue swelling is present at the medial malleolus. A joint effusion is present. No underlying fracture is present. IMPRESSION: 1. Soft tissue swelling at the medial malleolus without underlying fracture. 2. Joint effusion. Electronically Signed   By: Lonni Necessary M.D.   On: 10/03/2023 18:55   DG Chest 1 View Result Date: 10/03/2023 CLINICAL DATA:  Fall. EXAM: CHEST  1 VIEW COMPARISON:  Chest x-ray dated Feb 16, 2017. FINDINGS: The heart is at the upper limits of normal in size. Normal pulmonary vascularity. Negative three No acute osseous abnormality. IMPRESSION: 1. No active disease. Electronically Signed   By: Elsie ONEIDA Shoulder M.D.   On: 10/03/2023 17:01   DG Knee 2 Views Left Result Date: 10/03/2023 CLINICAL DATA:  Left leg pain after fall. EXAM: LEFT KNEE - 1-2 VIEW COMPARISON:  Left knee x-rays dated November 16, 2008. FINDINGS: Prior total knee arthroplasty. No evidence of hardware failure or loosening. No acute fracture or dislocation. No joint effusion. IMPRESSION: 1. No acute osseous abnormality. Electronically Signed   By: Elsie ONEIDA Shoulder M.D.   On: 10/03/2023 17:00   DG Hip Unilat With Pelvis 2-3 Views Left Result Date: 10/03/2023 CLINICAL DATA:  Left leg pain after fall. EXAM: DG HIP (WITH OR WITHOUT PELVIS) 2-3V LEFT COMPARISON:  Left hip x-rays dated November 27, 2010. FINDINGS: Acute nondisplaced intertrochanteric fracture of the left femur. No dislocation. Osteopenia. Soft tissues are unremarkable. IMPRESSION: 1. Acute nondisplaced intertrochanteric left femur fracture. Electronically Signed   By: Elsie ONEIDA Shoulder M.D.   On: 10/03/2023 17:00   Positive ROS: All other systems have been reviewed and were otherwise negative with the exception of those mentioned in the HPI and as above.  Physical Exam: General:  Alert, no acute distress Psychiatric:  Patient is not competent for consent, but exhibits normal mood and affect   Cardiovascular:  No pedal edema Respiratory:  No wheezing, non-labored breathing GI:  Abdomen is soft and non-tender Skin:  No lesions in the area of chief complaint Neurologic:  Sensation intact distally Lymphatic:  No axillary or cervical lymphadenopathy  Orthopedic Exam:  Orthopedic examination is limited to the left hip and lower extremity.  The left lower extremity is held in a somewhat flexed and externally rotated position.  Skin inspection around the left hip is unremarkable.  No swelling, erythema, ecchymosis, abrasions, or other skin abnormalities are identified.  She has mild tenderness palpation over the anterolateral aspect of the left hip.  She has more severe pain with any attempted active or passive motion of the left hip.  She is grossly neurovascularly intact to the left lower leg and foot, demonstrating the ability to dorsiflex and plantarflex her toes and ankle.   Sensations intact light touch in all distributions.  She has good capillary refill to her left foot.  X-rays:  Recent x-rays of the pelvis and left hip are available for review and have been reviewed by myself.  These films demonstrate a minimally displaced 2 part intertrochanteric fracture of the left hip.  No  significant degenerative changes of the left hip joint are noted.  No lytic lesions or other acute bony abnormalities are identified.  Assessment: Mildly displaced intertrochanteric fracture left hip.  Plan: The treatment options, including both surgical and nonsurgical choices, have been discussed in detail with the patient and her husband.  The patient and her husband would like to proceed with surgical intervention to include an intramedullary nailing of the left intertrochanteric hip fracture.  The risks (including bleeding, infection, nerve and/or blood vessel injury, persistent or recurrent pain, loosening or failure of the components, leg length inequality, malunion and/or nonunion, need for further surgery, blood clots, strokes, heart attacks or arrhythmias, pneumonia, etc.) and benefits of the surgical procedure were discussed. The patient and her husband state their understanding and agree to proceed.  A formal written consent will be obtained by the nursing staff.  Thank you for asking me to participate in the care of this most pleasant young unfortunate woman.  I will be happy to follow her with you.   DOROTHA Reyes Maltos, MD  Beeper #:  [phone]  10/04/2023 10:07 AM

## 2023-10-04 NOTE — Progress Notes (Signed)
 PT Cancellation Note  Patient Details Name: [name] MRN: [id number] DOB: 1940/11/16   Cancelled Treatment:    Reason Eval/Treat Not Completed: Other (comment) PT orders received, chart reviewed. Pt is POD0 for sx following traumatic fx & pt noted to have elevated HR/a-fib following sx, awaiting possible cardiology consult. Will hold PT evaluation at this time & f/u as able.  Richerd Pinal, PT, DPT 10/04/23, 2:28 PM   Richerd CHRISTELLA Pinal 10/04/2023, 2:27 PM

## 2023-10-04 NOTE — Op Note (Signed)
 10/04/2023 - 10/03/2023  12:32 PM  Patient:   [name]  Pre-Op Diagnosis:   Two-part intertrochanteric fracture left hip.  Post-Op Diagnosis:   Same  Procedure:   Reduction and internal fixation of intertrochanteric left hip fracture with Biomet Affixis TFN nail.  Surgeon:   DOROTHA Reyes Maltos, MD  Assistant:   None  Anesthesia:   GET  Findings:   As above  Complications:   None  EBL:   100 cc  Fluids:   900 cc crystalloid  UOP:   None  TT:   None  Drains:   None  Closure:   Staples  Implants:   Biomet Affixis 9 x 360 mm TFN with an 85 mm lag screw and a 40 mm distal interlocking screw  Brief Clinical Note:   The patient is an 83 year old female who sustained the above-noted injury yesterday afternoon when she slipped and fell while going to the grocery store to buy some teabags. She was able to drive herself home but then was unable to get out of the car due to pain. EMS was called and she was brought to the emergency room where x-rays demonstrated the above-noted injury. The patient has been cleared medically and presents at this time for reduction and internal fixation of the two-part intertrochanteric left hip fracture.  Procedure:   The patient was brought into the operating room and lain in the supine position. After adequate general endotracheal intubation and anesthesia was obtained, the patient was repositioned in the supine position on the fracture table. The uninjured leg was placed in a flexed and abducted position while the injured lower extremity was placed in longitudinal traction. The fracture was reduced using longitudinal traction and internal rotation. The adequacy of reduction was verified fluoroscopically in AP and lateral projections and found to be near anatomic. The lateral aspects of the left hip and thigh were prepped with ChloraPrep solution before being draped sterilely. Preoperative antibiotics were administered. A timeout was performed to  verify the appropriate surgical site.   The greater trochanter was identified fluoroscopically and an approximately 6-7 cm incision made about 2-3 fingerbreadths above the tip of the greater trochanter. The incision was carried down through the subcutaneous tissues to expose the gluteal fascia. This was split the length of the incision, providing access to the tip of the trochanter. Under fluoroscopic guidance, a guidewire was drilled through the tip of the trochanter into the proximal metaphysis to the level of the lesser trochanter. After verifying its position fluoroscopically in AP and lateral projections, it was overreamed with the initial reamer to the depth of the lesser trochanter. A guidewire was passed down through the femoral canal to the supracondylar region. The adequacy of guidewire position was verified fluoroscopically in AP and lateral projections before the length of the guidewire within the canal was measured and found to be 375 mm. Therefore, a 360 mm length nail was selected. The guidewire was overreamed sequentially using the flexible reamers, beginning with a 9.5 mm reamer and progressing to a 10.5 mm reamer. This provided good cortical chatter. The 9 x 360 mm Biomet Affixis TFN rod was selected and advanced to the appropriate depth, as verified fluoroscopically.   The guide system for the lag screw was positioned and advanced through an approximately 2 cm stab incision over the lateral aspect of the proximal femur. The guidewire was drilled up through the trochanteric femoral nail and into the femoral neck to rest within 5 mm of subchondral bone.  After verifying its position in the femoral neck and head in both AP and lateral projections, the guidewire was measured and found to be optimally replicated by an 85 mm lag screw. The guidewire was overreamed to the appropriate depth before the lag screw was inserted and advanced to the appropriate depth as verified fluoroscopically in AP and  lateral projections. The locking screw was advanced, then backed off a quarter turn to set the lag screw. Again the adequacy of hardware position and fracture reduction was verified fluoroscopically in AP and lateral projections and found to be excellent.  Attention was directed distally. Using the perfect circle technique, the leg and fluoroscopy machine were positioned appropriately. An approximately 1.5 cm stab incision was made over the skin at the appropriate point before the drill bit was advanced through the cortex and across the static hole of the nail. The appropriate length of the screw was determined before the 40 mm distal interlocking screw was positioned, then advanced and tightened securely. Again the adequacy of screw position was verified fluoroscopically in AP and lateral projections and found to be excellent.  The wounds were irrigated thoroughly with sterile saline solution before the abductor fascia was reapproximated using #0 Vicryl interrupted sutures. The subcutaneous tissues were closed using 2-0 Vicryl interrupted sutures. The skin was closed using staples. A solution of 20 cc of 0.5% Sensorcaine  and 10 cc of Exparel  was injected in and around all incisions. Sterile occlusive dressings were applied to all wounds before the patient was transferred back to his/her hospital bed. The patient was then returned to the recovery room in satisfactory condition after tolerating the procedure well.

## 2023-10-04 NOTE — Transfer of Care (Signed)
 Immediate Anesthesia Transfer of Care Note  Patient: [name]  Procedure(s) Performed: INTRAMEDULLARY (IM) NAIL INTERTROCHANTERIC (Left: Hip)  Patient Location: PACU  Anesthesia Type:General  Level of Consciousness: awake, alert , and oriented  Airway & Oxygen  Therapy: Patient Spontanous Breathing and Patient connected to face mask oxygen   Post-op Assessment: Report given to RN and Post -op Vital signs reviewed and stable  Post vital signs: Reviewed and stable  Last Vitals:  Vitals Value Taken Time  BP 133/87 10/04/23 1224  Temp    Pulse 102 10/04/23 1230  Resp 13 10/04/23 1230  SpO2 98 % 10/04/23 1230  Vitals shown include unfiled device data.  Last Pain:  Vitals:   10/04/23 0908  TempSrc: Oral  PainSc:          Complications: No notable events documented.

## 2023-10-04 NOTE — Anesthesia Procedure Notes (Signed)
 Procedure Name: Intubation Date/Time: 10/04/2023 10:42 AM  Performed by: Duwayne Craven, CRNAPre-anesthesia Checklist: Patient identified, Patient being monitored, Timeout performed, Emergency Drugs available and Suction available Patient Re-evaluated:Patient Re-evaluated prior to induction Oxygen  Delivery Method: Circle system utilized Preoxygenation: Pre-oxygenation with 100% oxygen  Induction Type: IV induction Ventilation: Mask ventilation without difficulty and Oral airway inserted - appropriate to patient size Laryngoscope Size: 3 and McGrath Grade View: Grade I Tube type: Oral Tube size: 6.5 mm Number of attempts: 1 Airway Equipment and Method: Stylet and Video-laryngoscopy Placement Confirmation: ETT inserted through vocal cords under direct vision, positive ETCO2 and breath sounds checked- equal and bilateral Secured at: 20 cm Tube secured with: Tape Dental Injury: Teeth and Oropharynx as per pre-operative assessment

## 2023-10-05 ENCOUNTER — Inpatient Hospital Stay
Admit: 2023-10-05 | Discharge: 2023-10-05 | Disposition: A | Discharge status: Home / Self Care | Payer: PPO | Attending: Cardiology | Admitting: Cardiology

## 2023-10-05 ENCOUNTER — Inpatient Hospital Stay: Admit: 2023-10-05 | Payer: PPO

## 2023-10-05 ENCOUNTER — Inpatient Hospital Stay (HOSPITAL_COMMUNITY)
Admit: 2023-10-05 | Discharge: 2023-10-05 | Disposition: A | Discharge status: Home / Self Care | Payer: PPO | Attending: Cardiology | Admitting: Cardiology

## 2023-10-05 DIAGNOSIS — S72002A Fracture of unspecified part of neck of left femur, initial encounter for closed fracture: Secondary | ICD-10-CM | POA: Diagnosis not present

## 2023-10-05 DIAGNOSIS — I4891 Unspecified atrial fibrillation: Secondary | ICD-10-CM

## 2023-10-05 DIAGNOSIS — Z01811 Encounter for preprocedural respiratory examination: Secondary | ICD-10-CM

## 2023-10-05 DIAGNOSIS — I480 Paroxysmal atrial fibrillation: Secondary | ICD-10-CM

## 2023-10-05 LAB — CBC
HCT: 38.9 % (ref 36.0–46.0)
Hemoglobin: 13.3 g/dL (ref 12.0–15.0)
MCH: 34.1 pg — ABNORMAL HIGH (ref 26.0–34.0)
MCHC: 34.2 g/dL (ref 30.0–36.0)
MCV: 99.7 fL (ref 80.0–100.0)
Platelets: 101 10*3/uL — ABNORMAL LOW (ref 150–400)
RBC: 3.9 MIL/uL (ref 3.87–5.11)
RDW: 12.1 % (ref 11.5–15.5)
WBC: 11.4 10*3/uL — ABNORMAL HIGH (ref 4.0–10.5)
nRBC: 0 % (ref 0.0–0.2)

## 2023-10-05 LAB — ECHOCARDIOGRAM COMPLETE
AR max vel: 3.1 cm2
AV Area VTI: 3.74 cm2
AV Area mean vel: 3.24 cm2
AV Mean grad: 2 mm[Hg]
AV Peak grad: 3.2 mm[Hg]
Ao pk vel: 0.9 m/s
Area-P 1/2: 3.89 cm2
Height: 61 in
S' Lateral: 2.3 cm
Weight: 2292.78 [oz_av]

## 2023-10-05 LAB — BASIC METABOLIC PANEL
Anion gap: 11 (ref 5–15)
BUN: 11 mg/dL (ref 8–23)
CO2: 23 mmol/L (ref 22–32)
Calcium: 9.3 mg/dL (ref 8.9–10.3)
Chloride: 104 mmol/L (ref 98–111)
Creatinine, Ser: 0.57 mg/dL (ref 0.44–1.00)
GFR, Estimated: >60 mL/min (ref >60)
Glucose, Bld: 147 mg/dL — ABNORMAL HIGH (ref 70–99)
Potassium: 3.9 mmol/L (ref 3.5–5.1)
Sodium: 138 mmol/L (ref 135–145)

## 2023-10-05 LAB — PROTIME-INR
INR: 1.1 (ref 0.8–1.2)
Prothrombin Time: 14.6 s (ref 11.4–15.2)

## 2023-10-05 LAB — TSH: TSH: 2.896 u[IU]/mL (ref 0.350–4.500)

## 2023-10-05 LAB — APTT: aPTT: 35 s (ref 24–36)

## 2023-10-05 LAB — T4, FREE: Free T4: 0.78 ng/dL (ref 0.61–1.12)

## 2023-10-05 LAB — MAGNESIUM: Magnesium: 2 mg/dL (ref 1.7–2.4)

## 2023-10-05 MED ORDER — METOPROLOL TARTRATE 25 MG PO TABS
12.5000 mg | ORAL_TABLET | Freq: Four times a day (QID) | ORAL | Status: DC
  Administered 2023-10-05 – 2023-10-06 (×4): 12.5 mg via ORAL
  Filled 2023-10-05 (×4): qty 1

## 2023-10-05 MED ORDER — ENSURE ENLIVE PO LIQD
237.0000 mL | Freq: Two times a day (BID) | ORAL | Status: DC
  Administered 2023-10-06 – 2023-10-07 (×4): 237 mL via ORAL

## 2023-10-05 MED ORDER — HEPARIN (PORCINE) 25000 UT/250ML-% IV SOLN
1200.0000 [IU]/h | INTRAVENOUS | Status: DC
  Administered 2023-10-05 – 2023-10-06 (×2): 900 [IU]/h via INTRAVENOUS
  Administered 2023-10-07: 1100 [IU]/h via INTRAVENOUS
  Administered 2023-10-08: 1200 [IU]/h via INTRAVENOUS
  Filled 2023-10-05 (×4): qty 250

## 2023-10-05 MED ORDER — MELATONIN 5 MG PO TABS
10.0000 mg | ORAL_TABLET | Freq: Every day | ORAL | Status: DC
  Administered 2023-10-05 – 2023-10-14 (×10): 10 mg via ORAL
  Filled 2023-10-05 (×10): qty 2

## 2023-10-05 MED ORDER — HEPARIN BOLUS VIA INFUSION
3000.0000 [IU] | Freq: Once | INTRAVENOUS | Status: AC
  Administered 2023-10-05: 3000 [IU] via INTRAVENOUS
  Filled 2023-10-05: qty 3000

## 2023-10-05 MED ORDER — ADULT MULTIVITAMIN W/MINERALS CH
1.0000 | ORAL_TABLET | Freq: Every day | ORAL | Status: DC
  Administered 2023-10-06 – 2023-10-15 (×10): 1 via ORAL
  Filled 2023-10-05 (×10): qty 1

## 2023-10-05 NOTE — Plan of Care (Signed)

## 2023-10-05 NOTE — Plan of Care (Signed)
  Problem: Education: Goal: Knowledge of General Education information will improve Description: Including pain rating scale, medication(s)/side effects and non-pharmacologic comfort measures Outcome: Not Progressing   Problem: Health Behavior/Discharge Planning: Goal: Ability to manage health-related needs will improve Outcome: Not Progressing   Problem: Clinical Measurements: Goal: Respiratory complications will improve Outcome: Progressing Goal: Cardiovascular complication will be avoided Outcome: Progressing   Problem: Activity: Goal: Risk for activity intolerance will decrease Outcome: Progressing

## 2023-10-05 NOTE — Progress Notes (Signed)
 Initial Nutrition Assessment  DOCUMENTATION CODES:   Not applicable  INTERVENTION:   Ensure Enlive po BID, each supplement provides 350 kcal and 20 grams of protein.  Magic cup TID with meals, each supplement provides 290 kcal and 9 grams of protein  MVI po daily   Liberalize diet   Pt at high refeed risk; recommend monitor potassium, magnesium  and phosphorus labs daily until stable  Daily weights   NUTRITION DIAGNOSIS:   Inadequate oral intake related to acute illness as evidenced by meal completion < 25%.  GOAL:   Patient will meet greater than or equal to 90% of their needs  MONITOR:   PO intake, Supplement acceptance, Labs, Weight trends, I & O's, Skin  REASON FOR ASSESSMENT:   Consult Hip fracture protocol  ASSESSMENT:   83 y/o female with h/o HTN, depression, anxiety, HLD, RLS and parathyroidectomy who is admitted with hip fracture after fall now s/p ORIF 1/12 complicated by Afib and delerium.  Met with pt in room today. Pt with AMS today and is unable to provide any reliable history. Pt reports good appetite and oral intake at baseline. Pt believes that she went out to eat today for lunch. Pt is documented to be eating sips and bites of meals. RD discussed with pt the importance of adequate nutrition needed to preserve lean muscle and to support healing. Pt is agreeable to try chocolate Ensure. RD will add supplements and MVI to help pt meet his estimated needs. Pt is at refeed risk. Per chart, pt is down 24lbs(13%) over the past year; this is not significant.   Medications reviewed and include: celexa , colace, synthroid , miralax , heparin , senokot  Labs reviewed: K 3.9 wnl, Mg 2.0 wnl Wbc- 11.4(H)  NUTRITION - FOCUSED PHYSICAL EXAM:  Flowsheet Row Most Recent Value  Orbital Region No depletion  Upper Arm Region No depletion  Thoracic and Lumbar Region No depletion  Buccal Region No depletion  Temple Region No depletion  Clavicle Bone Region Mild  depletion  Clavicle and Acromion Bone Region Mild depletion  Scapular Bone Region No depletion  Dorsal Hand No depletion  Patellar Region No depletion  Anterior Thigh Region No depletion  Posterior Calf Region No depletion  Edema (RD Assessment) Mild  Hair Reviewed  Eyes Reviewed  Mouth Reviewed  Skin Reviewed  Nails Reviewed   Diet Order:   Diet Order             Diet Heart Room service appropriate? Yes; Fluid consistency: Thin  Diet effective now                  EDUCATION NEEDS:   Education needs have been addressed  Skin:  Skin Assessment: Reviewed RN Assessment (incision L leg)  Last BM:  1/11  Height:   Ht Readings from Last 1 Encounters:  10/03/23 5' 1 (1.549 m)    Weight:   Wt Readings from Last 1 Encounters:  10/03/23 65 kg    Ideal Body Weight:  47.7 kg  BMI:  Body mass index is 27.08 kg/m.  Estimated Nutritional Needs:   Kcal:  1400-1600kcal/day  Protein:  70-80g/day  Fluid:  1.2-1.4L/day  Augustin Shams MS, RD, LDN If unable to be reached, please send secure chat to RD inpatient available from 8:00a-4:00p daily

## 2023-10-05 NOTE — Consult Note (Signed)
 Cardiology Consultation   Patient ID: SANTIANA Figueroa MRN: [id number]; DOB: 1941-08-24  Admit date: 10/03/2023 Date of Consult: 10/05/2023  PCP:  Antonette Angeline ORN, NP   [facility] Providers Cardiologist:  None      New consult completed by Dr Darron  Patient Profile:   [name] is a 83 y.o. female with a hx of hypertension, hyperlipidemia, hypothyroidism, obstructive sleep apnea not on CPAP, anemia, anxiety, arthritis, status post left intramedullary nail intratrochanteric who is being seen 10/05/2023 for the evaluation of post operative atrial fibrillation at the request of Dr Dorinda.  History of Present Illness:   [name] arrived to the [facility] emergency department via EMS on 10/03/23 after she fell in the [employer] parking lot.  She was able to get into her car with help and drove home.  EMS picked her up at home as she could not get out of the car.  She was complaining of left leg pain primarily in the left knee.  She states that she did not pass out and remembers the fall.  She thinks she just tripped or slipped on black ice.  She denies hitting her head or any other injuries she denied pain anywhere else.   On arrival to the emergency department vital signs were stable.  Blood pressure was noted at 152/74, heart rate of 87, respirations of 14, temperature 98.6, SpO2 96% on room air.  She was awake alert and oriented.  She did good peripheral perfusion.  Pertinent labs revealed a serum sodium 138, potassium 3.4, chloride 104, bicarb 23, BUN 15, serum creatinine 0.59, with a EGFR greater than 60, nonfasting blood glucose 99, WBCs of 11.7, platelets of 130, hemoglobin of 15.8. EKG reveals sinus rhythm with a rate of 92 with no ST elevations.  X-ray of the left hip revealed an acute nondisplaced intertronchanteric left femur fracture.  She was treated with fentanyl .  She was neurovascularly intact.  Hospitalist was called for admission and orthopedics was called.  On  10/04/2023 she underwent surgery with orthopedics for left hip fracture.  Patient did not require further testing prior to surgery.  She had no history of heart failure or history of atrial fibrillation.  Postoperatively patient went into atrial fibrillation with RVR in the PACU.  She was given IV metoprolol .  Was unable to be started on Cardizem infusion due to soft blood pressures. She did not appear to be in any acute distress.  Telemetry showed atrial fibrillation 120-130 bpm.  Blood pressure was slightly soft after 5 mg of IV metoprolol  which continued to remain asymptomatic.  She was started on amiodarone  infusion.  Unfortunately cannot give anticoagulation due to recent surgery.  Anticoagulation was to be reconsidered in the morning if okay with orthopedics.    Cardiology was consulted with new diagnosis of postoperative atrial fibrillation.   Past Medical History:  Diagnosis Date   Allergy    Anemia    Anxiety    Arthritis    Cancer (HCC)    skin cancer   Hyperlipidemia    Hypertension    Hypothyroidism    Sciatica    Sleep apnea    does not use cpap   Thrombocytopenia (HCC)    Thyroid  disease     Past Surgical History:  Procedure Laterality Date   ABDOMINAL HYSTERECTOMY     APPENDECTOMY     BACK SURGERY     lower   BILATERAL CARPAL TUNNEL RELEASE     BREAST  CYST ASPIRATION Left    neg   COLONOSCOPY     COLONOSCOPY WITH PROPOFOL  N/A 04/23/2021   Procedure: COLONOSCOPY WITH PROPOFOL ;  Surgeon: Janalyn Keene NOVAK, MD;  Location: [facility];  Service: Endoscopy;  Laterality: N/A;   ENTROPIAN REPAIR Left 06/08/2020   Procedure: ENTROPION REPAIR, SUTURES ENTROPION REPAIR, EXTENSIVE LEFT;  Surgeon: Ashley Greig HERO, MD;  Location: [facility];  Service: Ophthalmology;  Laterality: Left;   JOINT REPLACEMENT     knees   KNEE SURGERY Bilateral    KNEE SURGERY     OPEN REDUCTION INTERNAL FIXATION (ORIF) DISTAL RADIAL FRACTURE Left 06/23/2019   Procedure: OPEN  REDUCTION INTERNAL FIXATION (ORIF) DISTAL RADIAL FRACTURE;  Surgeon: Kathlynn Sharper, MD;  Location: [facility];  Service: Orthopedics;  Laterality: Left;   PARATHYROIDECTOMY  04/27/2020   salivary stone remove     SPINAL CORD STIMULATOR IMPLANT Right    SPINAL CORD STIMULATOR REMOVAL N/A 07/28/2022   Procedure: REMOVAL OF SPINAL CORD STIMULATOR;  Surgeon: Clois Fret, MD;  Location: [facility] OR[facility];  Service: Neurosurgery;  Laterality: N/A;   THYROIDECTOMY Right 04/27/2020   Procedure: PARATHYROIDECTOMY;  Surgeon: Gladis Cough, MD;  Location: [facility];  Service: General;  Laterality: Right;   TOTAL KNEE REVISION Right 08/13/2015   Procedure: TOTAL KNEE REVISION;  Surgeon: Cough Car, MD;  Location: [facility];  Service: Orthopedics;  Laterality: Right;     Home Medications:  Prior to Admission medications   Medication Sig Start Date End Date Taking? Authorizing Provider  acetaminophen  (TYLENOL ) 500 MG tablet Take 500 mg by mouth every 6 (six) hours as needed for moderate pain.    Yes [provider]  amLODipine  (NORVASC ) 10 MG tablet TAKE 1 TABLET(10 MG) BY MOUTH DAILY 04/10/23  Yes Baity, Angeline ORN, NP  aspirin  EC 81 MG tablet Take 81 mg by mouth daily as needed for mild pain (pain score 1-3) or moderate pain (pain score 4-6). Swallow whole.   Yes [provider]  atorvastatin  (LIPITOR) 40 MG tablet TAKE 1 TABLET(40 MG) BY MOUTH DAILY 07/31/23  Yes Baity, Angeline ORN, NP  citalopram  (CELEXA ) 20 MG tablet TAKE 1 TABLET(20 MG) BY MOUTH DAILY 04/10/23  Yes Antonette Angeline ORN, NP  levothyroxine  (SYNTHROID ) 150 MCG tablet TAKE 1 TABLET(150 MCG) BY MOUTH DAILY 04/10/23  Yes Antonette Angeline ORN, NP  losartan  (COZAAR ) 100 MG tablet TAKE 1 TABLET(100 MG) BY MOUTH DAILY 03/17/23  Yes Antonette Angeline ORN, NP  metoprolol  succinate (TOPROL -XL) 50 MG 24 hr tablet TAKE 1 TABLET BY MOUTH EVERY DAY WITH OR IMMEDIATELY FOLLOWING A MEAL 04/10/23  Yes Antonette Angeline ORN, NP  Multiple Vitamin (MULTIVITAMIN) capsule Take 1  capsule by mouth daily.   Yes [provider]  traZODone  (DESYREL ) 50 MG tablet TAKE 1 TABLET(50 MG) BY MOUTH AT BEDTIME AS NEEDED FOR SLEEP 04/10/23  Yes Antonette Angeline ORN, NP    Inpatient Medications: Scheduled Meds:  acetaminophen   500 mg Oral Q6H   amLODipine   10 mg Oral Daily   atorvastatin   40 mg Oral QHS   citalopram   20 mg Oral Daily   docusate sodium   100 mg Oral BID   enoxaparin  (LOVENOX ) injection  40 mg Subcutaneous Q24H   levothyroxine   150 mcg Oral Once per day on Monday Tuesday Wednesday Thursday Friday Saturday   losartan   100 mg Oral Daily   metoprolol  succinate  50 mg Oral Daily   polyethylene glycol  17 g Oral Daily   senna-docusate  2 tablet Oral QHS  Continuous Infusions:  amiodarone  30 mg/hr (10/05/23 0551)   dextrose  5 % and 0.9 % NaCl 75 mL/hr at 10/05/23 0917   PRN Meds: acetaminophen , bisacodyl , diphenhydrAMINE , hydrALAZINE , HYDROcodone -acetaminophen , HYDROmorphone  (DILAUDID ) injection, magnesium  hydroxide, metoCLOPramide  **OR** metoCLOPramide  (REGLAN ) injection, metoprolol  tartrate, ondansetron  **OR** ondansetron  (ZOFRAN ) IV, sodium phosphate , traZODone   Allergies:    Allergies  Allergen Reactions   Codeine Nausea Only    Tolerates oxycodone    Morphine  Nausea And Vomiting   Ace Inhibitors Cough   Adhesive [Tape] Other (See Comments) and Rash    whelps    Social History:   Social History   Socioeconomic History   Marital status: Married    Spouse name: ROBERT   Number of children: 4   Years of education: Not on file   Highest education level: GED or equivalent  Occupational History   Occupation: retired   Occupation: Retired  Tobacco Use   Smoking status: Every Day    Current packs/day: 0.50    Average packs/day: 0.5 packs/day for 40.0 years (20.0 ttl pk-yrs)    Types: Cigarettes   Smokeless tobacco: Never  Vaping Use   Vaping status: Never Used  Substance and Sexual Activity   Alcohol  use: No   Drug use: No   Sexual  activity: Not Currently  Other Topics Concern   Not on file  Social History Narrative   Not on file   Social Drivers of Health   Financial Resource Strain: Low Risk  (05/16/2023)   Received from [facility]   Overall Financial Resource Strain (CARDIA)    Difficulty of Paying Living Expenses: Not very hard  Food Insecurity: No Food Insecurity (10/04/2023)   Hunger Vital Sign    Worried About Running Out of Food in the Last Year: Never true    Ran Out of Food in the Last Year: Never true  Transportation Needs: No Transportation Needs (10/04/2023)   PRAPARE - Administrator, Civil Service (Medical): No    Lack of Transportation (Non-Medical): No  Physical Activity: Inactive (02/03/2023)   Exercise Vital Sign    Days of Exercise per Week: 0 days    Minutes of Exercise per Session: 30 min  Stress: No Stress Concern Present (08/18/2023)   [employer] of Occupational Health - Occupational Stress Questionnaire    Feeling of Stress : Only a little  Social Connections: Moderately Integrated (10/04/2023)   Social Connection and Isolation Panel [NHANES]    Frequency of Communication with Friends and Family: More than three times a week    Frequency of Social Gatherings with Friends and Family: Twice a week    Attends Religious Services: More than 4 times per year    Active Member of [employer] or Organizations: No    Attends Banker Meetings: Never    Marital Status: Married  Catering Manager Violence: Not At Risk (10/04/2023)   Humiliation, Afraid, Rape, and Kick questionnaire    Fear of Current or Ex-Partner: No    Emotionally Abused: No    Physically Abused: No    Sexually Abused: No    Family History:    Family History  Problem Relation Age of Onset   Dementia Mother    Dementia Sister    Heart attack Brother    Diabetes Brother    Breast cancer Paternal Aunt    Heart attack Son    Hypercalcemia Neg Hx    Cancer Neg Hx      ROS:   Please see  the history of present illness.  Review of Systems  Constitutional:  Positive for malaise/fatigue.  Musculoskeletal:  Positive for falls and joint pain.  Neurological:  Positive for weakness.    All other ROS reviewed and negative.     Physical Exam/Data:   Vitals:   10/05/23 0000 10/05/23 0200 10/05/23 0409 10/05/23 0800  BP: (!) 155/98 (!) 133/94 (!) 141/91   Pulse: (!) 112 (!) 110 (!) 115   Resp: 17 15 (!) 24   Temp:   98.2 F (36.8 C) 98.6 F (37 C)  TempSrc:   Oral Oral  SpO2: 90% 95% 97%   Weight:      Height:        Intake/Output Summary (Last 24 hours) at 10/05/2023 1001 Last data filed at 10/05/2023 0644 Gross per 24 hour  Intake 1740.25 ml  Output 800 ml  Net 940.25 ml      10/03/2023    4:15 PM 09/30/2023    8:26 AM 05/07/2023    1:14 PM  Last 3 Weights  Weight (lbs) 143 lb 4.8 oz 143 lb 9.6 oz 146 lb  Weight (kg) 65 kg 65.137 kg 66.225 kg     Body mass index is 27.08 kg/m.  General:  Well nourished, well developed, in no acute distress HEENT: normal Neck: no JVD Vascular: No carotid bruits; Distal pulses 2+ bilaterally Cardiac:  normal S1, S2; IR IR; no murmur  Lungs:  clear to auscultation bilaterally, no wheezing, rhonchi or rales, respirations are unlabored at rest on room air Abd: soft, nontender, no hepatomegaly  Ext: no edema Musculoskeletal:  No deformities, BUE and BLE strength normal and equal Skin: warm and dry  Neuro:  CNs 2-12 intact, no focal abnormalities noted Psych:  Pleasantly confused with safety mittens on bilaterally   EKG:  The EKG was personally reviewed and demonstrates:  sinus rate of 92, repolarization noted in lateral leads Telemetry:  Telemetry was personally reviewed and demonstrates:  rate controlled atrial fibrillation overnight  Relevant CV Studies: Echocardiogram ordered and pending  Laboratory Data:  High Sensitivity Troponin:  No results for input(s): TROPONINIHS in the last 720 hours.    Chemistry Recent Labs  Lab 10/03/23 1618 10/04/23 0411 10/04/23 0924 10/05/23 0433  NA 138 137  --  138  K 3.4* 3.1*  --  3.9  CL 104 103  --  104  CO2 23 23  --  23  GLUCOSE 99 88  --  147*  BUN 15 12  --  11  CREATININE 0.59 0.47  --  0.57  CALCIUM  9.6 9.3  --  9.3  MG  --   --  2.1 2.0  GFRNONAA >60 >60  --  >60  ANIONGAP 11 11  --  11    Recent Labs  Lab 09/30/23 0846  PROT 6.8  AST 14  ALT 10  BILITOT 0.8   Lipids  Recent Labs  Lab 09/30/23 0846  CHOL 202*  TRIG 136  HDL 55  LDLCALC 122*  CHOLHDL 3.7    Hematology Recent Labs  Lab 10/03/23 1618 10/04/23 0411 10/05/23 0433  WBC 11.7* 8.0 11.4*  RBC 4.60 3.92 3.90  HGB 15.8* 13.4 13.3  HCT 46.8* 38.5 38.9  MCV 101.7* 98.2 99.7  MCH 34.3* 34.2* 34.1*  MCHC 33.8 34.8 34.2  RDW 12.5 12.4 12.1  PLT 130* 100* 101*   Thyroid   Recent Labs  Lab 10/05/23 0433  TSH 2.896  FREET4 0.78    BNPNo results  for input(s): BNP, PROBNP in the last 168 hours.  DDimer No results for input(s): DDIMER in the last 168 hours.   Radiology/Studies:  DG HIP UNILAT WITH PELVIS 2-3 VIEWS LEFT Result Date: 10/04/2023 CLINICAL DATA:  Elective surgery. EXAM: DG HIP (WITH OR WITHOUT PELVIS) 2-3V LEFT COMPARISON:  Preoperative imaging. FINDINGS: Four fluoroscopic spot views of the pelvis and left hip/femur obtained in the operating room. Images during hip arthroplasty. Fluoroscopy time 37 seconds. Dose 3.9411 mGy. IMPRESSION: Intraoperative fluoroscopy during left hip fracture fixation. Electronically Signed   By: Andrea Gasman M.D.   On: 10/04/2023 15:56   DG C-Arm 1-60 Min-No Report Result Date: 10/04/2023 Fluoroscopy was utilized by the requesting physician.  No radiographic interpretation.   DG C-Arm 1-60 Min-No Report Result Date: 10/04/2023 Fluoroscopy was utilized by the requesting physician.  No radiographic interpretation.   DG Ankle 2 Views Left Result Date: 10/03/2023 CLINICAL DATA:  Left ankle pain  after a fall. EXAM: LEFT ANKLE - 2 VIEW COMPARISON:  None Available. FINDINGS: The left ankle is located. Soft tissue swelling is present at the medial malleolus. A joint effusion is present. No underlying fracture is present. IMPRESSION: 1. Soft tissue swelling at the medial malleolus without underlying fracture. 2. Joint effusion. Electronically Signed   By: Lonni Necessary M.D.   On: 10/03/2023 18:55   DG Chest 1 View Result Date: 10/03/2023 CLINICAL DATA:  Fall. EXAM: CHEST  1 VIEW COMPARISON:  Chest x-ray dated Feb 16, 2017. FINDINGS: The heart is at the upper limits of normal in size. Normal pulmonary vascularity. Negative three No acute osseous abnormality. IMPRESSION: 1. No active disease. Electronically Signed   By: Elsie ONEIDA Shoulder M.D.   On: 10/03/2023 17:01   DG Knee 2 Views Left Result Date: 10/03/2023 CLINICAL DATA:  Left leg pain after fall. EXAM: LEFT KNEE - 1-2 VIEW COMPARISON:  Left knee x-rays dated November 16, 2008. FINDINGS: Prior total knee arthroplasty. No evidence of hardware failure or loosening. No acute fracture or dislocation. No joint effusion. IMPRESSION: 1. No acute osseous abnormality. Electronically Signed   By: Elsie ONEIDA Shoulder M.D.   On: 10/03/2023 17:00   DG Hip Unilat With Pelvis 2-3 Views Left Result Date: 10/03/2023 CLINICAL DATA:  Left leg pain after fall. EXAM: DG HIP (WITH OR WITHOUT PELVIS) 2-3V LEFT COMPARISON:  Left hip x-rays dated November 27, 2010. FINDINGS: Acute nondisplaced intertrochanteric fracture of the left femur. No dislocation. Osteopenia. Soft tissues are unremarkable. IMPRESSION: 1. Acute nondisplaced intertrochanteric left femur fracture. Electronically Signed   By: Elsie ONEIDA Shoulder M.D.   On: 10/03/2023 17:00     Assessment and Plan:   Post-op atrial fibrillation -was found to be in atrial fibrillation post op with RVR -given metoprolol  IV with drop in blood pressure -started on amiodarone  drip per protocol -remains in atrial  fibrillation currently on bedside monitoring -metoprolol  changed from 50 mg daily to 12.5 mg every 6 hours to assist with rate control, will up titrate as tolerated by blood pressure -agitation likely a contributing factor with patient currently wearing safety mittens and agitated -not currently on anticoagulation with CHA2DS2-VASc of 4 for stroke prophylaxis, start AC once OK with orthopedics, will reach out to orthopedics this morning -Echocardiogram ordered and pending with further recommendations to follow -continued on telemetry monitoring - TSH 2.896 -Potassium 3.9 and Magnesium  2.0  Hypertension -blood pressure 141/91 -patient agitated -Continued on amlodipine  10 mg daily, losartan  100 mg daily, metoprolol  tartrate 12.5 mg every 6 hours -Vital signs  per unit protocol  Hyperlipidemia -Continued on atorvastatin  40 mg daily  S/p left hip repair -Continue management by orthopedics  Delirium  -Patient remains agitated and remains in safety minutes, family remains at the bedside -She was given trazodone  and Benadryl  last evening and has not slept -Likely driving her atrial fibrillation -Continued management per IM   Risk Assessment/Risk Scores:          CHA2DS2-VASc Score = 4   This indicates a 4.8% annual risk of stroke. The patient's score is based upon: CHF History: 0 HTN History: 1 Diabetes History: 0 Stroke History: 0 Vascular Disease History: 0 Age Score: 2 Gender Score: 1         For questions or updates, please contact [facility] Please consult [email or web address] for contact info under    Signed, Joclyn Alsobrook, NP  10/05/2023 10:01 AM

## 2023-10-05 NOTE — Progress Notes (Signed)
 Activating telesitter

## 2023-10-05 NOTE — Evaluation (Signed)
 Physical Therapy Evaluation Patient Details Name: [name] MRN: [id number] DOB: 25-Oct-1940 Today's Date: 10/05/2023  History of Present Illness  Pt is an 83 y.o. female presenting to hospital 10/03/23 d/t concerns for L leg pain after fall.  Imaging showing acute nondisplaced intertrochanteric L femur fx.  Pt s/p 10/04/23 L intertrochanteric IM nail.  Pt went into afib with RVR in PACU after surgery.  PMH includes htn, HLD, depression, anxiety, anemia, sleep apnea, lower back surgery, B CTR, B knee sx, ORIF distal radial fx L 2020, spinal cord stimulator implant and removal, total knee revision.  Clinical Impression  Prior to recent medical concerns, pt was independent with ambulation; lives with her husband in 1 level home with ramp to enter; h/o multiple falls in past year.  L hip/thigh pain 2-3/10 at rest beginning/end of session but pain increased with activity.  Currently pt is 1-2 assist with bed mobility; min to mod assist x2 to stand up to youth sized RW; and min assist x2 to side step to R along bed a couple feet with walker use (limited d/t L hip/thigh pain).  Pt's HR fluctuating between 101-120 bpm during sessions activities.  Pt would currently benefit from skilled PT to address noted impairments and functional limitations (see below for any additional details).  Upon hospital discharge, pt would benefit from ongoing therapy.     If plan is discharge home, recommend the following: Two people to help with walking and/or transfers;A lot of help with bathing/dressing/bathroom;Assistance with cooking/housework;Assist for transportation;Help with stairs or ramp for entrance   Can travel by private vehicle   No    Equipment Recommendations Rolling walker (2 wheels);BSC/3in1;Wheelchair (measurements PT);Wheelchair cushion (measurements PT)  Recommendations for Other Services  OT consult    Functional Status Assessment Patient has had a recent decline in their functional status and  demonstrates the ability to make significant improvements in function in a reasonable and predictable amount of time.     Precautions / Restrictions Precautions Precautions: Fall Restrictions Weight Bearing Restrictions Per Provider Order: Yes LLE Weight Bearing Per Provider Order: Weight bearing as tolerated      Mobility  Bed Mobility Overal bed mobility: Needs Assistance Bed Mobility: Supine to Sit, Sit to Supine     Supine to sit: Max assist, HOB elevated, Used rails Sit to supine: Min assist, Mod assist, +2 for physical assistance   General bed mobility comments: vc's for technique; assist for trunk and B LE's    Transfers Overall transfer level: Needs assistance Equipment used: Rolling walker (2 wheels) Transfers: Sit to/from Stand Sit to Stand: Min assist, Mod assist, +2 physical assistance           General transfer comment: vc's for UE/LE placement/positioning; assist to initiate and come to full stand; assist to control descent sitting    Ambulation/Gait Ambulation/Gait assistance: Min assist, +2 physical assistance Gait Distance (Feet):  (sidestep to R along bed a couple feet) Assistive device: Rolling walker (2 wheels)   Gait velocity: decreased     General Gait Details: antalgic; decreased stance time L LE; vc's for technique  Stairs            Wheelchair Mobility     Tilt Bed    Modified Rankin (Stroke Patients Only)       Balance Overall balance assessment: Needs assistance Sitting-balance support: No upper extremity supported, Feet supported Sitting balance-Leahy Scale: Good Sitting balance - Comments: steady reaching within BOS   Standing balance support: Bilateral upper  extremity supported, Reliant on assistive device for balance Standing balance-Leahy Scale: Poor Standing balance comment: assist to steady in standing with B UE support on RW                             Pertinent Vitals/Pain Pain Assessment Pain  Assessment: Faces Faces Pain Scale: Hurts a little bit (2/10 at rest; 6/10 with activity) Pain Location: L hip/thigh Pain Descriptors / Indicators: Aching, Sore, Tender (sharp/shooting with activity; aching/tender/sore at rest) Pain Intervention(s): Limited activity within patient's tolerance, Monitored during session, Repositioned SpO2 sats stable on room air during sessions activities (in mid 90's)    Home Living Family/patient expects to be discharged to:: Private residence Living Arrangements: Spouse/significant other Available Help at Discharge: Family;Available PRN/intermittently Type of Home: House Home Access: Ramped entrance       Home Layout: One level Home Equipment: Agricultural Consultant (2 wheels);Rollator (4 wheels);Cane - single point;Wheelchair - manual;Shower seat      Prior Function Prior Level of Function : Independent/Modified Independent             Mobility Comments: 3 additional falls reported in past year.  Typically independent with ambulation.       Extremity/Trunk Assessment   Upper Extremity Assessment Upper Extremity Assessment: Overall WFL for tasks assessed    Lower Extremity Assessment Lower Extremity Assessment: Generalized weakness;LLE deficits/detail LLE Deficits / Details: L hip flexion at least 2+/5 (limited d/t L hip/thigh pain); L knee flexion/extension and DF at least 3/5 AROM LLE: Unable to fully assess due to pain    Cervical / Trunk Assessment Cervical / Trunk Assessment: Normal  Communication   Communication Communication: Difficulty following commands/understanding Following commands: Follows one step commands inconsistently Cueing Techniques: Verbal cues;Visual cues  Cognition Arousal: Alert Behavior During Therapy: Impulsive Overall Cognitive Status: Impaired/Different from baseline                                 General Comments: Oriented to person and that she fell and broke her leg.  Pt did not know she  had surgery, where she was, or the date.        General Comments General comments (skin integrity, edema, etc.): scant drainage noted distal L thigh dressing.  Nursing cleared pt for participation in physical therapy.  Pt agreeable to PT session.  Pt's husband and son present beginning/end of session (stepped out during session).    Exercises     Assessment/Plan    PT Assessment Patient needs continued PT services  PT Problem List Decreased strength;Decreased activity tolerance;Decreased balance;Decreased mobility;Decreased cognition;Decreased knowledge of use of DME;Decreased safety awareness;Decreased knowledge of precautions;Cardiopulmonary status limiting activity;Decreased skin integrity;Pain       PT Treatment Interventions DME instruction;Gait training;Functional mobility training;Therapeutic activities;Therapeutic exercise;Balance training;Patient/family education    PT Goals (Current goals can be found in the Care Plan section)  Acute Rehab PT Goals Patient Stated Goal: to improve walking PT Goal Formulation: With patient/family Time For Goal Achievement: 10/19/23 Potential to Achieve Goals: Good    Frequency 7X/week     Co-evaluation               AM-PAC PT 6 Clicks Mobility  Outcome Measure Help needed turning from your back to your side while in a flat bed without using bedrails?: A Lot Help needed moving from lying on your back to sitting on the side  of a flat bed without using bedrails?: A Lot Help needed moving to and from a bed to a chair (including a wheelchair)?: Total Help needed standing up from a chair using your arms (e.g., wheelchair or bedside chair)?: Total Help needed to walk in hospital room?: Total Help needed climbing 3-5 steps with a railing? : Total 6 Click Score: 8    End of Session Equipment Utilized During Treatment: Gait belt Activity Tolerance: Patient tolerated treatment well Patient left: in bed;with call bell/phone within  reach;with bed alarm set;with family/visitor present;with SCD's reapplied;Other (comment) (B heels floating via pillow support) Nurse Communication: Mobility status;Precautions;Weight bearing status;Other (comment) (Pt's HR during session; pt's pain status) PT Visit Diagnosis: Other abnormalities of gait and mobility (R26.89);Muscle weakness (generalized) (M62.81);History of falling (Z91.81);Pain Pain - Right/Left: Left Pain - part of body: Hip    Time: 8557-8476 PT Time Calculation (min) (ACUTE ONLY): 41 min   Charges:   PT Evaluation $PT Eval Low Complexity: 1 Low PT Treatments $Therapeutic Activity: 23-37 mins PT General Charges $$ ACUTE PT VISIT: 1 Visit        Damien Caulk, PT 10/05/23, 4:05 PM

## 2023-10-05 NOTE — Progress Notes (Signed)
 Patient is pleasantly confused. Patient needs to be reoriented frequently. Patient has removed 2 ivs. Mitts have been placed. Patient has removed telemonitor. Patient has been given trazadone and benadryl and has not been asleep.

## 2023-10-05 NOTE — Progress Notes (Signed)
 Progress Note   Patient: [name] FMW:[id number] DOB: Apr 30, 1941 DOA: 10/03/2023     2 DOS: the patient was seen and examined on 10/05/2023   Brief hospital course:  Brief History: 83 year old Caucasian female with past medical history of essential hypertension, hyperlipidemia, depression, anxiety, hypothyroidism and anemia presents after a mechanical fall while she was in [employer] parking lot.  She was however able to get into her car and was able to drive home but could not get out of the car at home.  EMS was called.  She was brought into the emergency department.  Found to have left hip fracture.  She was hospitalized for further management.   Consultants: Orthopedics   Procedures: None yet   Assessment/Plan:  Left hip fracture status post fracture repair Management per orthopedics.  Orthopedics on board and case discussed Continue as needed pain medication. Continue PT OT PT has recommended skilled nursing facility [facility] on board.  Appreciate input.  Atrial fibrillation with rapid ventricular response Cardiologist on board and case discussed Currently on amiodarone  drip we will continue according to cardiologist recommendation I will DC anticoagulation cardiologist recommendation given recent surgery Continue to monitor on telemetry Follow-up on echocardiogram  Left ankle pain Imaging studies did not show any injuries/fractures.   Hyperlipidemia Continue with statin.   Hypothyroidism Continue with levothyroxine .   Essential hypertension Noted to be on amlodipine  losartan  and metoprolol  prior to admission.  These have been resumed.  Monitor blood pressures closely.   Hypokalemia Continue monitoring with repletion as needed   Thrombocytopenia This appears to be chronic based on previous labs.   DVT Prophylaxis: Definitive prophylaxis after surgery and per orthopedics recommendation Code Status: Full code Family Communication: Discussed with patient.  No  family at bedside Disposition Plan: To be determined   Status is: Inpatient Remains inpatient appropriate because: Left hip fracture     Subjective:  Patient seen and examined at bedside this morning Continues to be on amiodarone  drip Heart rate continues to improve, denies chest pain nausea vomiting abdominal pain  Physical Exam: General appearance: Awake alert.  In no distress Resp: Clear to auscultation bilaterally.  Normal effort Cardio: S1-S2 is normal irregularly irregular mildly tachycardic  GI: Abdomen is soft.  Nontender nondistended.  Bowel sounds are present normal.  No masses organomegaly Extremities: No edema.  Left lower extremity is externally rotated Neurologic: Alert and oriented x3.  No focal neurological deficits.     Vitals:   10/05/23 0200 10/05/23 0409 10/05/23 0800 10/05/23 1200  BP: (!) 133/94 (!) 141/91    Pulse: (!) 110 (!) 115    Resp: 15 (!) 24    Temp:  98.2 F (36.8 C) 98.6 F (37 C) 98 F (36.7 C)  TempSrc:  Oral Oral Oral  SpO2: 95% 97%    Weight:      Height:        Data Reviewed:    Latest Ref Rng & Units 10/05/2023    4:33 AM 10/04/2023    4:11 AM 10/03/2023    4:18 PM  BMP  Glucose 70 - 99 mg/dL 852  88  99   BUN 8 - 23 mg/dL 11  12  15    Creatinine 0.44 - 1.00 mg/dL 9.42  9.52  9.40   Sodium 135 - 145 mmol/L 138  137  138   Potassium 3.5 - 5.1 mmol/L 3.9  3.1  3.4   Chloride 98 - 111 mmol/L 104  103  104  CO2 22 - 32 mmol/L 23  23  23    Calcium  8.9 - 10.3 mg/dL 9.3  9.3  9.6        Latest Ref Rng & Units 10/05/2023    4:33 AM 10/04/2023    4:11 AM 10/03/2023    4:18 PM  CBC  WBC 4.0 - 10.5 K/uL 11.4  8.0  11.7   Hemoglobin 12.0 - 15.0 g/dL 86.6  86.5  84.1   Hematocrit 36.0 - 46.0 % 38.9  38.5  46.8   Platelets 150 - 400 K/uL 101  100  130      Time spent: 38 minutes  Author: Drue ONEIDA Potter, MD 10/05/2023 3:59 PM  For on call review [email or web address].

## 2023-10-05 NOTE — Consult Note (Addendum)
 PHARMACY - ANTICOAGULATION CONSULT NOTE  Pharmacy Consult for Heparin   Indication: atrial fibrillation  Allergies  Allergen Reactions   Codeine Nausea Only    Tolerates oxycodone    Morphine  Nausea And Vomiting   Ace Inhibitors Cough   Adhesive [Tape] Other (See Comments) and Rash    whelps   Patient Measurements: Height: 5' 1 (154.9 cm) Weight: 65 kg (143 lb 4.8 oz) IBW/kg (Calculated) : 47.8 Heparin  Dosing Weight: 61.3 kg   Vital Signs: Temp: 98 F (36.7 C) (01/13 1200) Temp Source: Oral (01/13 1200) BP: 141/91 (01/13 0409) Pulse Rate: 115 (01/13 0409)  Labs: Recent Labs    10/03/23 1618 10/04/23 0411 10/05/23 0433  HGB 15.8* 13.4 13.3  HCT 46.8* 38.5 38.9  PLT 130* 100* 101*  CREATININE 0.59 0.47 0.57   Estimated Creatinine Clearance: 47.6 mL/min (by C-G formula based on SCr of 0.57 mg/dL).  Medical History: Past Medical History:  Diagnosis Date   Allergy    Anemia    Anxiety    Arthritis    Cancer (HCC)    skin cancer   Hyperlipidemia    Hypertension    Hypothyroidism    Sciatica    Sleep apnea    does not use cpap   Thrombocytopenia (HCC)    Thyroid  disease    Medications:  No PTA anticoagulation  Received one dose of prophylactic Lovenox  this morning at 0848   Assessment: [name] is an 83 year old female that presented after falling in a parking lot. X-ray of the left hip revealed an acute nondisplaced intertronchanteric left femur fracture. On 10/04/2023 she underwent surgery with orthopedics for this left hip fracture. Patient went into atrial fibrillation with RVR while in the PACU. She has no past medical history of atrial fibrillation. From chart review, she was not on any anticoagulation prior to admission. Pharmacy has been consulted for initiation and management of a heparin  infusion for atrial fibrillation. Baseline labs: Hgb 13.3, PLT 101, aPTT and INR have been ordered.   CHADSVASc score of 4  Goal of Therapy:  Heparin  level  0.3-0.7 units/ml Monitor platelets by anticoagulation protocol: Yes   Plan:  Give 3000 unit bolus x 1  Start heparin  infusion at 900 units/hr  Check HL 8 hours after initiation of infusion  Patient noted to have thrombocytopenia at baseline; it is stable post-op  Per ortho, okay to start therapeutic heparin  after surgery  Monitor CBC daily and for s/sx of bleeding  Evonnie Nieves, PharmD Pharmacy Resident  10/05/2023 1:39 PM

## 2023-10-05 NOTE — Progress Notes (Signed)
*  PRELIMINARY RESULTS* Echocardiogram 2D Echocardiogram has been performed.  Cristela Blue 10/05/2023, 1:22 PM

## 2023-10-05 NOTE — Progress Notes (Signed)
  Subjective: 1 Day Post-Op Procedure(s) (LRB): INTRAMEDULLARY (IM) NAIL INTERTROCHANTERIC (Left) Patient reports pain as moderate.   Patient is well, and has had no acute complaints or problems Plan is to go Rehab after hospital stay. Negative for chest pain and shortness of breath Fever: no Gastrointestinal:Negative for nausea and vomiting  Objective: Vital signs in last 24 hours: Temp:  [98.1 F (36.7 C)-99.1 F (37.3 C)] 98.2 F (36.8 C) (01/13 0409) Pulse Rate:  [76-132] 115 (01/13 0409) Resp:  [11-24] 24 (01/13 0409) BP: (92-155)/(65-112) 141/91 (01/13 0409) SpO2:  [90 %-99 %] 97 % (01/13 0409)  Intake/Output from previous day:  Intake/Output Summary (Last 24 hours) at 10/05/2023 0700 Last data filed at 10/05/2023 0644 Gross per 24 hour  Intake 1740.25 ml  Output 800 ml  Net 940.25 ml    Intake/Output this shift: Total I/O In: 840.3 [I.V.:640.3; IV Piggyback:200] Out: 500 [Urine:500]  Labs: Recent Labs    10/03/23 1618 10/04/23 0411 10/05/23 0433  HGB 15.8* 13.4 13.3   Recent Labs    10/04/23 0411 10/05/23 0433  WBC 8.0 11.4*  RBC 3.92 3.90  HCT 38.5 38.9  PLT 100* 101*   Recent Labs    10/04/23 0411 10/05/23 0433  NA 137 138  K 3.1* 3.9  CL 103 104  CO2 23 23  BUN 12 11  CREATININE 0.47 0.57  GLUCOSE 88 147*  CALCIUM  9.3 9.3   No results for input(s): LABPT, INR in the last 72 hours.   EXAM General - Patient is Alert and Confused Extremity - Neurovascular intact Sensation intact distally Dorsiflexion/Plantar flexion intact Compartment soft Dressing/Incision - clean, dry, no drainage Motor Function - intact, moving foot and toes well on exam.   Past Medical History:  Diagnosis Date   Allergy    Anemia    Anxiety    Arthritis    Cancer (HCC)    skin cancer   Hyperlipidemia    Hypertension    Hypothyroidism    Sciatica    Sleep apnea    does not use cpap   Thrombocytopenia (HCC)    Thyroid  disease      Assessment/Plan: 1 Day Post-Op Procedure(s) (LRB): INTRAMEDULLARY (IM) NAIL INTERTROCHANTERIC (Left) Principal Problem:   Closed left femoral fracture (HCC) Active Problems:   Hypertension   Anxiety   Psychophysiological insomnia   Acquired hypothyroidism   Hyperlipidemia   RLS (restless legs syndrome)   Overweight with body mass index (BMI) of 26 to 26.9 in adult   Thrombocytopenia (HCC)   Left ankle pain  Estimated body mass index is 27.08 kg/m as calculated from the following:   Height as of this encounter: 5' 1 (1.549 m).   Weight as of this encounter: 65 kg. Advance diet Up with therapy  Discharge planning:  Dressing change as needed.  Follow up at [facility] clinic in 2 weeks for staple removal and xrays of the Left hip.  DVT Prophylaxis - Lovenox  and TED hose Weight-Bearing as tolerated to Left leg  Krystal Doyne, PA-C Orthopaedic Surgery 10/05/2023, 7:00 AM

## 2023-10-06 ENCOUNTER — Other Ambulatory Visit (HOSPITAL_COMMUNITY): Payer: Self-pay

## 2023-10-06 ENCOUNTER — Telehealth (HOSPITAL_COMMUNITY): Payer: Self-pay | Admitting: Pharmacy Technician

## 2023-10-06 ENCOUNTER — Encounter: Payer: Self-pay | Admitting: Surgery

## 2023-10-06 DIAGNOSIS — Z01811 Encounter for preprocedural respiratory examination: Secondary | ICD-10-CM | POA: Diagnosis not present

## 2023-10-06 DIAGNOSIS — S72002A Fracture of unspecified part of neck of left femur, initial encounter for closed fracture: Secondary | ICD-10-CM | POA: Diagnosis not present

## 2023-10-06 DIAGNOSIS — I4891 Unspecified atrial fibrillation: Secondary | ICD-10-CM | POA: Diagnosis not present

## 2023-10-06 LAB — CBC
HCT: 40.4 % (ref 36.0–46.0)
Hemoglobin: 13.8 g/dL (ref 12.0–15.0)
MCH: 33.5 pg (ref 26.0–34.0)
MCHC: 34.2 g/dL (ref 30.0–36.0)
MCV: 98.1 fL (ref 80.0–100.0)
Platelets: 107 10*3/uL — ABNORMAL LOW (ref 150–400)
RBC: 4.12 MIL/uL (ref 3.87–5.11)
RDW: 11.9 % (ref 11.5–15.5)
WBC: 11.6 10*3/uL — ABNORMAL HIGH (ref 4.0–10.5)
nRBC: 0 % (ref 0.0–0.2)

## 2023-10-06 LAB — HEPARIN LEVEL (UNFRACTIONATED)
Heparin Unfractionated: 0.33 [IU]/mL (ref 0.30–0.70)
Heparin Unfractionated: 0.36 [IU]/mL (ref 0.30–0.70)

## 2023-10-06 LAB — BASIC METABOLIC PANEL
Anion gap: 12 (ref 5–15)
BUN: 9 mg/dL (ref 8–23)
CO2: 24 mmol/L (ref 22–32)
Calcium: 8.9 mg/dL (ref 8.9–10.3)
Chloride: 99 mmol/L (ref 98–111)
Creatinine, Ser: 0.55 mg/dL (ref 0.44–1.00)
GFR, Estimated: >60 mL/min (ref >60)
Glucose, Bld: 113 mg/dL — ABNORMAL HIGH (ref 70–99)
Potassium: 2.8 mmol/L — ABNORMAL LOW (ref 3.5–5.1)
Sodium: 135 mmol/L (ref 135–145)

## 2023-10-06 LAB — PHOSPHORUS: Phosphorus: 2.4 mg/dL — ABNORMAL LOW (ref 2.5–4.6)

## 2023-10-06 LAB — MAGNESIUM: Magnesium: 1.9 mg/dL (ref 1.7–2.4)

## 2023-10-06 MED ORDER — METOPROLOL TARTRATE 25 MG PO TABS
12.5000 mg | ORAL_TABLET | ORAL | Status: AC
Start: 2023-10-06 — End: 2023-10-06
  Administered 2023-10-06: 12.5 mg via ORAL
  Filled 2023-10-06: qty 1

## 2023-10-06 MED ORDER — POTASSIUM CHLORIDE CRYS ER 20 MEQ PO TBCR
40.0000 meq | EXTENDED_RELEASE_TABLET | ORAL | Status: AC
  Administered 2023-10-06 (×2): 40 meq via ORAL
  Filled 2023-10-06 (×2): qty 2

## 2023-10-06 MED ORDER — METOPROLOL TARTRATE 25 MG PO TABS
25.0000 mg | ORAL_TABLET | Freq: Four times a day (QID) | ORAL | Status: DC
  Administered 2023-10-06 (×2): 25 mg via ORAL
  Filled 2023-10-06 (×4): qty 1

## 2023-10-06 NOTE — Progress Notes (Signed)
 CRITICAL VALUE STICKER  CRITICAL VALUE: Potassium 2.8  DATE & TIME NOTIFIED: Not notified, found while doing a chart review  MD NOTIFIED: Rosezetta Schlatter MD   TIME OF NOTIFICATION: 4010  RESPONSE:  Awaiting reponse

## 2023-10-06 NOTE — TOC Initial Note (Addendum)
 Transition of Care [facility]) - Initial/Assessment Note    Patient Details  Name: [name] MRN: [id number] Date of Birth: [birth year]  Transition of [facility]) CM/SW Contact:    Lauraine JAYSON Carpen, LCSW Phone Number: 10/06/2023, 2:28 PM  Clinical Narrative:  CSW met with patient. Husband at bedside. CSW introduced role and explained that therapy recommendations would be discussed. Patient and husband are agreeable to SNF placement. Will follow up with bed offers once available. No further concerns. CSW will continue to follow patient and her husband for support and facilitate discharge to SNF once medically stable.                2:37 pm: PASARR under manual review. CSW uploaded requested documents into [place] [employer] for PASARR review.  4:51 pm: PASARR obtained: [id number].  Expected Discharge Plan: Skilled Nursing Facility Barriers to Discharge: Continued Medical Work up   Patient Goals and CMS Choice            Expected Discharge Plan and Services     Post Acute Care Choice: Skilled Nursing Facility Living arrangements for the past 2 months: Single Family Home                                      Prior Living Arrangements/Services Living arrangements for the past 2 months: Single Family Home Lives with:: Spouse Patient language and need for interpreter reviewed:: Yes Do you feel safe going back to the place where you live?: Yes      Need for Family Participation in Patient Care: Yes (Comment) Care giver support system in place?: Yes (comment)   Criminal Activity/Legal Involvement Pertinent to Current Situation/Hospitalization: No - Comment as needed  Activities of Daily Living   ADL Screening (condition at time of admission) Independently performs ADLs?: Yes (appropriate for developmental age) Is the patient deaf or have difficulty hearing?: No Does the patient have difficulty seeing, even when wearing glasses/contacts?: No Does the patient have difficulty  concentrating, remembering, or making decisions?: No  Permission Sought/Granted Permission sought to share information with : Facility Medical Sales Representative, Family Supports Permission granted to share information with : Yes, Verbal Permission Granted  Share Information with NAME: Tyjanae Bartek  Permission granted to share info w AGENCY: SNF's  Permission granted to share info w Relationship: Husband  Permission granted to share info w Contact Information: [phone]  Emotional Assessment Appearance:: Appears stated age Attitude/Demeanor/Rapport: Engaged, Gracious Affect (typically observed): Accepting, Appropriate, Calm, Pleasant Orientation: : Oriented to Self, Oriented to Place, Oriented to  Time, Oriented to Situation (Per last RN assessment, patient only oriented to self. Patient appeared oriented during assessment.) Alcohol  / Substance Use: Not Applicable Psych Involvement: No (comment)  Admission diagnosis:  Pre-op chest exam [Z01.811] Closed left femoral fracture (HCC) [S72.92XA] Closed fracture of left hip, initial encounter (HCC) [S72.002A] Patient Active Problem List   Diagnosis Date Noted   Closed left femoral fracture (HCC) 10/03/2023   Left ankle pain 10/03/2023   Thrombocytopenia (HCC) 06/10/2021   Cognitive impairment 06/10/2021   Aortic atherosclerosis (HCC) 05/08/2021   Overweight with body mass index (BMI) of 26 to 26.9 in adult 03/18/2021   RLS (restless legs syndrome) 01/29/2021   Obstructive chronic bronchitis without exacerbation (HCC) 01/29/2021   Hyperlipidemia 07/09/2020   Hyperparathyroidism (HCC) 04/15/2019   Acquired hypothyroidism 08/05/2018   Anxiety 08/04/2018   Psychophysiological insomnia 08/04/2018   Osteoarthritis 04/14/2018  Hypertension 11/23/2017   PCP:  Antonette Angeline ORN, NP Pharmacy:   [facility] [phone] [address], [place] - [address] AT [facility] [address] [place] [address] [place] [address] Phone:  [phone] Fax: [phone]     Social Drivers of Health (SDOH) Social History: SDOH Screenings   Food Insecurity: No Food Insecurity (10/04/2023)  Housing: Low Risk  (10/04/2023)  Transportation Needs: No Transportation Needs (10/04/2023)  Utilities: Not At Risk (10/04/2023)  Alcohol  Screen: Low Risk  (12/26/2022)  Depression (PHQ2-9): Medium Risk (09/30/2023)  Financial Resource Strain: Low Risk  (05/16/2023)   Received from [facility]  Physical Activity: Inactive (02/03/2023)  Social Connections: Moderately Integrated (10/04/2023)  Stress: No Stress Concern Present (08/18/2023)  Tobacco Use: High Risk (10/03/2023)  Health Literacy: Adequate Health Literacy (05/26/2023)   SDOH Interventions:     Readmission Risk Interventions     No data to display

## 2023-10-06 NOTE — Evaluation (Addendum)
 Occupational Therapy Evaluation Patient Details Name: [name] MRN: [id number] DOB: [birth year] Today's Date: 10/06/2023   History of Present Illness Pt is an 83 y.o. female presenting to hospital 10/03/23 d/t concerns for L leg pain after fall.  Imaging showing acute nondisplaced intertrochanteric L femur fx.  Pt s/p 10/04/23 L intertrochanteric IM nail.  Pt went into afib with RVR in PACU after surgery.  PMH includes htn, HLD, depression, anxiety, anemia, sleep apnea, lower back surgery, B CTR, B knee sx, ORIF distal radial fx L 2020, spinal cord stimulator implant and removal, total knee revision.   Clinical Impression   Chart reviewed to date, pt greeted in bed with B mitts on (mitts re applied after session with appropriate use of BUE throughout evaluation), oriented to self and place, grossly to situation, pleasant and agreeable throughout. Mild impulsivity noted however. Pt reports she is MOD I-I for ADL/IADL, amb with no AD however does have a history of falls. Pt presents with deficits in strength, endurance, activity tolerance, balance, cognition, affecting safe and optimal Adl completion. MAX A required for bed mobility, STS with MOD A +1 with RW. Pt then urinated and required bed level underwear/linen change (with assist from NT). MAX A required for LB dressing with pt able to assist with bridging. Pt will benefit from acute OT to address deficits and to facilitate optimal ADL performance. Pt is left as received, all needs met. OT will follow acuteyl.       If plan is discharge home, recommend the following: A lot of help with bathing/dressing/bathroom;A lot of help with walking and/or transfers;Supervision due to cognitive status    Functional Status Assessment  Patient has had a recent decline in their functional status and demonstrates the ability to make significant improvements in function in a reasonable and predictable amount of time.  Equipment Recommendations  Other  (comment) (defer to next venue of care)    Recommendations for Other Services       Precautions / Restrictions Precautions Precautions: Fall Restrictions Weight Bearing Restrictions Per Provider Order: Yes LLE Weight Bearing Per Provider Order: Weight bearing as tolerated      Mobility Bed Mobility Overal bed mobility: Needs Assistance Bed Mobility: Supine to Sit, Sit to Supine     Supine to sit: Max assist, HOB elevated, Used rails Sit to supine: Max assist, HOB elevated, Used rails        Transfers Overall transfer level: Needs assistance Equipment used: Rolling walker (2 wheels) Transfers: Sit to/from Stand Sit to Stand: Mod assist           General transfer comment: frequent vcs for technique      Balance Overall balance assessment: Needs assistance Sitting-balance support: No upper extremity supported, Feet supported Sitting balance-Leahy Scale: Good     Standing balance support: Bilateral upper extremity supported, Reliant on assistive device for balance Standing balance-Leahy Scale: Poor                             ADL either performed or assessed with clinical judgement   ADL Overall ADL's : Needs assistance/impaired Eating/Feeding: Sitting;Supervision/ safety                   Lower Body Dressing: Maximal assistance Lower Body Dressing Details (indicate cue type and reason): socks, underwear     Toileting- Clothing Manipulation and Hygiene: Maximal assistance;Bed level Toileting - Clothing Manipulation Details (indicate cue type and reason):  doff, donn underwear; pt able to attempt to bridge hips       General ADL Comments: MOD A for STS with RW then pt uncontrolled urination requiring back to bed to get cleaned up     Vision Patient Visual Report: No change from baseline Additional Comments: will continue to assess     Perception         Praxis         Pertinent Vitals/Pain Pain Assessment Pain Assessment:  Faces Faces Pain Scale: Hurts little more Pain Location: L hip/thigh Pain Descriptors / Indicators: Aching, Sore, Tender Pain Intervention(s): Monitored during session, Repositioned     Extremity/Trunk Assessment Upper Extremity Assessment Upper Extremity Assessment: Overall WFL for tasks assessed   Lower Extremity Assessment Lower Extremity Assessment: Defer to PT evaluation   Cervical / Trunk Assessment Cervical / Trunk Assessment: Normal   Communication Communication Communication: Difficulty following commands/understanding Following commands: Follows one step commands with increased time Cueing Techniques: Verbal cues;Visual cues   Cognition Arousal: Alert Behavior During Therapy: WFL for tasks assessed/performed, Impulsive Overall Cognitive Status: Impaired/Different from baseline Area of Impairment: Attention, Memory, Following commands, Safety/judgement, Awareness, Orientation                 Orientation Level: Disoriented to, Time Current Attention Level: Sustained Memory: Decreased short-term memory, Decreased recall of precautions Following Commands: Follows one step commands with increased time Safety/Judgement: Decreased awareness of safety, Decreased awareness of deficits Awareness: Intellectual   General Comments: pleasant and agreeable     General Comments  Two L thigh dressings noted to be off, nurse notified    Exercises Other Exercises Other Exercises: edu re: role of OT, role of rehab, discharge recommendations, delerium prevention (to husband as well)   Shoulder Instructions      Home Living Family/patient expects to be discharged to:: Private residence Living Arrangements: Spouse/significant other Available Help at Discharge: Family;Available PRN/intermittently Type of Home: House Home Access: Ramped entrance     Home Layout: One level     Bathroom Shower/Tub: Chief Strategy Officer: Handicapped height     Home  Equipment: Agricultural Consultant (2 wheels);Rollator (4 wheels);Cane - single point;Wheelchair - manual;Shower seat          Prior Functioning/Environment Prior Level of Function : Independent/Modified Independent;Driving             Mobility Comments: amb no AD          OT Problem List: Decreased activity tolerance;Decreased knowledge of use of DME or AE;Decreased safety awareness;Decreased cognition;Decreased strength;Impaired balance (sitting and/or standing)      OT Treatment/Interventions: Self-care/ADL training;Therapeutic activities;DME and/or AE instruction;Balance training;Therapeutic exercise;Patient/family education    OT Goals(Current goals can be found in the care plan section) Acute Rehab OT Goals Patient Stated Goal: get back to line dancing OT Goal Formulation: With patient Time For Goal Achievement: 10/20/23 Potential to Achieve Goals: Good ADL Goals Pt Will Perform Grooming: with supervision;sitting Pt Will Perform Lower Body Dressing: with supervision;sitting/lateral leans;sit to/from stand Pt Will Transfer to Toilet: with contact guard assist Pt Will Perform Toileting - Clothing Manipulation and hygiene: with modified independence;sit to/from stand;sitting/lateral leans  OT Frequency: Min 1X/week    Co-evaluation              AM-PAC OT 6 Clicks Daily Activity     Outcome Measure Help from another person eating meals?: A Little Help from another person taking care of personal grooming?: A Little Help from another person  toileting, which includes using toliet, bedpan, or urinal?: A Lot Help from another person bathing (including washing, rinsing, drying)?: A Lot Help from another person to put on and taking off regular upper body clothing?: A Lot Help from another person to put on and taking off regular lower body clothing?: A Lot 6 Click Score: 14   End of Session Equipment Utilized During Treatment: Rolling walker (2 wheels) Nurse Communication:  Mobility status  Activity Tolerance: Patient tolerated treatment well Patient left: in bed;with call bell/phone within reach;with bed alarm set  OT Visit Diagnosis: Other abnormalities of gait and mobility (R26.89);Muscle weakness (generalized) (M62.81);Unsteadiness on feet (R26.81)                Time: 8955-8877 OT Time Calculation (min): 38 min Charges:  OT General Charges $OT Visit: 1 Visit OT Evaluation $OT Eval Moderate Complexity: 1 Mod  Therisa Sheffield, OTD OTR/L  10/06/23, 1:09 PM

## 2023-10-06 NOTE — Progress Notes (Signed)
 Physical Therapy Treatment Patient Details Name: [name] MRN: [id number] DOB: Apr 03, 1941 Today's Date: 10/06/2023   History of Present Illness Pt is an 83 y.o. female presenting to hospital 10/03/23 d/t concerns for L leg pain after fall.  Imaging showing acute nondisplaced intertrochanteric L femur fx.  Pt s/p 10/04/23 L intertrochanteric IM nail.  Pt went into afib with RVR in PACU after surgery.  PMH includes htn, HLD, depression, anxiety, anemia, sleep apnea, lower back surgery, B CTR, B knee sx, ORIF distal radial fx L 2020, spinal cord stimulator implant and removal, total knee revision.    PT Comments  Pt resting in bed upon PT arrival; pt agreeable to therapy.  Pt's husband present beginning/end of session (left/outside of room during session).  Mild L hip/thigh pain reported at rest beginning/end of session (pain increased with activity).  During session pt max assist semi-supine to sitting EOB; mod assist to stand from bed; and min assist to ambulate a few feet bed to recliner with RW use.  Pt's HR fluctuating between 97-120 bpm during session.  Improved cognition noted today but pt still demonstrating generalized confusion.  Will continue to focus on strengthening, balance, and progressive functional mobility per pt tolerance.   If plan is discharge home, recommend the following: A lot of help with bathing/dressing/bathroom;Assistance with cooking/housework;Assist for transportation;Help with stairs or ramp for entrance;A lot of help with walking and/or transfers   Can travel by private vehicle     No  Equipment Recommendations  Rolling walker (2 wheels);BSC/3in1;Wheelchair (measurements PT);Wheelchair cushion (measurements PT)    Recommendations for Other Services       Precautions / Restrictions Precautions Precautions: Fall Restrictions Weight Bearing Restrictions Per Provider Order: Yes LLE Weight Bearing Per Provider Order: Weight bearing as tolerated      Mobility  Bed Mobility Overal bed mobility: Needs Assistance Bed Mobility: Supine to Sit     Supine to sit: Max assist, HOB elevated, Used rails     General bed mobility comments: vc's for technique; assist for trunk and B LE's    Transfers Overall transfer level: Needs assistance Equipment used: Rolling walker (2 wheels) Transfers: Sit to/from Stand Sit to Stand: Mod assist           General transfer comment: vc's for technique; assist to initiate and come to full stand; assist to control descent sitting    Ambulation/Gait Ambulation/Gait assistance: Min assist Gait Distance (Feet): 3 Feet (bed to recliner) Assistive device: Rolling walker (2 wheels)   Gait velocity: decreased     General Gait Details: antalgic; decreased stance time L LE; vc's for technique   Stairs             Wheelchair Mobility     Tilt Bed    Modified Rankin (Stroke Patients Only)       Balance Overall balance assessment: Needs assistance Sitting-balance support: No upper extremity supported, Feet supported Sitting balance-Leahy Scale: Good Sitting balance - Comments: steady reaching within BOS   Standing balance support: Bilateral upper extremity supported, Reliant on assistive device for balance Standing balance-Leahy Scale: Fair Standing balance comment: steady static standing with B UE support on RW                            Cognition Arousal: Alert Behavior During Therapy: WFL for tasks assessed/performed, Impulsive Overall Cognitive Status: Impaired/Different from baseline Area of Impairment: Attention, Memory, Following commands, Safety/judgement, Awareness, Orientation  Orientation Level: Disoriented to, Time (but pt able to read off of board) Current Attention Level: Sustained Memory: Decreased short-term memory, Decreased recall of precautions Following Commands: Follows one step commands with increased  time Safety/Judgement: Decreased awareness of safety, Decreased awareness of deficits Awareness: Intellectual   General Comments: Pleasant and participatory        Exercises      General Comments General comments (skin integrity, edema, etc.): Two proximal L thigh dressings noted to be off (nurse notified and aware; plan to keep open to air per nurse).  Nursing cleared pt for participation in physical therapy.  Pt agreeable to PT session.      Pertinent Vitals/Pain Pain Assessment Pain Assessment: Faces Faces Pain Scale:  (2/10 at rest; 6/10 with activity) Pain Location: L hip/thigh Pain Descriptors / Indicators: Aching, Sore, Tender Pain Intervention(s): Limited activity within patient's tolerance, Monitored during session, Repositioned SpO2 sats 96% or greater on room air during session.    Home Living            Prior Function            PT Goals (current goals can now be found in the care plan section) Acute Rehab PT Goals Patient Stated Goal: to improve walking PT Goal Formulation: With patient/family Time For Goal Achievement: 10/19/23 Potential to Achieve Goals: Good Progress towards PT goals: Progressing toward goals    Frequency    7X/week      PT Plan      Co-evaluation              AM-PAC PT 6 Clicks Mobility   Outcome Measure  Help needed turning from your back to your side while in a flat bed without using bedrails?: A Lot Help needed moving from lying on your back to sitting on the side of a flat bed without using bedrails?: A Lot Help needed moving to and from a bed to a chair (including a wheelchair)?: A Little Help needed standing up from a chair using your arms (e.g., wheelchair or bedside chair)?: A Lot Help needed to walk in hospital room?: A Lot Help needed climbing 3-5 steps with a railing? : Total 6 Click Score: 12    End of Session Equipment Utilized During Treatment: Gait belt Activity Tolerance: Patient tolerated  treatment well Patient left: in chair;with call bell/phone within reach;with chair alarm set;with family/visitor present;with SCD's reapplied;Other (comment) (B heels floating via pillow support) Nurse Communication: Mobility status;Precautions;Weight bearing status (Nurse cleared pt to keep B mitts off (trial)) PT Visit Diagnosis: Other abnormalities of gait and mobility (R26.89);Muscle weakness (generalized) (M62.81);History of falling (Z91.81);Pain Pain - Right/Left: Left Pain - part of body: Hip     Time: 8746-8677 PT Time Calculation (min) (ACUTE ONLY): 29 min  Charges:    $Therapeutic Activity: 23-37 mins PT General Charges $$ ACUTE PT VISIT: 1 Visit                     Damien Caulk, PT 10/06/23, 3:46 PM

## 2023-10-06 NOTE — Progress Notes (Addendum)
   Patient Name: [name] Date of Encounter: 10/06/2023 [facility] Cardiologist: None   Interval Summary  .    Patient seen on a.m. rounds.  Pleasant this morning with less confusion.  Continues to have safety mittens on it.  Denies any chest pain, shortness of breath, or palpitations.  Continues to remain in atrial fibrillation on bedside cardiac monitor.  Vital Signs .    Vitals:   10/06/23 0440 10/06/23 0450 10/06/23 0500 10/06/23 0501  BP:      Pulse:  (!) 115 (!) 115   Resp: 13 16 14    Temp:      TempSrc:      SpO2:  100% 100%   Weight:    65.2 kg  Height:        Intake/Output Summary (Last 24 hours) at 10/06/2023 0802 Last data filed at 10/06/2023 0700 Gross per 24 hour  Intake 849.07 ml  Output 2150 ml  Net -1300.93 ml      10/06/2023    5:01 AM 10/03/2023    4:15 PM 09/30/2023    8:26 AM  Last 3 Weights  Weight (lbs) 143 lb 11.8 oz 143 lb 4.8 oz 143 lb 9.6 oz  Weight (kg) 65.2 kg 65 kg 65.137 kg      Telemetry/ECG    Atrial fibrillation with rates of 110-120- Personally Reviewed  Physical Exam .   GEN: No acute distress.   Neck: No JVD Cardiac: IR IR, no murmurs, rubs, or gallops.  Respiratory: Clear to auscultation bilaterally. GI: Soft, nontender, non-distended  MS: No edema  Assessment & Plan .     Postop new onset atrial fibrillation -Was found to be in postoperative atrial fibrillation with RVR -Maintained atrial fibrillation overnight on telemetry monitoring -Continued on amiodarone  drip -Metoprolol  tartrate increased to 25 mg every 6 hours for better rate control -Reached out to orthopedics yesterday with patient being 24 hours out from surgery was considered stable to start anticoagulation -Currently on heparin  infusion but will need to be transition to oral anticoagulant prior to discharge for CHA2DS2-VASc score of at least 4 for stroke prophylaxis -TSH 2.896 with normal T4 -Continued on telemetry monitoring -Echocardiogram  completed with an LVEF 50-55%, mild LVH, mild MR, mild to moderate TR, aortic valve sclerosis without stenosis -Will continue to try to rate control and hopefully she convert spontaneously if not we will need 3 to 4 weeks of uninterrupted oral anticoagulation and then can undergo outpatient DCCV -If continues to have elevated heart rates with up titration of metoprolol  will consider digoxin load  Hypertension -Blood pressure 145/85 -Patient resting with eyes closed -Continued on amlodipine  10 mg daily, losartan  100 mg daily metoprolol  tartrate increased to 25 mg every 6 hours -Vital signs per unit protocol  Hyperlipidemia -Continued on atorvastatin  40 mg daily  Status post left hip repair -Patient states she is experiencing less pain in her surgical site today -Continue management per orthopedics  Delirium -Improved mentation this morning -Continue management per IM  For questions or updates, please contact [facility] Please consult [email or web address] for contact info under        Signed, Aprille Sawhney, NP

## 2023-10-06 NOTE — Progress Notes (Signed)
 Progress Note   Patient: [name] FMW:[id number] DOB: 1941/07/12 DOA: 10/03/2023     3 DOS: the patient was seen and examined on 10/06/2023    Brief hospital course:  Brief History: 83 year old Caucasian female with past medical history of essential hypertension, hyperlipidemia, depression, anxiety, hypothyroidism and anemia presents after a mechanical fall while she was in [employer] parking lot.  She was however able to get into her car and was able to drive home but could not get out of the car at home.  EMS was called.  She was brought into the emergency department.  Found to have left hip fracture.  She was hospitalized for further management.   Consultants: Orthopedics   Procedures: None yet   Assessment/Plan:   Left hip fracture status post fracture repair Management per orthopedics.  Orthopedics on board and case discussed Continue as needed pain medication. Continue PT OT PT has recommended skilled nursing facility [facility] on board.  Appreciate input.   Atrial fibrillation with rapid ventricular response Cardiologist on board and case discussed Currently on amiodarone  drip we will continue according to cardiologist  Continue heparin  with plans to transition to oral anticoagulant as agreed by cardiologist and orthopedics Echo showing EF 50 to 55%   Left ankle pain Imaging studies did not show any injuries/fractures.   Hyperlipidemia Continue with statin.   Hypothyroidism Continue with levothyroxine .   Essential hypertension Noted to be on amlodipine  losartan  and metoprolol  prior to admission.  These have been resumed.  Monitor blood pressures closely.   Hypokalemia Continue monitoring with repletion as needed   Thrombocytopenia This appears to be chronic based on previous labs.   DVT Prophylaxis: Definitive prophylaxis after surgery and per orthopedics recommendation Code Status: Full code Family Communication: Discussed with patient.  No family at  bedside Disposition Plan: To be determined   Status is: Inpatient Remains inpatient appropriate because: Left hip fracture       Subjective:  Patient seen and examined at bedside this morning Patient currently requiring mittens Denies nausea vomiting abdominal pain or chest pain  Physical Exam: General appearance: Awake alert.  In no distress Resp: Clear to auscultation bilaterally.  Normal effort Cardio: S1-S2 is normal irregularly irregular mildly tachycardic  GI: Abdomen is soft.  Nontender nondistended.  Bowel sounds are present normal.  No masses organomegaly Extremities: No edema.  Left lower extremity is externally rotated Neurologic: Alert and oriented x3.  No focal neurological deficits.     Data Reviewed:     Latest Ref Rng & Units 10/06/2023    5:13 AM 10/05/2023    4:33 AM 10/04/2023    4:11 AM  CBC  WBC 4.0 - 10.5 K/uL 11.6  11.4  8.0   Hemoglobin 12.0 - 15.0 g/dL 86.1  86.6  86.5   Hematocrit 36.0 - 46.0 % 40.4  38.9  38.5   Platelets 150 - 400 K/uL 107  101  100        Latest Ref Rng & Units 10/06/2023    5:13 AM 10/05/2023    4:33 AM 10/04/2023    4:11 AM  BMP  Glucose 70 - 99 mg/dL 886  852  88   BUN 8 - 23 mg/dL 9  11  12    Creatinine 0.44 - 1.00 mg/dL 9.44  9.42  9.52   Sodium 135 - 145 mmol/L 135  138  137   Potassium 3.5 - 5.1 mmol/L 2.8  3.9  3.1   Chloride 98 - 111  mmol/L 99  104  103   CO2 22 - 32 mmol/L 24  23  23    Calcium  8.9 - 10.3 mg/dL 8.9  9.3  9.3      Vitals:   10/06/23 0800 10/06/23 0938 10/06/23 1100 10/06/23 1230  BP:   94/73 111/67  Pulse:  (!) 121 (!) 116 92  Resp:   15 13  Temp: 97.9 F (36.6 C)   98.1 F (36.7 C)  TempSrc: Axillary   Oral  SpO2:   91% 99%  Weight:      Height:         Author: Drue ONEIDA Potter, MD 10/06/2023 3:14 PM  For on call review [email or web address].

## 2023-10-06 NOTE — TOC CM/SW Note (Signed)
 RE: [name] Date of Birth: 1941-05-01 Date: 10/06/2023   To Whom It May Concern:  Please be advised that the above-named patient will require a short-term nursing home stay - anticipated 30 days or less for rehabilitation and strengthening.  The plan is for return home.

## 2023-10-06 NOTE — Consult Note (Signed)
 PHARMACY - ANTICOAGULATION CONSULT NOTE  Pharmacy Consult for Heparin   Indication: atrial fibrillation  Allergies  Allergen Reactions   Codeine Nausea Only    Tolerates oxycodone    Morphine  Nausea And Vomiting   Ace Inhibitors Cough   Adhesive [Tape] Other (See Comments) and Rash    whelps   Patient Measurements: Height: 5' 1 (154.9 cm) Weight: 65.2 kg (143 lb 11.8 oz) IBW/kg (Calculated) : 47.8 Heparin  Dosing Weight: 61.3 kg   Vital Signs: Temp: 97.9 F (36.6 C) (01/14 0800) Temp Source: Axillary (01/14 0800) BP: 145/85 (01/14 0340) Pulse Rate: 121 (01/14 0938)  Labs: Recent Labs    10/04/23 0411 10/05/23 0433 10/05/23 1416 10/05/23 2354 10/06/23 0513 10/06/23 0916  HGB 13.4 13.3  --   --  13.8  --   HCT 38.5 38.9  --   --  40.4  --   PLT 100* 101*  --   --  107*  --   APTT  --   --  35  --   --   --   LABPROT  --   --  14.6  --   --   --   INR  --   --  1.1  --   --   --   HEPARINUNFRC  --   --   --  0.36  --  0.33  CREATININE 0.47 0.57  --   --  0.55  --    Estimated Creatinine Clearance: 47.7 mL/min (by C-G formula based on SCr of 0.55 mg/dL).  Medical History: Past Medical History:  Diagnosis Date   Allergy    Anemia    Anxiety    Arthritis    Cancer (HCC)    skin cancer   Hyperlipidemia    Hypertension    Hypothyroidism    Sciatica    Sleep apnea    does not use cpap   Thrombocytopenia (HCC)    Thyroid  disease    Medications:  No PTA anticoagulation  Received one dose of prophylactic Lovenox  this morning at 0848   Assessment: [name] is an 83 year old female that presented after falling in a parking lot. X-ray of the left hip revealed an acute nondisplaced intertronchanteric left femur fracture. On 10/04/2023 she underwent surgery with orthopedics for this left hip fracture. Patient went into atrial fibrillation with RVR while in the PACU. She has no past medical history of atrial fibrillation. From chart review, she was not on any  anticoagulation prior to admission. Pharmacy has been consulted for initiation and management of a heparin  infusion for atrial fibrillation. Baseline labs: Hgb 13.3, PLT 101, aPTT and INR have been ordered.   CHADSVASc score of 4  Goal of Therapy:  Heparin  level 0.3-0.7 units/ml Monitor platelets by anticoagulation protocol: Yes   Date                HL       Interpretation       Heparin  Rate                  01/13  2354     0.36     Therapeutic x 1     900 units/hr  01/14  0916     0.33     Therapeutic x 2     900 units/hr   HL therapeutic x 2  Continue heparin  at current rate of 900 units/hr  Check next HL tomorrow morning with AM labs  Patient noted to  have thrombocytopenia at baseline; it is stable post-op  Per ortho, okay to start therapeutic heparin  after surgery  Monitor CBC daily and for s/sx of bleeding  Evonnie Nieves, PharmD Pharmacy Resident  10/06/2023 10:04 AM

## 2023-10-06 NOTE — Consult Note (Signed)
 PHARMACY - ANTICOAGULATION CONSULT NOTE  Pharmacy Consult for Heparin   Indication: atrial fibrillation  Allergies  Allergen Reactions   Codeine Nausea Only    Tolerates oxycodone    Morphine  Nausea And Vomiting   Ace Inhibitors Cough   Adhesive [Tape] Other (See Comments) and Rash    whelps   Patient Measurements: Height: 5' 1 (154.9 cm) Weight: 65 kg (143 lb 4.8 oz) IBW/kg (Calculated) : 47.8 Heparin  Dosing Weight: 61.3 kg   Vital Signs: Temp: 98.6 F (37 C) (01/13 1940) Temp Source: Oral (01/13 1940) BP: 132/72 (01/13 1940) Pulse Rate: 112 (01/13 1940)  Labs: Recent Labs    10/03/23 1618 10/04/23 0411 10/05/23 0433 10/05/23 1416 10/05/23 2354  HGB 15.8* 13.4 13.3  --   --   HCT 46.8* 38.5 38.9  --   --   PLT 130* 100* 101*  --   --   APTT  --   --   --  35  --   LABPROT  --   --   --  14.6  --   INR  --   --   --  1.1  --   HEPARINUNFRC  --   --   --   --  0.36  CREATININE 0.59 0.47 0.57  --   --    Estimated Creatinine Clearance: 47.6 mL/min (by C-G formula based on SCr of 0.57 mg/dL).  Medical History: Past Medical History:  Diagnosis Date   Allergy    Anemia    Anxiety    Arthritis    Cancer (HCC)    skin cancer   Hyperlipidemia    Hypertension    Hypothyroidism    Sciatica    Sleep apnea    does not use cpap   Thrombocytopenia (HCC)    Thyroid  disease    Medications:  No PTA anticoagulation  Received one dose of prophylactic Lovenox  this morning at 0848   Assessment: [name] is an 83 year old female that presented after falling in a parking lot. X-ray of the left hip revealed an acute nondisplaced intertronchanteric left femur fracture. On 10/04/2023 she underwent surgery with orthopedics for this left hip fracture. Patient went into atrial fibrillation with RVR while in the PACU. She has no past medical history of atrial fibrillation. From chart review, she was not on any anticoagulation prior to admission. Pharmacy has been consulted  for initiation and management of a heparin  infusion for atrial fibrillation. Baseline labs: Hgb 13.3, PLT 101, aPTT and INR have been ordered.   CHADSVASc score of 4  Goal of Therapy:  Heparin  level 0.3-0.7 units/ml Monitor platelets by anticoagulation protocol: Yes   Plan:  1/13:  HL @ 2354 = 0.36, therapeutic X 1 - will continue pt on current rate and recheck HL on 1/14 @ 0800.   Patient noted to have thrombocytopenia at baseline; it is stable post-op  Per ortho, okay to start therapeutic heparin  after surgery  Monitor CBC daily and for s/sx of bleeding  Katlynne Mckercher D, PharmD 10/06/2023 12:49 AM

## 2023-10-06 NOTE — NC FL2 (Signed)
 [facility]  MEDICAID FL2 LEVEL OF CARE FORM     IDENTIFICATION  Patient Name: [name] Birthdate: 1941-02-13 Sex: female Admission Date (Current Location): 10/03/2023  [facility] and [place] Number:  Chiropodist and Address:  [facility], [address] [place], [place] [address]      Provider Number: [id number]  Attending Physician Name and Address:  Dorinda Drue DASEN, MD  Relative Name and Phone Number:       Current Level of Care: Hospital Recommended Level of Care: Skilled Nursing Facility Prior Approval Number:    Date Approved/Denied:   PASRR Number: Manual review  Discharge Plan: SNF    Current Diagnoses: Patient Active Problem List   Diagnosis Date Noted   Closed left femoral fracture (HCC) 10/03/2023   Left ankle pain 10/03/2023   Thrombocytopenia (HCC) 06/10/2021   Cognitive impairment 06/10/2021   Aortic atherosclerosis (HCC) 05/08/2021   Overweight with body mass index (BMI) of 26 to 26.9 in adult 03/18/2021   RLS (restless legs syndrome) 01/29/2021   Obstructive chronic bronchitis without exacerbation (HCC) 01/29/2021   Hyperlipidemia 07/09/2020   Hyperparathyroidism (HCC) 04/15/2019   Acquired hypothyroidism 08/05/2018   Anxiety 08/04/2018   Psychophysiological insomnia 08/04/2018   Osteoarthritis 04/14/2018   Hypertension 11/23/2017    Orientation RESPIRATION BLADDER Height & Weight     Self, Time, Situation, Place (Per RN, only oriented to self. During this social worker's assessment, she seemed oriented.)  Normal Incontinent Weight: 143 lb 11.8 oz (65.2 kg) Height:  5' 1 (154.9 cm)  BEHAVIORAL SYMPTOMS/MOOD NEUROLOGICAL BOWEL NUTRITION STATUS   (None)  (None) Continent Diet  AMBULATORY STATUS COMMUNICATION OF NEEDS Skin   Limited Assist Verbally Surgical wounds (Incision on left leg: Honeycomb dressing.)                       Personal Care Assistance Level of Assistance  Bathing, Feeding,  Dressing Bathing Assistance: Maximum assistance Feeding assistance: Limited assistance Dressing Assistance: Maximum assistance     Functional Limitations Info  Sight, Hearing, Speech Sight Info: Adequate Hearing Info: Adequate Speech Info: Adequate    SPECIAL CARE FACTORS FREQUENCY  PT (By licensed PT), OT (By licensed OT)     PT Frequency: 5 x week OT Frequency: 5 x week            Contractures Contractures Info: Not present    Additional Factors Info  Code Status, Allergies Code Status Info: Full code Allergies Info: Codeine, Morphine , Ace Inhibitors, Adhesive (Tape).           Current Medications (10/06/2023):  This is the current hospital active medication list Current Facility-Administered Medications  Medication Dose Route Frequency Provider Last Rate Last Admin   acetaminophen  (TYLENOL ) tablet 325-650 mg  325-650 mg Oral Q6H PRN Poggi, John J, MD       amiodarone  (NEXTERONE  PREMIX) 360-4.14 MG/200ML-% (1.8 mg/mL) IV infusion  30 mg/hr Intravenous Continuous Krishnan, Gokul, MD 16.67 mL/hr at 10/06/23 1200 30 mg/hr at 10/06/23 1200   amLODipine  (NORVASC ) tablet 10 mg  10 mg Oral Daily Poggi, Norleen PARAS, MD   10 mg at 10/06/23 9060   atorvastatin  (LIPITOR) tablet 40 mg  40 mg Oral QHS Poggi, John J, MD   40 mg at 10/05/23 2139   bisacodyl  (DULCOLAX) suppository 10 mg  10 mg Rectal Daily PRN Poggi, John J, MD       citalopram  (CELEXA ) tablet 20 mg  20 mg Oral Daily Poggi, John  J, MD   20 mg at 10/06/23 9060   diphenhydrAMINE  (BENADRYL ) 12.5 MG/5ML elixir 12.5-25 mg  12.5-25 mg Oral Q4H PRN Poggi, John J, MD   25 mg at 10/05/23 0147   docusate sodium  (COLACE) capsule 100 mg  100 mg Oral BID Poggi, John J, MD   100 mg at 10/06/23 9060   feeding supplement (ENSURE ENLIVE / ENSURE PLUS) liquid 237 mL  237 mL Oral BID BM Dorinda Homans T, MD   237 mL at 10/06/23 1408   heparin  ADULT infusion 100 units/mL (25000 units/250mL)  900 Units/hr Intravenous Continuous Meegan, Eryn, RPH 9  mL/hr at 10/06/23 1200 900 Units/hr at 10/06/23 1200   hydrALAZINE  (APRESOLINE ) injection 5 mg  5 mg Intravenous Q6H PRN Poggi, John J, MD       HYDROcodone -acetaminophen  (NORCO/VICODIN) 5-325 MG per tablet 1 tablet  1 tablet Oral Q6H PRN Poggi, John J, MD   1 tablet at 10/06/23 0342   HYDROmorphone  (DILAUDID ) injection 0.5 mg  0.5 mg Intravenous Q4H PRN Poggi, John J, MD       levothyroxine  (SYNTHROID ) tablet 150 mcg  150 mcg Oral Once per day on Monday Tuesday Wednesday Thursday Friday Saturday Edie Norleen PARAS, MD   150 mcg at 10/06/23 9351   losartan  (COZAAR ) tablet 100 mg  100 mg Oral Daily Poggi, Norleen PARAS, MD   100 mg at 10/06/23 9060   magnesium  hydroxide (MILK OF MAGNESIA) suspension 30 mL  30 mL Oral Daily PRN Poggi, John J, MD       melatonin tablet 10 mg  10 mg Oral QHS Djan, Prince T, MD   10 mg at 10/05/23 2140   metoCLOPramide  (REGLAN ) tablet 5-10 mg  5-10 mg Oral Q8H PRN Poggi, John J, MD       Or   metoCLOPramide  (REGLAN ) injection 5-10 mg  5-10 mg Intravenous Q8H PRN Poggi, John J, MD       metoprolol  tartrate (LOPRESSOR ) injection 2.5 mg  2.5 mg Intravenous Q6H PRN Krishnan, Gokul, MD       metoprolol  tartrate (LOPRESSOR ) tablet 25 mg  25 mg Oral Q6H Hammock, Sheri, NP   25 mg at 10/06/23 1408   multivitamin with minerals tablet 1 tablet  1 tablet Oral Daily Dorinda Homans DASEN, MD   1 tablet at 10/06/23 9061   ondansetron  (ZOFRAN ) tablet 4 mg  4 mg Oral Q6H PRN Poggi, John J, MD       Or   ondansetron  (ZOFRAN ) injection 4 mg  4 mg Intravenous Q6H PRN Poggi, John J, MD       polyethylene glycol (MIRALAX  / GLYCOLAX ) packet 17 g  17 g Oral Daily Poggi, Norleen PARAS, MD   17 g at 10/06/23 0939   senna-docusate (Senokot-S) tablet 2 tablet  2 tablet Oral QHS Edie Norleen PARAS, MD   2 tablet at 10/05/23 2142   sodium phosphate  (FLEET) enema 1 enema  1 enema Rectal Once PRN Poggi, Norleen PARAS, MD       traZODone  (DESYREL ) tablet 50 mg  50 mg Oral QHS PRN Poggi, John J, MD   50 mg at 10/05/23 2142      Discharge Medications: Please see discharge summary for a list of discharge medications.  Relevant Imaging Results:  Relevant Lab Results:   Additional Information SS#: [id number]  Lauraine JAYSON Carpen, LCSW

## 2023-10-06 NOTE — Progress Notes (Signed)
  Subjective: 2 Days Post-Op Procedure(s) (LRB): INTRAMEDULLARY (IM) NAIL INTERTROCHANTERIC (Left) Patient reports no pain this morning in her left leg.  Patient is well, and has had no acute complaints or problems Plan is to go Rehab after hospital stay. Negative for chest pain and shortness of breath.  Currently on tele for A. Fib. Fever: no Gastrointestinal:Negative for nausea and vomiting  Objective: Vital signs in last 24 hours: Temp:  [97.9 F (36.6 C)-98.6 F (37 C)] 97.9 F (36.6 C) (01/14 0040) Pulse Rate:  [90-131] 115 (01/14 0500) Resp:  [8-36] 14 (01/14 0500) BP: (105-152)/(58-85) 145/85 (01/14 0340) SpO2:  [65 %-100 %] 100 % (01/14 0500) Weight:  [65.2 kg] 65.2 kg (01/14 0501)  Intake/Output from previous day:  Intake/Output Summary (Last 24 hours) at 10/06/2023 0708 Last data filed at 10/06/2023 0527 Gross per 24 hour  Intake 560.61 ml  Output 2150 ml  Net -1589.39 ml    Intake/Output this shift: No intake/output data recorded.  Labs: Recent Labs    10/03/23 1618 10/04/23 0411 10/05/23 0433 10/06/23 0513  HGB 15.8* 13.4 13.3 13.8   Recent Labs    10/05/23 0433 10/06/23 0513  WBC 11.4* 11.6*  RBC 3.90 4.12  HCT 38.9 40.4  PLT 101* 107*   Recent Labs    10/05/23 0433 10/06/23 0513  NA 138 135  K 3.9 2.8*  CL 104 99  CO2 23 24  BUN 11 9  CREATININE 0.57 0.55  GLUCOSE 147* 113*  CALCIUM  9.3 8.9   Recent Labs    10/05/23 1416  INR 1.1   EXAM General - Patient is Alert and Confused Extremity - ABD soft Neurovascular intact Sensation intact distally Dorsiflexion/Plantar flexion intact No cellulitis present Compartment soft Dressing/Incision - clean, dry, no drainage noted to the incision sites.  The proximal honeycomb has been removed but wound is dry without any drainage noted. Motor Function - intact, moving foot and toes well on exam.   Past Medical History:  Diagnosis Date   Allergy    Anemia    Anxiety    Arthritis     Cancer (HCC)    skin cancer   Hyperlipidemia    Hypertension    Hypothyroidism    Sciatica    Sleep apnea    does not use cpap   Thrombocytopenia (HCC)    Thyroid  disease     Assessment/Plan: 2 Days Post-Op Procedure(s) (LRB): INTRAMEDULLARY (IM) NAIL INTERTROCHANTERIC (Left) Principal Problem:   Closed left femoral fracture (HCC) Active Problems:   Hypertension   Anxiety   Psychophysiological insomnia   Acquired hypothyroidism   Hyperlipidemia   RLS (restless legs syndrome)   Overweight with body mass index (BMI) of 26 to 26.9 in adult   Thrombocytopenia (HCC)   Left ankle pain  Estimated body mass index is 27.16 kg/m as calculated from the following:   Height as of this encounter: 5' 1 (1.549 m).   Weight as of this encounter: 65.2 kg. Advance diet Up with therapy  Labs and vitals revewed. HR 115, being followed for A fib. WBC 11.6 this AM, Hg 13.8. Up with therapy. Continue to work on a BM.  Discharge planning:  Dressing change as needed.  Staples can be removed by SNF on 10/19/23.  Follow-up with [facility] in 6 weeks for x-rays of the left femur.  DVT Prophylaxis - Lovenox  and TED hose Weight-Bearing as tolerated to Left leg  J. Gustavo Level, PA-C, CAQ-OS Orthopaedic Surgery 10/06/2023, 7:08 AM

## 2023-10-06 NOTE — Telephone Encounter (Signed)
 Patient Product/process development scientist completed.    The patient is insured through [employer]/ [employer]. Patient has Medicare and is not eligible for a copay card, but may be able to apply for patient assistance or Medicare RX Payment Plan (Patient Must reach out to their plan, if eligible for payment plan), if available.    Ran test claim for Eliquis 5 mg and the current 30 day co-pay is $47.00.  Ran test claim for Xarelto 20 mg and the current 30 day co-pay is $47.00.  This test claim was processed through [facility]- copay amounts may vary at other pharmacies due to pharmacy/plan contracts, or as the patient moves through the different stages of their insurance plan.     Roland Earl, CPHT Pharmacy Technician III Certified Patient Advocate [facility] Pharmacy Patient Advocate Team Direct Number: [phone]  Fax: [phone]

## 2023-10-07 DIAGNOSIS — D696 Thrombocytopenia, unspecified: Secondary | ICD-10-CM

## 2023-10-07 DIAGNOSIS — S72002A Fracture of unspecified part of neck of left femur, initial encounter for closed fracture: Secondary | ICD-10-CM | POA: Diagnosis not present

## 2023-10-07 DIAGNOSIS — F419 Anxiety disorder, unspecified: Secondary | ICD-10-CM

## 2023-10-07 DIAGNOSIS — I4891 Unspecified atrial fibrillation: Secondary | ICD-10-CM | POA: Diagnosis not present

## 2023-10-07 DIAGNOSIS — Z01811 Encounter for preprocedural respiratory examination: Secondary | ICD-10-CM | POA: Diagnosis not present

## 2023-10-07 DIAGNOSIS — I10 Essential (primary) hypertension: Secondary | ICD-10-CM | POA: Diagnosis not present

## 2023-10-07 LAB — CBC
HCT: 37.4 % (ref 36.0–46.0)
Hemoglobin: 12.7 g/dL (ref 12.0–15.0)
MCH: 34 pg (ref 26.0–34.0)
MCHC: 34 g/dL (ref 30.0–36.0)
MCV: 100.3 fL — ABNORMAL HIGH (ref 80.0–100.0)
Platelets: 97 10*3/uL — ABNORMAL LOW (ref 150–400)
RBC: 3.73 MIL/uL — ABNORMAL LOW (ref 3.87–5.11)
RDW: 12 % (ref 11.5–15.5)
WBC: 8.2 10*3/uL (ref 4.0–10.5)
nRBC: 0 % (ref 0.0–0.2)

## 2023-10-07 LAB — MAGNESIUM: Magnesium: 2 mg/dL (ref 1.7–2.4)

## 2023-10-07 LAB — HEPARIN LEVEL (UNFRACTIONATED)
Heparin Unfractionated: 0.27 [IU]/mL — ABNORMAL LOW (ref 0.30–0.70)
Heparin Unfractionated: 0.47 [IU]/mL (ref 0.30–0.70)
Heparin Unfractionated: 0.18 [IU]/mL — ABNORMAL LOW (ref 0.30–0.70)

## 2023-10-07 LAB — BASIC METABOLIC PANEL
Anion gap: 9 (ref 5–15)
BUN: 18 mg/dL (ref 8–23)
CO2: 23 mmol/L (ref 22–32)
Calcium: 9 mg/dL (ref 8.9–10.3)
Chloride: 106 mmol/L (ref 98–111)
Creatinine, Ser: 0.71 mg/dL (ref 0.44–1.00)
GFR, Estimated: >60 mL/min (ref >60)
Glucose, Bld: 90 mg/dL (ref 70–99)
Potassium: 3.9 mmol/L (ref 3.5–5.1)
Sodium: 138 mmol/L (ref 135–145)

## 2023-10-07 LAB — PHOSPHORUS: Phosphorus: 2.8 mg/dL (ref 2.5–4.6)

## 2023-10-07 MED ORDER — SODIUM CHLORIDE 0.9% FLUSH: 3.0000 mL | INTRAVENOUS | Status: DC | PRN

## 2023-10-07 MED ORDER — METOPROLOL TARTRATE 25 MG PO TABS
37.5000 mg | ORAL_TABLET | Freq: Four times a day (QID) | ORAL | Status: DC
  Administered 2023-10-07 (×2): 37.5 mg via ORAL
  Filled 2023-10-07 (×2): qty 2

## 2023-10-07 MED ORDER — HEPARIN BOLUS VIA INFUSION
900.0000 [IU] | Freq: Once | INTRAVENOUS | Status: AC
  Administered 2023-10-07: 900 [IU] via INTRAVENOUS
  Filled 2023-10-07: qty 900

## 2023-10-07 MED ORDER — SODIUM CHLORIDE 0.9% FLUSH
3.0000 mL | Freq: Two times a day (BID) | INTRAVENOUS | Status: DC
  Administered 2023-10-07: 10 mL via INTRAVENOUS
  Administered 2023-10-08: 3 mL via INTRAVENOUS

## 2023-10-07 MED ORDER — ORAL CARE MOUTH RINSE: 15.0000 mL | OROMUCOSAL | Status: DC | PRN

## 2023-10-07 MED ORDER — HEPARIN BOLUS VIA INFUSION
1800.0000 [IU] | Freq: Once | INTRAVENOUS | Status: AC
  Administered 2023-10-07: 1800 [IU] via INTRAVENOUS
  Filled 2023-10-07: qty 1800

## 2023-10-07 NOTE — Consult Note (Signed)
 PHARMACY - ANTICOAGULATION CONSULT NOTE  Pharmacy Consult for Heparin   Indication: atrial fibrillation  Allergies  Allergen Reactions   Codeine Nausea Only    Tolerates oxycodone    Morphine  Nausea And Vomiting   Ace Inhibitors Cough   Adhesive [Tape] Other (See Comments) and Rash    whelps   Patient Measurements: Height: 5\' 1"  (154.9 cm) Weight: 65.2 kg (143 lb 11.8 oz) IBW/kg (Calculated) : 47.8 Heparin  Dosing Weight: 61.3 kg   Vital Signs: Temp: 98.1 F (36.7 C) (01/15 0300) Temp Source: Axillary (01/15 0300) BP: 110/74 (01/15 0400) Pulse Rate: 96 (01/15 0400)  Labs: Recent Labs    10/05/23 0433 10/05/23 1416 10/05/23 2354 10/06/23 0513 10/06/23 0916 10/07/23 0345  HGB 13.3  --   --  13.8  --   --   HCT 38.9  --   --  40.4  --   --   PLT 101*  --   --  107*  --   --   APTT  --  35  --   --   --   --   LABPROT  --  14.6  --   --   --   --   INR  --  1.1  --   --   --   --   HEPARINUNFRC  --   --  0.36  --  0.33 0.27*  CREATININE 0.57  --   --  0.55  --  0.71   Estimated Creatinine Clearance: 47.7 mL/min (by C-G formula based on SCr of 0.71 mg/dL).  Medical History: Past Medical History:  Diagnosis Date   Allergy    Anemia    Anxiety    Arthritis    Cancer (HCC)    skin cancer   Hyperlipidemia    Hypertension    Hypothyroidism    Sciatica    Sleep apnea    does not use cpap   Thrombocytopenia (HCC)    Thyroid  disease    Medications:  No PTA anticoagulation  Received one dose of prophylactic Lovenox  this morning at 0848   Assessment: [name] is an 83 year old female that presented after falling in a parking lot. X-ray of the left hip revealed an acute nondisplaced intertronchanteric left femur fracture. On 10/04/2023 she underwent surgery with orthopedics for this left hip fracture. Patient went into atrial fibrillation with RVR while in the PACU. She has no past medical history of atrial fibrillation. From chart review, she was not on any  anticoagulation prior to admission. Pharmacy has been consulted for initiation and management of a heparin  infusion for atrial fibrillation. Baseline labs: Hgb 13.3, PLT 101, aPTT and INR have been ordered.   CHADSVASc score of 4  Goal of Therapy:  Heparin  level 0.3-0.7 units/ml Monitor platelets by anticoagulation protocol: Yes   Date                HL       Interpretation       Heparin  Rate                  01/13  2354     0.36     Therapeutic x 1     900 units/hr  01/14  0916     0.33     Therapeutic x 2     900 units/hr  01/15  0345     0.27     SUBtherapeutic     900 units/hr    1/15:  HL @ 0345 = 0.27, SUBtherapeutic - Will order heparin  900 units IV X 1 bolus and increase heparin  drip rate to 1100 units/hr - Will recheck HL 8 hrs after rate change   Patient noted to have thrombocytopenia at baseline; it is stable post-op  Per ortho, okay to start therapeutic heparin  after surgery  Monitor CBC daily and for s/sx of bleeding  Juddson Cobern D, PharmD 10/07/2023 5:01 AM

## 2023-10-07 NOTE — Consult Note (Signed)
 PHARMACY - ANTICOAGULATION CONSULT NOTE  Pharmacy Consult for Heparin   Indication: atrial fibrillation  Allergies  Allergen Reactions   Codeine Nausea Only    Tolerates oxycodone    Morphine  Nausea And Vomiting   Ace Inhibitors Cough   Adhesive [Tape] Other (See Comments) and Rash    whelps   Patient Measurements: Height: 5\' 1"  (154.9 cm) Weight: 66.1 kg (145 lb 11.6 oz) IBW/kg (Calculated) : 47.8 Heparin  Dosing Weight: 61.3 kg   Vital Signs: Temp: 98.2 F (36.8 C) (01/15 1100) Temp Source: Oral (01/15 1100) BP: 101/74 (01/15 1100) Pulse Rate: 131 (01/15 1100)  Labs: Recent Labs    10/05/23 0433 10/05/23 1416 10/05/23 2354 10/06/23 0513 10/06/23 0916 10/07/23 0345 10/07/23 1254  HGB 13.3  --   --  13.8  --  12.7  --   HCT 38.9  --   --  40.4  --  37.4  --   PLT 101*  --   --  107*  --  97*  --   APTT  --  35  --   --   --   --   --   LABPROT  --  14.6  --   --   --   --   --   INR  --  1.1  --   --   --   --   --   HEPARINUNFRC  --   --    < >  --  0.33 0.27* 0.47  CREATININE 0.57  --   --  0.55  --  0.71  --    < > = values in this interval not displayed.   Estimated Creatinine Clearance: 48 mL/min (by C-G formula based on SCr of 0.71 mg/dL).  Medical History: Past Medical History:  Diagnosis Date   Allergy    Anemia    Anxiety    Arthritis    Cancer (HCC)    skin cancer   Hyperlipidemia    Hypertension    Hypothyroidism    Sciatica    Sleep apnea    does not use cpap   Thrombocytopenia (HCC)    Thyroid  disease    Medications:  No PTA anticoagulation  Received one dose of prophylactic Lovenox  this morning at 0848   Assessment: [name] is an 83 year old female that presented after falling in a parking lot. X-ray of the left hip revealed an acute nondisplaced intertronchanteric left femur fracture. On 10/04/2023 she underwent surgery with orthopedics for this left hip fracture. Patient went into atrial fibrillation with RVR while in the  PACU. She has no past medical history of atrial fibrillation. From chart review, she was not on any anticoagulation prior to admission. Pharmacy has been consulted for initiation and management of a heparin  infusion for atrial fibrillation. Baseline labs: Hgb 13.3, PLT 101, aPTT and INR have been ordered.   CHADSVASc score of 4  Goal of Therapy:  Heparin  level 0.3-0.7 units/ml Monitor platelets by anticoagulation protocol: Yes   Date                HL       Interpretation       Heparin  Rate                  01/13  2354     0.36     Therapeutic x 1     900 units/hr  01/14  0916     0.33     Therapeutic  x 2     900 units/hr  01/15  0345     0.27     SUBtherapeutic     900 units/hr 01/15 1254 0.47 Therapeutic        1100 units/hr  Plan: HL back with therapeutic range. Will continue heparin  infusion at 1100 units per hour. Repeat HL in 8hrs to confirm. Patient noted to have thrombocytopenia at baseline; it is stable post-op  Per ortho, okay to start therapeutic heparin  after surgery  Monitor CBC daily and for s/sx of bleeding  [name] PharmD, BCPS 10/07/2023 1:54 PM

## 2023-10-07 NOTE — Progress Notes (Signed)
   Patient Name: IVRY DANESE Date of Encounter: 10/07/2023 [facility] Cardiologist: None   Interval Summary  .    Patient seen on AM rounds. Denies any chest pain or shortness of breath. Remains on Amiodarone  and Heparin  drips. Currently in atrial fibrillation and flutter on bedside telemetry monitoring. No overnight events recorded. Continues to express pain from surgical site.  Vital Signs .    Vitals:   10/07/23 0400 10/07/23 0502 10/07/23 0608 10/07/23 0700  BP: 110/74  105/76 119/86  Pulse: 96   94  Resp: 13   14  Temp:    97.7 F (36.5 C)  TempSrc:    Oral  SpO2: 95%   93%  Weight:  66.1 kg    Height:        Intake/Output Summary (Last 24 hours) at 10/07/2023 0854 Last data filed at 10/07/2023 0600 Gross per 24 hour  Intake 591.62 ml  Output 400 ml  Net 191.62 ml      10/07/2023    5:02 AM 10/06/2023    5:01 AM 10/03/2023    4:15 PM  Last 3 Weights  Weight (lbs) 145 lb 11.6 oz 143 lb 11.8 oz 143 lb 4.8 oz  Weight (kg) 66.1 kg 65.2 kg 65 kg      Telemetry/ECG    Atrial fibrillation 80-100 and burst of atrial flutter 130's - Personally Reviewed  Physical Exam .   GEN: No acute distress.   Neck: No JVD Cardiac: IR IR, no murmurs, rubs, or gallops.  Respiratory: Clear to auscultation bilaterally. Respirations are unlabored at rest on room air GI: Soft, nontender, non-distended  MS: No edema  Assessment & Plan .     Post-op new onset atrial fibrillation -Postoperative atrial fibrillation with RVR -Maintaining atrial fibrillation with bursts of atrial flutter with rates of up to 130 -Amiodarone  drip increased back to 60 mg/h -Metoprolol  titrate increased to 37.5 mg every 6 hours -Continued on heparin  infusion for CHA2DS2-VASc score of at least 4 for stroke prophylaxis -As she is continued to be difficult to rate control she has been scheduled for DCCV tomorrow with Dr. Gollan -She will need to remain n.p.o. after midnight with sips and  chips -Continued on telemetry monitoring -Echocardiogram completed revealed an LVEF of 50-55%, mild LVH, mild MR, mild to moderate TR, and aortic valve sclerosis without stenosis  Informed Consent   Shared Decision Making/Informed Consent The risks (stroke, cardiac arrhythmias rarely resulting in the need for a temporary or permanent pacemaker, skin irritation or burns and complications associated with conscious sedation including aspiration, arrhythmia, respiratory failure and death), benefits (restoration of normal sinus rhythm) and alternatives of a direct current cardioversion were explained in detail to Ms. Loveland and she agrees to proceed.     Hypertension -Blood pressure 120/81 -Increase metoprolol  to tartrate to 37.5 mg every 6 hours, previous amlodipine  discontinued today losartan  discontinued to allow further up titration of rate controlling medications -Vital signs per unit protocol  Hyperlipidemia -Continued on statin therapy  S/p left hip repair -Continued management per orthopedics  Delirium -Improved mentation on rounds this morning -Continues to be managed per IM For questions or updates, please contact [facility] Please consult [email or web address] for contact info under        Signed, Berley Gambrell, NP

## 2023-10-07 NOTE — Progress Notes (Signed)
 PT Cancellation Note  Patient Details Name: [name] MRN: [id number] DOB: [birth year]   Cancelled Treatment:     PT attempt. Pt had just received breakfast tray upon entering room. Pt's cardiac telemetry reading in extreme tachy. While Bolivar Bushman was in room assisting with repositioning in bed to be able to eat breakfast her HR was between 134 bpm and 147 bpm. Iv medications were running. Author will return at a more appropriate time and continue to progress per current POC.      Koleen Perna 10/07/2023, 8:57 AM

## 2023-10-07 NOTE — Progress Notes (Signed)
 Progress Note   Patient: [name] ZOX:[id number] DOB: [birth year] DOA: 10/03/2023     4 DOS: the patient was seen and examined on 10/07/2023    Brief hospital course:  Brief History: 83 year old Caucasian female with past medical history of essential hypertension, hyperlipidemia, depression, anxiety, hypothyroidism and anemia presents after a mechanical fall while she was in [employer] parking lot.  She was however able to get into her car and was able to drive home but could not get out of the car at home.  EMS was called.  She was brought into the emergency department.  Found to have left hip fracture.  She was hospitalized for further management.   Consultants: Orthopedics   Procedures: None yet   Assessment/Plan:   Left hip fracture status post fracture repair Management per orthopedics.  Orthopedics on board and case discussed Continue as needed pain medication. Continue PT OT PT has recommended skilled nursing facility Case discussed with [facility] manager   Atrial fibrillation with rapid ventricular response Cardiologist on board and case discussed Currently on amiodarone  drip we will continue according to cardiologist  Continue heparin  with plans to transition to oral anticoagulant as agreed by cardiologist and orthopedics According to cardiologist if heart rate still remains uncontrolled by tomorrow they will consider DCCV Echo showing EF 50 to 55%   Left ankle pain Imaging studies did not show any injuries/fractures.   Hyperlipidemia Continue with statin.   Hypothyroidism Continue with levothyroxine .   Essential hypertension Noted to be on amlodipine  losartan  and metoprolol  prior to admission.  These have been resumed.  Monitor blood pressures closely.   Hypokalemia Continue monitoring with repletion as needed   Thrombocytopenia This appears to be chronic based on previous labs.   DVT Prophylaxis: Definitive prophylaxis after surgery and per orthopedics  recommendation Code Status: Full code Family Communication: Discussed with patient.  No family at bedside Disposition Plan: To be determined   Status is: Inpatient Remains inpatient appropriate because: Left hip fracture     Subjective:  Patient seen and examined at bedside this morning Mental status is improved Denies nausea vomiting abdominal pain chest pain cough   Physical Exam: General appearance: Awake alert.  In no distress Resp: Clear to auscultation bilaterally.  Normal effort Cardio: S1-S2 is normal irregularly irregular mildly tachycardic  GI: Abdomen is soft.  Nontender nondistended.  Bowel sounds are present normal.  No masses organomegaly Extremities: No edema.  Left lower extremity is externally rotated Neurologic: Alert and oriented x3.  No focal neurological deficits.      Data Reviewed:      Latest Ref Rng & Units 10/07/2023    3:45 AM 10/06/2023    5:13 AM 10/05/2023    4:33 AM  BMP  Glucose 70 - 99 mg/dL 90  811  914   BUN 8 - 23 mg/dL 18  9  11    Creatinine 0.44 - 1.00 mg/dL 7.82  9.56  2.13   Sodium 135 - 145 mmol/L 138  135  138   Potassium 3.5 - 5.1 mmol/L 3.9  2.8  3.9   Chloride 98 - 111 mmol/L 106  99  104   CO2 22 - 32 mmol/L 23  24  23    Calcium  8.9 - 10.3 mg/dL 9.0  8.9  9.3      Vitals:   10/07/23 1400 10/07/23 1535 10/07/23 1600 10/07/23 1614  BP:  102/73    Pulse:  99    Resp: 18 14  20  Temp:  98.3 F (36.8 C) 98.3 F (36.8 C)   TempSrc:  Oral Oral   SpO2:  100%    Weight:      Height:          Latest Ref Rng & Units 10/07/2023    3:45 AM 10/06/2023    5:13 AM 10/05/2023    4:33 AM  CBC  WBC 4.0 - 10.5 K/uL 8.2  11.6  11.4   Hemoglobin 12.0 - 15.0 g/dL 16.1  09.6  04.5   Hematocrit 36.0 - 46.0 % 37.4  40.4  38.9   Platelets 150 - 400 K/uL 97  107  101      Author: Ezzard Holms, MD 10/07/2023 5:44 PM  For on call review [email or web address].

## 2023-10-07 NOTE — Progress Notes (Signed)
 Subjective: 3 Days Post-Op Procedure(s) (LRB): INTRAMEDULLARY (IM) NAIL INTERTROCHANTERIC (Left) Patient reports no pain this morning in her left leg.  Patient sleeping well when I enter the room. Patient is well, and has had no acute complaints or problems Plan is to go Rehab after hospital stay. Negative for chest pain and shortness of breath.  Currently on tele for A. Fib. Fever: no Gastrointestinal:Negative for nausea and vomiting  Objective: Vital signs in last 24 hours: Temp:  [97.9 F (36.6 C)-98.2 F (36.8 C)] 98.1 F (36.7 C) (01/15 0300) Pulse Rate:  [92-121] 96 (01/15 0400) Resp:  [10-21] 13 (01/15 0400) BP: (82-128)/(67-86) 105/76 (01/15 0608) SpO2:  [91 %-100 %] 95 % (01/15 0400) Weight:  [66.1 kg] 66.1 kg (01/15 0502)  Intake/Output from previous day:  Intake/Output Summary (Last 24 hours) at 10/07/2023 0715 Last data filed at 10/07/2023 0600 Gross per 24 hour  Intake 591.62 ml  Output 400 ml  Net 191.62 ml    Intake/Output this shift: No intake/output data recorded.  Labs: Recent Labs    10/05/23 0433 10/06/23 0513 10/07/23 0345  HGB 13.3 13.8 12.7   Recent Labs    10/06/23 0513 10/07/23 0345  WBC 11.6* 8.2  RBC 4.12 3.73*  HCT 40.4 37.4  PLT 107* 97*   Recent Labs    10/06/23 0513 10/07/23 0345  NA 135 138  K 2.8* 3.9  CL 99 106  CO2 24 23  BUN 9 18  CREATININE 0.55 0.71  GLUCOSE 113* 90  CALCIUM  8.9 9.0   Recent Labs    10/05/23 1416  INR 1.1   EXAM General - Patient is Alert and Confused Extremity - ABD soft Neurovascular intact Sensation intact distally Dorsiflexion/Plantar flexion intact No cellulitis present Compartment soft Dressing/Incision - clean, dry, no drainage noted to the incision sites.  The proximal honeycomb has been removed but wound is dry without any drainage noted. Motor Function - intact, moving foot and toes well on exam.   Past Medical History:  Diagnosis Date   Allergy    Anemia    Anxiety     Arthritis    Cancer (HCC)    skin cancer   Hyperlipidemia    Hypertension    Hypothyroidism    Sciatica    Sleep apnea    does not use cpap   Thrombocytopenia (HCC)    Thyroid  disease     Assessment/Plan: 3 Days Post-Op Procedure(s) (LRB): INTRAMEDULLARY (IM) NAIL INTERTROCHANTERIC (Left) Principal Problem:   Closed left femoral fracture (HCC) Active Problems:   Hypertension   Anxiety   Psychophysiological insomnia   Acquired hypothyroidism   Hyperlipidemia   RLS (restless legs syndrome)   Overweight with body mass index (BMI) of 26 to 26.9 in adult   Thrombocytopenia (HCC)   Left ankle pain   Atrial fibrillation with rapid ventricular response (HCC)  Estimated body mass index is 27.53 kg/m as calculated from the following:   Height as of this encounter: 5\' 1"  (1.549 m).   Weight as of this encounter: 66.1 kg. Advance diet Up with therapy  Labs and vitals revewed. HR 96, being followed for A fib. WBC 8.2 this AM, Hg 12.7. Up with therapy. Continue to work on a BM.  Discharge planning:  Dressing change as needed.  Staples can be removed by SNF on 10/19/23.  Follow-up with [facility] in 6 weeks for x-rays of the left femur.  DVT Prophylaxis - TED hose and Heparin  Weight-Bearing as tolerated to Left leg  Antoine Bathe, PA-C, CAQ-OS Orthopaedic Surgery 10/07/2023, 7:15 AM

## 2023-10-07 NOTE — Consult Note (Signed)
 PHARMACY - ANTICOAGULATION CONSULT NOTE  Pharmacy Consult for Heparin   Indication: atrial fibrillation  Allergies  Allergen Reactions   Codeine Nausea Only    Tolerates oxycodone    Morphine  Nausea And Vomiting   Ace Inhibitors Cough   Adhesive [Tape] Other (See Comments) and Rash    whelps   Patient Measurements: Height: 5\' 1"  (154.9 cm) Weight: 66.1 kg (145 lb 11.6 oz) IBW/kg (Calculated) : 47.8 Heparin  Dosing Weight: 61.3 kg   Vital Signs: Temp: 98.3 F (36.8 C) (01/15 2000) Temp Source: Oral (01/15 2000) BP: 92/56 (01/15 2000) Pulse Rate: 91 (01/15 2000)  Labs: Recent Labs    10/05/23 0433 10/05/23 1416 10/05/23 2354 10/06/23 0513 10/06/23 0916 10/07/23 0345 10/07/23 1254 10/07/23 2116  HGB 13.3  --   --  13.8  --  12.7  --   --   HCT 38.9  --   --  40.4  --  37.4  --   --   PLT 101*  --   --  107*  --  97*  --   --   APTT  --  35  --   --   --   --   --   --   LABPROT  --  14.6  --   --   --   --   --   --   INR  --  1.1  --   --   --   --   --   --   HEPARINUNFRC  --   --    < >  --    < > 0.27* 0.47 0.18*  CREATININE 0.57  --   --  0.55  --  0.71  --   --    < > = values in this interval not displayed.   Estimated Creatinine Clearance: 48 mL/min (by C-G formula based on SCr of 0.71 mg/dL).  Medical History: Past Medical History:  Diagnosis Date   Allergy    Anemia    Anxiety    Arthritis    Cancer (HCC)    skin cancer   Hyperlipidemia    Hypertension    Hypothyroidism    Sciatica    Sleep apnea    does not use cpap   Thrombocytopenia (HCC)    Thyroid  disease    Medications:  No PTA anticoagulation  Received one dose of prophylactic Lovenox  this morning at 0848   Assessment: [name] is an 83 year old female that presented after falling in a parking lot. X-ray of the left hip revealed an acute nondisplaced intertronchanteric left femur fracture. On 10/04/2023 she underwent surgery with orthopedics for this left hip fracture. Patient  went into atrial fibrillation with RVR while in the PACU. She has no past medical history of atrial fibrillation. From chart review, she was not on any anticoagulation prior to admission. Pharmacy has been consulted for initiation and management of a heparin  infusion for atrial fibrillation. Baseline labs: Hgb 13.3, PLT 101, aPTT and INR have been ordered.   CHADSVASc score of 4  Goal of Therapy:  Heparin  level 0.3-0.7 units/ml Monitor platelets by anticoagulation protocol: Yes   Date                HL       Interpretation       Heparin  Rate                  01/13  2354     0.36  Therapeutic x 1     900 units/hr  01/14  0916     0.33     Therapeutic x 2     900 units/hr  01/15  0345     0.27     SUBtherapeutic     900 units/hr 01/15 1254 0.47 Therapeutic        1100 units/hr 01/15   2116     0.18    SUBtherapeutic     1100 units/hr   Plan: 01/15:   HL @ 2116 = 0.18, SUBtherapeutic - Will order heparin  1800 units IV X 1 bolus and increase drip rate to 1300 units/hr - Will recheck HL 8 hrs after rate change  Patient noted to have thrombocytopenia at baseline; it is stable post-op  Per ortho, okay to start therapeutic heparin  after surgery  Monitor CBC daily and for s/sx of bleeding  Lainie Daubert D 10/07/2023 9:44 PM

## 2023-10-07 NOTE — Progress Notes (Addendum)
 Physical Therapy Treatment Patient Details Name: [name] MRN: [id number] DOB: 04/22/1941 Today's Date: 10/07/2023   History of Present Illness Pt is an 83 y.o. female presenting to hospital 10/03/23 d/t concerns for L leg pain after fall.  Imaging showing acute nondisplaced intertrochanteric L femur fx.  Pt s/p 10/04/23 L intertrochanteric IM nail.  Pt went into afib with RVR in PACU after surgery.  PMH includes htn, HLD, depression, anxiety, anemia, sleep apnea, lower back surgery, B CTR, B knee sx, ORIF distal radial fx L 2020, spinal cord stimulator implant and removal, total knee revision.    PT Comments  Pt was alert, long sitting in bed with tele-sitter in room. HR still high but pt cleared to participate from MD/PA. Pt agrees to session and remains cooperative and motivated throughout.  Author questions her awareness to situation and overall insight of her deficits. She puts forth great effort during session but overall remains far from her baseline. Pt requires assistance to safely exit bed, stand to RW, and to take a few antalgic steps to recliner. Extensive vcs for all desired task + increased time. PT remains far from her baseline. Dc recs remain appropriate. Pt will continue to follow.    If plan is discharge home, recommend the following: A lot of help with bathing/dressing/bathroom;Assistance with cooking/housework;Assist for transportation;Help with stairs or ramp for entrance;A lot of help with walking and/or transfers     Equipment Recommendations  Rolling walker (2 wheels);BSC/3in1;Wheelchair (measurements PT);Wheelchair cushion (measurements PT) (if not going to SNF. SNF remains most appropriate DC disposition.)       Precautions / Restrictions Precautions Precautions: Fall Restrictions Weight Bearing Restrictions Per Provider Order: Yes LLE Weight Bearing Per Provider Order: Weight bearing as tolerated     Mobility  Bed Mobility Overal bed mobility: Needs  Assistance Bed Mobility: Supine to Sit  Supine to sit: Mod assist, Used rails, HOB elevated  General bed mobility comments: pt was able to perform OOB to EOB with less assistance today.    Transfers Overall transfer level: Needs assistance Equipment used: Rolling walker (2 wheels) Transfers: Sit to/from Stand Sit to Stand: Mod assist, Min assist, From elevated surface  General transfer comment: min from elevated surface. mod from recliner    Ambulation/Gait Ambulation/Gait assistance: Min assist Gait Distance (Feet): 8 Feet Assistive device: Rolling walker (2 wheels) Gait Pattern/deviations: Step-to pattern, Antalgic Gait velocity: decreased  General Gait Details: Pt ambulated with very slow, antalgic, step to gait. no LOB however needed step by step vcs for sequencing.   Balance Overall balance assessment: Needs assistance Sitting-balance support: No upper extremity supported, Feet supported Sitting balance-Leahy Scale: Good     Standing balance support: Bilateral upper extremity supported, Reliant on assistive device for balance Standing balance-Leahy Scale: Fair Standing balance comment: steady static standing with B UE support on RW     Cognition Arousal: Alert Behavior During Therapy: WFL for tasks assessed/performed, Impulsive Overall Cognitive Status: Impaired/Different from baseline   Orientation Level: Disoriented to, Time Current Attention Level: Sustained Memory: Decreased short-term memory, Decreased recall of precautions Following Commands: Follows one step commands with increased time Safety/Judgement: Decreased awareness of safety, Decreased awareness of deficits Awareness: Intellectual   General Comments: Pleasant and participatory but does have some cognition concerns come to light during session.               Pertinent Vitals/Pain Pain Assessment Pain Assessment: 0-10 Pain Score: 2  Pain Location: L hip/thigh Pain Descriptors / Indicators:  Aching, Sore, Tender Pain Intervention(s): Limited activity within patient's tolerance, Monitored during session, Premedicated before session, Repositioned     PT Goals (current goals can now be found in the care plan section) Acute Rehab PT Goals Patient Stated Goal: "Get stronger so I can go home with my husband who is a BKA." Progress towards PT goals: Progressing toward goals    Frequency    7X/week       AM-PAC PT "6 Clicks" Mobility   Outcome Measure  Help needed turning from your back to your side while in a flat bed without using bedrails?: A Little Help needed moving from lying on your back to sitting on the side of a flat bed without using bedrails?: A Lot Help needed moving to and from a bed to a chair (including a wheelchair)?: A Lot Help needed standing up from a chair using your arms (e.g., wheelchair or bedside chair)?: A Lot Help needed to walk in hospital room?: A Little Help needed climbing 3-5 steps with a railing? : Total 6 Click Score: 13    End of Session Equipment Utilized During Treatment: Gait belt Activity Tolerance: Patient tolerated treatment well   Nurse Communication: Mobility status;Precautions;Weight bearing status PT Visit Diagnosis: Other abnormalities of gait and mobility (R26.89);Muscle weakness (generalized) (M62.81);History of falling (Z91.81);Pain Pain - Right/Left: Left Pain - part of body: Hip     Time: 2130-8657 PT Time Calculation (min) (ACUTE ONLY): 20 min  Charges:    $Therapeutic Activity: 8-22 mins PT General Charges $$ ACUTE PT VISIT: 1 Visit                     Chester Costa PTA 10/07/23, 5:18 PM

## 2023-10-07 NOTE — TOC Progression Note (Signed)
 Transition of Care [facility]) - Progression Note    Patient Details  Name: [name] MRN: [id number] Date of Birth: March 29, 1941  [facility] CM/SW Contact  Odilia Bennett, LCSW Phone Number: 10/07/2023, 2:20 PM  Clinical Narrative:   All local SNF's except [facility] and [facility] have declined. CSW left messages for the admissions coordinators asking them to review.  Expected Discharge Plan: Skilled Nursing Facility Barriers to Discharge: Continued Medical Work up  Expected Discharge Plan and Services     Post Acute Care Choice: Skilled Nursing Facility Living arrangements for the past 2 months: Single Family Home                                       Social Determinants of Health (SDOH) Interventions SDOH Screenings   Food Insecurity: No Food Insecurity (10/04/2023)  Housing: Low Risk  (10/04/2023)  Transportation Needs: No Transportation Needs (10/04/2023)  Utilities: Not At Risk (10/04/2023)  Alcohol  Screen: Low Risk  (12/26/2022)  Depression (PHQ2-9): Medium Risk (09/30/2023)  Financial Resource Strain: Low Risk  (05/16/2023)   Received from [facility]  Physical Activity: Inactive (02/03/2023)  Social Connections: Moderately Integrated (10/04/2023)  Stress: No Stress Concern Present (08/18/2023)  Tobacco Use: High Risk (10/03/2023)  Health Literacy: Adequate Health Literacy (05/26/2023)    Readmission Risk Interventions     No data to display

## 2023-10-07 NOTE — Plan of Care (Signed)

## 2023-10-08 ENCOUNTER — Inpatient Hospital Stay: Payer: PPO | Admitting: Anesthesiology

## 2023-10-08 ENCOUNTER — Encounter: Payer: Self-pay | Admitting: Internal Medicine

## 2023-10-08 ENCOUNTER — Inpatient Hospital Stay: Payer: PPO

## 2023-10-08 ENCOUNTER — Encounter
Admission: EM | Disposition: A | Discharge status: Skilled Nursing Facility | Payer: Self-pay | Source: Home / Self Care | Attending: Internal Medicine

## 2023-10-08 DIAGNOSIS — S72002A Fracture of unspecified part of neck of left femur, initial encounter for closed fracture: Secondary | ICD-10-CM | POA: Diagnosis not present

## 2023-10-08 DIAGNOSIS — Z01811 Encounter for preprocedural respiratory examination: Secondary | ICD-10-CM | POA: Diagnosis not present

## 2023-10-08 DIAGNOSIS — I4891 Unspecified atrial fibrillation: Secondary | ICD-10-CM

## 2023-10-08 DIAGNOSIS — R0602 Shortness of breath: Secondary | ICD-10-CM

## 2023-10-08 DIAGNOSIS — E7800 Pure hypercholesterolemia, unspecified: Secondary | ICD-10-CM

## 2023-10-08 DIAGNOSIS — I10 Essential (primary) hypertension: Secondary | ICD-10-CM | POA: Diagnosis not present

## 2023-10-08 HISTORY — PX: CARDIOVERSION: SHX1299

## 2023-10-08 LAB — GLUCOSE, CAPILLARY
Glucose-Capillary: 243 mg/dL — ABNORMAL HIGH (ref 70–99)
Glucose-Capillary: 237 mg/dL — ABNORMAL HIGH (ref 70–99)

## 2023-10-08 LAB — BASIC METABOLIC PANEL
Anion gap: 10 (ref 5–15)
BUN: 21 mg/dL (ref 8–23)
CO2: 23 mmol/L (ref 22–32)
Calcium: 8.7 mg/dL — ABNORMAL LOW (ref 8.9–10.3)
Chloride: 104 mmol/L (ref 98–111)
Creatinine, Ser: 0.62 mg/dL (ref 0.44–1.00)
GFR, Estimated: >60 mL/min (ref >60)
Glucose, Bld: 105 mg/dL — ABNORMAL HIGH (ref 70–99)
Potassium: 3.5 mmol/L (ref 3.5–5.1)
Sodium: 137 mmol/L (ref 135–145)

## 2023-10-08 LAB — CBC
HCT: 34.2 % — ABNORMAL LOW (ref 36.0–46.0)
Hemoglobin: 11.8 g/dL — ABNORMAL LOW (ref 12.0–15.0)
MCH: 33.8 pg (ref 26.0–34.0)
MCHC: 34.5 g/dL (ref 30.0–36.0)
MCV: 98 fL (ref 80.0–100.0)
Platelets: 105 10*3/uL — ABNORMAL LOW (ref 150–400)
RBC: 3.49 MIL/uL — ABNORMAL LOW (ref 3.87–5.11)
RDW: 12.1 % (ref 11.5–15.5)
WBC: 8.6 10*3/uL (ref 4.0–10.5)
nRBC: 0 % (ref 0.0–0.2)

## 2023-10-08 LAB — MAGNESIUM: Magnesium: 2 mg/dL (ref 1.7–2.4)

## 2023-10-08 LAB — PHOSPHORUS: Phosphorus: 3.7 mg/dL (ref 2.5–4.6)

## 2023-10-08 LAB — HEPARIN LEVEL (UNFRACTIONATED)
Heparin Unfractionated: 0.78 [IU]/mL — ABNORMAL HIGH (ref 0.30–0.70)
Heparin Unfractionated: 0.62 [IU]/mL (ref 0.30–0.70)

## 2023-10-08 SURGERY — CARDIOVERSION
Anesthesia: General

## 2023-10-08 MED ORDER — LIDOCAINE HCL (CARDIAC) PF 100 MG/5ML IV SOSY
PREFILLED_SYRINGE | INTRAVENOUS | Status: DC | PRN
  Administered 2023-10-08: 60 mg via INTRATRACHEAL

## 2023-10-08 MED ORDER — PROPOFOL 10 MG/ML IV BOLUS
INTRAVENOUS | Status: DC | PRN
  Administered 2023-10-08: 30 mg via INTRAVENOUS

## 2023-10-08 MED ORDER — METOPROLOL TARTRATE 25 MG PO TABS
25.0000 mg | ORAL_TABLET | Freq: Two times a day (BID) | ORAL | Status: DC
  Administered 2023-10-08: 25 mg via ORAL
  Filled 2023-10-08: qty 1

## 2023-10-08 MED ORDER — EZETIMIBE 10 MG PO TABS
10.0000 mg | ORAL_TABLET | Freq: Every day | ORAL | Status: DC
  Administered 2023-10-08 – 2023-10-14 (×7): 10 mg via ORAL
  Filled 2023-10-08 (×7): qty 1

## 2023-10-08 MED ORDER — MIDODRINE HCL 5 MG PO TABS
10.0000 mg | ORAL_TABLET | Freq: Once | ORAL | Status: AC
Start: 2023-10-08 — End: 2023-10-08
  Administered 2023-10-08: 10 mg via ORAL
  Filled 2023-10-08: qty 2

## 2023-10-08 MED ORDER — ENSURE ENLIVE PO LIQD
237.0000 mL | Freq: Three times a day (TID) | ORAL | Status: DC
  Administered 2023-10-08 – 2023-10-12 (×10): 237 mL via ORAL

## 2023-10-08 MED ORDER — POTASSIUM CHLORIDE CRYS ER 20 MEQ PO TBCR
40.0000 meq | EXTENDED_RELEASE_TABLET | Freq: Once | ORAL | Status: AC
  Administered 2023-10-08: 40 meq via ORAL
  Filled 2023-10-08: qty 2

## 2023-10-08 NOTE — Consult Note (Signed)
PHARMACY - ANTICOAGULATION CONSULT NOTE  Pharmacy Consult for Heparin  Indication: atrial fibrillation  Allergies  Allergen Reactions   Codeine Nausea Only    Tolerates oxycodone   Morphine Nausea And Vomiting   Ace Inhibitors Cough   Adhesive [Tape] Other (See Comments) and Rash    whelps   Patient Measurements: Height: 5\' 1"  (154.9 cm) Weight: 66.4 kg (146 lb 6.2 oz) IBW/kg (Calculated) : 47.8 Heparin Dosing Weight: 61.3 kg   Vital Signs: Temp: 98.1 F (36.7 C) (01/16 1225) Temp Source: Oral (01/16 1225) BP: 113/49 (01/16 1600) Pulse Rate: 59 (01/16 1600)  Labs: Recent Labs    10/06/23 0513 10/06/23 0916 10/07/23 0345 10/07/23 1254 10/07/23 2116 10/08/23 0613 10/08/23 1749  HGB 13.8  --  12.7  --   --  11.8*  --   HCT 40.4  --  37.4  --   --  34.2*  --   PLT 107*  --  97*  --   --  105*  --   HEPARINUNFRC  --    < > 0.27*   < > 0.18* 0.78* 0.62  CREATININE 0.55  --  0.71  --   --  0.62  --    < > = values in this interval not displayed.   Estimated Creatinine Clearance: 48.1 mL/min (by C-G formula based on SCr of 0.62 mg/dL).  Medical History: Past Medical History:  Diagnosis Date   Allergy    Anemia    Anxiety    Arthritis    Cancer (HCC)    skin cancer   Hyperlipidemia    Hypertension    Hypothyroidism    Sciatica    Sleep apnea    does not use cpap   Thrombocytopenia (HCC)    Thyroid disease    Medications:  No PTA anticoagulation  Received one dose of prophylactic Lovenox this morning at 0848   Assessment: [name] is an 83 year old female that presented after falling in a parking lot. X-ray of the left hip revealed an acute nondisplaced intertronchanteric left femur fracture. On 10/04/2023 she underwent surgery with orthopedics for this left hip fracture. Patient went into atrial fibrillation with RVR while in the PACU. She has no past medical history of atrial fibrillation. From chart review, she was not on any anticoagulation prior  to admission. Pharmacy has been consulted for initiation and management of a heparin infusion for atrial fibrillation. Baseline labs: Hgb 13.3, PLT 101, aPTT and INR have been ordered.   CHADSVASc score of 4  Goal of Therapy:  Heparin level 0.3-0.7 units/ml Monitor platelets by anticoagulation protocol: Yes   Date                HL       Interpretation         Heparin Rate                  01/13  2354     0.36     Therapeutic x 1       900 units/hr  01/14  0916     0.33     Therapeutic x 2       900 units/hr  01/15  0345     0.27     Subtherapeutic       900 units/hr 01/15  1254 0.47  Therapeutic         1100 units/hr 01/15  2116     0.18  Subtherapeutic       1100 units/hr  01/16  0613     0.78     Supratherapeutic    1300 units/hr  01/16 1749 0.62 Therapeutic x 1        1200 units/hr  Plan: HL therapeutic  Continue heparin rate at 1200 units/hr  Check next HL 8 hours to confirm Patient noted to have thrombocytopenia at baseline; it is stable post-op  Hgb slowly down trending yet stable; monitor CBC daily and for s/sx of bleeding F/u transition to PO anticoagulation   Effie Shy, PharmD Pharmacy Resident  10/08/2023 6:42 PM

## 2023-10-08 NOTE — Progress Notes (Signed)
   Patient Name: [name] Date of Encounter: 10/08/2023 [facility] Cardiologist: None   Interval Summary  .    She remains in Afib with ventricular rates in the 80s bpm. No chest pain, dyspnea, palpitations, dizziness, presyncope, or syncope. She is NPO for DCCV today.   Vital Signs .    Vitals:   10/07/23 2343 10/08/23 0400 10/08/23 0504 10/08/23 0800  BP: 96/60 96/69  104/70  Pulse:  83  75  Resp:  14  19  Temp: 98.4 F (36.9 C) 98.3 F (36.8 C)    TempSrc: Oral Oral    SpO2:  97%  99%  Weight:   66.4 kg   Height:        Intake/Output Summary (Last 24 hours) at 10/08/2023 1009 Last data filed at 10/08/2023 0451 Gross per 24 hour  Intake 378.97 ml  Output 350 ml  Net 28.97 ml      10/08/2023    5:04 AM 10/07/2023    5:02 AM 10/06/2023    5:01 AM  Last 3 Weights  Weight (lbs) 146 lb 6.2 oz 145 lb 11.6 oz 143 lb 11.8 oz  Weight (kg) 66.4 kg 66.1 kg 65.2 kg      Telemetry/ECG    Atrial fibrillation 80s bpm - Personally Reviewed  Physical Exam .   GEN: No acute distress.   Neck: No JVD Cardiac: IRIR, no murmurs, rubs, or gallops.  Respiratory: Clear to auscultation bilaterally. Respirations are unlabored at rest on room air GI: Soft, nontender, non-distended  MS: No edema  Assessment & Plan .     Post-op new onset atrial fibrillation -Postoperative atrial fibrillation with RVR -Maintaining atrial fibrillation with rates in the 80s bpm -Remains on amiodarone drip 60 mg/h, transition to oral amiodarone following DCCV for typical 10 gram load -Continue metoprolol 37.5 mg every 6 hours, consolidate post DCCV -Continued on heparin infusion for CHA2DS2-VASc score of at least 4 for stroke prophylaxis, transition to DOAC prior to discharge  -NPO for DCCV today -Continued on telemetry monitoring -Echocardiogram completed revealed an LVEF of 50-55%, mild LVH, mild MR, mild to moderate TR, and aortic valve sclerosis without stenosis  Informed Consent    Shared Decision Making/Informed Consent The risks (stroke, cardiac arrhythmias rarely resulting in the need for a temporary or permanent pacemaker, skin irritation or burns and complications associated with conscious sedation including aspiration, arrhythmia, respiratory failure and death), benefits (restoration of normal sinus rhythm) and alternatives of a direct current cardioversion were explained in detail to Ms. Boardman and she agrees to proceed.     Hypertension -Blood pressure well controlled to slightly soft -Give a one-time dose of midodrine 10 mg this morning per MD -Metoprolol as above, will not decrease at this time given prior difficult to control ventricular rates  Hyperlipidemia -Continued on statin therapy  S/p left hip repair -Continued management per orthopedics  Delirium -Resolved  6. Hypokalemia: -Improved    For questions or updates, please contact  [facility] Please consult [email or web address] for contact info under     Signed, Eula Listen, PA-C

## 2023-10-08 NOTE — Anesthesia Preprocedure Evaluation (Addendum)
Anesthesia Evaluation  Patient identified by MRN, date of birth, ID band Patient confused    Reviewed: Allergy & Precautions, NPO status , Patient's Chart, lab work & pertinent test results  Airway Mallampati: III  TM Distance: <3 FB Neck ROM: full    Dental  (+) Lower Dentures, Upper Dentures   Pulmonary sleep apnea , COPD, Current Smoker and Patient abstained from smoking.   Pulmonary exam normal        Cardiovascular Exercise Tolerance: Good hypertension, (-) angina + dysrhythmias Atrial Fibrillation  Rhythm:Irregular Rate:Normal     Neuro/Psych  PSYCHIATRIC DISORDERS       Neuromuscular disease    GI/Hepatic negative GI ROS, Neg liver ROS,,,  Endo/Other  Hypothyroidism    Renal/GU      Musculoskeletal   Abdominal Normal abdominal exam  (+)   Peds  Hematology negative hematology ROS (+)   Anesthesia Other Findings Past Medical History: No date: Allergy No date: Anemia No date: Anxiety No date: Arthritis No date: Cancer (HCC)     Comment:  skin cancer No date: Hyperlipidemia No date: Hypertension No date: Hypothyroidism No date: Sciatica No date: Sleep apnea     Comment:  does not use cpap No date: Thrombocytopenia (HCC) No date: Thyroid disease  Past Surgical History: No date: ABDOMINAL HYSTERECTOMY No date: APPENDECTOMY No date: BACK SURGERY     Comment:  lower No date: BILATERAL CARPAL TUNNEL RELEASE No date: BREAST CYST ASPIRATION; Left     Comment:  neg No date: COLONOSCOPY 04/23/2021: COLONOSCOPY WITH PROPOFOL; N/A     Comment:  Procedure: COLONOSCOPY WITH PROPOFOL;  Surgeon:               Pasty Spillers, MD;  Location: [facility] ENDOSCOPY;                Service: Endoscopy;  Laterality: N/A; 06/08/2020: ENTROPIAN REPAIR; Left     Comment:  Procedure: ENTROPION REPAIR, SUTURES ENTROPION REPAIR,               EXTENSIVE LEFT;  Surgeon: Imagene Riches, MD;  Location:               [facility];  Service: Ophthalmology;                Laterality: Left; No date: JOINT REPLACEMENT     Comment:  knees No date: KNEE SURGERY; Bilateral No date: KNEE SURGERY 06/23/2019: OPEN REDUCTION INTERNAL FIXATION (ORIF) DISTAL RADIAL  FRACTURE; Left     Comment:  Procedure: OPEN REDUCTION INTERNAL FIXATION (ORIF)               DISTAL RADIAL FRACTURE;  Surgeon: Kennedy Bucker, MD;                Location: [facility] ORS;  Service: Orthopedics;  Laterality:               Left; 04/27/2020: PARATHYROIDECTOMY No date: salivary stone remove No date: SPINAL CORD STIMULATOR IMPLANT; Right 07/28/2022: SPINAL CORD STIMULATOR REMOVAL; N/A     Comment:  Procedure: REMOVAL OF SPINAL CORD STIMULATOR;  Surgeon:               Venetia Night, MD;  Location: [facility] ORS;  Service:               Neurosurgery;  Laterality: N/A; 04/27/2020: THYROIDECTOMY; Right     Comment:  Procedure: PARATHYROIDECTOMY;  Surgeon: Luretha Murphy,  MD;  Location: [facility] ORS;  Service: General;  Laterality:               Right; 08/13/2015: TOTAL KNEE REVISION; Right     Comment:  Procedure: TOTAL KNEE REVISION;  Surgeon: Durene Romans,               MD;  Location: [facility] ORS;  Service: Orthopedics;                Laterality: Right;  BMI    Body Mass Index: 27.08 kg/m      Reproductive/Obstetrics negative OB ROS                              Anesthesia Physical Anesthesia Plan  ASA: 3  Anesthesia Plan: General   Post-op Pain Management:    Induction: Intravenous  PONV Risk Score and Plan: Ondansetron, Dexamethasone, Midazolam and Treatment may vary due to age or medical condition  Airway Management Planned: Natural Airway  Additional Equipment:   Intra-op Plan:   Post-operative Plan:   Informed Consent: I have reviewed the patients History and Physical, chart, labs and discussed the procedure including the risks, benefits and alternatives for the proposed anesthesia with  the patient or authorized representative who has indicated his/her understanding and acceptance.     Dental Advisory Given and Consent reviewed with POA  Plan Discussed with: Anesthesiologist, CRNA and Surgeon  Anesthesia Plan Comments: (Husband )        Anesthesia Quick Evaluation

## 2023-10-08 NOTE — Plan of Care (Signed)

## 2023-10-08 NOTE — CV Procedure (Signed)
Cardioversion procedure note °For atrial fibrillation, persistent ° °Procedure Details: ° °Consent: Risks of procedure as well as the alternatives and risks of each were explained to the (patient/caregiver).  Consent for procedure obtained. ° °Time Out: Verified patient identification, verified procedure, site/side was marked, verified correct patient position, special equipment/implants available, medications/allergies/relevent history reviewed, required imaging and test results available.  Performed ° °Patient placed on cardiac monitor, pulse oximetry, supplemental oxygen as necessary.   °Sedation given: propofol IV, Dr. Johnson °Pacer pads placed anterior and posterior chest. ° ° °Cardioverted 1 time(s).   °Cardioverted at  150 J. Synchronized biphasic °Converted to NSR ° ° °Evaluation: °Findings: Post procedure EKG shows: NSR °Complications: None °Patient did tolerate procedure well. ° °Time Spent Directly with the Patient: ° °45 minutes  ° °[name], M.D., Ph.D.  °

## 2023-10-08 NOTE — Anesthesia Postprocedure Evaluation (Signed)
Anesthesia Post Note  Patient: VESNA PINERA  Procedure(s) Performed: CARDIOVERSION  Patient location during evaluation: PACU Anesthesia Type: General Level of consciousness: awake and alert Pain management: pain level controlled Vital Signs Assessment: post-procedure vital signs reviewed and stable Respiratory status: spontaneous breathing, nonlabored ventilation and respiratory function stable Cardiovascular status: blood pressure returned to baseline and stable Postop Assessment: no apparent nausea or vomiting Anesthetic complications: no   No notable events documented.   Last Vitals:  Vitals:   10/08/23 1426 10/08/23 1500  BP:  (!) 121/53  Pulse: (!) 57 (!) 54  Resp:  14  Temp:    SpO2:  99%    Last Pain:  Vitals:   10/08/23 1426  TempSrc:   PainSc: 0-No pain                 Foye Deer

## 2023-10-08 NOTE — Plan of Care (Signed)
Patient alert to self only most of the time unable to make needs known total care since fall Problem: Education: Goal: Knowledge of General Education information will improve Description: Including pain rating scale, medication(s)/side effects and non-pharmacologic comfort measures Outcome: Not Progressing   Problem: Health Behavior/Discharge Planning: Goal: Ability to manage health-related needs will improve Outcome: Not Progressing   Problem: Clinical Measurements: Goal: Ability to maintain clinical measurements within normal limits will improve Outcome: Not Progressing Goal: Will remain free from infection Outcome: Not Progressing Goal: Diagnostic test results will improve Outcome: Not Progressing Goal: Respiratory complications will improve Outcome: Not Progressing Goal: Cardiovascular complication will be avoided Outcome: Not Progressing   Problem: Activity: Goal: Risk for activity intolerance will decrease Outcome: Not Progressing   Problem: Nutrition: Goal: Adequate nutrition will be maintained Outcome: Not Progressing   Problem: Coping: Goal: Level of anxiety will decrease Outcome: Not Progressing   Problem: Elimination: Goal: Will not experience complications related to bowel motility Outcome: Not Progressing Goal: Will not experience complications related to urinary retention Outcome: Not Progressing   Problem: Pain Management: Goal: General experience of comfort will improve Outcome: Not Progressing   Problem: Safety: Goal: Ability to remain free from injury will improve Outcome: Not Progressing   Problem: Skin Integrity: Goal: Risk for impaired skin integrity will decrease Outcome: Not Progressing

## 2023-10-08 NOTE — Progress Notes (Signed)
Progress Note   Patient: [name] ZOX:[id number] DOB: 1940-12-24 DOA: 10/03/2023     5 DOS: the patient was seen and examined on 10/08/2023      Brief hospital course:  Brief History: 83 year old Caucasian female with past medical history of essential hypertension, hyperlipidemia, depression, anxiety, hypothyroidism and anemia presents after a mechanical fall while she was in [employer] parking lot.  She was however able to get into her car and was able to drive home but could not get out of the car at home.  EMS was called.  She was brought into the emergency department.  Found to have left hip fracture.  She was hospitalized for further management.   Consultants: Orthopedics   Procedures: None yet   Assessment/Plan:   Left hip fracture status post fracture repair Management per orthopedics.  Orthopedics on board and case discussed Continue as needed pain medication. Continue PT OT PT has recommended skilled nursing facility Case discussed with [facility] manager   Fluctuating mental status in the setting of new onset A-fib Concerns of acute illness related delirium Stroke coordinator recommend CT scan of brain for possible underlying stroke given new onset A-fib. No neurological deficits at this time We will follow-up on CT scan findings  Atrial fibrillation with rapid ventricular response Cardiologist on board and case discussed Still on amiodarone drip with plans to transition to oral by cardiologist Continue heparin with plans to transition to oral anticoagulant as agreed by cardiologist and orthopedics Patient taken for DCCV today by cardiologist with successful conversion to normal sinus rhythm Echo showing EF 50 to 55%   Left ankle pain Imaging studies did not show any injuries/fractures.   Hyperlipidemia Continue with statin.   Hypothyroidism Continue with levothyroxine.   Essential hypertension Noted to be on amlodipine losartan and metoprolol prior to  admission.  These have been resumed.  Monitor blood pressures closely.   Hypokalemia Continue monitoring with repletion as needed   Thrombocytopenia This appears to be chronic based on previous labs.   DVT Prophylaxis: Definitive prophylaxis after surgery and per orthopedics recommendation Code Status: Full code Family Communication: Discussed with patient.  No family at bedside Disposition Plan: To be determined   Status is: Inpatient Remains inpatient appropriate because: Left hip fracture     Subjective:  Patient seen and examined at bedside this morning Have mittens taking out today Patient has been having fluctuating mental status post surgery She was able to engage in conversation this morning in the presence of nursing staff Denies nausea vomiting abdominal pain   Physical Exam: General appearance: Awake alert.  In no distress Resp: Clear to auscultation bilaterally.  Normal effort Cardio: S1-S2 is normal irregularly irregular mildly tachycardic  GI: Abdomen is soft.  Nontender nondistended.  Bowel sounds are present normal.  No masses organomegaly Extremities: No edema.  Left lower extremity is externally rotated Neurologic: Alert and oriented x3.  No focal neurological deficits.      Data Reviewed:    Latest Ref Rng & Units 10/08/2023    6:13 AM 10/07/2023    3:45 AM 10/06/2023    5:13 AM  BMP  Glucose 70 - 99 mg/dL 811  90  914   BUN 8 - 23 mg/dL 21  18  9    Creatinine 0.44 - 1.00 mg/dL 7.82  9.56  2.13   Sodium 135 - 145 mmol/L 137  138  135   Potassium 3.5 - 5.1 mmol/L 3.5  3.9  2.8   Chloride  98 - 111 mmol/L 104  106  99   CO2 22 - 32 mmol/L 23  23  24    Calcium 8.9 - 10.3 mg/dL 8.7  9.0  8.9        Latest Ref Rng & Units 10/08/2023    6:13 AM 10/07/2023    3:45 AM 10/06/2023    5:13 AM  CBC  WBC 4.0 - 10.5 K/uL 8.6  8.2  11.6   Hemoglobin 12.0 - 15.0 g/dL 91.4  78.2  95.6   Hematocrit 36.0 - 46.0 % 34.2  37.4  40.4   Platelets 150 - 400 K/uL 105  97   107      Vitals:   10/08/23 1330 10/08/23 1345 10/08/23 1426 10/08/23 1500  BP: 106/61 (!) 111/54  (!) 121/53  Pulse: (!) 55 (!) 51 (!) 57 (!) 54  Resp: 13 14  14   Temp:      TempSrc:      SpO2: 96% 94%  99%  Weight:      Height:         Author: Loyce Dys, MD 10/08/2023 3:37 PM  For on call review [email or web address].

## 2023-10-08 NOTE — Consult Note (Signed)
PHARMACY - ANTICOAGULATION CONSULT NOTE  Pharmacy Consult for Heparin  Indication: atrial fibrillation  Allergies  Allergen Reactions   Codeine Nausea Only    Tolerates oxycodone   Morphine Nausea And Vomiting   Ace Inhibitors Cough   Adhesive [Tape] Other (See Comments) and Rash    whelps   Patient Measurements: Height: 5\' 1"  (154.9 cm) Weight: 66.4 kg (146 lb 6.2 oz) IBW/kg (Calculated) : 47.8 Heparin Dosing Weight: 61.3 kg   Vital Signs: Temp: 98.3 F (36.8 C) (01/16 0400) Temp Source: Oral (01/16 0400) BP: 104/70 (01/16 0800) Pulse Rate: 75 (01/16 0800)  Labs: Recent Labs    10/05/23 1416 10/05/23 2354 10/06/23 0513 10/06/23 0916 10/07/23 0345 10/07/23 1254 10/07/23 2116 10/08/23 0613  HGB  --    < > 13.8  --  12.7  --   --  11.8*  HCT  --   --  40.4  --  37.4  --   --  34.2*  PLT  --   --  107*  --  97*  --   --  105*  APTT 35  --   --   --   --   --   --   --   LABPROT 14.6  --   --   --   --   --   --   --   INR 1.1  --   --   --   --   --   --   --   HEPARINUNFRC  --    < >  --    < > 0.27* 0.47 0.18*  --   CREATININE  --   --  0.55  --  0.71  --   --  0.62   < > = values in this interval not displayed.   Estimated Creatinine Clearance: 48.1 mL/min (by C-G formula based on SCr of 0.62 mg/dL).  Medical History: Past Medical History:  Diagnosis Date   Allergy    Anemia    Anxiety    Arthritis    Cancer (HCC)    skin cancer   Hyperlipidemia    Hypertension    Hypothyroidism    Sciatica    Sleep apnea    does not use cpap   Thrombocytopenia (HCC)    Thyroid disease    Medications:  No PTA anticoagulation  Received one dose of prophylactic Lovenox this morning at 0848   Assessment: [name] is an 83 year old female that presented after falling in a parking lot. X-ray of the left hip revealed an acute nondisplaced intertronchanteric left femur fracture. On 10/04/2023 she underwent surgery with orthopedics for this left hip fracture.  Patient went into atrial fibrillation with RVR while in the PACU. She has no past medical history of atrial fibrillation. From chart review, she was not on any anticoagulation prior to admission. Pharmacy has been consulted for initiation and management of a heparin infusion for atrial fibrillation. Baseline labs: Hgb 13.3, PLT 101, aPTT and INR have been ordered.   CHADSVASc score of 4  Goal of Therapy:  Heparin level 0.3-0.7 units/ml Monitor platelets by anticoagulation protocol: Yes   Date                HL       Interpretation         Heparin Rate                  01/13  2354  0.36     Therapeutic x 1       900 units/hr  01/14  0916     0.33     Therapeutic x 2       900 units/hr  01/15  0345     0.27     Subtherapeutic       900 units/hr 01/15  1254 0.47  Therapeutic         1100 units/hr 01/15  2116     0.18     Subtherapeutic       1100 units/hr  01/16  0613     0.78     Supratherapeutic    1300 units/hr   Plan: HL supratherapeutic  Decrease heparin rate to 1200 units/hr  Check next HL 8 hours after rate change Patient noted to have thrombocytopenia at baseline; it is stable post-op  Hgb slowly down trending yet stable; monitor CBC daily and for s/sx of bleeding F/u transition to PO anticoagulation   Littie Deeds, PharmD Pharmacy Resident  10/08/2023 8:46 AM

## 2023-10-08 NOTE — Transfer of Care (Signed)
Immediate Anesthesia Transfer of Care Note  Patient: [name]  Procedure(s) Performed: CARDIOVERSION  Patient Location: PACU  Anesthesia Type:General  Level of Consciousness: drowsy and patient cooperative  Airway & Oxygen Therapy: Patient Spontanous Breathing and Patient connected to nasal cannula oxygen  Post-op Assessment: Report given to RN, Post -op Vital signs reviewed and stable, and Patient moving all extremities X 4  Post vital signs: Reviewed and stable  Last Vitals:  Vitals Value Taken Time  BP 87/59 10/08/23 1300  Temp    Pulse 55 10/08/23 1300  Resp 16 10/08/23 1300  SpO2 97 % 10/08/23 1300    Last Pain:  Vitals:   10/08/23 1225  TempSrc: Oral  PainSc: 0-No pain      Patients Stated Pain Goal: 0 (10/08/23 0800)  Complications: No notable events documented.

## 2023-10-08 NOTE — Progress Notes (Signed)
Subjective: 4 Days Post-Op Procedure(s) (LRB): INTRAMEDULLARY (IM) NAIL INTERTROCHANTERIC (Left) Patient reports mild to moderate pain this morning in her left leg.  Patient is well, and has had no acute complaints or problems Plan is to go Rehab after hospital stay. Negative for chest pain and shortness of breath.  Currently on tele for A. Fib. Fever: no Gastrointestinal:Negative for nausea and vomiting, still working on BM  Objective: Vital signs in last 24 hours: Temp:  [98.2 F (36.8 C)-98.4 F (36.9 C)] 98.3 F (36.8 C) (01/16 0400) Pulse Rate:  [75-135] 75 (01/16 0800) Resp:  [14-20] 19 (01/16 0800) BP: (92-120)/(56-81) 104/70 (01/16 0800) SpO2:  [95 %-100 %] 99 % (01/16 0800) Weight:  [66.4 kg] 66.4 kg (01/16 0504)  Intake/Output from previous day:  Intake/Output Summary (Last 24 hours) at 10/08/2023 0819 Last data filed at 10/08/2023 0451 Gross per 24 hour  Intake 618.97 ml  Output 350 ml  Net 268.97 ml    Intake/Output this shift: No intake/output data recorded.  Labs: Recent Labs    10/06/23 0513 10/07/23 0345 10/08/23 0613  HGB 13.8 12.7 11.8*   Recent Labs    10/07/23 0345 10/08/23 0613  WBC 8.2 8.6  RBC 3.73* 3.49*  HCT 37.4 34.2*  PLT 97* 105*   Recent Labs    10/07/23 0345 10/08/23 0613  NA 138 137  K 3.9 3.5  CL 106 104  CO2 23 23  BUN 18 21  CREATININE 0.71 0.62  GLUCOSE 90 105*  CALCIUM 9.0 8.7*   Recent Labs    10/05/23 1416  INR 1.1   EXAM General - Patient is Alert and Confused Extremity - ABD soft Neurovascular intact Sensation intact distally Dorsiflexion/Plantar flexion intact No cellulitis present Compartment soft Dressing/Incision - clean, dry, no drainage noted to the incision sites.  Small drainage with dried blood at proximal incision site. New honeycomb applied Motor Function - intact, moving foot and toes well on exam.   Past Medical History:  Diagnosis Date   Allergy    Anemia    Anxiety    Arthritis     Cancer (HCC)    skin cancer   Hyperlipidemia    Hypertension    Hypothyroidism    Sciatica    Sleep apnea    does not use cpap   Thrombocytopenia (HCC)    Thyroid disease     Assessment/Plan: 4 Days Post-Op Procedure(s) (LRB): INTRAMEDULLARY (IM) NAIL INTERTROCHANTERIC (Left) Principal Problem:   Closed left femoral fracture (HCC) Active Problems:   Hypertension   Anxiety   Psychophysiological insomnia   Acquired hypothyroidism   Hyperlipidemia   RLS (restless legs syndrome)   Overweight with body mass index (BMI) of 26 to 26.9 in adult   Thrombocytopenia (HCC)   Left ankle pain   Atrial fibrillation with rapid ventricular response (HCC)   Closed fracture of left hip (HCC)  Estimated body mass index is 27.66 kg/m as calculated from the following:   Height as of this encounter: 5\' 1"  (1.549 m).   Weight as of this encounter: 66.4 kg. Advance diet Up with therapy  Labs and vitals revewed. HR 90 with provider in room, being followed for A fib. WBC 8.6 this AM, Hg 11.8 today. Up with therapy. Continue to work on a BM.  Discharge planning:  Dressing change performed, new honeycomb bandages applied.  Staples can be removed by SNF on 10/19/23.  Follow-up with [facility] in 6 weeks for x-rays of the left femur.  DVT  Prophylaxis - TED hose and Heparin Weight-Bearing as tolerated to Left leg  Danise Edge, PA-C Orthopaedic Surgery 10/08/2023, 8:19 AM

## 2023-10-08 NOTE — Progress Notes (Addendum)
0730 patient alert to self and placed confused about situation plan is for cardioversion today around 1230pm hep gtt and amio gtt running as ordered 0900 all meds given as ordered  1000 hep rate changed per pharmacy order 1210 patient leaving to have cardioversion via bed husband at bedside 1415 patient back from cardioversion in NSR Heart rate 57 alert to self and place 1430 patient placed in chair with alarm and tele sitter 2 person max assist with walker 1620 patient back to bed max assist 1754 patient to be transported to have head CT via bed at this time 1830 patient back from head CT patient keeps asking if we are going to take her picture she doesn't remember just going to CT scan.

## 2023-10-08 NOTE — Progress Notes (Addendum)
PT Cancellation Note  Patient Details Name: [name] MRN: [id number] DOB: 1941-08-19   Cancelled Treatment:     PT attempt. Pt is currently off floor for cardioversion. Acute PT will return at later time and continue to follow per current POC.   Author return at 1700 to attempt session however pt endorses feeling tired from days events and requested [name] return tomorrow. Acute PT will continue to follow and progress as able per current POC.  [name] 10/08/2023, 12:32 PM

## 2023-10-08 NOTE — TOC Progression Note (Signed)
Transition of Care [facility]) - Progression Note    Patient Details  Name: [name] MRN: [id number] Date of Birth: [birth year]  [facility] CM/SW Contact  Margarito Liner, LCSW Phone Number: 10/08/2023, 1:28 PM  Clinical Narrative:   [facility] is considering bed offer depending on behaviors, mentation, and need of telesitter. [facility] has not responded yet. All other SNF's in [facility] have declined.  Expected Discharge Plan: Skilled Nursing Facility Barriers to Discharge: Continued Medical Work up  Expected Discharge Plan and Services     Post Acute Care Choice: Skilled Nursing Facility Living arrangements for the past 2 months: Single Family Home                                       Social Determinants of Health (SDOH) Interventions SDOH Screenings   Food Insecurity: No Food Insecurity (10/04/2023)  Housing: Low Risk  (10/04/2023)  Transportation Needs: No Transportation Needs (10/04/2023)  Utilities: Not At Risk (10/04/2023)  Alcohol Screen: Low Risk  (12/26/2022)  Depression (PHQ2-9): Medium Risk (09/30/2023)  Financial Resource Strain: Low Risk  (05/16/2023)   Received from [facility]  Physical Activity: Inactive (02/03/2023)  Social Connections: Moderately Integrated (10/04/2023)  Stress: No Stress Concern Present (08/18/2023)  Tobacco Use: High Risk (10/08/2023)  Health Literacy: Adequate Health Literacy (05/26/2023)    Readmission Risk Interventions     No data to display

## 2023-10-08 NOTE — Progress Notes (Signed)
Nutrition Follow-up  DOCUMENTATION CODES:   Not applicable  INTERVENTION:   -Once diet is resumed:   -Ensure Enlive po TID, each supplement provides 350 kcal and 20 grams of protein -Magic cup TID with meals, each supplement provides 290 kcal and 9 grams of protein  -MVI with minerals daily -Feeding assistance with meals  NUTRITION DIAGNOSIS:   Inadequate oral intake related to acute illness as evidenced by meal completion < 25%.  Ongoing  GOAL:   Patient will meet greater than or equal to 90% of their needs  Progressing   MONITOR:   PO intake, Supplement acceptance, Labs, Weight trends, I & O's, Skin  REASON FOR ASSESSMENT:   Consult Hip fracture protocol  ASSESSMENT:   83 y/o female with h/o HTN, depression, anxiety, HLD, RLS and parathyroidectomy who is admitted with hip fracture after fall now s/p ORIF 1/12 complicated by Afib and delerium.  1/12- s/p Reduction and internal fixation of intertrochanteric left hip fracture with [employer] TFN nail  Reviewed I/O's: +269 ml x 245 hours and +100 ml since admission  UOP: 350 ml x 24 hours  Per cardiology notes, pt with a-fib since 10/04/23 post-op. Pt is NPO for planned cardioversion today.   Pt sleeping soundly at time of visit. Noted telesitter. No family present. Pt did not arouse to name being called.   Pt currently NPO for cardioversion. She was [previously on a regular diet. Noted meal completions 0-25%. Pt has been drinking Ensure supplements.   Noted last BM documented on 10/03/23. Bowel regimen started on 10/04/23.Pt with PRN fleet enema ordered.   Wt has been stable since admission.   Per TOC notes, plan for SNF placement at discharge.   Medications reviewed and include colace, melatonin, miralax, amiodarone, senokot.   Labs reviewed.    Diet Order:   Diet Order             Diet NPO time specified Except for: Sips with Meds  Diet effective midnight                   EDUCATION NEEDS:    Education needs have been addressed  Skin:  Skin Assessment: Skin Integrity Issues: Skin Integrity Issues:: Incisions Incisions: closed lt leg  Last BM:  10/03/23  Height:   Ht Readings from Last 1 Encounters:  10/03/23 5\' 1"  (1.549 m)    Weight:   Wt Readings from Last 1 Encounters:  10/08/23 66.4 kg    Ideal Body Weight:  47.7 kg  BMI:  Body mass index is 27.66 kg/m.  Estimated Nutritional Needs:   Kcal:  1650-1850  Protein:  85-100 grams  Fluid:  >1.6 L    Levada Schilling, RD, LDN, CDCES Registered Dietitian III Certified Diabetes Care and Education Specialist If unable to reach this RD, please use "RD Inpatient" group chat on secure chat between hours of 8am-4 pm daily

## 2023-10-09 ENCOUNTER — Encounter: Payer: Self-pay | Admitting: Cardiovascular Disease

## 2023-10-09 DIAGNOSIS — Z01811 Encounter for preprocedural respiratory examination: Secondary | ICD-10-CM | POA: Diagnosis not present

## 2023-10-09 DIAGNOSIS — S72002A Fracture of unspecified part of neck of left femur, initial encounter for closed fracture: Secondary | ICD-10-CM | POA: Diagnosis not present

## 2023-10-09 DIAGNOSIS — I4891 Unspecified atrial fibrillation: Secondary | ICD-10-CM | POA: Diagnosis not present

## 2023-10-09 LAB — CBC
HCT: 30.4 % — ABNORMAL LOW (ref 36.0–46.0)
Hemoglobin: 10.5 g/dL — ABNORMAL LOW (ref 12.0–15.0)
MCH: 33.8 pg (ref 26.0–34.0)
MCHC: 34.5 g/dL (ref 30.0–36.0)
MCV: 97.7 fL (ref 80.0–100.0)
Platelets: 100 10*3/uL — ABNORMAL LOW (ref 150–400)
RBC: 3.11 MIL/uL — ABNORMAL LOW (ref 3.87–5.11)
RDW: 12.2 % (ref 11.5–15.5)
WBC: 7.9 10*3/uL (ref 4.0–10.5)
nRBC: 0 % (ref 0.0–0.2)

## 2023-10-09 LAB — BASIC METABOLIC PANEL
Anion gap: 7 (ref 5–15)
BUN: 17 mg/dL (ref 8–23)
CO2: 23 mmol/L (ref 22–32)
Calcium: 8.9 mg/dL (ref 8.9–10.3)
Chloride: 106 mmol/L (ref 98–111)
Creatinine, Ser: 0.68 mg/dL (ref 0.44–1.00)
GFR, Estimated: >60 mL/min (ref >60)
Glucose, Bld: 95 mg/dL (ref 70–99)
Potassium: 4 mmol/L (ref 3.5–5.1)
Sodium: 136 mmol/L (ref 135–145)

## 2023-10-09 LAB — PHOSPHORUS: Phosphorus: 4.1 mg/dL (ref 2.5–4.6)

## 2023-10-09 LAB — HEPARIN LEVEL (UNFRACTIONATED): Heparin Unfractionated: 0.6 [IU]/mL (ref 0.30–0.70)

## 2023-10-09 MED ORDER — AMIODARONE HCL 200 MG PO TABS
400.0000 mg | ORAL_TABLET | Freq: Two times a day (BID) | ORAL | Status: DC
  Administered 2023-10-09: 400 mg via ORAL
  Filled 2023-10-09: qty 2

## 2023-10-09 MED ORDER — APIXABAN 5 MG PO TABS
5.0000 mg | ORAL_TABLET | Freq: Two times a day (BID) | ORAL | Status: DC
  Administered 2023-10-09 – 2023-10-15 (×13): 5 mg via ORAL
  Filled 2023-10-09 (×13): qty 1

## 2023-10-09 MED ORDER — AMIODARONE HCL 200 MG PO TABS
200.0000 mg | ORAL_TABLET | Freq: Every day | ORAL | Status: DC
Start: 2023-10-23 — End: 2023-10-09

## 2023-10-09 MED ORDER — HYDROCODONE-ACETAMINOPHEN 5-325 MG PO TABS: 1.0000 | ORAL_TABLET | Freq: Four times a day (QID) | ORAL | 0 refills | Status: DC | PRN

## 2023-10-09 MED ORDER — AMIODARONE HCL 200 MG PO TABS: 200.0000 mg | ORAL_TABLET | Freq: Two times a day (BID) | ORAL | Status: DC

## 2023-10-09 MED ORDER — AMIODARONE HCL 200 MG PO TABS
200.0000 mg | ORAL_TABLET | Freq: Every day | ORAL | Status: DC
Start: 2023-10-16 — End: 2023-10-15

## 2023-10-09 MED ORDER — METOPROLOL SUCCINATE ER 25 MG PO TB24
25.0000 mg | ORAL_TABLET | Freq: Every day | ORAL | Status: DC
  Administered 2023-10-09 – 2023-10-14 (×6): 25 mg via ORAL
  Filled 2023-10-09 (×7): qty 1

## 2023-10-09 MED ORDER — ENOXAPARIN SODIUM 40 MG/0.4ML IJ SOSY: 40.0000 mg | PREFILLED_SYRINGE | INTRAMUSCULAR | 0 refills | Status: DC

## 2023-10-09 MED ORDER — AMIODARONE HCL 200 MG PO TABS
200.0000 mg | ORAL_TABLET | Freq: Two times a day (BID) | ORAL | Status: DC
  Administered 2023-10-09 – 2023-10-15 (×12): 200 mg via ORAL
  Filled 2023-10-09 (×12): qty 1

## 2023-10-09 NOTE — Plan of Care (Signed)

## 2023-10-09 NOTE — Progress Notes (Signed)
Progress Note   Patient: [name] HQI:[id number] DOB: 1941/05/31 DOA: 10/03/2023     6 DOS: the patient was seen and examined on 10/09/2023     Brief hospital course:  Brief History: 83 year old Caucasian female with past medical history of essential hypertension, hyperlipidemia, depression, anxiety, hypothyroidism and anemia presents after a mechanical fall while she was in [employer] parking lot.  She was however able to get into her car and was able to drive home but could not get out of the car at home.  EMS was called.  She was brought into the emergency department.  Found to have left hip fracture.  She was hospitalized for further management.   Consultants: Orthopedics   Procedures: None yet   Assessment/Plan:   Left hip fracture status post fracture repair Management per orthopedics.  Orthopedics on board and case discussed Continue as needed pain medication. Continue PT OT PT has recommended skilled nursing facility Case discussed with [facility] manager   Fluctuating mental status in the setting of new onset A-fib Concerns of acute illness related delirium Stroke coordinator recommend CT scan of brain for possible underlying stroke given new onset A-fib. No neurological deficits at this time We will follow-up on CT scan findings   Atrial fibrillation with rapid ventricular response Cardiologist on board and case discussed Amiodarone drip transitioned to p.o. amiodarone today Heparin drip has been transitioned to Eliquis by cardiologist Patient taken for DCCV on 10/08/2023 by cardiologist with successful conversion to normal sinus rhythm Echo showing EF 50 to 55%   Left ankle pain Imaging studies did not show any injuries/fractures.   Hyperlipidemia Continue with statin.   Hypothyroidism Continue with levothyroxine.   Essential hypertension Noted to be on amlodipine losartan and metoprolol prior to admission.  These have been resumed.  Monitor blood pressures  closely.   Hypokalemia-improved Continue monitoring with repletion as needed   Thrombocytopenia Continue to monitor closely   DVT Prophylaxis: Definitive prophylaxis after surgery and per orthopedics recommendation Code Status: Full code Family Communication: Discussed with patient.  No family at bedside Disposition Plan: To be determined   Status is: Inpatient Remains inpatient appropriate because: Left hip fracture     Subjective:  Patient seen and examined at bedside this morning Mental status improved I have reviewed patient's CT scan of the brain that did not show any acute intracranial pathology Patient has been weaned off amiodarone drip and has been transitioned to p.o. amiodarone   Physical Exam: General appearance: Awake alert.  In no distress Resp: Clear to auscultation bilaterally.  Normal effort Cardio: S1-S2 is normal irregularly irregular mildly tachycardic  GI: Abdomen is soft.  Nontender nondistended.  Bowel sounds are present normal.  No masses organomegaly Extremities: No edema.  Left lower extremity is externally rotated Neurologic: Alert and oriented x3.  No focal neurological deficits.      Data Reviewed:    Latest Ref Rng & Units 10/09/2023    2:10 AM 10/08/2023    6:13 AM 10/07/2023    3:45 AM  BMP  Glucose 70 - 99 mg/dL 95  132  90   BUN 8 - 23 mg/dL 17  21  18    Creatinine 0.44 - 1.00 mg/dL 4.40  1.02  7.25   Sodium 135 - 145 mmol/L 136  137  138   Potassium 3.5 - 5.1 mmol/L 4.0  3.5  3.9   Chloride 98 - 111 mmol/L 106  104  106   CO2 22 - 32  mmol/L 23  23  23    Calcium 8.9 - 10.3 mg/dL 8.9  8.7  9.0     Vitals:   10/09/23 1148 10/09/23 1416 10/09/23 1419 10/09/23 1708  BP: 108/60 (!) 97/52 (!) 112/54 132/61  Pulse: (!) 57 (!) 53 (!) 56 65  Resp: 12 13 13 19   Temp:      TempSrc:      SpO2: 100% 98% 99% 99%  Weight:      Height:          Latest Ref Rng & Units 10/09/2023    2:10 AM 10/08/2023    6:13 AM 10/07/2023    3:45 AM  CBC   WBC 4.0 - 10.5 K/uL 7.9  8.6  8.2   Hemoglobin 12.0 - 15.0 g/dL 82.9  56.2  13.0   Hematocrit 36.0 - 46.0 % 30.4  34.2  37.4   Platelets 150 - 400 K/uL 100  105  97      Author: Loyce Dys, MD 10/09/2023 6:17 PM  For on call review [email or web address].

## 2023-10-09 NOTE — Progress Notes (Signed)
Physical Therapy Treatment Patient Details Name: [name] MRN: [id number] DOB: Jan 27, 1941 Today's Date: 10/09/2023   History of Present Illness Pt is an 83 y.o. female presenting to hospital 10/03/23 d/t concerns for L leg pain after fall.  Imaging showing acute nondisplaced intertrochanteric L femur fx.  Pt s/p 10/04/23 L intertrochanteric IM nail.  Pt went into afib with RVR in PACU after surgery.  PMH includes htn, HLD, depression, anxiety, anemia, sleep apnea, lower back surgery, B CTR, B knee sx, ORIF distal radial fx L 2020, spinal cord stimulator implant and removal, total knee revision.    PT Comments  Pt was supine in bed upon arrival. She is alert and agreeable to session. Pt was eager to attempt OOB activity. Per chart, BP has been soft throughout most of the day with HR much more controlled since cardioversion. Pt was able to achieve EOB sitting with mod assist + extensive Vcs. She stood EOB 2 x however on 2nd attempt, BP dropped from 103/58 (70) down to 84/52 (63). Pt was symptomatic and c/o dizziness. She requested to sit due to symptoms. MD/RN made aware. Acute PT will continue to follow per current POC. DC recs remain appropriate to maximize her independence and safety with all ADLs.      If plan is discharge home, recommend the following: A lot of help with bathing/dressing/bathroom;Assistance with cooking/housework;Assist for transportation;Help with stairs or ramp for entrance;A lot of help with walking and/or transfers     Equipment Recommendations  Other (comment) (Defer to next level of care)       Precautions / Restrictions Precautions Precautions: Fall Restrictions Weight Bearing Restrictions Per Provider Order: Yes LLE Weight Bearing Per Provider Order: Weight bearing as tolerated     Mobility  Bed Mobility Overal bed mobility: Needs Assistance Bed Mobility: Supine to Sit  Supine to sit: Mod assist, Used rails, HOB elevated Sit to supine: Max assist, HOB  elevated, Used rails General bed mobility comments: sitting EOB BP 103/58(70)    Transfers Overall transfer level: Needs assistance Equipment used: Rolling walker (2 wheels) Transfers: Sit to/from Stand Sit to Stand: Min assist, Mod assist, From elevated surface  General transfer comment: pt stood 2 x EOB. 2nd attempt c/o increased dizziness and requested to sit. BP after standing 2nd time 84/52(63)    Ambulation/Gait  General Gait Details: Unable to progress away from EOB due to pt's hypotension/ c/o dizziness.    Balance Overall balance assessment: Needs assistance Sitting-balance support: No upper extremity supported, Feet supported Sitting balance-Leahy Scale: Good     Standing balance support: Bilateral upper extremity supported, Reliant on assistive device for balance Standing balance-Leahy Scale: Fair Standing balance comment: steady static standing with B UE support on RW      Cognition Arousal: Alert Behavior During Therapy: WFL for tasks assessed/performed, Impulsive Overall Cognitive Status: Impaired/Different from baseline    Following Commands: Follows one step commands with increased time Safety/Judgement: Decreased awareness of safety, Decreased awareness of deficits Awareness: Intellectual   General Comments: Pt is alert and eager for OOB activity. BP soft throughout the day               Pertinent Vitals/Pain Pain Assessment Pain Assessment: 0-10 Pain Score: 2  Pain Location: L hip/thigh Pain Descriptors / Indicators: Aching, Sore, Tender Pain Intervention(s): Limited activity within patient's tolerance, Monitored during session, Premedicated before session, Repositioned     PT Goals (current goals can now be found in the care plan section) Acute Rehab  PT Goals Patient Stated Goal: "Get stronger so I can go home with my husband who is a BKA." Progress towards PT goals: Not progressing toward goals - comment    Frequency    7X/week        AM-PAC PT "6 Clicks" Mobility   Outcome Measure  Help needed turning from your back to your side while in a flat bed without using bedrails?: A Little Help needed moving from lying on your back to sitting on the side of a flat bed without using bedrails?: A Lot Help needed moving to and from a bed to a chair (including a wheelchair)?: A Lot Help needed standing up from a chair using your arms (e.g., wheelchair or bedside chair)?: A Lot Help needed to walk in hospital room?: A Little Help needed climbing 3-5 steps with a railing? : A Lot 6 Click Score: 14    End of Session   Activity Tolerance: Patient limited by fatigue;Treatment limited secondary to medical complications (Comment) (limited by dizziness/ low BP concerns) Patient left: in bed;with call bell/phone within reach;with bed alarm set Nurse Communication: Mobility status;Precautions;Weight bearing status PT Visit Diagnosis: Other abnormalities of gait and mobility (R26.89);Muscle weakness (generalized) (M62.81);History of falling (Z91.81);Pain Pain - Right/Left: Left Pain - part of body: Hip     Time: 1610-9604 PT Time Calculation (min) (ACUTE ONLY): 17 min  Charges:    $Therapeutic Activity: 8-22 mins PT General Charges $$ ACUTE PT VISIT: 1 Visit                     Jetta Lout PTA 10/09/23, 4:27 PM

## 2023-10-09 NOTE — Discharge Instructions (Signed)

## 2023-10-09 NOTE — Consult Note (Signed)
PHARMACY - ANTICOAGULATION CONSULT NOTE  Pharmacy Consult for Heparin  Indication: atrial fibrillation  Allergies  Allergen Reactions   Codeine Nausea Only    Tolerates oxycodone   Morphine Nausea And Vomiting   Ace Inhibitors Cough   Adhesive [Tape] Other (See Comments) and Rash    whelps   Patient Measurements: Height: 5\' 1"  (154.9 cm) Weight: 66.4 kg (146 lb 6.2 oz) IBW/kg (Calculated) : 47.8 Heparin Dosing Weight: 61.3 kg   Vital Signs: Temp: 98.2 F (36.8 C) (01/16 2337) BP: 126/60 (01/16 2337) Pulse Rate: 57 (01/16 2337)  Labs: Recent Labs    10/07/23 0345 10/07/23 1254 10/08/23 0613 10/08/23 1749 10/09/23 0210  HGB 12.7  --  11.8*  --  10.5*  HCT 37.4  --  34.2*  --  30.4*  PLT 97*  --  105*  --  100*  HEPARINUNFRC 0.27*   < > 0.78* 0.62 0.60  CREATININE 0.71  --  0.62  --  0.68   < > = values in this interval not displayed.   Estimated Creatinine Clearance: 48.1 mL/min (by C-G formula based on SCr of 0.68 mg/dL).  Medical History: Past Medical History:  Diagnosis Date   Allergy    Anemia    Anxiety    Arthritis    Cancer (HCC)    skin cancer   Hyperlipidemia    Hypertension    Hypothyroidism    Sciatica    Sleep apnea    does not use cpap   Thrombocytopenia (HCC)    Thyroid disease    Medications:  No PTA anticoagulation  Received one dose of prophylactic Lovenox this morning at 0848   Assessment: [name] is an 83 year old female that presented after falling in a parking lot. X-ray of the left hip revealed an acute nondisplaced intertronchanteric left femur fracture. On 10/04/2023 she underwent surgery with orthopedics for this left hip fracture. Patient went into atrial fibrillation with RVR while in the PACU. She has no past medical history of atrial fibrillation. From chart review, she was not on any anticoagulation prior to admission. Pharmacy has been consulted for initiation and management of a heparin infusion for atrial  fibrillation. Baseline labs: Hgb 13.3, PLT 101, aPTT and INR have been ordered.   CHADSVASc score of 4  Goal of Therapy:  Heparin level 0.3-0.7 units/ml Monitor platelets by anticoagulation protocol: Yes   Date                HL       Interpretation         Heparin Rate                  01/13  2354     0.36     Therapeutic x 1       900 units/hr  01/14  0916     0.33     Therapeutic x 2       900 units/hr  01/15  0345     0.27     Subtherapeutic       900 units/hr 01/15  1254 0.47  Therapeutic         1100 units/hr 01/15  2116     0.18     Subtherapeutic       1100 units/hr  01/16  0613     0.78     Supratherapeutic    1300 units/hr  01/16 1749 0.62 Therapeutic x 1  1200 units/hr 01/17   0210    0.60    Therapeutic x 2        1200 units/hr  Plan: HL therapeutic  Continue heparin rate at 1200 units/hr  Check next HL on 1/18 with AM labs  Patient noted to have thrombocytopenia at baseline; it is stable post-op  Hgb slowly down trending yet stable; monitor CBC daily and for s/sx of bleeding F/u transition to PO anticoagulation   Jasmyne Lodato D, PharmD 10/09/2023 3:11 AM

## 2023-10-09 NOTE — Progress Notes (Signed)
Subjective: POD5 Left femur IM nail placement for intertrochanteric fracture Patient reports mild pain this AM. Patient is well, and has had no acute complaints or problems Plan is to go Rehab after hospital stay. Negative for chest pain and shortness of breath.  Currently on tele for A. Fib. Fever: no Gastrointestinal:Negative for nausea and vomiting, still working on BM  Objective: Vital signs in last 24 hours: Temp:  [98.1 F (36.7 C)-98.3 F (36.8 C)] 98.2 F (36.8 C) (01/16 2337) Pulse Rate:  [51-95] 57 (01/16 2337) Resp:  [12-22] 18 (01/16 2337) BP: (68-126)/(47-75) 126/60 (01/16 2337) SpO2:  [94 %-99 %] 96 % (01/16 2337) Weight:  [66.4 kg-69.8 kg] 69.8 kg (01/17 0500)  Intake/Output from previous day:  Intake/Output Summary (Last 24 hours) at 10/09/2023 0712 Last data filed at 10/09/2023 0645 Gross per 24 hour  Intake 1789.82 ml  Output 1050 ml  Net 739.82 ml    Intake/Output this shift: No intake/output data recorded.  Labs: Recent Labs    10/07/23 0345 10/08/23 0613 10/09/23 0210  HGB 12.7 11.8* 10.5*   Recent Labs    10/08/23 0613 10/09/23 0210  WBC 8.6 7.9  RBC 3.49* 3.11*  HCT 34.2* 30.4*  PLT 105* 100*   Recent Labs    10/08/23 0613 10/09/23 0210  NA 137 136  K 3.5 4.0  CL 104 106  CO2 23 23  BUN 21 17  CREATININE 0.62 0.68  GLUCOSE 105* 95  CALCIUM 8.7* 8.9   No results for input(s): "LABPT", "INR" in the last 72 hours.  EXAM General - Patient is Alert and Confused Extremity - ABD soft Neurovascular intact Sensation intact distally Dorsiflexion/Plantar flexion intact No cellulitis present Compartment soft Dressing/Incision - clean, dry, no drainage noted to the incision sites.  Honeycomb dressings intact this AM. Motor Function - intact, moving foot and toes well on exam.  Abdomen soft with intact bowel sounds.  Past Medical History:  Diagnosis Date   Allergy    Anemia    Anxiety    Arthritis    Cancer (HCC)    skin  cancer   Hyperlipidemia    Hypertension    Hypothyroidism    Sciatica    Sleep apnea    does not use cpap   Thrombocytopenia (HCC)    Thyroid disease     Assessment/Plan: 1 Day Post-Op Procedure(s) (LRB): CARDIOVERSION (N/A) Principal Problem:   Closed left femoral fracture (HCC) Active Problems:   Hypertension   Anxiety   Psychophysiological insomnia   Acquired hypothyroidism   Hyperlipidemia   RLS (restless legs syndrome)   Overweight with body mass index (BMI) of 26 to 26.9 in adult   Thrombocytopenia (HCC)   Left ankle pain   Atrial fibrillation with rapid ventricular response (HCC)   Closed fracture of left hip (HCC)   SOB (shortness of breath)  Estimated body mass index is 29.08 kg/m as calculated from the following:   Height as of this encounter: 5\' 1"  (1.549 m).   Weight as of this encounter: 69.8 kg. Advance diet Up with therapy  Labs and vitals revewed. HR 57, being followed for A fib.  Cardioversion performed yesterday. WBC 7.9 this AM, Hg 10.5 today. Up with therapy. Continue to work on a BM.  Discharge planning:  Cherlynn Polo can be removed by SNF on 10/19/23.  Follow-up with [facility] in 6 weeks for x-rays of the left femur.  Ortho will sign off at this time.  DVT Prophylaxis - TED hose and  Heparin Weight-Bearing as tolerated to Left leg  J. Horris Latino, PA-C, CAQ-OS Orthopaedic Surgery 10/09/2023, 7:12 AM

## 2023-10-09 NOTE — TOC Progression Note (Signed)
Transition of Care [facility]) - Progression Note    Patient Details  Name: [name] MRN: [id number] Date of Birth: 16-Jun-1941  Transition of Care [facility]) CM/SW Contact  Truddie Hidden, RN Phone Number: 10/09/2023, 3:04 PM  Clinical Narrative:    No current bed offers.    Expected Discharge Plan: Skilled Nursing Facility Barriers to Discharge: Continued Medical Work up  Expected Discharge Plan and Services     Post Acute Care Choice: Skilled Nursing Facility Living arrangements for the past 2 months: Single Family Home                                       Social Determinants of Health (SDOH) Interventions SDOH Screenings   Food Insecurity: No Food Insecurity (10/04/2023)  Housing: Low Risk  (10/04/2023)  Transportation Needs: No Transportation Needs (10/04/2023)  Utilities: Not At Risk (10/04/2023)  Alcohol Screen: Low Risk  (12/26/2022)  Depression (PHQ2-9): Medium Risk (09/30/2023)  Financial Resource Strain: Low Risk  (05/16/2023)   Received from [facility]  Physical Activity: Inactive (02/03/2023)  Social Connections: Moderately Integrated (10/04/2023)  Stress: No Stress Concern Present (08/18/2023)  Tobacco Use: High Risk (10/08/2023)  Health Literacy: Adequate Health Literacy (05/26/2023)    Readmission Risk Interventions     No data to display

## 2023-10-09 NOTE — Progress Notes (Signed)
   Patient Name: [name] Date of Encounter: 10/09/2023 [facility] Cardiologist: None   Interval Summary  .    She underwent successful DCCV on 10/03/2023, and remains in sinus rhythm at this time.  No symptoms of angina, dyspnea, palpitations, dizziness, presyncope, or syncope.  Vital Signs .    Vitals:   10/08/23 2006 10/08/23 2337 10/09/23 0500 10/09/23 0819  BP: 118/63 126/60  115/63  Pulse: 64 (!) 57  (!) 59  Resp: 19 18    Temp: 98.3 F (36.8 C) 98.2 F (36.8 C)  98.5 F (36.9 C)  TempSrc:    Oral  SpO2: 95% 96%  95%  Weight:   69.8 kg   Height:        Intake/Output Summary (Last 24 hours) at 10/09/2023 0842 Last data filed at 10/09/2023 0800 Gross per 24 hour  Intake 1846.5 ml  Output 1050 ml  Net 796.5 ml      10/09/2023    5:00 AM 10/08/2023   12:25 PM 10/08/2023    5:04 AM  Last 3 Weights  Weight (lbs) 153 lb 14.1 oz 146 lb 6.2 oz 146 lb 6.2 oz  Weight (kg) 69.8 kg 66.4 kg 66.4 kg      Telemetry/ECG    Sinus bradycardia with rates in the 50s bpm - Personally Reviewed  Physical Exam .   GEN: No acute distress.   Neck: No JVD Cardiac: Bradycardic, no murmurs, rubs, or gallops.  Respiratory: Clear to auscultation bilaterally. Respirations are unlabored at rest on room air GI: Soft, nontender, non-distended  MS: No edema  Assessment & Plan .     Post-op new onset atrial fibrillation: -Postoperative atrial fibrillation with RVR -Status post successful DCCV on 10/08/2023 -Maintaining sinus rhythm with bradycardic rate -Reduce and consolidate Lopressor to Toprol-XL 25 mg once daily -Transition amiodarone drip to oral amiodarone for 400 mg twice daily for 1 week (1/24), followed by 200 mg twice daily for 1 week (1/31), followed by 200 mg daily thereafter -Transition from heparin drip to apixaban 5 mg twice daily, she does not meet reduced dosing criteria -Continued on telemetry monitoring -Echocardiogram completed revealed an LVEF of  50-55%, mild LVH, mild MR, mild to moderate TR, and aortic valve sclerosis without stenosis    Hypertension: -Blood pressure stable -Transition Lopressor to reduced dose Toprol-XL as outlined above  Hyperlipidemia: -Continued on statin therapy  S/p left hip repair: -Continued management per orthopedics  Delirium: -Resolved  6. Hypokalemia: -Resolved    For questions or updates, please contact [facility] Please consult [email or web address] for contact info under     Signed, Eula Listen, PA-C

## 2023-10-10 DIAGNOSIS — Z01811 Encounter for preprocedural respiratory examination: Secondary | ICD-10-CM | POA: Diagnosis not present

## 2023-10-10 DIAGNOSIS — S72002A Fracture of unspecified part of neck of left femur, initial encounter for closed fracture: Secondary | ICD-10-CM | POA: Diagnosis not present

## 2023-10-10 LAB — PHOSPHORUS: Phosphorus: 3.5 mg/dL (ref 2.5–4.6)

## 2023-10-10 NOTE — Progress Notes (Signed)
PT Cancellation Note  Patient Details Name: [name] MRN: [id number] DOB: January 12, 1941   Cancelled Treatment:     PT attempt. Pt refused." I just can't do it today honey. It hurts too bad to move. I just got in a comfortable position." Pt's BP remains overall soft. With her response to activity previous date, encouraged to participate without taking more pain medications. Pt continued to refuse PT at this time. Dc recs remain appropriate. Will continue to follow per current POC.    [name] 10/10/2023, 12:52 PM

## 2023-10-10 NOTE — Plan of Care (Signed)

## 2023-10-10 NOTE — Progress Notes (Signed)
Progress Note   Patient: [name] WUJ:[id number] DOB: 1941-09-09 DOA: 10/03/2023     7 DOS: the patient was seen and examined on 10/10/2023    Brief hospital course:  Brief History: 83 year old Caucasian female with past medical history of essential hypertension, hyperlipidemia, depression, anxiety, hypothyroidism and anemia presents after a mechanical fall while she was in [employer] parking lot.  She was however able to get into her car and was able to drive home but could not get out of the car at home.  EMS was called.  She was brought into the emergency department.  Found to have left hip fracture.  She was hospitalized for further management.   Consultants: Orthopedics   Procedures: None yet   Assessment/Plan:   Left hip fracture status post fracture repair Management per orthopedics.  Orthopedics on board and case discussed Continue as needed pain medication. Continue PT OT PT has recommended skilled nursing facility Case discussed with [facility] manager   Fluctuating mental status in the setting of new onset A-fib Concerns of acute illness related delirium Stroke coordinator recommend CT scan of brain for possible underlying stroke given new onset A-fib. No neurological deficits at this time We will follow-up on CT scan findings   Atrial fibrillation with rapid ventricular response Cardiologist on board and case discussed Continue amiodarone Continue Eliquis Patient taken for DCCV on 10/08/2023 by cardiologist with successful conversion to normal sinus rhythm Echo showing EF 50 to 55%   Left ankle pain Imaging studies did not show any injuries/fractures.   Hyperlipidemia Continue with statin.   Hypothyroidism Continue with levothyroxine.   Essential hypertension Noted to be on amlodipine losartan and metoprolol prior to admission.  These have been resumed.  Monitor blood pressures closely.   Hypokalemia-improved Continue monitoring with repletion as needed    Thrombocytopenia Continue to monitor closely   DVT Prophylaxis: Definitive prophylaxis after surgery and per orthopedics recommendation Code Status: Full code Family Communication: Discussed with patient.  No family at bedside Disposition Plan: To be determined   Status is: Inpatient Remains inpatient appropriate because: Left hip fracture     Subjective:  Patient seen and examined at bedside this morning Denied any acute overnight events   Physical Exam: General appearance: Awake alert.  In no distress Resp: Clear to auscultation bilaterally.  Normal effort Cardio: S1-S2 is normal irregularly irregular mildly tachycardic  GI: Abdomen is soft.  Nontender nondistended.  Bowel sounds are present normal.  No masses organomegaly Extremities: No edema.  Left lower extremity is externally rotated Neurologic: Alert and oriented x3.  No focal neurological deficits.    Disposition: Home currently medically cleared for discharge to skilled nursing facility  Data Reviewed:   Vitals:   10/09/23 2000 10/10/23 0300 10/10/23 0537 10/10/23 0842  BP: (!) 122/53 (!) 102/52 (!) 113/55 (!) 113/45  Pulse: 66 (!) 54 (!) 58 60  Resp: 16 16 13 16   Temp: 98.8 F (37.1 C) 98.4 F (36.9 C)  98.5 F (36.9 C)  TempSrc: Oral Axillary    SpO2: 96% 97% 97% 94%  Weight:      Height:          Latest Ref Rng & Units 10/09/2023    2:10 AM 10/08/2023    6:13 AM 10/07/2023    3:45 AM  CBC  WBC 4.0 - 10.5 K/uL 7.9  8.6  8.2   Hemoglobin 12.0 - 15.0 g/dL 95.6  21.3  08.6   Hematocrit 36.0 - 46.0 % 30.4  34.2  37.4   Platelets 150 - 400 K/uL 100  105  97        Latest Ref Rng & Units 10/09/2023    2:10 AM 10/08/2023    6:13 AM 10/07/2023    3:45 AM  BMP  Glucose 70 - 99 mg/dL 95  387  90   BUN 8 - 23 mg/dL 17  21  18    Creatinine 0.44 - 1.00 mg/dL 5.64  3.32  9.51   Sodium 135 - 145 mmol/L 136  137  138   Potassium 3.5 - 5.1 mmol/L 4.0  3.5  3.9   Chloride 98 - 111 mmol/L 106  104  106   CO2 22  - 32 mmol/L 23  23  23    Calcium 8.9 - 10.3 mg/dL 8.9  8.7  9.0      Author: Loyce Dys, MD 10/10/2023 3:28 PM  For on call review [email or web address].

## 2023-10-11 DIAGNOSIS — S72002A Fracture of unspecified part of neck of left femur, initial encounter for closed fracture: Secondary | ICD-10-CM | POA: Diagnosis not present

## 2023-10-11 DIAGNOSIS — Z01811 Encounter for preprocedural respiratory examination: Secondary | ICD-10-CM | POA: Diagnosis not present

## 2023-10-11 LAB — PHOSPHORUS: Phosphorus: 3.8 mg/dL (ref 2.5–4.6)

## 2023-10-11 NOTE — Plan of Care (Signed)

## 2023-10-11 NOTE — Progress Notes (Signed)
Progress Note   Patient: [name] AOZ:[id number] DOB: 1941/09/01 DOA: 10/03/2023     8 DOS: the patient was seen and examined on 10/11/2023    Brief hospital course:  Brief History: 83 year old Caucasian female with past medical history of essential hypertension, hyperlipidemia, depression, anxiety, hypothyroidism and anemia presents after a mechanical fall while she was in [employer] parking lot.  She was however able to get into her car and was able to drive home but could not get out of the car at home.  EMS was called.  She was brought into the emergency department.  Found to have left hip fracture.  She was hospitalized for further management.   Consultants: Orthopedics   Procedures: None yet   Assessment/Plan:   Left hip fracture status post fracture repair Management per orthopedics.  Orthopedics on board and case discussed Continue as needed pain medication. Continue PT OT PT has recommended skilled nursing facility Case discussed with [facility] manager   Fluctuating mental status in the setting of new onset A-fib Concerns of acute illness related delirium Stroke coordinator recommend CT scan of brain for possible underlying stroke given new onset A-fib. No neurological deficits at this time We will follow-up on CT scan findings   Atrial fibrillation with rapid ventricular response Cardiologist on board and case discussed Continue amiodarone Continue Eliquis Patient taken for DCCV on 10/08/2023 by cardiologist with successful conversion to normal sinus rhythm Echo showing EF 50 to 55%   Left ankle pain Imaging studies did not show any injuries/fractures.   Hyperlipidemia Continue with statin.   Hypothyroidism Continue with levothyroxine.   Essential hypertension Noted to be on amlodipine losartan and metoprolol prior to admission.  These have been resumed.  Monitor blood pressures closely.   Hypokalemia-improved Continue monitoring with repletion as needed    Thrombocytopenia Continue to monitor closely   DVT Prophylaxis: Definitive prophylaxis after surgery and per orthopedics recommendation Code Status: Full code Family Communication: Discussed with patient.  No family at bedside Disposition Plan: To be determined   Status is: Inpatient Remains inpatient appropriate because: Left hip fracture     Subjective:  Patient seen and examined at bedside this morning Denies nausea vomiting abdominal pain or chest pain   Physical Exam: General appearance: Awake alert.  In no distress Resp: Clear to auscultation bilaterally.  Normal effort Cardio: S1-S2 is normal irregularly irregular mildly tachycardic  GI: Abdomen is soft.  Nontender nondistended.  Bowel sounds are present normal.  No masses organomegaly Extremities: No edema.  Left lower extremity is externally rotated Neurologic: Alert and oriented x3.  No focal neurological deficits.    Disposition: Home currently medically cleared for discharge to skilled nursing facility   Data Reviewed:      Latest Ref Rng & Units 10/09/2023    2:10 AM 10/08/2023    6:13 AM 10/07/2023    3:45 AM  BMP  Glucose 70 - 99 mg/dL 95  962  90   BUN 8 - 23 mg/dL 17  21  18    Creatinine 0.44 - 1.00 mg/dL 9.52  8.41  3.24   Sodium 135 - 145 mmol/L 136  137  138   Potassium 3.5 - 5.1 mmol/L 4.0  3.5  3.9   Chloride 98 - 111 mmol/L 106  104  106   CO2 22 - 32 mmol/L 23  23  23    Calcium 8.9 - 10.3 mg/dL 8.9  8.7  9.0      Vitals:  10/10/23 2010 10/11/23 0500 10/11/23 0827 10/11/23 1200  BP: 128/82  (!) 121/50 118/88  Pulse: 62  (!) 58 60  Resp:   17 14  Temp: 99.6 F (37.6 C)  98.8 F (37.1 C)   TempSrc: Oral     SpO2: 96%  96% 99%  Weight:  67.9 kg    Height:          Latest Ref Rng & Units 10/09/2023    2:10 AM 10/08/2023    6:13 AM 10/07/2023    3:45 AM  CBC  WBC 4.0 - 10.5 K/uL 7.9  8.6  8.2   Hemoglobin 12.0 - 15.0 g/dL 95.6  21.3  08.6   Hematocrit 36.0 - 46.0 % 30.4  34.2  37.4    Platelets 150 - 400 K/uL 100  105  97      Author: Loyce Dys, MD 10/11/2023 5:24 PM  For on call review [email or web address].

## 2023-10-11 NOTE — Progress Notes (Signed)
Physical Therapy Treatment Patient Details Name: [name] MRN: [id number] DOB: 1941/01/04 Today's Date: 10/11/2023   History of Present Illness Pt is an 83 y.o. female presenting to hospital 10/03/23 d/t concerns for L leg pain after fall.  Imaging showing acute nondisplaced intertrochanteric L femur fx.  Pt s/p 10/04/23 L intertrochanteric IM nail.  Pt went into afib with RVR in PACU after surgery.  PMH includes htn, HLD, depression, anxiety, anemia, sleep apnea, lower back surgery, B CTR, B knee sx, ORIF distal radial fx L 2020, spinal cord stimulator implant and removal, total knee revision.    PT Comments  Pt ready for session and motivated to get up.  She is able to get to EOB with mod a x 1.  Generally steady in sitting.  She stands to RW with mod a x 1 and is generally unsteady and limited by c/o dizziness.  Assisted to sitting and she is able to stand pivot transfer to recliner after seated rest (no AD used).  Remained up in chair with needs met.  Attempted orthostatic BP's but pt unable to stay standing safely with +1 assist.  BP's that I did get were stable in supine, sitting and sitting after standing.  No c/o dizziness in chair.  Discussed with RN.     If plan is discharge home, recommend the following: A lot of help with bathing/dressing/bathroom;Assistance with cooking/housework;Assist for transportation;Help with stairs or ramp for entrance;A lot of help with walking and/or transfers   Can travel by private vehicle        Equipment Recommendations       Recommendations for Other Services       Precautions / Restrictions Precautions Precautions: Fall Restrictions Weight Bearing Restrictions Per Provider Order: No LLE Weight Bearing Per Provider Order: Weight bearing as tolerated     Mobility  Bed Mobility Overal bed mobility: Needs Assistance Bed Mobility: Supine to Sit     Supine to sit: Mod assist, Used rails, HOB elevated       Patient Response:  Cooperative  Transfers Overall transfer level: Needs assistance Equipment used: Rolling walker (2 wheels) Transfers: Sit to/from Stand, Bed to chair/wheelchair/BSC Sit to Stand: Mod assist   Step pivot transfers: Mod assist       General transfer comment: generally unsteady in standing.  unable to remain up with +1 assist for orthostatic BP's    Ambulation/Gait         Gait velocity: decreased     General Gait Details: Unable to progress away from EOB due to dizziness and +1 assist   Stairs             Wheelchair Mobility     Tilt Bed Tilt Bed Patient Response: Cooperative  Modified Rankin (Stroke Patients Only)       Balance Overall balance assessment: Needs assistance Sitting-balance support: No upper extremity supported, Feet supported Sitting balance-Leahy Scale: Good     Standing balance support: Bilateral upper extremity supported, Reliant on assistive device for balance Standing balance-Leahy Scale: Poor Standing balance comment: increased difficulty today                            Cognition Arousal: Alert Behavior During Therapy: WFL for tasks assessed/performed, Impulsive Overall Cognitive Status: Impaired/Different from baseline  General Comments: very talkative and cues at times to stay on task        Exercises      General Comments        Pertinent Vitals/Pain Pain Assessment Pain Assessment: Faces Faces Pain Scale: Hurts a little bit Pain Location: L hip/thigh Pain Descriptors / Indicators: Aching, Sore, Tender Pain Intervention(s): Limited activity within patient's tolerance, Monitored during session, Repositioned    Home Living                          Prior Function            PT Goals (current goals can now be found in the care plan section) Progress towards PT goals: Progressing toward goals    Frequency    7X/week      PT Plan       Co-evaluation              AM-PAC PT "6 Clicks" Mobility   Outcome Measure  Help needed turning from your back to your side while in a flat bed without using bedrails?: A Little Help needed moving from lying on your back to sitting on the side of a flat bed without using bedrails?: A Lot Help needed moving to and from a bed to a chair (including a wheelchair)?: A Lot Help needed standing up from a chair using your arms (e.g., wheelchair or bedside chair)?: A Lot Help needed to walk in hospital room?: A Lot Help needed climbing 3-5 steps with a railing? : Total 6 Click Score: 12    End of Session Equipment Utilized During Treatment: Gait belt Activity Tolerance: Patient tolerated treatment well;Treatment limited secondary to medical complications (Comment) Patient left: in chair;with call bell/phone within reach;with chair alarm set Nurse Communication: Mobility status;Precautions;Weight bearing status PT Visit Diagnosis: Other abnormalities of gait and mobility (R26.89);Muscle weakness (generalized) (M62.81);History of falling (Z91.81);Pain Pain - Right/Left: Left Pain - part of body: Hip     Time: 6440-3474 PT Time Calculation (min) (ACUTE ONLY): 14 min  Charges:    $Therapeutic Activity: 8-22 mins PT General Charges $$ ACUTE PT VISIT: 1 Visit                   Danielle Dess, PTA 10/11/23, 12:56 PM

## 2023-10-12 DIAGNOSIS — S72002A Fracture of unspecified part of neck of left femur, initial encounter for closed fracture: Secondary | ICD-10-CM | POA: Diagnosis not present

## 2023-10-12 DIAGNOSIS — Z01811 Encounter for preprocedural respiratory examination: Secondary | ICD-10-CM | POA: Diagnosis not present

## 2023-10-12 LAB — CBC WITH DIFFERENTIAL/PLATELET
Abs Immature Granulocytes: 0.04 10*3/uL (ref 0.00–0.07)
Basophils Absolute: 0 10*3/uL (ref 0.0–0.1)
Basophils Relative: 1 %
Eosinophils Absolute: 0.2 10*3/uL (ref 0.0–0.5)
Eosinophils Relative: 2 %
HCT: 32.4 % — ABNORMAL LOW (ref 36.0–46.0)
Hemoglobin: 11 g/dL — ABNORMAL LOW (ref 12.0–15.0)
Immature Granulocytes: 1 %
Lymphocytes Relative: 22 %
Lymphs Abs: 1.9 10*3/uL (ref 0.7–4.0)
MCH: 34.2 pg — ABNORMAL HIGH (ref 26.0–34.0)
MCHC: 34 g/dL (ref 30.0–36.0)
MCV: 100.6 fL — ABNORMAL HIGH (ref 80.0–100.0)
Monocytes Absolute: 1.4 10*3/uL — ABNORMAL HIGH (ref 0.1–1.0)
Monocytes Relative: 17 %
Neutro Abs: 5.1 10*3/uL (ref 1.7–7.7)
Neutrophils Relative %: 57 %
Platelets: 172 10*3/uL (ref 150–400)
RBC: 3.22 MIL/uL — ABNORMAL LOW (ref 3.87–5.11)
RDW: 11.9 % (ref 11.5–15.5)
WBC: 8.6 10*3/uL (ref 4.0–10.5)
nRBC: 0 % (ref 0.0–0.2)

## 2023-10-12 LAB — BASIC METABOLIC PANEL
Anion gap: 8 (ref 5–15)
BUN: 18 mg/dL (ref 8–23)
CO2: 25 mmol/L (ref 22–32)
Calcium: 9.1 mg/dL (ref 8.9–10.3)
Chloride: 103 mmol/L (ref 98–111)
Creatinine, Ser: 0.61 mg/dL (ref 0.44–1.00)
GFR, Estimated: >60 mL/min (ref >60)
Glucose, Bld: 94 mg/dL (ref 70–99)
Potassium: 3.8 mmol/L (ref 3.5–5.1)
Sodium: 136 mmol/L (ref 135–145)

## 2023-10-12 LAB — PHOSPHORUS: Phosphorus: 3.5 mg/dL (ref 2.5–4.6)

## 2023-10-12 NOTE — Progress Notes (Signed)
Physical Therapy Treatment Patient Details Name: [name] MRN: [id number] DOB: 1941-07-07 Today's Date: 10/12/2023   History of Present Illness Pt is an 83 y.o. female presenting to hospital 10/03/23 d/t concerns for L leg pain after fall.  Imaging showing acute nondisplaced intertrochanteric L femur fx.  Pt s/p 10/04/23 L intertrochanteric IM nail.  Pt went into afib with RVR in PACU after surgery.  PMH includes htn, HLD, depression, anxiety, anemia, sleep apnea, lower back surgery, B CTR, B knee sx, ORIF distal radial fx L 2020, spinal cord stimulator implant and removal, total knee revision.    PT Comments  Pt received in bed requesting to use BSC. ModA with HOB raised and use of rail to transition to EOB. Good upright sitting EOB with minimal c/o L hip discomfort. Sit <> stand from bed and commode with ModA to raise to RW and steady progressing to MinA for short distance gait to bedside chair. Overall improved ability to wt shift onto L side, anticipate gait progression next visit. No dizziness throughout session VSS.    If plan is discharge home, recommend the following: A lot of help with bathing/dressing/bathroom;Assistance with cooking/housework;Assist for transportation;Help with stairs or ramp for entrance;A lot of help with walking and/or transfers   Can travel by private vehicle     No  Equipment Recommendations  Other (comment) (TBD at next level of care)    Recommendations for Other Services       Precautions / Restrictions Precautions Precautions: Fall Restrictions Weight Bearing Restrictions Per Provider Order: Yes LLE Weight Bearing Per Provider Order: Weight bearing as tolerated     Mobility  Bed Mobility Overal bed mobility: Needs Assistance Bed Mobility: Supine to Sit     Supine to sit: Mod assist, Used rails, HOB elevated Sit to supine: Mod assist   General bed mobility comments: VSS sitting EOB, pt on heart monitor in room    Transfers Overall  transfer level: Needs assistance Equipment used: Rolling walker (2 wheels) Transfers: Sit to/from Stand, Bed to chair/wheelchair/BSC Sit to Stand: Mod assist   Step pivot transfers: Mod assist       General transfer comment: ModA initially to gain balance upon standing, MinA to turn to bedside commode with RW    Ambulation/Gait Ambulation/Gait assistance: Min assist Gait Distance (Feet): 5 Feet Assistive device: Rolling walker (2 wheels) Gait Pattern/deviations: Step-to pattern, Antalgic Gait velocity: decreased     General Gait Details: No dizziness in standing, increased step length and wt shifting to L LE noted   Stairs             Wheelchair Mobility     Tilt Bed    Modified Rankin (Stroke Patients Only)       Balance Overall balance assessment: Needs assistance Sitting-balance support: No upper extremity supported, Feet supported Sitting balance-Leahy Scale: Good Sitting balance - Comments: steady reaching within BOS   Standing balance support: Bilateral upper extremity supported, Reliant on assistive device for balance Standing balance-Leahy Scale: Fair                              Cognition Arousal: Alert Behavior During Therapy: WFL for tasks assessed/performed, Impulsive Overall Cognitive Status: Impaired/Different from baseline Area of Impairment: Attention, Memory, Following commands, Safety/judgement, Awareness, Orientation                 Orientation Level: Disoriented to, Time Current Attention Level: Sustained Memory: Decreased short-term  memory, Decreased recall of precautions Following Commands: Follows one step commands with increased time Safety/Judgement: Decreased awareness of safety, Decreased awareness of deficits Awareness: Intellectual   General Comments: very talkative and cues at times to stay on task        Exercises Other Exercises Other Exercises: edu on transfers on/off BSC and proper use of RW     General Comments General comments (skin integrity, edema, etc.): L lateral hip dressing intact. Pt educated on role of PT and oriented to situation.      Pertinent Vitals/Pain Pain Assessment Pain Assessment: Faces Faces Pain Scale: Hurts little more Pain Location: L hip/thigh Pain Descriptors / Indicators: Aching, Sore, Tender Pain Intervention(s): Monitored during session    Home Living                          Prior Function            PT Goals (current goals can now be found in the care plan section) Acute Rehab PT Goals Patient Stated Goal: "Get stronger so I can go home with my husband who is a BKA." Progress towards PT goals: Progressing toward goals    Frequency    7X/week      PT Plan      Co-evaluation              AM-PAC PT "6 Clicks" Mobility   Outcome Measure  Help needed turning from your back to your side while in a flat bed without using bedrails?: A Little Help needed moving from lying on your back to sitting on the side of a flat bed without using bedrails?: A Lot Help needed moving to and from a bed to a chair (including a wheelchair)?: A Lot Help needed standing up from a chair using your arms (e.g., wheelchair or bedside chair)?: A Lot Help needed to walk in hospital room?: A Lot Help needed climbing 3-5 steps with a railing? : Total 6 Click Score: 12    End of Session Equipment Utilized During Treatment: Gait belt Activity Tolerance: Patient tolerated treatment well;Treatment limited secondary to medical complications (Comment) Patient left: in chair;with call bell/phone within reach;with chair alarm set;with family/visitor present Nurse Communication: Mobility status;Other (comment) (Large BM (documented)) PT Visit Diagnosis: Other abnormalities of gait and mobility (R26.89);Muscle weakness (generalized) (M62.81);History of falling (Z91.81);Pain Pain - Right/Left: Left Pain - part of body: Hip     Time: 1191-4782 PT  Time Calculation (min) (ACUTE ONLY): 32 min  Charges:    $Therapeutic Activity: 23-37 mins PT General Charges $$ ACUTE PT VISIT: 1 Visit                    Zadie Cleverly, PTA  Jannet Askew 10/12/2023, 2:13 PM

## 2023-10-12 NOTE — Plan of Care (Signed)

## 2023-10-12 NOTE — Progress Notes (Signed)
Progress Note   Patient: [name] ZDG:[id number] DOB: [birth year] DOA: 10/03/2023     9 DOS: the patient was seen and examined on 10/12/2023   Brief hospital course:  Brief History: 83 year old Caucasian female with past medical history of essential hypertension, hyperlipidemia, depression, anxiety, hypothyroidism and anemia presents after a mechanical fall while she was in [employer] parking lot.  She was however able to get into her car and was able to drive home but could not get out of the car at home.  EMS was called.  She was brought into the emergency department.  Found to have left hip fracture.  She was hospitalized for further management.   Consultants: Orthopedics   Procedures: None yet   Assessment/Plan:   Left hip fracture status post fracture repair Management per orthopedics.  Orthopedics on board and case discussed Continue as needed pain medication. Continue PT OT PT has recommended skilled nursing facility Case discussed with [facility] manager   Fluctuating mental status in the setting of new onset A-fib Concerns of acute illness related delirium Stroke coordinator recommend CT scan of brain for possible underlying stroke given new onset A-fib. No neurological deficits at this time We will follow-up on CT scan findings   Atrial fibrillation with rapid ventricular response Cardiologist on board and case discussed Continue amiodarone Continue Eliquis Patient taken for DCCV on 10/08/2023 by cardiologist with successful conversion to normal sinus rhythm Echo showing EF 50 to 55%   Left ankle pain Imaging studies did not show any injuries/fractures.   Hyperlipidemia Continue with statin.   Hypothyroidism Continue with levothyroxine.   Essential hypertension Noted to be on amlodipine losartan and metoprolol prior to admission.  These have been resumed.  Monitor blood pressures closely.   Hypokalemia-improved Continue monitoring with repletion as needed    Thrombocytopenia Continue to monitor closely   DVT Prophylaxis: Definitive prophylaxis after surgery and per orthopedics recommendation Code Status: Full code Family Communication: Discussed with patient.  No family at bedside Disposition Plan: To be determined   Status is: Inpatient Remains inpatient appropriate because: Left hip fracture     Subjective:  Patient seen and examined at bedside this morning Denies chest pain abdominal pain nausea vomiting CT have been discontinued   Physical Exam: General appearance: Awake alert.  In no distress Resp: Clear to auscultation bilaterally.  Normal effort Cardio: S1-S2 is normal irregularly irregular mildly tachycardic  GI: Abdomen is soft.  Nontender nondistended.  Bowel sounds are present normal.  No masses organomegaly Extremities: No edema.  Left lower extremity is externally rotated Neurologic: Alert and oriented x3.  No focal neurological deficits.    Disposition: Home currently medically cleared for discharge to skilled nursing facility   Data Reviewed:     Latest Ref Rng & Units 10/12/2023    5:20 AM 10/09/2023    2:10 AM 10/08/2023    6:13 AM  BMP  Glucose 70 - 99 mg/dL 94  95  951   BUN 8 - 23 mg/dL 18  17  21    Creatinine 0.44 - 1.00 mg/dL 8.84  1.66  0.63   Sodium 135 - 145 mmol/L 136  136  137   Potassium 3.5 - 5.1 mmol/L 3.8  4.0  3.5   Chloride 98 - 111 mmol/L 103  106  104   CO2 22 - 32 mmol/L 25  23  23    Calcium 8.9 - 10.3 mg/dL 9.1  8.9  8.7     Vitals:  10/12/23 0517 10/12/23 0757 10/12/23 1223 10/12/23 1636  BP:  (!) 118/58 (!) 109/55 (!) 120/54  Pulse:  (!) 57 61 60  Resp:  18 18 18   Temp:  98.2 F (36.8 C) 98.2 F (36.8 C) 98.3 F (36.8 C)  TempSrc:      SpO2:  99% 96% 98%  Weight: 63.5 kg     Height:          Latest Ref Rng & Units 10/12/2023    5:20 AM 10/09/2023    2:10 AM 10/08/2023    6:13 AM  CBC  WBC 4.0 - 10.5 K/uL 8.6  7.9  8.6   Hemoglobin 12.0 - 15.0 g/dL 11.9  14.7  82.9    Hematocrit 36.0 - 46.0 % 32.4  30.4  34.2   Platelets 150 - 400 K/uL 172  100  105      Author: Loyce Dys, MD 10/12/2023 5:48 PM  For on call review [email or web address].

## 2023-10-13 DIAGNOSIS — S72002A Fracture of unspecified part of neck of left femur, initial encounter for closed fracture: Secondary | ICD-10-CM | POA: Diagnosis not present

## 2023-10-13 DIAGNOSIS — Z01811 Encounter for preprocedural respiratory examination: Secondary | ICD-10-CM | POA: Diagnosis not present

## 2023-10-13 LAB — BASIC METABOLIC PANEL
Anion gap: 10 (ref 5–15)
BUN: 19 mg/dL (ref 8–23)
CO2: 21 mmol/L — ABNORMAL LOW (ref 22–32)
Calcium: 8.8 mg/dL — ABNORMAL LOW (ref 8.9–10.3)
Chloride: 103 mmol/L (ref 98–111)
Creatinine, Ser: 0.54 mg/dL (ref 0.44–1.00)
GFR, Estimated: >60 mL/min (ref >60)
Glucose, Bld: 86 mg/dL (ref 70–99)
Potassium: 4 mmol/L (ref 3.5–5.1)
Sodium: 134 mmol/L — ABNORMAL LOW (ref 135–145)

## 2023-10-13 LAB — CBC WITH DIFFERENTIAL/PLATELET
Abs Immature Granulocytes: 0.05 10*3/uL (ref 0.00–0.07)
Basophils Absolute: 0.1 10*3/uL (ref 0.0–0.1)
Basophils Relative: 1 %
Eosinophils Absolute: 0.1 10*3/uL (ref 0.0–0.5)
Eosinophils Relative: 1 %
HCT: 32.5 % — ABNORMAL LOW (ref 36.0–46.0)
Hemoglobin: 10.8 g/dL — ABNORMAL LOW (ref 12.0–15.0)
Immature Granulocytes: 1 %
Lymphocytes Relative: 20 %
Lymphs Abs: 1.7 10*3/uL (ref 0.7–4.0)
MCH: 33.6 pg (ref 26.0–34.0)
MCHC: 33.2 g/dL (ref 30.0–36.0)
MCV: 101.2 fL — ABNORMAL HIGH (ref 80.0–100.0)
Monocytes Absolute: 1.5 10*3/uL — ABNORMAL HIGH (ref 0.1–1.0)
Monocytes Relative: 18 %
Neutro Abs: 5 10*3/uL (ref 1.7–7.7)
Neutrophils Relative %: 59 %
Platelets: 138 10*3/uL — ABNORMAL LOW (ref 150–400)
RBC: 3.21 MIL/uL — ABNORMAL LOW (ref 3.87–5.11)
RDW: 11.8 % (ref 11.5–15.5)
WBC: 8.4 10*3/uL (ref 4.0–10.5)
nRBC: 0 % (ref 0.0–0.2)

## 2023-10-13 LAB — PHOSPHORUS: Phosphorus: 3.2 mg/dL (ref 2.5–4.6)

## 2023-10-13 NOTE — Progress Notes (Signed)
Nutrition Follow-up  DOCUMENTATION CODES:   Not applicable  INTERVENTION:   -Carb modified diet to help manage hyperglycemia  -Continue MVI with minerals daily -Continue Magic cup TID with meals, each supplement provides 290 kcal and 9 grams of protein  -Continue feeding assistance with meals -D/c Ensure Enlive  -Discussed with DM coordinator via secure chat   NUTRITION DIAGNOSIS:   Inadequate oral intake related to acute illness as evidenced by meal completion < 25%.  Ongoing  GOAL:   Patient will meet greater than or equal to 90% of their needs  Progressing   MONITOR:   PO intake, Supplement acceptance, Labs, Weight trends, I & O's, Skin  REASON FOR ASSESSMENT:   Consult Hip fracture protocol  ASSESSMENT:   83 y/o female with h/o HTN, depression, anxiety, HLD, RLS and parathyroidectomy who is admitted with hip fracture after fall now s/p ORIF 1/12 complicated by Afib and delerium.  1/12- s/p Reduction and internal fixation of intertrochanteric left hip fracture with [employer] TFN nail  1/16- s/p cardioversion  Reviewed I/O's: -160 ml x 24 hours and -164 ml since admission  UOP: 400 ml x 24 hours  Pt currently on a heart healthy diet. Noted meal completions 50-85%. She is refusing Ensure supplements.   Medications reviewed and include colace, melatonin, mirlaax, and senokot.   Wt has been stable over the past week.   Per TOC notes, awaiting SNF placement for discharge. Pt is medically stable.   Labs reviewed: Na: 134, CBGS: 237-243 (inpatient orders for glycemic control are none). Suspect hyperglycemia may be medications related- beta blockers and statin drugs can cause hyperglycemia in certain populations.   Diet Order:   Diet Order             Diet Heart Room service appropriate? Yes; Fluid consistency: Thin  Diet effective now                   EDUCATION NEEDS:   Education needs have been addressed  Skin:  Skin Assessment: Skin  Integrity Issues: Skin Integrity Issues:: Incisions Incisions: closed lt leg  Last BM:  10/13/23 (type 2)  Height:   Ht Readings from Last 1 Encounters:  10/08/23 5\' 1"  (1.549 m)    Weight:   Wt Readings from Last 1 Encounters:  10/13/23 64.3 kg    Ideal Body Weight:  47.7 kg  BMI:  Body mass index is 26.78 kg/m.  Estimated Nutritional Needs:   Kcal:  1650-1850  Protein:  85-100 grams  Fluid:  >1.6 L    Levada Schilling, RD, LDN, CDCES Registered Dietitian III Certified Diabetes Care and Education Specialist If unable to reach this RD, please use "RD Inpatient" group chat on secure chat between hours of 8am-4 pm daily

## 2023-10-13 NOTE — TOC Progression Note (Signed)
Transition of Care [facility]) - Progression Note    Patient Details  Name: [name] MRN: [id number] Date of Birth: 1941/07/30  [facility] CM/SW Contact  Margarito Liner, LCSW Phone Number: 10/13/2023, 3:39 PM  Clinical Narrative:  No family at bedside. CSW called husband to notify him that [facility] is the only bed offer. He wants to extend the search to other counties to see if anyone else can offer.   Expected Discharge Plan: Skilled Nursing Facility Barriers to Discharge: Continued Medical Work up  Expected Discharge Plan and Services     Post Acute Care Choice: Skilled Nursing Facility Living arrangements for the past 2 months: Single Family Home                                       Social Determinants of Health (SDOH) Interventions SDOH Screenings   Food Insecurity: No Food Insecurity (10/04/2023)  Housing: Low Risk  (10/04/2023)  Transportation Needs: No Transportation Needs (10/04/2023)  Utilities: Not At Risk (10/04/2023)  Alcohol Screen: Low Risk  (12/26/2022)  Depression (PHQ2-9): Medium Risk (09/30/2023)  Financial Resource Strain: Low Risk  (05/16/2023)   Received from [facility]  Physical Activity: Inactive (02/03/2023)  Social Connections: Moderately Integrated (10/04/2023)  Stress: No Stress Concern Present (08/18/2023)  Tobacco Use: High Risk (10/08/2023)  Health Literacy: Adequate Health Literacy (05/26/2023)    Readmission Risk Interventions     No data to display

## 2023-10-13 NOTE — Progress Notes (Signed)
Progress Note   Patient: [name] UEA:[id number] DOB: [birth year] DOA: 10/03/2023     10 DOS: the patient was seen and examined on 10/13/2023    Brief hospital course:  Brief History: 83 year old Caucasian female with past medical history of essential hypertension, hyperlipidemia, depression, anxiety, hypothyroidism and anemia presents after a mechanical fall while she was in [employer] parking lot.  She was however able to get into her car and was able to drive home but could not get out of the car at home.  EMS was called.  She was brought into the emergency department.  Found to have left hip fracture.  She was hospitalized for further management.   Consultants: Orthopedics   Procedures: None yet   Assessment/Plan:   Left hip fracture status post fracture repair Management per orthopedics.  Orthopedics on board and case discussed Continue as needed pain medication. Continue PT OT PT has recommended skilled nursing facility [facility] working on placement   Fluctuating mental status in the setting of new onset A-fib Concerns of acute illness related delirium Stroke coordinator recommend CT scan of brain for possible underlying stroke given new onset A-fib. No neurological deficits at this time We will follow-up on CT scan findings   Atrial fibrillation with rapid ventricular response Cardiologist on board and case discussed Continue amiodarone Continue Eliquis Patient taken for DCCV on 10/08/2023 by cardiologist with successful conversion to normal sinus rhythm Echo showing EF 50 to 55%   Left ankle pain Imaging studies did not show any injuries/fractures.   Hyperlipidemia Continue with statin.   Hypothyroidism Continue with levothyroxine.   Essential hypertension Noted to be on amlodipine losartan and metoprolol prior to admission.  These have been resumed.  Monitor blood pressures closely.   Hypokalemia-improved Continue monitoring with repletion as needed    Thrombocytopenia Continue to monitor closely   DVT Prophylaxis: Definitive prophylaxis after surgery and per orthopedics recommendation Code Status: Full code Family Communication: Discussed with patient.  No family at bedside Disposition Plan: To be determined   Status is: Inpatient Remains inpatient appropriate because: Left hip fracture     Subjective:  Patient seen and examined at bedside this morning Denies nausea vomiting abdominal pain or chest pain   Physical Exam: General appearance: Awake alert.  In no distress Resp: Clear to auscultation bilaterally.  Normal effort Cardio: S1-S2 is normal irregularly irregular mildly tachycardic  GI: Abdomen is soft.  Nontender nondistended.  Bowel sounds are present normal.  No masses organomegaly Extremities: No edema.  Left lower extremity is externally rotated Neurologic: Alert and oriented x3.  No focal neurological deficits.    Disposition: Home currently medically cleared for discharge to skilled nursing facility   Data Reviewed:    Latest Ref Rng & Units 10/13/2023    4:41 AM 10/12/2023    5:20 AM 10/09/2023    2:10 AM  BMP  Glucose 70 - 99 mg/dL 86  94  95   BUN 8 - 23 mg/dL 19  18  17    Creatinine 0.44 - 1.00 mg/dL 4.78  2.95  6.21   Sodium 135 - 145 mmol/L 134  136  136   Potassium 3.5 - 5.1 mmol/L 4.0  3.8  4.0   Chloride 98 - 111 mmol/L 103  103  106   CO2 22 - 32 mmol/L 21  25  23    Calcium 8.9 - 10.3 mg/dL 8.8  9.1  8.9     Vitals:   10/13/23 0504 10/13/23 0815  10/13/23 1156 10/13/23 1610  BP:  123/61 123/60 (!) 120/55  Pulse:  (!) 58 (!) 53 (!) 57  Resp:  18 18 18   Temp:  98.2 F (36.8 C) 98.2 F (36.8 C) 98.2 F (36.8 C)  TempSrc:      SpO2:  98% 96% 98%  Weight: 64.3 kg     Height:          Latest Ref Rng & Units 10/13/2023    4:41 AM 10/12/2023    5:20 AM 10/09/2023    2:10 AM  CBC  WBC 4.0 - 10.5 K/uL 8.4  8.6  7.9   Hemoglobin 12.0 - 15.0 g/dL 75.6  43.3  29.5   Hematocrit 36.0 - 46.0 % 32.5   32.4  30.4   Platelets 150 - 400 K/uL 138  172  100      Author: Loyce Dys, MD 10/13/2023 6:40 PM  For on call review [email or web address].

## 2023-10-13 NOTE — Progress Notes (Signed)
Occupational Therapy Treatment Patient Details Name: [name] MRN: [id number] DOB: [birth year] Today's Date: 10/13/2023   History of present illness Pt is an 83 y.o. female presenting to hospital 10/03/23 d/t concerns for L leg pain after fall.  Imaging showing acute nondisplaced intertrochanteric L femur fx.  Pt s/p 10/04/23 L intertrochanteric IM nail.  Pt went into afib with RVR in PACU after surgery.  PMH includes htn, HLD, depression, anxiety, anemia, sleep apnea, lower back surgery, B CTR, B knee sx, ORIF distal radial fx L 2020, spinal cord stimulator implant and removal, total knee revision.   OT comments  Ms. Hayner was seen for OT/PT co-treatment on this date. Upon arrival to room pt supine in bed, endorsing pain in her LLE with no recollection as to why her leg was hurting. RN updated and in to give IV pain medicine during session. Pt ultimately agreeable to therapy session with gentle encouragement and education on importance of regular functional activity to maximize safety and recovery. OT facilitated ADL management with education and assist as described below. See ADL section for additional details regarding occupational performance. Pt continues to be functionally limited by LLE pain, decreased cognition, and decreased safety awareness. She continues to benefit from skilled OT services to maximize return to PLOF and minimize risk of future falls, injury, caregiver burden, and readmission. Will continue to follow POC as written. Discharge recommendation remains appropriate.        If plan is discharge home, recommend the following:  A lot of help with bathing/dressing/bathroom;A lot of help with walking and/or transfers;Supervision due to cognitive status   Equipment Recommendations  Other (comment) (defer to next venue of care)    Recommendations for Other Services      Precautions / Restrictions Precautions Precautions: Fall Restrictions Weight Bearing Restrictions Per  Provider Order: Yes LLE Weight Bearing Per Provider Order: Weight bearing as tolerated       Mobility Bed Mobility Overal bed mobility: Needs Assistance Bed Mobility: Supine to Sit     Supine to sit: Mod assist, HOB elevated, +2 for safety/equipment          Transfers Overall transfer level: Needs assistance Equipment used: Rolling walker (2 wheels) Transfers: Sit to/from Stand, Bed to chair/wheelchair/BSC Sit to Stand: Mod assist     Step pivot transfers: Contact guard assist     General transfer comment: Consistent cueing and assist to advance RW required for all mobility tasks during session.     Balance Overall balance assessment: Needs assistance Sitting-balance support: No upper extremity supported, Feet supported Sitting balance-Leahy Scale: Good Sitting balance - Comments: steady reaching within BOS   Standing balance support: Bilateral upper extremity supported, Reliant on assistive device for balance Standing balance-Leahy Scale: Fair Standing balance comment: increased difficulty today                           ADL either performed or assessed with clinical judgement   ADL Overall ADL's : Needs assistance/impaired                     Lower Body Dressing: Moderate assistance;+2 for safety/equipment Lower Body Dressing Details (indicate cue type and reason): MOD A to pull mesh underwear up over hips after toileting. Toilet Transfer: Moderate assistance;BSC/3in1;+2 for safety/equipment Toilet Transfer Details (indicate cue type and reason): MOD A for STS Toileting- Clothing Manipulation and Hygiene: Maximal assistance;Sit to/from stand;+2 for safety/equipment Toileting - Clothing Manipulation Details (  indicate cue type and reason): MAX A for peri-care. Pt very limited by cognition this date, has difficulty sequencing hygiene while seated on BSC.     Functional mobility during ADLs: +2 for safety/equipment;Moderate assistance;Contact  guard assist General ADL Comments: MOD A for STS with RW, able to progress to CGA with consistent verbal/tactile cueing for sequencing of mobility tasks this date. Easily distracted and requires constant re-direction to task/topic at hand. does not recall surgery and confused as to why her leg hurts.    Extremity/Trunk Assessment              Vision Patient Visual Report: No change from baseline     Perception     Praxis      Cognition Arousal: Alert Behavior During Therapy: WFL for tasks assessed/performed Overall Cognitive Status: Impaired/Different from baseline Area of Impairment: Attention, Memory, Following commands, Safety/judgement, Awareness, Orientation                 Orientation Level: Disoriented to, Time, Place, Situation Current Attention Level: Focused Memory: Decreased short-term memory, Decreased recall of precautions Following Commands: Follows one step commands with increased time Safety/Judgement: Decreased awareness of safety, Decreased awareness of deficits Awareness: Intellectual            Exercises Other Exercises Other Exercises: Pt educated on importance of regular functonal activity during hospital stay, safe transfer technique, and falls prevention strategies.    Shoulder Instructions       General Comments      Pertinent Vitals/ Pain       Pain Assessment Pain Assessment: 0-10 Pain Score: 7  Pain Location: L hip/thigh Pain Descriptors / Indicators: Aching, Sore, Tender, Grimacing, Guarding Pain Intervention(s): Limited activity within patient's tolerance, Monitored during session, Patient requesting pain meds-RN notified, RN gave pain meds during session, Repositioned  Home Living                                          Prior Functioning/Environment              Frequency  Min 1X/week        Progress Toward Goals  OT Goals(current goals can now be found in the care plan section)  Progress  towards OT goals: Progressing toward goals  Acute Rehab OT Goals Patient Stated Goal: to feel better OT Goal Formulation: With patient Time For Goal Achievement: 10/20/23 Potential to Achieve Goals: Good  Plan      Co-evaluation    PT/OT/SLP Co-Evaluation/Treatment: Yes Reason for Co-Treatment: Necessary to address cognition/behavior during functional activity;For patient/therapist safety;To address functional/ADL transfers PT goals addressed during session: Mobility/safety with mobility;Balance;Proper use of DME OT goals addressed during session: ADL's and self-care;Proper use of Adaptive equipment and DME      AM-PAC OT "6 Clicks" Daily Activity     Outcome Measure   Help from another person eating meals?: A Little Help from another person taking care of personal grooming?: A Little Help from another person toileting, which includes using toliet, bedpan, or urinal?: A Lot Help from another person bathing (including washing, rinsing, drying)?: A Lot Help from another person to put on and taking off regular upper body clothing?: A Lot Help from another person to put on and taking off regular lower body clothing?: A Lot 6 Click Score: 14    End of Session Equipment Utilized During Treatment: Rolling walker (2  wheels)  OT Visit Diagnosis: Other abnormalities of gait and mobility (R26.89);Muscle weakness (generalized) (M62.81);Unsteadiness on feet (R26.81)   Activity Tolerance Patient tolerated treatment well   Patient Left in bed;with call bell/phone within reach;with bed alarm set   Nurse Communication Mobility status        Time: 1109-1140 OT Time Calculation (min): 31 min  Charges: OT General Charges $OT Visit: 1 Visit OT Treatments $Self Care/Home Management : 8-22 mins  Rockney Ghee, M.S., OTR/L 10/13/23, 12:39 PM

## 2023-10-13 NOTE — Progress Notes (Signed)
Physical Therapy Treatment Patient Details Name: [name] MRN: [id number] DOB: [birth year] Today's Date: 10/13/2023   History of Present Illness Pt is an 83 y.o. female presenting to hospital 10/03/23 d/t concerns for L leg pain after fall.  Imaging showing acute nondisplaced intertrochanteric L femur fx.  Pt s/p 10/04/23 L intertrochanteric IM nail.  Pt went into afib with RVR in PACU after surgery.  PMH includes htn, HLD, depression, anxiety, anemia, sleep apnea, lower back surgery, B CTR, B knee sx, ORIF distal radial fx L 2020, spinal cord stimulator implant and removal, total knee revision.    PT Comments  Good functional progression today during co-tx with OT for pt and therapist safety with increased skilled hands on. Pt continues to require ModA for sit<>stand transfers. Good tolerance for gait training in room ~15' with RW and CG/MinA for proper sequence, management of RW, and cues to stay on task. Will continue to progress acutely until pt transitions to STR prior to d/c'ing home with husband.    If plan is discharge home, recommend the following: A lot of help with bathing/dressing/bathroom;Assistance with cooking/housework;Assist for transportation;Help with stairs or ramp for entrance;A lot of help with walking and/or transfers   Can travel by private vehicle     No  Equipment Recommendations  Other (comment)    Recommendations for Other Services       Precautions / Restrictions Precautions Precautions: Fall Restrictions Weight Bearing Restrictions Per Provider Order: Yes LLE Weight Bearing Per Provider Order: Weight bearing as tolerated     Mobility  Bed Mobility Overal bed mobility: Needs Assistance Bed Mobility: Supine to Sit     Supine to sit: Mod assist, HOB elevated, +2 for safety/equipment     General bed mobility comments: Slight dizziness EOB most likely due to Dilaudid    Transfers Overall transfer level: Needs assistance Equipment used: Rolling  walker (2 wheels) Transfers: Sit to/from Stand, Bed to chair/wheelchair/BSC Sit to Stand: Mod assist   Step pivot transfers: Contact guard assist       General transfer comment: Consistent cueing and assist to advance RW required for all mobility tasks during session.    Ambulation/Gait Ambulation/Gait assistance: Min assist Gait Distance (Feet): 15 Feet Assistive device: Rolling walker (2 wheels) Gait Pattern/deviations: Step-to pattern, Antalgic Gait velocity: decreased     General Gait Details: Repeated vc's to keep pt on task and complete proper sequencing. Pt very easily distracted   Stairs             Wheelchair Mobility     Tilt Bed    Modified Rankin (Stroke Patients Only)       Balance Overall balance assessment: Needs assistance Sitting-balance support: No upper extremity supported, Feet supported Sitting balance-Leahy Scale: Good Sitting balance - Comments: steady reaching within BOS   Standing balance support: Bilateral upper extremity supported, Reliant on assistive device for balance, During functional activity Standing balance-Leahy Scale: Fair                              Cognition Arousal: Alert Behavior During Therapy: WFL for tasks assessed/performed Overall Cognitive Status: Impaired/Different from baseline Area of Impairment: Attention, Memory, Following commands, Safety/judgement, Awareness, Orientation                 Orientation Level: Disoriented to, Time, Place, Situation Current Attention Level: Focused Memory: Decreased short-term memory, Decreased recall of precautions Following Commands: Follows one step commands with  increased time Safety/Judgement: Decreased awareness of safety, Decreased awareness of deficits Awareness: Intellectual   General Comments: 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      Pertinent Vitals/Pain Pain Assessment Pain Assessment: Faces Faces Pain Scale: Hurts little more Pain Location: L hip/thigh Pain Descriptors / Indicators: Aching, Sore, Tender, Grimacing, Guarding Pain Intervention(s): Monitored during session, Premedicated before session    Home Living                          Prior Function            PT Goals (current goals can now be found in the care plan section) Acute Rehab PT Goals Patient Stated Goal: "Get stronger so I can go home with my husband who is a BKA." Progress towards PT goals: Progressing toward goals    Frequency    7X/week      PT Plan      Co-evaluation PT/OT/SLP Co-Evaluation/Treatment: Yes Reason for Co-Treatment: Necessary to address cognition/behavior during functional activity;For patient/therapist safety;To address functional/ADL transfers PT goals addressed during session: Mobility/safety with mobility;Balance;Proper use of DME OT goals addressed during session: ADL's and self-care;Proper use of Adaptive equipment and DME      AM-PAC PT "6 Clicks" Mobility   Outcome Measure  Help needed turning from your back to your side while in a flat bed without using bedrails?: A Little Help needed moving from lying on your back to sitting on the side of a flat bed without using bedrails?: A Lot Help needed moving to and from a bed to a chair (including a wheelchair)?: A Lot Help needed standing up from a chair using your arms (e.g., wheelchair or bedside chair)?: A Lot Help needed to walk in hospital room?: A Lot Help needed climbing 3-5 steps with a railing? : Total 6 Click Score: 12    End of Session Equipment Utilized During Treatment: Gait belt Activity Tolerance: Patient tolerated treatment well Patient left: in chair;with call bell/phone within reach;with chair alarm set;with family/visitor present Nurse Communication:  Mobility status;Other (comment) PT Visit Diagnosis: Other abnormalities of gait and mobility (R26.89);Muscle weakness (generalized) (M62.81);History of falling (Z91.81);Pain Pain - Right/Left: Left Pain - part of body: Hip     Time: 1109-1140 PT Time Calculation (min) (ACUTE ONLY): 31 min  Charges:    $Therapeutic Activity: 8-22 mins PT General Charges $$ ACUTE PT VISIT: 1 Visit                    Zadie Cleverly, PTA  Jannet Askew 10/13/2023, 3:48 PM

## 2023-10-14 DIAGNOSIS — S7292XD Unspecified fracture of left femur, subsequent encounter for closed fracture with routine healing: Secondary | ICD-10-CM | POA: Diagnosis not present

## 2023-10-14 DIAGNOSIS — R2689 Other abnormalities of gait and mobility: Secondary | ICD-10-CM | POA: Diagnosis not present

## 2023-10-14 DIAGNOSIS — Z741 Need for assistance with personal care: Secondary | ICD-10-CM | POA: Diagnosis not present

## 2023-10-14 DIAGNOSIS — Z9181 History of falling: Secondary | ICD-10-CM | POA: Diagnosis not present

## 2023-10-14 DIAGNOSIS — M6281 Muscle weakness (generalized): Secondary | ICD-10-CM | POA: Diagnosis not present

## 2023-10-14 DIAGNOSIS — S72145A Nondisplaced intertrochanteric fracture of left femur, initial encounter for closed fracture: Secondary | ICD-10-CM | POA: Diagnosis not present

## 2023-10-14 DIAGNOSIS — I4891 Unspecified atrial fibrillation: Secondary | ICD-10-CM | POA: Diagnosis not present

## 2023-10-14 DIAGNOSIS — F32A Depression, unspecified: Secondary | ICD-10-CM | POA: Diagnosis not present

## 2023-10-14 LAB — CBC WITH DIFFERENTIAL/PLATELET
Abs Immature Granulocytes: 0.04 10*3/uL (ref 0.00–0.07)
Basophils Absolute: 0.1 10*3/uL (ref 0.0–0.1)
Basophils Relative: 1 %
Eosinophils Absolute: 0.1 10*3/uL (ref 0.0–0.5)
Eosinophils Relative: 2 %
HCT: 33.8 % — ABNORMAL LOW (ref 36.0–46.0)
Hemoglobin: 11.4 g/dL — ABNORMAL LOW (ref 12.0–15.0)
Immature Granulocytes: 1 %
Lymphocytes Relative: 23 %
Lymphs Abs: 1.8 10*3/uL (ref 0.7–4.0)
MCH: 33.8 pg (ref 26.0–34.0)
MCHC: 33.7 g/dL (ref 30.0–36.0)
MCV: 100.3 fL — ABNORMAL HIGH (ref 80.0–100.0)
Monocytes Absolute: 1.3 10*3/uL — ABNORMAL HIGH (ref 0.1–1.0)
Monocytes Relative: 17 %
Neutro Abs: 4.4 10*3/uL (ref 1.7–7.7)
Neutrophils Relative %: 56 %
Platelets: 230 10*3/uL (ref 150–400)
RBC: 3.37 MIL/uL — ABNORMAL LOW (ref 3.87–5.11)
RDW: 11.9 % (ref 11.5–15.5)
WBC: 7.7 10*3/uL (ref 4.0–10.5)
nRBC: 0 % (ref 0.0–0.2)

## 2023-10-14 LAB — BASIC METABOLIC PANEL
Anion gap: 8 (ref 5–15)
BUN: 20 mg/dL (ref 8–23)
CO2: 26 mmol/L (ref 22–32)
Calcium: 9.4 mg/dL (ref 8.9–10.3)
Chloride: 103 mmol/L (ref 98–111)
Creatinine, Ser: 0.53 mg/dL (ref 0.44–1.00)
GFR, Estimated: >60 mL/min (ref >60)
Glucose, Bld: 98 mg/dL (ref 70–99)
Potassium: 4.1 mmol/L (ref 3.5–5.1)
Sodium: 137 mmol/L (ref 135–145)

## 2023-10-14 LAB — PHOSPHORUS: Phosphorus: 3.9 mg/dL (ref 2.5–4.6)

## 2023-10-14 NOTE — TOC Progression Note (Addendum)
Transition of Care [facility]) - Progression Note    Patient Details  Name: [name] MRN: [id number] Date of Birth: October 24, 1940  [facility] CM/SW Contact  Margarito Liner, LCSW Phone Number: 10/14/2023, 11:55 AM  Clinical Narrative:  Patient has her husband have accepted the bed offer from [facility]. CSW left message for admissions coordinator to notify and confirm they would have a bed once auth obtained.   12:05 pm: CSW left voicemail for [facility] [employer]. Will start auth when they call back.  1:02 pm: Received call back from Tammy with [facility]. Started auth.  Expected Discharge Plan: Skilled Nursing Facility Barriers to Discharge: Continued Medical Work up  Expected Discharge Plan and Services     Post Acute Care Choice: Skilled Nursing Facility Living arrangements for the past 2 months: Single Family Home                                       Social Determinants of Health (SDOH) Interventions SDOH Screenings   Food Insecurity: No Food Insecurity (10/04/2023)  Housing: Low Risk  (10/04/2023)  Transportation Needs: No Transportation Needs (10/04/2023)  Utilities: Not At Risk (10/04/2023)  Alcohol Screen: Low Risk  (12/26/2022)  Depression (PHQ2-9): Medium Risk (09/30/2023)  Financial Resource Strain: Low Risk  (05/16/2023)   Received from [facility]  Physical Activity: Inactive (02/03/2023)  Social Connections: Moderately Integrated (10/04/2023)  Stress: No Stress Concern Present (08/18/2023)  Tobacco Use: High Risk (10/08/2023)  Health Literacy: Adequate Health Literacy (05/26/2023)    Readmission Risk Interventions     No data to display

## 2023-10-14 NOTE — Plan of Care (Signed)
  Problem: Education: Goal: Knowledge of General Education information will improve Description Including pain rating scale, medication(s)/side effects and non-pharmacologic comfort measures Outcome: Progressing   

## 2023-10-14 NOTE — Progress Notes (Signed)
PROGRESS NOTE    [name]  GEX:[id number] DOB: 1940/11/06 DOA: 10/03/2023 PCP: [name], NP   Assessment & Plan:   Principal Problem:   Closed left femoral fracture (HCC) Active Problems:   Hypertension   Anxiety   Psychophysiological insomnia   Acquired hypothyroidism   Hyperlipidemia   RLS (restless legs syndrome)   Overweight with body mass index (BMI) of 26 to 26.9 in adult   Thrombocytopenia (HCC)   Left ankle pain   Atrial fibrillation with rapid ventricular response (HCC)   Closed fracture of left hip (HCC)   SOB (shortness of breath)  Assessment and Plan: Left hip fracture: s/p reduction & internal fixation of intertrochanteric left hip fracture. PT/OT recs SNF. Pt is agreeable to SNF. CM is working on SNF placement   Likely delirium: mental status much improved. CT head shows no acute intracranial abnormalities   A. fib: w/ RVR. New onset. Continue on amio, metoprolol, eliquis. S/p cardioversion on 10/08/23  Cardio recs apprec    Left ankle pain: imaging neg for fracture. Norco prn    HLD: continue on statin    Hypothyroidism: continue on levothyroxine    HTN: continue on metoprolol   Hypokalemia: WNL today  Thrombocytopenia: WNL today      DVT prophylaxis: eliquis Code Status: full  Family Communication: discussed pt's care w/ pt's husband at bedside and answered his questions  Disposition Plan: d/c to SNF  Level of care: Progressive  Status is: Inpatient Remains inpatient appropriate because: needs SNF placement     Consultants:  Ortho surg  Procedures:   Antimicrobials:   Subjective: Pt c/o malaise   Objective: Vitals:   10/13/23 2325 10/14/23 0334 10/14/23 0501 10/14/23 0821  BP: (!) 127/55 118/66  (!) 120/56  Pulse: (!) 55 (!) 56  (!) 58  Resp: 18 18  16   Temp: 98.4 F (36.9 C) 98.9 F (37.2 C)  98.9 F (37.2 C)  TempSrc:    Oral  SpO2: 95% 94%  98%  Weight:   63 kg   Height:        Intake/Output Summary  (Last 24 hours) at 10/14/2023 1129 Last data filed at 10/14/2023 1002 Gross per 24 hour  Intake 240 ml  Output 1350 ml  Net -1110 ml   Filed Weights   10/12/23 0517 10/13/23 0504 10/14/23 0501  Weight: 63.5 kg 64.3 kg 63 kg    Examination:  General exam: Appears calm and comfortable  Respiratory system: Clear to auscultation. Respiratory effort normal. Cardiovascular system: S1 & S2+. No, rubs, gallops or clicks. Gastrointestinal system: Abdomen is nondistended, soft and nontender.  Normal bowel sounds heard. Central nervous system: Alert and oriented. Moves all extremities  Psychiatry: Judgement and insight appears improved. Mood & affect appropriate.     Data Reviewed: I have personally reviewed following labs and imaging studies  CBC: Recent Labs  Lab 10/08/23 0613 10/09/23 0210 10/12/23 0520 10/13/23 0441 10/14/23 0438  WBC 8.6 7.9 8.6 8.4 7.7  NEUTROABS  --   --  5.1 5.0 4.4  HGB 11.8* 10.5* 11.0* 10.8* 11.4*  HCT 34.2* 30.4* 32.4* 32.5* 33.8*  MCV 98.0 97.7 100.6* 101.2* 100.3*  PLT 105* 100* 172 138* 230   Basic Metabolic Panel: Recent Labs  Lab 10/08/23 0613 10/09/23 0210 10/10/23 0433 10/11/23 0428 10/12/23 0520 10/13/23 0441 10/14/23 0438  NA 137 136  --   --  136 134* 137  K 3.5 4.0  --   --  3.8  4.0 4.1  CL 104 106  --   --  103 103 103  CO2 23 23  --   --  25 21* 26  GLUCOSE 105* 95  --   --  94 86 98  BUN 21 17  --   --  18 19 20   CREATININE 0.62 0.68  --   --  0.61 0.54 0.53  CALCIUM 8.7* 8.9  --   --  9.1 8.8* 9.4  MG 2.0  --   --   --   --   --   --   PHOS 3.7 4.1 3.5 3.8 3.5 3.2 3.9   GFR: Estimated Creatinine Clearance: 46.9 mL/min (by C-G formula based on SCr of 0.53 mg/dL). Liver Function Tests: No results for input(s): "AST", "ALT", "ALKPHOS", "BILITOT", "PROT", "ALBUMIN" in the last 168 hours. No results for input(s): "LIPASE", "AMYLASE" in the last 168 hours. No results for input(s): "AMMONIA" in the last 168 hours. Coagulation  Profile: No results for input(s): "INR", "PROTIME" in the last 168 hours. Cardiac Enzymes: No results for input(s): "CKTOTAL", "CKMB", "CKMBINDEX", "TROPONINI" in the last 168 hours. BNP (last 3 results) No results for input(s): "PROBNP" in the last 8760 hours. HbA1C: No results for input(s): "HGBA1C" in the last 72 hours. CBG: Recent Labs  Lab 10/08/23 2019 10/08/23 2153  GLUCAP 243* 237*   Lipid Profile: No results for input(s): "CHOL", "HDL", "LDLCALC", "TRIG", "CHOLHDL", "LDLDIRECT" in the last 72 hours. Thyroid Function Tests: No results for input(s): "TSH", "T4TOTAL", "FREET4", "T3FREE", "THYROIDAB" in the last 72 hours. Anemia Panel: No results for input(s): "VITAMINB12", "FOLATE", "FERRITIN", "TIBC", "IRON", "RETICCTPCT" in the last 72 hours. Sepsis Labs: No results for input(s): "PROCALCITON", "LATICACIDVEN" in the last 168 hours.  No results found for this or any previous visit (from the past 240 hours).       Radiology Studies: No results found.      Scheduled Meds:  amiodarone  200 mg Oral BID   Followed by   Melene Muller ON 10/16/2023] amiodarone  200 mg Oral Daily   apixaban  5 mg Oral BID   atorvastatin  40 mg Oral QHS   citalopram  20 mg Oral Daily   docusate sodium  100 mg Oral BID   ezetimibe  10 mg Oral Daily   levothyroxine  150 mcg Oral Once per day on Monday Tuesday Wednesday Thursday Friday Saturday   melatonin  10 mg Oral QHS   metoprolol succinate  25 mg Oral Daily   multivitamin with minerals  1 tablet Oral Daily   polyethylene glycol  17 g Oral Daily   senna-docusate  2 tablet Oral QHS   Continuous Infusions:   LOS: 11 days       Charise Killian, MD [facility] Pager [phone]  If 7PM-7AM, please contact night-coverage [email or web address] 10/14/2023, 11:29 AM

## 2023-10-14 NOTE — Plan of Care (Signed)

## 2023-10-14 NOTE — Progress Notes (Signed)
Physical Therapy Treatment Patient Details Name: ORVA WEHMEYER MRN: [id number] DOB: 1940-12-17 Today's Date: 10/14/2023   History of Present Illness Pt is an 83 y.o. female presenting to hospital 10/03/23 d/t concerns for L leg pain after fall.  Imaging showing acute nondisplaced intertrochanteric L femur fx.  Pt s/p 10/04/23 L intertrochanteric IM nail.  Pt went into afib with RVR in PACU after surgery.  PMH includes htn, HLD, depression, anxiety, anemia, sleep apnea, lower back surgery, B CTR, B knee sx, ORIF distal radial fx L 2020, spinal cord stimulator implant and removal, total knee revision.    PT Comments  Pt continues to progress functionally and cognitively. Improved ability to complete transfers with Min/ModA. Gait distance with RW increased to 65ft with CGA, 3 point gait, and repeated cues for proper sequence and to stay on task. Pt pre-medicated prior to session with ~4/10 L hip pain with wt bearing. No LOB, slight dizziness due to pain meds. Currently awaiting transition to STR prior to returning home with husband.    If plan is discharge home, recommend the following: Assistance with cooking/housework;Assist for transportation;Help with stairs or ramp for entrance;A little help with walking and/or transfers;A little help with bathing/dressing/bathroom;Supervision due to cognitive status   Can travel by private vehicle     No  Equipment Recommendations  Other (comment) (TBD at next level of care)    Recommendations for Other Services       Precautions / Restrictions Precautions Precautions: Fall Restrictions Weight Bearing Restrictions Per Provider Order: No LLE Weight Bearing Per Provider Order: Weight bearing as tolerated     Mobility  Bed Mobility Overal bed mobility: Needs Assistance Bed Mobility: Supine to Sit     Supine to sit: Mod assist, HOB elevated, Used rails     General bed mobility comments: Slight dizziness while ambulating most likely due pain  meds    Transfers Overall transfer level: Needs assistance Equipment used: Rolling walker (2 wheels) Transfers: Sit to/from Stand Sit to Stand: Min assist, From elevated surface (ModA from lower surface)           General transfer comment: Improved ability to transfer this am    Ambulation/Gait Ambulation/Gait assistance: Contact guard assist Gait Distance (Feet): 85 Feet Assistive device: Rolling walker (2 wheels) Gait Pattern/deviations: Step-to pattern, Antalgic Gait velocity: decreased     General Gait Details: Repeated vc's to keep pt on task and complete proper sequencing. Pt very easily distracted   Stairs             Wheelchair Mobility     Tilt Bed    Modified Rankin (Stroke Patients Only)       Balance Overall balance assessment: Needs assistance Sitting-balance support: No upper extremity supported, Feet supported Sitting balance-Leahy Scale: Good     Standing balance support: Bilateral upper extremity supported, Reliant on assistive device for balance, During functional activity Standing balance-Leahy Scale: Fair                              Cognition Arousal: Alert Behavior During Therapy: WFL for tasks assessed/performed Overall Cognitive Status: Impaired/Different from baseline Area of Impairment: Attention, Memory, Following commands, Safety/judgement, Awareness, Orientation                 Orientation Level: Disoriented to, Time, Situation Current Attention Level: Focused Memory: Decreased short-term memory, Decreased recall of precautions Following Commands: Follows one step commands with increased time Safety/Judgement:  Decreased awareness of safety, Decreased awareness of deficits Awareness: Intellectual   General Comments: 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 R ankle tight only reaching neutral Other Exercises: Pt educated on importance of regular functonal activity during hospital stay, safe transfer technique, and falls prevention strategies.    General Comments General comments (skin integrity, edema, etc.): L hip dressings intact with minimal drainage, no noteable edema      Pertinent Vitals/Pain Pain Assessment Pain Assessment: Faces Faces Pain Scale: Hurts little more Pain Location: L hip/thigh Pain Descriptors / Indicators: Aching, Sore, Tender, Discomfort Pain Intervention(s): Premedicated before session    Home Living                          Prior Function            PT Goals (current goals can now be found in the care plan section) Acute Rehab PT Goals Patient Stated Goal: "Get stronger so I can go home with my husband who is a BKA." Progress towards PT goals: Progressing toward goals    Frequency    7X/week      PT Plan      Co-evaluation              AM-PAC PT "6 Clicks" Mobility   Outcome Measure  Help needed turning from your back to your side while in a flat bed without using bedrails?: A Little Help needed moving from lying on your back to sitting on the side of a flat bed without using bedrails?: A Lot Help needed moving to and from a bed to a chair (including a wheelchair)?: A Little Help needed standing up from a chair using your arms (e.g., wheelchair or bedside chair)?: A Little Help needed to walk in hospital room?: A Little Help needed climbing 3-5 steps with a railing? : Total 6 Click Score: 15    End of Session Equipment Utilized During Treatment: Gait belt Activity Tolerance: Patient tolerated treatment well Patient left: in chair;with call bell/phone within reach;with chair alarm set;with family/visitor  present Nurse Communication: Mobility status PT Visit Diagnosis: Other abnormalities of gait and mobility (R26.89);Muscle weakness (generalized) (M62.81);History of falling (Z91.81);Pain Pain - Right/Left: Left Pain - part of body: Hip     Time: 0272-5366 PT Time Calculation (min) (ACUTE ONLY): 31 min  Charges:    $Gait Training: 8-22 mins $Therapeutic Exercise: 8-22 mins PT General Charges $$ ACUTE PT VISIT: 1 Visit                    Zadie Cleverly, PTA  Jannet Askew 10/14/2023, 11:33 AM

## 2023-10-15 DIAGNOSIS — R531 Weakness: Secondary | ICD-10-CM | POA: Diagnosis not present

## 2023-10-15 DIAGNOSIS — G4489 Other headache syndrome: Secondary | ICD-10-CM | POA: Diagnosis not present

## 2023-10-15 DIAGNOSIS — S0003XA Contusion of scalp, initial encounter: Secondary | ICD-10-CM | POA: Diagnosis not present

## 2023-10-15 DIAGNOSIS — R0602 Shortness of breath: Secondary | ICD-10-CM | POA: Diagnosis not present

## 2023-10-15 DIAGNOSIS — E039 Hypothyroidism, unspecified: Secondary | ICD-10-CM | POA: Diagnosis not present

## 2023-10-15 DIAGNOSIS — W19XXXA Unspecified fall, initial encounter: Secondary | ICD-10-CM | POA: Diagnosis not present

## 2023-10-15 DIAGNOSIS — R2689 Other abnormalities of gait and mobility: Secondary | ICD-10-CM | POA: Diagnosis not present

## 2023-10-15 DIAGNOSIS — S72142A Displaced intertrochanteric fracture of left femur, initial encounter for closed fracture: Secondary | ICD-10-CM | POA: Diagnosis not present

## 2023-10-15 DIAGNOSIS — M542 Cervicalgia: Secondary | ICD-10-CM | POA: Diagnosis not present

## 2023-10-15 DIAGNOSIS — W010XXA Fall on same level from slipping, tripping and stumbling without subsequent striking against object, initial encounter: Secondary | ICD-10-CM | POA: Diagnosis not present

## 2023-10-15 DIAGNOSIS — M6259 Muscle wasting and atrophy, not elsewhere classified, multiple sites: Secondary | ICD-10-CM | POA: Diagnosis not present

## 2023-10-15 DIAGNOSIS — R41841 Cognitive communication deficit: Secondary | ICD-10-CM | POA: Diagnosis not present

## 2023-10-15 DIAGNOSIS — Z4789 Encounter for other orthopedic aftercare: Secondary | ICD-10-CM | POA: Diagnosis not present

## 2023-10-15 DIAGNOSIS — I959 Hypotension, unspecified: Secondary | ICD-10-CM | POA: Diagnosis not present

## 2023-10-15 DIAGNOSIS — S0990XA Unspecified injury of head, initial encounter: Secondary | ICD-10-CM | POA: Diagnosis not present

## 2023-10-15 DIAGNOSIS — I10 Essential (primary) hypertension: Secondary | ICD-10-CM | POA: Diagnosis not present

## 2023-10-15 DIAGNOSIS — I6782 Cerebral ischemia: Secondary | ICD-10-CM | POA: Diagnosis not present

## 2023-10-15 DIAGNOSIS — Z7401 Bed confinement status: Secondary | ICD-10-CM | POA: Diagnosis not present

## 2023-10-15 DIAGNOSIS — M6281 Muscle weakness (generalized): Secondary | ICD-10-CM | POA: Diagnosis not present

## 2023-10-15 DIAGNOSIS — S72145A Nondisplaced intertrochanteric fracture of left femur, initial encounter for closed fracture: Secondary | ICD-10-CM | POA: Diagnosis not present

## 2023-10-15 DIAGNOSIS — S7292XD Unspecified fracture of left femur, subsequent encounter for closed fracture with routine healing: Secondary | ICD-10-CM | POA: Diagnosis not present

## 2023-10-15 DIAGNOSIS — M25572 Pain in left ankle and joints of left foot: Secondary | ICD-10-CM | POA: Diagnosis not present

## 2023-10-15 DIAGNOSIS — S199XXA Unspecified injury of neck, initial encounter: Secondary | ICD-10-CM | POA: Diagnosis not present

## 2023-10-15 DIAGNOSIS — I480 Paroxysmal atrial fibrillation: Secondary | ICD-10-CM | POA: Diagnosis not present

## 2023-10-15 DIAGNOSIS — G319 Degenerative disease of nervous system, unspecified: Secondary | ICD-10-CM | POA: Diagnosis not present

## 2023-10-15 DIAGNOSIS — R4189 Other symptoms and signs involving cognitive functions and awareness: Secondary | ICD-10-CM | POA: Diagnosis not present

## 2023-10-15 DIAGNOSIS — I4891 Unspecified atrial fibrillation: Secondary | ICD-10-CM | POA: Diagnosis not present

## 2023-10-15 DIAGNOSIS — S79911A Unspecified injury of right hip, initial encounter: Secondary | ICD-10-CM | POA: Diagnosis not present

## 2023-10-15 DIAGNOSIS — Z741 Need for assistance with personal care: Secondary | ICD-10-CM | POA: Diagnosis not present

## 2023-10-15 DIAGNOSIS — R001 Bradycardia, unspecified: Secondary | ICD-10-CM | POA: Diagnosis not present

## 2023-10-15 DIAGNOSIS — E782 Mixed hyperlipidemia: Secondary | ICD-10-CM | POA: Diagnosis not present

## 2023-10-15 DIAGNOSIS — Z7901 Long term (current) use of anticoagulants: Secondary | ICD-10-CM | POA: Diagnosis not present

## 2023-10-15 LAB — BASIC METABOLIC PANEL
Anion gap: 10 (ref 5–15)
BUN: 23 mg/dL (ref 8–23)
CO2: 24 mmol/L (ref 22–32)
Calcium: 9.1 mg/dL (ref 8.9–10.3)
Chloride: 103 mmol/L (ref 98–111)
Creatinine, Ser: 0.64 mg/dL (ref 0.44–1.00)
GFR, Estimated: >60 mL/min (ref >60)
Glucose, Bld: 93 mg/dL (ref 70–99)
Potassium: 3.9 mmol/L (ref 3.5–5.1)
Sodium: 137 mmol/L (ref 135–145)

## 2023-10-15 LAB — CBC
HCT: 32.6 % — ABNORMAL LOW (ref 36.0–46.0)
Hemoglobin: 10.8 g/dL — ABNORMAL LOW (ref 12.0–15.0)
MCH: 33.9 pg (ref 26.0–34.0)
MCHC: 33.1 g/dL (ref 30.0–36.0)
MCV: 102.2 fL — ABNORMAL HIGH (ref 80.0–100.0)
Platelets: 239 10*3/uL (ref 150–400)
RBC: 3.19 MIL/uL — ABNORMAL LOW (ref 3.87–5.11)
RDW: 11.7 % (ref 11.5–15.5)
WBC: 7.7 10*3/uL (ref 4.0–10.5)
nRBC: 0 % (ref 0.0–0.2)

## 2023-10-15 LAB — PHOSPHORUS: Phosphorus: 3.9 mg/dL (ref 2.5–4.6)

## 2023-10-15 MED ORDER — AMIODARONE HCL 200 MG PO TABS: ORAL_TABLET | ORAL | 0 refills | Status: DC

## 2023-10-15 MED ORDER — APIXABAN 5 MG PO TABS: 5.0000 mg | ORAL_TABLET | Freq: Two times a day (BID) | ORAL | 0 refills | Status: DC

## 2023-10-15 MED ORDER — AMIODARONE HCL 200 MG PO TABS: 200.0000 mg | ORAL_TABLET | Freq: Every day | ORAL | 0 refills | Status: DC

## 2023-10-15 NOTE — Progress Notes (Signed)
Subjective: POD11 Left femur IM nail placement for intertrochanteric fracture Patient reports mild pain this AM. Patient is well, and has had no acute complaints or problems Plan is to go Rehab after hospital stay. Negative for chest pain and shortness of breath.  Currently on tele for A. Fib. Fever: no Gastrointestinal:Negative for nausea and vomiting, still working on BM  Objective: Vital signs in last 24 hours: Temp:  [98.1 F (36.7 C)-98.9 F (37.2 C)] 98.1 F (36.7 C) (01/23 0427) Pulse Rate:  [57-60] 60 (01/23 0427) Resp:  [14-16] 16 (01/23 0427) BP: (98-127)/(54-56) 127/55 (01/23 0427) SpO2:  [98 %-99 %] 99 % (01/23 0427) Weight:  [64 kg] 64 kg (01/23 0427)  Intake/Output from previous day:  Intake/Output Summary (Last 24 hours) at 10/15/2023 0732 Last data filed at 10/14/2023 1002 Gross per 24 hour  Intake 240 ml  Output 300 ml  Net -60 ml    Intake/Output this shift: No intake/output data recorded.  Labs: Recent Labs    10/13/23 0441 10/14/23 0438 10/15/23 0421  HGB 10.8* 11.4* 10.8*   Recent Labs    10/14/23 0438 10/15/23 0421  WBC 7.7 7.7  RBC 3.37* 3.19*  HCT 33.8* 32.6*  PLT 230 239   Recent Labs    10/14/23 0438 10/15/23 0421  NA 137 137  K 4.1 3.9  CL 103 103  CO2 26 24  BUN 20 23  CREATININE 0.53 0.64  GLUCOSE 98 93  CALCIUM 9.4 9.1   No results for input(s): "LABPT", "INR" in the last 72 hours.  EXAM General - Patient is Alert and Confused Extremity - ABD soft Neurovascular intact Sensation intact distally Dorsiflexion/Plantar flexion intact No cellulitis present Compartment soft Dressing/Incision - clean, dry, no drainage noted to the incision sites.  Honeycomb dressings intact this AM. Staples were removed this AM without issue.  Benzoin and steri-strips applied. Motor Function - intact, moving foot and toes well on exam.  Abdomen soft with intact bowel sounds.  Past Medical History:  Diagnosis Date   Allergy     Anemia    Anxiety    Arthritis    Cancer (HCC)    skin cancer   Hyperlipidemia    Hypertension    Hypothyroidism    Sciatica    Sleep apnea    does not use cpap   Thrombocytopenia (HCC)    Thyroid disease     Assessment/Plan: POD11 Left femur IM nail placement for intertrochanteric fracture 7 Days Post-Op Procedure(s) (LRB): CARDIOVERSION (N/A) Principal Problem:   Closed left femoral fracture (HCC) Active Problems:   Hypertension   Anxiety   Psychophysiological insomnia   Acquired hypothyroidism   Hyperlipidemia   RLS (restless legs syndrome)   Overweight with body mass index (BMI) of 26 to 26.9 in adult   Thrombocytopenia (HCC)   Left ankle pain   Atrial fibrillation with rapid ventricular response (HCC)   Closed fracture of left hip (HCC)   SOB (shortness of breath)  Estimated body mass index is 26.66 kg/m as calculated from the following:   Height as of this encounter: 5\' 1"  (1.549 m).   Weight as of this encounter: 64 kg. Advance diet Up with therapy  Labs and vitals revewed.  No recent fever. WBC 7.7 this AM, Hg 10.8 today. Up with therapy. Continue to work on a BM. Staples were removed today without issue.  Continue with plan for d/c to SNF. Follow-up with [facility] in one month for x-rays of the left femur.  Ortho will sign off at this time.  DVT Prophylaxis - TED hose and Eliquis Weight-Bearing as tolerated to Left leg  J. Horris Latino, PA-C, CAQ-OS Orthopaedic Surgery 10/15/2023, 7:32 AM

## 2023-10-15 NOTE — Discharge Summary (Addendum)
Physician Discharge Summary  [name] UEA:[id number] DOB: Oct 14, 1940 DOA: 10/03/2023  PCP: [name], NP  Admit date: 10/03/2023 Discharge date: 10/15/2023  Admitted From: home  Disposition:  SNF  Recommendations for Outpatient Follow-up:  Follow up with PCP in 1-2 weeks F/u w/ ortho surg, Dr. Joice Lofts or PA Marney Doctor, in 1-2 weeks F/u w/ cardio, Dr. Okey Dupre, in 1-2 weeks  Home Health: no  Equipment/Devices:  Discharge Condition: stable  CODE STATUS: full  Diet recommendation: Heart Healthy  Brief/Interim Summary: HPI was taken from Dr. Sedalia Muta: [name] with hypertension, hyperlipidemia, depression, anxiety, hypothyroid, anemia, who presents ED for chief concerns of falling at [employer] parking lot.   Per ED documentation, patient was still able to get into her car and drove home for help.  EMS came to her home and at that time she was not able to get out of the car.   Vitals in the ED showed temperature of 98.6, respiration rate of 14, heart rate of 87, blood pressure 152/74, SpO2 of 96% on room air.   Serum sodium is 138, potassium 3.4, chloride 104, bicarb 23, BUN of 15, serum creatinine 0.59, EGFR greater than 60, nonfasting blood glucose 99, WBC 11.7, platelets of 130, hemoglobin 15.8.   ED treatment: Fentanyl 50 mcg IV one-time dose. ------------------------------------- Patient was able to tell me her first and last name, age, location, current calendar year.   She reports that she was going to [employer] in order to get her Lipton tea. She states that she had ran out. She then slipped on ice at the grocery store and then somehow got back into the car and drove home. Upon arrival at home, she was no longer able to get out of the car due to the pain. EMS helped her out of the car and brought her to the emergency department.   Ultimately she was not able to obtain her Lipton tea.  As per Dr. Meriam Sprague:  [name] with past medical history of  essential hypertension, hyperlipidemia, depression, anxiety, hypothyroidism and anemia presents after a mechanical fall while she was in [employer] parking lot. She was however able to get into her car and was able to drive home but could not get out of the car at home. EMS was called. She was brought into the emergency department. Found to have left hip fracture. She was hospitalized for further management.   Discharge Diagnoses:  Principal Problem:   Closed left femoral fracture (HCC) Active Problems:   Hypertension   Anxiety   Psychophysiological insomnia   Acquired hypothyroidism   Hyperlipidemia   RLS (restless legs syndrome)   Overweight with body mass index (BMI) of 26 to 26.9 in adult   Thrombocytopenia (HCC)   Left ankle pain   Atrial fibrillation with rapid ventricular response (HCC)   Closed fracture of left hip (HCC)   SOB (shortness of breath)  Left hip fracture: s/p reduction & internal fixation of intertrochanteric left hip fracture. PT/OT recs SNF. Pt is agreeable to SNF.  Likely delirium: CT head shows no acute intracranial abnormalities. Resolved. Mental status is back to baseline   A. fib: w/ RVR. New onset. Continue on amio, metoprolol, eliquis. S/p cardioversion on 10/08/23  Cardio recs apprec    Left ankle pain: imaging neg for fracture. Norco prn    HLD: continue on statin    Hypothyroidism: continue on levothyroxine    HTN: continue on metoprolol   Hypokalemia: WNL today  Thrombocytopenia: WNL today  Discharge Instructions  Discharge Instructions     Diet - low sodium heart healthy   Complete by: As directed    Discharge instructions   Complete by: As directed    F/u w/ cardio, Dr. Okey Dupre, in 1-2 weeks. F/u w/ ortho surg, Dr. Joice Lofts, in 1-2 weeks. F/u w/ PCP in 1-2 weeks   Increase activity slowly   Complete by: As directed       Allergies as of 10/15/2023       Reactions   Codeine Nausea Only   Tolerates oxycodone   Morphine Nausea And  Vomiting   Ace Inhibitors Cough   Adhesive [tape] Other (See Comments), Rash   whelps        Medication List     STOP taking these medications    aspirin EC 81 MG tablet   losartan 100 MG tablet Commonly known as: COZAAR       TAKE these medications    acetaminophen 500 MG tablet Commonly known as: TYLENOL Take 500 mg by mouth every 6 (six) hours as needed for moderate pain.   amiodarone 200 MG tablet Commonly known as: PACERONE Take 1 tablet (200 mg total) by mouth daily.   amLODipine 10 MG tablet Commonly known as: NORVASC TAKE 1 TABLET(10 MG) BY MOUTH DAILY   apixaban 5 MG Tabs tablet Commonly known as: ELIQUIS Take 1 tablet (5 mg total) by mouth 2 (two) times daily.   atorvastatin 40 MG tablet Commonly known as: LIPITOR TAKE 1 TABLET(40 MG) BY MOUTH DAILY   citalopram 20 MG tablet Commonly known as: CELEXA TAKE 1 TABLET(20 MG) BY MOUTH DAILY   HYDROcodone-acetaminophen 5-325 MG tablet Commonly known as: NORCO/VICODIN Take 1 tablet by mouth every 6 (six) hours as needed for severe pain (pain score 7-10).   levothyroxine 150 MCG tablet Commonly known as: SYNTHROID TAKE 1 TABLET(150 MCG) BY MOUTH DAILY   metoprolol succinate 50 MG 24 hr tablet Commonly known as: TOPROL-XL TAKE 1 TABLET BY MOUTH EVERY DAY WITH OR IMMEDIATELY FOLLOWING A MEAL   multivitamin capsule Take 1 capsule by mouth daily.   traZODone 50 MG tablet Commonly known as: DESYREL TAKE 1 TABLET(50 MG) BY MOUTH AT BEDTIME AS NEEDED FOR SLEEP        Contact information for follow-up providers     Anson [place], PA-C Follow up in 4 week(s).   Specialty: Physician Assistant Contact information: [address] [place] [address] [phone]         Yvonne Kendall, MD Follow up.   Specialty: Cardiology Why: F/u in 1-2 weeks Contact information: [address] [place] [address] [phone]         [name], NP Follow up.    Specialties: Internal Medicine, Emergency Medicine Why: F/u in 1-2 weeks Contact information: [address] [place] [address] [phone]              Contact information for after-discharge care     Destination     [facility] Preferred SNF .   Service: Skilled Nursing Contact information: [address] [place] [address] [phone]                    Allergies  Allergen Reactions   Codeine Nausea Only    Tolerates oxycodone   Morphine Nausea And Vomiting   Ace Inhibitors Cough   Adhesive [Tape] Other (See Comments) and  Rash    whelps    Consultations: Ortho surg Cardio    Procedures/Studies: CT HEAD WO CONTRAST ( ) Result Date: 10/08/2023 CLINICAL DATA:  Mental status change, persistent or worsening EXAM: CT HEAD WITHOUT CONTRAST TECHNIQUE: Contiguous axial images were obtained from the base of the skull through the vertex without intravenous contrast. RADIATION DOSE REDUCTION: This exam was performed according to the departmental dose-optimization program which includes automated exposure control, adjustment of the mA and/or kV according to patient size and/or use of iterative reconstruction technique. COMPARISON:  CT head 08/26/2012. FINDINGS: Brain: No evidence of acute large vascular territory infarction, hemorrhage, hydrocephalus, extra-axial collection or mass lesion/mass effect. Patchy white matter hypodensities are nonspecific but compatible with chronic microvascular ischemic disease. Vascular: No hyperdense vessel.  Calcific atherosclerosis. Skull: No acute fracture. Sinuses/Orbits: Clear sinuses.  No acute orbital findings. Other: No mastoid effusions. IMPRESSION: 1. No evidence of acute intracranial abnormality. 2. Chronic microvascular ischemic disease. Electronically Signed   By: Feliberto Harts M.D.   On: 10/08/2023 22:45   ECHOCARDIOGRAM COMPLETE Result Date: 10/05/2023     ECHOCARDIOGRAM REPORT   Patient Name:   DEZZIE AGRAMONTE Date of Exam: 10/05/2023 Medical Rec #:  [id number]         Height:       61.0 in Accession #:    [id number]        Weight:       143.3 lb Date of Birth:  1941/09/06         BSA:          1.639 m Patient Age:    82 years          BP:           141/91 mmHg Patient Gender: F                 HR:           115 bpm. Exam Location:  [facility] Procedure: 2D Echo, Cardiac Doppler and Color Doppler Indications:     Atrial Fibrillation I48.91  History:         Patient has no prior history of Echocardiogram examinations.                  Risk Factors:Hypertension and Dyslipidemia. Anxiety.  Sonographer:     Cristela Blue Referring Phys:  [id number] SHERI HAMMOCK Diagnosing Phys: Lorine Bears MD IMPRESSIONS  1. Left ventricular ejection fraction, by estimation, is 50 to 55%. The left ventricle has low normal function. Left ventricular endocardial border not optimally defined to evaluate regional wall motion. There is mild left ventricular hypertrophy. Left ventricular diastolic parameters are indeterminate.  2. Right ventricular systolic function is normal. The right ventricular size is normal. There is normal pulmonary artery systolic pressure.  3. Left atrial size was severely dilated.  4. The mitral valve is normal in structure. Mild mitral valve regurgitation. No evidence of mitral stenosis.  5. Tricuspid valve regurgitation is mild to moderate.  6. 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 Aortic valve regurgitation is not visualized. Aortic valve sclerosis/calcification is present, without any evidence of aortic stenosis.  7. The inferior vena cava is normal in size with greater than 50% respiratory variability, suggesting right atrial pressure of 3 mmHg. FINDINGS  Left Ventricle: Left ventricular ejection fraction, by estimation, is 50 to 55%. The left ventricle has low normal function. Left ventricular endocardial border not optimally defined to evaluate regional wall motion. The left  ventricular internal cavity  size was  normal in size. There is mild left ventricular hypertrophy. Left ventricular diastolic parameters are indeterminate. Right Ventricle: The right ventricular size is normal. No increase in right ventricular wall thickness. Right ventricular systolic function is normal. There is normal pulmonary artery systolic pressure. The tricuspid regurgitant velocity is 2.76 m/s, and  with an assumed right atrial pressure of 3 mmHg, the estimated right ventricular systolic pressure is 33.5 mmHg. Left Atrium: Left atrial size was severely dilated. Right Atrium: Right atrial size was normal in size. Pericardium: There is no evidence of pericardial effusion. Mitral Valve: The mitral valve is normal in structure. Mild mitral annular calcification. Mild mitral valve regurgitation. No evidence of mitral valve stenosis. Tricuspid Valve: The tricuspid valve is normal in structure. Tricuspid valve regurgitation is mild to moderate. No evidence of tricuspid stenosis. Aortic Valve: The aortic valve is bicuspid. Aortic valve regurgitation is not visualized. Aortic valve sclerosis/calcification is present, without any evidence of aortic stenosis. Aortic valve mean gradient measures 2.0 mmHg. Aortic valve peak gradient measures 3.2 mmHg. Aortic valve area, by VTI measures 3.74 cm. Pulmonic Valve: The pulmonic valve was normal in structure. Pulmonic valve regurgitation is not visualized. No evidence of pulmonic stenosis. Aorta: The aortic root is normal in size and structure. Venous: The inferior vena cava is normal in size with greater than 50% respiratory variability, suggesting right atrial pressure of 3 mmHg. IAS/Shunts: No atrial level shunt detected by color flow Doppler.  LEFT VENTRICLE PLAX 2D LVIDd:         3.50 cm LVIDs:         2.30 cm LV PW:         1.20 cm LV IVS:        1.30 cm LVOT diam:     2.00 cm LV SV:         47 LV SV Index:   29 LVOT Area:     3.14 cm  RIGHT VENTRICLE RV Basal diam:   3.10 cm RV Mid diam:    2.20 cm LEFT ATRIUM             Index        RIGHT ATRIUM           Index LA diam:        5.10 cm 3.11 cm/m   RA Area:     12.10 cm LA Vol (A2C):   53.6 ml 32.70 ml/m  RA Volume:   25.40 ml  15.50 ml/m LA Vol (A4C):   26.6 ml 16.23 ml/m LA Biplane Vol: 40.2 ml 24.52 ml/m  AORTIC VALVE AV Area (Vmax):    3.10 cm AV Area (Vmean):   3.24 cm AV Area (VTI):     3.74 cm AV Vmax:           89.85 cm/s AV Vmean:          63.500 cm/s AV VTI:            0.125 m AV Peak Grad:      3.2 mmHg AV Mean Grad:      2.0 mmHg LVOT Vmax:         88.60 cm/s LVOT Vmean:        65.500 cm/s LVOT VTI:          0.149 m LVOT/AV VTI ratio: 1.19  AORTA Ao Root diam: 2.80 cm MITRAL VALVE               TRICUSPID VALVE MV Area (PHT): 3.89 cm  TR Peak grad:   30.5 mmHg MV Decel Time: 195 msec    TR Vmax:        276.00 cm/s MV E velocity: 90.10 cm/s                            SHUNTS                            Systemic VTI:  0.15 m                            Systemic Diam: 2.00 cm Lorine Bears MD Electronically signed by Lorine Bears MD Signature Date/Time: 10/05/2023/7:37:53 PM    Final    DG HIP UNILAT WITH PELVIS 2-3 VIEWS LEFT Result Date: 10/04/2023 CLINICAL DATA:  Elective surgery. EXAM: DG HIP (WITH OR WITHOUT PELVIS) 2-3V LEFT COMPARISON:  Preoperative imaging. FINDINGS: Four fluoroscopic spot views of the pelvis and left hip/femur obtained in the operating room. Images during hip arthroplasty. Fluoroscopy time 37 seconds. Dose 3.9411 mGy. IMPRESSION: Intraoperative fluoroscopy during left hip fracture fixation. Electronically Signed   By: Narda Rutherford M.D.   On: 10/04/2023 15:56   DG C-Arm 1-60 Min-No Report Result Date: 10/04/2023 Fluoroscopy was utilized by the requesting physician.  No radiographic interpretation.   DG C-Arm 1-60 Min-No Report Result Date: 10/04/2023 Fluoroscopy was utilized by the requesting physician.  No radiographic interpretation.   DG Ankle 2 Views Left Result  Date: 10/03/2023 CLINICAL DATA:  Left ankle pain after a fall. EXAM: LEFT ANKLE - 2 VIEW COMPARISON:  None Available. FINDINGS: The left ankle is located. Soft tissue swelling is present at the medial malleolus. A joint effusion is present. No underlying fracture is present. IMPRESSION: 1. Soft tissue swelling at the medial malleolus without underlying fracture. 2. Joint effusion. Electronically Signed   By: Marin Roberts M.D.   On: 10/03/2023 18:55   DG Chest 1 View Result Date: 10/03/2023 CLINICAL DATA:  Fall. EXAM: CHEST  1 VIEW COMPARISON:  Chest x-ray dated Feb 16, 2017. FINDINGS: The heart is at the upper limits of normal in size. Normal pulmonary vascularity. Negative three No acute osseous abnormality. IMPRESSION: 1. No active disease. Electronically Signed   By: Obie Dredge M.D.   On: 10/03/2023 17:01   DG Knee 2 Views Left Result Date: 10/03/2023 CLINICAL DATA:  Left leg pain after fall. EXAM: LEFT KNEE - 1-2 VIEW COMPARISON:  Left knee x-rays dated November 16, 2008. FINDINGS: Prior total knee arthroplasty. No evidence of hardware failure or loosening. No acute fracture or dislocation. No joint effusion. IMPRESSION: 1. No acute osseous abnormality. Electronically Signed   By: Obie Dredge M.D.   On: 10/03/2023 17:00   DG Hip Unilat With Pelvis 2-3 Views Left Result Date: 10/03/2023 CLINICAL DATA:  Left leg pain after fall. EXAM: DG HIP (WITH OR WITHOUT PELVIS) 2-3V LEFT COMPARISON:  Left hip x-rays dated November 27, 2010. FINDINGS: Acute nondisplaced intertrochanteric fracture of the left femur. No dislocation. Osteopenia. Soft tissues are unremarkable. IMPRESSION: 1. Acute nondisplaced intertrochanteric left femur fracture. Electronically Signed   By: Obie Dredge M.D.   On: 10/03/2023 17:00   (Echo, Carotid, EGD, Colonoscopy, ERCP)    Subjective: Pt c/o fatigue    Discharge Exam: Vitals:   10/15/23 0831 10/15/23 1226  BP: (!) 114/52 (!) 103/52  Pulse: (!) 54 62  Resp:  18 18  Temp: 98.7 F (37.1 C) 99.1 F (37.3 C)  SpO2: 99% 99%   Vitals:   10/14/23 2000 10/15/23 0427 10/15/23 0831 10/15/23 1226  BP: (!) 127/55 (!) 127/55 (!) 114/52 (!) 103/52  Pulse: (!) 57 60 (!) 54 62  Resp: 14 16 18 18   Temp: 98.3 F (36.8 C) 98.1 F (36.7 C) 98.7 F (37.1 C) 99.1 F (37.3 C)  TempSrc: Oral Oral    SpO2: 99% 99% 99% 99%  Weight:  64 kg    Height:        General: Pt is alert, awake, not in acute distress Cardiovascular: S1/S2 +, no rubs, no gallops Respiratory: CTA bilaterally, no wheezing, no rhonchi Abdominal: Soft, NT, ND, bowel sounds + Extremities:  no cyanosis    The results of significant diagnostics from this hospitalization (including imaging, microbiology, ancillary and laboratory) are listed below for reference.     Microbiology: No results found for this or any previous visit (from the past 240 hours).   Labs: BNP (last 3 results) No results for input(s): "BNP" in the last 8760 hours. Basic Metabolic Panel: Recent Labs  Lab 10/09/23 0210 10/10/23 0433 10/11/23 0428 10/12/23 0520 10/13/23 0441 10/14/23 0438 10/15/23 0421  NA 136  --   --  136 134* 137 137  K 4.0  --   --  3.8 4.0 4.1 3.9  CL 106  --   --  103 103 103 103  CO2 23  --   --  25 21* 26 24  GLUCOSE 95  --   --  94 86 98 93  BUN 17  --   --  18 19 20 23   CREATININE 0.68  --   --  0.61 0.54 0.53 0.64  CALCIUM 8.9  --   --  9.1 8.8* 9.4 9.1  PHOS 4.1   < > 3.8 3.5 3.2 3.9 3.9   < > = values in this interval not displayed.   Liver Function Tests: No results for input(s): "AST", "ALT", "ALKPHOS", "BILITOT", "PROT", "ALBUMIN" in the last 168 hours. No results for input(s): "LIPASE", "AMYLASE" in the last 168 hours. No results for input(s): "AMMONIA" in the last 168 hours. CBC: Recent Labs  Lab 10/09/23 0210 10/12/23 0520 10/13/23 0441 10/14/23 0438 10/15/23 0421  WBC 7.9 8.6 8.4 7.7 7.7  NEUTROABS  --  5.1 5.0 4.4  --   HGB 10.5* 11.0* 10.8* 11.4* 10.8*   HCT 30.4* 32.4* 32.5* 33.8* 32.6*  MCV 97.7 100.6* 101.2* 100.3* 102.2*  PLT 100* 172 138* 230 239   Cardiac Enzymes: No results for input(s): "CKTOTAL", "CKMB", "CKMBINDEX", "TROPONINI" in the last 168 hours. BNP: Invalid input(s): "POCBNP" CBG: Recent Labs  Lab 10/08/23 2019 10/08/23 2153  GLUCAP 243* 237*   D-Dimer No results for input(s): "DDIMER" in the last 72 hours. Hgb A1c No results for input(s): "HGBA1C" in the last 72 hours. Lipid Profile No results for input(s): "CHOL", "HDL", "LDLCALC", "TRIG", "CHOLHDL", "LDLDIRECT" in the last 72 hours. Thyroid function studies No results for input(s): "TSH", "T4TOTAL", "T3FREE", "THYROIDAB" in the last 72 hours.  Invalid input(s): "FREET3" Anemia work up No results for input(s): "VITAMINB12", "FOLATE", "FERRITIN", "TIBC", "IRON", "RETICCTPCT" in the last 72 hours. Urinalysis    Component Value Date/Time   COLORURINE AMBER (A) 07/18/2022 0936   APPEARANCEUR CLOUDY (A) 07/18/2022 0936   APPEARANCEUR Cloudy (A) 12/17/2017 1551   LABSPEC 1.020 07/18/2022 0936   PHURINE 5.0 07/18/2022 1610  GLUCOSEU NEGATIVE 07/18/2022 0936   HGBUR NEGATIVE 07/18/2022 0936   BILIRUBINUR NEGATIVE 07/18/2022 0936   BILIRUBINUR Negative 12/01/2019 1156   BILIRUBINUR Negative 12/17/2017 1551   KETONESUR 5 (A) 07/18/2022 0936   PROTEINUR 30 (A) 07/18/2022 0936   UROBILINOGEN 0.2 12/01/2019 1156   UROBILINOGEN 1.0 08/03/2015 1501   NITRITE NEGATIVE 07/18/2022 0936   LEUKOCYTESUR NEGATIVE 07/18/2022 0936   Sepsis Labs Recent Labs  Lab 10/12/23 0520 10/13/23 0441 10/14/23 0438 10/15/23 0421  WBC 8.6 8.4 7.7 7.7   Microbiology No results found for this or any previous visit (from the past 240 hours).   Time coordinating discharge: Over 30 minutes  SIGNED:   Charise Killian, MD  [facility] 10/15/2023, 2:30 PM Pager   If 7PM-7AM, please contact night-coverage [email or web address]

## 2023-10-15 NOTE — TOC Transition Note (Signed)
Transition of Care [facility]) - Discharge Note   Patient Details  Name: [name] MRN: [id number] Date of Birth: [birth year]  [facility]) CM/SW Contact:  Truddie Hidden, RN Phone Number: 10/15/2023, 12:12 PM   Clinical Narrative:    HTA auth approved for seven days #[id number] [place] from [place] notified. Patient can admit today. MD notified.       Barriers to Discharge: Continued Medical Work up   Patient Goals and CMS Choice            Discharge Placement                       Discharge Plan and Services Additional resources added to the After Visit Summary for       Post Acute Care Choice: Skilled Nursing Facility                               Social Drivers of Health (SDOH) Interventions SDOH Screenings   Food Insecurity: No Food Insecurity (10/04/2023)  Housing: Low Risk  (10/04/2023)  Transportation Needs: No Transportation Needs (10/04/2023)  Utilities: Not At Risk (10/04/2023)  Alcohol Screen: Low Risk  (12/26/2022)  Depression (PHQ2-9): Medium Risk (09/30/2023)  Financial Resource Strain: Low Risk  (05/16/2023)   Received from [facility]  Physical Activity: Inactive (02/03/2023)  Social Connections: Moderately Integrated (10/04/2023)  Stress: No Stress Concern Present (08/18/2023)  Tobacco Use: High Risk (10/08/2023)  Health Literacy: Adequate Health Literacy (05/26/2023)     Readmission Risk Interventions     No data to display

## 2023-10-15 NOTE — TOC Transition Note (Signed)
Transition of Care [facility]) - Discharge Note   Patient Details  Name: [name] MRN: [id number] Date of Birth: 11-08-1940  Transition of Care [facility]) CM/SW Contact:  Truddie Hidden, RN Phone Number: 10/15/2023, 2:51 PM   Clinical Narrative:    Spoke with Raoul Pitch in admissions at [facility]  Per facility patient admission confirmed for today. Patient assigned room # 803A Nurse will call report to [phone] Face sheet and medical necessity forms printed to the floor to be added to the EMS pack EMS arranged "She's 8th on the list."  Discharge summary and SNF transfer report sent in [facility].  Nurse, and family notified spoke with her spouse TOC signing off.      Barriers to Discharge: Continued Medical Work up   Patient Goals and CMS Choice            Discharge Placement                       Discharge Plan and Services Additional resources added to the After Visit Summary for       Post Acute Care Choice: Skilled Nursing Facility                               Social Drivers of Health (SDOH) Interventions SDOH Screenings   Food Insecurity: No Food Insecurity (10/04/2023)  Housing: Low Risk  (10/04/2023)  Transportation Needs: No Transportation Needs (10/04/2023)  Utilities: Not At Risk (10/04/2023)  Alcohol Screen: Low Risk  (12/26/2022)  Depression (PHQ2-9): Medium Risk (09/30/2023)  Financial Resource Strain: Low Risk  (05/16/2023)   Received from [facility]  Physical Activity: Inactive (02/03/2023)  Social Connections: Moderately Integrated (10/04/2023)  Stress: No Stress Concern Present (08/18/2023)  Tobacco Use: High Risk (10/08/2023)  Health Literacy: Adequate Health Literacy (05/26/2023)     Readmission Risk Interventions     No data to display

## 2023-10-16 DIAGNOSIS — E039 Hypothyroidism, unspecified: Secondary | ICD-10-CM | POA: Diagnosis not present

## 2023-10-16 DIAGNOSIS — Z7901 Long term (current) use of anticoagulants: Secondary | ICD-10-CM | POA: Diagnosis not present

## 2023-10-16 DIAGNOSIS — M25572 Pain in left ankle and joints of left foot: Secondary | ICD-10-CM | POA: Diagnosis not present

## 2023-10-16 DIAGNOSIS — S72142A Displaced intertrochanteric fracture of left femur, initial encounter for closed fracture: Secondary | ICD-10-CM | POA: Diagnosis not present

## 2023-10-16 DIAGNOSIS — I10 Essential (primary) hypertension: Secondary | ICD-10-CM | POA: Diagnosis not present

## 2023-10-16 DIAGNOSIS — I4891 Unspecified atrial fibrillation: Secondary | ICD-10-CM | POA: Diagnosis not present

## 2023-10-16 DIAGNOSIS — W19XXXA Unspecified fall, initial encounter: Secondary | ICD-10-CM | POA: Diagnosis not present

## 2023-10-19 DIAGNOSIS — M25572 Pain in left ankle and joints of left foot: Secondary | ICD-10-CM | POA: Diagnosis not present

## 2023-10-19 DIAGNOSIS — I4891 Unspecified atrial fibrillation: Secondary | ICD-10-CM | POA: Diagnosis not present

## 2023-10-19 DIAGNOSIS — E039 Hypothyroidism, unspecified: Secondary | ICD-10-CM | POA: Diagnosis not present

## 2023-10-19 DIAGNOSIS — S72142A Displaced intertrochanteric fracture of left femur, initial encounter for closed fracture: Secondary | ICD-10-CM | POA: Diagnosis not present

## 2023-10-19 DIAGNOSIS — W19XXXA Unspecified fall, initial encounter: Secondary | ICD-10-CM | POA: Diagnosis not present

## 2023-10-19 DIAGNOSIS — Z7901 Long term (current) use of anticoagulants: Secondary | ICD-10-CM | POA: Diagnosis not present

## 2023-10-19 DIAGNOSIS — I10 Essential (primary) hypertension: Secondary | ICD-10-CM | POA: Diagnosis not present

## 2023-10-21 ENCOUNTER — Other Ambulatory Visit: Payer: PPO

## 2023-10-21 DIAGNOSIS — E039 Hypothyroidism, unspecified: Secondary | ICD-10-CM | POA: Diagnosis not present

## 2023-10-21 DIAGNOSIS — M25572 Pain in left ankle and joints of left foot: Secondary | ICD-10-CM | POA: Diagnosis not present

## 2023-10-21 DIAGNOSIS — S72142A Displaced intertrochanteric fracture of left femur, initial encounter for closed fracture: Secondary | ICD-10-CM | POA: Diagnosis not present

## 2023-10-21 DIAGNOSIS — W19XXXA Unspecified fall, initial encounter: Secondary | ICD-10-CM | POA: Diagnosis not present

## 2023-10-21 DIAGNOSIS — I10 Essential (primary) hypertension: Secondary | ICD-10-CM | POA: Diagnosis not present

## 2023-10-21 DIAGNOSIS — Z7901 Long term (current) use of anticoagulants: Secondary | ICD-10-CM | POA: Diagnosis not present

## 2023-10-21 DIAGNOSIS — I4891 Unspecified atrial fibrillation: Secondary | ICD-10-CM | POA: Diagnosis not present

## 2023-10-23 DIAGNOSIS — W19XXXA Unspecified fall, initial encounter: Secondary | ICD-10-CM | POA: Diagnosis not present

## 2023-10-23 DIAGNOSIS — S72142A Displaced intertrochanteric fracture of left femur, initial encounter for closed fracture: Secondary | ICD-10-CM | POA: Diagnosis not present

## 2023-10-23 DIAGNOSIS — I10 Essential (primary) hypertension: Secondary | ICD-10-CM | POA: Diagnosis not present

## 2023-10-23 DIAGNOSIS — Z7901 Long term (current) use of anticoagulants: Secondary | ICD-10-CM | POA: Diagnosis not present

## 2023-10-23 DIAGNOSIS — I4891 Unspecified atrial fibrillation: Secondary | ICD-10-CM | POA: Diagnosis not present

## 2023-10-23 DIAGNOSIS — E039 Hypothyroidism, unspecified: Secondary | ICD-10-CM | POA: Diagnosis not present

## 2023-10-23 DIAGNOSIS — M25572 Pain in left ankle and joints of left foot: Secondary | ICD-10-CM | POA: Diagnosis not present

## 2023-10-27 DIAGNOSIS — Z7901 Long term (current) use of anticoagulants: Secondary | ICD-10-CM | POA: Diagnosis not present

## 2023-10-27 DIAGNOSIS — S72142A Displaced intertrochanteric fracture of left femur, initial encounter for closed fracture: Secondary | ICD-10-CM | POA: Diagnosis not present

## 2023-10-27 DIAGNOSIS — W19XXXA Unspecified fall, initial encounter: Secondary | ICD-10-CM | POA: Diagnosis not present

## 2023-10-27 DIAGNOSIS — I10 Essential (primary) hypertension: Secondary | ICD-10-CM | POA: Diagnosis not present

## 2023-10-27 DIAGNOSIS — M25572 Pain in left ankle and joints of left foot: Secondary | ICD-10-CM | POA: Diagnosis not present

## 2023-10-27 DIAGNOSIS — E039 Hypothyroidism, unspecified: Secondary | ICD-10-CM | POA: Diagnosis not present

## 2023-10-27 DIAGNOSIS — I4891 Unspecified atrial fibrillation: Secondary | ICD-10-CM | POA: Diagnosis not present

## 2023-10-28 DIAGNOSIS — W19XXXA Unspecified fall, initial encounter: Secondary | ICD-10-CM | POA: Diagnosis not present

## 2023-10-28 DIAGNOSIS — M25572 Pain in left ankle and joints of left foot: Secondary | ICD-10-CM | POA: Diagnosis not present

## 2023-10-28 DIAGNOSIS — I4891 Unspecified atrial fibrillation: Secondary | ICD-10-CM | POA: Diagnosis not present

## 2023-10-28 DIAGNOSIS — Z7901 Long term (current) use of anticoagulants: Secondary | ICD-10-CM | POA: Diagnosis not present

## 2023-10-28 DIAGNOSIS — I10 Essential (primary) hypertension: Secondary | ICD-10-CM | POA: Diagnosis not present

## 2023-10-28 DIAGNOSIS — E039 Hypothyroidism, unspecified: Secondary | ICD-10-CM | POA: Diagnosis not present

## 2023-10-28 DIAGNOSIS — S72142A Displaced intertrochanteric fracture of left femur, initial encounter for closed fracture: Secondary | ICD-10-CM | POA: Diagnosis not present

## 2023-10-30 ENCOUNTER — Ambulatory Visit: Payer: PPO | Attending: Cardiology | Admitting: Cardiology

## 2023-10-30 ENCOUNTER — Encounter: Payer: Self-pay | Admitting: Cardiology

## 2023-10-30 VITALS — BP 130/82 | HR 47 | Ht 63.0 in | Wt 150.0 lb

## 2023-10-30 DIAGNOSIS — E782 Mixed hyperlipidemia: Secondary | ICD-10-CM | POA: Diagnosis not present

## 2023-10-30 DIAGNOSIS — Z7901 Long term (current) use of anticoagulants: Secondary | ICD-10-CM | POA: Diagnosis not present

## 2023-10-30 DIAGNOSIS — S72142A Displaced intertrochanteric fracture of left femur, initial encounter for closed fracture: Secondary | ICD-10-CM | POA: Diagnosis not present

## 2023-10-30 DIAGNOSIS — W19XXXA Unspecified fall, initial encounter: Secondary | ICD-10-CM | POA: Diagnosis not present

## 2023-10-30 DIAGNOSIS — I4891 Unspecified atrial fibrillation: Secondary | ICD-10-CM | POA: Diagnosis not present

## 2023-10-30 DIAGNOSIS — I10 Essential (primary) hypertension: Secondary | ICD-10-CM

## 2023-10-30 DIAGNOSIS — I480 Paroxysmal atrial fibrillation: Secondary | ICD-10-CM | POA: Diagnosis not present

## 2023-10-30 DIAGNOSIS — E039 Hypothyroidism, unspecified: Secondary | ICD-10-CM | POA: Diagnosis not present

## 2023-10-30 DIAGNOSIS — M25572 Pain in left ankle and joints of left foot: Secondary | ICD-10-CM | POA: Diagnosis not present

## 2023-10-30 NOTE — Progress Notes (Signed)
 Cardiology Office Note:  .   Date:  10/30/2023  ID:  [name], DOB 1941-01-29, MRN [id number] PCP: Antonette Angeline ORN, NP  [facility] Providers Cardiologist:  None    History of Present Illness: .   [name] is a 83 y.o. female with a past medical history of hypertension, hyperlipidemia, hypothyroidism obstructive sleep apnea not on CPAP, anemia, anxiety, arthritis, status post left hip surgery, postoperative atrial fibrillation, who is here today for hospital follow-up.   [name] arrived to the [facility] emergency department via EMS on 10/03/23 after she fell in the [employer] parking lot.  She was able to get into her car with help and drove home.  EMS picked her up at home as she could not get out of the car.  She was complaining of left leg pain primarily in the left knee.  She states that she did not pass out and remembers the fall.  She thinks she just tripped or slipped on black ice.  She denies hitting her head or any other injuries she denied pain anywhere else.    On arrival to the emergency department vital signs were stable.  Blood pressure was noted at 152/74, heart rate of 87, respirations of 14, temperature 98.6, SpO2 96% on room air.  She was awake alert and oriented.  She did good peripheral perfusion.  Pertinent labs revealed a serum sodium 138, potassium 3.4, chloride 104, bicarb 23, BUN 15, serum creatinine 0.59, with a EGFR greater than 60, nonfasting blood glucose 99, WBCs of 11.7, platelets of 130, hemoglobin of 15.8. EKG reveals sinus rhythm with a rate of 92 with no ST elevations.  X-ray of the left hip revealed an acute nondisplaced intertronchanteric left femur fracture.  She was treated with fentanyl .  She was neurovascularly intact.  Hospitalist was called for admission and orthopedics was called.  On 10/04/2023 she underwent surgery with orthopedics for left hip fracture.  Patient did not require further testing prior to surgery.  She had no history of  heart failure or history of atrial fibrillation.  Postoperatively patient went into atrial fibrillation with RVR in the PACU.  She was given IV metoprolol .  Was unable to be started on Cardizem infusion due to soft blood pressures. She did not appear to be in any acute distress.  Telemetry showed atrial fibrillation 120-130 bpm.  Blood pressure was slightly soft after 5 mg of IV metoprolol  which continued to remain asymptomatic.  She was started on amiodarone  infusion.  Unfortunately cannot give anticoagulation due to recent surgery.Anticoagulation was to be reconsidered in the morning if okay with orthopedics.  She underwent successful DCCV on 10/03/2023 and remained in sinus rhythm since that time.  IV amiodarone  was transitioned to oral amiodarone  with the escalating doses.  She was transition from heparin  drip to apixaban  5 mg twice daily she did not meet reduced dosing criteria.  Echocardiogram revealed an LVEF of 50-55%, mild LVH, mild MR, mild to moderate TR, and aortic valve sclerosis without stenosis.  Her metoprolol  to tartrate was transitioned to Toprol -XL.  She was considered stable and discharged to SNF in 10/15/2023.   She returns to clinic today accompanied by her husband.  She states that she has been doing well since being discharged from the hospital and has been working and rehab.  According to her husband she should be returning home on Wednesday.  She denies any chest pain, shortness of breath or palpitations.  She continues to endorse some  left hip discomfort status post hip surgery but has been improving as she has becoming more active.  At the facility she has not been compliant with her medication without any missed doses of her apixaban .  She denies any bleeding or noted blood in her stool or urine.  Husband had several questions today since cardioversion she is maintaining sinus rhythm if she was able to come off the apixaban , which he was advised no.   ROS: 10 point review of system has  been reviewed and considered negative with exception was been listed in the HPI  Studies Reviewed: SABRA   EKG Interpretation Date/Time:  Friday October 30 2023 10:43:58 EST Ventricular Rate:  47 PR Interval:  154 QRS Duration:  78 QT Interval:  510 QTC Calculation: 451 R Axis:   -25  Text Interpretation: Sinus bradycardia Left ventricular hypertrophy with repolarization abnormality ( R in aVL ) When compared with ECG of 08-Oct-2023 12:59, PREVIOUS ECG IS PRESENT Confirmed by Gerard Frederick ([id number]) on 10/30/2023 10:46:06 AM    2D echo 10/05/2023 1. Left ventricular ejection fraction, by estimation, is 50 to 55%. The  left ventricle has low normal function. Left ventricular endocardial  border not optimally defined to evaluate regional wall motion. There is  mild left ventricular hypertrophy. Left  ventricular diastolic parameters are indeterminate.   2. Right ventricular systolic function is normal. The right ventricular  size is normal. There is normal pulmonary artery systolic pressure.   3. Left atrial size was severely dilated.   4. The mitral valve is normal in structure. Mild mitral valve  regurgitation. No evidence of mitral stenosis.   5. Tricuspid valve regurgitation is mild to moderate.   6. 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 Aortic valve regurgitation is not  visualized. Aortic valve sclerosis/calcification is present, without any  evidence of aortic stenosis.   7. The inferior vena cava is normal in size with greater than 50%  respiratory variability, suggesting right atrial pressure of 3 mmHg.  Risk Assessment/Calculations:    CHA2DS2-VASc Score = 4   This indicates a 4.8% annual risk of stroke. The patient's score is based upon: CHF History: 0 HTN History: 1 Diabetes History: 0 Stroke History: 0 Vascular Disease History: 0 Age Score: 2 Gender Score: 1            Physical Exam:   VS:  BP 130/82   Pulse (!) 47   Ht 5' 3 (1.6 m)   Wt 150 lb (68 kg)   LMP  (LMP  Unknown)   SpO2 99%   BMI 26.57 kg/m    Wt Readings from Last 3 Encounters:  10/30/23 150 lb (68 kg)  10/15/23 141 lb 1.5 oz (64 kg)  09/30/23 143 lb 9.6 oz (65.1 kg)    GEN: Well nourished, well developed in no acute distress NECK: No JVD; No carotid bruits CARDIAC: RRR, bradycardic, no murmurs, rubs, gallops RESPIRATORY:  Clear to auscultation without rales, wheezing or rhonchi  ABDOMEN: Soft, non-tender, non-distended EXTREMITIES:  1+ edema; No deformity   ASSESSMENT AND PLAN: .   Postop new onset atrial fibrillation who underwent successful cardioversion and was placed on amiodarone .  EKG today reveals sinus bradycardia with a rate of 47, LVH, left axis deviation.  She is continued on amiodarone  200 mg daily, apixaban  5 mg twice daily for CHA2DS2-VASc score of at least 4 she does not meet reduced dosing criteria.  She is also continued on Toprol -XL 50 mg daily.  She has been sent  for follow-up labs today of CBC to rule out anemia since being on blood thinners, BMP recheck electrolyte and kidney function, no TSH since being on amiodarone .  Baseline heart rate of 52 she continues to remain bradycardic but distant predominantly reducing her Toprol -XL dose.  Hypertension with a blood pressure today of 130/82.  She is continued on amlodipine  10 mg daily, and Toprol  50 mg daily.  She has been encouraged to continue to monitor pressure 1 to 2 hours postmedication administration at home as well.  Hyperlipidemia with last LDL 122.  Continued on atorvastatin  40 mg daily with an updated lipid and hepatic panel in 8 weeks.  Status post left hip repair which she has not been participating in rehab.  Anticipating home on Wednesday.  Continue with follow-up with orthopedics and continue with therapy as recommended.       Dispo: Patient to return to clinic to see MD/APP in 3 months or sooner if needed  Signed, Kendre Jacinto, NP

## 2023-10-30 NOTE — Patient Instructions (Signed)
 Medication Instructions:  No changes *If you need a refill on your cardiac medications before your next appointment, please call your pharmacy*   Lab Work: Your provider would like for you to have the following labs today: CBC, BMET, TSH  If you have labs (blood work) drawn today and your tests are completely normal, you will receive your results only by: MyChart Message (if you have MyChart) OR A paper copy in the mail If you have any lab test that is abnormal or we need to change your treatment, we will call you to review the results.   Testing/Procedures: None ordered   Follow-Up: At [facility], you and your health needs are our priority.  As part of our continuing mission to provide you with exceptional heart care, we have created designated Provider Care Teams.  These Care Teams include your primary Cardiologist (physician) and Advanced Practice Providers (APPs -  Physician Assistants and Nurse Practitioners) who all work together to provide you with the care you need, when you need it.  We recommend signing up for the patient portal called MyChart.  Sign up information is provided on this After Visit Summary.  MyChart is used to connect with patients for Virtual Visits (Telemedicine).  Patients are able to view lab/test results, encounter notes, upcoming appointments, etc.  Non-urgent messages can be sent to your provider as well.   To learn more about what you can do with MyChart, go to [email or web address].    Your next appointment:   3 month(s)  Provider:   Tylene Lunch, NP

## 2023-10-31 ENCOUNTER — Other Ambulatory Visit: Payer: Self-pay

## 2023-10-31 ENCOUNTER — Emergency Department (HOSPITAL_COMMUNITY): Payer: PPO

## 2023-10-31 ENCOUNTER — Encounter (HOSPITAL_COMMUNITY): Payer: Self-pay | Admitting: Emergency Medicine

## 2023-10-31 ENCOUNTER — Emergency Department (HOSPITAL_COMMUNITY)
Admission: EM | Admit: 2023-10-31 | Discharge: 2023-10-31 | Disposition: A | Discharge status: Home / Self Care | Payer: PPO | Attending: Emergency Medicine | Admitting: Emergency Medicine

## 2023-10-31 DIAGNOSIS — W010XXA Fall on same level from slipping, tripping and stumbling without subsequent striking against object, initial encounter: Secondary | ICD-10-CM | POA: Diagnosis not present

## 2023-10-31 DIAGNOSIS — S0990XA Unspecified injury of head, initial encounter: Secondary | ICD-10-CM

## 2023-10-31 DIAGNOSIS — S199XXA Unspecified injury of neck, initial encounter: Secondary | ICD-10-CM | POA: Diagnosis not present

## 2023-10-31 DIAGNOSIS — I10 Essential (primary) hypertension: Secondary | ICD-10-CM | POA: Diagnosis not present

## 2023-10-31 DIAGNOSIS — R001 Bradycardia, unspecified: Secondary | ICD-10-CM | POA: Diagnosis not present

## 2023-10-31 DIAGNOSIS — Z7901 Long term (current) use of anticoagulants: Secondary | ICD-10-CM | POA: Diagnosis not present

## 2023-10-31 DIAGNOSIS — S0003XA Contusion of scalp, initial encounter: Secondary | ICD-10-CM | POA: Diagnosis not present

## 2023-10-31 DIAGNOSIS — M542 Cervicalgia: Secondary | ICD-10-CM | POA: Diagnosis not present

## 2023-10-31 DIAGNOSIS — I6782 Cerebral ischemia: Secondary | ICD-10-CM | POA: Diagnosis not present

## 2023-10-31 DIAGNOSIS — S72142A Displaced intertrochanteric fracture of left femur, initial encounter for closed fracture: Secondary | ICD-10-CM | POA: Diagnosis not present

## 2023-10-31 DIAGNOSIS — G319 Degenerative disease of nervous system, unspecified: Secondary | ICD-10-CM | POA: Diagnosis not present

## 2023-10-31 DIAGNOSIS — W19XXXA Unspecified fall, initial encounter: Secondary | ICD-10-CM

## 2023-10-31 LAB — BASIC METABOLIC PANEL
BUN/Creatinine Ratio: 16 (ref 12–28)
BUN: 13 mg/dL (ref 8–27)
CO2: 22 mmol/L (ref 20–29)
Calcium: 9.8 mg/dL (ref 8.7–10.3)
Chloride: 105 mmol/L (ref 96–106)
Creatinine, Ser: 0.82 mg/dL (ref 0.57–1.00)
Glucose: 95 mg/dL (ref 70–99)
Potassium: 3.7 mmol/L (ref 3.5–5.2)
Sodium: 141 mmol/L (ref 134–144)
eGFR: 72 mL/min/{1.73_m2} (ref >59)

## 2023-10-31 LAB — CBC
Hematocrit: 40.5 % (ref 34.0–46.6)
Hemoglobin: 13.2 g/dL (ref 11.1–15.9)
MCH: 32.4 pg (ref 26.6–33.0)
MCHC: 32.6 g/dL (ref 31.5–35.7)
MCV: 99 fL — ABNORMAL HIGH (ref 79–97)
Platelets: 124 10*3/uL — ABNORMAL LOW (ref 150–450)
RBC: 4.08 x10E6/uL (ref 3.77–5.28)
RDW: 11.7 % (ref 11.7–15.4)
WBC: 4.9 10*3/uL (ref 3.4–10.8)

## 2023-10-31 LAB — TSH: TSH: 10.4 u[IU]/mL — ABNORMAL HIGH (ref 0.450–4.500)

## 2023-10-31 NOTE — Discharge Instructions (Signed)
 You were seen for your fall in the emergency department.   At home, please take Tylenol  for your pain.    Check your MyChart online for the results of any tests that had not resulted by the time you left the emergency department.   Follow-up with your primary doctor in 2-3 days regarding your visit.    Return immediately to the emergency department if you experience any of the following: Severe headache, or any other concerning symptoms.    Thank you for visiting our Emergency Department. It was a pleasure taking care of you today.

## 2023-10-31 NOTE — Progress Notes (Signed)
 Orthopedic Tech Progress Note Patient Details:  [name] 07/23/1941 [id number] Level 2 Trauma  Patient ID: [name], female   DOB: Sep 23, 1940, 83 y.o.   MRN: [id number]  [name] 10/31/2023, 5:29 PM

## 2023-10-31 NOTE — ED Notes (Signed)
 Pt arrived

## 2023-10-31 NOTE — ED Provider Notes (Signed)
 [place] EMERGENCY DEPARTMENT AT [facility] Provider Note   CSN: [id number] Arrival date & time: 10/31/23  1654     History  Chief Complaint  Patient presents with   Fall    On thinner    [name] is a 83 y.o. female.  83 year old female with a history of atrial fibrillation on Eliquis  who presents emergency department after a fall.  Patient reports that she was walking and tripped over her feet and fell.  Says that she struck the right side of her head.  Is having pain there but did not lose consciousness.  Also was having some neck pain.  Denies pain elsewhere after the fall.  No preceding symptoms.  Last took her Eliquis  at 9 AM this morning.       Home Medications Prior to Admission medications   Medication Sig Start Date End Date Taking? Authorizing Provider  acetaminophen  (TYLENOL ) 500 MG tablet Take 500 mg by mouth every 6 (six) hours as needed for moderate pain.     [provider]  amiodarone  (PACERONE ) 200 MG tablet Take 1 tablet (200 mg total) by mouth daily. 10/15/23 11/14/23  Trudy Anthony HERO, MD  amLODipine  (NORVASC ) 10 MG tablet TAKE 1 TABLET(10 MG) BY MOUTH DAILY 04/10/23   Antonette Angeline ORN, NP  apixaban  (ELIQUIS ) 5 MG TABS tablet Take 1 tablet (5 mg total) by mouth 2 (two) times daily. 10/15/23 11/14/23  Trudy Anthony HERO, MD  atorvastatin  (LIPITOR) 40 MG tablet TAKE 1 TABLET(40 MG) BY MOUTH DAILY 07/31/23   Antonette Angeline ORN, NP  citalopram  (CELEXA ) 20 MG tablet TAKE 1 TABLET(20 MG) BY MOUTH DAILY 04/10/23   Antonette Angeline ORN, NP  HYDROcodone -acetaminophen  (NORCO/VICODIN) 5-325 MG tablet Take 1 tablet by mouth every 6 (six) hours as needed for severe pain (pain score 7-10). 10/09/23   Kip Lynwood Double, PA-C  levothyroxine  (SYNTHROID ) 150 MCG tablet TAKE 1 TABLET(150 MCG) BY MOUTH DAILY 04/10/23   Antonette Angeline ORN, NP  metoprolol  succinate (TOPROL -XL) 50 MG 24 hr tablet TAKE 1 TABLET BY MOUTH EVERY DAY WITH OR IMMEDIATELY FOLLOWING A MEAL  04/10/23   Antonette Angeline ORN, NP  Multiple Vitamin (MULTIVITAMIN) capsule Take 1 capsule by mouth daily.    [provider]  traZODone  (DESYREL ) 50 MG tablet TAKE 1 TABLET(50 MG) BY MOUTH AT BEDTIME AS NEEDED FOR SLEEP 04/10/23   Antonette Angeline ORN, NP      Allergies    Codeine, Morphine , Ace inhibitors, and Adhesive [tape]    Review of Systems   Review of Systems  Physical Exam Updated Vital Signs BP 122/60   Pulse (!) 51   Temp 98.3 F (36.8 C) (Oral)   Resp 16   Ht 5' 3 (1.6 m)   Wt 68 kg   LMP  (LMP Unknown)   SpO2 97%   BMI 26.56 kg/m  Physical Exam Vitals and nursing note reviewed.  Constitutional:      General: She is not in acute distress.    Appearance: Normal appearance. She is well-developed. She is not ill-appearing.  HENT:     Head: Normocephalic.     Comments: Small hematoma to right occiput    Right Ear: External ear normal.     Left Ear: External ear normal.     Nose: Nose normal.     Mouth/Throat:     Mouth: Mucous membranes are moist.     Pharynx: Oropharynx is clear.  Eyes:     Extraocular Movements: Extraocular  movements intact.     Conjunctiva/sclera: Conjunctivae normal.     Pupils: Pupils are equal, round, and reactive to light.  Neck:     Comments: No C-spine midline tenderness to palpation Cardiovascular:     Rate and Rhythm: Regular rhythm. Bradycardia present.     Pulses: Normal pulses.     Heart sounds: Normal heart sounds. No murmur heard. Pulmonary:     Effort: Pulmonary effort is normal. No respiratory distress.     Breath sounds: Normal breath sounds.  Abdominal:     General: Abdomen is flat. There is no distension.     Palpations: Abdomen is soft. There is no mass.     Tenderness: There is no abdominal tenderness. There is no guarding.  Musculoskeletal:        General: No deformity. Normal range of motion.     Cervical back: Normal range of motion and neck supple. No rigidity or tenderness.     Right lower leg: No edema.      Left lower leg: No edema.     Comments: No tenderness to palpation of midline thoracic or lumbar spine.  No step-offs palpated.  No tenderness to palpation of chest wall.  No bruising noted.  No tenderness to palpation of bilateral clavicles.  No tenderness to palpation, bruising, or deformities noted of bilateral shoulders, elbows, wrists, hips, knees, or ankles.  Skin:    General: Skin is warm and dry.  Neurological:     General: No focal deficit present.     Mental Status: She is alert. Mental status is at baseline.     Cranial Nerves: No cranial nerve deficit.     Motor: No weakness.  Psychiatric:        Mood and Affect: Mood normal.     ED Results / Procedures / Treatments   Labs (all labs ordered are listed, but only abnormal results are displayed) Labs Reviewed - No data to display  EKG EKG Interpretation Date/Time:  Saturday October 31 2023 18:03:32 EST Ventricular Rate:  49 PR Interval:  166 QRS Duration:  110 QT Interval:  531 QTC Calculation: 480 R Axis:   -18  Text Interpretation: Sinus bradycardia LVH with secondary repolarization abnormality Confirmed by Yolande Charleston [phone]) on 10/31/2023 6:16:13 PM  Radiology CT Head Wo Contrast Result Date: 10/31/2023 CLINICAL DATA:  Fall and trauma to the head and neck. EXAM: CT HEAD WITHOUT CONTRAST CT CERVICAL SPINE WITHOUT CONTRAST TECHNIQUE: Multidetector CT imaging of the head and cervical spine was performed following the standard protocol without intravenous contrast. Multiplanar CT image reconstructions of the cervical spine were also generated. RADIATION DOSE REDUCTION: This exam was performed according to the departmental dose-optimization program which includes automated exposure control, adjustment of the mA and/or kV according to patient size and/or use of iterative reconstruction technique. COMPARISON:  Head CT dated 10/08/2023. FINDINGS: CT HEAD FINDINGS Brain: Mild age-related atrophy and moderate chronic  microvascular ischemic changes. There is no acute intracranial hemorrhage. No mass effect or midline shift. No extra-axial fluid collection. Vascular: No hyperdense vessel or unexpected calcification. Skull: Normal. Negative for fracture or focal lesion. Sinuses/Orbits: No acute finding. Other: None CT CERVICAL SPINE FINDINGS Alignment: No acute subluxation. Skull base and vertebrae: No acute fracture. Soft tissues and spinal canal: No prevertebral fluid or swelling. No visible canal hematoma. Disc levels:  No acute findings.  Degenerative changes. Upper chest: Emphysema. Other: Bilateral carotid bulb calcified plaques. IMPRESSION: 1. No acute intracranial pathology. Mild age-related atrophy and moderate  chronic microvascular ischemic changes. 2. No acute/traumatic cervical spine pathology. Electronically Signed   By: Vanetta Chou M.D.   On: 10/31/2023 18:10   CT Cervical Spine Wo Contrast Result Date: 10/31/2023 CLINICAL DATA:  Fall and trauma to the head and neck. EXAM: CT HEAD WITHOUT CONTRAST CT CERVICAL SPINE WITHOUT CONTRAST TECHNIQUE: Multidetector CT imaging of the head and cervical spine was performed following the standard protocol without intravenous contrast. Multiplanar CT image reconstructions of the cervical spine were also generated. RADIATION DOSE REDUCTION: This exam was performed according to the departmental dose-optimization program which includes automated exposure control, adjustment of the mA and/or kV according to patient size and/or use of iterative reconstruction technique. COMPARISON:  Head CT dated 10/08/2023. FINDINGS: CT HEAD FINDINGS Brain: Mild age-related atrophy and moderate chronic microvascular ischemic changes. There is no acute intracranial hemorrhage. No mass effect or midline shift. No extra-axial fluid collection. Vascular: No hyperdense vessel or unexpected calcification. Skull: Normal. Negative for fracture or focal lesion. Sinuses/Orbits: No acute finding. Other:  None CT CERVICAL SPINE FINDINGS Alignment: No acute subluxation. Skull base and vertebrae: No acute fracture. Soft tissues and spinal canal: No prevertebral fluid or swelling. No visible canal hematoma. Disc levels:  No acute findings.  Degenerative changes. Upper chest: Emphysema. Other: Bilateral carotid bulb calcified plaques. IMPRESSION: 1. No acute intracranial pathology. Mild age-related atrophy and moderate chronic microvascular ischemic changes. 2. No acute/traumatic cervical spine pathology. Electronically Signed   By: Vanetta Chou M.D.   On: 10/31/2023 18:10    Procedures Procedures    Medications Ordered in ED Medications - No data to display  ED Course/ Medical Decision Making/ A&P                                 Medical Decision Making Amount and/or Complexity of Data Reviewed Radiology: ordered.   DEIRDRA HEUMANN is a 83 y.o. female with comorbidities that complicate the patient evaluation including  atrial fibrillation on Eliquis  who presents emergency department after a fall.   Initial Ddx:  TBI, c-spine injury, mechanical fall, syncope   MDM:  Concerned about possible TBI given the patient's fall and the fact that they are on blood thinners so will obtain a head CT at this time.  With their age we will also obtain a CT of the C-spine as well.  Appears that this was a result of mechanical fall and did not have any preceding symptoms that would suggest syncope.  No other signs of trauma at this time on physical exam.   Plan:  CT head CT C-spine  ED Summary/Re-evaluation:  No acute injuries were found on the patient's imaging.  Instructed to take Tylenol  for pain.  This patient presents to the ED for concern of complaints listed in HPI, this involves an extensive number of treatment options, and is a complaint that carries with it a high risk of complications and morbidity. Disposition including potential need for admission considered.   Dispo: DC Home. Return  precautions discussed including, but not limited to, those listed in the AVS. Allowed pt time to ask questions which were answered fully prior to dc.  Additional history obtained from spouse Records reviewed Outpatient Clinic Notes I independently reviewed the following imaging with scope of interpretation limited to determining acute life threatening conditions related to emergency care: CT Head and agree with the radiologist interpretation with the following exceptions: none I personally reviewed and interpreted  cardiac monitoring: sinus bradycardia I personally reviewed and interpreted the pt's EKG: see above for interpretation  I have reviewed the patients home medications and made adjustments as needed Social Determinants of health:  Elderly   Final Clinical Impression(s) / ED Diagnoses Final diagnoses:  Fall, initial encounter  Minor head injury, initial encounter    Rx / DC Orders ED Discharge Orders     None         Yolande Lamar BROCKS, MD 11/01/23 1312

## 2023-10-31 NOTE — Progress Notes (Signed)
   10/31/23 1700  Spiritual Encounters  Type of Visit Initial  Care provided to: Patient  Conversation partners present during encounter Other (comment) (EMS)  Referral source Trauma page  Reason for visit Trauma  OnCall Visit Yes   Chaplain responded to a trauma in the ED - level II, fall on blood thinners. Per EMS, the nursing home notified patient's family that she was here. Per patient, no needs at this time. Chaplain introduced spiritual care services. Spiritual care services available as needed.   Juliene CHRISTELLA Das, Chaplain 10/31/23

## 2023-10-31 NOTE — ED Notes (Signed)
Arrived to CT

## 2023-10-31 NOTE — ED Triage Notes (Signed)
 Pt presents via EMS as fall on thinner (Eliquis )  Arrives from [facility], mechanical fall causing her to hit her head on  the floor, found laying on R side, no pain aside from tenderness to head and HA.   EMS VS: hematoma to posterior scalp, 152/68, 50bpm, RR18, 98%RA, 113CBG

## 2023-11-02 ENCOUNTER — Telehealth: Payer: Self-pay

## 2023-11-02 DIAGNOSIS — W19XXXA Unspecified fall, initial encounter: Secondary | ICD-10-CM | POA: Diagnosis not present

## 2023-11-02 DIAGNOSIS — I10 Essential (primary) hypertension: Secondary | ICD-10-CM | POA: Diagnosis not present

## 2023-11-02 DIAGNOSIS — S72142A Displaced intertrochanteric fracture of left femur, initial encounter for closed fracture: Secondary | ICD-10-CM | POA: Diagnosis not present

## 2023-11-02 DIAGNOSIS — Z7901 Long term (current) use of anticoagulants: Secondary | ICD-10-CM | POA: Diagnosis not present

## 2023-11-02 DIAGNOSIS — I4891 Unspecified atrial fibrillation: Secondary | ICD-10-CM | POA: Diagnosis not present

## 2023-11-02 DIAGNOSIS — M25572 Pain in left ankle and joints of left foot: Secondary | ICD-10-CM | POA: Diagnosis not present

## 2023-11-02 DIAGNOSIS — E039 Hypothyroidism, unspecified: Secondary | ICD-10-CM | POA: Diagnosis not present

## 2023-11-02 NOTE — Transitions of Care (Post Inpatient/ED Visit) (Signed)
 11/02/2023  Name: [name] MRN: [id number] DOB: 1941-09-08  Today's TOC FU Call Status: Today's TOC FU Call Status:: Successful TOC FU Call Completed TOC FU Call Complete Date: 11/02/23 Patient's Name and Date of Birth confirmed.  Transition Care Management Follow-up Telephone Call Date of Discharge: 10/31/23 Discharge Facility: [facility]) Type of Discharge: Emergency Department Reason for ED Visit: Other: (fall) How have you been since you were released from the hospital?: Better Any questions or concerns?: No  Items Reviewed: Did you receive and understand the discharge instructions provided?: Yes Medications obtained,verified, and reconciled?: Yes (Medications Reviewed) Any new allergies since your discharge?: No Dietary orders reviewed?: NA Do you have support at home?: Yes People in Home: spouse  Medications Reviewed Today: Medications Reviewed Today     Reviewed by Chauncy Coral, CMA (Certified Medical Assistant) on 11/02/23 at 1527  Med List Status: <None>   Medication Order Taking? Sig Documenting Provider Last Dose Status Informant  acetaminophen  (TYLENOL ) 500 MG tablet [id number] No Take 500 mg by mouth every 6 (six) hours as needed for moderate pain.  [provider] Taking Active Self, Pharmacy Records  amiodarone  (PACERONE ) 200 MG tablet [id number] No Take 1 tablet (200 mg total) by mouth daily. Alphonsus Jeans, MD Taking Active   amLODipine  (NORVASC ) 10 MG tablet [id number] No TAKE 1 TABLET(10 MG) BY MOUTH DAILY Baity, Rankin Buzzard, NP Taking Active Self, Pharmacy Records  apixaban  (ELIQUIS ) 5 MG TABS tablet [id number] No Take 1 tablet (5 mg total) by mouth 2 (two) times daily. Alphonsus Jeans, MD Taking Active   atorvastatin  (LIPITOR) 40 MG tablet [id number] No TAKE 1 TABLET(40 MG) BY MOUTH DAILY Thalia Filler Rankin Buzzard, NP Taking Active Self, Pharmacy Records  citalopram  (CELEXA ) 20 MG tablet [id number] No TAKE 1 TABLET(20 MG) BY MOUTH DAILY  Baity, Rankin Buzzard, NP Taking Active Self, Pharmacy Records  HYDROcodone -acetaminophen  (NORCO/VICODIN) 5-325 MG tablet [id number] No Take 1 tablet by mouth every 6 (six) hours as needed for severe pain (pain score 7-10). Rojelio Clement, PA-C Taking Active   levothyroxine  (SYNTHROID ) 150 MCG tablet [id number] No TAKE 1 TABLET(150 MCG) BY MOUTH DAILY Thalia Filler Rankin Buzzard, NP Taking Active Self, Pharmacy Records           Med Note (TATE, Andre Band Aug 18, 2023  1:35 PM) Reviewed with the patient today to take her medications Monday - Saturday and skip Sunday. The patient has still be taking 7 days a week. Read the notes from the endocrinologist to the patient and used teach back method to make sure the patient understood correctly.   metoprolol  succinate (TOPROL -XL) 50 MG 24 hr tablet [id number] No TAKE 1 TABLET BY MOUTH EVERY DAY WITH OR IMMEDIATELY FOLLOWING A MEAL Baity, Rankin Buzzard, NP Taking Active Self, Pharmacy Records  Multiple Vitamin (MULTIVITAMIN) capsule [id number] No Take 1 capsule by mouth daily. [provider] Taking Active Self, Pharmacy Records  traZODone  (DESYREL ) 50 MG tablet [id number] No TAKE 1 TABLET(50 MG) BY MOUTH AT BEDTIME AS NEEDED FOR SLEEP Baity, Rankin Buzzard, NP Taking Active Self, Pharmacy Records            Home Care and Equipment/Supplies: Were Home Health Services Ordered?: NA Any new equipment or medical supplies ordered?: NA  Functional Questionnaire: Do you need assistance with bathing/showering or dressing?: No Do you need assistance with meal preparation?: No Do you need assistance with eating?: No Do you have difficulty maintaining continence: No Do you need assistance  with getting out of bed/getting out of a chair/moving?: No Do you have difficulty managing or taking your medications?: No  Follow up appointments reviewed: PCP Follow-up appointment confirmed?: No (pt declined) Specialist Hospital Follow-up appointment confirmed?: NA Do you need  transportation to your follow-up appointment?: No Do you understand care options if your condition(s) worsen?: Yes-patient verbalized understanding    SIGNATURE Germain Kohler, CMA (AAMA)  [facility] [phone]

## 2023-11-02 NOTE — Progress Notes (Signed)
 Kidney function and electrolytes have remained stable.  Blood counts are stable.  No elevated white count concerning for infection.  Thyroid /TSH is elevated.  Recommend continuing current medication regimen without changes and repeating TSH and free T4 in 6 weeks.

## 2023-11-03 ENCOUNTER — Telehealth: Payer: Self-pay

## 2023-11-03 ENCOUNTER — Other Ambulatory Visit: Payer: Self-pay | Admitting: *Deleted

## 2023-11-03 DIAGNOSIS — I10 Essential (primary) hypertension: Secondary | ICD-10-CM

## 2023-11-03 DIAGNOSIS — I480 Paroxysmal atrial fibrillation: Secondary | ICD-10-CM

## 2023-11-03 NOTE — Telephone Encounter (Signed)
Noted, will discuss at upcoming appointment.

## 2023-11-03 NOTE — Transitions of Care (Post Inpatient/ED Visit) (Signed)
11/03/2023  Name: [name] MRN: [id number] DOB: [birth year]  Today's TOC FU Call Status: Today's TOC FU Call Status:: Successful TOC FU Call Completed TOC FU Call Complete Date: 11/03/23 Patient's Name and Date of Birth confirmed.  Transition Care Management Follow-up Telephone Call Date of Discharge: 11/02/23 Discharge Facility: Other ([facility]) Name of Other ([facility]) Discharge Facility: [facility] Type of Discharge: Inpatient Admission Primary Inpatient Discharge Diagnosis:: cognitive deficit How have you been since you were released from the hospital?: Better Any questions or concerns?: No  Items Reviewed: Did you receive and understand the discharge instructions provided?: Yes Medications obtained,verified, and reconciled?: Yes (Medications Reviewed) Any new allergies since your discharge?: No Dietary orders reviewed?: Yes Do you have support at home?: Yes People in Home: spouse  Medications Reviewed Today: Medications Reviewed Today     Reviewed by Karena Addison, LPN (Licensed Practical Nurse) on 11/03/23 at 1021  Med List Status: <None>   Medication Order Taking? Sig Documenting Provider Last Dose Status Informant  acetaminophen (TYLENOL) 500 MG tablet [id number] No Take 500 mg by mouth every 6 (six) hours as needed for moderate pain.  [provider] Taking Active Self, Pharmacy Records  amiodarone (PACERONE) 200 MG tablet [id number] No Take 1 tablet (200 mg total) by mouth daily. Charise Killian, MD Taking Active   amLODipine (NORVASC) 10 MG tablet [id number] No TAKE 1 TABLET(10 MG) BY MOUTH DAILY Baity, Salvadore Oxford, NP Taking Active Self, Pharmacy Records  apixaban (ELIQUIS) 5 MG TABS tablet [id number] No Take 1 tablet (5 mg total) by mouth 2 (two) times daily. Charise Killian, MD Taking Active   atorvastatin (LIPITOR) 40 MG tablet [id number] No TAKE 1 TABLET(40 MG) BY MOUTH DAILY Baity, Salvadore Oxford, NP Taking Active Self, Pharmacy Records   citalopram (CELEXA) 20 MG tablet [id number] No TAKE 1 TABLET(20 MG) BY MOUTH DAILY Baity, Salvadore Oxford, NP Taking Active Self, Pharmacy Records  HYDROcodone-acetaminophen (NORCO/VICODIN) 5-325 MG tablet [id number] No Take 1 tablet by mouth every 6 (six) hours as needed for severe pain (pain score 7-10). Anson [place], PA-C Taking Active   levothyroxine (SYNTHROID) 150 MCG tablet [id number] No TAKE 1 TABLET(150 MCG) BY MOUTH DAILY [name], NP Taking Active Self, Pharmacy Records           Med Note (TATE, Etheleen Mayhew Aug 18, 2023  1:35 PM) Reviewed with the patient today to take her medications Monday - Saturday and skip Sunday. The patient has still be taking 7 days a week. Read the notes from the endocrinologist to the patient and used teach back method to make sure the patient understood correctly.   metoprolol succinate (TOPROL-XL) 50 MG 24 hr tablet [id number] No TAKE 1 TABLET BY MOUTH EVERY DAY WITH OR IMMEDIATELY FOLLOWING A MEAL Baity, Salvadore Oxford, NP Taking Active Self, Pharmacy Records  Multiple Vitamin (MULTIVITAMIN) capsule [id number] No Take 1 capsule by mouth daily. [provider] Taking Active Self, Pharmacy Records  traZODone (DESYREL) 50 MG tablet [id number] No TAKE 1 TABLET(50 MG) BY MOUTH AT BEDTIME AS NEEDED FOR SLEEP Baity, Salvadore Oxford, NP Taking Active Self, Pharmacy Records            Home Care and Equipment/Supplies: Were Home Health Services Ordered?: Yes Has Agency set up a time to come to your home?: No Any new equipment or medical supplies ordered?: NA  Functional Questionnaire: Do you need assistance with bathing/showering or dressing?: No Do you need assistance with meal preparation?:  No Do you need assistance with eating?: No Do you have difficulty maintaining continence: No Do you need assistance with getting out of bed/getting out of a chair/moving?: No Do you have difficulty managing or taking your medications?: Yes  Follow up appointments  reviewed: PCP Follow-up appointment confirmed?: Yes Date of PCP follow-up appointment?: 11/13/23 Follow-up Provider: [facility] Follow-up appointment confirmed?: NA Do you need transportation to your follow-up appointment?: No Do you understand care options if your condition(s) worsen?: Yes-patient verbalized understanding    SIGNATURE Karena Addison, LPN [facility] Nurse Health Advisor Direct Dial [phone]

## 2023-11-04 ENCOUNTER — Telehealth: Payer: Self-pay

## 2023-11-04 NOTE — Telephone Encounter (Signed)
Copied from CRM [id number]. Topic: Quick Communication - Home Health Verbal Orders >> Nov 04, 2023  2:46 PM Everette C wrote: Caller/Agency: Lyn[facility] / [facility] Number: [phone] Service Requested: Skilled Nursing Frequency: requesting to start care on 11/09/23 Any new concerns about the patient? Yes

## 2023-11-05 NOTE — Telephone Encounter (Signed)
Advised cathy per Rene Kocher, okay for verbals.

## 2023-11-05 NOTE — Telephone Encounter (Signed)
OK for verbal orders as requested.

## 2023-11-09 DIAGNOSIS — Z604 Social exclusion and rejection: Secondary | ICD-10-CM | POA: Diagnosis not present

## 2023-11-09 DIAGNOSIS — R531 Weakness: Secondary | ICD-10-CM | POA: Diagnosis not present

## 2023-11-09 DIAGNOSIS — E785 Hyperlipidemia, unspecified: Secondary | ICD-10-CM | POA: Diagnosis not present

## 2023-11-09 DIAGNOSIS — M25572 Pain in left ankle and joints of left foot: Secondary | ICD-10-CM | POA: Diagnosis not present

## 2023-11-09 DIAGNOSIS — F5104 Psychophysiologic insomnia: Secondary | ICD-10-CM | POA: Diagnosis not present

## 2023-11-09 DIAGNOSIS — Z9181 History of falling: Secondary | ICD-10-CM | POA: Diagnosis not present

## 2023-11-09 DIAGNOSIS — Z7901 Long term (current) use of anticoagulants: Secondary | ICD-10-CM | POA: Diagnosis not present

## 2023-11-09 DIAGNOSIS — R41841 Cognitive communication deficit: Secondary | ICD-10-CM | POA: Diagnosis not present

## 2023-11-09 DIAGNOSIS — Z85828 Personal history of other malignant neoplasm of skin: Secondary | ICD-10-CM | POA: Diagnosis not present

## 2023-11-09 DIAGNOSIS — I10 Essential (primary) hypertension: Secondary | ICD-10-CM | POA: Diagnosis not present

## 2023-11-09 DIAGNOSIS — S72142D Displaced intertrochanteric fracture of left femur, subsequent encounter for closed fracture with routine healing: Secondary | ICD-10-CM | POA: Diagnosis not present

## 2023-11-09 DIAGNOSIS — E039 Hypothyroidism, unspecified: Secondary | ICD-10-CM | POA: Diagnosis not present

## 2023-11-09 DIAGNOSIS — G4730 Sleep apnea, unspecified: Secondary | ICD-10-CM | POA: Diagnosis not present

## 2023-11-09 DIAGNOSIS — D649 Anemia, unspecified: Secondary | ICD-10-CM | POA: Diagnosis not present

## 2023-11-09 DIAGNOSIS — F32A Depression, unspecified: Secondary | ICD-10-CM | POA: Diagnosis not present

## 2023-11-09 DIAGNOSIS — F17210 Nicotine dependence, cigarettes, uncomplicated: Secondary | ICD-10-CM | POA: Diagnosis not present

## 2023-11-09 DIAGNOSIS — Z556 Problems related to health literacy: Secondary | ICD-10-CM | POA: Diagnosis not present

## 2023-11-09 DIAGNOSIS — F419 Anxiety disorder, unspecified: Secondary | ICD-10-CM | POA: Diagnosis not present

## 2023-11-09 DIAGNOSIS — E876 Hypokalemia: Secondary | ICD-10-CM | POA: Diagnosis not present

## 2023-11-09 DIAGNOSIS — D696 Thrombocytopenia, unspecified: Secondary | ICD-10-CM | POA: Diagnosis not present

## 2023-11-09 DIAGNOSIS — M1990 Unspecified osteoarthritis, unspecified site: Secondary | ICD-10-CM | POA: Diagnosis not present

## 2023-11-09 DIAGNOSIS — I4891 Unspecified atrial fibrillation: Secondary | ICD-10-CM | POA: Diagnosis not present

## 2023-11-10 ENCOUNTER — Telehealth: Payer: Self-pay | Admitting: Internal Medicine

## 2023-11-10 NOTE — Telephone Encounter (Signed)
 Home Health Verbal Orders - Caller/Agency: Thayer Ohm with [facility] Number: [phone] Service Requested: Physical Therapy Frequency: 1 x 9  Any new concerns about the patient? Yes - resting heart rate 47

## 2023-11-11 NOTE — Telephone Encounter (Signed)
 Verbal orders given to chris

## 2023-11-11 NOTE — Telephone Encounter (Signed)
Okay for verbal orders as requested? 

## 2023-11-13 ENCOUNTER — Encounter: Payer: Self-pay | Admitting: Internal Medicine

## 2023-11-13 ENCOUNTER — Ambulatory Visit: Payer: PPO | Admitting: Internal Medicine

## 2023-11-13 VITALS — BP 122/62 | Ht 63.0 in | Wt 149.0 lb

## 2023-11-13 DIAGNOSIS — I4891 Unspecified atrial fibrillation: Secondary | ICD-10-CM

## 2023-11-13 DIAGNOSIS — Z8781 Personal history of (healed) traumatic fracture: Secondary | ICD-10-CM

## 2023-11-13 DIAGNOSIS — S72002D Fracture of unspecified part of neck of left femur, subsequent encounter for closed fracture with routine healing: Secondary | ICD-10-CM

## 2023-11-13 DIAGNOSIS — R410 Disorientation, unspecified: Secondary | ICD-10-CM | POA: Diagnosis not present

## 2023-11-13 DIAGNOSIS — Z9889 Other specified postprocedural states: Secondary | ICD-10-CM

## 2023-11-13 NOTE — Patient Instructions (Signed)
Hip Exercises Ask your health care provider which exercises are safe for you. Do exercises exactly as told by your provider and adjust them as told. It is normal to feel mild stretching, pulling, tightness, or discomfort as you do these exercises. Stop right away if you feel sudden pain or your pain gets worse. Do not begin these exercises until told by your provider. Stretching and range-of-motion exercises These exercises warm up your muscles and joints and improve the movement and flexibility of your hip. They also help to relieve pain, numbness, and tingling. You may be asked to limit your range of motion if you had a hip replacement. Talk to your provider about these limits. Hamstrings, supine  Lie on your back (supine position). Loop a belt, towel, or exercise band over the ball of your left / right foot. The ball of your foot is on the walking surface, right under your toes. Straighten your left / right knee and slowly pull on the belt, towel, or band to raise your leg until you feel a gentle stretch behind your knee (hamstring). Do not let your knee bend while you do this. Keep your other leg flat on the floor. Hold this position for __________ seconds. Slowly return your leg to the starting position. Repeat __________ times. Complete this exercise __________ times a day. Hip rotation  Lie on your back on a firm surface. With your left / right hand, gently pull your left / right knee toward the shoulder that is on the same side of the body. Stop when your knee is pointing toward the ceiling. Hold your left / right ankle with your other hand. Keeping your knee steady, gently pull your left / right ankle toward your other shoulder until you feel a stretch in your butt. Keep your hips and shoulders firmly planted while you do this stretch. Hold this position for __________ seconds. Repeat __________ times. Complete this exercise __________ times a day. Seated stretch This exercise is  sometimes called hamstrings and adductors stretch. Sit on the floor with your legs stretched wide. Keep your knees straight during this exercise. Keeping your head and back in a straight line, bend at your waist to reach for your left foot (position A). You should feel a stretch in your right inner thigh (adductors). Hold this position for __________ seconds. Then slowly return to the upright position. Keeping your head and back in a straight line, bend at your waist to reach forward (position B). You should feel a stretch behind both of your thighs and knees (hamstrings). Hold this position for __________ seconds. Then slowly return to the upright position. Keeping your head and back in a straight line, bend at your waist to reach for your right foot (position C). You should feel a stretch in your left inner thigh (adductors). Hold this position for __________ seconds. Then slowly return to the upright position. Repeat __________ times. Complete this exercise __________ times a day. Lunge This exercise stretches the muscles of the hip (hip flexors). Place your left / right knee on the floor and bend your other knee so that is directly over your ankle. You should be half-kneeling. Keep good posture with your head over your shoulders. Tighten your butt muscles to point your tailbone downward. This will prevent your back from arching too much. You should feel a gentle stretch in the front of your left / right thigh and hip. If you do not feel a stretch, slide your other foot forward slightly and  then slowly lunge forward with your chest up until your knee once again lines up over your ankle. Make sure your tailbone continues to point downward. Hold this position for __________ seconds. Slowly return to the starting position. Repeat __________ times. Complete this exercise __________ times a day. Strengthening exercises These exercises build strength and endurance in your hip. Endurance is the  ability to use your muscles for a long time, even after they get tired. Bridge This exercise strengthens the muscles of your hip (hip extensors). Lie on your back on a firm surface with your knees bent and your feet flat on the floor. Tighten your butt muscles and lift your bottom off the floor until the trunk of your body and your hips are level with your thighs. Do not arch your back. You should feel the muscles working in your butt and the back of your thighs. If you do not feel these muscles, slide your feet 1-2 inches (2.5-5 cm) farther away from your butt. Hold this position for __________ seconds. Slowly lower your hips to the starting position. Let your muscles relax completely between repetitions. Repeat __________ times. Complete this exercise __________ times a day. Straight leg raises, side-lying This exercise strengthens the muscles that move the hip joint away from the center of the body (hip abductors). Lie on your side with your left / right leg in the top position. Lie so your head, shoulder, hip, and knee line up. You may bend your bottom knee slightly to help you balance. Roll your hips slightly forward, so your hips are stacked directly over each other and your left / right knee is facing forward. Leading with your heel, lift your top leg 4-6 inches (10-15 cm). You should feel the muscles in your top hip lifting. Do not let your foot drift forward. Do not let your knee roll toward the ceiling. Hold this position for __________ seconds. Slowly return to the starting position. Let your muscles relax completely between repetitions. Repeat __________ times. Complete this exercise __________ times a day. Straight leg raises, side-lying This exercise strengthens the muscles that move the hip joint toward the center of the body (hip adductors). Lie on your side with your left / right leg in the bottom position. Lie so your head, shoulder, hip, and knee line up. You may place your  upper foot in front to help you balance. Roll your hips slightly forward, so your hips are stacked directly over each other and your left / right knee is facing forward. Tense the muscles in your inner thigh and lift your bottom leg 4-6 inches (10-15 cm). Hold this position for __________ seconds. Slowly return to the starting position. Let your muscles relax completely between repetitions. Repeat __________ times. Complete this exercise __________ times a day. Straight leg raises, supine This exercise strengthens the muscles in the front of your thigh (quadriceps and hip flexors). Lie on your back (supine position) with your left / right leg extended and your other knee bent. Tense the muscles in the front of your left / right thigh. You should see your kneecap slide up or see increased dimpling just above your knee. Keep these muscles tight as you raise your leg 4-6 inches (10-15 cm) off the floor. Do not let your knee bend. Hold this position for __________ seconds. Keep these muscles tense as you lower your leg. Relax the muscles slowly and completely between repetitions. Repeat __________ times. Complete this exercise __________ times a day. Hip abductors, standing This  exercise strengthens the muscles that move the leg and hip joint away from the center of the body (hip abductors). Tie one end of a rubber exercise band or tubing to a secure surface, such as a chair, table, or pole. Loop the other end of the band or tubing around your left / right ankle. Keeping your ankle with the band or tubing directly opposite the secured end, step away until there is tension in the tubing or band. Hold on to a chair, table, or pole as needed for balance. Lift your left / right leg out to your side. While you do this: Keep your back upright. Keep your shoulders over your hips. Keep your toes pointing forward. Make sure to use your hip muscles to slowly lift your leg. Do not tip your body or  forcefully lift your leg. Hold this position for __________ seconds. Slowly return to the starting position. Repeat __________ times. Complete this exercise __________ times a day. Squats This exercise strengthens the muscles in the front of your thigh (quadriceps). Stand in front of a table, or stand in a doorframe so your feet and knees are in line with the frame. You may place your hands on the table or frame for balance. Slowly bend your knees and lower your hips like you are going to sit in a chair. Keep your lower legs in a straight up-and-down position. Do not let your hips go lower than your knees. Do not bend your knees lower than told by your provider. If your hip pain increases, do not bend as low. Hold this position for ___________ seconds. Slowly push with your legs to return to standing. Do not use your hands to pull yourself to standing. Repeat __________ times. Complete this exercise __________ times a day. This information is not intended to replace advice given to you by your health care provider. Make sure you discuss any questions you have with your health care provider. Document Revised: 05/13/2022 Document Reviewed: 05/13/2022 Elsevier Patient Education  2024 [employer].

## 2023-11-13 NOTE — Progress Notes (Signed)
 Subjective:    Patient ID: [name], female    DOB: 1940-11-02, 83 y.o.   MRN: [id number]  HPI   Patient presents to clinic today for TCM hospital follow-up.  She presented to the ER 1/11 after a fall in a [employer] parking lot.  She initially drive home but then called EMS to come get her after she could not get out of the car.  She was complaining of left hip pain and left ankle pain.  Her left ankle x-ray was negative for fracture.  The CT of her left hip showed a closed left femoral fracture.  She subsequently underwent reduction and internal fixation while inpatient.  Her hospital course was complicated by delirium.  CT head was negative.  She also developed A-fib with RVR.  She was started on amiodarone, metoprolol and Eliquis.  She underwent cardioversion.  She was discharged 1/23 to skilled nursing facility.  She subsequently was discharged from the skilled nursing facility after about 2 weeks.  She presented back to the ER 2/8 with a fall with head injury.  She denied loss of consciousness.  She was also complaining of neck pain.  CT of the head and neck did not show any acute findings.  She was advised to take Tylenol.  Since that time, she reports she is not having any pain in her hip, but she is having pain in her buttocks, especially if she sits for a long time. She has not felt any palpitations, had chest pains or shortness of breath. She has a followup with orthopedic surgery on 2/28. She has not started PT yet, plans to start next week. She is taking tylenol andh hydrocodone for pain. She reports she has not fallen since she left rehab.   Review of Systems     Past Medical History:  Diagnosis Date   Allergy    Anemia    Anxiety    Arthritis    Cancer (HCC)    skin cancer   Hyperlipidemia    Hypertension    Hypothyroidism    Sciatica    Sleep apnea    does not use cpap   Thrombocytopenia (HCC)    Thyroid disease     Current Outpatient Medications  Medication  Sig Dispense Refill   acetaminophen (TYLENOL) 500 MG tablet Take 500 mg by mouth every 6 (six) hours as needed for moderate pain.      amiodarone (PACERONE) 200 MG tablet Take 1 tablet (200 mg total) by mouth daily. 30 tablet 0   amLODipine (NORVASC) 10 MG tablet TAKE 1 TABLET(10 MG) BY MOUTH DAILY 90 tablet 1   apixaban (ELIQUIS) 5 MG TABS tablet Take 1 tablet (5 mg total) by mouth 2 (two) times daily. 60 tablet 0   atorvastatin (LIPITOR) 40 MG tablet TAKE 1 TABLET(40 MG) BY MOUTH DAILY 90 tablet 2   citalopram (CELEXA) 20 MG tablet TAKE 1 TABLET(20 MG) BY MOUTH DAILY 90 tablet 1   HYDROcodone-acetaminophen (NORCO/VICODIN) 5-325 MG tablet Take 1 tablet by mouth every 6 (six) hours as needed for severe pain (pain score 7-10). 30 tablet 0   levothyroxine (SYNTHROID) 150 MCG tablet TAKE 1 TABLET(150 MCG) BY MOUTH DAILY 90 tablet 1   metoprolol succinate (TOPROL-XL) 50 MG 24 hr tablet TAKE 1 TABLET BY MOUTH EVERY DAY WITH OR IMMEDIATELY FOLLOWING A MEAL 90 tablet 1   Multiple Vitamin (MULTIVITAMIN) capsule Take 1 capsule by mouth daily.     traZODone (DESYREL) 50 MG tablet  TAKE 1 TABLET(50 MG) BY MOUTH AT BEDTIME AS NEEDED FOR SLEEP 90 tablet 1   No current facility-administered medications for this visit.    Allergies  Allergen Reactions   Codeine Nausea Only    Tolerates oxycodone   Morphine Nausea And Vomiting   Ace Inhibitors Cough   Adhesive [Tape] Other (See Comments) and Rash    whelps    Family History  Problem Relation Age of Onset   Dementia Mother    Dementia Sister    Heart attack Brother    Diabetes Brother    Breast cancer Paternal Aunt    Heart attack Son    Hypercalcemia Neg Hx    Cancer Neg Hx     Social History   Socioeconomic History   Marital status: Married    Spouse name: ROBERT   Number of children: 4   Years of education: Not on file   Highest education level: GED or equivalent  Occupational History   Occupation: retired   Occupation: Retired   Tobacco Use   Smoking status: Every Day    Current packs/day: 0.50    Average packs/day: 0.5 packs/day for 40.0 years (20.0 ttl pk-yrs)    Types: Cigarettes   Smokeless tobacco: Never  Vaping Use   Vaping status: Never Used  Substance and Sexual Activity   Alcohol use: No   Drug use: No   Sexual activity: Not Currently  Other Topics Concern   Not on file  Social History Narrative   Not on file   Social Drivers of Health   Financial Resource Strain: Low Risk  (05/16/2023)   [facility] from [facility]   Overall Financial Resource Strain (CARDIA)    Difficulty of Paying Living Expenses: Not very hard  Food Insecurity: No Food Insecurity (10/04/2023)   Hunger Vital Sign    Worried About Running Out of Food in the Last Year: Never true    Ran Out of Food in the Last Year: Never true  Transportation Needs: No Transportation Needs (10/04/2023)   PRAPARE - Administrator, Civil Service (Medical): No    Lack of Transportation (Non-Medical): No  Physical Activity: Inactive (02/03/2023)   Exercise Vital Sign    Days of Exercise per Week: 0 days    Minutes of Exercise per Session: 30 min  Stress: No Stress Concern Present (08/18/2023)   [employer] of Occupational Health - Occupational Stress Questionnaire    Feeling of Stress : Only a little  Social Connections: Moderately Integrated (10/04/2023)   Social Connection and Isolation Panel [NHANES]    Frequency of Communication with Friends and Family: More than three times a week    Frequency of Social Gatherings with Friends and Family: Twice a week    Attends Religious Services: More than 4 times per year    Active Member of [employer] or Organizations: No    Attends Banker Meetings: Never    Marital Status: Married  Catering manager Violence: Not At Risk (10/04/2023)   Humiliation, Afraid, Rape, and Kick questionnaire    Fear of Current or Ex-Partner: No    Emotionally Abused: No     Physically Abused: No    Sexually Abused: No     Constitutional: Denies fever, malaise, fatigue, headache or abrupt weight changes.  Respiratory: Denies difficulty breathing, shortness of breath, cough or sputum production.   Cardiovascular: Patient reports swelling in legs.  Denies chest pain, chest tightness, palpitations or swelling in the hands.  Gastrointestinal: Denies abdominal pain, bloating, constipation, diarrhea or blood in the stool.  Musculoskeletal: Patient reports left hip pain, decrease in range of motion, difficulty with gait..  Denies muscle pain or joint swelling.  Skin: Denies redness, rashes, lesions or ulcercations.  Neurological: Patient reports insomnia, restless legs and difficulty with memory. Denies dizziness, difficulty with speech or problems with balance and coordination.    No other specific complaints in a complete review of systems (except as listed in HPI above).  Objective:   Physical Exam  BP 122/62 (BP Location: Right Arm, Patient Position: Sitting, Cuff Size: Normal)   Ht 5\' 3"  (1.6 m)   Wt 149 lb (67.6 kg)   LMP  (LMP Unknown)   BMI 26.39 kg/m     Wt Readings from Last 3 Encounters:  10/31/23 149 lb 14.6 oz (68 kg)  10/30/23 150 lb (68 kg)  10/15/23 141 lb 1.5 oz (64 kg)    General: Appears her stated age, overweight, in NAD. Skin: Warm, dry and intact.  Staples to the left hip have been removed. HEENT: Head: normal shape and size; Eyes: sclera white, no icterus, conjunctiva pink, PERRLA and EOMs intact;  Cardiovascular: Bradycardic with normal rhythm. S1,S2 noted.  No murmur, rubs or gallops noted. No JVD.  Trace nonpitting BLE edema.  Pulmonary/Chest: Normal effort and coarse breath sounds. No respiratory distress. No wheezes, rales or ronchi noted.  Musculoskeletal: Kyphotic.  She has decent flexion, extension.  Slightly decreased abduction and adduction.  I did not check internal and external rotation of left hip as she has not seen  orthopedics in follow-up yet.  Pain with palpation over the left trochanter.  Shuffling gait with use of a traditional walker. Neurological: Alert and oriented.   BMET    Component Value Date/Time   NA 141 10/30/2023 1111   NA 139 08/26/2012 2035   K 3.7 10/30/2023 1111   K 3.3 (L) 08/26/2012 2035   CL 105 10/30/2023 1111   CL 105 08/26/2012 2035   CO2 22 10/30/2023 1111   CO2 29 08/26/2012 2035   GLUCOSE 95 10/30/2023 1111   GLUCOSE 93 10/15/2023 0421   GLUCOSE 97 08/26/2012 2035   BUN 13 10/30/2023 1111   BUN 13 08/26/2012 2035   CREATININE 0.82 10/30/2023 1111   CREATININE 0.63 09/30/2023 0846   CALCIUM 9.8 10/30/2023 1111   CALCIUM 9.9 08/26/2012 2035   GFRNONAA >60 10/15/2023 0421   GFRNONAA 66 09/11/2020 1147   GFRAA 76 09/11/2020 1147    Lipid Panel     Component Value Date/Time   CHOL 202 (H) 09/30/2023 0846   CHOL 181 12/21/2017 1017   TRIG 136 09/30/2023 0846   HDL 55 09/30/2023 0846   HDL 50 12/21/2017 1017   CHOLHDL 3.7 09/30/2023 0846   LDLCALC 122 (H) 09/30/2023 0846    CBC    Component Value Date/Time   WBC 4.9 10/30/2023 1111   WBC 7.7 10/15/2023 0421   RBC 4.08 10/30/2023 1111   RBC 3.19 (L) 10/15/2023 0421   HGB 13.2 10/30/2023 1111   HCT 40.5 10/30/2023 1111   PLT 124 (L) 10/30/2023 1111   MCV 99 (H) 10/30/2023 1111   MCV 92 08/26/2012 2035   MCH 32.4 10/30/2023 1111   MCH 33.9 10/15/2023 0421   MCHC 32.6 10/30/2023 1111   MCHC 33.1 10/15/2023 0421   RDW 11.7 10/30/2023 1111   RDW 13.1 08/26/2012 2035   LYMPHSABS 1.8 10/14/2023 0438   LYMPHSABS 2.5 12/21/2017 1017  MONOABS 1.3 (H) 10/14/2023 0438   EOSABS 0.1 10/14/2023 0438   EOSABS 0.3 12/21/2017 1017   BASOSABS 0.1 10/14/2023 0438   BASOSABS 0.0 12/21/2017 1017    Hgb A1C Lab Results  Component Value Date   HGBA1C 5.3 09/30/2023            Assessment & Plan:   TCM hospital follow-up for left hip fracture status post reduction internal fixation, delirium,  A-fib:  Hospital notes, labs and imaging reviewed She plans to start PT/OT next week Advised her not to use hydrocodone if she does not need it, use Tylenol up to 1000 mg 3 times daily She will keep her scheduled follow-up with [facility] 2/28 at 4:00 She does not see cardiology until May/2025 Continue amiodarone, metoprolol and Eliquis  RTC in 5 months for your annual exam Nicki Reaper, NP

## 2023-11-18 ENCOUNTER — Telehealth: Payer: Self-pay

## 2023-11-18 NOTE — Telephone Encounter (Signed)
 Okay to move me OT eval

## 2023-11-18 NOTE — Telephone Encounter (Signed)
 Misty Stanley was advised per Rene Kocher.

## 2023-11-18 NOTE — Telephone Encounter (Signed)
 Copied from CRM [id number]. Topic: Clinical - Home Health Verbal Orders >> Nov 18, 2023  9:09 AM Franchot Heidelberg wrote: Caller/Agency: Tacey Ruiz  Callback Number: [phone] Service Requested: Occupational Therapy Frequency: Requesting to move OT to this medicare week instead of last week. Has not seen her for her eval yet. Any new concerns about the patient? No

## 2023-11-20 DIAGNOSIS — S72142D Displaced intertrochanteric fracture of left femur, subsequent encounter for closed fracture with routine healing: Secondary | ICD-10-CM | POA: Diagnosis not present

## 2023-11-20 DIAGNOSIS — Z8781 Personal history of (healed) traumatic fracture: Secondary | ICD-10-CM | POA: Diagnosis not present

## 2023-11-20 DIAGNOSIS — Z9889 Other specified postprocedural states: Secondary | ICD-10-CM | POA: Diagnosis not present

## 2023-12-01 ENCOUNTER — Other Ambulatory Visit: Payer: Self-pay | Admitting: Internal Medicine

## 2023-12-01 DIAGNOSIS — J029 Acute pharyngitis, unspecified: Secondary | ICD-10-CM

## 2023-12-01 DIAGNOSIS — I10 Essential (primary) hypertension: Secondary | ICD-10-CM

## 2023-12-02 NOTE — Telephone Encounter (Signed)
 Requested Prescriptions  Pending Prescriptions Disp Refills   amLODipine (NORVASC) 10 MG tablet [Pharmacy Med Name: AMLODIPINE BESYLATE 10MG  TABLETS] 90 tablet 0    Sig: TAKE 1 TABLET(10 MG) BY MOUTH DAILY     Cardiovascular: Calcium Channel Blockers 2 Passed - 12/02/2023  8:41 AM      Passed - Last BP in normal range    BP Readings from Last 1 Encounters:  11/13/23 122/62         Passed - Last Heart Rate in normal range    Pulse Readings from Last 1 Encounters:  10/31/23 (!) 51         Passed - Valid encounter within last 6 months    Recent Outpatient Visits           2 months ago Pure hypercholesterolemia   [place] [facility] [place], [place], NP   2 months ago Acquired hypothyroidism   [place] [facility] [place], Gentry Fitz A, RPH-CPP   6 months ago Acquired hypothyroidism   [place] [facility] [place], Salvadore [place], NP   8 months ago Right sided sciatica   [place] [facility] [place], Salvadore Oxford, NP   10 months ago Acquired hypothyroidism   [place] [facility] [place], Salvadore Oxford, NP       Future Appointments             In 1 month Charlsie Quest, NP [facility] [place]   In 3 months [place], Salvadore Oxford, NP [place] [facility]             metoprolol succinate (TOPROL-XL) 50 MG 24 hr tablet [Pharmacy Med Name: METOPROLOL ER SUCCINATE 50MG  TABS] 90 tablet 0    Sig: TAKE 1 TABLET BY MOUTH EVERY DAY WITH OR IMMEDIATELY FOLLOWING A MEAL     Cardiovascular:  Beta Blockers Passed - 12/02/2023  8:41 AM      Passed - Last BP in normal range    BP Readings from Last 1 Encounters:  11/13/23 122/62         Passed - Last Heart Rate in normal range    Pulse Readings from Last 1 Encounters:  10/31/23 (!) 51         Passed - Valid encounter within last 6 months    Recent Outpatient Visits           2 months ago Pure hypercholesterolemia   [facility] [place], Salvadore Oxford, NP   2 months ago Acquired hypothyroidism   [place] [facility] [place], Gentry Fitz A, RPH-CPP   6 months ago Acquired hypothyroidism   [place] [facility] [place], Salvadore Oxford, NP   8 months ago Right sided sciatica   [place] [facility] [place], Salvadore Oxford, NP   10 months ago Acquired hypothyroidism   [place] [facility] [place], Salvadore Oxford, NP       Future Appointments             In 1 month Charlsie Quest, NP [place] [facility]   In 3 months [place], Salvadore Oxford, NP [place] [facility]             losartan (COZAAR) 100 MG tablet [Pharmacy Med Name: LOSARTAN 100MG  TABLETS] 90 tablet     Sig: TAKE 1 TABLET(100 MG) BY  MOUTH DAILY     Cardiovascular:  Angiotensin Receptor Blockers Passed - 12/02/2023  8:41 AM      Passed - Cr in normal range and within 180 days    Creat  Date Value Ref Range Status  09/30/2023 0.63 0.60 - 0.95 mg/dL Final   Creatinine, Ser  Date Value Ref Range Status  10/30/2023 0.82 0.57 - 1.00 mg/dL Final         Passed - K in normal range and within 180 days    Potassium  Date Value Ref Range Status  10/30/2023 3.7 3.5 - 5.2 mmol/L Final  08/26/2012 3.3 (L) 3.5 - 5.1 mmol/L Final         Passed - Patient is not pregnant      Passed - Last BP in normal range    BP Readings from Last 1 Encounters:  11/13/23 122/62         Passed - Valid encounter within last 6 months    Recent Outpatient Visits           2 months ago Pure hypercholesterolemia   [place] [place], Salvadore Oxford, NP   2 months ago Acquired hypothyroidism   [place] [place], Gentry Fitz A, RPH-CPP   6 months ago Acquired hypothyroidism   [place] [facility] [place], Salvadore Oxford, NP   8 months ago Right sided sciatica   [facility] [place], Salvadore Oxford, NP   10 months ago Acquired hypothyroidism   [place] [facility] [place], Salvadore Oxford, NP       Future Appointments             In 1 month Charlsie Quest, NP [place] [facility] [place]   In 3 months [place], Salvadore Oxford, NP [facility], [facility]

## 2023-12-03 ENCOUNTER — Other Ambulatory Visit: Payer: Self-pay | Admitting: Internal Medicine

## 2023-12-03 DIAGNOSIS — F5104 Psychophysiologic insomnia: Secondary | ICD-10-CM

## 2023-12-04 NOTE — Telephone Encounter (Signed)
 Requested Prescriptions  Pending Prescriptions Disp Refills   traZODone (DESYREL) 50 MG tablet [Pharmacy Med Name: TRAZODONE 50MG  TABLETS] 90 tablet 0    Sig: TAKE 1 TABLET(50 MG) BY MOUTH AT BEDTIME AS NEEDED FOR SLEEP     Psychiatry: Antidepressants - Serotonin Modulator Passed - 12/04/2023 11:14 AM      Passed - Valid encounter within last 6 months    Recent Outpatient Visits           2 months ago Pure hypercholesterolemia   [place] [facility] [place], Salvadore Oxford, NP   2 months ago Acquired hypothyroidism   [place] [facility] [place], Gentry Fitz A, RPH-CPP   7 months ago Acquired hypothyroidism   [place] [facility] [place], Salvadore Oxford, NP   8 months ago Right sided sciatica   [place] [facility] [place], Salvadore Oxford, NP   10 months ago Acquired hypothyroidism   [place] [facility] [place], Salvadore Oxford, NP       Future Appointments             In 1 month Charlsie Quest, NP  [facility] [place]   In 3 months [place], Salvadore Oxford, NP [facility], [facility]

## 2023-12-09 ENCOUNTER — Telehealth: Payer: Self-pay

## 2023-12-09 NOTE — Telephone Encounter (Signed)
 Husband stopped by the office this morning with concerns about patients walking and her memory. He states she can't even remember to take medications or when she took them, will constantly ask the same questions over and over, asked him the other day what town they lived in, etc. Appointment made for Friday to discuss further.

## 2023-12-09 NOTE — Telephone Encounter (Signed)
 Will discuss at upcoming appointment

## 2023-12-11 ENCOUNTER — Ambulatory Visit (INDEPENDENT_AMBULATORY_CARE_PROVIDER_SITE_OTHER): Admitting: Internal Medicine

## 2023-12-11 ENCOUNTER — Encounter: Payer: Self-pay | Admitting: Internal Medicine

## 2023-12-11 VITALS — BP 102/62 | Ht 63.0 in | Wt 149.0 lb

## 2023-12-11 DIAGNOSIS — G309 Alzheimer's disease, unspecified: Secondary | ICD-10-CM

## 2023-12-11 DIAGNOSIS — Z96642 Presence of left artificial hip joint: Secondary | ICD-10-CM

## 2023-12-11 DIAGNOSIS — F0280 Dementia in other diseases classified elsewhere without behavioral disturbance: Secondary | ICD-10-CM | POA: Insufficient documentation

## 2023-12-11 DIAGNOSIS — F0150 Vascular dementia without behavioral disturbance: Secondary | ICD-10-CM

## 2023-12-11 DIAGNOSIS — R269 Unspecified abnormalities of gait and mobility: Secondary | ICD-10-CM

## 2023-12-11 DIAGNOSIS — H6993 Unspecified Eustachian tube disorder, bilateral: Secondary | ICD-10-CM

## 2023-12-11 MED ORDER — DONEPEZIL HCL 5 MG PO TBDP: 5.0000 mg | ORAL_TABLET | Freq: Every day | ORAL | 1 refills | Status: DC

## 2023-12-11 NOTE — Progress Notes (Signed)
 Subjective:    Patient ID: [name], female    DOB: [birth year], 83 y.o.   MRN: [id number]  HPI  Discussed the use of AI scribe software for clinical note transcription with the patient, who gave verbal consent to proceed.  [name] is an 83 year old female with cognitive impairment who presents with ear congestion and memory issues. She is accompanied by a family member.  She has been experiencing ear congestion since Tuesday, describing her ears as 'stopped up' and having difficulty hearing. There is no associated drainage, congestion, cough, or shortness of breath. She has been taking zyrtec for allergies but has not been using flonase.  She reports worsening memory issues, characterized by difficulty remembering recent conversations and events, often repeating questions shortly after they are answered. A CT scan from February showed age-related atrophy and moderate chronic microvascular ischemic changes. Her family notes that she has had memory issues for years, and her mother also had dementia.  Regarding her mobility, she has a 'penguin walk' with small steps. 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 She is currently receiving physical therapy once a week but is not consistently performing exercises at home as instructed. Her family is concerned about her memory more than her mobility issues.       Review of Systems     Past Medical History:  Diagnosis Date   Allergy    Anemia    Anxiety    Arthritis    Cancer (HCC)    skin cancer   Hyperlipidemia    Hypertension    Hypothyroidism    Sciatica    Sleep apnea    does not use cpap   Thrombocytopenia (HCC)    Thyroid disease     Current Outpatient Medications  Medication Sig Dispense Refill   acetaminophen (TYLENOL) 500 MG tablet Take 500 mg by mouth every 6 (six) hours as needed for moderate pain.      amiodarone (PACERONE) 200 MG tablet Take 1 tablet (200 mg total) by mouth  daily. 30 tablet 0   amLODipine (NORVASC) 10 MG tablet TAKE 1 TABLET(10 MG) BY MOUTH DAILY 90 tablet 0   apixaban (ELIQUIS) 5 MG TABS tablet Take 1 tablet (5 mg total) by mouth 2 (two) times daily. 60 tablet 0   atorvastatin (LIPITOR) 40 MG tablet TAKE 1 TABLET(40 MG) BY MOUTH DAILY 90 tablet 2   citalopram (CELEXA) 20 MG tablet TAKE 1 TABLET(20 MG) BY MOUTH DAILY 90 tablet 1   HYDROcodone-acetaminophen (NORCO/VICODIN) 5-325 MG tablet Take 1 tablet by mouth every 6 (six) hours as needed for severe pain (pain score 7-10). 30 tablet 0   levothyroxine (SYNTHROID) 150 MCG tablet TAKE 1 TABLET(150 MCG) BY MOUTH DAILY 90 tablet 1   metoprolol succinate (TOPROL-XL) 50 MG 24 hr tablet TAKE 1 TABLET BY MOUTH EVERY DAY WITH OR IMMEDIATELY FOLLOWING A MEAL 90 tablet 0   Multiple Vitamin (MULTIVITAMIN) capsule Take 1 capsule by mouth daily.     traZODone (DESYREL) 50 MG tablet TAKE 1 TABLET(50 MG) BY MOUTH AT BEDTIME AS NEEDED FOR SLEEP 90 tablet 0   No current facility-administered medications for this visit.    Allergies  Allergen Reactions   Codeine Nausea Only    Tolerates oxycodone   Morphine Nausea And Vomiting   Ace Inhibitors Cough   Adhesive [Tape] Other (See Comments) and Rash    whelps    Family History  Problem Relation Age of Onset  Dementia Mother    Dementia Sister    Heart attack Brother    Diabetes Brother    Breast cancer Paternal Aunt    Heart attack Son    Hypercalcemia Neg Hx    Cancer Neg Hx     Social History   Socioeconomic History   Marital status: Married    Spouse name: ROBERT   Number of children: 4   Years of education: Not on file   Highest education level: GED or equivalent  Occupational History   Occupation: retired   Occupation: Retired  Tobacco Use   Smoking status: Every Day    Current packs/day: 0.50    Average packs/day: 0.5 packs/day for 40.0 years (20.0 ttl pk-yrs)    Types: Cigarettes   Smokeless tobacco: Never  Vaping Use   Vaping  status: Never Used  Substance and Sexual Activity   Alcohol use: No   Drug use: No   Sexual activity: Not Currently  Other Topics Concern   Not on file  Social History Narrative   Not on file   Social Drivers of Health   Financial Resource Strain: Low Risk  (05/16/2023)   Received from [facility]   Overall Financial Resource Strain (CARDIA)    Difficulty of Paying Living Expenses: Not very hard  Food Insecurity: No Food Insecurity (10/04/2023)   Hunger Vital Sign    Worried About Running Out of Food in the Last Year: Never true    Ran Out of Food in the Last Year: Never true  Transportation Needs: No Transportation Needs (10/04/2023)   PRAPARE - Administrator, Civil Service (Medical): No    Lack of Transportation (Non-Medical): No  Physical Activity: Inactive (02/03/2023)   Exercise Vital Sign    Days of Exercise per Week: 0 days    Minutes of Exercise per Session: 30 min  Stress: No Stress Concern Present (08/18/2023)   [employer] of Occupational Health - Occupational Stress Questionnaire    Feeling of Stress : Only a little  Social Connections: Moderately Integrated (10/04/2023)   Social Connection and Isolation Panel [NHANES]    Frequency of Communication with Friends and Family: More than three times a week    Frequency of Social Gatherings with Friends and Family: Twice a week    Attends Religious Services: More than 4 times per year    Active Member of [employer] or Organizations: No    Attends Banker Meetings: Never    Marital Status: Married  Catering manager Violence: Not At Risk (10/04/2023)   Humiliation, Afraid, Rape, and Kick questionnaire    Fear of Current or Ex-Partner: No    Emotionally Abused: No    Physically Abused: No    Sexually Abused: No     Constitutional: Denies fever, malaise, fatigue, headache or abrupt weight changes.  HEENT: Patient reports ear fullness.  Denies eye pain, eye redness, ear pain,  ringing in the ears, wax buildup, runny nose, nasal congestion, bloody nose, or sore throat. Respiratory: Denies difficulty breathing, shortness of breath, cough or sputum production.   Cardiovascular: Denies chest pain, chest tightness, palpitations or swelling in the hands or feet.  Gastrointestinal: Denies abdominal pain, bloating, constipation, diarrhea or blood in the stool.  GU: Pt reports urge incontinence. Denies frequency, pain with urination, burning sensation, blood in urine, odor or discharge. Musculoskeletal: Patient reports joint pain, difficulty with gait.  Denies decrease in range of motion,  muscle pain or joint swelling.  Skin: Denies redness, rashes, lesions or ulcercations.  Neurological: Patient reports insomnia, restless legs, difficulty with balance and difficulty with memory. Denies dizziness, difficulty with speech or problems with coordination.  Psych: Patient has a history of anxiety.  Denies depression, SI/HI.  No other specific complaints in a complete review of systems (except as listed in HPI above).  Objective:   Physical Exam  BP 102/62 (BP Location: Left Arm, Patient Position: Sitting, Cuff Size: Normal)   Ht 5\' 3"  (1.6 m)   Wt 149 lb (67.6 kg)   LMP  (LMP Unknown)   BMI 26.39 kg/m    Wt Readings from Last 3 Encounters:  11/13/23 149 lb (67.6 kg)  10/31/23 149 lb 14.6 oz (68 kg)  10/30/23 150 lb (68 kg)    General: Appears her stated age, overweight, in NAD. HEENT: Head: normal shape and size; Eyes: sclera white, no icterus, conjunctiva pink, PERRLA and EOMs intact;  Neck:  Neck supple, trachea midline. No masses, lumps or thyromegaly present but thyroid tender with palpation.  Cardiovascular: Bradycardic with normal rhythm. S1,S2 noted.  No murmur, rubs or gallops noted. No JVD or BLE edema. No carotid bruits noted. Pulmonary/Chest: Normal effort and coarse breath sounds. No respiratory distress. No wheezes, rales or ronchi noted.  Abdomen: Normal  bowel sounds.  Musculoskeletal: Kyphotic.  Shuffling gait with use of walker.  Neurological: Alert and oriented. Trouble with recall. Repeats herself often. Coordination normal.  Psychiatric: Mood and affect normal. Behavior is normal. Judgment and thought content normal.     BMET    Component Value Date/Time   NA 141 10/30/2023 1111   NA 139 08/26/2012 2035   K 3.7 10/30/2023 1111   K 3.3 (L) 08/26/2012 2035   CL 105 10/30/2023 1111   CL 105 08/26/2012 2035   CO2 22 10/30/2023 1111   CO2 29 08/26/2012 2035   GLUCOSE 95 10/30/2023 1111   GLUCOSE 93 10/15/2023 0421   GLUCOSE 97 08/26/2012 2035   BUN 13 10/30/2023 1111   BUN 13 08/26/2012 2035   CREATININE 0.82 10/30/2023 1111   CREATININE 0.63 09/30/2023 0846   CALCIUM 9.8 10/30/2023 1111   CALCIUM 9.9 08/26/2012 2035   GFRNONAA >60 10/15/2023 0421   GFRNONAA 66 09/11/2020 1147   GFRAA 76 09/11/2020 1147    Lipid Panel     Component Value Date/Time   CHOL 202 (H) 09/30/2023 0846   CHOL 181 12/21/2017 1017   TRIG 136 09/30/2023 0846   HDL 55 09/30/2023 0846   HDL 50 12/21/2017 1017   CHOLHDL 3.7 09/30/2023 0846   LDLCALC 122 (H) 09/30/2023 0846    CBC    Component Value Date/Time   WBC 4.9 10/30/2023 1111   WBC 7.7 10/15/2023 0421   RBC 4.08 10/30/2023 1111   RBC 3.19 (L) 10/15/2023 0421   HGB 13.2 10/30/2023 1111   HCT 40.5 10/30/2023 1111   PLT 124 (L) 10/30/2023 1111   MCV 99 (H) 10/30/2023 1111   MCV 92 08/26/2012 2035   MCH 32.4 10/30/2023 1111   MCH 33.9 10/15/2023 0421   MCHC 32.6 10/30/2023 1111   MCHC 33.1 10/15/2023 0421   RDW 11.7 10/30/2023 1111   RDW 13.1 08/26/2012 2035   LYMPHSABS 1.8 10/14/2023 0438   LYMPHSABS 2.5 12/21/2017 1017   MONOABS 1.3 (H) 10/14/2023 0438   EOSABS 0.1 10/14/2023 0438   EOSABS 0.3 12/21/2017 1017   BASOSABS 0.1 10/14/2023 0438   BASOSABS 0.0 12/21/2017 1017    Hgb A1C  Lab Results  Component Value Date   HGBA1C 5.3 09/30/2023            Assessment  & Plan:   Assessment and Plan    Eustachian tube dysfunction Fluid in the left ear likely due to allergies. - Continue Zyrtec. - Add Flonase nasal spray twice daily x 1 week.  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 February CT shows age-related atrophy and moderate chronic microvascular ischemic changes. Condition is progressive. - Prescribe Aricept 5 mg daily ODT to slow cognitive decline.  Post-hip replacement rehabilitation Limited mobility post-hip replacement. Not performing home exercises as instructed. - Continue current physical therapy regimen. - Reassess physical therapy needs after current therapy concludes.  Goals of Care Discussed long-term care planning due to cognitive impairment. Decision to keep her at home as long as possible.   RTC in 4 months for your annual exam Nicki Reaper, NP

## 2023-12-11 NOTE — Patient Instructions (Signed)
 Dementia Caregiver Guide Dementia is a condition that affects the way the brain works. It often affects thinking and memory. A person with dementia may: Forget things. Have trouble talking or responding to your questions. Have trouble paying attention. Have trouble thinking clearly and making good decisions. Get lost or wander away from home or other places. Have big changes in their mood or emotions. They may: Feel very worried, nervous, or depressed. Have angry outbursts. Be suspicious or accuse you of things. Have childlike behavior and language. Taking care of someone with dementia can be a challenge. The tips below can help you care for the person. How to help manage lifestyle changes Dementia usually gets worse slowly over time. In the early stages, people with dementia can stay safe and take care of themselves with some help. In later stages, they need help with daily tasks like getting dressed, grooming, and going to the bathroom. Communicating When the person is talking and seems frustrated, make eye contact and hold the person's hand. Ask questions that can be answered with a yes or no. Use simple words and a calm voice. Only give one direction at a time. Limit choices for the person. Too many choices can be stressful. Avoid correcting the person in a negative way. If the person can't find the right words, gently try to help. Preventing injury  Keep floors clear. Remove rugs, magazine racks, and floor lamps. Keep hallways well lit, especially at night. Put a handrail and nonslip mat in the bathtub or shower. Put childproof locks on cabinets that have dangerous items in them. These items include medicine, alcohol, guns, cleaning products, and sharp tools. For doors to the outside, put locks where the person can't see or reach them. This helps keep the person from going out of the house and getting lost. Be ready for emergencies. Keep a list of emergency phone numbers and  addresses close by. Remove car keys and lock garage doors so the person doesn't try to drive. Have the person wear a bracelet that tracks where they are and shows that they're a person with memory loss. This should be worn at all times for safety. Helping with daily life  Keep the person on track with their daily routine. Try to identify areas where the person may need help. Be supportive, patient, calm, and encouraging. Gently remind the person that adjusting to changes takes time. Help with the tasks that the person has asked for help with. Keep the person involved in daily tasks and decisions as much as you can. Encourage conversation, but try not to get frustrated if the person struggles to find words or doesn't seem to appreciate your help. Other tips Think about any safety risks and take steps to avoid them. Keep things organized: Organize medicines in a pill box for each day of the week. Keep a calendar in a central place. Use it to remind the person of health care visits or other activities. Create a plan to handle any legal or financial matters. Get help from a professional if needed. Help make sure the person: Takes medicines only as told by their health care providers. Eats regular, healthy meals. They should also drink plenty of fluids. Goes to all scheduled health care appointments. Gets regular sleep. Taking care of yourself Being a caregiver for someone with dementia can be hard. You may feel stressed and have many other emotions. It's important to also take care of yourself. Here are some tips: Find out about services that  can provide short-term care for the person. This is called respite care. It can allow you to take a break when you need one. Find healthy ways to deal with stress. Some ways include: Spending time with other people. Exercising. Meditating or doing deep breathing exercises. Take care of your own health by: Getting enough sleep. Eating healthy  foods. Getting regular exercise. Join a support group with others who are caregivers. These groups can help you: Learn other ways to deal with stress. Share experiences with others. Get emotional comfort and support. Learn about caregiving as the disease gets worse. Find resources in your community. Where to find support: Many people and organizations offer support. These include: Support groups for people with dementia. Support groups for caregivers. Counselors or therapists. Home health care services. Adult day care centers. Where to find more information Alzheimer's Association: WesternT[employer]it Family Caregiver Alliance: caregiver.org Alzheimer's Foundation of [place]: alzfdn.org Contact a health care provider if: The person's health is quickly getting worse. You're no longer able to care for the person. Caring for the person is affecting your physical and emotional health. You're feeling worried, nervous, or depressed about caring for the person. Get help right away if: You feel like the person may hurt themselves or others. The person has talked about taking their own life. These symptoms may be an emergency. Take one of these steps right away: Go to your nearest emergency room. Call 911. Call the National Suicide Prevention Lifeline at (203) 656-8581 or 988. Text the Crisis Text Line at 480-614-0143. This information is not intended to replace advice given to you by your health care provider. Make sure you discuss any questions you have with your health care provider. Document Revised: 12/19/2022 Document Reviewed: 12/19/2022 Elsevier Patient Education  2024 [employer].

## 2023-12-16 ENCOUNTER — Telehealth: Payer: Self-pay

## 2023-12-16 NOTE — Telephone Encounter (Signed)
 Patient notified of all instructions. Verbal understanding

## 2023-12-16 NOTE — Telephone Encounter (Signed)
 Copied from CRM [id number]. Topic: Clinical - Medical Advice >> Dec 16, 2023 10:07 AM Carlatta H wrote: Reason for CRM: Home heath called concerned about patient ear being stopped up and patient may have an ear infection//Please contact the patient//Patient was seen on 3/21 about ears//

## 2023-12-16 NOTE — Telephone Encounter (Signed)
 I have already seen her for this.  She had no evidence of ear infection.  I recommended Flonase twice daily.  She can also take something such as Sudafed daily for 3 days

## 2023-12-18 ENCOUNTER — Ambulatory Visit (INDEPENDENT_AMBULATORY_CARE_PROVIDER_SITE_OTHER): Admitting: Internal Medicine

## 2023-12-18 ENCOUNTER — Ambulatory Visit: Payer: Self-pay

## 2023-12-18 ENCOUNTER — Encounter: Payer: Self-pay | Admitting: Internal Medicine

## 2023-12-18 VITALS — BP 102/62 | Ht 63.0 in | Wt 149.0 lb

## 2023-12-18 DIAGNOSIS — H6992 Unspecified Eustachian tube disorder, left ear: Secondary | ICD-10-CM | POA: Diagnosis not present

## 2023-12-18 DIAGNOSIS — H65191 Other acute nonsuppurative otitis media, right ear: Secondary | ICD-10-CM

## 2023-12-18 MED ORDER — AMOXICILLIN-POT CLAVULANATE 875-125 MG PO TABS: 1.0000 | ORAL_TABLET | Freq: Two times a day (BID) | ORAL | 0 refills | Status: DC

## 2023-12-18 MED ORDER — PREDNISONE 10 MG PO TABS: ORAL_TABLET | ORAL | 0 refills | Status: DC

## 2023-12-18 NOTE — Telephone Encounter (Signed)
  Chief Complaint: bilateral ear pain/decreased hearing Symptoms: mild bilateral ear pain, bilateral decreased hearing Frequency: x 2 weeks, worsening Pertinent Negatives: Patient denies runny nose, cough, fever, water in ears, use of Q tips, headaches, redness or swelling to ears. Disposition: [] ED /[] Urgent Care (no appt availability in office) / [x] Appointment(In office/virtual)/ []  [facility]/ [] Home Care/ [] Refused Recommended Disposition /[] [facility]/ []  Follow-up with PCP Additional Notes: Patient was advised on 12/16/23 to start taking Sudafed and Flonase. Husband states she has been taking it and it is not helping. He states he has to yell for the patient to hear him which is new as of 2 weeks ago. Patient scheduled for acute visit today with PCP to follow up.  Copied from CRM [id number]. Topic: Clinical - Red Word Triage >> Dec 18, 2023  8:03 AM Geroge Baseman wrote: Red Word that prompted transfer to Nurse Triage: Patient was in recently seen for ear pain, told to take OTC meds,  both ears hurting. Getting much worse, Reason for Disposition  Earache  (Exceptions: brief ear pain of < 60 minutes duration, earache occurring during air travel  Answer Assessment - Initial Assessment Questions 1. LOCATION: "Which ear is involved?"     Both ear.  2. ONSET: "When did the ear start hurting"      12/04/23, worsening.  3. SEVERITY: "How bad is the pain?"  (Scale 1-10; mild, moderate or severe)   - MILD (1-3): doesn't interfere with normal activities    - MODERATE (4-7): interferes with normal activities or awakens from sleep    - SEVERE (8-10): excruciating pain, unable to do any normal activities      Mild.  4. URI SYMPTOMS: "Do you have a runny nose or cough?"     Denies.  5. FEVER: "Do you have a fever?" If Yes, ask: "What is your temperature, how was it measured, and when did it start?"     Denies.  6. CAUSE: "Have you been swimming recently?", "How often do you  use Q-TIPS?", "Have you had any recent air travel or scuba diving?"     Denies any water in ears and denies use of Q tips.  7. OTHER SYMPTOMS: "Do you have any other symptoms?" (e.g., headache, stiff neck, dizziness, vomiting, runny nose, decreased hearing)     Decreased hearing (x 2 weeks).  8. PREGNANCY: "Is there any chance you are pregnant?" "When was your last menstrual period?"     N/A.  Protocols used: Davina Poke

## 2023-12-18 NOTE — Telephone Encounter (Signed)
 Will discuss at upcoming appointment

## 2023-12-18 NOTE — Patient Instructions (Signed)
 Eustachian Tube Dysfunction  Eustachian tube dysfunction refers to a condition in which a blockage develops in the narrow passage that connects the middle ear to the back of the nose (eustachian tube). The eustachian tube regulates air pressure in the middle ear by letting air move between the ear and nose. It also helps to drain fluid from the middle ear space. Eustachian tube dysfunction can affect one or both ears. When the eustachian tube does not function properly, air pressure, fluid, or both can build up in the middle ear. What are the causes? This condition occurs when the eustachian tube becomes blocked or cannot open normally. Common causes of this condition include: Ear infections. Colds and other infections that affect the nose, mouth, and throat (upper respiratory tract). Allergies. Irritation from cigarette smoke. Irritation from stomach acid coming up into the esophagus (gastroesophageal reflux). The esophagus is the part of the body that moves food from the mouth to the stomach. Sudden changes in air pressure, such as from descending in an airplane or scuba diving. Abnormal growths in the nose or throat, such as: Growths that line the nose (nasal polyps). Abnormal growth of cells (tumors). Enlarged tissue at the back of the throat (adenoids). What increases the risk? You are more likely to develop this condition if: You smoke. You are overweight. You are a child who has: Certain birth defects of the mouth, such as cleft palate. Large tonsils or adenoids. What are the signs or symptoms? Common symptoms of this condition include: A feeling of fullness in the ear. Ear pain. Clicking or popping noises in the ear. Ringing in the ear (tinnitus). Hearing loss. Loss of balance. Dizziness. Symptoms may get worse when the air pressure around you changes, such as when you travel to an area of high elevation, fly on an airplane, or go scuba diving. How is this diagnosed? This  condition may be diagnosed based on: Your symptoms. A physical exam of your ears, nose, and throat. Tests, such as those that measure: The movement of your eardrum. Your hearing (audiometry). How is this treated? Treatment depends on the cause and severity of your condition. In mild cases, you may relieve your symptoms by moving air into your ears. This is called "popping the ears." In more severe cases, or if you have symptoms of fluid in your ears, treatment may include: Medicines to relieve congestion (decongestants). Medicines that treat allergies (antihistamines). Nasal sprays or ear drops that contain medicines that reduce swelling (steroids). A procedure to drain the fluid in your eardrum. In this procedure, a small tube may be placed in the eardrum to: Drain the fluid. Restore the air in the middle ear space. A procedure to insert a balloon device through the nose to inflate the opening of the eustachian tube (balloon dilation). Follow these instructions at home: Lifestyle Do not do any of the following until your health care provider approves: Travel to high altitudes. Fly in airplanes. Work in a Estate agent or room. Scuba dive. Do not use any products that contain nicotine or tobacco. These products include cigarettes, chewing tobacco, and vaping devices, such as e-cigarettes. If you need help quitting, ask your health care provider. Keep your ears dry. Wear fitted earplugs during showering and bathing. Dry your ears completely after. General instructions Take over-the-counter and prescription medicines only as told by your health care provider. Use techniques to help pop your ears as recommended by your health care provider. These may include: Chewing gum. Yawning. Frequent, forceful swallowing.  Closing your mouth, holding your nose closed, and gently blowing as if you are trying to blow air out of your nose. Keep all follow-up visits. This is important. Contact a  health care provider if: Your symptoms do not go away after treatment. Your symptoms come back after treatment. You are unable to pop your ears. You have: A fever. Pain in your ear. Pain in your head or neck. Fluid draining from your ear. Your hearing suddenly changes. You become very dizzy. You lose your balance. Get help right away if: You have a sudden, severe increase in any of your symptoms. Summary Eustachian tube dysfunction refers to a condition in which a blockage develops in the eustachian tube. It can be caused by ear infections, allergies, inhaled irritants, or abnormal growths in the nose or throat. Symptoms may include ear pain or fullness, hearing loss, or ringing in the ears. Mild cases are treated with techniques to unblock the ears, such as yawning or chewing gum. More severe cases are treated with medicines or procedures. This information is not intended to replace advice given to you by your health care provider. Make sure you discuss any questions you have with your health care provider. Document Revised: 11/19/2020 Document Reviewed: 11/19/2020 Elsevier Patient Education  2024 [employer].

## 2023-12-18 NOTE — Progress Notes (Signed)
 Subjective:    Patient ID: [name], female    DOB: 1941-09-20, 83 y.o.   MRN: [id number]  HPI   Discussed the use of AI scribe software for clinical note transcription with the patient, who gave verbal consent to proceed.  [name] is an 83 year old female who presents with fullness in her ears and hearing loss.  She experiences fullness in her ears and difficulty hearing, describing her ears as 'stopped up'. She attributes this sensation to her sinuses.  This started a few weeks ago.  She was seen 3/21 for the same and advised to use Flonase and Sudafed OTC.  Despite using Flonase and Sudafed, she has not experienced any relief from these symptoms.    Review of Systems     Past Medical History:  Diagnosis Date   Allergy    Anemia    Anxiety    Arthritis    Cancer (HCC)    skin cancer   Hyperlipidemia    Hypertension    Hypothyroidism    Sciatica    Sleep apnea    does not use cpap   Thrombocytopenia (HCC)    Thyroid disease     Current Outpatient Medications  Medication Sig Dispense Refill   acetaminophen (TYLENOL) 500 MG tablet Take 500 mg by mouth every 6 (six) hours as needed for moderate pain.      amiodarone (PACERONE) 200 MG tablet Take 1 tablet (200 mg total) by mouth daily. 30 tablet 0   amLODipine (NORVASC) 10 MG tablet TAKE 1 TABLET(10 MG) BY MOUTH DAILY 90 tablet 0   apixaban (ELIQUIS) 5 MG TABS tablet Take 1 tablet (5 mg total) by mouth 2 (two) times daily. 60 tablet 0   atorvastatin (LIPITOR) 40 MG tablet TAKE 1 TABLET(40 MG) BY MOUTH DAILY 90 tablet 2   cetirizine (ZYRTEC) 10 MG tablet Take 10 mg by mouth daily.     citalopram (CELEXA) 20 MG tablet TAKE 1 TABLET(20 MG) BY MOUTH DAILY 90 tablet 1   donepezil (ARICEPT ODT) 5 MG disintegrating tablet Take 1 tablet (5 mg total) by mouth at bedtime. 90 tablet 1   HYDROcodone-acetaminophen (NORCO/VICODIN) 5-325 MG tablet Take 1 tablet by mouth every 6 (six) hours as needed for severe pain  (pain score 7-10). (Patient not taking: Reported on 12/11/2023) 30 tablet 0   levothyroxine (SYNTHROID) 150 MCG tablet TAKE 1 TABLET(150 MCG) BY MOUTH DAILY 90 tablet 1   metoprolol succinate (TOPROL-XL) 50 MG 24 hr tablet TAKE 1 TABLET BY MOUTH EVERY DAY WITH OR IMMEDIATELY FOLLOWING A MEAL 90 tablet 0   Multiple Vitamin (MULTIVITAMIN) capsule Take 1 capsule by mouth daily.     traZODone (DESYREL) 50 MG tablet TAKE 1 TABLET(50 MG) BY MOUTH AT BEDTIME AS NEEDED FOR SLEEP 90 tablet 0   No current facility-administered medications for this visit.    Allergies  Allergen Reactions   Codeine Nausea Only    Tolerates oxycodone   Morphine Nausea And Vomiting   Ace Inhibitors Cough   Adhesive [Tape] Other (See Comments) and Rash    whelps    Family History  Problem Relation Age of Onset   Dementia Mother    Dementia Sister    Heart attack Brother    Diabetes Brother    Breast cancer Paternal Aunt    Heart attack Son    Hypercalcemia Neg Hx    Cancer Neg Hx     Social History   Socioeconomic History  Marital status: Married    Spouse name: ROBERT   Number of children: 4   Years of education: Not on file   Highest education level: GED or equivalent  Occupational History   Occupation: retired   Occupation: Retired  Tobacco Use   Smoking status: Every Day    Current packs/day: 0.50    Average packs/day: 0.5 packs/day for 40.0 years (20.0 ttl pk-yrs)    Types: Cigarettes   Smokeless tobacco: Never  Vaping Use   Vaping status: Never Used  Substance and Sexual Activity   Alcohol use: No   Drug use: No   Sexual activity: Not Currently  Other Topics Concern   Not on file  Social History Narrative   Not on file   Social Drivers of Health   Financial Resource Strain: Low Risk  (05/16/2023)   Received from [facility]   Overall Financial Resource Strain (CARDIA)    Difficulty of Paying Living Expenses: Not very hard  Food Insecurity: No Food Insecurity  (10/04/2023)   Hunger Vital Sign    Worried About Running Out of Food in the Last Year: Never true    Ran Out of Food in the Last Year: Never true  Transportation Needs: No Transportation Needs (10/04/2023)   PRAPARE - Administrator, Civil Service (Medical): No    Lack of Transportation (Non-Medical): No  Physical Activity: Inactive (02/03/2023)   Exercise Vital Sign    Days of Exercise per Week: 0 days    Minutes of Exercise per Session: 30 min  Stress: No Stress Concern Present (08/18/2023)   [employer] of Occupational Health - Occupational Stress Questionnaire    Feeling of Stress : Only a little  Social Connections: Moderately Integrated (10/04/2023)   Social Connection and Isolation Panel [NHANES]    Frequency of Communication with Friends and Family: More than three times a week    Frequency of Social Gatherings with Friends and Family: Twice a week    Attends Religious Services: More than 4 times per year    Active Member of [employer] or Organizations: No    Attends Banker Meetings: Never    Marital Status: Married  Catering manager Violence: Not At Risk (10/04/2023)   Humiliation, Afraid, Rape, and Kick questionnaire    Fear of Current or Ex-Partner: No    Emotionally Abused: No    Physically Abused: No    Sexually Abused: No     Constitutional: Denies fever, malaise, fatigue, headache or abrupt weight changes.  HEENT: Patient reports ear fullness, difficulty hearing.  Denies eye pain, eye redness, ear pain, ringing in the ears, wax buildup, runny nose, nasal congestion, bloody nose, or sore throat. Respiratory: Denies difficulty breathing, shortness of breath, cough or sputum production.   Cardiovascular: Denies chest pain, chest tightness, palpitations or swelling in the hands or feet.  Neurological: Patient reports insomnia, restless legs, difficulty with balance and difficulty with memory. Denies dizziness, difficulty with speech or problems  with coordination.    No other specific complaints in a complete review of systems (except as listed in HPI above).  Objective:   Physical Exam BP 102/62 (BP Location: Left Arm, Patient Position: Sitting, Cuff Size: Normal)   Ht 5\' 3"  (1.6 m)   Wt 149 lb (67.6 kg)   LMP  (LMP Unknown)   BMI 26.39 kg/m     Wt Readings from Last 3 Encounters:  12/11/23 149 lb (67.6 kg)  11/13/23 149 lb (67.6 kg)  10/31/23 149 lb 14.6 oz (68 kg)    General: Appears her stated age, overweight, in NAD. HEENT: Head: normal shape and size; Eyes: sclera white, no icterus, conjunctiva pink, PERRLA and EOMs intact; Left Ear: TM gray and intact, normal light reflex, + serous effusion; Right Ear: TM red and bulging, + mucoid effusion. Neck: No adenopathy noted. Cardiovascular: Bradycardic with normal rhythm.  Pulmonary/Chest: Normal effort and coarse breath sounds. No respiratory distress. No wheezes, rales or ronchi noted.  Musculoskeletal: Shuffling gait with use of walker.  Neurological: Alert and oriented.   BMET    Component Value Date/Time   NA 141 10/30/2023 1111   NA 139 08/26/2012 2035   K 3.7 10/30/2023 1111   K 3.3 (L) 08/26/2012 2035   CL 105 10/30/2023 1111   CL 105 08/26/2012 2035   CO2 22 10/30/2023 1111   CO2 29 08/26/2012 2035   GLUCOSE 95 10/30/2023 1111   GLUCOSE 93 10/15/2023 0421   GLUCOSE 97 08/26/2012 2035   BUN 13 10/30/2023 1111   BUN 13 08/26/2012 2035   CREATININE 0.82 10/30/2023 1111   CREATININE 0.63 09/30/2023 0846   CALCIUM 9.8 10/30/2023 1111   CALCIUM 9.9 08/26/2012 2035   GFRNONAA >60 10/15/2023 0421   GFRNONAA 66 09/11/2020 1147   GFRAA 76 09/11/2020 1147    Lipid Panel     Component Value Date/Time   CHOL 202 (H) 09/30/2023 0846   CHOL 181 12/21/2017 1017   TRIG 136 09/30/2023 0846   HDL 55 09/30/2023 0846   HDL 50 12/21/2017 1017   CHOLHDL 3.7 09/30/2023 0846   LDLCALC 122 (H) 09/30/2023 0846    CBC    Component Value Date/Time   WBC 4.9  10/30/2023 1111   WBC 7.7 10/15/2023 0421   RBC 4.08 10/30/2023 1111   RBC 3.19 (L) 10/15/2023 0421   HGB 13.2 10/30/2023 1111   HCT 40.5 10/30/2023 1111   PLT 124 (L) 10/30/2023 1111   MCV 99 (H) 10/30/2023 1111   MCV 92 08/26/2012 2035   MCH 32.4 10/30/2023 1111   MCH 33.9 10/15/2023 0421   MCHC 32.6 10/30/2023 1111   MCHC 33.1 10/15/2023 0421   RDW 11.7 10/30/2023 1111   RDW 13.1 08/26/2012 2035   LYMPHSABS 1.8 10/14/2023 0438   LYMPHSABS 2.5 12/21/2017 1017   MONOABS 1.3 (H) 10/14/2023 0438   EOSABS 0.1 10/14/2023 0438   EOSABS 0.3 12/21/2017 1017   BASOSABS 0.1 10/14/2023 0438   BASOSABS 0.0 12/21/2017 1017    Hgb A1C Lab Results  Component Value Date   HGBA1C 5.3 09/30/2023            Assessment & Plan:   Assessment and Plan    Right otitis media, ETD, left: Persistent effusion in both ears with right ear infection. Lack of response to Flonase and Sudafed. - Prescribed Augmentin 875 mg/125 mg BID for 10 days for right ear infection. - Prescribed prednisone taper x 6 days to reduce middle ear effusion. - Refer to otolaryngologist if no improvement.        RTC in 4 months for your annual exam Nicki Reaper, NP

## 2023-12-21 NOTE — Patient Outreach (Signed)
  Care Management   Visit Note  12/21/2023 Name: [name] MRN: [id number] DOB: 09-01-1941  Subjective: [name] is a 83 y.o. year old female who is a primary care patient of [name], NP.   Spoke with [name]'s spouse [name] today. Reports she was out at the time of the call. Will attempt to complete outreach later today if returns and is available.    PLAN Pending call back from [name]    [facility] RN Care Manager Direct Dial: [phone]  Fax: [phone] Website: [email or web address]

## 2023-12-27 ENCOUNTER — Other Ambulatory Visit: Payer: Self-pay | Admitting: Internal Medicine

## 2023-12-27 DIAGNOSIS — F419 Anxiety disorder, unspecified: Secondary | ICD-10-CM

## 2023-12-28 NOTE — Telephone Encounter (Signed)
 Requested Prescriptions  Pending Prescriptions Disp Refills   citalopram (CELEXA) 20 MG tablet [Pharmacy Med Name: CITALOPRAM 20MG  TABLETS] 90 tablet 1    Sig: TAKE 1 TABLET(20 MG) BY MOUTH DAILY     Psychiatry:  Antidepressants - SSRI Passed - 12/28/2023  5:35 PM      Passed - Valid encounter within last 6 months    Recent Outpatient Visits           1 week ago ETD (Eustachian tube dysfunction), left   [place] [facility] [place], Salvadore Oxford, NP   2 weeks ago Mixed Alzheimer and vascular dementia [facility])   [place] [facility] [place], [place] W, NP   1 month ago Closed fracture of left hip with routine healing, subsequent encounter   [place] [facility] [place], Salvadore Oxford, NP       Future Appointments             In 1 month Charlsie Quest, NP [facility] [place]   In 3 months [place], Salvadore Oxford, NP [facility], [facility]

## 2024-01-01 ENCOUNTER — Ambulatory Visit: Payer: Self-pay

## 2024-01-01 ENCOUNTER — Telehealth: Payer: Self-pay

## 2024-01-01 VITALS — BP 102/62 | Ht 63.0 in | Wt 149.0 lb

## 2024-01-01 DIAGNOSIS — Z0000 Encounter for general adult medical examination without abnormal findings: Secondary | ICD-10-CM

## 2024-01-01 DIAGNOSIS — S72142D Displaced intertrochanteric fracture of left femur, subsequent encounter for closed fracture with routine healing: Secondary | ICD-10-CM | POA: Diagnosis not present

## 2024-01-01 NOTE — Telephone Encounter (Signed)
 She already has an appointment with cardiology on 5/8 to see the NP.  She can request to see Dr. Mariah Milling at that time.

## 2024-01-01 NOTE — Progress Notes (Signed)
 Because this visit was a virtual/telehealth visit,  certain criteria was not obtained, such a blood pressure, CBG if applicable, and timed get up and go. Any medications not marked as "taking" were not mentioned during the medication reconciliation part of the visit. Any vitals not documented were not able to be obtained due to this being a telehealth visit or patient was unable to self-report a recent blood pressure reading due to a lack of equipment at home via telehealth. Vitals that have been documented are verbally provided by the patient.   Subjective:   [name] is a 83 y.o. who presents for a Medicare Wellness preventive visit.  Visit Complete: Virtual I connected with  [name] on 01/01/24 by a audio enabled telemedicine application and verified that I am speaking with the correct person using two identifiers.  Patient Location: Home  Provider Location: Home Office  I discussed the limitations of evaluation and management by telemedicine. The patient expressed understanding and agreed to proceed.  Vital Signs: Because this visit was a virtual/telehealth visit, some criteria may be missing or patient reported. Any vitals not documented were not able to be obtained and vitals that have been documented are patient reported.  VideoDeclined- This patient declined Librarian, academic. Therefore the visit was completed with audio only.  Persons Participating in Visit: Patient.  AWV Questionnaire: No: Patient Medicare AWV questionnaire was not completed prior to this visit.  Cardiac Risk Factors include: advanced age (>47men, >53 women);smoking/ tobacco exposure;dyslipidemia;hypertension;Other (see comment), Risk factor comments: A-fib     Objective:    Today's Vitals   01/01/24 1145  BP: 102/62  Weight: 149 lb (67.6 kg)  Height: 5\' 3"  (1.6 m)   Body mass index is 26.39 kg/m.     01/01/2024   11:45 AM 10/31/2023    5:16 PM 10/04/2023    12:06 AM 10/03/2023    4:16 PM 12/26/2022    2:38 PM 07/28/2022    6:28 AM 07/18/2022    9:06 AM  Advanced Directives  Does Patient Have a Medical Advance Directive? Yes Yes Yes Yes No Yes Yes  Type of Estate agent of [place];Living will Living will Living will Living will  Healthcare Power of [place];Living will   Does patient want to make changes to medical advance directive? No - Patient declined No - Patient declined No - Patient declined   No - Patient declined   Copy of Healthcare Power of Attorney in Chart? No - copy requested     No - copy requested   Would patient like information on creating a medical advance directive?     No - Patient declined      Current Medications (verified) Outpatient Encounter Medications as of 01/01/2024  Medication Sig   acetaminophen (TYLENOL) 500 MG tablet Take 500 mg by mouth every 6 (six) hours as needed for moderate pain.    amiodarone (PACERONE) 200 MG tablet Take 1 tablet (200 mg total) by mouth daily.   amLODipine (NORVASC) 10 MG tablet TAKE 1 TABLET(10 MG) BY MOUTH DAILY   amoxicillin-clavulanate (AUGMENTIN) 875-125 MG tablet Take 1 tablet by mouth 2 (two) times daily.   apixaban (ELIQUIS) 5 MG TABS tablet Take 1 tablet (5 mg total) by mouth 2 (two) times daily.   atorvastatin (LIPITOR) 40 MG tablet TAKE 1 TABLET(40 MG) BY MOUTH DAILY   cetirizine (ZYRTEC) 10 MG tablet Take 10 mg by mouth daily.   citalopram (CELEXA) 20 MG tablet TAKE  1 TABLET(20 MG) BY MOUTH DAILY   donepezil (ARICEPT ODT) 5 MG disintegrating tablet Take 1 tablet (5 mg total) by mouth at bedtime.   HYDROcodone-acetaminophen (NORCO/VICODIN) 5-325 MG tablet Take 1 tablet by mouth every 6 (six) hours as needed for severe pain (pain score 7-10). (Patient not taking: Reported on 12/18/2023)   levothyroxine (SYNTHROID) 150 MCG tablet TAKE 1 TABLET(150 MCG) BY MOUTH DAILY   metoprolol succinate (TOPROL-XL) 50 MG 24 hr tablet TAKE 1 TABLET BY MOUTH EVERY DAY WITH  OR IMMEDIATELY FOLLOWING A MEAL   Multiple Vitamin (MULTIVITAMIN) capsule Take 1 capsule by mouth daily.   predniSONE (DELTASONE) 10 MG tablet Take 6 tabs on day 1, 5 tabs on day 2, 4 tabs on day 3, 3 tabs on day 4, 2 tabs on day 5, 1 tab on day 6   traZODone (DESYREL) 50 MG tablet TAKE 1 TABLET(50 MG) BY MOUTH AT BEDTIME AS NEEDED FOR SLEEP   No facility-administered encounter medications on file as of 01/01/2024.    Allergies (verified) Codeine, Morphine, Ace inhibitors, and Adhesive [tape]   History: Past Medical History:  Diagnosis Date   Allergy    Anemia    Anxiety    Arthritis    Cancer (HCC)    skin cancer   Hyperlipidemia    Hypertension    Hypothyroidism    Sciatica    Sleep apnea    does not use cpap   Thrombocytopenia (HCC)    Thyroid disease    Past Surgical History:  Procedure Laterality Date   ABDOMINAL HYSTERECTOMY     APPENDECTOMY     BACK SURGERY     lower   BILATERAL CARPAL TUNNEL RELEASE     BREAST CYST ASPIRATION Left    neg   CARDIOVERSION N/A 10/08/2023   Procedure: CARDIOVERSION;  Surgeon: Antonieta Iba, MD;  Location: [facility];  Service: Cardiovascular;  Laterality: N/A;   COLONOSCOPY     COLONOSCOPY WITH PROPOFOL N/A 04/23/2021   Procedure: COLONOSCOPY WITH PROPOFOL;  Surgeon: Pasty Spillers, MD;  Location: [facility];  Service: Endoscopy;  Laterality: N/A;   ENTROPIAN REPAIR Left 06/08/2020   Procedure: ENTROPION REPAIR, SUTURES ENTROPION REPAIR, EXTENSIVE LEFT;  Surgeon: Imagene Riches, MD;  Location: [facility];  Service: Ophthalmology;  Laterality: Left;   INTRAMEDULLARY (IM) NAIL INTERTROCHANTERIC Left 10/04/2023   Procedure: INTRAMEDULLARY (IM) NAIL INTERTROCHANTERIC;  Surgeon: Christena Flake, MD;  Location: [facility];  Service: Orthopedics;  Laterality: Left;   JOINT REPLACEMENT     knees   KNEE SURGERY Bilateral    KNEE SURGERY     OPEN REDUCTION INTERNAL FIXATION (ORIF) DISTAL RADIAL FRACTURE Left 06/23/2019    Procedure: OPEN REDUCTION INTERNAL FIXATION (ORIF) DISTAL RADIAL FRACTURE;  Surgeon: Kennedy Bucker, MD;  Location: [facility];  Service: Orthopedics;  Laterality: Left;   PARATHYROIDECTOMY  04/27/2020   salivary stone remove     SPINAL CORD STIMULATOR IMPLANT Right    SPINAL CORD STIMULATOR REMOVAL N/A 07/28/2022   Procedure: REMOVAL OF SPINAL CORD STIMULATOR;  Surgeon: Venetia Night, MD;  Location: [facility];  Service: Neurosurgery;  Laterality: N/A;   THYROIDECTOMY Right 04/27/2020   Procedure: PARATHYROIDECTOMY;  Surgeon: Luretha Murphy, MD;  Location: [facility];  Service: General;  Laterality: Right;   TOTAL KNEE REVISION Right 08/13/2015   Procedure: TOTAL KNEE REVISION;  Surgeon: Durene Romans, MD;  Location: [facility];  Service: Orthopedics;  Laterality: Right;   Family History  Problem Relation Age of Onset  Dementia Mother    Dementia Sister    Heart attack Brother    Diabetes Brother    Breast cancer Paternal Aunt    Heart attack Son    Hypercalcemia Neg Hx    Cancer Neg Hx    Social History   Socioeconomic History   Marital status: Married    Spouse name: ROBERT   Number of children: 4   Years of education: Not on file   Highest education level: GED or equivalent  Occupational History   Occupation: retired   Occupation: Retired  Tobacco Use   Smoking status: Every Day    Current packs/day: 0.50    Average packs/day: 0.5 packs/day for 40.0 years (20.0 ttl pk-yrs)    Types: Cigarettes   Smokeless tobacco: Never  Vaping Use   Vaping status: Never Used  Substance and Sexual Activity   Alcohol use: No   Drug use: No   Sexual activity: Not Currently  Other Topics Concern   Not on file  Social History Narrative   Not on file   Social Drivers of Health   Financial Resource Strain: Medium Risk (01/01/2024)   Overall Financial Resource Strain (CARDIA)    Difficulty of Paying Living Expenses: Somewhat hard  Food Insecurity: No Food Insecurity (01/01/2024)   Hunger  Vital Sign    Worried About Running Out of Food in the Last Year: Never true    Ran Out of Food in the Last Year: Never true  Transportation Needs: No Transportation Needs (01/01/2024)   PRAPARE - Administrator, Civil Service (Medical): No    Lack of Transportation (Non-Medical): No  Physical Activity: Inactive (01/01/2024)   Exercise Vital Sign    Days of Exercise per Week: 0 days    Minutes of Exercise per Session: 30 min  Stress: No Stress Concern Present (01/01/2024)   [employer] of Occupational Health - Occupational Stress Questionnaire    Feeling of Stress : Only a little  Social Connections: Moderately Integrated (01/01/2024)   Social Connection and Isolation Panel [NHANES]    Frequency of Communication with Friends and Family: More than three times a week    Frequency of Social Gatherings with Friends and Family: Twice a week    Attends Religious Services: More than 4 times per year    Active Member of [employer] or Organizations: No    Attends Engineer, structural: Never    Marital Status: Married    Tobacco Counseling Ready to quit: Not Answered Counseling given: Not Answered    Clinical Intake:  Pre-visit preparation completed: Yes  Pain : No/denies pain     BMI - recorded: 26.39 Nutritional Status: BMI 25 -29 Overweight Nutritional Risks: None Diabetes: No  Lab Results  Component Value Date   HGBA1C 5.3 09/30/2023   HGBA1C 5.3 03/30/2023   HGBA1C 5.6 09/26/2022     How often do you need to have someone help you when you read instructions, pamphlets, or other written materials from your doctor or pharmacy?: 1 - Never  Interpreter Needed?: No  Information entered by :: [employer]   Activities of Daily Living     01/01/2024   11:51 AM 10/04/2023   12:06 AM  In your present state of health, do you have any difficulty performing the following activities:  Hearing? 0 0  Vision? 0 0  Difficulty concentrating or making  decisions? 0 0  Walking or climbing stairs? 0   Dressing or bathing? 0  Doing errands, shopping? 0 0  Preparing Food and eating ? N   Using the Toilet? N   In the past six months, have you accidently leaked urine? N   Do you have problems with loss of bowel control? N   Managing your Medications? N   Managing your Finances? N   Housekeeping or managing your Housekeeping? N     Patient Care Team: [name], NP as PCP - General (Internal Medicine) Ronney Asters Jackelyn Poling, RPH-CPP as Pharmacist Earna Coder, MD as Consulting Physician (Oncology) Juanell Fairly, RN as Case Manager  Indicate any recent Medical Services you may have received from other than [facility] providers in the past year (date may be approximate).     Assessment:   This is a routine wellness examination for [place].  Hearing/Vision screen Hearing Screening - Comments:: Patient has some wax in ears Vision Screening - Comments:: Wears glasses   Goals Addressed             This Visit's Progress    Patient Stated       Patient would like to hear better       Depression Screen     01/01/2024   11:52 AM 09/30/2023    8:30 AM 03/30/2023    9:15 AM 02/05/2023    9:11 AM 12/26/2022    2:37 PM 12/01/2022    9:38 AM 08/27/2022   11:05 AM  PHQ 2/9 Scores  PHQ - 2 Score 0 3 2 3  0 2 0  PHQ- 9 Score 0 8 8 5  0 5     Fall Risk     01/01/2024   11:51 AM 09/30/2023    8:30 AM 03/30/2023    9:16 AM 02/05/2023    9:12 AM 01/26/2023   10:02 AM  Fall Risk   Falls in the past year? 0 1 1 1 1   Number falls in past yr: 0 1 1 1 1   Injury with Fall? 0 0 0 0 0  Risk for fall due to : No Fall Risks  Impaired balance/gait Impaired balance/gait Impaired balance/gait;Other (Comment)  Risk for fall due to: Comment     per patients husband one fall occured about 6 weeks ago, another one 3 weeks ago, one at church, one at home  Follow up Falls evaluation completed    Falls evaluation completed;Education provided;Falls  prevention discussed    MEDICARE RISK AT HOME:  Medicare Risk at Home Any stairs in or around the home?: Yes If so, are there any without handrails?: No Home free of loose throw rugs in walkways, pet beds, electrical cords, etc?: Yes Adequate lighting in your home to reduce risk of falls?: Yes Life alert?: No Use of a cane, walker or w/c?: No Grab bars in the bathroom?: Yes Shower chair or bench in shower?: Yes Elevated toilet seat or a handicapped toilet?: Yes  TIMED UP AND GO:  Was the test performed?  No  Cognitive Function: 6CIT completed        01/01/2024   11:47 AM 12/26/2022    2:44 PM 12/16/2021    1:52 PM 06/19/2020    1:26 PM  6CIT Screen  What Year? 0 points 0 points 0 points 0 points  What month? 0 points 0 points 0 points 0 points  What time? 0 points 0 points 0 points 0 points  Count back from 20 0 points 0 points 2 points 0 points  Months in reverse 0 points 4 points 2  points 0 points  Repeat phrase 0 points 10 points 10 points 0 points  Total Score 0 points 14 points 14 points 0 points    Immunizations Immunization History  Administered Date(s) Administered   Fluad Quad(high Dose 65+) 06/06/2019, 06/10/2021, 06/23/2022   Influenza, High Dose Seasonal PF 06/04/2017, 06/29/2018   Influenza,inj,Quad PF,6+ Mos 07/28/2023   Influenza-Unspecified 04/22/2020   PFIZER(Purple Top)SARS-COV-2 Vaccination 10/31/2019, 11/28/2019, 07/03/2020   PNEUMOCOCCAL CONJUGATE-20 07/28/2023   Pfizer Covid-19 Vaccine Bivalent Booster 45yrs & up 08/10/2021   Pfizer(Comirnaty)Fall Seasonal Vaccine 12 years and older 07/28/2023   Pneumococcal Conjugate-13 08/05/2018   Pneumococcal Polysaccharide-23 06/04/2017, 06/08/2020   Tdap 07/28/2023    Screening Tests Health Maintenance  Topic Date Due   Zoster Vaccines- Shingrix (1 of 2) 02/10/2024 (Originally 05/18/1991)   INFLUENZA VACCINE  04/22/2024   Medicare Annual Wellness (AWV)  12/31/2024   DTaP/Tdap/Td (2 - Td or Tdap)  07/27/2033   Pneumonia Vaccine 105+ Years old  Completed   DEXA SCAN  Completed   HPV VACCINES  Aged Out   Meningococcal B Vaccine  Aged Out   Lung Cancer Screening  Discontinued   Colonoscopy  Discontinued   COVID-19 Vaccine  Discontinued   Hepatitis C Screening  Discontinued    Health Maintenance  There are no preventive care reminders to display for this patient. Health Maintenance Items Addressed:n/a  Additional Screening:  Vision Screening: Recommended annual ophthalmology exams for early detection of glaucoma and other disorders of the eye.  Dental Screening: Recommended annual dental exams for proper oral hygiene  Community Resource Referral / Chronic Care Management: CRR required this visit?  No   CCM required this visit?  No     Plan:     I have personally reviewed and noted the following in the patient's chart:   Medical and social history Use of alcohol, tobacco or illicit drugs  Current medications and supplements including opioid prescriptions. Patient is not currently taking opioid prescriptions. Functional ability and status Nutritional status Physical activity Advanced directives List of other physicians Hospitalizations, surgeries, and ER visits in previous 12 months Vitals Screenings to include cognitive, depression, and falls Referrals and appointments  In addition, I have reviewed and discussed with patient certain preventive protocols, quality metrics, and best practice recommendations. A written personalized care plan for preventive services as well as general preventive health recommendations were provided to patient.     Rudi Heap, [place]   01/01/2024   After Visit Summary: (MyChart) Due to this being a telephonic visit, the after visit summary with patients personalized plan was offered to patient via MyChart   Notes: Nothing significant to report at this time.

## 2024-01-01 NOTE — Patient Instructions (Signed)
 Ms. Barletta , Thank you for taking time to come for your Medicare Wellness Visit. I appreciate your ongoing commitment to your health goals. Please review the following plan we discussed and let me know if I can assist you in the future.   Referrals/Orders/Follow-Ups/Clinician Recommendations: Follow Up in 1 year for next annual wellness visit.  This is a list of the screening recommended for you and due dates:  Health Maintenance  Topic Date Due   Zoster (Shingles) Vaccine (1 of 2) 02/10/2024*   Flu Shot  04/22/2024   Medicare Annual Wellness Visit  12/31/2024   DTaP/Tdap/Td vaccine (2 - Td or Tdap) 07/27/2033   Pneumonia Vaccine  Completed   DEXA scan (bone density measurement)  Completed   HPV Vaccine  Aged Out   Meningitis B Vaccine  Aged Out   Screening for Lung Cancer  Discontinued   Colon Cancer Screening  Discontinued   COVID-19 Vaccine  Discontinued   Hepatitis C Screening  Discontinued  *Topic was postponed. The date shown is not the original due date.    Advanced directives: (Copy Requested) Please bring a copy of your health care power of attorney and living will to the office to be added to your chart at your convenience. You can mail to [facility] [address] [place], [place] [address] or email to [email or web address]  Next Medicare Annual Wellness Visit scheduled for next year: Yes

## 2024-01-01 NOTE — Telephone Encounter (Signed)
 Advised husband per [place]. He verbalized understanding.

## 2024-01-01 NOTE — Telephone Encounter (Signed)
 Copied from CRM [id number]. Topic: Referral - Request for Referral >> Jan 01, 2024 12:02 PM Franchot Heidelberg wrote: Did the patient discuss referral with their provider in the last year? No (If No - schedule appointment) (If Yes - send message)  Appointment offered? No  Type of order/referral and detailed reason for visit: Cardiology   Preference of office, provider, location: Wants to Dr. Julien Nordmann   If referral order, have you been seen by this specialty before? Yes (If Yes, this issue or another issue? When? Where?  Can we respond through MyChart? Yes

## 2024-01-04 ENCOUNTER — Ambulatory Visit: Payer: Self-pay | Admitting: *Deleted

## 2024-01-04 ENCOUNTER — Telehealth: Payer: Self-pay

## 2024-01-04 NOTE — Telephone Encounter (Signed)
  Chief Complaint: bilateral hearing loss and ears stopped up  since treatment 12/18/23. Symptoms: bilateral ears stopped up hard of hearing. Took prescribed medications prednisone and augmentin Frequency: 2 weeks  Pertinent Negatives: Patient denies ear pain no fever no sinus issues. No drainage from ears, no dizziness Disposition: [] ED /[] Urgent Care (no appt availability in office) / [x] Appointment(In office/virtual)/ []  [facility]/ [] Home Care/ [] Refused Recommended Disposition /[] [place] [place] [facility]/ []  Follow-up with PCP Additional Notes:   No available appt with PCP. Scheduled appt for tomorrow with other provider.      Copied from CRM [id number]. Topic: Clinical - Red Word Triage >> Jan 04, 2024  3:44 PM Marissa P wrote: Red Word that prompted transfer to Nurse Triage: Husband [name] called for his wife (Patient) says her ears are stocked up and she can't hear. Warm connected her over to a nurse. Reason for Disposition  Ear congestion present > 48 hours  Answer Assessment - Initial Assessment Questions 1. LOCATION: "Which ear is involved?"       Bilateral ears  2. SENSATION: "Describe how the ear feels." (e.g. stuffy, full, plugged)."      Stuffy clogged  3. ONSET:  "When did the ear symptoms start?"       2 weeks ago  4. PAIN: "Do you also have an earache?" If Yes, ask: "How bad is it?" (Scale 1-10; or mild, moderate, severe)     Denies pain 5. CAUSE: "What do you think is causing the ear congestion?"     Not sure  6. URI: "Do you have a runny nose or cough?"      No  7. NASAL ALLERGIES: "Are there symptoms of hay fever, such as sneezing or a clear nasal discharge?"     No .has seen for similar issues 12/18/23 not better since taking prescribed medication . Loss of hearing  8. PREGNANCY: "Is there any chance you are pregnant?" "When was your last menstrual period?"     na  Protocols used: Ear - Congestion-A-AH

## 2024-01-04 NOTE — Telephone Encounter (Signed)
 [name] notified of verbal orders.

## 2024-01-04 NOTE — Telephone Encounter (Signed)
 Copied from CRM [id number]. Topic: Clinical - Home Health Verbal Orders >> Jan 04, 2024  2:49 PM [place] wrote: Caller/Agency: Larinda Plover with [facility] Callback Number: [phone] Service Requested: Physical Therapy Frequency: 1 time a week for 9 weeks Any new concerns about the patient? No

## 2024-01-04 NOTE — Telephone Encounter (Signed)
 Ok for verbal orders as requested.

## 2024-01-05 ENCOUNTER — Other Ambulatory Visit: Payer: Self-pay

## 2024-01-05 ENCOUNTER — Encounter: Payer: Self-pay | Admitting: Family Medicine

## 2024-01-05 ENCOUNTER — Ambulatory Visit (INDEPENDENT_AMBULATORY_CARE_PROVIDER_SITE_OTHER): Admitting: Family Medicine

## 2024-01-05 VITALS — BP 122/68 | HR 52 | Ht 63.0 in

## 2024-01-05 DIAGNOSIS — H65193 Other acute nonsuppurative otitis media, bilateral: Secondary | ICD-10-CM | POA: Diagnosis not present

## 2024-01-05 DIAGNOSIS — H6993 Unspecified Eustachian tube disorder, bilateral: Secondary | ICD-10-CM

## 2024-01-05 DIAGNOSIS — H9193 Unspecified hearing loss, bilateral: Secondary | ICD-10-CM

## 2024-01-05 MED ORDER — PREDNISONE 20 MG PO TABS: ORAL_TABLET | ORAL | 0 refills | Status: DC

## 2024-01-05 MED ORDER — APIXABAN 5 MG PO TABS: 5.0000 mg | ORAL_TABLET | Freq: Two times a day (BID) | ORAL | 11 refills | Status: DC

## 2024-01-05 NOTE — Patient Instructions (Addendum)
 Thank you for coming to the office today.  You have some Eustachian Tube Dysfunction, this problem is usually caused by some deeper sinus swelling and pressure, causing difficulty of eustachian tubes to clear fluid from behind ear drum. You can have ear pain, pressure, fullness, loss of hearing. Often related to sinus symptoms and sometimes with sinusitis or infection or allergy symptoms.  Treatment: - Keep using nasal steroid spray, Flonase 2 sprays in each nostril every day for at least 4-6 weeks, and maybe longer - Start OTC allergy medicine (Claritin, Zyrtec, or Allegra - or generics) once daily - For significant ear/sinus pressure, try OTC decongestant such as Phenylephrine or similar medicines, included in DayQuil, take this for up to 1 week or less, no longer  1 more week of steroid course to help reduce swelling behind ears.  Referral to ENT, stay tuned for apt.  [facility] ENT [facility] [address] [place], [place] [address] Phone: [phone]  [facility] ENT [facility] [address]  [place], [place] [address] Ph: [phone]  Please schedule a Follow-up Appointment to: Return if symptoms worsen or fail to improve.  If you have any other questions or concerns, please feel free to call the office or send a message through MyChart. You may also schedule an earlier appointment if necessary.  Additionally, you may be receiving a survey about your experience at our office within a few days to 1 week by e-mail or mail. We value your feedback.  Domingo Friend, DO [facility], [place]

## 2024-01-05 NOTE — Progress Notes (Signed)
 Subjective:    Patient ID: [name], female    DOB: 08-09-1941, 83 y.o.   MRN: [id number]  [name] is a 83 y.o. female presenting on 01/05/2024 for Hearing Problem (Effusion, eustachian tube dysfunction)  Patient presents for a same day appointment.  PCP Nicki Reaper, FNP   HPI  Discussed the use of AI scribe software for clinical note transcription with the patient, who gave verbal consent to proceed.  History of Present Illness   [name] is an 83 year old female who presents with persistent ear fullness and hearing loss. She also has history of dementia, here with family.  Last visit with PCP 12/18/23 for ear effusion / sinusitis. She has experienced ear fullness and hearing loss for the past week despite treatment with antibiotics and steroids prescribed on March 28th for sinus swelling and fluid. Her ears feel full with an echo, indicating persistent pressure and fluid behind the eardrums.  She has a history of ear infections and prior ear tube placement, which may have left scar tissue on the eardrums. No wax or blockage in the ears and no current infection. She has tried [employer] and Sudafed without relief.  She also takes Zyrtec for allergies, which she believes may have triggered the current ear issues. No nasal or ear drainage.  She is on Eliquis 5 mg, two tablets daily, for atrial fibrillation, with only 25 tablets remaining and 23 days until her next appointment. She is concerned about running out of medication before her next visit.           01/01/2024   11:52 AM 09/30/2023    8:30 AM 03/30/2023    9:15 AM  Depression screen PHQ 2/9  Decreased Interest 0 1 1  Down, Depressed, Hopeless 0 2 1  PHQ - 2 Score 0 3 2  Altered sleeping 0 1 0  Tired, decreased energy 0 2 3  Change in appetite 0 0 0  Feeling bad or failure about yourself  0 0 1  Trouble concentrating 0 0 0  Moving slowly or fidgety/restless 0 2 2  Suicidal thoughts 0 0 0  PHQ-9  Score 0 8 8  Difficult doing work/chores Not difficult at all Somewhat difficult Not difficult at all       09/30/2023    8:30 AM 03/30/2023    9:15 AM 02/05/2023    9:12 AM 12/01/2022    9:38 AM  GAD 7 : Generalized Anxiety Score  Nervous, Anxious, on Edge 0 1 1 0  Control/stop worrying 2 0 2 2  Worry too much - different things 1 1 1 2   Trouble relaxing 0 3 1 1   Restless 0 2 2 0  Easily annoyed or irritable 1 2 2 2   Afraid - awful might happen 0 1 0 1  Total GAD 7 Score 4 10 9 8   Anxiety Difficulty Somewhat difficult  Somewhat difficult Somewhat difficult    Social History   Tobacco Use   Smoking status: Every Day    Current packs/day: 0.50    Average packs/day: 0.5 packs/day for 40.0 years (20.0 ttl pk-yrs)    Types: Cigarettes   Smokeless tobacco: Never  Vaping Use   Vaping status: Never Used  Substance Use Topics   Alcohol use: No   Drug use: No    Review of Systems  HENT:  Positive for hearing loss.   Neurological:        Memory loss   Per HPI unless  specifically indicated above     Objective:    BP 122/68 (BP Location: Left Arm, Patient Position: Sitting, Cuff Size: Normal)   Pulse (!) 52   Ht 5\' 3"  (1.6 m)   LMP  (LMP Unknown)   SpO2 98%   BMI 26.39 kg/m   Wt Readings from Last 3 Encounters:  01/01/24 149 lb (67.6 kg)  12/18/23 149 lb (67.6 kg)  12/11/23 149 lb (67.6 kg)    Physical Exam Vitals and nursing note reviewed.  Constitutional:      General: She is not in acute distress.    Appearance: Normal appearance. She is well-developed. She is not diaphoretic.     Comments: Well-appearing elderly 83 yr female, comfortable, cooperative  HENT:     Head: Normocephalic and atraumatic.     Right Ear: Ear canal and external ear normal. There is no impacted cerumen.     Left Ear: Ear canal and external ear normal. There is no impacted cerumen.     Ears:     Comments: Abnormal TM anatomy and effusion bulging. Evidence of prior scar tissue on TM. No  cerumen and no erythema or purulence. Eyes:     General:        Right eye: No discharge.        Left eye: No discharge.     Conjunctiva/sclera: Conjunctivae normal.  Cardiovascular:     Rate and Rhythm: Normal rate.  Pulmonary:     Effort: Pulmonary effort is normal.  Skin:    General: Skin is warm and dry.     Findings: No erythema or rash.  Neurological:     Mental Status: She is alert and oriented to person, place, and time.  Psychiatric:        Mood and Affect: Mood normal.        Behavior: Behavior normal.        Thought Content: Thought content normal.     Comments: Well groomed, good eye contact, normal speech and thoughts     Results for orders placed or performed in visit on 10/30/23  CBC   Collection Time: 10/30/23 11:11 AM  Result Value Ref Range   WBC 4.9 3.4 - 10.8 x10E3/uL   RBC 4.08 3.77 - 5.28 x10E6/uL   Hemoglobin 13.2 11.1 - 15.9 g/dL   Hematocrit 16.1 09.6 - 46.6 %   MCV 99 (H) 79 - 97 fL   MCH 32.4 26.6 - 33.0 pg   MCHC 32.6 31.5 - 35.7 g/dL   RDW 04.5 40.9 - 81.1 %   Platelets 124 (L) 150 - 450 x10E3/uL  Basic metabolic panel   Collection Time: 10/30/23 11:11 AM  Result Value Ref Range   Glucose 95 70 - 99 mg/dL   BUN 13 8 - 27 mg/dL   Creatinine, Ser 9.14 0.57 - 1.00 mg/dL   eGFR 72 >78 GN/FAO/1.30   BUN/Creatinine Ratio 16 12 - 28   Sodium 141 134 - 144 mmol/L   Potassium 3.7 3.5 - 5.2 mmol/L   Chloride 105 96 - 106 mmol/L   CO2 22 20 - 29 mmol/L   Calcium 9.8 8.7 - 10.3 mg/dL  TSH   Collection Time: 10/30/23 11:11 AM  Result Value Ref Range   TSH 10.400 (H) 0.450 - 4.500 uIU/mL      Assessment & Plan:   Problem List Items Addressed This Visit   None Visit Diagnoses       ETD (Eustachian tube dysfunction), bilateral    -  Primary   Relevant Medications   predniSONE (DELTASONE) 20 MG tablet   Other Relevant Orders   Ambulatory referral to ENT     Bilateral hearing loss, unspecified hearing loss type       Relevant Orders    Ambulatory referral to ENT     Acute middle ear effusion, bilateral       Relevant Medications   predniSONE (DELTASONE) 20 MG tablet   Other Relevant Orders   Ambulatory referral to ENT        Hearing Loss / Eustachian Tube Dysfunction Persistent bilateral ear fullness and hearing loss due to fluid accumulation. Previous antibiotics and steroids ineffective. Possible contributing factors include age-related changes and scar tissue from prior ear tube placement. Differential includes sinus infection or allergies.  No further evidence of active sinusitis infection or AOM ear infection Recently treated already with antibiotics and steroid  Repeat taper prednisone given effusion still Refer to ENT for further evaluation and management (as noted on prior visit with PCP recommended ENT next if not improved, will proceed w/ this referral today)  Atrial Fibrillation Atrial fibrillation managed with Eliquis. Concerns about running out of medication before next cardiologist appointment.  Epic secure chat message Ronald Cockayne NP Cardiology for refill on Eliquis, will run out before next apt in May  Note they have reviewed message and sent refill already  Orders Placed This Encounter  Procedures   Ambulatory referral to ENT    Referral Priority:   Routine    Referral Type:   Consultation    Referral Reason:   Specialty Services Required    Requested Specialty:   Otolaryngology    Number of Visits Requested:   1    Meds ordered this encounter  Medications   DISCONTD: predniSONE (DELTASONE) 20 MG tablet    Sig: Take daily with food. Start with 60mg  (3 pills) x 2 days, then reduce to 40mg  (2 pills) x 2 days, then 20mg  (1 pill) x 3 days    Dispense:  13 tablet    Refill:  0   predniSONE (DELTASONE) 20 MG tablet    Sig: Take daily with food. Start with 60mg  (3 pills) x 2 days, then reduce to 40mg  (2 pills) x 2 days, then 20mg  (1 pill) x 3 days    Dispense:  13 tablet    Refill:  0     Follow up plan: Return if symptoms worsen or fail to improve.   Domingo Friend, DO [facility] 01/05/2024, 3:04 PM

## 2024-01-05 NOTE — Addendum Note (Signed)
 Addended by: Raina Bunting on: 01/05/2024 05:51 PM   Modules accepted: Level of Service

## 2024-01-07 ENCOUNTER — Other Ambulatory Visit: Payer: Self-pay

## 2024-01-07 ENCOUNTER — Telehealth: Payer: Self-pay

## 2024-01-08 NOTE — Patient Outreach (Signed)
   Name: [name] MRN: [id number] DOB: [birth year]  An unsuccessful outreach attempt was made today for a scheduled Care Management visit.   Follow Up Plan:  A member of the care team will make additional outreach attempts. Message relayed to Care Guide to reschedule    Roxie Cord [facility] RN Care Manager Direct Dial: [phone]  Fax: [phone] Website: [email or web address]

## 2024-01-13 ENCOUNTER — Encounter: Payer: Self-pay | Admitting: Cardiology

## 2024-01-13 ENCOUNTER — Ambulatory Visit: Attending: Cardiology | Admitting: Cardiology

## 2024-01-13 VITALS — BP 140/60 | HR 52 | Ht 60.0 in | Wt 144.0 lb

## 2024-01-13 DIAGNOSIS — E782 Mixed hyperlipidemia: Secondary | ICD-10-CM

## 2024-01-13 DIAGNOSIS — I10 Essential (primary) hypertension: Secondary | ICD-10-CM

## 2024-01-13 DIAGNOSIS — H9193 Unspecified hearing loss, bilateral: Secondary | ICD-10-CM | POA: Diagnosis not present

## 2024-01-13 DIAGNOSIS — I480 Paroxysmal atrial fibrillation: Secondary | ICD-10-CM

## 2024-01-13 DIAGNOSIS — H9313 Tinnitus, bilateral: Secondary | ICD-10-CM

## 2024-01-13 MED ORDER — APIXABAN 5 MG PO TABS: 5.0000 mg | ORAL_TABLET | Freq: Two times a day (BID) | ORAL | 0 refills | Status: DC

## 2024-01-13 MED ORDER — APIXABAN 5 MG PO TABS: 5.0000 mg | ORAL_TABLET | Freq: Two times a day (BID) | ORAL | 11 refills | Status: AC

## 2024-01-13 MED ORDER — APIXABAN 5 MG PO TABS: 5.0000 mg | ORAL_TABLET | Freq: Two times a day (BID) | ORAL | 11 refills | Status: DC

## 2024-01-13 NOTE — Progress Notes (Signed)
 Cardiology Office Note:  .   Date:  01/13/2024  ID:  [name], DOB 05/03/1941, MRN [id number] PCP: Carollynn Cirri, NP  [facility] Providers Cardiologist:  Belva Boyden, MD    History of Present Illness: .   [name] is a 83 y.o. female with a past medical history of hypertension, hyperlipidemia, hypothyroidism, obstructive sleep apnea not on CPAP, anemia, anxiety, arthritis, status post left hip surgery, postoperative atrial fibrillation, is here today to follow-up on her paroxysmal atrial fibrillation.   Ms. Affinito arrived to the [facility] emergency department via EMS on 10/03/23 after she fell in the [employer] parking lot.  She was able to get into her car with help and drove home.  EMS picked her up at home as she could not get out of the car.  She was complaining of left leg pain primarily in the left knee.  She states that she did not pass out and remembers the fall.  She thinks she just tripped or slipped on black ice.  She denies hitting her head or any other injuries she denied pain anywhere else.    On arrival to the emergency department vital signs were stable.  Blood pressure was noted at 152/74, heart rate of 87, respirations of 14, temperature 98.6, SpO2 96% on room air.  She was awake alert and oriented.  She did good peripheral perfusion.  Pertinent labs revealed a serum sodium 138, potassium 3.4, chloride 104, bicarb 23, BUN 15, serum creatinine 0.59, with a EGFR greater than 60, nonfasting blood glucose 99, WBCs of 11.7, platelets of 130, hemoglobin of 15.8. EKG reveals sinus rhythm with a rate of 92 with no ST elevations.  X-ray of the left hip revealed an acute nondisplaced intertronchanteric left femur fracture.  She was treated with fentanyl .  She was neurovascularly intact.  Hospitalist was called for admission and orthopedics was called.  On 10/04/2023 she underwent surgery with orthopedics for left hip fracture.  Patient did not require further testing  prior to surgery.  She had no history of heart failure or history of atrial fibrillation.  Postoperatively patient went into atrial fibrillation with RVR in the PACU.  She was given IV metoprolol .  Was unable to be started on Cardizem infusion due to soft blood pressures. She did not appear to be in any acute distress.  Telemetry showed atrial fibrillation 120-130 bpm.  Blood pressure was slightly soft after 5 mg of IV metoprolol  which continued to remain asymptomatic.  She was started on amiodarone  infusion.  Unfortunately cannot give anticoagulation due to recent surgery.Anticoagulation was to be reconsidered in the morning if okay with orthopedics.  She underwent successful DCCV on 10/03/2023 and remained in sinus rhythm since that time.  IV amiodarone  was transitioned to oral amiodarone  with the escalating doses.  She was transition from heparin  drip to apixaban  5 mg twice daily she did not meet reduced dosing criteria.  Echocardiogram revealed an LVEF of 50-55%, mild LVH, mild MR, mild to moderate TR, and aortic valve sclerosis without stenosis.  Her metoprolol  to tartrate was transitioned to Toprol -XL.  She was considered stable and discharged to SNF in 10/15/2023.   She was last seen in clinic 10/30/2023 stating that she has been doing well since discharge from the hospital in February 23.  According to her husband she should be returning home from rehab on the upcoming Wednesday.  She was continued on her current medication dosing and was sent for an updated labs.  She returns to clinic today    ROS: 10 point review of system has been reviewed and considered negative exception was been listed in the HPI  Studies Reviewed: .        2D echo 10/05/2023 1. Left ventricular ejection fraction, by estimation, is 50 to 55%. The  left ventricle has low normal function. Left ventricular endocardial  border not optimally defined to evaluate regional wall motion. There is  mild left ventricular hypertrophy.  Left  ventricular diastolic parameters are indeterminate.   2. Right ventricular systolic function is normal. The right ventricular  size is normal. There is normal pulmonary artery systolic pressure.   3. Left atrial size was severely dilated.   4. The mitral valve is normal in structure. Mild mitral valve  regurgitation. No evidence of mitral stenosis.   5. Tricuspid valve regurgitation is mild to moderate.   6. 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 Aortic valve regurgitation is not  visualized. Aortic valve sclerosis/calcification is present, without any  evidence of aortic stenosis.   7. The inferior vena cava is normal in size with greater than 50%  respiratory variability, suggesting right atrial pressure of 3 mmHg.  Risk Assessment/Calculations:    CHA2DS2-VASc Score = 4   This indicates a 4.8% annual risk of stroke. The patient's score is based upon: CHF History: 0 HTN History: 1 Diabetes History: 0 Stroke History: 0 Vascular Disease History: 0 Age Score: 2 Gender Score: 1         Physical Exam:   VS:  BP (!) 140/60 (BP Location: Left Arm, Patient Position: Sitting, Cuff Size: Normal)   Pulse (!) 52   Ht 5' (1.524 m)   Wt 144 lb (65.3 kg)   LMP  (LMP Unknown)   SpO2 98%   BMI 28.12 kg/m    Wt Readings from Last 3 Encounters:  01/13/24 144 lb (65.3 kg)  01/01/24 149 lb (67.6 kg)  12/18/23 149 lb (67.6 kg)    GEN: Well nourished, well developed in no acute distress NECK: No JVD; No carotid bruits CARDIAC: RRR, bradycardic, no murmurs, rubs, gallops RESPIRATORY:  Clear to auscultation without rales, wheezing or rhonchi  ABDOMEN: Soft, non-tender, non-distended EXTREMITIES: Trace pretibial edema; No deformity   ASSESSMENT AND PLAN: .   Paroxysmal atrial fibrillation patient continues to maintain sinus rhythm today after undergoing successful cardioversion and TEE during hospitalization.  She was placed on amiodarone  200 mg daily and apixaban  5 mg twice daily for  CHA2DS2-VASc score of at least 4 if she does not meet reduced dosing criteria.  She is also continued on Toprol -XL 50 mg daily.  Follow-up labs were completed at her last visit.  There is some confusion today over what happened with amiodarone .  Husband will look to ensure that she still does not have the medication.  She is also being given samples of apixaban  today.  We did discuss changing Toprol  due to her reduced heart rate but further review of telemetry during recent hospitalization showed bradycardia during hospitalization where she is asymptomatic today.  No further medication changes were made.  Primary hypertension with blood pressure today 140/60.  She is continued on amlodipine  10 mg daily and Toprol -XL 50 mg daily.  She has been encouraged to monitor her heart rate 1 to 2 hours postmedication administration.  Mixed hyperlipidemia with last LDL of 122.  She is continued on atorvastatin  40 mg daily will need an updated lipid and hepatic panel on return.  Tinnitus/and bilateral hearing loss  when she was recently treated with steroids and antibiotics from her PCP and has an upcoming appointment as she has been referred to ENT.       Dispo: Patient to return to clinic to see MD/APP in 3 months or sooner if needed.  Husband follows with Dr. Gollan and prefers wife to follow-up with Dr. Gollan as well.  Signed, Daissy Yerian, NP

## 2024-01-13 NOTE — Patient Instructions (Addendum)
 Medication Instructions:  Your physician recommends the following medication changes.   START TAKING: Eliquis  5 MG Daily  *If you need a refill on your cardiac medications before your next appointment, please call your pharmacy*  Lab Work: No labs ordered today  If you have labs (blood work) drawn today and your tests are completely normal, you will receive your results only by: MyChart Message (if you have MyChart) OR A paper copy in the mail If you have any lab test that is abnormal or we need to change your treatment, we will call you to review the results.  Testing/Procedures: No test ordered today   Follow-Up: At [facility], you and your health needs are our priority.  As part of our continuing mission to provide you with exceptional heart care, our providers are all part of one team.  This team includes your primary Cardiologist (physician) and Advanced Practice Providers or APPs (Physician Assistants and Nurse Practitioners) who all work together to provide you with the care you need, when you need it.  Your next appointment:   3 month(s)  Provider:   You may see Timothy Gollan, MD or one of the following Advanced Practice Providers on your designated Care Team:   Laneta Pintos, NP Gildardo Labrador, PA-C Varney Gentleman, PA-C Cadence Plummer, PA-C Ronald Cockayne, NP Morey Ar, NP

## 2024-01-19 DIAGNOSIS — H6981 Other specified disorders of Eustachian tube, right ear: Secondary | ICD-10-CM | POA: Diagnosis not present

## 2024-01-19 DIAGNOSIS — H903 Sensorineural hearing loss, bilateral: Secondary | ICD-10-CM | POA: Diagnosis not present

## 2024-01-28 ENCOUNTER — Ambulatory Visit: Payer: PPO | Admitting: Cardiology

## 2024-02-03 ENCOUNTER — Telehealth: Payer: Self-pay

## 2024-02-03 NOTE — Progress Notes (Signed)
 Complex Care Management Care Guide Note  02/03/2024 Name: [name] MRN: [id number] DOB: 1941-08-21  [name] is a 83 y.o. year old female who is a primary care patient of Carollynn Cirri, NP and is actively engaged with the care management team. I reached out to Perrin Brakeman by phone today to assist with re-scheduling  with the RN Case Manager.  Follow up plan: Unsuccessful telephone outreach attempt made. A HIPAA compliant phone message was left for the patient providing contact information and requesting a return call.  Lenton Rail , RMA     [facility]  [facility] Guide  Direct Dial: [phone]  Website: [email or web address]

## 2024-02-09 DIAGNOSIS — H903 Sensorineural hearing loss, bilateral: Secondary | ICD-10-CM | POA: Diagnosis not present

## 2024-02-18 ENCOUNTER — Telehealth: Payer: Self-pay

## 2024-02-18 NOTE — Telephone Encounter (Signed)
 Attempted to reach patient to check in and she how she was doing after her fall. No answer, unable to leave VM. Okay to check in on patient if she returns call.

## 2024-02-18 NOTE — Telephone Encounter (Signed)
 Copied from CRM [id number]. Topic: General - Other >> Feb 18, 2024 11:36 AM Felizardo Hotter wrote: Reason for CRM: Received call from [facility] per Gwendlyn Lemmings ph: [phone] stated pt fell in living room on 02/17/2024 no injuries.

## 2024-02-26 ENCOUNTER — Other Ambulatory Visit: Payer: Self-pay

## 2024-02-28 ENCOUNTER — Other Ambulatory Visit: Payer: Self-pay | Admitting: Internal Medicine

## 2024-02-28 DIAGNOSIS — I10 Essential (primary) hypertension: Secondary | ICD-10-CM

## 2024-02-28 DIAGNOSIS — J029 Acute pharyngitis, unspecified: Secondary | ICD-10-CM

## 2024-02-29 ENCOUNTER — Telehealth: Payer: Self-pay

## 2024-02-29 NOTE — Telephone Encounter (Signed)
 Copied from CRM [id number]. Topic: Referral - Request for Referral >> Feb 29, 2024  9:33 AM Kevelyn M wrote: Did the patient discuss referral with their provider in the last year? Yes, patient's physical therapist suggested the patient see a neurologist because she has all of the signs of parkinsons  Type of order/referral and detailed reason for visit: To see a neurologist  Preference of office, provider, location: [place] [place] or [place]  If referral order, have you been seen by this specialty before? No (If Yes, this issue or another issue? When? Where?  Can we respond through MyChart? No   Please call Porfirio [facility] #[phone]

## 2024-02-29 NOTE — Telephone Encounter (Signed)
 We have an appointment in a few weeks.  We can discuss referral to neurology at that time

## 2024-03-01 NOTE — Telephone Encounter (Signed)
 Requested Prescriptions  Pending Prescriptions Disp Refills   amLODipine  (NORVASC ) 10 MG tablet [Pharmacy Med Name: AMLODIPINE  BESYLATE 10MG TABLETS] 90 tablet 0    Sig: TAKE 1 TABLET(10 MG) BY MOUTH DAILY     Cardiovascular: Calcium  Channel Blockers 2 Failed - 03/01/2024  9:04 AM      Failed - Last BP in normal range    BP Readings from Last 1 Encounters:  01/13/24 (!) 140/60         Passed - Last Heart Rate in normal range    Pulse Readings from Last 1 Encounters:  01/13/24 (!) 52         Passed - Valid encounter within last 6 months    Recent Outpatient Visits           1 month ago ETD (Eustachian tube dysfunction), bilateral   [place] [facility] [place], Kayleen Party, DO   2 months ago ETD (Eustachian tube dysfunction), left   [place] [facility] [place], Rankin Buzzard, NP   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 [facility])   [place] [facility] [place], [place] W, NP   3 months ago Closed fracture of left hip with routine healing, subsequent encounter   [place] [facility] [place], Rankin Buzzard, NP       Future Appointments             In 4 weeks Thalia Filler, Rankin Buzzard, NP [place] [facility]   In 1 month Jerelene Monday, Deadra Everts, MD [facility]             metoprolol  succinate (TOPROL -XL) 50 MG 24 hr tablet [Pharmacy Med Name: METOPROLOL  ER SUCCINATE 50MG  TABS] 90 tablet 0    Sig: TAKE 1 TABLET BY MOUTH EVERY DAY WITH OR IMMEDIATELY FOLLOWING A MEAL     Cardiovascular:  Beta Blockers Failed - 03/01/2024  9:04 AM      Failed - Last BP in normal range    BP Readings from Last 1 Encounters:  01/13/24 (!) 140/60         Passed - Last Heart Rate in normal range    Pulse Readings from Last 1 Encounters:  01/13/24 (!) 52         Passed - Valid encounter within last 6 months    Recent Outpatient Visits           1 month ago ETD (Eustachian tube  dysfunction), bilateral   [place] 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

## 2024-03-07 ENCOUNTER — Other Ambulatory Visit: Payer: Self-pay | Admitting: Internal Medicine

## 2024-03-09 NOTE — Telephone Encounter (Signed)
 Requested Prescriptions  Pending Prescriptions Disp Refills   atorvastatin  (LIPITOR) 40 MG tablet [Pharmacy Med Name: ATORVASTATIN  40MG  TABLETS] 90 tablet 2    Sig: TAKE 1 TABLET(40 MG) BY MOUTH DAILY     Cardiovascular:  Antilipid - Statins Failed - 03/09/2024  4:51 PM      Failed - Lipid Panel in normal range within the last 12 months    Cholesterol, Total  Date Value Ref Range Status  12/21/2017 181 100 - 199 mg/dL Final   Cholesterol  Date Value Ref Range Status  09/30/2023 202 (H) <200 mg/dL Final   LDL Cholesterol (Calc)  Date Value Ref Range Status  09/30/2023 122 (H) mg/dL (calc) Final    Comment:    Reference range: <100 . Desirable range <100 mg/dL for primary prevention;   <70 mg/dL for patients with CHD or diabetic patients  with > or = 2 CHD risk factors. Aaron Aas LDL-C is now calculated using the Martin-Hopkins  calculation, which is a validated novel method providing  better accuracy than the Friedewald equation in the  estimation of LDL-C.  Melinda Sprawls et al. Erroll Heard. 1610;960(45): 2061-2068  (http://education.[facility].com/faq/FAQ164)    HDL  Date Value Ref Range Status  09/30/2023 55 > OR = 50 mg/dL Final  40/98/1191 50 >47 mg/dL Final   Triglycerides  Date Value Ref Range Status  09/30/2023 136 <150 mg/dL Final         Passed - Patient is not pregnant      Passed - Valid encounter within last 12 months    Recent Outpatient Visits           2 months ago ETD (Eustachian tube dysfunction), bilateral   [facility] [place], Kayleen Party, DO   2 months ago ETD (Eustachian tube dysfunction), left   [facility] [place], Rankin Buzzard, NP   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

## 2024-03-24 ENCOUNTER — Encounter: Admitting: Internal Medicine

## 2024-03-28 ENCOUNTER — Other Ambulatory Visit: Payer: Self-pay | Admitting: Internal Medicine

## 2024-03-28 DIAGNOSIS — F5104 Psychophysiologic insomnia: Secondary | ICD-10-CM

## 2024-03-30 ENCOUNTER — Ambulatory Visit: Payer: Self-pay | Admitting: Internal Medicine

## 2024-03-30 NOTE — Telephone Encounter (Signed)
 Requested Prescriptions  Pending Prescriptions Disp Refills   traZODone  (DESYREL ) 50 MG tablet [Pharmacy Med Name: TRAZODONE  50MG  TABLETS] 90 tablet 0    Sig: TAKE 1 TABLET(50 MG) BY MOUTH AT BEDTIME AS NEEDED FOR SLEEP     Psychiatry: Antidepressants - Serotonin Modulator Passed - 03/30/2024 10:53 AM      Passed - Valid encounter within last 6 months    Recent Outpatient Visits           2 months ago ETD (Eustachian tube dysfunction), bilateral   [place] [facility] [place], Marsa PARAS, DO   3 months ago ETD (Eustachian tube dysfunction), left   [facility] [place], Angeline ORN, NP   3 months ago Mixed Alzheimer and vascular dementia [facility] [place])   [place] [facility] [place], [place] W, NP   4 months ago Closed fracture of left hip with routine healing, subsequent encounter   [place] [facility] [place], Angeline ORN, NP       Future Appointments             In 2 weeks Perla, Timothy J, MD [facility]

## 2024-04-13 ENCOUNTER — Encounter: Payer: Self-pay | Admitting: Internal Medicine

## 2024-04-13 ENCOUNTER — Ambulatory Visit (INDEPENDENT_AMBULATORY_CARE_PROVIDER_SITE_OTHER): Admitting: Internal Medicine

## 2024-04-13 VITALS — BP 92/54 | Ht 60.0 in | Wt 144.0 lb

## 2024-04-13 DIAGNOSIS — R739 Hyperglycemia, unspecified: Secondary | ICD-10-CM

## 2024-04-13 DIAGNOSIS — Z0001 Encounter for general adult medical examination with abnormal findings: Secondary | ICD-10-CM

## 2024-04-13 DIAGNOSIS — Z6828 Body mass index (BMI) 28.0-28.9, adult: Secondary | ICD-10-CM | POA: Diagnosis not present

## 2024-04-13 DIAGNOSIS — E039 Hypothyroidism, unspecified: Secondary | ICD-10-CM

## 2024-04-13 DIAGNOSIS — E782 Mixed hyperlipidemia: Secondary | ICD-10-CM

## 2024-04-13 DIAGNOSIS — I10 Essential (primary) hypertension: Secondary | ICD-10-CM

## 2024-04-13 DIAGNOSIS — E663 Overweight: Secondary | ICD-10-CM

## 2024-04-13 LAB — LIPID PANEL
Cholesterol: 167 mg/dL (ref <200)
HDL: 54 mg/dL (ref >=50)
LDL Cholesterol (Calc): 86 mg/dL
Non-HDL Cholesterol (Calc): 113 mg/dL (ref <130)
Total CHOL/HDL Ratio: 3.1 (calc) (ref <5.0)
Triglycerides: 168 mg/dL — ABNORMAL HIGH (ref <150)

## 2024-04-13 LAB — CBC
HCT: 43.3 % (ref 35.0–45.0)
Hemoglobin: 14 g/dL (ref 11.7–15.5)
MCH: 32 pg (ref 27.0–33.0)
MCHC: 32.3 g/dL (ref 32.0–36.0)
MCV: 99.1 fL (ref 80.0–100.0)
MPV: 12.8 fL — ABNORMAL HIGH (ref 7.5–12.5)
Platelets: 116 Thousand/uL — ABNORMAL LOW (ref 140–400)
RBC: 4.37 Million/uL (ref 3.80–5.10)
RDW: 12.8 % (ref 11.0–15.0)
WBC: 6.2 Thousand/uL (ref 3.8–10.8)

## 2024-04-13 LAB — COMPREHENSIVE METABOLIC PANEL WITH GFR
AG Ratio: 1.9 (calc) (ref 1.0–2.5)
ALT: 10 U/L (ref 6–29)
AST: 17 U/L (ref 10–35)
Albumin: 4.4 g/dL (ref 3.6–5.1)
Alkaline phosphatase (APISO): 62 U/L (ref 37–153)
BUN: 15 mg/dL (ref 7–25)
CO2: 25 mmol/L (ref 20–32)
Calcium: 10 mg/dL (ref 8.6–10.4)
Chloride: 108 mmol/L (ref 98–110)
Creat: 0.83 mg/dL (ref 0.60–0.95)
Globulin: 2.3 g/dL (ref 1.9–3.7)
Glucose, Bld: 91 mg/dL (ref 65–99)
Potassium: 4 mmol/L (ref 3.5–5.3)
Sodium: 141 mmol/L (ref 135–146)
Total Bilirubin: 0.9 mg/dL (ref 0.2–1.2)
Total Protein: 6.7 g/dL (ref 6.1–8.1)
eGFR: 71 mL/min/1.73m2 (ref >=60)

## 2024-04-13 LAB — HEMOGLOBIN A1C
Hgb A1c MFr Bld: 5.4 % (ref <5.7)
Mean Plasma Glucose: 108 mg/dL
eAG (mmol/L): 6 mmol/L

## 2024-04-13 LAB — TSH: TSH: 0.27 m[IU]/L — ABNORMAL LOW (ref 0.40–4.50)

## 2024-04-13 LAB — T4, FREE: Free T4: 1.5 ng/dL (ref 0.8–1.8)

## 2024-04-13 NOTE — Assessment & Plan Note (Signed)
 Encourage diet and exercise for weight loss

## 2024-04-13 NOTE — Progress Notes (Signed)
 Subjective:    Patient ID: [name], female    DOB: 17-May-1941, 83 y.o.   MRN: [id number]  HPI  Patient presents to clinic today for her annual exam. Of note, her BP today is 92/54. She is taking amlodiipine, amiodarone  and metoprolol  as prescribed.  Flu: 07/2023 Tetanus: 07/2023 COVID: Pfizer x 4 Pneumovax: 05/2020 Prevnar 20: 07/2023 Shingrix: Never Pap smear: Hysterectomy Mammogram: 01/2023 Bone density: 12/2021 Colon screening: 04/2021 Vision screening: as needed Dentist: dentures  Diet: She does eat meat. She consumes fruits and veggies. She does eat fried foods. She drinks mostly water, dt pepsi and tea. Exercise: None  Review of Systems     Past Medical History:  Diagnosis Date   Allergy    Anemia    Anxiety    Arthritis    Cancer (HCC)    skin cancer   Hyperlipidemia    Hypertension    Hypothyroidism    Sciatica    Sleep apnea    does not use cpap   Thrombocytopenia (HCC)    Thyroid  disease     Current Outpatient Medications  Medication Sig Dispense Refill   acetaminophen  (TYLENOL ) 500 MG tablet Take 500 mg by mouth every 6 (six) hours as needed for moderate pain.      amiodarone  (PACERONE ) 200 MG tablet Take 1 tablet (200 mg total) by mouth daily. 30 tablet 0   amLODipine  (NORVASC ) 10 MG tablet TAKE 1 TABLET(10 MG) BY MOUTH DAILY 90 tablet 0   amoxicillin -clavulanate (AUGMENTIN ) 875-125 MG tablet Take 1 tablet by mouth 2 (two) times daily. 20 tablet 0   apixaban  (ELIQUIS ) 5 MG TABS tablet Take 1 tablet (5 mg total) by mouth 2 (two) times daily. 28 tablet 0   apixaban  (ELIQUIS ) 5 MG TABS tablet Take 1 tablet (5 mg total) by mouth 2 (two) times daily. 60 tablet 11   atorvastatin  (LIPITOR) 40 MG tablet TAKE 1 TABLET(40 MG) BY MOUTH DAILY 90 tablet 2   cetirizine  (ZYRTEC ) 10 MG tablet Take 10 mg by mouth daily.     citalopram  (CELEXA ) 20 MG tablet TAKE 1 TABLET(20 MG) BY MOUTH DAILY 90 tablet 1   donepezil  (ARICEPT  ODT) 5 MG disintegrating tablet  Take 1 tablet (5 mg total) by mouth at bedtime. 90 tablet 1   HYDROcodone -acetaminophen  (NORCO/VICODIN) 5-325 MG tablet Take 1 tablet by mouth every 6 (six) hours as needed for severe pain (pain score 7-10). (Patient not taking: Reported on 01/13/2024) 30 tablet 0   levothyroxine  (SYNTHROID ) 150 MCG tablet TAKE 1 TABLET(150 MCG) BY MOUTH DAILY 90 tablet 1   metoprolol  succinate (TOPROL -XL) 50 MG 24 hr tablet TAKE 1 TABLET BY MOUTH EVERY DAY WITH OR IMMEDIATELY FOLLOWING A MEAL 90 tablet 0   Multiple Vitamin (MULTIVITAMIN) capsule Take 1 capsule by mouth daily.     predniSONE  (DELTASONE ) 20 MG tablet Take daily with food. Start with 60mg  (3 pills) x 2 days, then reduce to 40mg  (2 pills) x 2 days, then 20mg  (1 pill) x 3 days (Patient not taking: Reported on 01/13/2024) 13 tablet 0   traZODone  (DESYREL ) 50 MG tablet TAKE 1 TABLET(50 MG) BY MOUTH AT BEDTIME AS NEEDED FOR SLEEP 90 tablet 0   No current facility-administered medications for this visit.    Allergies  Allergen Reactions   Codeine Nausea Only    Tolerates oxycodone    Morphine  Nausea And Vomiting   Ace Inhibitors Cough   Adhesive [Tape] Other (See Comments) and Rash    whelps  Family History  Problem Relation Age of Onset   Dementia Mother    Dementia Sister    Heart attack Brother    Diabetes Brother    Breast cancer Paternal Aunt    Heart attack Son    Hypercalcemia Neg Hx    Cancer Neg Hx     Social History   Socioeconomic History   Marital status: Married    Spouse name: ROBERT   Number of children: 4   Years of education: Not on file   Highest education level: GED or equivalent  Occupational History   Occupation: retired   Occupation: Retired  Tobacco Use   Smoking status: Every Day    Current packs/day: 0.50    Average packs/day: 0.5 packs/day for 40.0 years (20.0 ttl pk-yrs)    Types: Cigarettes   Smokeless tobacco: Never  Vaping Use   Vaping status: Never Used  Substance and Sexual Activity    Alcohol  use: No   Drug use: No   Sexual activity: Not Currently  Other Topics Concern   Not on file  Social History Narrative   Not on file   Social Drivers of Health   Financial Resource Strain: Low Risk  (04/12/2024)   Overall Financial Resource Strain (CARDIA)    Difficulty of Paying Living Expenses: Not very hard  Food Insecurity: Food Insecurity Present (04/12/2024)   Hunger Vital Sign    Worried About Running Out of Food in the Last Year: Sometimes true    Ran Out of Food in the Last Year: Never true  Transportation Needs: Unknown (04/12/2024)   PRAPARE - Administrator, Civil Service (Medical): No    Lack of Transportation (Non-Medical): Not on file  Physical Activity: Inactive (01/01/2024)   Exercise Vital Sign    Days of Exercise per Week: 0 days    Minutes of Exercise per Session: 30 min  Stress: No Stress Concern Present (04/12/2024)   [employer] of Occupational Health - Occupational Stress Questionnaire    Feeling of Stress: Only a little  Social Connections: Moderately Integrated (04/12/2024)   Social Connection and Isolation Panel    Frequency of Communication with Friends and Family: Never    Frequency of Social Gatherings with Friends and Family: Never    Attends Religious Services: More than 4 times per year    Active Member of [employer] or Organizations: Yes    Attends Banker Meetings: More than 4 times per year    Marital Status: Married  Catering manager Violence: Not At Risk (01/01/2024)   Humiliation, Afraid, Rape, and Kick questionnaire    Fear of Current or Ex-Partner: No    Emotionally Abused: No    Physically Abused: No    Sexually Abused: No     Constitutional: Patient reports fatigue.  Denies fever, malaise, headache or abrupt weight changes.  HEENT: Denies eye pain, eye redness, ear pain, ringing in the ears, wax buildup, runny nose, nasal congestion, bloody nose, or sore throat. Respiratory: Denies difficulty breathing,  shortness of breath, cough or sputum production.   Cardiovascular: Denies chest pain, chest tightness, palpitations or swelling in the hands or feet.  Gastrointestinal: Denies abdominal pain, bloating, constipation, diarrhea or blood in the stool.  GU: Denies urgency, frequency, pain with urination, burning sensation, blood in urine, odor or discharge. Musculoskeletal: Patient reports joint pain and difficulty with gait.  Denies decrease in range of motion, muscle pain or joint swelling.  Skin: Denies redness, rashes, lesions or ulcercations.  Neurological: Patient reports insomnia, restless legs, difficulty with memory, difficulty with balance, paresthesia of left leg.  Denies dizziness, difficulty with speech or problems with coordination.  Psych: Patient has a history of anxiety.  Denies depression, SI/HI.  No other specific complaints in a complete review of systems (except as listed in HPI above).  Objective:   Physical Exam BP (!) 92/54 (BP Location: Right Arm, Patient Position: Sitting, Cuff Size: Normal)   Ht 5' (1.524 m)   Wt 144 lb (65.3 kg)   LMP  (LMP Unknown)   BMI 28.12 kg/m   Wt Readings from Last 3 Encounters:  01/13/24 144 lb (65.3 kg)  01/01/24 149 lb (67.6 kg)  12/18/23 149 lb (67.6 kg)    General: Appears her stated age, overweight, chronically ill-appearing in NAD. Skin: Warm, dry and intact.  Spider veins noted to BLE. HEENT: Head: normal shape and size; Eyes: sclera white, no icterus, conjunctiva pink, PERRLA and EOMs intact;  Neck:  Neck supple, trachea midline. No masses, lumps or thyromegaly present.  Cardiovascular: Bradycardic with normal rhythm. S1,S2 noted.  No murmur, rubs or gallops noted. No JVD or BLE edema. No carotid bruits noted. Pulmonary/Chest: Normal effort and positive vesicular breath sounds. No respiratory distress. No wheezes, rales or ronchi noted.  Abdomen: Soft and nontender. Normal bowel sounds.  Musculoskeletal: No bony tenderness  noted over the lumbar spine.  Pain with palpation over the left SI joint.  In wheelchair today. Neurological: Alert and oriented.  She has obvious trouble with recall.  Repeats herself frequently.  Coordination normal but slow.  Psychiatric: Mood and affect normal. Behavior is normal. Judgment and thought content normal.     BMET    Component Value Date/Time   NA 141 10/30/2023 1111   NA 139 08/26/2012 2035   K 3.7 10/30/2023 1111   K 3.3 (L) 08/26/2012 2035   CL 105 10/30/2023 1111   CL 105 08/26/2012 2035   CO2 22 10/30/2023 1111   CO2 29 08/26/2012 2035   GLUCOSE 95 10/30/2023 1111   GLUCOSE 93 10/15/2023 0421   GLUCOSE 97 08/26/2012 2035   BUN 13 10/30/2023 1111   BUN 13 08/26/2012 2035   CREATININE 0.82 10/30/2023 1111   CREATININE 0.63 09/30/2023 0846   CALCIUM  9.8 10/30/2023 1111   CALCIUM  9.9 08/26/2012 2035   GFRNONAA >60 10/15/2023 0421   GFRNONAA 66 09/11/2020 1147   GFRAA 76 09/11/2020 1147    Lipid Panel     Component Value Date/Time   CHOL 202 (H) 09/30/2023 0846   CHOL 181 12/21/2017 1017   TRIG 136 09/30/2023 0846   HDL 55 09/30/2023 0846   HDL 50 12/21/2017 1017   CHOLHDL 3.7 09/30/2023 0846   LDLCALC 122 (H) 09/30/2023 0846    CBC    Component Value Date/Time   WBC 4.9 10/30/2023 1111   WBC 7.7 10/15/2023 0421   RBC 4.08 10/30/2023 1111   RBC 3.19 (L) 10/15/2023 0421   HGB 13.2 10/30/2023 1111   HCT 40.5 10/30/2023 1111   PLT 124 (L) 10/30/2023 1111   MCV 99 (H) 10/30/2023 1111   MCV 92 08/26/2012 2035   MCH 32.4 10/30/2023 1111   MCH 33.9 10/15/2023 0421   MCHC 32.6 10/30/2023 1111   MCHC 33.1 10/15/2023 0421   RDW 11.7 10/30/2023 1111   RDW 13.1 08/26/2012 2035   LYMPHSABS 1.8 10/14/2023 0438   LYMPHSABS 2.5 12/21/2017 1017   MONOABS 1.3 (H) 10/14/2023 0438   EOSABS 0.1 10/14/2023  0438   EOSABS 0.3 12/21/2017 1017   BASOSABS 0.1 10/14/2023 0438   BASOSABS 0.0 12/21/2017 1017    Hgb A1C Lab Results  Component Value Date    HGBA1C 5.3 09/30/2023           Assessment & Plan:   Preventative health maintenance:   Encouraged her to get a flu shot in the fall Tetanus UTD COVID-vaccine UTD Pneumovax and Prevnar UTD Discussed Shingrix vaccine, she will check coverage with her insurance company and schedule visit if she would like to have this done She no longer needs Pap smears She no longer wants to screen for breast cancer Bone density UTD She no longer wants to screen for colon cancer given her age Encouraged her to consume a balanced diet and exercise regimen Advised her to see an eye doctor and dentist annually We will check CBC, c-Met, TSH, Free T4, lipid, A1c today  RTC in 6 months, follow-up chronic conditions Angeline Laura, NP

## 2024-04-13 NOTE — Assessment & Plan Note (Signed)
 Will have her stop amlodipine  10 mg daily given hypotension Continue metoprolol  50 mg daily as previously prescribed

## 2024-04-13 NOTE — Patient Instructions (Signed)
 Health Maintenance for Postmenopausal Women Menopause is a normal process in which your ability to get pregnant comes to an end. This process happens slowly over many months or years, usually between the ages of 24 and 62. Menopause is complete when you have missed your menstrual period for 12 months. It is important to talk with your health care provider about some of the most common conditions that affect women after menopause (postmenopausal women). These include heart disease, cancer, and bone loss (osteoporosis). Adopting a healthy lifestyle and getting preventive care can help to promote your health and wellness. The actions you take can also lower your chances of developing some of these common conditions. What are the signs and symptoms of menopause? During menopause, you may have the following symptoms: Hot flashes. These can be moderate or severe. Night sweats. Decrease in sex drive. Mood swings. Headaches. Tiredness (fatigue). Irritability. Memory problems. Problems falling asleep or staying asleep. Talk with your health care provider about treatment options for your symptoms. Do I need hormone replacement therapy? Hormone replacement therapy is effective in treating symptoms that are caused by menopause, such as hot flashes and night sweats. Hormone replacement carries certain risks, especially as you become older. If you are thinking about using estrogen or estrogen with progestin, discuss the benefits and risks with your health care provider. How can I reduce my risk for heart disease and stroke? The risk of heart disease, heart attack, and stroke increases as you age. One of the causes may be a change in the body's hormones during menopause. This can affect how your body uses dietary fats, triglycerides, and cholesterol. Heart attack and stroke are medical emergencies. There are many things that you can do to help prevent heart disease and stroke. Watch your blood pressure High  blood pressure causes heart disease and increases the risk of stroke. This is more likely to develop in people who have high blood pressure readings or are overweight. Have your blood pressure checked: Every 3-5 years if you are 50-75 years of age. Every year if you are 77 years old or older. Eat a healthy diet  Eat a diet that includes plenty of vegetables, fruits, low-fat dairy products, and lean protein. Do not eat a lot of foods that are high in solid fats, added sugars, or sodium. Get regular exercise Get regular exercise. This is one of the most important things you can do for your health. Most adults should: Try to exercise for at least 150 minutes each week. The exercise should increase your heart rate and make you sweat (moderate-intensity exercise). Try to do strengthening exercises at least twice each week. Do these in addition to the moderate-intensity exercise. Spend less time sitting. Even light physical activity can be beneficial. Other tips Work with your health care provider to achieve or maintain a healthy weight. Do not use any products that contain nicotine or tobacco. These products include cigarettes, chewing tobacco, and vaping devices, such as e-cigarettes. If you need help quitting, ask your health care provider. Know your numbers. Ask your health care provider to check your cholesterol and your blood sugar (glucose). Continue to have your blood tested as directed by your health care provider. Do I need screening for cancer? Depending on your health history and family history, you may need to have cancer screenings at different stages of your life. This may include screening for: Breast cancer. Cervical cancer. Lung cancer. Colorectal cancer. What is my risk for osteoporosis? After menopause, you may be  at increased risk for osteoporosis. Osteoporosis is a condition in which bone destruction happens more quickly than new bone creation. To help prevent osteoporosis or  the bone fractures that can happen because of osteoporosis, you may take the following actions: If you are 61-3 years old, get at least 1,000 mg of calcium and at least 600 international units (IU) of vitamin D per day. If you are older than age 61 but younger than age 75, get at least 1,200 mg of calcium and at least 600 international units (IU) of vitamin D per day. If you are older than age 62, get at least 1,200 mg of calcium and at least 800 international units (IU) of vitamin D per day. Smoking and drinking excessive alcohol increase the risk of osteoporosis. Eat foods that are rich in calcium and vitamin D, and do weight-bearing exercises several times each week as directed by your health care provider. How does menopause affect my mental health? Depression may occur at any age, but it is more common as you become older. Common symptoms of depression include: Feeling depressed. Changes in sleep patterns. Changes in appetite or eating patterns. Feeling an overall lack of motivation or enjoyment of activities that you previously enjoyed. Frequent crying spells. Talk with your health care provider if you think that you are experiencing any of these symptoms. General instructions See your health care provider for regular wellness exams and vaccines. This may include: Scheduling regular health, dental, and eye exams. Getting and maintaining your vaccines. These include: Influenza vaccine. Get this vaccine each year before the flu season begins. Pneumonia vaccine. Shingles vaccine. Tetanus, diphtheria, and pertussis (Tdap) booster vaccine. Your health care provider may also recommend other immunizations. Tell your health care provider if you have ever been abused or do not feel safe at home. Summary Menopause is a normal process in which your ability to get pregnant comes to an end. This condition causes hot flashes, night sweats, decreased interest in sex, mood swings, headaches, or lack  of sleep. Treatment for this condition may include hormone replacement therapy. Take actions to keep yourself healthy, including exercising regularly, eating a healthy diet, watching your weight, and checking your blood pressure and blood sugar levels. Get screened for cancer and depression. Make sure that you are up to date with all your vaccines. This information is not intended to replace advice given to you by your health care provider. Make sure you discuss any questions you have with your health care provider. Document Revised: 01/28/2021 Document Reviewed: 01/28/2021 Elsevier Patient Education  2024 [employer].

## 2024-04-14 ENCOUNTER — Ambulatory Visit: Payer: Self-pay | Admitting: Internal Medicine

## 2024-04-14 ENCOUNTER — Other Ambulatory Visit: Payer: Self-pay

## 2024-04-14 DIAGNOSIS — E039 Hypothyroidism, unspecified: Secondary | ICD-10-CM

## 2024-04-14 MED ORDER — ATORVASTATIN CALCIUM 80 MG PO TABS: 80.0000 mg | ORAL_TABLET | Freq: Every day | ORAL | 1 refills | Status: DC

## 2024-04-14 MED ORDER — LEVOTHYROXINE SODIUM 125 MCG PO TABS: 125.0000 ug | ORAL_TABLET | Freq: Every day | ORAL | 3 refills | Status: DC

## 2024-04-14 NOTE — Patient Instructions (Signed)
 [name] - I am sorry I was unable to reach you today for our scheduled appointment. I work with Antonette Angeline ORN, NP and am calling to support your healthcare needs. Please contact me at [phone] at your earliest convenience. I look forward to speaking with you soon.   Thank you,   Jackson Acron [facility] RN Care Manager Direct Dial: [phone]  Fax: [phone] Website: [email or web address]

## 2024-04-14 NOTE — Telephone Encounter (Signed)
-----   Message from [facility] sent at 04/14/2024 11:05 AM EDT ----- No evidence of anemia on her blood counts.  Her platelets are a little low but they have been low and this is stable.  We will continue to monitor.  Her TSH is low.  We need to get back her  levothyroxine  down to 125 mcg daily.  Please let me know if she is agreeable with this.  I will order a future TSH for her in 1 month, she will need to schedule a lab only appointment for this.  Her  LDL cholesterol has improved to 86 but we would like this number <70.  Her triglycerides are also elevated at 168.  Please verify that she is taking atorvastatin  40 mg daily?  If she is I would like  to increase this to 80 mg daily.  Please let me know if she is agreeable.  Her liver, kidney function and electrolytes are normal.  She does not have diabetes. ----- Message ----- From: Rebecka Hose Lab Results In Sent: 04/13/2024  10:29 PM EDT To: Angeline LELON Laura, NP

## 2024-04-14 NOTE — Telephone Encounter (Signed)
 Patient says she is taking her atorvastatin .  Advised her to lower her dose of levothyroxine  and lab appointment schedule

## 2024-04-14 NOTE — Progress Notes (Unsigned)
 Cardiology Office Note  Date:  04/15/2024   ID:  [name], DOB 1940-11-16, MRN [id number]  PCP:  Antonette Angeline ORN, NP   Chief Complaint  Patient presents with   3 month follow up     Doing well.     HPI:  [name] a 83 y.o. femalewith past medical history of: hypertension,  hyperlipidemia,  hypothyroidism,  obstructive sleep apnea not on CPAP, anemia, anxiety, arthritis, status post left hip surgery,  postoperative atrial fibrillation,  Bicuspid AV EF 50 to 55% Who presents today for follow-up of her  paroxysmal atrial fibrillation.   Prior history reviewed Postoperative atrial fibrillation after hip surgery January 2025 Started on amiodarone  infusion, metoprolol  tartrate with improved rate control Cardioversion for atrial fibrillation October 08, 2023  In follow-up office visits was not on amiodarone  On today's visit reports she is taking metoprolol  succinate 50 daily Denies any orthostasis symptoms, heart rate 48 bpm Heart rate was 52 bpm in April 2025 in the office  Presents today in a wheelchair Husband does all of her medications for her   Notes indicating mild dementia  Otherwise no other complaints  EKG personally reviewed by myself on todays visit EKG Interpretation Date/Time:  Friday April 15 2024 12:12:05 EDT Ventricular Rate:  48 PR Interval:  144 QRS Duration:  78 QT Interval:  472 QTC Calculation: 421 R Axis:   -18  Text Interpretation: Sinus bradycardia Left ventricular hypertrophy with repolarization abnormality ( R in aVL ) When compared with ECG of 31-Oct-2023 18:03, No significant change was found Confirmed by Perla Lye [id number]) on 04/15/2024 12:39:41 PM    PMH:   has a past medical history of Allergy, Anemia, Anxiety, Arthritis, Cancer (HCC), Hyperlipidemia, Hypertension, Hypothyroidism, Sciatica, Sleep apnea, Thrombocytopenia (HCC), and Thyroid  disease.  PSH:    Past Surgical History:  Procedure Laterality Date    ABDOMINAL HYSTERECTOMY     APPENDECTOMY     BACK SURGERY     lower   BILATERAL CARPAL TUNNEL RELEASE     BREAST CYST ASPIRATION Left    neg   CARDIOVERSION N/A 10/08/2023   Procedure: CARDIOVERSION;  Surgeon: Perla Lye PARAS, MD;  Location: [facility];  Service: Cardiovascular;  Laterality: N/A;   COLONOSCOPY     COLONOSCOPY WITH PROPOFOL  N/A 04/23/2021   Procedure: COLONOSCOPY WITH PROPOFOL ;  Surgeon: Janalyn Keene NOVAK, MD;  Location: [facility];  Service: Endoscopy;  Laterality: N/A;   ENTROPIAN REPAIR Left 06/08/2020   Procedure: ENTROPION REPAIR, SUTURES ENTROPION REPAIR, EXTENSIVE LEFT;  Surgeon: Ashley Greig HERO, MD;  Location: [facility];  Service: Ophthalmology;  Laterality: Left;   EYE SURGERY     FRACTURE SURGERY     HERNIA REPAIR     INTRAMEDULLARY (IM) NAIL INTERTROCHANTERIC Left 10/04/2023   Procedure: INTRAMEDULLARY (IM) NAIL INTERTROCHANTERIC;  Surgeon: Edie Norleen PARAS, MD;  Location: [facility];  Service: Orthopedics;  Laterality: Left;   JOINT REPLACEMENT     knees   KNEE SURGERY Bilateral    KNEE SURGERY     OPEN REDUCTION INTERNAL FIXATION (ORIF) DISTAL RADIAL FRACTURE Left 06/23/2019   Procedure: OPEN REDUCTION INTERNAL FIXATION (ORIF) DISTAL RADIAL FRACTURE;  Surgeon: Kathlynn Sharper, MD;  Location: [facility];  Service: Orthopedics;  Laterality: Left;   PARATHYROIDECTOMY  04/27/2020   salivary stone remove     SPINAL CORD STIMULATOR IMPLANT Right    SPINAL CORD STIMULATOR REMOVAL N/A 07/28/2022   Procedure: REMOVAL OF SPINAL CORD STIMULATOR;  Surgeon: Clois Fret, MD;  Location: [facility];  Service: Neurosurgery;  Laterality: N/A;   THYROIDECTOMY Right 04/27/2020   Procedure: PARATHYROIDECTOMY;  Surgeon: Gladis Cough, MD;  Location: [facility];  Service: General;  Laterality: Right;   TOTAL KNEE REVISION Right 08/13/2015   Procedure: TOTAL KNEE REVISION;  Surgeon: Cough Car, MD;  Location: [facility];  Service: Orthopedics;  Laterality: Right;     Current Outpatient Medications  Medication Sig Dispense Refill   acetaminophen  (TYLENOL ) 500 MG tablet Take 500 mg by mouth every 6 (six) hours as needed for moderate pain.      apixaban  (ELIQUIS ) 5 MG TABS tablet Take 1 tablet (5 mg total) by mouth 2 (two) times daily. 28 tablet 0   atorvastatin  (LIPITOR) 40 MG tablet Take 40 mg by mouth daily.     cetirizine  (ZYRTEC ) 10 MG tablet Take 10 mg by mouth daily.     citalopram  (CELEXA ) 20 MG tablet TAKE 1 TABLET(20 MG) BY MOUTH DAILY 90 tablet 1   donepezil  (ARICEPT  ODT) 5 MG disintegrating tablet Take 1 tablet (5 mg total) by mouth at bedtime. 90 tablet 1   levothyroxine  (SYNTHROID ) 150 MCG tablet Take 150 mcg by mouth daily before breakfast.     Melatonin 10 MG CAPS Take by mouth at bedtime.     Multiple Vitamin (MULTIVITAMIN) capsule Take 1 capsule by mouth daily.     traZODone  (DESYREL ) 50 MG tablet TAKE 1 TABLET(50 MG) BY MOUTH AT BEDTIME AS NEEDED FOR SLEEP 90 tablet 0   amiodarone  (PACERONE ) 200 MG tablet Take 1 tablet (200 mg total) by mouth daily. (Patient not taking: Reported on 04/13/2024) 30 tablet 0   amoxicillin -clavulanate (AUGMENTIN ) 875-125 MG tablet Take 1 tablet by mouth 2 (two) times daily. (Patient not taking: Reported on 04/13/2024) 20 tablet 0   apixaban  (ELIQUIS ) 5 MG TABS tablet Take 1 tablet (5 mg total) by mouth 2 (two) times daily. 60 tablet 11   HYDROcodone -acetaminophen  (NORCO/VICODIN) 5-325 MG tablet Take 1 tablet by mouth every 6 (six) hours as needed for severe pain (pain score 7-10). (Patient not taking: Reported on 04/15/2024) 30 tablet 0   metoprolol  succinate (TOPROL -XL) 25 MG 24 hr tablet TAKE 1 TABLET BY MOUTH EVERY DAY WITH OR IMMEDIATELY FOLLOWING A MEAL 90 tablet 3   predniSONE  (DELTASONE ) 20 MG tablet Take daily with food. Start with 60mg  (3 pills) x 2 days, then reduce to 40mg  (2 pills) x 2 days, then 20mg  (1 pill) x 3 days (Patient not taking: Reported on 04/15/2024) 13 tablet 0   No current  facility-administered medications for this visit.     Allergies:   Codeine, Morphine , Ace inhibitors, and Adhesive [tape]   Social History:  The patient  reports that she has been smoking cigarettes. She has a 20 pack-year smoking history. She has never used smokeless tobacco. She reports that she does not drink alcohol  and does not use drugs.   Family History:   family history includes Breast cancer in her paternal aunt; Dementia in her mother and sister; Diabetes in her brother; Heart attack in her brother and son.    Review of Systems: Review of Systems  Constitutional: Negative.   HENT: Negative.    Respiratory: Negative.    Cardiovascular: Negative.   Gastrointestinal: Negative.   Musculoskeletal: Negative.   Neurological: Negative.   Psychiatric/Behavioral: Negative.    All other systems reviewed and are negative.   PHYSICAL EXAM: VS:  BP 110/60 (BP Location: Left Arm, Patient Position: Sitting, Cuff Size: Normal)  Pulse (!) 48   Ht 5' 3 (1.6 m)   Wt 144 lb 8 oz (65.5 kg)   LMP  (LMP Unknown)   SpO2 96%   BMI 25.60 kg/m  , BMI Body mass index is 25.6 kg/m. GEN: Well nourished, well developed, in no acute distress HEENT: normal Neck: no JVD, carotid bruits, or masses Cardiac: RRR; no murmurs, rubs, or gallops,no edema  Respiratory:  clear to auscultation bilaterally, normal work of breathing GI: soft, nontender, nondistended, + BS MS: no deformity or atrophy Skin: warm and dry, no rash Neuro:  Strength and sensation are intact Psych: euthymic mood, full affect   Recent Labs: 10/08/2023: Magnesium  2.0 04/13/2024: ALT 10; BUN 15; Creat 0.83; Hemoglobin 14.0; Platelets 116; Potassium 4.0; Sodium 141; TSH 0.27    Lipid Panel Lab Results  Component Value Date   CHOL 167 04/13/2024   HDL 54 04/13/2024   LDLCALC 86 04/13/2024   TRIG 168 (H) 04/13/2024      Wt Readings from Last 3 Encounters:  04/15/24 144 lb 8 oz (65.5 kg)  04/13/24 144 lb (65.3 kg)   01/13/24 144 lb (65.3 kg)       ASSESSMENT AND PLAN:  Problem List Items Addressed This Visit       Cardiology Problems   Hypertension   Relevant Medications   atorvastatin  (LIPITOR) 40 MG tablet   metoprolol  succinate (TOPROL -XL) 25 MG 24 hr tablet   Mixed Alzheimer and vascular dementia (HCC)   Relevant Medications   atorvastatin  (LIPITOR) 40 MG tablet   metoprolol  succinate (TOPROL -XL) 25 MG 24 hr tablet   Atrial fibrillation with rapid ventricular response (HCC) - Primary   Relevant Medications   atorvastatin  (LIPITOR) 40 MG tablet   metoprolol  succinate (TOPROL -XL) 25 MG 24 hr tablet   Other Relevant Orders   EKG 12-Lead (Completed)   Hyperlipidemia   Relevant Medications   atorvastatin  (LIPITOR) 40 MG tablet   metoprolol  succinate (TOPROL -XL) 25 MG 24 hr tablet   Aortic atherosclerosis (HCC)   Relevant Medications   atorvastatin  (LIPITOR) 40 MG tablet   metoprolol  succinate (TOPROL -XL) 25 MG 24 hr tablet     Other   Obstructive chronic bronchitis without exacerbation (HCC)   Other Visit Diagnoses       Sore throat       Relevant Medications   metoprolol  succinate (TOPROL -XL) 25 MG 24 hr tablet       postop atrial fibrillation with RVR/atrial flutter atrial fibrillation January 12  following surgery -Started on heparin  , amiodarone   metoprolol  tartrate  Underwent cardioversion -Remains on Eliquis , metoprolol  succinate 50 daily, off oral amiodarone  -Given bradycardia rate 48 bpm on EKG, low blood pressure, frail gait, recommend she decrease metoprolol  succinate down to 25 daily   Essential hypertension Recommend she reduce metoprolol  succinate down to 25 daily If blood pressure continues to run low may need to decrease amlodipine  down to 5 daily   status post left hip repair Prior mechanical fall, presenting today in a wheelchair, sedentary at baseline   Dementia Mild, husband helps with home activities   Signed, Velinda Lunger, M.D., Ph.D. [facility] [place], [place] [phone]

## 2024-04-15 ENCOUNTER — Ambulatory Visit: Attending: Cardiovascular Disease | Admitting: Cardiovascular Disease

## 2024-04-15 ENCOUNTER — Encounter: Payer: Self-pay | Admitting: Cardiovascular Disease

## 2024-04-15 VITALS — BP 110/60 | HR 48 | Ht 63.0 in | Wt 144.5 lb

## 2024-04-15 DIAGNOSIS — E782 Mixed hyperlipidemia: Secondary | ICD-10-CM | POA: Diagnosis not present

## 2024-04-15 DIAGNOSIS — G309 Alzheimer's disease, unspecified: Secondary | ICD-10-CM | POA: Diagnosis not present

## 2024-04-15 DIAGNOSIS — I4891 Unspecified atrial fibrillation: Secondary | ICD-10-CM

## 2024-04-15 DIAGNOSIS — I700 Atherosclerosis of aorta: Secondary | ICD-10-CM | POA: Diagnosis not present

## 2024-04-15 DIAGNOSIS — J4489 Other specified chronic obstructive pulmonary disease: Secondary | ICD-10-CM | POA: Diagnosis not present

## 2024-04-15 DIAGNOSIS — I10 Essential (primary) hypertension: Secondary | ICD-10-CM

## 2024-04-15 DIAGNOSIS — F0280 Dementia in other diseases classified elsewhere without behavioral disturbance: Secondary | ICD-10-CM | POA: Diagnosis not present

## 2024-04-15 DIAGNOSIS — F0150 Vascular dementia without behavioral disturbance: Secondary | ICD-10-CM | POA: Diagnosis not present

## 2024-04-15 DIAGNOSIS — J029 Acute pharyngitis, unspecified: Secondary | ICD-10-CM | POA: Diagnosis not present

## 2024-04-15 MED ORDER — METOPROLOL SUCCINATE ER 25 MG PO TB24
ORAL_TABLET | ORAL | 3 refills | Status: AC
Start: 2024-04-15 — End: ?

## 2024-04-15 NOTE — Patient Instructions (Addendum)
 Medication Instructions:   Please cut the metoprolol  succinate down to 25 mg daily (1/2 pill of the 50 mg)  If you need a refill on your cardiac medications before your next appointment, please call your pharmacy.   Lab work: No new labs needed  Testing/Procedures: No new testing needed  Follow-Up: At [facility], you and your health needs are our priority.  As part of our continuing mission to provide you with exceptional heart care, we have created designated Provider Care Teams.  These Care Teams include your primary Cardiologist (physician) and Advanced Practice Providers (APPs -  Physician Assistants and Nurse Practitioners) who all work together to provide you with the care you need, when you need it.  You will need a follow up appointment in 6 months  Providers on your designated Care Team:   Lonni Meager, NP Bernardino Bring, PA-C [place], [place]  COVID-19 Vaccine Information can be found at: [email or web address] For questions related to vaccine distribution or appointments, please email vaccine@[email or web address] or call [phone].

## 2024-05-05 ENCOUNTER — Other Ambulatory Visit

## 2024-05-06 ENCOUNTER — Other Ambulatory Visit

## 2024-05-06 DIAGNOSIS — E039 Hypothyroidism, unspecified: Secondary | ICD-10-CM

## 2024-05-06 LAB — TSH: TSH: 0.09 m[IU]/L — ABNORMAL LOW (ref 0.40–4.50)

## 2024-05-07 ENCOUNTER — Ambulatory Visit: Payer: Self-pay | Admitting: Internal Medicine

## 2024-05-09 ENCOUNTER — Telehealth: Payer: Self-pay

## 2024-05-09 NOTE — Telephone Encounter (Signed)
 Her dose has changed so much and I'm concerned that she is taking multiple different doses. Can he look at her pill bottles at home and let me know. I believe he is the one giving her her medications.

## 2024-05-09 NOTE — Telephone Encounter (Signed)
 Left message for patient to return call OK  to find out milligram of synthroid 

## 2024-05-09 NOTE — Telephone Encounter (Signed)
 Copied from CRM #[id number]. Topic: Clinical - Medication Question >> May 09, 2024  9:35 AM Gustabo D wrote: Patient's husband is returning call to [facility]- He says he doesn't know what dose she's taking and says it should be on her chart. The chart says-levothyroxine  (SYNTHROID ) 150 MCG tablet.

## 2024-05-10 ENCOUNTER — Other Ambulatory Visit: Payer: Self-pay

## 2024-05-10 MED ORDER — LEVOTHYROXINE SODIUM 125 MCG PO TABS: 125.0000 ug | ORAL_TABLET | Freq: Every day | ORAL | 1 refills | Status: DC

## 2024-05-10 NOTE — Telephone Encounter (Signed)
 Spoke with [name], he will trash the 150 mcg of levothyroxine . New script for 125 mcg of levothyroxine  sent in. Appointment scheduled for lab work

## 2024-05-10 NOTE — Telephone Encounter (Signed)
 Is he agreeable with decreasing this to 125 mcg daily?  If so, please send in #90, 1 refill.  I would like for him to throw out the 150 mcg bottle.  She would also need to recheck TSH in 1 month, lab

## 2024-05-23 ENCOUNTER — Other Ambulatory Visit: Payer: Self-pay | Admitting: Internal Medicine

## 2024-05-24 NOTE — Telephone Encounter (Signed)
 Requested Prescriptions  Pending Prescriptions Disp Refills   donepezil  (ARICEPT  ODT) 5 MG disintegrating tablet [Pharmacy Med Name: DONEPEZIL  ODT 5MG  TABLETS] 90 tablet 1    Sig: DISSOLVE 1 TABLET(5 MG) ON THE TONGUE AT BEDTIME     Neurology:  Alzheimer's Agents Passed - 05/24/2024  2:46 PM      Passed - Valid encounter within last 6 months    Recent Outpatient Visits           1 month ago Encounter for general adult medical examination with abnormal findings   [place] [facility] [place], Angeline ORN, NP   4 months ago ETD (Eustachian tube dysfunction), bilateral   [place] [facility] [place], Marsa PARAS, DO   5 months ago ETD (Eustachian tube dysfunction), left   [place] [facility] [place], Angeline ORN, NP   5 months ago Mixed Alzheimer and vascular dementia [facility])   [place] [facility] [place], Angeline ORN, NP   6 months ago Closed fracture of left hip with routine healing, subsequent encounter   [place] [facility] [place], Angeline ORN, [place]

## 2024-06-13 ENCOUNTER — Other Ambulatory Visit: Payer: Self-pay

## 2024-06-13 DIAGNOSIS — E039 Hypothyroidism, unspecified: Secondary | ICD-10-CM

## 2024-06-14 ENCOUNTER — Other Ambulatory Visit

## 2024-06-14 DIAGNOSIS — E039 Hypothyroidism, unspecified: Secondary | ICD-10-CM | POA: Diagnosis not present

## 2024-06-15 ENCOUNTER — Ambulatory Visit: Payer: Self-pay | Admitting: Internal Medicine

## 2024-06-15 LAB — TSH: TSH: 0.28 m[IU]/L — ABNORMAL LOW (ref 0.40–4.50)

## 2024-06-15 MED ORDER — LEVOTHYROXINE SODIUM 112 MCG PO TABS: 112.0000 ug | ORAL_TABLET | Freq: Every day | ORAL | 3 refills | Status: AC

## 2024-07-07 ENCOUNTER — Other Ambulatory Visit: Payer: Self-pay | Admitting: Internal Medicine

## 2024-07-07 DIAGNOSIS — F5104 Psychophysiologic insomnia: Secondary | ICD-10-CM

## 2024-07-08 ENCOUNTER — Other Ambulatory Visit: Payer: Self-pay | Admitting: Internal Medicine

## 2024-07-08 DIAGNOSIS — F419 Anxiety disorder, unspecified: Secondary | ICD-10-CM

## 2024-07-11 NOTE — Telephone Encounter (Signed)
 Requested Prescriptions  Pending Prescriptions Disp Refills   traZODone  (DESYREL ) 50 MG tablet [Pharmacy Med Name: TRAZODONE  50MG  TABLETS] 90 tablet 1    Sig: TAKE 1 TABLET(50 MG) BY MOUTH AT BEDTIME AS NEEDED FOR SLEEP     Psychiatry: Antidepressants - Serotonin Modulator Passed - 07/11/2024  7:43 AM      Passed - Valid encounter within last 6 months    Recent Outpatient Visits           2 months ago Encounter for general adult medical examination with abnormal findings   [place] [facility] [place], Angeline ORN, NP   6 months ago ETD (Eustachian tube dysfunction), bilateral   [place] [facility] [place], Marsa PARAS, DO   6 months ago ETD (Eustachian tube dysfunction), left   [place] [facility] [place], Angeline ORN, NP   7 months ago Mixed Alzheimer and vascular dementia [facility])   [place] [facility] [place], Angeline ORN, NP   8 months ago Closed fracture of left hip with routine healing, subsequent encounter   [facility] [place] [facility] [place], Angeline ORN, [place]

## 2024-07-11 NOTE — Telephone Encounter (Signed)
 Requested Prescriptions  Pending Prescriptions Disp Refills   citalopram  (CELEXA ) 20 MG tablet [Pharmacy Med Name: CITALOPRAM  20MG  TABLETS] 90 tablet 1    Sig: TAKE 1 TABLET(20 MG) BY MOUTH DAILY     Psychiatry:  Antidepressants - SSRI Passed - 07/11/2024  4:16 PM      Passed - Valid encounter within last 6 months    Recent Outpatient Visits           2 months ago Encounter for general adult medical examination with abnormal findings   [place] [facility], Angeline ORN, NP   6 months ago ETD (Eustachian tube dysfunction), bilateral   [place] [facility] [place], Marsa PARAS, DO   6 months ago ETD (Eustachian tube dysfunction), left   [employer] [facility] [place], Angeline ORN, NP   7 months ago Mixed Alzheimer and vascular dementia [facility])   [place] [facility] [place], Angeline ORN, NP   8 months ago Closed fracture of left hip with routine healing, subsequent encounter   [facility] [place], Angeline ORN, [place]

## 2024-09-05 ENCOUNTER — Other Ambulatory Visit: Payer: Self-pay | Admitting: Internal Medicine

## 2024-09-05 DIAGNOSIS — I10 Essential (primary) hypertension: Secondary | ICD-10-CM

## 2024-09-07 NOTE — Telephone Encounter (Signed)
 Discontinued by provider on 04/13/24, refusing due to this.   Requested Prescriptions  Pending Prescriptions Disp Refills   amLODipine  (NORVASC ) 10 MG tablet [Pharmacy Med Name: AMLODIPINE  BESYLATE 10MG TABLETS] 90 tablet 0    Sig: TAKE 1 TABLET(10 MG) BY MOUTH DAILY     Cardiovascular: Calcium  Channel Blockers 2 Passed - 09/07/2024  5:26 PM      Passed - Last BP in normal range    BP Readings from Last 1 Encounters:  04/15/24 110/60         Passed - Last Heart Rate in normal range    Pulse Readings from Last 1 Encounters:  04/15/24 (!) 48         Passed - Valid encounter within last 6 months    Recent Outpatient Visits           4 months ago Encounter for general adult medical examination with abnormal findings   [place] [facility] [place], Angeline ORN, NP   8 months ago ETD (Eustachian tube dysfunction), bilateral   [place] [facility] [place], Marsa PARAS, DO   8 months ago ETD (Eustachian tube dysfunction), left   [place] [facility] [place], Angeline ORN, NP   9 months ago Mixed Alzheimer and vascular dementia [facility])   [place] [facility] [place], Angeline ORN, NP   9 months ago Closed fracture of left hip with routine healing, subsequent encounter   [place] [facility] [place], Angeline ORN, [place]

## 2024-09-19 ENCOUNTER — Telehealth: Payer: Self-pay

## 2024-09-19 NOTE — Telephone Encounter (Signed)
 Copied from CRM #[id number]. Topic: Clinical - Medication Question >> Sep 19, 2024 11:09 AM Shanda MATSU wrote: Reason for CRM: Caller called in req to go over patient's meds due to pharmacy calling them and adv that patient has 16 meds that can be refilled, caller stated patient does not take 16 meds so he doesn't know what meds the pharmacy is referring to, is req a call back.

## 2024-09-19 NOTE — Telephone Encounter (Signed)
 [facility]oke to [facility] to find out which medications needed to refilled. Patient has refills there she can pick up  Spoke to [place] notified him that the medications she needs has refills

## 2024-09-25 ENCOUNTER — Other Ambulatory Visit: Payer: Self-pay | Admitting: Internal Medicine

## 2024-09-27 NOTE — Telephone Encounter (Signed)
 Requested Prescriptions  Pending Prescriptions Disp Refills   donepezil  (ARICEPT  ODT) 5 MG disintegrating tablet [Pharmacy Med Name: DONEPEZIL  ODT 5MG  TABLETS] 90 tablet 0    Sig: DISSOLVE 1 TABLET(5 MG) ON THE TONGUE AT BEDTIME     Neurology:  Alzheimer's Agents Passed - 09/27/2024 11:50 AM      Passed - Valid encounter within last 6 months    Recent Outpatient Visits           5 months ago Encounter for general adult medical examination with abnormal findings   [place] [facility] [place], Angeline ORN, NP   8 months ago ETD (Eustachian tube dysfunction), bilateral   [place] [facility] [place], Marsa PARAS, DO   9 months ago ETD (Eustachian tube dysfunction), left   [place] [facility] [place], Angeline ORN, NP   9 months ago Mixed Alzheimer and vascular dementia [facility])   [place] [facility] [place], Angeline ORN, NP   10 months ago Closed fracture of left hip with routine healing, subsequent encounter   [facility] [place], Angeline ORN, [place]

## 2024-10-13 ENCOUNTER — Inpatient Hospital Stay
Admission: EM | Admit: 2024-10-13 | Discharge: 2024-10-25 | DRG: 308 | Disposition: A | Discharge status: Home / Self Care | Attending: Obstetrics and Gynecology | Admitting: Obstetrics and Gynecology

## 2024-10-13 ENCOUNTER — Emergency Department

## 2024-10-13 ENCOUNTER — Other Ambulatory Visit: Payer: Self-pay

## 2024-10-13 DIAGNOSIS — F03B3 Unspecified dementia, moderate, with mood disturbance: Secondary | ICD-10-CM | POA: Diagnosis present

## 2024-10-13 DIAGNOSIS — I480 Paroxysmal atrial fibrillation: Secondary | ICD-10-CM

## 2024-10-13 DIAGNOSIS — I10 Essential (primary) hypertension: Secondary | ICD-10-CM | POA: Insufficient documentation

## 2024-10-13 DIAGNOSIS — F05 Delirium due to known physiological condition: Secondary | ICD-10-CM | POA: Diagnosis not present

## 2024-10-13 DIAGNOSIS — Z6830 Body mass index (BMI) 30.0-30.9, adult: Secondary | ICD-10-CM

## 2024-10-13 DIAGNOSIS — I160 Hypertensive urgency: Secondary | ICD-10-CM | POA: Diagnosis present

## 2024-10-13 DIAGNOSIS — I361 Nonrheumatic tricuspid (valve) insufficiency: Secondary | ICD-10-CM | POA: Diagnosis not present

## 2024-10-13 DIAGNOSIS — F03B11 Unspecified dementia, moderate, with agitation: Secondary | ICD-10-CM | POA: Diagnosis present

## 2024-10-13 DIAGNOSIS — Z803 Family history of malignant neoplasm of breast: Secondary | ICD-10-CM

## 2024-10-13 DIAGNOSIS — Z79899 Other long term (current) drug therapy: Secondary | ICD-10-CM

## 2024-10-13 DIAGNOSIS — E785 Hyperlipidemia, unspecified: Secondary | ICD-10-CM | POA: Diagnosis present

## 2024-10-13 DIAGNOSIS — Z9981 Dependence on supplemental oxygen: Secondary | ICD-10-CM

## 2024-10-13 DIAGNOSIS — T502X5A Adverse effect of carbonic-anhydrase inhibitors, benzothiadiazides and other diuretics, initial encounter: Secondary | ICD-10-CM | POA: Diagnosis present

## 2024-10-13 DIAGNOSIS — F419 Anxiety disorder, unspecified: Secondary | ICD-10-CM | POA: Diagnosis present

## 2024-10-13 DIAGNOSIS — I083 Combined rheumatic disorders of mitral, aortic and tricuspid valves: Secondary | ICD-10-CM | POA: Diagnosis present

## 2024-10-13 DIAGNOSIS — J810 Acute pulmonary edema: Secondary | ICD-10-CM

## 2024-10-13 DIAGNOSIS — Z66 Do not resuscitate: Secondary | ICD-10-CM | POA: Diagnosis present

## 2024-10-13 DIAGNOSIS — F17210 Nicotine dependence, cigarettes, uncomplicated: Secondary | ICD-10-CM | POA: Diagnosis present

## 2024-10-13 DIAGNOSIS — I959 Hypotension, unspecified: Secondary | ICD-10-CM | POA: Diagnosis present

## 2024-10-13 DIAGNOSIS — D696 Thrombocytopenia, unspecified: Secondary | ICD-10-CM | POA: Diagnosis present

## 2024-10-13 DIAGNOSIS — R001 Bradycardia, unspecified: Secondary | ICD-10-CM | POA: Diagnosis not present

## 2024-10-13 DIAGNOSIS — I4891 Unspecified atrial fibrillation: Secondary | ICD-10-CM | POA: Diagnosis not present

## 2024-10-13 DIAGNOSIS — I340 Nonrheumatic mitral (valve) insufficiency: Secondary | ICD-10-CM | POA: Diagnosis not present

## 2024-10-13 DIAGNOSIS — I5020 Unspecified systolic (congestive) heart failure: Secondary | ICD-10-CM | POA: Diagnosis not present

## 2024-10-13 DIAGNOSIS — Z885 Allergy status to narcotic agent status: Secondary | ICD-10-CM

## 2024-10-13 DIAGNOSIS — F32A Depression, unspecified: Secondary | ICD-10-CM | POA: Diagnosis present

## 2024-10-13 DIAGNOSIS — I5023 Acute on chronic systolic (congestive) heart failure: Secondary | ICD-10-CM | POA: Diagnosis present

## 2024-10-13 DIAGNOSIS — E039 Hypothyroidism, unspecified: Secondary | ICD-10-CM | POA: Diagnosis present

## 2024-10-13 DIAGNOSIS — F5104 Psychophysiologic insomnia: Secondary | ICD-10-CM | POA: Diagnosis not present

## 2024-10-13 DIAGNOSIS — I4892 Unspecified atrial flutter: Secondary | ICD-10-CM | POA: Diagnosis present

## 2024-10-13 DIAGNOSIS — T501X5A Adverse effect of loop [high-ceiling] diuretics, initial encounter: Secondary | ICD-10-CM | POA: Diagnosis not present

## 2024-10-13 DIAGNOSIS — Z90710 Acquired absence of both cervix and uterus: Secondary | ICD-10-CM

## 2024-10-13 DIAGNOSIS — R000 Tachycardia, unspecified: Secondary | ICD-10-CM | POA: Diagnosis not present

## 2024-10-13 DIAGNOSIS — J449 Chronic obstructive pulmonary disease, unspecified: Secondary | ICD-10-CM | POA: Diagnosis present

## 2024-10-13 DIAGNOSIS — E876 Hypokalemia: Secondary | ICD-10-CM | POA: Diagnosis not present

## 2024-10-13 DIAGNOSIS — Z8249 Family history of ischemic heart disease and other diseases of the circulatory system: Secondary | ICD-10-CM

## 2024-10-13 DIAGNOSIS — Y92239 Unspecified place in hospital as the place of occurrence of the external cause: Secondary | ICD-10-CM | POA: Diagnosis not present

## 2024-10-13 DIAGNOSIS — I4819 Other persistent atrial fibrillation: Principal | ICD-10-CM | POA: Diagnosis present

## 2024-10-13 DIAGNOSIS — F03B4 Unspecified dementia, moderate, with anxiety: Secondary | ICD-10-CM | POA: Diagnosis present

## 2024-10-13 DIAGNOSIS — Z96653 Presence of artificial knee joint, bilateral: Secondary | ICD-10-CM | POA: Diagnosis present

## 2024-10-13 DIAGNOSIS — M542 Cervicalgia: Secondary | ICD-10-CM | POA: Diagnosis not present

## 2024-10-13 DIAGNOSIS — Z7984 Long term (current) use of oral hypoglycemic drugs: Secondary | ICD-10-CM

## 2024-10-13 DIAGNOSIS — K5900 Constipation, unspecified: Secondary | ICD-10-CM | POA: Diagnosis not present

## 2024-10-13 DIAGNOSIS — Z5941 Food insecurity: Secondary | ICD-10-CM

## 2024-10-13 DIAGNOSIS — Q2381 Bicuspid aortic valve: Secondary | ICD-10-CM

## 2024-10-13 DIAGNOSIS — J029 Acute pharyngitis, unspecified: Secondary | ICD-10-CM

## 2024-10-13 DIAGNOSIS — I358 Other nonrheumatic aortic valve disorders: Secondary | ICD-10-CM | POA: Diagnosis present

## 2024-10-13 DIAGNOSIS — Z833 Family history of diabetes mellitus: Secondary | ICD-10-CM

## 2024-10-13 DIAGNOSIS — Z85828 Personal history of other malignant neoplasm of skin: Secondary | ICD-10-CM

## 2024-10-13 DIAGNOSIS — I700 Atherosclerosis of aorta: Secondary | ICD-10-CM | POA: Diagnosis present

## 2024-10-13 DIAGNOSIS — Z9682 Presence of neurostimulator: Secondary | ICD-10-CM

## 2024-10-13 DIAGNOSIS — I5031 Acute diastolic (congestive) heart failure: Secondary | ICD-10-CM

## 2024-10-13 DIAGNOSIS — E66811 Obesity, class 1: Secondary | ICD-10-CM | POA: Diagnosis present

## 2024-10-13 DIAGNOSIS — Z7901 Long term (current) use of anticoagulants: Secondary | ICD-10-CM | POA: Diagnosis not present

## 2024-10-13 DIAGNOSIS — Z515 Encounter for palliative care: Secondary | ICD-10-CM

## 2024-10-13 DIAGNOSIS — I5021 Acute systolic (congestive) heart failure: Secondary | ICD-10-CM | POA: Diagnosis not present

## 2024-10-13 DIAGNOSIS — R531 Weakness: Secondary | ICD-10-CM | POA: Diagnosis present

## 2024-10-13 DIAGNOSIS — I420 Dilated cardiomyopathy: Secondary | ICD-10-CM | POA: Diagnosis present

## 2024-10-13 DIAGNOSIS — Z79890 Hormone replacement therapy (postmenopausal): Secondary | ICD-10-CM

## 2024-10-13 DIAGNOSIS — R0602 Shortness of breath: Secondary | ICD-10-CM | POA: Diagnosis present

## 2024-10-13 DIAGNOSIS — I110 Hypertensive heart disease with heart failure: Secondary | ICD-10-CM | POA: Diagnosis present

## 2024-10-13 DIAGNOSIS — Z91048 Other nonmedicinal substance allergy status: Secondary | ICD-10-CM

## 2024-10-13 DIAGNOSIS — F03B0 Unspecified dementia, moderate, without behavioral disturbance, psychotic disturbance, mood disturbance, and anxiety: Secondary | ICD-10-CM

## 2024-10-13 DIAGNOSIS — Z888 Allergy status to other drugs, medicaments and biological substances status: Secondary | ICD-10-CM

## 2024-10-13 DIAGNOSIS — I509 Heart failure, unspecified: Secondary | ICD-10-CM | POA: Diagnosis not present

## 2024-10-13 DIAGNOSIS — G4733 Obstructive sleep apnea (adult) (pediatric): Secondary | ICD-10-CM | POA: Diagnosis present

## 2024-10-13 DIAGNOSIS — R262 Difficulty in walking, not elsewhere classified: Secondary | ICD-10-CM | POA: Diagnosis present

## 2024-10-13 HISTORY — DX: Bicuspid aortic valve: Q23.81

## 2024-10-13 HISTORY — DX: Paroxysmal atrial fibrillation: I48.0

## 2024-10-13 LAB — CBC WITH DIFFERENTIAL/PLATELET
Abs Immature Granulocytes: 0.02 K/uL (ref 0.00–0.07)
Basophils Absolute: 0.1 K/uL (ref 0.0–0.1)
Basophils Relative: 1 %
Eosinophils Absolute: 0.1 K/uL (ref 0.0–0.5)
Eosinophils Relative: 1 %
HCT: 39.6 % (ref 36.0–46.0)
Hemoglobin: 13.2 g/dL (ref 12.0–15.0)
Immature Granulocytes: 0 %
Lymphocytes Relative: 24 %
Lymphs Abs: 1.5 K/uL (ref 0.7–4.0)
MCH: 32.8 pg (ref 26.0–34.0)
MCHC: 33.3 g/dL (ref 30.0–36.0)
MCV: 98.3 fL (ref 80.0–100.0)
Monocytes Absolute: 0.8 K/uL (ref 0.1–1.0)
Monocytes Relative: 13 %
Neutro Abs: 3.8 K/uL (ref 1.7–7.7)
Neutrophils Relative %: 61 %
Platelets: 139 K/uL — ABNORMAL LOW (ref 150–400)
RBC: 4.03 MIL/uL (ref 3.87–5.11)
RDW: 14.4 % (ref 11.5–15.5)
WBC: 6.3 K/uL (ref 4.0–10.5)
nRBC: 0 % (ref 0.0–0.2)

## 2024-10-13 LAB — MAGNESIUM: Magnesium: 2.1 mg/dL (ref 1.7–2.4)

## 2024-10-13 LAB — URINALYSIS, ROUTINE W REFLEX MICROSCOPIC
Bacteria, UA: NONE SEEN
Bilirubin Urine: NEGATIVE
Glucose, UA: NEGATIVE mg/dL
Hgb urine dipstick: NEGATIVE
Ketones, ur: NEGATIVE mg/dL
Leukocytes,Ua: NEGATIVE
Nitrite: NEGATIVE
Protein, ur: 100 mg/dL — AB
Specific Gravity, Urine: 1.03 (ref 1.005–1.030)
pH: 5 (ref 5.0–8.0)

## 2024-10-13 LAB — PRO BRAIN NATRIURETIC PEPTIDE: Pro Brain Natriuretic Peptide: 4836 pg/mL — ABNORMAL HIGH

## 2024-10-13 LAB — COMPREHENSIVE METABOLIC PANEL WITH GFR
ALT: 35 U/L (ref 0–44)
AST: 37 U/L (ref 15–41)
Albumin: 3.9 g/dL (ref 3.5–5.0)
Alkaline Phosphatase: 80 U/L (ref 38–126)
Anion gap: 10 (ref 5–15)
BUN: 19 mg/dL (ref 8–23)
CO2: 23 mmol/L (ref 22–32)
Calcium: 9.3 mg/dL (ref 8.9–10.3)
Chloride: 110 mmol/L (ref 98–111)
Creatinine, Ser: 0.69 mg/dL (ref 0.44–1.00)
GFR, Estimated: >60 mL/min
Glucose, Bld: 99 mg/dL (ref 70–99)
Potassium: 3.5 mmol/L (ref 3.5–5.1)
Sodium: 143 mmol/L (ref 135–145)
Total Bilirubin: 1 mg/dL (ref 0.0–1.2)
Total Protein: 6.1 g/dL — ABNORMAL LOW (ref 6.5–8.1)

## 2024-10-13 LAB — TROPONIN T, HIGH SENSITIVITY: Troponin T High Sensitivity: 21 ng/L — ABNORMAL HIGH (ref 0–19)

## 2024-10-13 MED ORDER — DILTIAZEM HCL-DEXTROSE 125-5 MG/125ML-% IV SOLN (PREMIX)
5.0000 mg/h | INTRAVENOUS | Status: DC
  Administered 2024-10-13: 5 mg/h via INTRAVENOUS
  Administered 2024-10-14 (×2): 15 mg/h via INTRAVENOUS
  Administered 2024-10-15: 5 mg/h via INTRAVENOUS
  Administered 2024-10-15: 12.5 mg/h via INTRAVENOUS
  Administered 2024-10-15: 7.5 mg/h via INTRAVENOUS
  Administered 2024-10-15: 15 mg/h via INTRAVENOUS
  Administered 2024-10-15: 10 mg/h via INTRAVENOUS
  Filled 2024-10-13 (×4): qty 125

## 2024-10-13 MED ORDER — FUROSEMIDE 10 MG/ML IJ SOLN
40.0000 mg | Freq: Once | INTRAMUSCULAR | Status: AC
  Administered 2024-10-13: 40 mg via INTRAVENOUS
  Filled 2024-10-13: qty 4

## 2024-10-13 MED ORDER — DILTIAZEM HCL 25 MG/5ML IV SOLN
15.0000 mg | Freq: Once | INTRAVENOUS | Status: AC
  Administered 2024-10-13: 15 mg via INTRAVENOUS
  Filled 2024-10-13: qty 5

## 2024-10-13 NOTE — ED Triage Notes (Signed)
 Pt BIB [facility] from home c/o intermittent SHOB and CP. HR 130-200 for EMS, 10 of Dilt given PTA. Unknown if hx of a fib, pt takes Eliquis  and Metoprolol 

## 2024-10-13 NOTE — ED Provider Notes (Signed)
 "  [facility] Provider Note    Event Date/Time   First MD Initiated Contact with Patient 10/13/24 2026     (approximate)   History   Shortness of Breath and Chest Pain   HPI  [name] is a 84 y.o. female with history of atrial fibrillation who comes in with shortness of breath and chest pain.  I reviewed a note from 04/15/2024 where patient had postoperative atrial fibrillation after hip surgery in January.  Patient was started on Amio, metoprolol  and underwent a cardioversion for A-fib on October 08, 2023.  On the note from 04/15/2024 patient was on the metoprolol  50 daily with heart rates in the 40s to 50s without any symptoms.  He recommended to decrease metoprolol  to 25 mg.  I did review and patient had a TEE guided cardioversion done on 1/17.  Patient's Chad Vascor was at least 4 at that time and was initially discharged on Eliquis .  According to patient's husband who is now at bedside patient has been compliant with Eliquis .  He reports giving 5 mg twice daily.  He reports given the metoprolol  but unclear what her heart rates have been at home.  He reports that for the past 1 week she has been having some intermittent chest pain and shortness of breath.  Currently she is denying any symptoms other than maybe a little bit of mild shortness of breath.  He denies any falls and her hitting her head or any other concerns   Physical Exam   Triage Vital Signs: ED Triage Vitals  Encounter Vitals Group     BP      Girls Systolic BP Percentile      Girls Diastolic BP Percentile      Boys Systolic BP Percentile      Boys Diastolic BP Percentile      Pulse      Resp      Temp      Temp src      SpO2      Weight      Height      Head Circumference      Peak Flow      Pain Score      Pain Loc      Pain Education      Exclude from Growth Chart     Most recent vital signs: Vitals:   10/13/24 2032 10/13/24 2100  BP: (!) 153/112 (!) 146/106  Pulse:  (!) 146 (!) 138  Resp: 19 19  Temp: 97.6 F (36.4 C)   SpO2: 94% 97%     General: Awake, no distress.  CV:  Good peripheral perfusion.  Irregular Resp:  Normal effort.  Clear lungs Abd:  No distention.  Soft and nontender Other:  1+ edema bilaterally..  No calf tenderness   ED Results / Procedures / Treatments   Labs (all labs ordered are listed, but only abnormal results are displayed) Labs Reviewed  CBC WITH DIFFERENTIAL/PLATELET  COMPREHENSIVE METABOLIC PANEL WITH GFR  MAGNESIUM   PRO BRAIN NATRIURETIC PEPTIDE  TROPONIN T, HIGH SENSITIVITY     EKG  My interpretation of EKG:  Atrial fibrillation with a rate of 124 without any ST elevation or T wave inversions, normal intervals  RADIOLOGY I have reviewed the xray personally and interpreted concern for some edema   PROCEDURES:  Critical Care performed: Yes, see critical care procedure note(s)  .1-3 Lead EKG Interpretation  Performed by: Ernest Ronal BRAVO, MD Authorized by:  Ernest Ronal BRAVO, MD     Interpretation: abnormal     ECG rate:  140   ECG rate assessment: tachycardic     Rhythm: atrial fibrillation     Ectopy: none     Conduction: normal   .Critical Care  Performed by: Ernest Ronal BRAVO, MD Authorized by: Ernest Ronal BRAVO, MD   Critical care provider statement:    Critical care time (minutes):  30   Critical care was necessary to treat or prevent imminent or life-threatening deterioration of the following conditions:  CNS failure or compromise   Critical care was time spent personally by me on the following activities:  Development of treatment plan with patient or surrogate, discussions with consultants, evaluation of patient's response to treatment, examination of patient, ordering and review of laboratory studies, ordering and review of radiographic studies, ordering and performing treatments and interventions, pulse oximetry, re-evaluation of patient's condition and review of old charts    MEDICATIONS  ORDERED IN ED: Medications  diltiazem  (CARDIZEM ) 125 mg in dextrose  5% 125 mL (1 mg/mL) infusion (10 mg/hr Intravenous Infusion Verify 10/13/24 2256)  diltiazem  (CARDIZEM ) injection 15 mg (15 mg Intravenous Given 10/13/24 2149)  diltiazem  (CARDIZEM ) injection 15 mg (15 mg Intravenous Given 10/13/24 2247)     IMPRESSION / MDM / ASSESSMENT AND PLAN / ED COURSE  I reviewed the triage vital signs and the nursing notes.   Patient's presentation is most consistent with acute presentation with potential threat to life or bodily function.  Patient comes in with A-fib with RVR.  Will get labs to evaluate for Electra abnormalities, CHF, ACS.  8:38 PM patient was given 10 of diltiazem  and heart rates are still in the 140s.  Patient does have some dementia and I would like to talk to the husband before making decisions about rate control versus electrical versus chemical cardioversion.  We need to make sure that patient is on the Eliquis .  I am going to hold off on giving any further diltiazem  as patient does have a history of heart rates in the 40s at baseline so I do not want to cardiovert her with electricity or Amio and risk causing bradycardia.  9:14 PM discussed with patient's husband given patient has been compliant with the Eliquis  per the husband we could consider Amio versus cardioversion.  He is not sure what he would like to do and he wants to think about it.  I will also discuss with cardiology.  Before making decision we will check patient's blood work given she is stable and this been ongoing for a week to ensure no electrolyte abnormalities, CHF that could be precipitating this.  I do not want to cardiovert her and her go right back into it if she does have some that we could be correcting simultaneously or before cardioverting patient.  Patient is also not been NPO.  She last ate around 5 PM and so doing a cardioversion unless it was emergent does have some increased risk.   Discussed the case  with Dr. Raford from cardiology.  Given patient's chest x-ray shows some edema recommends try to get her euvolemic before trying to cardiovert her as she would most likely just go back into the A-fib.  Was okay with proceeding with diltiazem  as her last echocardiogram was reassuring.  Wanted to hold off on Amio for now unless patient gets hypotensive.  Patient was started on diltiazem  bolus, infusion per cardiology   Patient's troponin is slightly elevated more likely demand.  Potassium is reassuring mag is normal BNP elevated.  Will give a dose of Lasix .  Will discuss hospital team for admission    The patient is on the cardiac monitor to evaluate for evidence of arrhythmia and/or significant heart rate changes.      FINAL CLINICAL IMPRESSION(S) / ED DIAGNOSES   Final diagnoses:  Atrial fibrillation with rapid ventricular response (HCC)  Acute pulmonary edema (HCC)     Rx / DC Orders   ED Discharge Orders     None        Note:  This document was prepared using Dragon voice recognition software and may include unintentional dictation errors.   Ernest Ronal BRAVO, MD 10/13/24 2333  "

## 2024-10-13 NOTE — ED Notes (Signed)
 Called lab back to check status of labs, they advised they will look for samples and advise further

## 2024-10-13 NOTE — ED Notes (Addendum)
 Called lab to request that collected labs be processed, advised that they would process now

## 2024-10-14 ENCOUNTER — Encounter: Payer: Self-pay | Admitting: Family Medicine

## 2024-10-14 DIAGNOSIS — E785 Hyperlipidemia, unspecified: Secondary | ICD-10-CM | POA: Insufficient documentation

## 2024-10-14 DIAGNOSIS — I5031 Acute diastolic (congestive) heart failure: Secondary | ICD-10-CM | POA: Diagnosis not present

## 2024-10-14 DIAGNOSIS — I4891 Unspecified atrial fibrillation: Secondary | ICD-10-CM | POA: Diagnosis not present

## 2024-10-14 DIAGNOSIS — I160 Hypertensive urgency: Secondary | ICD-10-CM

## 2024-10-14 DIAGNOSIS — F32A Depression, unspecified: Secondary | ICD-10-CM | POA: Insufficient documentation

## 2024-10-14 DIAGNOSIS — I5021 Acute systolic (congestive) heart failure: Secondary | ICD-10-CM | POA: Insufficient documentation

## 2024-10-14 DIAGNOSIS — I10 Essential (primary) hypertension: Secondary | ICD-10-CM | POA: Insufficient documentation

## 2024-10-14 DIAGNOSIS — I509 Heart failure, unspecified: Secondary | ICD-10-CM

## 2024-10-14 DIAGNOSIS — E039 Hypothyroidism, unspecified: Secondary | ICD-10-CM

## 2024-10-14 LAB — CBC
HCT: 41.8 % (ref 36.0–46.0)
Hemoglobin: 14 g/dL (ref 12.0–15.0)
MCH: 32.9 pg (ref 26.0–34.0)
MCHC: 33.5 g/dL (ref 30.0–36.0)
MCV: 98.4 fL (ref 80.0–100.0)
Platelets: 138 K/uL — ABNORMAL LOW (ref 150–400)
RBC: 4.25 MIL/uL (ref 3.87–5.11)
RDW: 14 % (ref 11.5–15.5)
WBC: 6.6 K/uL (ref 4.0–10.5)
nRBC: 0 % (ref 0.0–0.2)

## 2024-10-14 LAB — BASIC METABOLIC PANEL WITH GFR
Anion gap: 14 (ref 5–15)
BUN: 16 mg/dL (ref 8–23)
CO2: 24 mmol/L (ref 22–32)
Calcium: 10 mg/dL (ref 8.9–10.3)
Chloride: 106 mmol/L (ref 98–111)
Creatinine, Ser: 0.66 mg/dL (ref 0.44–1.00)
GFR, Estimated: >60 mL/min
Glucose, Bld: 91 mg/dL (ref 70–99)
Potassium: 3.6 mmol/L (ref 3.5–5.1)
Sodium: 144 mmol/L (ref 135–145)

## 2024-10-14 LAB — MAGNESIUM: Magnesium: 2.1 mg/dL (ref 1.7–2.4)

## 2024-10-14 MED ORDER — ACETAMINOPHEN 650 MG RE SUPP: 650.0000 mg | Freq: Four times a day (QID) | RECTAL | Status: DC | PRN

## 2024-10-14 MED ORDER — METOPROLOL TARTRATE 25 MG PO TABS
25.0000 mg | ORAL_TABLET | Freq: Four times a day (QID) | ORAL | Status: DC
  Administered 2024-10-14 – 2024-10-18 (×14): 25 mg via ORAL
  Filled 2024-10-14 (×14): qty 1

## 2024-10-14 MED ORDER — POTASSIUM CHLORIDE 20 MEQ PO PACK
40.0000 meq | PACK | Freq: Once | ORAL | Status: AC
  Administered 2024-10-14: 40 meq via ORAL
  Filled 2024-10-14: qty 2

## 2024-10-14 MED ORDER — ACETAMINOPHEN 325 MG PO TABS
650.0000 mg | ORAL_TABLET | Freq: Four times a day (QID) | ORAL | Status: DC | PRN
  Administered 2024-10-15 – 2024-10-23 (×4): 650 mg via ORAL
  Filled 2024-10-14 (×4): qty 2

## 2024-10-14 MED ORDER — DONEPEZIL HCL 5 MG PO TABS
5.0000 mg | ORAL_TABLET | Freq: Every day | ORAL | Status: DC
  Administered 2024-10-14 – 2024-10-24 (×12): 5 mg via ORAL
  Filled 2024-10-14 (×12): qty 1

## 2024-10-14 MED ORDER — MAGNESIUM HYDROXIDE 400 MG/5ML PO SUSP
30.0000 mL | Freq: Every day | ORAL | Status: DC | PRN
  Administered 2024-10-22: 30 mL via ORAL
  Filled 2024-10-14: qty 30

## 2024-10-14 MED ORDER — MELATONIN 5 MG PO TABS: 10.0000 mg | ORAL_TABLET | Freq: Every evening | ORAL | Status: DC | PRN

## 2024-10-14 MED ORDER — ATORVASTATIN CALCIUM 20 MG PO TABS
40.0000 mg | ORAL_TABLET | Freq: Every day | ORAL | Status: DC
  Administered 2024-10-14 – 2024-10-25 (×12): 40 mg via ORAL
  Filled 2024-10-14 (×12): qty 2

## 2024-10-14 MED ORDER — APIXABAN 5 MG PO TABS
5.0000 mg | ORAL_TABLET | Freq: Two times a day (BID) | ORAL | Status: DC
  Administered 2024-10-14 – 2024-10-25 (×23): 5 mg via ORAL
  Filled 2024-10-14 (×23): qty 1

## 2024-10-14 MED ORDER — MELATONIN 5 MG PO TABS
10.0000 mg | ORAL_TABLET | Freq: Every day | ORAL | Status: DC
  Administered 2024-10-14 – 2024-10-24 (×12): 10 mg via ORAL
  Filled 2024-10-14 (×12): qty 2

## 2024-10-14 MED ORDER — TRAZODONE HCL 50 MG PO TABS
50.0000 mg | ORAL_TABLET | Freq: Every evening | ORAL | Status: DC | PRN
  Administered 2024-10-15: 50 mg via ORAL
  Filled 2024-10-14: qty 1

## 2024-10-14 MED ORDER — CITALOPRAM HYDROBROMIDE 20 MG PO TABS
20.0000 mg | ORAL_TABLET | Freq: Every day | ORAL | Status: DC
  Administered 2024-10-14 – 2024-10-25 (×12): 20 mg via ORAL
  Filled 2024-10-14 (×12): qty 1

## 2024-10-14 MED ORDER — LORATADINE 10 MG PO TABS
10.0000 mg | ORAL_TABLET | Freq: Every day | ORAL | Status: DC
  Administered 2024-10-14 – 2024-10-25 (×12): 10 mg via ORAL
  Filled 2024-10-14 (×12): qty 1

## 2024-10-14 MED ORDER — ONDANSETRON HCL 4 MG/2ML IJ SOLN: 4.0000 mg | Freq: Four times a day (QID) | INTRAMUSCULAR | Status: DC | PRN

## 2024-10-14 MED ORDER — FUROSEMIDE 10 MG/ML IJ SOLN
40.0000 mg | Freq: Two times a day (BID) | INTRAMUSCULAR | Status: DC
  Administered 2024-10-14 – 2024-10-15 (×3): 40 mg via INTRAVENOUS
  Filled 2024-10-14 (×3): qty 4

## 2024-10-14 MED ORDER — METOPROLOL TARTRATE 25 MG PO TABS
12.5000 mg | ORAL_TABLET | Freq: Four times a day (QID) | ORAL | Status: DC
  Administered 2024-10-14: 12.5 mg via ORAL
  Filled 2024-10-14: qty 1

## 2024-10-14 MED ORDER — POTASSIUM CHLORIDE 20 MEQ PO PACK
40.0000 meq | PACK | Freq: Once | ORAL | Status: DC
  Filled 2024-10-14: qty 2

## 2024-10-14 MED ORDER — METOPROLOL SUCCINATE ER 25 MG PO TB24
25.0000 mg | ORAL_TABLET | Freq: Every day | ORAL | Status: DC
  Administered 2024-10-14: 25 mg via ORAL
  Filled 2024-10-14: qty 1

## 2024-10-14 MED ORDER — ADULT MULTIVITAMIN W/MINERALS CH
1.0000 | ORAL_TABLET | Freq: Every day | ORAL | Status: DC
  Administered 2024-10-14 – 2024-10-25 (×12): 1 via ORAL
  Filled 2024-10-14 (×12): qty 1

## 2024-10-14 MED ORDER — LEVOTHYROXINE SODIUM 112 MCG PO TABS
112.0000 ug | ORAL_TABLET | Freq: Every day | ORAL | Status: DC
  Administered 2024-10-14 – 2024-10-25 (×12): 112 ug via ORAL
  Filled 2024-10-14 (×12): qty 1

## 2024-10-14 MED ORDER — ONDANSETRON HCL 4 MG PO TABS
4.0000 mg | ORAL_TABLET | Freq: Four times a day (QID) | ORAL | Status: DC | PRN
  Administered 2024-10-15: 4 mg via ORAL
  Filled 2024-10-14: qty 1

## 2024-10-14 MED ORDER — DIGOXIN 0.25 MG/ML IJ SOLN
0.2500 mg | Freq: Once | INTRAMUSCULAR | Status: AC
  Administered 2024-10-14: 0.25 mg via INTRAVENOUS
  Filled 2024-10-14: qty 2

## 2024-10-14 NOTE — Hospital Course (Signed)
 [name] is a 84 y.o. Caucasian female with medical history significant for essential hypertension, dyslipidemia, hypothyroidism, osteoarthritis and anxiety, who presented to the ER with acute onset of worsening dyspnea with associated chest pain felt this palpitations over the last couple of days  Upon arriving to hospital, she was found to have atrial fibrillation with RVR at 133, chest x-ray showed cardiomegaly with vascular congestion, proBNP 4836. Patient was placed on beta-blocker, diltiazem  drip, and furosemide .  Cardiology consult obtained.

## 2024-10-14 NOTE — Evaluation (Signed)
 Occupational Therapy Evaluation Patient Details Name: [name] MRN: [id number] DOB: 1940-10-26 Today's Date: 10/14/2024   History of Present Illness   Pt is an 84 year old female admitted with Afib with RVR, Acute on chronic diastolic CHF (congestive heart failure) (HCC)  Elevated troponin secondary to congestive heart failure, Hypertensive urgency.      PMH significant for essential hypertension, dyslipidemia, hypothyroidism, osteoarthritis and anxiety, dementia     Clinical Impressions Chart reviewed to date, pt greeted semi supine in bed, oriented to self only. She does follow one step directions with increased time and multi modal cues. Pt is a poor historian, will need to confirm home set up/PLOF. She reports she amb with no AD, performs ADLs with MOD I-I. Pt presents with deficits in strength, endurance, activity tolerance, balance, cognition affecting safe and optimal ADL completion. Please see further details below. She will benefit from acute OT to address functional deficits to facilitate optimal ADL/functional mobility performance. Pt is left in bed, safety maintained, all needs met. OT will follow.    HR to 142 bpm during mobiilty, between 110s-120s bpm at rest.      If plan is discharge home, recommend the following:   A little help with walking and/or transfers;A little help with bathing/dressing/bathroom;Supervision due to cognitive status     Functional Status Assessment   Patient has had a recent decline in their functional status and demonstrates the ability to make significant improvements in function in a reasonable and predictable amount of time.     Equipment Recommendations   Other (comment) (defer to next venue of care)     Recommendations for Other Services         Precautions/Restrictions   Precautions Precautions: Fall Recall of Precautions/Restrictions: Impaired Restrictions Weight Bearing Restrictions Per Provider Order: No      Mobility Bed Mobility Overal bed mobility: Needs Assistance Bed Mobility: Supine to Sit, Sit to Supine     Supine to sit: Mod assist, HOB elevated, +2 for safety/equipment Sit to supine: Mod assist, +2 for safety/equipment   General bed mobility comments: frequent multi modal cues for technique    Transfers Overall transfer level: Needs assistance Equipment used: 2 person hand held assist Transfers: Sit to/from Stand Sit to Stand: Min assist           General transfer comment: from regular bed, step by step multi modal cues for technique; MOD A +2 for lateral steps up the bed      Balance Overall balance assessment: Needs assistance Sitting-balance support: Feet supported Sitting balance-Leahy Scale: Good     Standing balance support: Bilateral upper extremity supported, During functional activity, Reliant on assistive device for balance Standing balance-Leahy Scale: Poor                             ADL either performed or assessed with clinical judgement   ADL Overall ADL's : Needs assistance/impaired     Grooming: Supervision/safety Grooming Details (indicate cue type and reason): anticipate             Lower Body Dressing: Maximal assistance   Toilet Transfer: Moderate assistance;+2 for safety/equipment;+2 for physical assistance Toilet Transfer Details (indicate cue type and reason): simulated                 Vision Patient Visual Report: No change from baseline       Perception  Praxis         Pertinent Vitals/Pain Pain Assessment Pain Assessment: No/denies pain     Extremity/Trunk Assessment Upper Extremity Assessment Upper Extremity Assessment: Generalized weakness   Lower Extremity Assessment Lower Extremity Assessment: Generalized weakness       Communication Communication Communication: No apparent difficulties   Cognition Arousal: Alert Behavior During Therapy: Restless Cognition: Cognition  impaired, No family/caregiver present to determine baseline, History of cognitive impairments   Orientation impairments: Place, Time, Situation Awareness: Intellectual awareness impaired, Online awareness impaired Memory impairment (select all impairments): Declarative long-term memory, Short-term memory, Working memory Attention impairment (select first level of impairment): Sustained attention Executive functioning impairment (select all impairments): Sequencing, Reasoning, Problem solving                   Following commands: Impaired Following commands impaired: Follows one step commands with increased time     Cueing  General Comments   Cueing Techniques: Verbal cues;Tactile cues;Visual cues  HR to 142 bpm during mobilty, at rest between 110s-120s bpm   Exercises Other Exercises Other Exercises: edu re role of OT, role of rehab   Shoulder Instructions      Home Living Family/patient expects to be discharged to:: Private residence Living Arrangements: Spouse/significant other Available Help at Discharge: Family Type of Home: House                           Additional Comments: pt is a poor historian, will need to confirm      Prior Functioning/Environment Prior Level of Function : Patient poor historian/Family not available             Mobility Comments: pt is a poor historian, she reports she amb with no AD ADLs Comments: pt reports she performs ADLs with MOD I, will need to confirm    OT Problem List: Decreased activity tolerance;Decreased knowledge of use of DME or AE;Decreased safety awareness;Impaired balance (sitting and/or standing);Decreased strength;Cardiopulmonary status limiting activity   OT Treatment/Interventions: Self-care/ADL training;Therapeutic exercise;Energy conservation;DME and/or AE instruction;Therapeutic activities;Patient/family education;Balance training      OT Goals(Current goals can be found in the care plan  section)   Acute Rehab OT Goals Patient Stated Goal: figure out what is going on OT Goal Formulation: With patient Time For Goal Achievement: 10/28/24 Potential to Achieve Goals: Good   OT Frequency:  Min 2X/week    Co-evaluation PT/OT/SLP Co-Evaluation/Treatment: Yes Reason for Co-Treatment: For patient/therapist safety;Necessary to address cognition/behavior during functional activity   OT goals addressed during session: ADL's and self-care      AM-PAC OT 6 Clicks Daily Activity     Outcome Measure Help from another person eating meals?: A Little Help from another person taking care of personal grooming?: A Little Help from another person toileting, which includes using toliet, bedpan, or urinal?: A Lot Help from another person bathing (including washing, rinsing, drying)?: A Lot Help from another person to put on and taking off regular upper body clothing?: A Lot Help from another person to put on and taking off regular lower body clothing?: A Lot 6 Click Score: 14   End of Session Nurse Communication: Mobility status  Activity Tolerance: Patient tolerated treatment well Patient left: in bed;with call bell/phone within reach;with bed alarm set  OT Visit Diagnosis: Other abnormalities of gait and mobility (R26.89);Muscle weakness (generalized) (M62.81);Other symptoms and signs involving cognitive function  Time: 1501-1510 OT Time Calculation (min): 9 min Charges:  OT General Charges $OT Visit: 1 Visit OT Evaluation $OT Eval Moderate Complexity: 1 Mod  Therisa Sheffield, OTD OTR/L  10/14/24, 3:46 PM

## 2024-10-14 NOTE — Assessment & Plan Note (Addendum)
-   We will continue Synthroid and check TSH level.

## 2024-10-14 NOTE — Evaluation (Signed)
 Physical Therapy Evaluation Patient Details Name: MELANE WINDHOLZ MRN: [id number] DOB: August 09, 1941 Today's Date: 10/14/2024  History of Present Illness  Pt is an 84 year old female admitted with Afib with RVR, Acute on chronic diastolic CHF (congestive heart failure) (HCC)  Elevated troponin secondary to congestive heart failure, Hypertensive urgency.      PMH significant for essential hypertension, dyslipidemia, hypothyroidism, osteoarthritis and anxiety, dementia  Clinical Impression  Patient admitted with the above. PTA, patient lives with husband and per patient she was ambulatory with no AD. No family present to confirm and patient is a poor historian. Required modA+2 for bed mobility and minA+2 to stand from EOB. Took sidesteps towards HOB with modA+2 and HHAx2. Patient will benefit from skilled PT services during acute stay to address listed deficits. Patient will benefit from ongoing therapy at discharge to maximize functional independence and safety.       If plan is discharge home, recommend the following: A lot of help with walking and/or transfers;A lot of help with bathing/dressing/bathroom;Assistance with cooking/housework;Direct supervision/assist for medications management;Direct supervision/assist for financial management;Assist for transportation;Help with stairs or ramp for entrance;Supervision due to cognitive status   Can travel by private vehicle   Yes    Equipment Recommendations Other (comment) (TBD)  Recommendations for Other Services       Functional Status Assessment Patient has had a recent decline in their functional status and demonstrates the ability to make significant improvements in function in a reasonable and predictable amount of time.     Precautions / Restrictions Precautions Precautions: Fall Recall of Precautions/Restrictions: Impaired Restrictions Weight Bearing Restrictions Per Provider Order: No      Mobility  Bed Mobility Overal bed  mobility: Needs Assistance Bed Mobility: Supine to Sit, Sit to Supine     Supine to sit: Mod assist, HOB elevated, +2 for safety/equipment Sit to supine: Mod assist, +2 for safety/equipment   General bed mobility comments: frequent multi modal cues for technique    Transfers Overall transfer level: Needs assistance Equipment used: 2 person hand held assist Transfers: Sit to/from Stand Sit to Stand: Min assist           General transfer comment: from regular bed, step by step multi modal cues for technique; MOD A +2 for lateral steps up the bed    Ambulation/Gait                  Stairs            Wheelchair Mobility     Tilt Bed    Modified Rankin (Stroke Patients Only)       Balance Overall balance assessment: Needs assistance Sitting-balance support: Feet supported Sitting balance-Leahy Scale: Good     Standing balance support: Bilateral upper extremity supported, During functional activity, Reliant on assistive device for balance Standing balance-Leahy Scale: Poor                               Pertinent Vitals/Pain Pain Assessment Pain Assessment: No/denies pain    Home Living Family/patient expects to be discharged to:: Private residence Living Arrangements: Spouse/significant other Available Help at Discharge: Family Type of Home: House             Additional Comments: pt is a poor historian, will need to confirm    Prior Function Prior Level of Function : Patient poor historian/Family not available  Mobility Comments: pt is a poor historian, she reports she amb with no AD ADLs Comments: pt reports she performs ADLs with MOD I, will need to confirm     Extremity/Trunk Assessment   Upper Extremity Assessment Upper Extremity Assessment: Defer to OT evaluation    Lower Extremity Assessment Lower Extremity Assessment: Generalized weakness       Communication   Communication Communication: No  apparent difficulties    Cognition Arousal: Alert Behavior During Therapy: Restless   PT - Cognitive impairments: History of cognitive impairments                         Following commands: Impaired Following commands impaired: Follows one step commands with increased time     Cueing       General Comments General comments (skin integrity, edema, etc.): HR to 142 bpm during mobilty, at rest between 110s-120s bpm    Exercises     Assessment/Plan    PT Assessment Patient needs continued PT services  PT Problem List Decreased strength;Decreased activity tolerance;Decreased balance;Decreased mobility;Decreased cognition;Decreased knowledge of use of DME;Decreased safety awareness;Decreased knowledge of precautions;Cardiopulmonary status limiting activity       PT Treatment Interventions DME instruction;Gait training;Functional mobility training;Therapeutic activities;Therapeutic exercise;Balance training;Neuromuscular re-education;Patient/family education    PT Goals (Current goals can be found in the Care Plan section)  Acute Rehab PT Goals Patient Stated Goal: did not state PT Goal Formulation: Patient unable to participate in goal setting Time For Goal Achievement: 10/28/24 Potential to Achieve Goals: Fair    Frequency Min 2X/week     Co-evaluation PT/OT/SLP Co-Evaluation/Treatment: Yes Reason for Co-Treatment: For patient/therapist safety;Necessary to address cognition/behavior during functional activity PT goals addressed during session: Mobility/safety with mobility;Balance OT goals addressed during session: ADL's and self-care       AM-PAC PT 6 Clicks Mobility  Outcome Measure Help needed turning from your back to your side while in a flat bed without using bedrails?: A Little Help needed moving from lying on your back to sitting on the side of a flat bed without using bedrails?: A Little Help needed moving to and from a bed to a chair (including a  wheelchair)?: A Lot Help needed standing up from a chair using your arms (e.g., wheelchair or bedside chair)?: A Lot Help needed to walk in hospital room?: A Lot Help needed climbing 3-5 steps with a railing? : A Lot 6 Click Score: 14    End of Session   Activity Tolerance: Patient tolerated treatment well Patient left: in bed;with call bell/phone within reach;with bed alarm set Nurse Communication: Mobility status PT Visit Diagnosis: Unsteadiness on feet (R26.81);Muscle weakness (generalized) (M62.81);Other abnormalities of gait and mobility (R26.89)    Time: 8498-8487 PT Time Calculation (min) (ACUTE ONLY): 11 min   Charges:   PT Evaluation $PT Eval Moderate Complexity: 1 Mod   PT General Charges $$ ACUTE PT VISIT: 1 Visit         Maryanne Finder, PT, DPT Physical Therapist - [facility]  [facility]   Klaudia Beirne A Jadon Harbaugh 10/14/2024, 4:08 PM

## 2024-10-14 NOTE — Assessment & Plan Note (Signed)
-   This likely diastolic as her last 2D echo on 10/05/2023 revealed an EF of 50 to 55% with indeterminate left ventricular diastolic parameter.  The left atrium was severely dilated and she had mild to moderate tricuspid regurgitation and mild mitral regurgitation with bicuspid aortic valve. - She will be diuresed with IV Lasix . - Cardiology consult to be obtained as mentioned above. - Will follow serial troponins

## 2024-10-14 NOTE — Assessment & Plan Note (Addendum)
-   Will continue Celexa  and trazodone .

## 2024-10-14 NOTE — Progress Notes (Signed)
" °  Progress Note   Patient: [name] FMW:[id number] DOB: [birth year] DOA: 10/13/2024     1 DOS: the patient was seen and examined on 10/14/2024   Brief hospital course: [name] is a 84 y.o. Caucasian female with medical history significant for essential hypertension, dyslipidemia, hypothyroidism, osteoarthritis and anxiety, who presented to the ER with acute onset of worsening dyspnea with associated chest pain felt this palpitations over the last couple of days  Upon arriving to hospital, she was found to have atrial fibrillation with RVR at 133, chest x-ray showed cardiomegaly with vascular congestion, proBNP 4836. Patient was placed on beta-blocker, diltiazem  drip, and furosemide .  Cardiology consult obtained.   Principal Problem:   Atrial fibrillation with RVR (HCC) Active Problems:   Acute CHF (congestive heart failure) (HCC)   Hypertensive urgency   Hypothyroidism   Dyslipidemia   Anxiety and depression   Essential hypertension   Assessment and Plan: *Chronic atrial fibrillation with RVR (HCC) Patient still has significant tachycardia, followed by cardiology, continue beta-blocker and diltiazem  infusion.  Continue Eliquis .  Acute on chronic diastolic CHF (congestive heart failure) (HCC) Elevated troponin secondary to congestive heart failure. Hypertension urgency. - This likely diastolic as her last 2D echo on 10/05/2023 revealed an EF of 50 to 55% with indeterminate left ventricular diastolic parameter.  The left atrium was severely dilated and she had mild to moderate tricuspid regurgitation and mild mitral regurgitation with bicuspid aortic valve. Appreciate cardiology consult, continue diuretics, still has significant volume overload.  Mild thrombocytopenia. Continue to follow.  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 Diet and exercise.  Hypothyroidism - We will continue Synthroid  and check TSH level.  Essential hypertension - Will continue  Toprol -XL.  Anxiety and depression - Will continue Celexa  and trazodone .  Dyslipidemia - Will continue statin therapy.       Subjective:  Patient is diuresing well, feels short of breath better today.  Physical Exam: Vitals:   10/14/24 0613 10/14/24 0630 10/14/24 0800 10/14/24 0907  BP:  (!) 125/96 (!) 125/98 108/82  Pulse:  71 (!) 130 (!) 120  Resp:  17 12   Temp: 98.3 F (36.8 C)  97.9 F (36.6 C)   TempSrc: Oral  Oral   SpO2:  97% 93%   Weight:      Height:       General exam: Appears calm and comfortable  Respiratory system: Decreased breath sounds. Respiratory effort normal. Cardiovascular system: Irregular and tachycardic. No JVD, murmurs, rubs, gallops or clicks. No pedal edema. Gastrointestinal system: Abdomen is nondistended, soft and nontender. No organomegaly or masses felt. Normal bowel sounds heard. Central nervous system: Alert and oriented. No focal neurological deficits. Extremities: Symmetric 5 x 5 power. Skin: No rashes, lesions or ulcers Psychiatry: Judgement and insight appear normal. Mood & affect appropriate.    Data Reviewed:  Chest x-ray and lab results reviewed  Family Communication: Husband updated at bedside  Disposition: Status is: Inpatient Remains inpatient appropriate because: Severity of disease, IV treatment     Time spent: 50 minutes  Author: Murvin Mana, MD 10/14/2024 11:05 AM  For on call review [email or web address].    "

## 2024-10-14 NOTE — H&P (Addendum)
 "     [facility]   PATIENT NAME: [name]    MR#:  [id number]  DATE OF BIRTH:  1941-07-11  DATE OF ADMISSION:  10/13/2024  PRIMARY CARE PHYSICIAN: Antonette Angeline ORN, NP   Patient is coming from: Home  REQUESTING/REFERRING PHYSICIAN: Ernest Shuck, MD  CHIEF COMPLAINT:   Chief Complaint  Patient presents with   Shortness of Breath   Chest Pain    HISTORY OF PRESENT ILLNESS:  Jocelyn MCKENNEY is a 84 y.o. Caucasian female with medical history significant for essential hypertension, dyslipidemia, hypothyroidism, osteoarthritis and anxiety, who presented to the ER with acute onset of worsening dyspnea with associated chest pain felt this palpitations over the last couple of days.  She denied any significant worsening lower extremity edema.  She admits to paroxysmal nocturnal dyspnea and dyspnea on exertion and has no worsening orthopnea.  No fever or chills.  No cough or wheezing.  No nausea or vomiting or abdominal pain.  No bleeding diathesis.  ED Course: When the patient came to the ER, BP was 162/124 and heart rate 133 with a respiratory rate of 24 and Pulsoxymeter is 97% on room air initially.  Labs revealed borderline potassium of 3.5 and otherwise unremarkable BMP.  Troponin was 6.1 with otherwise unremarkable CMP.  proBNP was 4836 and high-sensitivity troponin T was 21.  CBC was normal except for pulm will cytopenia of 139.. EKG as reviewed by me : EKG showed A-fib with RVR requirement atrial fibrillation with RVR of 144 atrial fibrillation with RVR of 144 with pleuritic with Q waves anteroseptally. Imaging: Portable chest x-ray showed the following: 1. Cardiomegaly with vascular congestion and interstitial prominence, likely early interstitial edema. 2. Suspected small bilateral effusions.  There are patient was given IV Cardizem  15 mg bolus twice followed by IV Cardizem  drip in addition to 40 mg of IV Lasix .  She will be admitted to a progressive unit bed for further  evaluation and management. PAST MEDICAL HISTORY:   Past Medical History:  Diagnosis Date   Allergy    Anemia    Anxiety    Arthritis    Cancer (HCC)    skin cancer   Hyperlipidemia    Hypertension    Hypothyroidism    Sciatica    Sleep apnea    does not use cpap   Thrombocytopenia    Thyroid  disease   -COPD on home O2  PAST SURGICAL HISTORY:   Past Surgical History:  Procedure Laterality Date   ABDOMINAL HYSTERECTOMY     APPENDECTOMY     BACK SURGERY     lower   BILATERAL CARPAL TUNNEL RELEASE     BREAST CYST ASPIRATION Left    neg   CARDIOVERSION N/A 10/08/2023   Procedure: CARDIOVERSION;  Surgeon: Perla Evalene PARAS, MD;  Location: [facility] ORS;  Service: Cardiovascular;  Laterality: N/A;   COLONOSCOPY     COLONOSCOPY WITH PROPOFOL  N/A 04/23/2021   Procedure: COLONOSCOPY WITH PROPOFOL ;  Surgeon: Janalyn Keene NOVAK, MD;  Location: [facility];  Service: Endoscopy;  Laterality: N/A;   ENTROPIAN REPAIR Left 06/08/2020   Procedure: ENTROPION REPAIR, SUTURES ENTROPION REPAIR, EXTENSIVE LEFT;  Surgeon: Ashley Greig HERO, MD;  Location: [facility];  Service: Ophthalmology;  Laterality: Left;   EYE SURGERY     FRACTURE SURGERY     HERNIA REPAIR     INTRAMEDULLARY (IM) NAIL INTERTROCHANTERIC Left 10/04/2023   Procedure: INTRAMEDULLARY (IM) NAIL INTERTROCHANTERIC;  Surgeon: Edie Norleen PARAS, MD;  Location: [facility];  Service: Orthopedics;  Laterality: Left;   JOINT REPLACEMENT     knees   KNEE SURGERY Bilateral    KNEE SURGERY     OPEN REDUCTION INTERNAL FIXATION (ORIF) DISTAL RADIAL FRACTURE Left 06/23/2019   Procedure: OPEN REDUCTION INTERNAL FIXATION (ORIF) DISTAL RADIAL FRACTURE;  Surgeon: Kathlynn Sharper, MD;  Location: [facility] ORS;  Service: Orthopedics;  Laterality: Left;   PARATHYROIDECTOMY  04/27/2020   salivary stone remove     SPINAL CORD STIMULATOR IMPLANT Right    SPINAL CORD STIMULATOR REMOVAL N/A 07/28/2022   Procedure: REMOVAL OF SPINAL CORD STIMULATOR;   Surgeon: Clois Fret, MD;  Location: [facility] ORS;  Service: Neurosurgery;  Laterality: N/A;   THYROIDECTOMY Right 04/27/2020   Procedure: PARATHYROIDECTOMY;  Surgeon: Gladis Cough, MD;  Location: [facility] OR[facility];  Service: General;  Laterality: Right;   TOTAL KNEE REVISION Right 08/13/2015   Procedure: TOTAL KNEE REVISION;  Surgeon: Cough Car, MD;  Location: [facility] OR[facility];  Service: Orthopedics;  Laterality: Right;    SOCIAL HISTORY:   Social History   Tobacco Use   Smoking status: Every Day    Current packs/day: 0.50    Average packs/day: 0.5 packs/day for 40.0 years (20.0 ttl pk-yrs)    Types: Cigarettes   Smokeless tobacco: Never  Substance Use Topics   Alcohol  use: No    FAMILY HISTORY:   Family History  Problem Relation Age of Onset   Dementia Mother    Dementia Sister    Heart attack Brother    Diabetes Brother    Breast cancer Paternal Aunt    Heart attack Son    Hypercalcemia Neg Hx    Cancer Neg Hx     DRUG ALLERGIES:  Allergies[1]  REVIEW OF SYSTEMS:   ROS As per history of present illness. All pertinent systems were reviewed above. Constitutional, HEENT, cardiovascular, respiratory, GI, GU, musculoskeletal, neuro, psychiatric, endocrine, integumentary and hematologic systems were reviewed and are otherwise negative/unremarkable except for positive findings mentioned above in the HPI.   MEDICATIONS AT HOME:   Prior to Admission medications  Medication Sig Start Date End Date Taking? Authorizing Provider  acetaminophen  (TYLENOL ) 500 MG tablet Take 500 mg by mouth every 6 (six) hours as needed for moderate pain.     [provider]  amiodarone  (PACERONE ) 200 MG tablet Take 1 tablet (200 mg total) by mouth daily. Patient not taking: Reported on 04/13/2024 10/15/23 11/14/23  Trudy Anthony HERO, MD  amoxicillin -clavulanate (AUGMENTIN ) 875-125 MG tablet Take 1 tablet by mouth 2 (two) times daily. Patient not taking: Reported on 04/13/2024 12/18/23   Antonette Angeline ORN, NP  apixaban  (ELIQUIS ) 5 MG TABS tablet Take 1 tablet (5 mg total) by mouth 2 (two) times daily. 01/13/24   Gerard Frederick, NP  apixaban  (ELIQUIS ) 5 MG TABS tablet Take 1 tablet (5 mg total) by mouth 2 (two) times daily. 01/13/24   Gerard Frederick, NP  atorvastatin  (LIPITOR) 40 MG tablet Take 40 mg by mouth daily.    [provider]  cetirizine  (ZYRTEC ) 10 MG tablet Take 10 mg by mouth daily.    [provider]  citalopram  (CELEXA ) 20 MG tablet TAKE 1 TABLET(20 MG) BY MOUTH DAILY 07/11/24   Antonette Angeline ORN, NP  donepezil  (ARICEPT  ODT) 5 MG disintegrating tablet DISSOLVE 1 TABLET(5 MG) ON THE TONGUE AT BEDTIME 09/27/24   Antonette Angeline ORN, NP  HYDROcodone -acetaminophen  (NORCO/VICODIN) 5-325 MG tablet Take 1 tablet by mouth every 6 (six) hours as needed for severe pain (pain score  7-10). Patient not taking: Reported on 04/15/2024 10/09/23   Kip Lynwood Double, PA-C  levothyroxine  (SYNTHROID ) 112 MCG tablet Take 1 tablet (112 mcg total) by mouth daily. 06/15/24   Antonette Angeline ORN, NP  Melatonin 10 MG CAPS Take by mouth at bedtime.    [provider]  metoprolol  succinate (TOPROL -XL) 25 MG 24 hr tablet TAKE 1 TABLET BY MOUTH EVERY DAY WITH OR IMMEDIATELY FOLLOWING A MEAL 04/15/24   Gollan, Timothy J, MD  Multiple Vitamin (MULTIVITAMIN) capsule Take 1 capsule by mouth daily.    [provider]  predniSONE  (DELTASONE ) 20 MG tablet Take daily with food. Start with 60mg  (3 pills) x 2 days, then reduce to 40mg  (2 pills) x 2 days, then 20mg  (1 pill) x 3 days Patient not taking: Reported on 04/15/2024 01/05/24   Edman Marsa PARAS, DO  traZODone  (DESYREL ) 50 MG tablet TAKE 1 TABLET(50 MG) BY MOUTH AT BEDTIME AS NEEDED FOR SLEEP 07/11/24   Antonette Angeline ORN, NP      VITAL SIGNS:  Blood pressure (!) 129/100, pulse (!) 119, temperature 97.6 F (36.4 C), temperature source Oral, resp. rate 16, height 5' 3 (1.6 m), weight 77.8 kg, SpO2 95%.  PHYSICAL EXAMINATION:   Physical Exam  GENERAL:  84 y.o.-year-old patient lying in the bed with mild respiratory distress with conversational dyspnea. EYES: Pupils equal, round, reactive to light and accommodation. No scleral icterus. Extraocular muscles intact.  HEENT: Head atraumatic, normocephalic. Oropharynx and nasopharynx clear.  NECK:  Supple, no jugular venous distention. No thyroid  enlargement, no tenderness.  LUNGS: Normal breath sounds bilaterally, no wheezing, rales,rhonchi or crepitation. No use of accessory muscles of respiration.  CARDIOVASCULAR: Irregularly irregular tachycardic rhythm, S1, S2 normal. No murmurs, rubs, or gallops.  ABDOMEN: Soft, nondistended, nontender. Bowel sounds present. No organomegaly or mass.  EXTREMITIES: Trace bilateral lower extremity edema, with no cyanosis, or clubbing.  NEUROLOGIC: Cranial nerves II through XII are intact. Muscle strength 5/5 in all extremities. Sensation intact. Gait not checked.  PSYCHIATRIC: The patient is alert and oriented x 3.  Normal affect and good eye contact. SKIN: No obvious rash, lesion, or ulcer.   LABORATORY PANEL:   CBC Recent Labs  Lab 10/14/24 0410  WBC 6.6  HGB 14.0  HCT 41.8  PLT 138*   ------------------------------------------------------------------------------------------------------------------  Chemistries  Recent Labs  Lab 10/13/24 2252 10/14/24 0410  NA 143 144  K 3.5 3.6  CL 110 106  CO2 23 24  GLUCOSE 99 91  BUN 19 16  CREATININE 0.69 0.66  CALCIUM  9.3 10.0  MG 2.1 2.1  AST 37  --   ALT 35  --   ALKPHOS 80  --   BILITOT 1.0  --    ------------------------------------------------------------------------------------------------------------------  Cardiac Enzymes No results for input(s): TROPONINI in the last 168 hours. ------------------------------------------------------------------------------------------------------------------  RADIOLOGY:  DG Chest Portable 1 View Result Date:  10/13/2024 EXAM: 1 VIEW(S) XRAY OF THE CHEST 10/13/2024 09:24:00 PM COMPARISON: 10/03/2023 CLINICAL HISTORY: sob Shortness of breath. FINDINGS: LUNGS AND PLEURA: Interstitial prominence, likely early interstitial edema. Suspect small bilateral effusions. No focal pulmonary opacity. No pneumothorax. HEART AND MEDIASTINUM: Cardiomegaly with vascular congestion. Aortic atherosclerosis. BONES AND SOFT TISSUES: No acute osseous abnormality. IMPRESSION: 1. Cardiomegaly with vascular congestion and interstitial prominence, likely early interstitial edema. 2. Suspected small bilateral effusions. Electronically signed by: Franky Crease MD 10/13/2024 09:27 PM EST RP Workstation: [id number]      IMPRESSION AND PLAN:  Assessment and Plan: * Atrial fibrillation with RVR (HCC) -  This is likely the culprit for her  chest pain. - The patient will be admitted to a progressive unit bed. - Will continue her on IV Cardizem  drip. - With borderline soft blood pressure we will utilize IV amiodarone . - Will continue her Eliquis . - Cardiology consult to be obtained. - Dr. Raford was notified about the patient and I sent a message to [facility].  Acute CHF (congestive heart failure) (HCC) - This likely diastolic as her last 2D echo on 10/05/2023 revealed an EF of 50 to 55% with indeterminate left ventricular diastolic parameter.  The left atrium was severely dilated and she had mild to moderate tricuspid regurgitation and mild mitral regurgitation with bicuspid aortic valve. - She will be diuresed with IV Lasix . - Cardiology consult to be obtained as mentioned above. - Will follow serial troponins  Hypertensive urgency - This is likely contributing to her acute CHF. - Will continue antihypertensive therapy. - She has been placed on IV Cardizem  drip with improvement of her blood pressure.   Hypothyroidism - We will continue Synthroid  and check TSH level.  Essential hypertension - Will continue  Toprol -XL.  Anxiety and depression - Will continue Celexa  and trazodone .  Dyslipidemia - Will continue statin therapy.   DVT prophylaxis: Eliquis . Advanced Care Planning:  Code Status: full code. Family Communication:  The plan of care was discussed in details with the patient (and family). I answered all questions. The patient agreed to proceed with the above mentioned plan. Further management will depend upon hospital course. Disposition Plan: Back to previous home environment Consults called: Radiology. All the records are reviewed and case discussed with ED provider.  Status is: Inpatient   At the time of the admission, it appears that the appropriate admission status for this patient is inpatient.  This is judged to be reasonable and necessary in order to provide the required intensity of service to ensure the patient's safety given the presenting symptoms, physical exam findings and initial radiographic and laboratory data in the context of comorbid conditions.  The patient requires inpatient status due to high intensity of service, high risk of further deterioration and high frequency of surveillance required.  I certify that at the time of admission, it is my clinical judgment that the patient will require inpatient hospital care extending more than 2 midnights.                            Dispo: The patient is from: Home              Anticipated d/c is to: Home              Patient currently is not medically stable to d/c.              Difficult to place patient: No  Madison DELENA Peaches M.D on 10/14/2024 at 5:55 AM  [facility]   From 7 PM-7 AM, contact night-coverage [email or web address]  CC: Primary care physician; Antonette Angeline ORN, NP     [1]  Allergies Allergen Reactions   Codeine Nausea Only    Tolerates oxycodone    Morphine  Nausea And Vomiting   Ace Inhibitors Cough   Adhesive [Tape] Other (See Comments) and Rash    whelps   "

## 2024-10-14 NOTE — Assessment & Plan Note (Signed)
-   This is likely contributing to her acute CHF. - Will continue antihypertensive therapy. - She has been placed on IV Cardizem  drip with improvement of her blood pressure.

## 2024-10-14 NOTE — ED Notes (Signed)
 Called [facility] to initiate cardiac monitoring.

## 2024-10-14 NOTE — Assessment & Plan Note (Signed)
 Will continue statin therapy

## 2024-10-14 NOTE — Assessment & Plan Note (Signed)
-   This is likely the culprit for her  chest pain. - The patient will be admitted to a progressive unit bed. - Will continue her on IV Cardizem  drip. - With borderline soft blood pressure we will utilize IV amiodarone . - Will continue her Eliquis . - Cardiology consult to be obtained. - Dr. Raford was notified about the patient and I sent a message to [facility].

## 2024-10-14 NOTE — Assessment & Plan Note (Signed)
Will continue Toprol-XL.

## 2024-10-14 NOTE — Consult Note (Signed)
 "  Cardiology Consultation:   Patient ID: [name]; [id number]; 12-27-1940   Admit date: 10/13/2024 Date of Consult: 10/14/2024  Primary Care Provider: Antonette Angeline ORN, NP Primary Cardiologist: Perla Primary Electrophysiologist:  None   Patient Profile:   [name] is a 84 y.o. female with a hx of postoperative A-fib following left hip surgery, bicuspid aortic valve, HTN, HLD, hypothyroidism, arthritis, dementia, anxiety, and OSA not on CPAP  who is being seen today for the evaluation of recurrent Afib and volume overload at the request of Dr. Lawence.  History of Present Illness:   [name] suffered a mechanical fall, slipping on black ice in 09/2023 leading to left hip fracture undergoing ORIF.  Postoperative course was notable for A-fib managed with metoprolol , amiodarone , and subsequent cardioversion.  Echo in 09/2023 showed an EF of 50 to 55%, mild LVH, normal RV systolic function, ventricular cavity size, and RVSP, severely dilated left atrium, mild mitral regurgitation, mild to moderate tricuspid regurgitation, bicuspid aortic valve with sclerosis without evidence of stenosis, and a normal CVP.  She was most recently seen in the office in 03/2024 and was maintaining sinus rhythm with a bradycardic rate with recommendation to reduce Toprol -XL to 25 mg daily and continue apixaban .   She presented to [facility] on 10/13/2024 with reported increase in shortness of breath and palpitations as noted in the H&P.  Further details are unclear as patient does not know why she is in the ER and no family is present.  Denies chest pain or lower extremity swelling.  She is unclear what medications she is taking at home.  Upon arrival, BP was 162/124 with a heart rate of 133 bpm, oxygen  saturation 95% on room air, and she was afebrile.  In the ER blood pressure has ranged from the 120s to 150s mmHg systolic with heart rates largely in the 130s bpm.  EKG showed A-fib with RVR, 144 bpm, nonspecific  ST-T changes.  Labs: high-sensitivity troponin 21, not trended, proBNP 4836, WBC 6.3, Hgb 13.2, PLT 139, potassium 3.5, BUN 19, serum creatinine 0.69, AST/ALT normal, magnesium  2.1.  Chest x-ray with cardiomegaly with vascular congestion and interstitial prominence felt to represent early interstitial edema as well as a possible small bilateral pleural effusions.  In the ER she received IV diltiazem  15 mg x 2 and was placed on a diltiazem  drip for A-fib with RVR.  She received IV Lasix  40 mg times one and was placed on IV Lasix  40 mg twice daily upon admission along with KCl and PTA thyroxine and apixaban .  TSH in 05/2024 suppressed at 0.28 with patient currently taking levothyroxine  112 mcg.  Documented urine output in the ER 3.9 L.  Currently, she knows that she is in the hospital though is unclear why she is here or for how long she has been here.  No family at bedside.  She remains in A-fib with ventricular rates in the 1 teens to 140s bpm.    Past Medical History:  Diagnosis Date   Allergy    Anemia    Anxiety    Arthritis    Bicuspid aortic valve    Cancer (HCC)    skin cancer   Hyperlipidemia    Hypertension    Hypothyroidism    PAF (paroxysmal atrial fibrillation) (HCC)    Sciatica    Sleep apnea    does not use cpap   Thrombocytopenia     Past Surgical History:  Procedure Laterality Date   ABDOMINAL HYSTERECTOMY  APPENDECTOMY     BACK SURGERY     lower   BILATERAL CARPAL TUNNEL RELEASE     BREAST CYST ASPIRATION Left    neg   CARDIOVERSION N/A 10/08/2023   Procedure: CARDIOVERSION;  Surgeon: Perla Evalene PARAS, MD;  Location: [facility];  Service: Cardiovascular;  Laterality: N/A;   COLONOSCOPY     COLONOSCOPY WITH PROPOFOL  N/A 04/23/2021   Procedure: COLONOSCOPY WITH PROPOFOL ;  Surgeon: Janalyn Keene NOVAK, MD;  Location: [facility];  Service: Endoscopy;  Laterality: N/A;   ENTROPIAN REPAIR Left 06/08/2020   Procedure: ENTROPION REPAIR, SUTURES ENTROPION REPAIR,  EXTENSIVE LEFT;  Surgeon: Ashley Greig HERO, MD;  Location: [facility];  Service: Ophthalmology;  Laterality: Left;   EYE SURGERY     FRACTURE SURGERY     HERNIA REPAIR     INTRAMEDULLARY (IM) NAIL INTERTROCHANTERIC Left 10/04/2023   Procedure: INTRAMEDULLARY (IM) NAIL INTERTROCHANTERIC;  Surgeon: Edie Norleen PARAS, MD;  Location: [facility];  Service: Orthopedics;  Laterality: Left;   JOINT REPLACEMENT     knees   KNEE SURGERY Bilateral    KNEE SURGERY     OPEN REDUCTION INTERNAL FIXATION (ORIF) DISTAL RADIAL FRACTURE Left 06/23/2019   Procedure: OPEN REDUCTION INTERNAL FIXATION (ORIF) DISTAL RADIAL FRACTURE;  Surgeon: Kathlynn Sharper, MD;  Location: [facility];  Service: Orthopedics;  Laterality: Left;   PARATHYROIDECTOMY  04/27/2020   salivary stone remove     SPINAL CORD STIMULATOR IMPLANT Right    SPINAL CORD STIMULATOR REMOVAL N/A 07/28/2022   Procedure: REMOVAL OF SPINAL CORD STIMULATOR;  Surgeon: Clois Fret, MD;  Location: [facility];  Service: Neurosurgery;  Laterality: N/A;   THYROIDECTOMY Right 04/27/2020   Procedure: PARATHYROIDECTOMY;  Surgeon: Gladis Cough, MD;  Location: [facility];  Service: General;  Laterality: Right;   TOTAL KNEE REVISION Right 08/13/2015   Procedure: TOTAL KNEE REVISION;  Surgeon: Cough Car, MD;  Location: [facility];  Service: Orthopedics;  Laterality: Right;     Home Meds: Prior to Admission medications  Medication Sig Start Date End Date Taking? Authorizing Provider  acetaminophen  (TYLENOL ) 500 MG tablet Take 500 mg by mouth every 6 (six) hours as needed for moderate pain.     [provider]  amiodarone  (PACERONE ) 200 MG tablet Take 1 tablet (200 mg total) by mouth daily. Patient not taking: Reported on 04/13/2024 10/15/23 11/14/23  Trudy Anthony HERO, MD  amoxicillin -clavulanate (AUGMENTIN ) 875-125 MG tablet Take 1 tablet by mouth 2 (two) times daily. Patient not taking: Reported on 04/13/2024 12/18/23   Antonette Angeline ORN, NP  apixaban   (ELIQUIS ) 5 MG TABS tablet Take 1 tablet (5 mg total) by mouth 2 (two) times daily. 01/13/24   Gerard Frederick, NP  apixaban  (ELIQUIS ) 5 MG TABS tablet Take 1 tablet (5 mg total) by mouth 2 (two) times daily. 01/13/24   Gerard Frederick, NP  atorvastatin  (LIPITOR) 40 MG tablet Take 40 mg by mouth daily.    [provider]  cetirizine  (ZYRTEC ) 10 MG tablet Take 10 mg by mouth daily.    [provider]  citalopram  (CELEXA ) 20 MG tablet TAKE 1 TABLET(20 MG) BY MOUTH DAILY 07/11/24   Antonette Angeline ORN, NP  donepezil  (ARICEPT  ODT) 5 MG disintegrating tablet DISSOLVE 1 TABLET(5 MG) ON THE TONGUE AT BEDTIME 09/27/24   Antonette Angeline ORN, NP  HYDROcodone -acetaminophen  (NORCO/VICODIN) 5-325 MG tablet Take 1 tablet by mouth every 6 (six) hours as needed for severe pain (pain score 7-10). Patient not taking: Reported on 04/15/2024 10/09/23  Kip Lynwood Double, PA-C  levothyroxine  (SYNTHROID ) 112 MCG tablet Take 1 tablet (112 mcg total) by mouth daily. 06/15/24   Antonette Angeline ORN, NP  Melatonin 10 MG CAPS Take by mouth at bedtime.    [provider]  metoprolol  succinate (TOPROL -XL) 25 MG 24 hr tablet TAKE 1 TABLET BY MOUTH EVERY DAY WITH OR IMMEDIATELY FOLLOWING A MEAL 04/15/24   Gollan, Timothy J, MD  Multiple Vitamin (MULTIVITAMIN) capsule Take 1 capsule by mouth daily.    [provider]  predniSONE  (DELTASONE ) 20 MG tablet Take daily with food. Start with 60mg  (3 pills) x 2 days, then reduce to 40mg  (2 pills) x 2 days, then 20mg  (1 pill) x 3 days Patient not taking: Reported on 04/15/2024 01/05/24   Edman Marsa PARAS, DO  traZODone  (DESYREL ) 50 MG tablet TAKE 1 TABLET(50 MG) BY MOUTH AT BEDTIME AS NEEDED FOR SLEEP 07/11/24   Antonette Angeline ORN, NP    Inpatient Medications: Scheduled Meds:  apixaban   5 mg Oral BID   atorvastatin   40 mg Oral Daily   citalopram   20 mg Oral Daily   donepezil   5 mg Oral QHS   furosemide   40 mg Intravenous Q12H   levothyroxine   112 mcg Oral Q0600    loratadine   10 mg Oral Daily   melatonin  10 mg Oral QHS   metoprolol  tartrate  12.5 mg Oral Q6H   multivitamin with minerals  1 tablet Oral Daily   potassium chloride   40 mEq Oral Once   Continuous Infusions:  diltiazem  (CARDIZEM ) infusion 15 mg/hr (10/14/24 0859)   PRN Meds: acetaminophen  **OR** acetaminophen , magnesium  hydroxide, ondansetron  **OR** ondansetron  (ZOFRAN ) IV, traZODone   Allergies:  Allergies[1]  Social History:   Social History   Socioeconomic History   Marital status: Married    Spouse name: ROBERT   Number of children: 4   Years of education: Not on file   Highest education level: GED or equivalent  Occupational History   Occupation: retired   Occupation: Retired  Tobacco Use   Smoking status: Every Day    Current packs/day: 0.50    Average packs/day: 0.5 packs/day for 40.0 years (20.0 ttl pk-yrs)    Types: Cigarettes   Smokeless tobacco: Never  Vaping Use   Vaping status: Never Used  Substance and Sexual Activity   Alcohol  use: No   Drug use: No   Sexual activity: Not Currently  Other Topics Concern   Not on file  Social History Narrative   Not on file   Social Drivers of Health   Tobacco Use: High Risk (04/15/2024)   Patient History    Smoking Tobacco Use: Every Day    Smokeless Tobacco Use: Never    Passive Exposure: Not on file  Financial Resource Strain: Low Risk (04/12/2024)   Overall Financial Resource Strain (CARDIA)    Difficulty of Paying Living Expenses: Not very hard  Food Insecurity: Food Insecurity Present (04/12/2024)   Epic    Worried About Programme Researcher, Broadcasting/film/video in the Last Year: Sometimes true    Ran Out of Food in the Last Year: Never true  Transportation Needs: Unknown (04/12/2024)   Epic    Lack of Transportation (Medical): No    Lack of Transportation (Non-Medical): Not on file  Physical Activity: Inactive (01/01/2024)   Exercise Vital Sign    Days of Exercise per Week: 0 days    Minutes of Exercise per Session: 30 min   Stress: No Stress Concern Present (04/12/2024)  [employer] of Occupational Health - Occupational Stress Questionnaire    Feeling of Stress: Only a little  Social Connections: Moderately Integrated (04/12/2024)   Social Connection and Isolation Panel    Frequency of Communication with Friends and Family: Never    Frequency of Social Gatherings with Friends and Family: Never    Attends Religious Services: More than 4 times per year    Active Member of Clubs or Organizations: Yes    Attends Banker Meetings: More than 4 times per year    Marital Status: Married  Catering Manager Violence: Not At Risk (01/01/2024)   Humiliation, Afraid, Rape, and Kick questionnaire    Fear of Current or Ex-Partner: No    Emotionally Abused: No    Physically Abused: No    Sexually Abused: No  Depression (PHQ2-9): Low Risk (01/01/2024)   Depression (PHQ2-9)    PHQ-2 Score: 0  Alcohol  Screen: Low Risk (01/01/2024)   Alcohol  Screen    Last Alcohol  Screening Score (AUDIT): 0  Housing: Low Risk (04/12/2024)   Epic    Unable to Pay for Housing in the Last Year: No    Number of Times Moved in the Last Year: 0    Homeless in the Last Year: No  Utilities: Not At Risk (01/01/2024)   [employer] Utilities    Threatened with loss of utilities: No  Health Literacy: Adequate Health Literacy (05/26/2023)   B1300 Health Literacy    Frequency of need for help with medical instructions: Rarely     Family History:   Family History  Problem Relation Age of Onset   Dementia Mother    Dementia Sister    Heart attack Brother    Diabetes Brother    Breast cancer Paternal Aunt    Heart attack Son    Hypercalcemia Neg Hx    Cancer Neg Hx     ROS:  Review of Systems  Unable to perform ROS: Dementia      Physical Exam/Data:   Vitals:   10/14/24 0613 10/14/24 0630 10/14/24 0800 10/14/24 0907  BP:  (!) 125/96 (!) 125/98 108/82  Pulse:  71 (!) 130 (!) 120  Resp:  17 12   Temp: 98.3 F (36.8 C)  97.9  F (36.6 C)   TempSrc: Oral  Oral   SpO2:  97% 93%   Weight:      Height:        Intake/Output Summary (Last 24 hours) at 10/14/2024 0935 Last data filed at 10/14/2024 0808 Gross per 24 hour  Intake 122.13 ml  Output 4000 ml  Net -3877.87 ml   Filed Weights   10/13/24 2033  Weight: 77.8 kg   Body mass index is 30.38 kg/m.   Physical Exam: General: Well developed, well nourished, in no acute distress. Head: Normocephalic, atraumatic, sclera non-icteric, no xanthomas, nares without discharge.  Neck: Negative for carotid bruits. JVD not elevated. Lungs: Mildly diminished breath sounds along the left base. Heart: Tachycardic and irregularly irregular with S1 S2. No murmurs, rubs, or gallops appreciated. Abdomen: Soft, non-tender, non-distended with normoactive bowel sounds. No hepatomegaly. No rebound/guarding. No obvious abdominal masses. Msk:  Strength and tone appear normal for age. Extremities: No clubbing or cyanosis. No edema. Distal pedal pulses are 2+ and equal bilaterally. Neuro: Alert. No facial asymmetry. No focal deficit. Moves all extremities spontaneously. Psych:  Responds to questions with a normal affect.   EKG:  The EKG was personally reviewed and demonstrates: A-fib with RVR, 144 bpm, nonspecific  ST-T changes Telemetry:  Telemetry was personally reviewed and demonstrates: A-fib with ventricular rates in the 1-teens to 140s bpm  Weights: Filed Weights   10/13/24 2033  Weight: 77.8 kg    Relevant CV Studies:  2D echo 10/05/2023: 1. Left ventricular ejection fraction, by estimation, is 50 to 55%. The  left ventricle has low normal function. Left ventricular endocardial  border not optimally defined to evaluate regional wall motion. There is  mild left ventricular hypertrophy. Left  ventricular diastolic parameters are indeterminate.   2. Right ventricular systolic function is normal. The right ventricular  size is normal. There is normal pulmonary artery  systolic pressure.   3. Left atrial size was severely dilated.   4. The mitral valve is normal in structure. Mild mitral valve  regurgitation. No evidence of mitral stenosis.   5. Tricuspid valve regurgitation is mild to moderate.   6. 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 Aortic valve regurgitation is not  visualized. Aortic valve sclerosis/calcification is present, without any  evidence of aortic stenosis.   7. The inferior vena cava is normal in size with greater than 50%  respiratory variability, suggesting right atrial pressure of 3 mmHg.    Laboratory Data:  Chemistry Recent Labs  Lab 10/13/24 2252 10/14/24 0410  NA 143 144  K 3.5 3.6  CL 110 106  CO2 23 24  GLUCOSE 99 91  BUN 19 16  CREATININE 0.69 0.66  CALCIUM  9.3 10.0  GFRNONAA >60 >60  ANIONGAP 10 14    Recent Labs  Lab 10/13/24 2252  PROT 6.1*  ALBUMIN 3.9  AST 37  ALT 35  ALKPHOS 80  BILITOT 1.0   Hematology Recent Labs  Lab 10/13/24 2043 10/14/24 0410  WBC 6.3 6.6  RBC 4.03 4.25  HGB 13.2 14.0  HCT 39.6 41.8  MCV 98.3 98.4  MCH 32.8 32.9  MCHC 33.3 33.5  RDW 14.4 14.0  PLT 139* 138*   Cardiac EnzymesNo results for input(s): TROPONINI in the last 168 hours. No results for input(s): TROPIPOC in the last 168 hours.  BNP Recent Labs  Lab 10/13/24 2252  PROBNP 4,836.0*    DDimer No results for input(s): DDIMER in the last 168 hours.  Radiology/Studies:  DG Chest Portable 1 View Result Date: 10/13/2024 IMPRESSION: 1. Cardiomegaly with vascular congestion and interstitial prominence, likely early interstitial edema. 2. Suspected small bilateral effusions. Electronically signed by: Franky Crease MD 10/13/2024 09:27 PM EST RP Workstation: [id number]    Assessment and Plan:   1. A-fib with RVR:  - Initially diagnosed postoperatively following ORIF of left hip in 09/2023 - Found to be in A-fib with RVR upon arrival to [facility] ED on 10/13/2024, of unknown chronicity - Possibly in the setting of  abnormal thyroid  function with suppressed TSH on replacement therapy - Continue diltiazem  drip for now  - For added ventricular rate control, transition Toprol -XL to Lopressor  12.5 mg every 6 hours - CHA2DS2-VASc at least 5 (CHF, HTN, age x 2, sex category)  - PTA apixaban  for now, though may need to transition to heparin  drip pending troponin trend and echo findings  - Unclear at this time if she has been adherent to apixaban , will need to review with family when they arrive - If she has difficult to control ventricular rates, would require TEE guided DCCV prior to discharge  2. Elevated high-sensitivity troponin:  - Denies chest pain - Mildly elevated in the ER at 21, not trended, will repeat  - If there is  dynamic elevation in troponin patient will need to be transitioned off PTA apixaban  and placed on heparin  drip  - Obtain echo - With underlying dementia, do not anticipate invasive ischemic testing at this time  3. Acute CHF:  - Vigorous urine output noted in the ER of 3.9 L  - IV Lasix  40 mg twice daily for now - Echo pending  - Escalate GDMT as able/indicated  - Daily standing scale weights with strict I's and O's   4. Abnormal thyroid  function:  - TSH suppressed since 03/2024, likely contributing to recurrent Afib  - Has been maintained on levothyroxine  112 mcg daily recommend reduction at the discretion of internal medicine   5. Bicuspid aortic valve:  - Echo in 09/2023 with aortic valve sclerosis without evidence of stenosis  - Echo pending   6. HTN:  - Blood pressure much improved - Pharmacotherapy as outlined above   7. HLD:  - LDL 86 in 03/2024  - PTA atorvastatin  40 mg   8.  Dementia: - No family present at bedside - Management per internal medicine      For questions or updates, please contact [facility] Please consult [email or web address] for contact info under Cardiology/STEMI.   Signed, Bernardino Bring, PA-C [facility] Pager: [phone] 10/14/2024, 9:35 AM       [1]  Allergies Allergen Reactions   Codeine Nausea Only    Tolerates oxycodone    Morphine  Nausea And Vomiting   Ace Inhibitors Cough   Adhesive [Tape] Other (See Comments) and Rash    whelps   "

## 2024-10-15 ENCOUNTER — Inpatient Hospital Stay: Admit: 2024-10-15 | Discharge: 2024-10-15 | Disposition: A | Attending: Internal Medicine | Admitting: Internal Medicine

## 2024-10-15 DIAGNOSIS — I5031 Acute diastolic (congestive) heart failure: Secondary | ICD-10-CM | POA: Diagnosis not present

## 2024-10-15 DIAGNOSIS — I10 Essential (primary) hypertension: Secondary | ICD-10-CM

## 2024-10-15 DIAGNOSIS — I4891 Unspecified atrial fibrillation: Secondary | ICD-10-CM

## 2024-10-15 LAB — BASIC METABOLIC PANEL WITH GFR
Anion gap: 10 (ref 5–15)
BUN: 10 mg/dL (ref 8–23)
CO2: 31 mmol/L (ref 22–32)
Calcium: 9.9 mg/dL (ref 8.9–10.3)
Chloride: 100 mmol/L (ref 98–111)
Creatinine, Ser: 0.71 mg/dL (ref 0.44–1.00)
GFR, Estimated: >60 mL/min
Glucose, Bld: 82 mg/dL (ref 70–99)
Potassium: 3.2 mmol/L — ABNORMAL LOW (ref 3.5–5.1)
Sodium: 141 mmol/L (ref 135–145)

## 2024-10-15 LAB — MAGNESIUM: Magnesium: 2.1 mg/dL (ref 1.7–2.4)

## 2024-10-15 MED ORDER — QUETIAPINE FUMARATE 25 MG PO TABS
25.0000 mg | ORAL_TABLET | Freq: Every day | ORAL | Status: DC
  Administered 2024-10-16 – 2024-10-23 (×8): 25 mg via ORAL
  Filled 2024-10-15 (×9): qty 1

## 2024-10-15 MED ORDER — POTASSIUM CHLORIDE CRYS ER 20 MEQ PO TBCR
40.0000 meq | EXTENDED_RELEASE_TABLET | ORAL | Status: AC
  Administered 2024-10-15 (×2): 40 meq via ORAL
  Filled 2024-10-15 (×2): qty 2

## 2024-10-15 MED ORDER — DILTIAZEM HCL 30 MG PO TABS: 90.0000 mg | ORAL_TABLET | Freq: Three times a day (TID) | ORAL | Status: DC

## 2024-10-15 MED ORDER — HALOPERIDOL 2 MG PO TABS
2.0000 mg | ORAL_TABLET | Freq: Four times a day (QID) | ORAL | Status: DC | PRN
  Administered 2024-10-21: 2 mg via ORAL
  Filled 2024-10-15 (×3): qty 1

## 2024-10-15 MED ORDER — FUROSEMIDE 40 MG PO TABS
80.0000 mg | ORAL_TABLET | Freq: Two times a day (BID) | ORAL | Status: DC
  Administered 2024-10-15 – 2024-10-18 (×7): 80 mg via ORAL
  Filled 2024-10-15 (×7): qty 2

## 2024-10-15 MED ORDER — DILTIAZEM HCL-DEXTROSE 125-5 MG/125ML-% IV SOLN (PREMIX)
5.0000 mg/h | INTRAVENOUS | Status: DC
  Administered 2024-10-15 – 2024-10-16 (×2): 5 mg/h via INTRAVENOUS
  Administered 2024-10-17: 10 mg/h via INTRAVENOUS
  Filled 2024-10-15 (×3): qty 125

## 2024-10-15 MED ORDER — HALOPERIDOL LACTATE 5 MG/ML IJ SOLN
2.0000 mg | Freq: Four times a day (QID) | INTRAMUSCULAR | Status: DC | PRN
  Administered 2024-10-15 – 2024-10-22 (×2): 2 mg via INTRAMUSCULAR
  Filled 2024-10-15 (×2): qty 1

## 2024-10-15 MED ORDER — DILTIAZEM HCL 30 MG PO TABS
30.0000 mg | ORAL_TABLET | Freq: Three times a day (TID) | ORAL | Status: DC
  Administered 2024-10-15: 30 mg via ORAL
  Filled 2024-10-15: qty 1

## 2024-10-15 NOTE — Plan of Care (Signed)
  Problem: Clinical Measurements: Goal: Respiratory complications will improve Outcome: Progressing   Problem: Clinical Measurements: Goal: Cardiovascular complication will be avoided Outcome: Progressing   Problem: Pain Managment: Goal: General experience of comfort will improve and/or be controlled Outcome: Progressing   Problem: Safety: Goal: Ability to remain free from injury will improve Outcome: Progressing

## 2024-10-15 NOTE — Progress Notes (Signed)
 Echocardiogram 2D Echocardiogram has been performed.  Latiana Tomei N Michelene Keniston,RDCS 10/15/2024, 12:03 PM

## 2024-10-15 NOTE — Progress Notes (Signed)
 "  Progress Note  Patient Name: [name] Date of Encounter: 10/15/2024  Primary Cardiologist: Perla  Subjective   Husband at bedside. Patient denies chest pain or dyspnea. State she feels better today than yesterday.  Documented UOP 1.4 L for the past 24 hours, net - 5.4 L for the admission. Renal function stable.   Inpatient Medications    Scheduled Meds:  apixaban   5 mg Oral BID   atorvastatin   40 mg Oral Daily   citalopram   20 mg Oral Daily   donepezil   5 mg Oral QHS   furosemide   40 mg Intravenous Q12H   levothyroxine   112 mcg Oral Q0600   loratadine   10 mg Oral Daily   melatonin  10 mg Oral QHS   metoprolol  tartrate  25 mg Oral Q6H   multivitamin with minerals  1 tablet Oral Daily   potassium chloride   40 mEq Oral Q2H   Continuous Infusions:  diltiazem  (CARDIZEM ) infusion 5 mg/hr (10/15/24 0514)   PRN Meds: acetaminophen  **OR** acetaminophen , magnesium  hydroxide, ondansetron  **OR** ondansetron  (ZOFRAN ) IV, traZODone    Vital Signs    Vitals:   10/15/24 0220 10/15/24 0500 10/15/24 0625 10/15/24 0852  BP: 122/88  127/78 100/85  Pulse:   (!) 124 (!) 58  Resp:   18 20  Temp:   98.6 F (37 C) 98.5 F (36.9 C)  TempSrc:      SpO2:   92% 96%  Weight:  72.9 kg    Height:        Intake/Output Summary (Last 24 hours) at 10/15/2024 1023 Last data filed at 10/15/2024 0700 Gross per 24 hour  Intake 469.69 ml  Output 2000 ml  Net -1530.31 ml   Filed Weights   10/13/24 2033 10/15/24 0500  Weight: 77.8 kg 72.9 kg    Telemetry    Afib with ventricular rates in the 90s to low 100s with brief paroxysms into the 130s bpm - Personally Reviewed  ECG    No new tracings - Personally Reviewed  Physical Exam   GEN: No acute distress.   Neck: No JVD. Cardiac: IRIR, no murmurs, rubs, or gallops.  Respiratory: Mildly diminished along the left base.  GI: Soft, nontender, non-distended.   MS: No edema; No deformity. Neuro:  Alert; Nonfocal.  Psych: Normal  affect.  Labs    Chemistry Recent Labs  Lab 10/13/24 2252 10/14/24 0410 10/15/24 0439  NA 143 144 141  K 3.5 3.6 3.2*  CL 110 106 100  CO2 23 24 31   GLUCOSE 99 91 82  BUN 19 16 10   CREATININE 0.69 0.66 0.71  CALCIUM  9.3 10.0 9.9  PROT 6.1*  --   --   ALBUMIN 3.9  --   --   AST 37  --   --   ALT 35  --   --   ALKPHOS 80  --   --   BILITOT 1.0  --   --   GFRNONAA >60 >60 >60  ANIONGAP 10 14 10      Hematology Recent Labs  Lab 10/13/24 2043 10/14/24 0410  WBC 6.3 6.6  RBC 4.03 4.25  HGB 13.2 14.0  HCT 39.6 41.8  MCV 98.3 98.4  MCH 32.8 32.9  MCHC 33.3 33.5  RDW 14.4 14.0  PLT 139* 138*    Cardiac EnzymesNo results for input(s): TROPONINI in the last 168 hours. No results for input(s): TROPIPOC in the last 168 hours.   BNP Recent Labs  Lab 10/13/24 2252  PROBNP 4,836.0*     DDimer No results for input(s): DDIMER in the last 168 hours.   Radiology    DG Chest Portable 1 View Result Date: 10/13/2024 IMPRESSION: 1. Cardiomegaly with vascular congestion and interstitial prominence, likely early interstitial edema. 2. Suspected small bilateral effusions. Electronically signed by: Franky Crease MD 10/13/2024 09:27 PM EST RP Workstation: [id number]    Cardiac Studies   2D echo 10/05/2023: 1. Left ventricular ejection fraction, by estimation, is 50 to 55%. The  left ventricle has low normal function. Left ventricular endocardial  border not optimally defined to evaluate regional wall motion. There is  mild left ventricular hypertrophy. Left  ventricular diastolic parameters are indeterminate.   2. Right ventricular systolic function is normal. The right ventricular  size is normal. There is normal pulmonary artery systolic pressure.   3. Left atrial size was severely dilated.   4. The mitral valve is normal in structure. Mild mitral valve  regurgitation. No evidence of mitral stenosis.   5. Tricuspid valve regurgitation is mild to moderate.   6. The  aortic valve is bicuspid. Aortic valve regurgitation is not  visualized. Aortic valve sclerosis/calcification is present, without any  evidence of aortic stenosis.   7. The inferior vena cava is normal in size with greater than 50%  respiratory variability, suggesting right atrial pressure of 3 mmHg.  __________  2D echo pending  Patient Profile     84 y.o. female with history of postoperative A-fib following left hip surgery, bicuspid aortic valve, HTN, HLD, hypothyroidism, arthritis, dementia, anxiety, and OSA not on CPAP  who is being seen today for the evaluation of recurrent Afib and volume overload at the request of Dr. Lawence.   Assessment & Plan    1. A-fib with RVR:  - Initially diagnosed postoperatively following ORIF of left hip in 09/2023 - Found to be in A-fib with RVR upon arrival to [facility] ED on 10/13/2024, of unknown chronicity - Possibly in the setting of abnormal thyroid  function with suppressed TSH on replacement therapy - Transition from diltiazem  drip to short-acting diltiazem  30 mg every 8 hours with continuation of Lopressor  12.5 mg every 6 hours - Consolidate pharmacotherapy as able - CHA2DS2-VASc at least 5 (CHF, HTN, age x 2, sex category)  - PTA apixaban  for now, though may need to transition to heparin  drip pending echo findings  - Unclear at this time if she has been adherent to apixaban , will need to review with family when they arrive - If she has difficult to control ventricular rates, would require TEE guided DCCV prior to discharge  2. Elevated high-sensitivity troponin:  - Denies chest pain - Mildly elevated  - If there is dynamic elevation in troponin patient will need to be transitioned off PTA apixaban  and placed on heparin  drip  - Obtain echo - With underlying dementia, do not anticipate invasive ischemic testing at this time  3. Acute CHF:  - Volume status improving, net - 5.4 L - IV Lasix  40 mg twice daily for now - Echo pending  - Escalate  GDMT as able/indicated  - Daily standing scale weights with strict I's and O's   4. Abnormal thyroid  function:  - TSH suppressed since 03/2024, likely contributing to recurrent Afib  - Has been maintained on levothyroxine  112 mcg daily recommend reduction at the discretion of internal medicine   5. Bicuspid aortic valve:  - Echo in 09/2023 with aortic valve sclerosis without evidence of stenosis  - Echo pending  6. HTN:  - Blood pressure much improved - Pharmacotherapy as outlined above   7. HLD:  - LDL 86 in 03/2024  - PTA atorvastatin  40 mg    8.  Dementia: - No family present at bedside - Management per internal medicine  9. Hypokalemia: - Replete to goal 4.0       For questions or updates, please contact [facility] Please consult [email or web address] for contact info under Cardiology/STEMI.    Signed, Bernardino Bring, PA-C [facility] Pager: [phone] 10/15/2024, 10:23 AM  "

## 2024-10-15 NOTE — Progress Notes (Signed)
 Patient given PRN haldol  due to continued agitation and screaming out Robert help. Attempted to redirect with no success. After a few minutes pt was able to feed herself after tray setup and mittens were removed at this time to facilitate eating.

## 2024-10-15 NOTE — Progress Notes (Signed)
" °  Progress Note   Patient: [name] FMW:[id number] DOB: 26-May-1941 DOA: 10/13/2024     2 DOS: the patient was seen and examined on 10/15/2024   Brief hospital course: RONITA HARGREAVES is a 84 y.o. Caucasian female with medical history significant for essential hypertension, dyslipidemia, hypothyroidism, osteoarthritis and anxiety, who presented to the ER with acute onset of worsening dyspnea with associated chest pain felt this palpitations over the last couple of days  Upon arriving to hospital, she was found to have atrial fibrillation with RVR at 133, chest x-ray showed cardiomegaly with vascular congestion, proBNP 4836. Patient was placed on beta-blocker, diltiazem  drip, and furosemide .  Cardiology consult obtained.   Principal Problem:   Atrial fibrillation with RVR (HCC) Active Problems:   Acute heart failure (HCC)   Hypertensive urgency   Hypothyroidism   Dyslipidemia   Anxiety and depression   Essential hypertension   Assessment and Plan: *Chronic atrial fibrillation with RVR (HCC) Patient still has significant tachycardia, followed by cardiology, continue beta-blocker and diltiazem  infusion.  Continue Eliquis . Heart rate seem to be improving, she did receive 1 dose of digoxin  yesterday.   Acute on chronic diastolic CHF (congestive heart failure) (HCC) Elevated troponin secondary to congestive heart failure. Hypertension urgency. - This likely diastolic as her last 2D echo on 10/05/2023 revealed an EF of 50 to 55% with indeterminate left ventricular diastolic parameter.  The left atrium was severely dilated and she had mild to moderate tricuspid regurgitation and mild mitral regurgitation with bicuspid aortic valve. Condition seem to be improving, continue Lasix , follow-up on renal function and electrolytes.  Hypokalemia. Secondary to Lasix , potassium repleted.   Mild thrombocytopenia. Continue to follow.   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 Diet and exercise.    Hypothyroidism - We will continue Synthroid  and check TSH level.   Essential hypertension - Will continue Toprol -XL.   Anxiety and depression - Will continue Celexa  and trazodone .   Dyslipidemia - Will continue statin therapy.        Subjective:  Patient feels much better today, no short of breath.  Physical Exam: Vitals:   10/15/24 0220 10/15/24 0500 10/15/24 0625 10/15/24 0852  BP: 122/88  127/78 100/85  Pulse:   (!) 124 (!) 58  Resp:   18 20  Temp:   98.6 F (37 C) 98.5 F (36.9 C)  TempSrc:      SpO2:   92% 96%  Weight:  72.9 kg    Height:       General exam: Appears calm and comfortable  Respiratory system: Clear to auscultation. Respiratory effort normal. Cardiovascular system: Irregular irregular and tachycardic. No JVD, murmurs, rubs, gallops or clicks. No pedal edema. Gastrointestinal system: Abdomen is nondistended, soft and nontender. No organomegaly or masses felt. Normal bowel sounds heard. Central nervous system: Alert and oriented x2. No focal neurological deficits. Extremities: Symmetric 5 x 5 power. Skin: No rashes, lesions or ulcers Psychiatry: Judgement and insight appear normal. Mood & affect appropriate.    Data Reviewed:  Lab results reviewed.  Family Communication: Husband updated at the bedside  Disposition: Status is: Inpatient Remains inpatient appropriate because: Severity of disease, IV treatment     Time spent: 35 minutes  Author: Murvin Mana, MD 10/15/2024 11:20 AM  For on call review [email or web address].    "

## 2024-10-15 NOTE — Progress Notes (Addendum)
 Pt with increasing agitation and confusion. Pt asking for her husband, does not remember he visited earlier today. Pt screaming for help and stating she needs a new shirt. Patient provided with clean gowns and bed linens. Pt has removed x2 IV's, denies removing them and states she doesn't remember. Pt pulling at lines, clothing and tele monitoring. Attempted to reorient patient multiple times with no result. Delirium precautions observed today, blinds open, lights on. Patient placed in mittens. IV team consult placed again. Patient noted to have HR elevated in 130's. MD notified via epic chat of above. See new orders. Bed locked and low, alarm on, call bell in reach, Door open for safety.

## 2024-10-15 NOTE — Progress Notes (Signed)
 Patient trying to get out of bed, pointing at the  mirror stating I want to go to the bedroom. Attempted to reorient the patient and provided education that the patient is in the hospital bed currently. Pt continues to state she is not and that she want to go in the other room. Repositioned patient in bed, placed mittens on patient d/t her continued pulling on lines, tubes and medical equipment. Pt's HR continues to be elevated to 130's and patient replaced on IV diltiazem .

## 2024-10-16 DIAGNOSIS — I10 Essential (primary) hypertension: Secondary | ICD-10-CM | POA: Diagnosis not present

## 2024-10-16 DIAGNOSIS — I5031 Acute diastolic (congestive) heart failure: Secondary | ICD-10-CM | POA: Diagnosis not present

## 2024-10-16 DIAGNOSIS — I5021 Acute systolic (congestive) heart failure: Secondary | ICD-10-CM

## 2024-10-16 DIAGNOSIS — I4891 Unspecified atrial fibrillation: Secondary | ICD-10-CM | POA: Diagnosis not present

## 2024-10-16 LAB — BASIC METABOLIC PANEL WITH GFR
Anion gap: 11 (ref 5–15)
BUN: 17 mg/dL (ref 8–23)
CO2: 29 mmol/L (ref 22–32)
Calcium: 9.5 mg/dL (ref 8.9–10.3)
Chloride: 101 mmol/L (ref 98–111)
Creatinine, Ser: 0.9 mg/dL (ref 0.44–1.00)
GFR, Estimated: >60 mL/min
Glucose, Bld: 98 mg/dL (ref 70–99)
Potassium: 3.5 mmol/L (ref 3.5–5.1)
Sodium: 141 mmol/L (ref 135–145)

## 2024-10-16 LAB — CBC
HCT: 44.4 % (ref 36.0–46.0)
Hemoglobin: 14.8 g/dL (ref 12.0–15.0)
MCH: 32.7 pg (ref 26.0–34.0)
MCHC: 33.3 g/dL (ref 30.0–36.0)
MCV: 98.2 fL (ref 80.0–100.0)
Platelets: 133 10*3/uL — ABNORMAL LOW (ref 150–400)
RBC: 4.52 MIL/uL (ref 3.87–5.11)
RDW: 13.5 % (ref 11.5–15.5)
WBC: 8.2 10*3/uL (ref 4.0–10.5)
nRBC: 0 % (ref 0.0–0.2)

## 2024-10-16 LAB — MAGNESIUM: Magnesium: 2.1 mg/dL (ref 1.7–2.4)

## 2024-10-16 LAB — ECHOCARDIOGRAM COMPLETE
AR max vel: 2.16 cm2
AV Mean grad: 2.3 mmHg
AV Peak grad: 4.6 mmHg
Ao pk vel: 1.08 m/s
Area-P 1/2: 5.62 cm2
Height: 63 in
S' Lateral: 4.2 cm
Weight: 2571.45 [oz_av]

## 2024-10-16 MED ORDER — DIGOXIN 0.25 MG/ML IJ SOLN
0.1250 mg | Freq: Four times a day (QID) | INTRAMUSCULAR | Status: AC
  Administered 2024-10-16 – 2024-10-17 (×3): 0.125 mg via INTRAVENOUS
  Filled 2024-10-16 (×3): qty 2

## 2024-10-16 MED ORDER — POTASSIUM CHLORIDE CRYS ER 20 MEQ PO TBCR
40.0000 meq | EXTENDED_RELEASE_TABLET | Freq: Once | ORAL | Status: AC
  Administered 2024-10-16: 40 meq via ORAL
  Filled 2024-10-16: qty 2

## 2024-10-16 NOTE — NC FL2 (Signed)
 " [place]  MEDICAID FL2 LEVEL OF CARE FORM     IDENTIFICATION  Patient Name: [name] Birthdate: 02-Mar-1941 Sex: female Admission Date (Current Location): 10/13/2024  [facility] and [place] Number:  Chiropodist and Address:         Provider Number: [id number]  Attending Physician Name and Address:  Laurita Pillion, MD  Relative Name and Phone Number:       Current Level of Care: Hospital Recommended Level of Care: Skilled Nursing Facility Prior Approval Number:    Date Approved/Denied:   PASRR Number: [id number]  Discharge Plan: SNF    Current Diagnoses: Patient Active Problem List   Diagnosis Date Noted   Acute heart failure (HCC) 10/14/2024   Dyslipidemia 10/14/2024   Anxiety and depression 10/14/2024   Essential hypertension 10/14/2024   Hypothyroidism 10/14/2024   Hypertensive urgency 10/14/2024   Atrial fibrillation with RVR (HCC) 10/13/2024   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   Atrial fibrillation with rapid ventricular response (HCC) 10/06/2023   Thrombocytopenia 06/10/2021   Aortic atherosclerosis 05/08/2021   Overweight with body mass index (BMI) of 28 to 28.9 in adult 03/18/2021   RLS (restless legs syndrome) 01/29/2021   Obstructive chronic bronchitis without exacerbation (HCC) 01/29/2021   Hyperlipidemia 07/09/2020   Hyperparathyroidism 04/15/2019   Acquired hypothyroidism 08/05/2018   Anxiety 08/04/2018   Psychophysiological insomnia 08/04/2018   Osteoarthritis 04/14/2018   Hypertension 11/23/2017    Orientation RESPIRATION BLADDER Height & Weight     Self  Normal Incontinent Weight: 70.7 kg Height:  5' 3 (160 cm)  BEHAVIORAL SYMPTOMS/MOOD NEUROLOGICAL BOWEL NUTRITION STATUS      Incontinent Diet  AMBULATORY STATUS COMMUNICATION OF NEEDS Skin   Extensive Assist Verbally Normal                       Personal Care Assistance Level of Assistance  Dressing     Dressing Assistance: Maximum  assistance     Functional Limitations Info             SPECIAL CARE FACTORS FREQUENCY  PT (By licensed PT), OT (By licensed OT)     PT Frequency: 3X/Week OT Frequency: 3X/Week            Contractures Contractures Info: Not present    Additional Factors Info  Code Status Code Status Info: Full             Current Medications (10/16/2024):  This is the current hospital active medication list Current Facility-Administered Medications  Medication Dose Route Frequency Provider Last Rate Last Admin   acetaminophen  (TYLENOL ) tablet 650 mg  650 mg Oral Q6H PRN Mansy, Jan A, MD   650 mg at 10/15/24 1635   Or   acetaminophen  (TYLENOL ) suppository 650 mg  650 mg Rectal Q6H PRN Mansy, Jan A, MD       apixaban  (ELIQUIS ) tablet 5 mg  5 mg Oral BID Mansy, Jan A, MD   5 mg at 10/16/24 9170   atorvastatin  (LIPITOR) tablet 40 mg  40 mg Oral Daily Mansy, Jan A, MD   40 mg at 10/16/24 9170   citalopram  (CELEXA ) tablet 20 mg  20 mg Oral Daily Mansy, Jan A, MD   20 mg at 10/16/24 9170   digoxin  (LANOXIN ) 0.25 MG/ML injection 0.125 mg  0.125 mg Intravenous Q6H Raford Riggs, MD   0.125 mg at 10/16/24 1508   diltiazem  (CARDIZEM ) 125 mg in dextrose  5% 125 mL (1 mg/mL) infusion  5-15 mg/hr Intravenous Titrated Laurita Pillion, MD 5 mL/hr at 10/16/24 1207 5 mg/hr at 10/16/24 1207   donepezil  (ARICEPT ) tablet 5 mg  5 mg Oral QHS Mansy, Jan A, MD   5 mg at 10/15/24 2226   furosemide  (LASIX ) tablet 80 mg  80 mg Oral BID Zhang, Dekui, MD   80 mg at 10/16/24 9170   haloperidol  (HALDOL ) tablet 2 mg  2 mg Oral Q6H PRN Laurita Pillion, MD       Or   haloperidol  lactate (HALDOL ) injection 2 mg  2 mg Intramuscular Q6H PRN Zhang, Dekui, MD   2 mg at 10/15/24 1739   levothyroxine  (SYNTHROID ) tablet 112 mcg  112 mcg Oral Q0600 Mansy, Jan A, MD   112 mcg at 10/16/24 0636   loratadine  (CLARITIN ) tablet 10 mg  10 mg Oral Daily Mansy, Jan A, MD   10 mg at 10/16/24 9170   magnesium  hydroxide (MILK OF MAGNESIA)  suspension 30 mL  30 mL Oral Daily PRN Mansy, Jan A, MD       melatonin tablet 10 mg  10 mg Oral QHS Belue, Nathan S, RPH   10 mg at 10/15/24 2226   metoprolol  tartrate (LOPRESSOR ) tablet 25 mg  25 mg Oral Q6H End, Christopher, MD   25 mg at 10/16/24 9162   multivitamin with minerals tablet 1 tablet  1 tablet Oral Daily Mansy, Jan A, MD   1 tablet at 10/16/24 9170   ondansetron  (ZOFRAN ) tablet 4 mg  4 mg Oral Q6H PRN Mansy, Jan A, MD   4 mg at 10/15/24 1635   Or   ondansetron  (ZOFRAN ) injection 4 mg  4 mg Intravenous Q6H PRN Mansy, Jan A, MD       QUEtiapine  (SEROQUEL ) tablet 25 mg  25 mg Oral QHS Zhang, Dekui, MD         Discharge Medications: Please see discharge summary for a list of discharge medications.  Relevant Imaging Results:  Relevant Lab Results:   Additional Information: SS: [id number]    Victory Jackquline RAMAN, RN     "

## 2024-10-16 NOTE — Plan of Care (Signed)
  Problem: Nutrition: Goal: Adequate nutrition will be maintained Outcome: Progressing   Problem: Coping: Goal: Level of anxiety will decrease Outcome: Progressing   Problem: Elimination: Goal: Will not experience complications related to urinary retention Outcome: Progressing   Problem: Safety: Goal: Ability to remain free from injury will improve Outcome: Progressing   

## 2024-10-16 NOTE — Progress Notes (Signed)
 "  Progress Note  Patient Name: [name] Date of Encounter: 10/16/2024  Primary Cardiologist: Gollan  Subjective   Intermittent confusion and agitation overnight leading increased ventricular rates with Afib with RVR into the 130s bpm and restarting of diltiazem  gtt. Documented UOP net - 5.2 L for the admission. Renal function normal, though BUN/SCr starting to increase.   Inpatient Medications    Scheduled Meds:  apixaban   5 mg Oral BID   atorvastatin   40 mg Oral Daily   citalopram   20 mg Oral Daily   donepezil   5 mg Oral QHS   furosemide   80 mg Oral BID   levothyroxine   112 mcg Oral Q0600   loratadine   10 mg Oral Daily   melatonin  10 mg Oral QHS   metoprolol  tartrate  25 mg Oral Q6H   multivitamin with minerals  1 tablet Oral Daily   QUEtiapine   25 mg Oral QHS   Continuous Infusions:  diltiazem  (CARDIZEM ) infusion 5 mg/hr (10/15/24 1840)   PRN Meds: acetaminophen  **OR** acetaminophen , haloperidol  **OR** haloperidol  lactate, magnesium  hydroxide, ondansetron  **OR** ondansetron  (ZOFRAN ) IV, traZODone    Vital Signs    Vitals:   10/16/24 0000 10/16/24 0312 10/16/24 0439 10/16/24 0600  BP: 102/62 (!) 92/59  106/63  Pulse: (!) 116 (!) 118    Resp:  18    Temp: 98.9 F (37.2 C) 98.8 F (37.1 C)    TempSrc: Oral     SpO2: 94% 95%    Weight:   70.7 kg   Height:        Intake/Output Summary (Last 24 hours) at 10/16/2024 0719 Last data filed at 10/15/2024 1417 Gross per 24 hour  Intake 180 ml  Output --  Net 180 ml   Filed Weights   10/13/24 2033 10/15/24 0500 10/16/24 0439  Weight: 77.8 kg 72.9 kg 70.7 kg    Telemetry    See MD attestation - Personally Reviewed  ECG    No new tracings - Personally Reviewed  Physical Exam   See MD attestation   Labs    Chemistry Recent Labs  Lab 10/13/24 2252 10/14/24 0410 10/15/24 0439 10/16/24 0443  NA 143 144 141 141  K 3.5 3.6 3.2* 3.5  CL 110 106 100 101  CO2 23 24 31 29   GLUCOSE 99 91 82 98   BUN 19 16 10 17   CREATININE 0.69 0.66 0.71 0.90  CALCIUM  9.3 10.0 9.9 9.5  PROT 6.1*  --   --   --   ALBUMIN 3.9  --   --   --   AST 37  --   --   --   ALT 35  --   --   --   ALKPHOS 80  --   --   --   BILITOT 1.0  --   --   --   GFRNONAA >60 >60 >60 >60  ANIONGAP 10 14 10 11      Hematology Recent Labs  Lab 10/13/24 2043 10/14/24 0410 10/16/24 0443  WBC 6.3 6.6 8.2  RBC 4.03 4.25 4.52  HGB 13.2 14.0 14.8  HCT 39.6 41.8 44.4  MCV 98.3 98.4 98.2  MCH 32.8 32.9 32.7  MCHC 33.3 33.5 33.3  RDW 14.4 14.0 13.5  PLT 139* 138* 133*    Cardiac EnzymesNo results for input(s): TROPONINI in the last 168 hours. No results for input(s): TROPIPOC in the last 168 hours.   BNP Recent Labs  Lab 10/13/24 2252  PROBNP 4,836.0*  DDimer No results for input(s): DDIMER in the last 168 hours.   Radiology    DG Chest Portable 1 View Result Date: 10/13/2024 IMPRESSION: 1. Cardiomegaly with vascular congestion and interstitial prominence, likely early interstitial edema. 2. Suspected small bilateral effusions. Electronically signed by: Franky Crease MD 10/13/2024 09:27 PM EST RP Workstation: [id number]    Cardiac Studies   2D echo 10/05/2023: 1. Left ventricular ejection fraction, by estimation, is 50 to 55%. The  left ventricle has low normal function. Left ventricular endocardial  border not optimally defined to evaluate regional wall motion. There is  mild left ventricular hypertrophy. Left  ventricular diastolic parameters are indeterminate.   2. Right ventricular systolic function is normal. The right ventricular  size is normal. There is normal pulmonary artery systolic pressure.   3. Left atrial size was severely dilated.   4. The mitral valve is normal in structure. Mild mitral valve  regurgitation. No evidence of mitral stenosis.   5. Tricuspid valve regurgitation is mild to moderate.   6. 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 Aortic valve regurgitation is not  visualized.  Aortic valve sclerosis/calcification is present, without any  evidence of aortic stenosis.   7. The inferior vena cava is normal in size with greater than 50%  respiratory variability, suggesting right atrial pressure of 3 mmHg.  __________  2D echo 10/15/2024: 1. Global hypokinesis worse in the basal to mid anteroseptal and  inferoseptal myocardium. Left ventricular ejection fraction, by  estimation, is 40 to 45%. Left ventricular ejection fraction by 3D volume  is 42 %. The left ventricle has mildly decreased  function. The left ventricle demonstrates global hypokinesis. Left  ventricular diastolic function could not be evaluated. Elevated left  ventricular end-diastolic pressure. The average left ventricular global  longitudinal strain is -13.0 %. The global  longitudinal strain is abnormal.   2. Right ventricular systolic function is normal. The right ventricular  size is normal. There is normal pulmonary artery systolic pressure.   3. Left atrial size was severely dilated.   4. Right atrial size was moderately dilated.   5. Moderate pleural effusion in the left lateral region.   6. The mitral valve is normal in structure. Mild mitral valve  regurgitation. No evidence of mitral stenosis.   7. The aortic valve is tricuspid. Aortic valve regurgitation is not  visualized. No aortic stenosis is present.   8. The inferior vena cava is normal in size with <50% respiratory  variability, suggesting right atrial pressure of 8 mmHg.   Patient Profile     84 y.o. female with history of postoperative A-fib following left hip surgery, bicuspid aortic valve, HTN, HLD, hypothyroidism, arthritis, dementia, anxiety, and OSA not on CPAP  who is being seen today for the evaluation of recurrent Afib and volume overload at the request of Dr. Lawence.   Assessment & Plan    1. A-fib with RVR:  - Initially diagnosed postoperatively following ORIF of left hip in 09/2023 - Found to be in A-fib with RVR  upon arrival to [facility] ED on 10/13/2024, of unknown chronicity - Possibly in the setting of abnormal thyroid  function with suppressed TSH on replacement therapy - Due to recurrent RVR in the setting of confusion and agitation with underlying dementia, she has been placed back on diltiazem  gtt, which will be continued for now, transition from diltiazem  drip to short-acting diltiazem  when able - Continue Lopressor  12.5 mg every 6 hours - Consolidate pharmacotherapy as able - CHA2DS2-VASc at least 5 (  CHF, HTN, age x 2, sex category)  - PTA apixaban  5 mg bid (does not meet reduced dosing criteria) - Patient's husband on 1/24 reported he believed the patient had missed a couple doses of Eliquis  over the preceding 4 weeks - If she has difficult to control ventricular rates, would require TEE guided DCCV prior to discharge, would like to avoid this if possible though given underlying dementia   2. Elevated high-sensitivity troponin:  - Has denied chest pain throughout admission  - Mildly elevated  - Echo this admission with mildly reduced LV systolic function with an EF of 40-45% with global hypokinesis, previously 50-55% - With underlying dementia, do not anticipate invasive ischemic testing at this time - Has been maintain on OAC in lieu of ASA  3. Acute HFmrEF/pleural effusion:  - Volume status improved, net - 5.2 L - Renal function beginning to up-trend - Echo this admission with mildly reduced LV systolic function with an EF of 40-45% with global hypokinesis, previously 50-55% - Etiology of cardiomyopathy is unclear at this time, cannot exclude tachy-mediated vs ischemic  - Given underlying dementia with agitation, do not anticipate invasive ischemic testing at this time, will need to discuss with family when present  - Left pleural effusion persists on echo, unclear if this is large enough for possible thoracentesis  - Escalate GDMT as able/indicated  - Daily standing scale weights with strict  I's and O's   4. Abnormal thyroid  function:  - TSH suppressed since 03/2024, likely contributing to recurrent Afib  - Has been maintained on levothyroxine  112 mcg daily recommend reduction at the discretion of internal medicine   5. Bicuspid aortic valve:  - Echo in 09/2023 with aortic valve sclerosis without evidence of stenosis  - Echo this admission documented tricuspid aortic valve without evidence of stenosis or insufficiency   6. HTN:  - Blood pressure much improved - Pharmacotherapy as outlined above   7. HLD:  - LDL 86 in 03/2024  - PTA atorvastatin  40 mg    8.  Dementia: - Management per internal medicine  9. Hypokalemia: - Improving        For questions or updates, please contact [facility] Please consult [email or web address] for contact info under Cardiology/STEMI.    Signed, Bernardino Bring, PA-C [facility] Pager: [phone] 10/16/2024, 7:19 AM  "

## 2024-10-16 NOTE — TOC Initial Note (Signed)
 Transition of Care [facility]) - Initial/Assessment Note    Patient Details  Name: [name] MRN: [id number] Date of Birth: [birth year]  [facility]) CM/SW Contact:    Victory Jackquline RAMAN, RN Phone Number: 10/16/2024, 4:46 PM  Clinical Narrative:                 RNCM spoke to the patient's husband @ [phone], introduced myself and my role and explained that discharge planning would be discussed. PT is recommending STR. Husband is in agreement with STR and would like for us  to submit referral's for him and review the bed offers with him once they come in. Referral's sent for bed Offer, FL2 completed PASRR# [id number]. RNCM will continue to follow for discharge planning needs.           Patient Goals and CMS Choice            Expected Discharge Plan and Services                                              Prior Living Arrangements/Services                       Activities of Daily Living   ADL Screening (condition at time of admission) Independently performs ADLs?: No Does the patient have a NEW difficulty with bathing/dressing/toileting/self-feeding that is expected to last >3 days?: No Does the patient have a NEW difficulty with getting in/out of bed, walking, or climbing stairs that is expected to last >3 days?: No Does the patient have a NEW difficulty with communication that is expected to last >3 days?: No Is the patient deaf or have difficulty hearing?: No Does the patient have difficulty seeing, even when wearing glasses/contacts?: No Does the patient have difficulty concentrating, remembering, or making decisions?: Yes  Permission Sought/Granted                  Emotional Assessment              Admission diagnosis:  Anxiety [F41.9] Acute pulmonary edema (HCC) [J81.0] Sore throat [J02.9] 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] Atrial fibrillation with rapid ventricular response (HCC) [I48.91] Atrial fibrillation  with RVR (HCC) [I48.91] Patient Active Problem List   Diagnosis Date Noted   Acute heart failure (HCC) 10/14/2024   Dyslipidemia 10/14/2024   Anxiety and depression 10/14/2024   Essential hypertension 10/14/2024   Hypothyroidism 10/14/2024   Hypertensive urgency 10/14/2024   Atrial fibrillation with RVR (HCC) 10/13/2024   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   Atrial fibrillation with rapid ventricular response (HCC) 10/06/2023   Thrombocytopenia 06/10/2021   Aortic atherosclerosis 05/08/2021   Overweight with body mass index (BMI) of 28 to 28.9 in adult 03/18/2021   RLS (restless legs syndrome) 01/29/2021   Obstructive chronic bronchitis without exacerbation (HCC) 01/29/2021   Hyperlipidemia 07/09/2020   Hyperparathyroidism 04/15/2019   Acquired hypothyroidism 08/05/2018   Anxiety 08/04/2018   Psychophysiological insomnia 08/04/2018   Osteoarthritis 04/14/2018   Hypertension 11/23/2017   PCP:  Antonette Angeline ORN, NP Pharmacy:   [facility] [phone] - [place], [place] - [address] AT [facility] OF [address] [place] [address] [place] [address] Phone: [phone] Fax: [phone]     Social Drivers of Health (SDOH) Social History: SDOH Screenings   Food  Insecurity: Food Insecurity Present (10/14/2024)  Housing: Low Risk (10/14/2024)  Transportation Needs: No Transportation Needs (10/14/2024)  Utilities: Not At Risk (10/14/2024)  Alcohol  Screen: Low Risk (01/01/2024)  Depression (PHQ2-9): Low Risk (01/01/2024)  Financial Resource Strain: Low Risk (04/12/2024)  Physical Activity: Inactive (01/01/2024)  Social Connections: Moderately Isolated (10/14/2024)  Stress: No Stress Concern Present (04/12/2024)  Tobacco Use: High Risk (04/15/2024)  Health Literacy: Adequate Health Literacy (05/26/2023)   SDOH Interventions:     Readmission Risk Interventions     No data to display

## 2024-10-16 NOTE — Progress Notes (Signed)
 " Progress Note   Patient: [name] FMW:[id number] DOB: Sep 04, 1941 DOA: 10/13/2024     3 DOS: the patient was seen and examined on 10/16/2024   Brief hospital course: [name] is a 84 y.o. Caucasian female with medical history significant for essential hypertension, dyslipidemia, hypothyroidism, osteoarthritis and anxiety, who presented to the ER with acute onset of worsening dyspnea with associated chest pain felt this palpitations over the last couple of days  Upon arriving to hospital, she was found to have atrial fibrillation with RVR at 133, chest x-ray showed cardiomegaly with vascular congestion, proBNP 4836. Patient was placed on beta-blocker, diltiazem  drip, and furosemide .  Cardiology consult obtained.   Principal Problem:   Atrial fibrillation with RVR (HCC) Active Problems:   Acute heart failure (HCC)   Hypertensive urgency   Hypothyroidism   Dyslipidemia   Anxiety and depression   Essential hypertension   Assessment and Plan: *Chronic atrial fibrillation with RVR (HCC) Patient still has significant tachycardia, followed by cardiology, treated with beta-blocker and diltiazem  infusion.  Continued Eliquis .  Diltiazem  infusion was taking off yesterday, but patient had worsening tachycardia, restarted.  Will continue to follow, heart rate appears to be slowing down today.  Appreciate cardiology consult.  Acute on chronic diastolic CHF (congestive heart failure) (HCC) Elevated troponin secondary to congestive heart failure. Hypertension urgency. - This likely diastolic as her last 2D echo on 10/05/2023 revealed an EF of 50 to 55% with indeterminate left ventricular diastolic parameter.  The left atrium was severely dilated and she had mild to moderate tricuspid regurgitation and mild mitral regurgitation with bicuspid aortic valve. Patient was treated with IV Lasix , changed to oral Lasix  80 mg twice a day yesterday evening after she pulled out her IV.   Continue.  Hospital delirium. Patient was very agitated yesterday, received Haldol  as needed.  Also given Seroquel  last night, she slept better last night.  She is no longer agitated this morning.   Hypokalemia. Secondary to Lasix , potassium is 3.5 today, will give additional dose of oral KCl.   Mild thrombocytopenia. Still stable.   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 Diet and exercise.   Hypothyroidism - We will continue Synthroid  and check TSH level.   Essential hypertension - Will continue Toprol -XL.   Anxiety and depression - Will continue Celexa  and trazodone .   Dyslipidemia - Will continue statin therapy.      Subjective:  Patient doing well this morning, she slept last night.  Short of breath seem to be improving  Physical Exam: Vitals:   10/16/24 0439 10/16/24 0600 10/16/24 0800 10/16/24 0826  BP:  106/63  110/85  Pulse:    (!) 110  Resp:   16 (!) 21  Temp:    98.7 F (37.1 C)  TempSrc:    Oral  SpO2:    96%  Weight: 70.7 kg     Height:       General exam: Appears calm and comfortable  Respiratory system: Clear to auscultation. Respiratory effort normal. Cardiovascular system: Irregular irregular and tachycardic. No JVD, murmurs, rubs, gallops or clicks. No pedal edema. Gastrointestinal system: Abdomen is nondistended, soft and nontender. No organomegaly or masses felt. Normal bowel sounds heard. Central nervous system: Alert and oriented. No focal neurological deficits. Extremities: Symmetric 5 x 5 power. Skin: No rashes, lesions or ulcers Psychiatry: Judgement and insight appear normal. Mood & affect appropriate.    Data Reviewed:  Lab results reviewed.  Family Communication: None  Disposition: Status is: Inpatient  Remains inpatient appropriate because: Severity of disease, IV treatment.     Time spent: 35 minutes  Author: Murvin Mana, MD 10/16/2024 9:46 AM  For on call review [email or web address].    "

## 2024-10-16 NOTE — Care Management Important Message (Signed)
 Important Message  Patient Details  Name: [name] MRN: [id number] Date of Birth: August 22, 1941   Important Message Given:  Yes - Medicare IM     Sherhonda Gaspar W, CMA 10/16/2024, 12:23 PM

## 2024-10-17 ENCOUNTER — Ambulatory Visit: Admitting: Internal Medicine

## 2024-10-17 ENCOUNTER — Telehealth: Payer: Self-pay | Admitting: Pharmacist

## 2024-10-17 DIAGNOSIS — I10 Essential (primary) hypertension: Secondary | ICD-10-CM | POA: Diagnosis not present

## 2024-10-17 DIAGNOSIS — I5031 Acute diastolic (congestive) heart failure: Secondary | ICD-10-CM | POA: Diagnosis not present

## 2024-10-17 DIAGNOSIS — I4891 Unspecified atrial fibrillation: Secondary | ICD-10-CM | POA: Diagnosis not present

## 2024-10-17 MED ORDER — DILTIAZEM HCL 30 MG PO TABS
60.0000 mg | ORAL_TABLET | Freq: Three times a day (TID) | ORAL | Status: DC
  Administered 2024-10-17 – 2024-10-18 (×3): 60 mg via ORAL
  Filled 2024-10-17 (×3): qty 2

## 2024-10-17 MED ORDER — POTASSIUM CHLORIDE CRYS ER 20 MEQ PO TBCR
40.0000 meq | EXTENDED_RELEASE_TABLET | Freq: Every day | ORAL | Status: DC
  Administered 2024-10-17: 40 meq via ORAL
  Filled 2024-10-17 (×2): qty 2

## 2024-10-17 NOTE — Progress Notes (Signed)
 Occupational Therapy Treatment Patient Details Name: [name] MRN: [id number] DOB: Aug 28, 1941 Today's Date: 10/17/2024   History of present illness Pt is an 84 year old female admitted with Afib with RVR, Acute on chronic diastolic CHF (congestive heart failure) (HCC)  Elevated troponin secondary to congestive heart failure, Hypertensive urgency.      PMH significant for essential hypertension, dyslipidemia, hypothyroidism, osteoarthritis and anxiety, dementia   OT comments  Upon entering the room, pt supine in bed with husband initially present in room. Pt needing mod A of 2 for bed mobility. Pt sits on EOB with min guard for balance. She stands with min A of 2 and use of RW for several side steps to get closer to [facility]. Pt reports feeling dizzy with standing with vitals checked and pt not being positive for orthostatic hypotension. Pt needing +2 assistance to return to supine and was very fatigued at this point. All needs within reach and husband enters back into room to visit with pt.        If plan is discharge home, recommend the following:  A little help with walking and/or transfers;A little help with bathing/dressing/bathroom;Supervision due to cognitive status   Equipment Recommendations  Other (comment) (defer)       Precautions / Restrictions Precautions Precautions: Fall Recall of Precautions/Restrictions: Impaired       Mobility Bed Mobility Overal bed mobility: Needs Assistance Bed Mobility: Supine to Sit, Sit to Supine     Supine to sit: Mod assist, HOB elevated, +2 for safety/equipment Sit to supine: Mod assist, +2 for safety/equipment        Transfers Overall transfer level: Needs assistance Equipment used: 2 person hand held assist, Rolling walker (2 wheels) Transfers: Sit to/from Stand Sit to Stand: Min assist, +2 physical assistance                 Balance Overall balance assessment: Needs assistance Sitting-balance support: Feet  supported Sitting balance-Leahy Scale: Fair     Standing balance support: Bilateral upper extremity supported, During functional activity, Reliant on assistive device for balance Standing balance-Leahy Scale: Poor                             ADL either performed or assessed with clinical judgement   ADL Overall ADL's : Needs assistance/impaired     Grooming: Sitting;Set up;Supervision/safety;Wash/dry face                                      Extremity/Trunk Assessment Upper Extremity Assessment Upper Extremity Assessment: Generalized weakness   Lower Extremity Assessment Lower Extremity Assessment: Generalized weakness        Vision Patient Visual Report: No change from baseline           Communication Communication Communication: Impaired Factors Affecting Communication: Difficulty expressing self;Reduced clarity of speech   Cognition Arousal: Alert Behavior During Therapy: WFL for tasks assessed/performed Cognition: Cognition impaired, No family/caregiver present to determine baseline, History of cognitive impairments   Orientation impairments: Place, Time, Situation     Attention impairment (select first level of impairment): Sustained attention                     Following commands: Impaired Following commands impaired: Follows one step commands with increased time      Cueing   Cueing Techniques: Verbal cues, Tactile  cues, Visual cues             Pertinent Vitals/ Pain       Pain Assessment Pain Assessment: No/denies pain         Frequency  Min 2X/week        Progress Toward Goals  OT Goals(current goals can now be found in the care plan section)  Progress towards OT goals: Progressing toward goals         Co-evaluation    PT/OT/SLP Co-Evaluation/Treatment: Yes Reason for Co-Treatment: For patient/therapist safety;Necessary to address cognition/behavior during functional activity PT goals  addressed during session: Mobility/safety with mobility;Balance OT goals addressed during session: ADL's and self-care      AM-PAC OT 6 Clicks Daily Activity     Outcome Measure   Help from another person eating meals?: A Little Help from another person taking care of personal grooming?: A Little Help from another person toileting, which includes using toliet, bedpan, or urinal?: A Lot Help from another person bathing (including washing, rinsing, drying)?: A Lot Help from another person to put on and taking off regular upper body clothing?: A Lot Help from another person to put on and taking off regular lower body clothing?: A Lot 6 Click Score: 14    End of Session Equipment Utilized During Treatment: Rolling walker (2 wheels)  OT Visit Diagnosis: Other abnormalities of gait and mobility (R26.89);Muscle weakness (generalized) (M62.81);Other symptoms and signs involving cognitive function   Activity Tolerance Patient tolerated treatment well   Patient Left in bed;with call bell/phone within reach;with bed alarm set;with family/visitor present   Nurse Communication Mobility status        Time: 1040-1058 OT Time Calculation (min): 18 min  Charges: OT General Charges $OT Visit: 1 Visit  Jocelyn Claude, MS, [name] ascom [phone]  10/17/24, 12:42 PM

## 2024-10-17 NOTE — Plan of Care (Signed)
   Problem: Education: Goal: Knowledge of General Education information will improve Description Including pain rating scale, medication(s)/side effects and non-pharmacologic comfort measures Outcome: Progressing

## 2024-10-17 NOTE — Progress Notes (Signed)
 " Progress Note   Patient: [name] FMW:[id number] DOB: 03-09-1941 DOA: 10/13/2024     4 DOS: the patient was seen and examined on 10/17/2024   Brief hospital course: [name] is a 84 y.o. Caucasian female with medical history significant for essential hypertension, dyslipidemia, hypothyroidism, osteoarthritis and anxiety, who presented to the ER with acute onset of worsening dyspnea with associated chest pain felt this palpitations over the last couple of days  Upon arriving to hospital, she was found to have atrial fibrillation with RVR at 133, chest x-ray showed cardiomegaly with vascular congestion, proBNP 4836. Patient was placed on beta-blocker, diltiazem  drip, and furosemide .  Cardiology consult obtained.   Principal Problem:   Atrial fibrillation with RVR (HCC) Active Problems:   Acute heart failure (HCC)   Hypertensive urgency   Hypothyroidism   Dyslipidemia   Anxiety and depression   Essential hypertension   Assessment and Plan: *Chronic atrial fibrillation with RVR (HCC) Patient still has significant tachycardia, followed by cardiology, treated with beta-blocker and diltiazem  infusion.  Continued Eliquis .  Diltiazem  infusion was taking off yesterday, but patient had worsening tachycardia, restarted 1/25.  He was also given digoxin  by cardiology yesterday.  Heart rates much better today, continue beta-blocker, change diltiazem  to oral.   Acute on chronic diastolic CHF (congestive heart failure) (HCC) Elevated troponin secondary to congestive heart failure. Hypertension urgency. - This likely diastolic as her last 2D echo on 10/05/2023 revealed an EF of 50 to 55% with indeterminate left ventricular diastolic parameter.  The left atrium was severely dilated and she had mild to moderate tricuspid regurgitation and mild mitral regurgitation with bicuspid aortic valve. Patient was treated with IV Lasix , changed to oral Lasix  80 mg twice a day 1/24 after she pulled out  her IV.  Volume status much better. Pressure cardiology consulted.   Hospital delirium. Patient was very agitated yesterday, received Haldol  as needed.  Also given Seroquel  at night, she slept better.  She is no longer agitated.   Hypokalemia. Scheduled KCl 40 mEq daily while on high-dose oral Lasix .   Mild thrombocytopenia. Still stable.   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 Diet and exercise.   Hypothyroidism - We will continue Synthroid  and check TSH level.   Essential hypertension - Will continue Toprol -XL.   Anxiety and depression - Will continue Celexa  and trazodone .   Dyslipidemia - Will continue statin therapy.          Subjective:  Patient doing better today.  Slept well last night, no confusion today.  Physical Exam: Vitals:   10/17/24 0800 10/17/24 0900 10/17/24 0928 10/17/24 0943  BP: 129/76 (!) 90/47 (!) 89/71 102/63  Pulse: (!) 128 89    Resp: 14 17  17   Temp: 98.4 F (36.9 C)     TempSrc: Oral     SpO2: 95% 94%    Weight:      Height:       General exam: Appears calm and comfortable  Respiratory system: A few crackles in left base, respiratory effort normal. Cardiovascular system: Irregularly irregular. No JVD, murmurs, rubs, gallops or clicks. No pedal edema. Gastrointestinal system: Abdomen is nondistended, soft and nontender. No organomegaly or masses felt. Normal bowel sounds heard. Central nervous system: Alert and oriented. No focal neurological deficits. Extremities: Symmetric 5 x 5 power. Skin: No rashes, lesions or ulcers Psychiatry: Judgement and insight appear normal. Mood & affect appropriate.    Data Reviewed:  Lab results reviewed.  Family Communication: husband updated  at bedside. Disposition: Status is: Inpatient Remains inpatient appropriate because: Severity of disease, unsafe discharge, also pending nursing placement.     Time spent: 35 minutes  Author: Murvin Mana, MD 10/17/2024 11:11 AM  For on call review  [email or web address].    "

## 2024-10-17 NOTE — Plan of Care (Signed)

## 2024-10-17 NOTE — Progress Notes (Signed)
 "  Cardiology Progress Note   Patient Name: [name] Date of Encounter: 10/17/2024  Primary Cardiologist: Timothy Gollan, MD  Subjective   No complaints this morning.  Husband at bedside.  Denies chest pain, palpitations, or dyspnea.  Became more tachycardic overnight and was placed back on IV diltiazem  which has since been transition to p.o. Objective   Inpatient Medications    Scheduled Meds:  apixaban   5 mg Oral BID   atorvastatin   40 mg Oral Daily   citalopram   20 mg Oral Daily   diltiazem   60 mg Oral Q8H   donepezil   5 mg Oral QHS   furosemide   80 mg Oral BID   levothyroxine   112 mcg Oral Q0600   loratadine   10 mg Oral Daily   melatonin  10 mg Oral QHS   metoprolol  tartrate  25 mg Oral Q6H   multivitamin with minerals  1 tablet Oral Daily   potassium chloride   40 mEq Oral Daily   QUEtiapine   25 mg Oral QHS   Continuous Infusions:  PRN Meds: acetaminophen  **OR** acetaminophen , haloperidol  **OR** haloperidol  lactate, magnesium  hydroxide, ondansetron  **OR** ondansetron  (ZOFRAN ) IV   Vital Signs    Vitals:   10/17/24 0943 10/17/24 1000 10/17/24 1100 10/17/24 1227  BP: 102/63 (!) 97/58 111/67 103/75  Pulse:    95  Resp: 17 20 18    Temp:    98.7 F (37.1 C)  TempSrc:    Oral  SpO2:    95%  Weight:      Height:        Intake/Output Summary (Last 24 hours) at 10/17/2024 1241 Last data filed at 10/17/2024 1228 Gross per 24 hour  Intake --  Output 550 ml  Net -550 ml   Filed Weights   10/15/24 0500 10/16/24 0439 10/17/24 0500  Weight: 72.9 kg 70.7 kg 77 kg    Physical Exam   GEN: Well nourished, well developed, in no acute distress.  HEENT: Grossly normal.  Neck: Supple, no JVD, carotid bruits, or masses. Cardiac: Irregularly irregular, distant, no murmurs, rubs, or gallops. No clubbing, cyanosis, edema.  Radials 2+, DP/PT 2+ and equal bilaterally.  Respiratory:  Respirations regular and unlabored, clear to auscultation bilaterally. GI: Soft,  nontender, nondistended, BS + x 4. MS: no deformity or atrophy. Skin: warm and dry, no rash. Neuro:  Strength and sensation are intact. Psych: Normal affect.  Labs    Chemistry Recent Labs  Lab 10/13/24 2252 10/14/24 0410 10/15/24 0439 10/16/24 0443  NA 143 144 141 141  K 3.5 3.6 3.2* 3.5  CL 110 106 100 101  CO2 23 24 31 29   GLUCOSE 99 91 82 98  BUN 19 16 10 17   CREATININE 0.69 0.66 0.71 0.90  CALCIUM  9.3 10.0 9.9 9.5  PROT 6.1*  --   --   --   ALBUMIN 3.9  --   --   --   AST 37  --   --   --   ALT 35  --   --   --   ALKPHOS 80  --   --   --   BILITOT 1.0  --   --   --   GFRNONAA >60 >60 >60 >60  ANIONGAP 10 14 10 11      Hematology Recent Labs  Lab 10/13/24 2043 10/14/24 0410 10/16/24 0443  WBC 6.3 6.6 8.2  RBC 4.03 4.25 4.52  HGB 13.2 14.0 14.8  HCT 39.6 41.8 44.4  MCV 98.3 98.4  98.2  MCH 32.8 32.9 32.7  MCHC 33.3 33.5 33.3  RDW 14.4 14.0 13.5  PLT 139* 138* 133*    Cardiac Enzymes  Recent Labs  Lab 10/13/24 2252  TRNPT 21*    BNP    Component Value Date/Time   BNP 62 03/19/2021 0838    ProBNP    Component Value Date/Time   PROBNP 4,836.0 (H) 10/13/2024 2252     Lipids  Lab Results  Component Value Date   CHOL 167 04/13/2024   HDL 54 04/13/2024   LDLCALC 86 04/13/2024   TRIG 168 (H) 04/13/2024   CHOLHDL 3.1 04/13/2024    HbA1c  Lab Results  Component Value Date   HGBA1C 5.4 04/13/2024   Lab Results  Component Value Date   TSH 0.28 (L) 06/14/2024     Radiology    DG Chest Portable 1 View Result Date: 10/13/2024 EXAM: 1 VIEW(S) XRAY OF THE CHEST 10/13/2024 09:24:00 PM COMPARISON: 10/03/2023 CLINICAL HISTORY: sob Shortness of breath. FINDINGS: LUNGS AND PLEURA: Interstitial prominence, likely early interstitial edema. Suspect small bilateral effusions. No focal pulmonary opacity. No pneumothorax. HEART AND MEDIASTINUM: Cardiomegaly with vascular congestion. Aortic atherosclerosis. BONES AND SOFT TISSUES: No acute osseous  abnormality. IMPRESSION: 1. Cardiomegaly with vascular congestion and interstitial prominence, likely early interstitial edema. 2. Suspected small bilateral effusions. Electronically signed by: Franky Crease MD 10/13/2024 09:27 PM EST RP Workstation: [id number]     Telemetry    Atrial fibrillation with rates up to the 140s overnight.  Currently 1 teens.- Personally Reviewed  Cardiac Studies   2D echo 10/05/2023: 1. Left ventricular ejection fraction, by estimation, is 50 to 55%. The  left ventricle has low normal function. Left ventricular endocardial  border not optimally defined to evaluate regional wall motion. There is  mild left ventricular hypertrophy. Left  ventricular diastolic parameters are indeterminate.   2. Right ventricular systolic function is normal. The right ventricular  size is normal. There is normal pulmonary artery systolic pressure.   3. Left atrial size was severely dilated.   4. The mitral valve is normal in structure. Mild mitral valve  regurgitation. No evidence of mitral stenosis.   5. Tricuspid valve regurgitation is mild to moderate.   6. 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 Aortic valve regurgitation is not  visualized. Aortic valve sclerosis/calcification is present, without any  evidence of aortic stenosis.   7. The inferior vena cava is normal in size with greater than 50%  respiratory variability, suggesting right atrial pressure of 3 mmHg.  __________   2D echo 10/15/2024: 1. Global hypokinesis worse in the basal to mid anteroseptal and  inferoseptal myocardium. Left ventricular ejection fraction, by  estimation, is 40 to 45%. Left ventricular ejection fraction by 3D volume  is 42 %. The left ventricle has mildly decreased  function. The left ventricle demonstrates global hypokinesis. Left  ventricular diastolic function could not be evaluated. Elevated left  ventricular end-diastolic pressure. The average left ventricular global  longitudinal strain is  -13.0 %. The global  longitudinal strain is abnormal.   2. Right ventricular systolic function is normal. The right ventricular  size is normal. There is normal pulmonary artery systolic pressure.   3. Left atrial size was severely dilated.   4. Right atrial size was moderately dilated.   5. Moderate pleural effusion in the left lateral region.   6. The mitral valve is normal in structure. Mild mitral valve  regurgitation. No evidence of mitral stenosis.   7. The aortic valve  is tricuspid. Aortic valve regurgitation is not  visualized. No aortic stenosis is present.   8. The inferior vena cava is normal in size with <50% respiratory  variability, suggesting right atrial pressure of 8 mmHg.   Patient Profile     84 y.o. female with history of postoperative A-fib following left hip surgery, bicuspid aortic valve, HTN, HLD, hypothyroidism, arthritis, dementia, anxiety, and OSA not on CPAP who was admitted 10/13/2024 w/ afib and CHF.  EF now 40-45%.  Assessment & Plan    1.  PAF:  H/o post-op Afib following ORIF in 09/2023.  Admitted 1/22 w/ rapid Afib of unclear duration w/ new finding of LV dysfxn on echo (EF 40-45%, sev dil LA).  IV dilt was d/c'd 1/25 due to LV dysfxn but resumed overnight due to rates rising into the 140's.  Remains in afib in 90's to 1 teens this AM.  IV dilt transitioned to PO.  Also on metoprolol  25 q6h and digoxin  0.125 daily.  TFTs pending this AM w/ suppressed TSH in 05/2024 in the setting of levothyroxine  therapy.  Husband notes that she skipped her Eliquis  dose 1 day in the past week and suspects that she may miss a dose every so often.  Discussed role of TEE and cardioversion pending thyroid  function tests.  Provided these are normal, would consider adding amiodarone  prior to cardioversion as maintaining sinus rhythm will likely prove to be difficult in the setting of severe left atrial dilation.   Informed Consent   Shared Decision Making/Informed Consent   The risks  [stroke, cardiac arrhythmias rarely resulting in the need for a temporary or permanent pacemaker, skin irritation or burns, esophageal damage, perforation (1:10,000 risk), bleeding, pharyngeal hematoma as well as other potential complications associated with conscious sedation including aspiration, arrhythmia, respiratory failure and death], benefits (treatment guidance, restoration of normal sinus rhythm, diagnostic support) and alternatives of a transesophageal echocardiogram guided cardioversion were discussed in detail with [name] and she is willing to proceed.      2.  Acute HFrEF: In setting of above, EF 40-45% w/ glob HK by echo this admission.  Transitioned to oral lasix  1/25.  Minus 700 ml yesterday (no intake recorded).  Wt inaccurate - listed as up 7 kg this AM.  Euvolemic on exam.  Able to lie flat.  Renal fxn stable.  Cont current dose of PO lasix .  Cont ? blocker w/ plan to consolidate for Toprol  XL prior to d/c.  GDMT otherwise limited by soft BP, therefor, not on acei/arb/arni/mra.  Consider SGLT2i.  3.  Bicuspid AoV:  Partial fusion of L and non-coronary cusps.  No AS on echo this admission.  4.  HTN:  BPs soft on oral ? blocker and dilt.  Will look to move away from dilt as HRs allow.  5.  HL:  Cont statin rx.  Signed, Lonni Meager, NP  10/17/2024, 12:41 PM    For questions or updates, please contact   Please consult [email or web address] for contact info under Cardiology/STEMI.  "

## 2024-10-17 NOTE — Telephone Encounter (Signed)
 Heart Failure Stewardship Pharmacy Note  PCP: Antonette Angeline ORN, NP PCP-Cardiologist: Evalene Lunger, MD  [name] is a 84 y.o. female with hypertension, dyslipidemia, hypothyroidism, atrial fibrillation, osteoarthritis and anxiety who presented with dyspnea, chest pain, and palpitations. On admission, BNP was 4836, HS-troponin was 21, and LFTs were WNL. Chest x-ray noted vascular congestion and suspected small bilateral effusions.    Pertinent cardiac history: AF initially diagnosed in 09/2023. TTE 09/2023 noted LVEF of 50-55% with mild LVH, mild MR, mild to moderate TR. Parents this admission in AF with RVR, rate of 144 bpm on EKG. TTE this admission showed a reduction in LVEF to 40-45% with mild MR.  Pertinent Lab Values: Creat  Date Value Ref Range Status  04/13/2024 0.83 0.60 - 0.95 mg/dL Final   Creatinine, Ser  Date Value Ref Range Status  10/16/2024 0.90 0.44 - 1.00 mg/dL Final   BUN  Date Value Ref Range Status  10/16/2024 17 8 - 23 mg/dL Final  97/92/7974 13 8 - 27 mg/dL Final  87/94/7986 13 7 - 18 mg/dL Final   Potassium  Date Value Ref Range Status  10/16/2024 3.5 3.5 - 5.1 mmol/L Final  08/26/2012 3.3 (L) 3.5 - 5.1 mmol/L Final   Sodium  Date Value Ref Range Status  10/16/2024 141 135 - 145 mmol/L Final  10/30/2023 141 134 - 144 mmol/L Final  08/26/2012 139 136 - 145 mmol/L Final   Brain Natriuretic Peptide  Date Value Ref Range Status  03/19/2021 62 <100 pg/mL Final    Comment:    . BNP levels increase with age in the general population with the highest values seen in individuals greater than 67 years of age. Reference: J. Am. Penne. Cardiol. 2002; 59:023-017. .    Magnesium   Date Value Ref Range Status  10/16/2024 2.1 1.7 - 2.4 mg/dL Final    Comment:    Performed at [facility], [address] [place], [place] [address]   Hgb A1c MFr Bld  Date Value Ref Range Status  04/13/2024 5.4 <5.7 % Final    Comment:    For the purpose  of screening for the presence of diabetes: . <5.7%       Consistent with the absence of diabetes 5.7-6.4%    Consistent with increased risk for diabetes             (prediabetes) > or =6.5%  Consistent with diabetes . This assay result is consistent with a decreased risk of diabetes. . Currently, no consensus exists regarding use of hemoglobin A1c for diagnosis of diabetes in children. . According to American Diabetes Association (ADA) guidelines, hemoglobin A1c <7.0% represents optimal control in non-pregnant diabetic patients. Different metrics may apply to specific patient populations.  Standards of Medical Care in Diabetes(ADA). .    TSH  Date Value Ref Range Status  06/14/2024 0.28 (L) 0.40 - 4.50 mIU/L Final   LDH  Date Value Ref Range Status  02/11/2022 135 98 - 192 U/L Final    Comment:    Performed at [facility], [address] [place], [place] [address]   Current Heart Failure Medications:  Loop diuretic: furosemide  80 mg PO BID Beta-Blocker: metoprolol  tartrate 25 mg PO q6h ACEI/ARB/ARNI: none MRA: none SGLT2i: none Other: diltiazem  10 mg/hr   Prior to admission Heart Failure Medications:  Loop diuretic: none Beta-Blocker: metoprolol  succinate 25 mg daily ACEI/ARB/ARNI: none MRA: none SGLT2i: none Other: none  Assessment: 1. Acute systolic heart failure (LVEF 40-45%) ,  due to presumed NICM. NYHA class symptoms unable to be assessed.  -Patient evaluated via chart review and did not answer telephone call for symptom assessment and education. Will attempt again when able to assess in person and after hospital delirium has improved. -Given LVEF is mildly reduced, would recommend transitioning of diltiazem  in favor of alternative rate control (ie amiodarone ).  Plan: 1) Medication changes recommended at this time: -Consider transitioning off diltiazem  if able  2) Patient assistance: -Pending  3) Education: -To be completed prior to  discharge.  Please do not hesitate to reach out with questions or concerns,  Jaun Bash, PharmD, CPP, BCPS, [facility] Heart Failure Pharmacist  Phone - [phone] 10/17/2024 8:43 AM

## 2024-10-17 NOTE — TOC Progression Note (Addendum)
 Transition of Care [facility]) - Progression Note    Patient Details  Name: [name] MRN: [id number] Date of Birth: [birth year]  [facility] CM/SW Contact  Lauraine JAYSON Carpen, LCSW Phone Number: 10/17/2024, 9:43 AM  Clinical Narrative:  [facility] has offered a bed. Several facilities have not responded yet. CSW asked [employer], [employer] [facility], [facility], [facility], [facility], and [facility] to review referral.   12:29 pm: This CSW is working remote today. Tried calling husband to provide SNF bed offers. No answer and unable to leave a voicemail. Will try again later.  3:11 pm: Tried calling husband again. No answer.  Expected Discharge Plan and Services                                               Social Drivers of Health (SDOH) Interventions SDOH Screenings   Food Insecurity: Food Insecurity Present (10/14/2024)  Housing: Low Risk (10/14/2024)  Transportation Needs: No Transportation Needs (10/14/2024)  Utilities: Not At Risk (10/14/2024)  Alcohol  Screen: Low Risk (01/01/2024)  Depression (PHQ2-9): Low Risk (01/01/2024)  Financial Resource Strain: Low Risk (04/12/2024)  Physical Activity: Inactive (01/01/2024)  Social Connections: Moderately Isolated (10/14/2024)  Stress: No Stress Concern Present (04/12/2024)  Tobacco Use: High Risk (04/15/2024)  Health Literacy: Adequate Health Literacy (05/26/2023)    Readmission Risk Interventions     No data to display

## 2024-10-17 NOTE — Progress Notes (Signed)
 Physical Therapy Treatment Patient Details Name: [name] MRN: [id number] DOB: 02/08/1941 Today's Date: 10/17/2024   History of Present Illness Pt is an 84 year old female admitted with Afib with RVR, Acute on chronic diastolic CHF (congestive heart failure) (HCC)  Elevated troponin secondary to congestive heart failure, Hypertensive urgency.      PMH significant for essential hypertension, dyslipidemia, hypothyroidism, osteoarthritis and anxiety, dementia    PT Comments  Patient with some confusion but agreeable to PT session. Spouse present at start and at end of session. The patient requires physical assistance with mobility. Activity tolerance limited by fatigue and generalized weakness. She was able to stand x 1 bout with assistance but unable to progress walking. Recommend to continue PT to maximize independence and facilitate return to prior level of function. Consider rehabilitation < 3 hours/day after this  hospital stay.    If plan is discharge home, recommend the following: A lot of help with walking and/or transfers;A lot of help with bathing/dressing/bathroom;Assistance with cooking/housework;Direct supervision/assist for medications management;Direct supervision/assist for financial management;Assist for transportation;Help with stairs or ramp for entrance;Supervision due to cognitive status   Can travel by private vehicle        Equipment Recommendations   (TBD at next level of care)    Recommendations for Other Services       Precautions / Restrictions Precautions Precautions: Fall Recall of Precautions/Restrictions: Impaired Restrictions Weight Bearing Restrictions Per Provider Order: No     Mobility  Bed Mobility Overal bed mobility: Needs Assistance Bed Mobility: Supine to Sit, Sit to Supine     Supine to sit: Mod assist, HOB elevated, +2 for safety/equipment Sit to supine: Mod assist, +2 for safety/equipment   General bed mobility comments: cues for  initiation and sequencing. increased time and effort required    Transfers Overall transfer level: Needs assistance Equipment used: 2 person hand held assist, Rolling walker (2 wheels) Transfers: Sit to/from Stand Sit to Stand: Min assist, +2 physical assistance           General transfer comment: cues for anterior weight shifting and hand placement. increased time required to complete tasks    Ambulation/Gait               General Gait Details: unable to due to fatigue with minimal activity, limited standing tolerance, reported dizziness with activity   Stairs             Wheelchair Mobility     Tilt Bed    Modified Rankin (Stroke Patients Only)       Balance Overall balance assessment: Needs assistance Sitting-balance support: Feet supported Sitting balance-Leahy Scale: Fair     Standing balance support: Bilateral upper extremity supported, During functional activity, Reliant on assistive device for balance Standing balance-Leahy Scale: Poor Standing balance comment: external support required to maintain standing balance                            Communication Communication Communication: Impaired Factors Affecting Communication: Difficulty expressing self;Reduced clarity of speech  Cognition Arousal: Alert Behavior During Therapy: WFL for tasks assessed/performed   PT - Cognitive impairments: History of cognitive impairments, Initiation, Memory                         Following commands: Impaired Following commands impaired: Follows one step commands with increased time    Cueing Cueing Techniques: Verbal cues, Tactile cues, Visual  cues  Exercises      General Comments General comments (skin integrity, edema, etc.): vitals monitored throughout session without significant change with activity. MAP in the 70's. supportive spouse at bedside. encouraged lights on/shades up during the day to promote sleep/wake cycle and  help with confusion.      Pertinent Vitals/Pain Pain Assessment Pain Assessment: No/denies pain    Home Living                          Prior Function            PT Goals (current goals can now be found in the care plan section) Acute Rehab PT Goals Patient Stated Goal: did not state PT Goal Formulation: With patient Time For Goal Achievement: 10/28/24 Potential to Achieve Goals: Fair Progress towards PT goals: Progressing toward goals    Frequency    Min 2X/week      PT Plan      Co-evaluation PT/OT/SLP Co-Evaluation/Treatment: Yes Reason for Co-Treatment: For patient/therapist safety;Necessary to address cognition/behavior during functional activity PT goals addressed during session: Mobility/safety with mobility;Balance OT goals addressed during session: ADL's and self-care      AM-PAC PT 6 Clicks Mobility   Outcome Measure  Help needed turning from your back to your side while in a flat bed without using bedrails?: A Little Help needed moving from lying on your back to sitting on the side of a flat bed without using bedrails?: A Little Help needed moving to and from a bed to a chair (including a wheelchair)?: A Lot Help needed standing up from a chair using your arms (e.g., wheelchair or bedside chair)?: A Lot Help needed to walk in hospital room?: A Lot Help needed climbing 3-5 steps with a railing? : A Lot 6 Click Score: 14    End of Session   Activity Tolerance: Patient tolerated treatment well Patient left: in bed;with call bell/phone within reach;with bed alarm set   PT Visit Diagnosis: Unsteadiness on feet (R26.81);Muscle weakness (generalized) (M62.81);Other abnormalities of gait and mobility (R26.89)     Time: 1040-1058 PT Time Calculation (min) (ACUTE ONLY): 18 min  Charges:    $Therapeutic Activity: 8-22 mins PT General Charges $$ ACUTE PT VISIT: 1 Visit                    Randine Essex, PT, MPT    Randine LULLA Essex 10/17/2024, 2:15 PM

## 2024-10-18 ENCOUNTER — Inpatient Hospital Stay: Admitting: Anesthesiology

## 2024-10-18 ENCOUNTER — Inpatient Hospital Stay
Admit: 2024-10-18 | Discharge: 2024-10-18 | Disposition: A | Attending: Nurse Practitioner | Admitting: Nurse Practitioner

## 2024-10-18 ENCOUNTER — Encounter
Admission: EM | Disposition: A | Discharge status: Home / Self Care | Payer: Self-pay | Source: Home / Self Care | Attending: Internal Medicine

## 2024-10-18 DIAGNOSIS — I340 Nonrheumatic mitral (valve) insufficiency: Secondary | ICD-10-CM

## 2024-10-18 DIAGNOSIS — I4891 Unspecified atrial fibrillation: Secondary | ICD-10-CM | POA: Diagnosis not present

## 2024-10-18 DIAGNOSIS — I5031 Acute diastolic (congestive) heart failure: Secondary | ICD-10-CM | POA: Diagnosis not present

## 2024-10-18 DIAGNOSIS — I420 Dilated cardiomyopathy: Secondary | ICD-10-CM

## 2024-10-18 DIAGNOSIS — I361 Nonrheumatic tricuspid (valve) insufficiency: Secondary | ICD-10-CM | POA: Diagnosis not present

## 2024-10-18 DIAGNOSIS — J810 Acute pulmonary edema: Secondary | ICD-10-CM

## 2024-10-18 DIAGNOSIS — I5021 Acute systolic (congestive) heart failure: Secondary | ICD-10-CM | POA: Diagnosis not present

## 2024-10-18 DIAGNOSIS — I10 Essential (primary) hypertension: Secondary | ICD-10-CM | POA: Diagnosis not present

## 2024-10-18 LAB — BASIC METABOLIC PANEL WITH GFR
Anion gap: 12 (ref 5–15)
BUN: 20 mg/dL (ref 8–23)
CO2: 28 mmol/L (ref 22–32)
Calcium: 9.2 mg/dL (ref 8.9–10.3)
Chloride: 95 mmol/L — ABNORMAL LOW (ref 98–111)
Creatinine, Ser: 0.69 mg/dL (ref 0.44–1.00)
GFR, Estimated: >60 mL/min
Glucose, Bld: 125 mg/dL — ABNORMAL HIGH (ref 70–99)
Potassium: 3.3 mmol/L — ABNORMAL LOW (ref 3.5–5.1)
Sodium: 135 mmol/L (ref 135–145)

## 2024-10-18 LAB — THYROID PANEL WITH TSH
Free Thyroxine Index: 3.3 (ref 1.2–4.9)
T3 Uptake Ratio: 34 % (ref 24–39)
T4, Total: 9.6 ug/dL (ref 4.5–12.0)
TSH: 3.57 u[IU]/mL (ref 0.450–4.500)

## 2024-10-18 LAB — CBC
HCT: 46.1 % — ABNORMAL HIGH (ref 36.0–46.0)
Hemoglobin: 15.2 g/dL — ABNORMAL HIGH (ref 12.0–15.0)
MCH: 32.2 pg (ref 26.0–34.0)
MCHC: 33 g/dL (ref 30.0–36.0)
MCV: 97.7 fL (ref 80.0–100.0)
Platelets: 135 10*3/uL — ABNORMAL LOW (ref 150–400)
RBC: 4.72 MIL/uL (ref 3.87–5.11)
RDW: 13.3 % (ref 11.5–15.5)
WBC: 12.3 10*3/uL — ABNORMAL HIGH (ref 4.0–10.5)
nRBC: 0 % (ref 0.0–0.2)

## 2024-10-18 LAB — MAGNESIUM: Magnesium: 2.1 mg/dL (ref 1.7–2.4)

## 2024-10-18 MED ORDER — POTASSIUM CHLORIDE 10 MEQ/100ML IV SOLN: 10.0000 meq | INTRAVENOUS | Status: DC

## 2024-10-18 MED ORDER — LIDOCAINE VISCOUS HCL 2 % MT SOLN
OROMUCOSAL | Status: AC
  Filled 2024-10-18: qty 15

## 2024-10-18 MED ORDER — POTASSIUM CHLORIDE 20 MEQ PO PACK: 40.0000 meq | PACK | Freq: Every day | ORAL | Status: DC

## 2024-10-18 MED ORDER — FUROSEMIDE 40 MG PO TABS
80.0000 mg | ORAL_TABLET | Freq: Every day | ORAL | Status: DC
  Administered 2024-10-19 – 2024-10-25 (×6): 80 mg via ORAL
  Filled 2024-10-18 (×7): qty 2

## 2024-10-18 MED ORDER — BUTAMBEN-TETRACAINE-BENZOCAINE 2-2-14 % EX AERO
INHALATION_SPRAY | CUTANEOUS | Status: AC
  Filled 2024-10-18: qty 5

## 2024-10-18 MED ORDER — PROPOFOL 10 MG/ML IV BOLUS
INTRAVENOUS | Status: DC | PRN
  Administered 2024-10-18: 20 mg via INTRAVENOUS
  Administered 2024-10-18: 30 mg via INTRAVENOUS
  Administered 2024-10-18: 20 mg via INTRAVENOUS
  Administered 2024-10-18: 30 mg via INTRAVENOUS
  Administered 2024-10-18: 10 mg via INTRAVENOUS

## 2024-10-18 MED ORDER — METOPROLOL SUCCINATE ER 50 MG PO TB24
50.0000 mg | ORAL_TABLET | Freq: Every day | ORAL | Status: DC
  Administered 2024-10-19 – 2024-10-24 (×5): 50 mg via ORAL
  Filled 2024-10-18 (×7): qty 1

## 2024-10-18 MED ORDER — POTASSIUM CHLORIDE 20 MEQ PO PACK
40.0000 meq | PACK | ORAL | Status: DC
  Administered 2024-10-18: 40 meq via ORAL
  Filled 2024-10-18: qty 2

## 2024-10-18 MED ORDER — SODIUM CHLORIDE 0.9 % IV SOLN: INTRAVENOUS | Status: DC

## 2024-10-18 MED ORDER — POTASSIUM CHLORIDE 10 MEQ/100ML IV SOLN
10.0000 meq | Freq: Once | INTRAVENOUS | Status: AC
  Administered 2024-10-18: 10 meq via INTRAVENOUS
  Filled 2024-10-18: qty 100

## 2024-10-18 MED ORDER — SODIUM CHLORIDE 0.9 % IV SOLN: INTRAVENOUS | Status: DC | PRN

## 2024-10-18 MED ORDER — PHENYLEPHRINE 80 MCG/ML (10ML) SYRINGE FOR IV PUSH (FOR BLOOD PRESSURE SUPPORT)
PREFILLED_SYRINGE | INTRAVENOUS | Status: DC | PRN
  Administered 2024-10-18: 160 ug via INTRAVENOUS

## 2024-10-18 MED ORDER — DILTIAZEM HCL 30 MG PO TABS: 90.0000 mg | ORAL_TABLET | Freq: Three times a day (TID) | ORAL | Status: DC

## 2024-10-18 MED ORDER — POTASSIUM CHLORIDE 20 MEQ PO PACK
40.0000 meq | PACK | Freq: Once | ORAL | Status: AC
  Administered 2024-10-18: 40 meq via ORAL
  Filled 2024-10-18: qty 2

## 2024-10-18 NOTE — Anesthesia Preprocedure Evaluation (Signed)
 "                                  Anesthesia Evaluation  Patient identified by MRN, date of birth, ID band Patient awake    Reviewed: Allergy & Precautions, H&P , NPO status , Patient's Chart, lab work & pertinent test results, reviewed documented beta blocker date and time   Airway Mallampati: II   Neck ROM: full    Dental  (+) Poor Dentition   Pulmonary sleep apnea , COPD, Current Smoker and Patient abstained from smoking.   Pulmonary exam normal        Cardiovascular hypertension, On Medications negative cardio ROS Atrial Fibrillation  Rhythm:regular Rate:Normal     Neuro/Psych  PSYCHIATRIC DISORDERS Anxiety Depression     Neuromuscular disease    GI/Hepatic negative GI ROS, Neg liver ROS,,,  Endo/Other  Hypothyroidism    Renal/GU negative Renal ROS  negative genitourinary   Musculoskeletal   Abdominal   Peds  Hematology  (+) Blood dyscrasia, anemia   Anesthesia Other Findings Past Medical History: No date: Allergy No date: Anemia No date: Anxiety No date: Arthritis No date: Bicuspid aortic valve No date: Cancer (HCC)     Comment:  skin cancer No date: Hyperlipidemia No date: Hypertension No date: Hypothyroidism No date: PAF (paroxysmal atrial fibrillation) (HCC) No date: Sciatica No date: Sleep apnea     Comment:  does not use cpap No date: Thrombocytopenia Past Surgical History: No date: ABDOMINAL HYSTERECTOMY No date: APPENDECTOMY No date: BACK SURGERY     Comment:  lower No date: BILATERAL CARPAL TUNNEL RELEASE No date: BREAST CYST ASPIRATION; Left     Comment:  neg 10/08/2023: CARDIOVERSION; N/A     Comment:  Procedure: CARDIOVERSION;  Surgeon: Perla Evalene PARAS,               MD;  Location: [facility];  Service: Cardiovascular;                Laterality: N/A; No date: COLONOSCOPY 04/23/2021: COLONOSCOPY WITH PROPOFOL ; N/A     Comment:  Procedure: COLONOSCOPY WITH PROPOFOL ;  Surgeon:               Janalyn Keene NOVAK, MD;   Location: [facility];                Service: Endoscopy;  Laterality: N/A; 06/08/2020: ENTROPIAN REPAIR; Left     Comment:  Procedure: ENTROPION REPAIR, SUTURES ENTROPION REPAIR,               EXTENSIVE LEFT;  Surgeon: Ashley Greig HERO, MD;  Location:               [facility];  Service: Ophthalmology;                Laterality: Left; No date: EYE SURGERY No date: FRACTURE SURGERY No date: HERNIA REPAIR 10/04/2023: INTRAMEDULLARY (IM) NAIL INTERTROCHANTERIC; Left     Comment:  Procedure: INTRAMEDULLARY (IM) NAIL INTERTROCHANTERIC;                Surgeon: Edie Norleen PARAS, MD;  Location: [facility];                Service: Orthopedics;  Laterality: Left; No date: JOINT REPLACEMENT     Comment:  knees No date: KNEE SURGERY; Bilateral No date: KNEE SURGERY 06/23/2019: OPEN REDUCTION INTERNAL FIXATION (ORIF) DISTAL RADIAL  FRACTURE; Left  Comment:  Procedure: OPEN REDUCTION INTERNAL FIXATION (ORIF)               DISTAL RADIAL FRACTURE;  Surgeon: Kathlynn Sharper, MD;                Location: [facility];  Service: Orthopedics;  Laterality:               Left; 04/27/2020: PARATHYROIDECTOMY No date: salivary stone remove No date: SPINAL CORD STIMULATOR IMPLANT; Right 07/28/2022: SPINAL CORD STIMULATOR REMOVAL; N/A     Comment:  Procedure: REMOVAL OF SPINAL CORD STIMULATOR;  Surgeon:               Clois Fret, MD;  Location: [facility];  Service:               Neurosurgery;  Laterality: N/A; 04/27/2020: THYROIDECTOMY; Right     Comment:  Procedure: PARATHYROIDECTOMY;  Surgeon: Gladis Cough,              MD;  Location: [facility];  Service: General;  Laterality:               Right; 08/13/2015: TOTAL KNEE REVISION; Right     Comment:  Procedure: TOTAL KNEE REVISION;  Surgeon: Cough Car,               MD;  Location: [facility];  Service: Orthopedics;                Laterality: Right; BMI    Body Mass Index: 26.44 kg/m     Reproductive/Obstetrics negative OB ROS                               Anesthesia Physical Anesthesia Plan  ASA: 4  Anesthesia Plan: General   Post-op Pain Management:    Induction:   PONV Risk Score and Plan: 2  Airway Management Planned:   Additional Equipment:   Intra-op Plan:   Post-operative Plan:   Informed Consent: I have reviewed the patients History and Physical, chart, labs and discussed the procedure including the risks, benefits and alternatives for the proposed anesthesia with the patient or authorized representative who has indicated his/her understanding and acceptance.     Dental Advisory Given  Plan Discussed with: CRNA  Anesthesia Plan Comments:         Anesthesia Quick Evaluation  "

## 2024-10-18 NOTE — Progress Notes (Signed)
 Transesophageal Echocardiogram :  Indication: Persistent atrial fibrillation with RVR Requesting/ordering  physician: Dr. Laurita  Procedure: Benzocaine  spray x2 and 2 mls x 2 of viscous lidocaine  were given orally to provide local anesthesia to the oropharynx. The patient was positioned supine on the left side, bite block provided. The patient was moderately sedated with the doses of versed  and fentanyl  as detailed below.  Using digital technique an omniplane probe was advanced into the distal esophagus without incident.   Moderate sedation: 1. Sedation used: Sedation performed by anesthesia team  See report in EPIC  for complete details: In brief, transgastric imaging revealed mild to moderately reduced LV function with no RWMAs and no mural apical thrombus.  .  Estimated ejection fraction was 35-40%.  Global hypokinesis right sided cardiac chambers were mildly reduced with no evidence of pulmonary hypertension.  Imaging of the septum showed no ASD or VSD Bubble study was negative for shunt 2D and color flow confirmed no PFO  Moderately dilated right atrium, severely dilated left atrium The LA was well visualized in orthogonal views.  There was no spontaneous contrast and no thrombus in the LA and LA appendage  Mild MR  The descending thoracic aorta had no  mural aortic debris with no evidence of aneurysmal dilation or dissection Mild aortic atherosclerosis  Cardioversion to follow   [name] 10/18/2024 3:12 PM

## 2024-10-18 NOTE — Progress Notes (Signed)
" °   10/18/24 0500  Assess: MEWS Score  Temp 98.7 F (37.1 C)  BP (!) 110/57  MAP (mmHg) 74  Pulse Rate (!) 115  ECG Heart Rate (!) 128  Resp 16  SpO2 94 %  Assess: MEWS Score  MEWS Temp 0  MEWS Systolic 0  MEWS Pulse 2  MEWS RR 0  MEWS LOC 0  MEWS Score 2  MEWS Score Color Yellow  Assess: if the MEWS score is Yellow or Red  Were vital signs accurate and taken at a resting state? Yes  MEWS guidelines implemented  No, previously yellow, continue vital signs every 4 hours  Notify: Charge Nurse/RN  Name of Charge Nurse/RN Notified Eleanor  Assess: SIRS CRITERIA  SIRS Temperature  0  SIRS Respirations  0  SIRS Pulse 1  SIRS WBC 1  SIRS Score Sum  2    "

## 2024-10-18 NOTE — Progress Notes (Signed)
 Heart Failure Nurse Navigator Progress Note  PCP: Antonette Angeline ORN, NP PCP-Cardiologist: Evalene Amsterdam, MD Admission Diagnosis: Atrial fibrillation with rapid ventricular response (HCC) Acute pulmonary edema [facility]) Admitted from: Home BIB [facility]  Presentation:   [name] is a 83 y.o female.  She presented with shortness of breath and chest pain for a couple of days. History of hypertension, dyslipidemia, hypothyroidism, osteoarthritis and anxiety. BP 162/124, HR 133, RR 24 and O2 Sat 97% on room air.  Potassium 3.5.  HS-Troponin 21.  Pro BNP 4836.  EKG A-fib with RVR. CXR: Cardiomegaly with vascular congestion and interstitial prominence, likely early interstitial edema.  Suspected small bilateral effusion.   ECHO/ LVEF: 40-45%  Clinical Course:  Past Medical History:  Diagnosis Date   Allergy    Anemia    Anxiety    Arthritis    Bicuspid aortic valve    Cancer (HCC)    skin cancer   Hyperlipidemia    Hypertension    Hypothyroidism    PAF (paroxysmal atrial fibrillation) (HCC)    Sciatica    Sleep apnea    does not use cpap   Thrombocytopenia      Social History   Socioeconomic History   Marital status: Married    Spouse name: ROBERT   Number of children: 4   Years of education: Not on file   Highest education level: GED or equivalent  Occupational History   Occupation: retired   Occupation: Retired  Tobacco Use   Smoking status: Every Day    Current packs/day: 0.50    Average packs/day: 0.5 packs/day for 40.0 years (20.0 ttl pk-yrs)    Types: Cigarettes   Smokeless tobacco: Never  Vaping Use   Vaping status: Never Used  Substance and Sexual Activity   Alcohol  use: No   Drug use: No   Sexual activity: Not Currently  Other Topics Concern   Not on file  Social History Narrative   Not on file   Social Drivers of Health   Tobacco Use: High Risk (04/15/2024)   Patient History    Smoking Tobacco Use: Every Day    Smokeless Tobacco Use: Never     Passive Exposure: Not on file  Financial Resource Strain: Low Risk (04/12/2024)   Overall Financial Resource Strain (CARDIA)    Difficulty of Paying Living Expenses: Not very hard  Food Insecurity: Food Insecurity Present (10/14/2024)   Epic    Worried About Programme Researcher, Broadcasting/film/video in the Last Year: Sometimes true    Ran Out of Food in the Last Year: Never true  Transportation Needs: No Transportation Needs (10/14/2024)   Epic    Lack of Transportation (Medical): No    Lack of Transportation (Non-Medical): No  Physical Activity: Inactive (01/01/2024)   Exercise Vital Sign    Days of Exercise per Week: 0 days    Minutes of Exercise per Session: 30 min  Stress: No Stress Concern Present (04/12/2024)   [employer] of Occupational Health - Occupational Stress Questionnaire    Feeling of Stress: Only a little  Social Connections: Moderately Isolated (10/14/2024)   Social Connection and Isolation Panel    Frequency of Communication with Friends and Family: Never    Frequency of Social Gatherings with Friends and Family: Never    Attends Religious Services: Never    Database Administrator or Organizations: Yes    Attends Engineer, Structural: More than 4 times per year    Marital Status: Married  Depression (PHQ2-9): Low Risk (01/01/2024)   Depression (PHQ2-9)    PHQ-2 Score: 0  Alcohol  Screen: Low Risk (01/01/2024)   Alcohol  Screen    Last Alcohol  Screening Score (AUDIT): 0  Housing: Low Risk (10/14/2024)   Epic    Unable to Pay for Housing in the Last Year: No    Number of Times Moved in the Last Year: 0    Homeless in the Last Year: No  Utilities: Not At Risk (10/14/2024)   Epic    Threatened with loss of utilities: No  Health Literacy: Adequate Health Literacy (05/26/2023)   B1300 Health Literacy    Frequency of need for help with medical instructions: Rarely   Education Assessment and Provision:  Patients husband was present for CHF education and scheduling  her [facility]  appointment @ the CHF Clinic @ [facility].  Detailed education and instructions provided on heart failure disease management including the following:  Signs and symptoms of Heart Failure When to call the physician Importance of daily weights Low sodium diet-they eat out about twice a week.   Fluid restriction-patient drinks Diet Pepsi, Milk, Water & Tea. Husband thinks its less than 1/2 gallon.   Medication management-Husband provides patient with daily medications Anticipated future follow-up appointments  Patient education given on each of the above topics.  Patient acknowledges understanding via teach back method and acceptance of all instructions.  Education Materials:  Living Better With Heart Failure Booklet, HF zone tool, & Daily Weight Tracker Tool.  Patient has scale at home: Yes Patient has pill box at home: No.  Husband gives patient her medications in a cup.      High Risk Criteria for Readmission and/or Poor Patient Outcomes: Heart failure hospital admissions (last 6 months): 1  No Show rate: 5% Difficult social situation: Patient has Dementia.  Will be going to SNF following this discharge. Hopefully they will transport her to this appointment since her husband is in a wheelchair. Husband aware of appointment details and can bring her if needed though. Demonstrates medication adherence: Patient's husband give her medications in a cup to her when they are due.  Primary Language: English Literacy level: Reading, Writing & Comprehension  Barriers of Care:   Dementia  Daily Weights Diet & Fluid Restrictions Ambulates with a walker  Considerations/Referrals:  Referral made to Heart Failure Pharmacist Stewardship: Yes Referral made to Heart Failure CSW/NCM TOC: No Referral made to Heart & Vascular TOC clinic: Yes. [facility] 10/26/2024 @ 11:30.    Items for Follow-up on DC/TOC: Daily Weights Diet & Fluid Restrictions New CHF Diagnosis and Medications  Charmaine Pines, RN, BSN [facility] Heart Failure Navigator Secure Chat Only

## 2024-10-18 NOTE — TOC Progression Note (Addendum)
 Transition of Care [facility]) - Progression Note    Patient Details  Name: [name] MRN: [id number] Date of Birth: 1941/04/06  [facility] [place] CM/SW Contact  Shasta DELENA Daring, RN Phone Number: 10/18/2024, 11:52 AM  Clinical Narrative:     RNCM presented SNF bed offers to patient and her husband. Provided medicare.gov ratings. They selected [facility].  Notified facility and linked in hub.  Will monitor for discharge readiness and get authorization.  1:05 PM Left message with [facility], requesting auth for SNF. Left patient name, DOB and call back number.  1:27 PM:  Received call from [facility] that they will notify when shara is granted.                      Expected Discharge Plan and Services                                               Social Drivers of Health (SDOH) Interventions SDOH Screenings   Food Insecurity: Food Insecurity Present (10/14/2024)  Housing: Low Risk (10/14/2024)  Transportation Needs: No Transportation Needs (10/14/2024)  Utilities: Not At Risk (10/14/2024)  Alcohol  Screen: Low Risk (01/01/2024)  Depression (PHQ2-9): Low Risk (01/01/2024)  Financial Resource Strain: Low Risk (04/12/2024)  Physical Activity: Inactive (01/01/2024)  Social Connections: Moderately Isolated (10/14/2024)  Stress: No Stress Concern Present (04/12/2024)  Tobacco Use: High Risk (04/15/2024)  Health Literacy: Adequate Health Literacy (05/26/2023)    Readmission Risk Interventions     No data to display

## 2024-10-18 NOTE — Progress Notes (Addendum)
 "  Rounding Note   Patient Name: [name] Date of Encounter: 10/18/2024  [facility] Cardiologist: Evalene Lunger, MD   Subjective Patient seen on a.m. rounds.  She is arousable but slightly confused.  Denies any chest pain, shortness of breath, and endorses occasional palpitations.  Continues to be difficult to rate control with rates upwards of the 130s.  Weaned back off of diltiazem  drip and placed on diltiazem  90 mg every 8 hours and metoprolol  tartrate 25 mg every 6 hours.  Scheduled for TEE cardioversion at lunch today.  Scheduled Meds:  apixaban   5 mg Oral BID   atorvastatin   40 mg Oral Daily   citalopram   20 mg Oral Daily   diltiazem   90 mg Oral Q8H   donepezil   5 mg Oral QHS   furosemide   80 mg Oral BID   levothyroxine   112 mcg Oral Q0600   loratadine   10 mg Oral Daily   melatonin  10 mg Oral QHS   metoprolol  tartrate  25 mg Oral Q6H   multivitamin with minerals  1 tablet Oral Daily   potassium chloride   40 mEq Oral Q2H   potassium chloride   40 mEq Oral Daily   QUEtiapine   25 mg Oral QHS   Continuous Infusions:  sodium chloride      PRN Meds: acetaminophen  **OR** acetaminophen , haloperidol  **OR** haloperidol  lactate, magnesium  hydroxide, ondansetron  **OR** ondansetron  (ZOFRAN ) IV   Vital Signs  Vitals:   10/18/24 0500 10/18/24 0515 10/18/24 0615 10/18/24 0631  BP: (!) 110/57  102/61   Pulse: (!) 115     Resp: 16 19 20 18   Temp: 98.7 F (37.1 C)  98.5 F (36.9 C)   TempSrc: Axillary  Axillary   SpO2: 94%  95%   Weight: 67.7 kg     Height:        Intake/Output Summary (Last 24 hours) at 10/18/2024 0841 Last data filed at 10/17/2024 1952 Gross per 24 hour  Intake 120 ml  Output 150 ml  Net -30 ml      10/18/2024    5:00 AM 10/17/2024    5:00 AM 10/16/2024    4:39 AM  Last 3 Weights  Weight (lbs) 149 lb 4 oz 169 lb 12.1 oz 155 lb 13.8 oz  Weight (kg) 67.7 kg 77 kg 70.7 kg      Telemetry Atrial fibrillation with rates of 80 to 150 bpm-  Personally Reviewed  ECG  No new tracings- Personally Reviewed  Physical Exam  GEN: No acute distress.   Neck: No JVD Cardiac: IR IR, distant, no murmurs, rubs, or gallops.  Respiratory: Clear to auscultation bilaterally. GI: Soft, nontender, non-distended  MS: No edema; No deformity. Neuro:  Nonfocal  Psych: Normal affect   Labs High Sensitivity Troponin:  No results for input(s): TROPONINIHS in the last 720 hours.  Recent Labs  Lab 10/13/24 2252  TRNPT 21*       Chemistry Recent Labs  Lab 10/13/24 2252 10/14/24 0410 10/15/24 0439 10/16/24 0443 10/18/24 0346  NA 143   < > 141 141 135  K 3.5   < > 3.2* 3.5 3.3*  CL 110   < > 100 101 95*  CO2 23   < > 31 29 28   GLUCOSE 99   < > 82 98 125*  BUN 19   < > 10 17 20   CREATININE 0.69   < > 0.71 0.90 0.69  CALCIUM  9.3   < > 9.9 9.5 9.2  MG  2.1   < > 2.1 2.1 2.1  PROT 6.1*  --   --   --   --   ALBUMIN 3.9  --   --   --   --   AST 37  --   --   --   --   ALT 35  --   --   --   --   ALKPHOS 80  --   --   --   --   BILITOT 1.0  --   --   --   --   GFRNONAA >60   < > >60 >60 >60  ANIONGAP 10   < > 10 11 12    < > = values in this interval not displayed.    Lipids No results for input(s): CHOL, TRIG, HDL, LABVLDL, LDLCALC, CHOLHDL in the last 168 hours.  Hematology Recent Labs  Lab 10/14/24 0410 10/16/24 0443 10/18/24 0346  WBC 6.6 8.2 12.3*  RBC 4.25 4.52 4.72  HGB 14.0 14.8 15.2*  HCT 41.8 44.4 46.1*  MCV 98.4 98.2 97.7  MCH 32.9 32.7 32.2  MCHC 33.5 33.3 33.0  RDW 14.0 13.5 13.3  PLT 138* 133* 135*   Thyroid   Recent Labs  Lab 10/15/24 1456  TSH 3.570    BNP Recent Labs  Lab 10/13/24 2252  PROBNP 4,836.0*    DDimer No results for input(s): DDIMER in the last 168 hours.   Radiology  No results found.  Cardiac Studies 2D echo 10/05/2023: 1. Left ventricular ejection fraction, by estimation, is 50 to 55%. The  left ventricle has low normal function. Left ventricular endocardial   border not optimally defined to evaluate regional wall motion. There is  mild left ventricular hypertrophy. Left  ventricular diastolic parameters are indeterminate.   2. Right ventricular systolic function is normal. The right ventricular  size is normal. There is normal pulmonary artery systolic pressure.   3. Left atrial size was severely dilated.   4. The mitral valve is normal in structure. Mild mitral valve  regurgitation. No evidence of mitral stenosis.   5. Tricuspid valve regurgitation is mild to moderate.   6. 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 Aortic valve regurgitation is not  visualized. Aortic valve sclerosis/calcification is present, without any  evidence of aortic stenosis.   7. The inferior vena cava is normal in size with greater than 50%  respiratory variability, suggesting right atrial pressure of 3 mmHg.  __________   2D echo 10/15/2024: 1. Global hypokinesis worse in the basal to mid anteroseptal and  inferoseptal myocardium. Left ventricular ejection fraction, by  estimation, is 40 to 45%. Left ventricular ejection fraction by 3D volume  is 42 %. The left ventricle has mildly decreased  function. The left ventricle demonstrates global hypokinesis. Left  ventricular diastolic function could not be evaluated. Elevated left  ventricular end-diastolic pressure. The average left ventricular global  longitudinal strain is -13.0 %. The global  longitudinal strain is abnormal.   2. Right ventricular systolic function is normal. The right ventricular  size is normal. There is normal pulmonary artery systolic pressure.   3. Left atrial size was severely dilated.   4. Right atrial size was moderately dilated.   5. Moderate pleural effusion in the left lateral region.   6. The mitral valve is normal in structure. Mild mitral valve  regurgitation. No evidence of mitral stenosis.   7. The aortic valve is tricuspid. Aortic valve regurgitation is not  visualized. No aortic  stenosis  is present.   8. The inferior vena cava is normal in size with <50% respiratory  variability, suggesting right atrial pressure of 8 mmHg.   Patient Profile   84 y.o. female with a past medical history of postoperative atrial fibrillation following left hip surgery, bicuspid aortic valve, hypertension, hyperlipidemia, hypothyroidism, arthritis, dementia, anxiety, OSA not on CPAP, who was admitted on 10/13/2024 with A-fib and CHF.  Assessment & Plan  Paroxysmal atrial fibrillation -History of postop atrial fibrillation following ORIF in 09/2023 -Admitted 10/13/2024 with rapid A-fib with unclear duration and new finding of LV dysfunction on echocardiogram -IV diltiazem  was discontinued on 1/25 due to LV dysfunction and resumed overnight due to rates rising into the 140s -She continues to remain in atrial fibrillation with rates of 80s to 150s -She is on metoprolol  to tartrate 25 mg every 6 hours and diltiazem  90 mg every 8 hours ( has not been given) - She has been scheduled for a TEE cardioversion due to having missed doses of apixaban  in an effort to restore sinus rhythm -Recommend repeat echocardiogram in the outpatient setting if she converts back to sinus rhythm to reassess recovery of ejection fraction - She is continued on apixaban  5 mg twice daily she does not meet reduced dosing criteria for CHA2DS2-VASc or at least 5 for stroke prophylaxis Informed Consent   Shared Decision Making/Informed Consent   The risks [stroke, cardiac arrhythmias rarely resulting in the need for a temporary or permanent pacemaker, skin irritation or burns, esophageal damage, perforation (1:10,000 risk), bleeding, pharyngeal hematoma as well as other potential complications associated with conscious sedation including aspiration, arrhythmia, respiratory failure and death], benefits (treatment guidance, restoration of normal sinus rhythm, diagnostic support) and alternatives of a transesophageal echocardiogram  guided cardioversion were discussed in detail with Mr. Ricklefs and he is willing to have his wife proceed with the proceed. The procedure has been explained in detail to Mrs. Yellin to be with her baseline of dementia extensive conversation was had with her husband as well.     Acute HFrEF -LVEF 40-45%, global hypokinesis by echo this admission -Transition to oral furosemide  on 1/25 -No accurate I's and O's documented in the last 24 hours -Appears to be euvolemic on exam -Continue with beta-blocker therapy with plan to consolidate to Toprol -XL prior to discharge -GDMT continues to be limited by soft blood pressures -Can consider further escalation with SGLT2 inhibitor after cardioversion -Daily weights and I's and O's  Hypokalemia - Serum potassium 3.3 -Supplementations ordered -Recommend keeping potassium greater than 4 less than 5 -Daily BMP -Monitor/trend/replete electrolytes as needed  Bicuspid aortic valve -Partial fusion of the left and noncoronary cusp -No aortic stenosis on echocardiogram noted this admission -Continue to monitor with surveillance studies  Hypertension -Blood pressure 103/69 -Continued on metoprolol  to tartrate and diltiazem  -Vital signs per unit protocol  Hyperlipidemia -Continued on statin therapy with atorvastatin    For questions or updates, please contact [facility] Please consult [email or web address] for contact info under       Signed, Malahki Gasaway, NP  10/18/2024, 8:41 AM    "

## 2024-10-18 NOTE — Progress Notes (Signed)
 OT Cancellation Note  Patient Details Name: [name] MRN: [id number] DOB: 13-Jan-1941   Cancelled Treatment:    Reason Eval/Treat Not Completed: Patient at procedure or test/ unavailable. Pt currently off unit for cardioversion. OT will re-attempt when pt is next available.   Izetta Claude, MS, OTR/L , CBIS ascom [phone]  10/18/24, 12:53 PM

## 2024-10-18 NOTE — Progress Notes (Signed)
 " Progress Note   Patient: [name] FMW:[id number] DOB: 12/05/1940 DOA: 10/13/2024     5 DOS: the patient was seen and examined on 10/18/2024   Brief hospital course: [name] is a 84 y.o. Caucasian female with medical history significant for essential hypertension, dyslipidemia, hypothyroidism, osteoarthritis and anxiety, who presented to the ER with acute onset of worsening dyspnea with associated chest pain felt this palpitations over the last couple of days  Upon arriving to hospital, she was found to have atrial fibrillation with RVR at 133, chest x-ray showed cardiomegaly with vascular congestion, proBNP 4836. Patient was placed on beta-blocker, diltiazem  drip, and furosemide .  Cardiology consult obtained.  Cardioversion was performed on 1/27.   Principal Problem:   Atrial fibrillation with RVR (HCC) Active Problems:   Acute heart failure (HCC)   Hypertensive urgency   Hypothyroidism   Dyslipidemia   Anxiety and depression   Essential hypertension   Assessment and Plan: Chronic atrial fibrillation with RVR (HCC) Patient still has significant tachycardia, followed by cardiology, treated with beta-blocker and diltiazem  infusion.  Continued Eliquis .  Patient received digoxin , beta-blocker and diltiazem .  Status post cardioversion 1/27.  Discontinued diltiazem  due to relative low blood pressure.   Acute on chronic diastolic CHF (congestive heart failure) (HCC) Elevated troponin secondary to congestive heart failure. Hypertension urgency. - This likely diastolic as her last 2D echo on 10/05/2023 revealed an EF of 50 to 55% with indeterminate left ventricular diastolic parameter.  The left atrium was severely dilated and she had mild to moderate tricuspid regurgitation and mild mitral regurgitation with bicuspid aortic valve. Patient was treated with IV Lasix , changed to oral Lasix  80 mg twice a day 1/24 after she pulled out her IV.  Volume status much better. Continue  current diuretics   Hospital delirium. Patient was very agitated yesterday, received Haldol  as needed.  Also given Seroquel  at night, she slept better.  She is no longer agitated.   Hypokalemia. Continue replete potassium, recheck BMP tomorrow.   Mild thrombocytopenia. Still stable.   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 Diet and exercise.   Hypothyroidism - We will continue Synthroid  and check TSH level.   Essential hypertension - Will continue Toprol -XL.   Anxiety and depression - Will continue Celexa  and trazodone .   Dyslipidemia - Will continue statin therapy.      Subjective: Seen patient after cardioversion, doing well.  No short of breath.  Physical Exam: Vitals:   10/18/24 1300 10/18/24 1348 10/18/24 1400 10/18/24 1415  BP: 99/65 95/63 (!) 90/55 96/65  Pulse: (!) 115   (!) 59  Resp: 14 14  16   Temp:  97.8 F (36.6 C)    TempSrc:  Temporal    SpO2: 96% 96%  91%  Weight:      Height:       General exam: Appears calm and comfortable  Respiratory system: Clear to auscultation. Respiratory effort normal. Cardiovascular system: S1 & S2 heard, RRR. No JVD, murmurs, rubs, gallops or clicks. No pedal edema. Gastrointestinal system: Abdomen is nondistended, soft and nontender. No organomegaly or masses felt. Normal bowel sounds heard. Central nervous system: Alert and oriented. No focal neurological deficits. Extremities: Symmetric 5 x 5 power. Skin: No rashes, lesions or ulcers Psychiatry: Judgement and insight appear normal. Mood & affect appropriate.    Data Reviewed:  Lab results reviewed.  Family Communication: Husband updated the bedside.  Disposition: Status is: Inpatient Remains inpatient appropriate because: Severity of disease.  Time spent: 35 minutes  Author: Murvin Mana, MD 10/18/2024 2:42 PM  For on call review [email or web address].    "

## 2024-10-18 NOTE — Progress Notes (Signed)
" °   10/18/24 0631  Assess: MEWS Score  ECG Heart Rate (!) 132  Resp 18  Assess: MEWS Score  MEWS Temp 0  MEWS Systolic 0  MEWS Pulse 3  MEWS RR 0  MEWS LOC 0  MEWS Score 3  MEWS Score Color Yellow  Assess: if the MEWS score is Yellow or Red  Were vital signs accurate and taken at a resting state? Yes  MEWS guidelines implemented  No, previously yellow, continue vital signs every 4 hours  Notify: Charge Nurse/RN  Name of Charge Nurse/RN Notified Eleanor  Assess: SIRS CRITERIA  SIRS Temperature  0  SIRS Respirations  0  SIRS Pulse 1  SIRS WBC 1  SIRS Score Sum  2    "

## 2024-10-18 NOTE — Progress Notes (Signed)
 Dr. Laurita in at bedside to see pt. And her husband.

## 2024-10-18 NOTE — Progress Notes (Signed)
" °   10/18/24 0205  Assess: MEWS Score  BP 96/61  MAP (mmHg) 73  ECG Heart Rate (!) 138  Resp 19  Level of Consciousness Alert  Assess: MEWS Score  MEWS Temp 0  MEWS Systolic 1  MEWS Pulse 3  MEWS RR 0  MEWS LOC 0  MEWS Score 4  MEWS Score Color Red  Assess: if the MEWS score is Yellow or Red  Were vital signs accurate and taken at a resting state? Yes  MEWS guidelines implemented  No, previously yellow, continue vital signs every 4 hours  Notify: Charge Nurse/RN  Name of Charge Nurse/RN Notified Eleanor  Assess: SIRS CRITERIA  SIRS Temperature  0  SIRS Respirations  0  SIRS Pulse 1  SIRS WBC 0  SIRS Score Sum  1    "

## 2024-10-18 NOTE — CV Procedure (Signed)
 Cardioversion procedure note For atrial fibrillation, persistent.  Procedure Details:  Consent: Risks of procedure as well as the alternatives and risks of each were explained to the (patient/caregiver).  Consent for procedure obtained.  Time Out: Verified patient identification, verified procedure, site/side was marked, verified correct patient position, special equipment/implants available, medications/allergies/relevent history reviewed, required imaging and test results available.  Performed  Patient placed on cardiac monitor, pulse oximetry, supplemental oxygen as necessary.   Sedation given: propofol  IV, Dr. Myra Pacer pads placed anterior and posterior chest.   Cardioverted 1 time(s).   Cardioverted at  150 J. Synchronized biphasic Converted to NSR   Evaluation: Findings: Post procedure EKG shows: NSR Complications: None Patient did tolerate procedure well.  Time Spent Directly with the Patient:  45 minutes   Tim Brigida Scotti, M.D., Ph.D.

## 2024-10-18 NOTE — Transfer of Care (Signed)
 Immediate Anesthesia Transfer of Care Note  Patient: [name]  Procedure(s) Performed: ECHOCARDIOGRAM, TRANSESOPHAGEAL CARDIOVERSION  Patient Location: Cath Lab  Anesthesia Type:General  Level of Consciousness: drowsy, patient cooperative, and responds to stimulation  Airway & Oxygen  Therapy: Patient Spontanous Breathing and Patient connected to nasal cannula oxygen   Post-op Assessment: Report given to RN and Post -op Vital signs reviewed and stable  Post vital signs: stable  Last Vitals:  Vitals Value Taken Time  BP    Temp    Pulse    Resp    SpO2      Last Pain:  Vitals:   10/18/24 1221  TempSrc: Temporal  PainSc: 0-No pain         Complications: There were no known notable events for this encounter.

## 2024-10-19 ENCOUNTER — Encounter: Payer: Self-pay | Admitting: Cardiovascular Disease

## 2024-10-19 DIAGNOSIS — F5104 Psychophysiologic insomnia: Secondary | ICD-10-CM

## 2024-10-19 DIAGNOSIS — I10 Essential (primary) hypertension: Secondary | ICD-10-CM | POA: Diagnosis not present

## 2024-10-19 DIAGNOSIS — I5021 Acute systolic (congestive) heart failure: Secondary | ICD-10-CM | POA: Diagnosis not present

## 2024-10-19 DIAGNOSIS — R0602 Shortness of breath: Secondary | ICD-10-CM | POA: Diagnosis not present

## 2024-10-19 DIAGNOSIS — I420 Dilated cardiomyopathy: Secondary | ICD-10-CM

## 2024-10-19 DIAGNOSIS — I4891 Unspecified atrial fibrillation: Secondary | ICD-10-CM | POA: Diagnosis not present

## 2024-10-19 DIAGNOSIS — J810 Acute pulmonary edema: Secondary | ICD-10-CM

## 2024-10-19 DIAGNOSIS — F419 Anxiety disorder, unspecified: Secondary | ICD-10-CM | POA: Diagnosis not present

## 2024-10-19 LAB — BASIC METABOLIC PANEL WITH GFR
Anion gap: 11 (ref 5–15)
BUN: 20 mg/dL (ref 8–23)
CO2: 29 mmol/L (ref 22–32)
Calcium: 9.3 mg/dL (ref 8.9–10.3)
Chloride: 99 mmol/L (ref 98–111)
Creatinine, Ser: 0.73 mg/dL (ref 0.44–1.00)
GFR, Estimated: >60 mL/min
Glucose, Bld: 96 mg/dL (ref 70–99)
Potassium: 3.4 mmol/L — ABNORMAL LOW (ref 3.5–5.1)
Sodium: 139 mmol/L (ref 135–145)

## 2024-10-19 LAB — MAGNESIUM: Magnesium: 2.4 mg/dL (ref 1.7–2.4)

## 2024-10-19 LAB — ECHO TEE

## 2024-10-19 MED ORDER — AMIODARONE LOAD VIA INFUSION
150.0000 mg | Freq: Once | INTRAVENOUS | Status: AC
  Administered 2024-10-19: 150 mg via INTRAVENOUS
  Filled 2024-10-19: qty 83.34

## 2024-10-19 MED ORDER — EMPAGLIFLOZIN 10 MG PO TABS
10.0000 mg | ORAL_TABLET | Freq: Every day | ORAL | Status: DC
  Administered 2024-10-19 – 2024-10-25 (×7): 10 mg via ORAL
  Filled 2024-10-19 (×7): qty 1

## 2024-10-19 MED ORDER — TRAMADOL HCL 50 MG PO TABS
50.0000 mg | ORAL_TABLET | Freq: Four times a day (QID) | ORAL | Status: DC | PRN
  Administered 2024-10-19 – 2024-10-24 (×8): 50 mg via ORAL
  Filled 2024-10-19 (×8): qty 1

## 2024-10-19 MED ORDER — POTASSIUM CHLORIDE CRYS ER 20 MEQ PO TBCR
40.0000 meq | EXTENDED_RELEASE_TABLET | Freq: Once | ORAL | Status: AC
  Administered 2024-10-19: 40 meq via ORAL
  Filled 2024-10-19: qty 2

## 2024-10-19 MED ORDER — AMIODARONE HCL IN DEXTROSE 360-4.14 MG/200ML-% IV SOLN
60.0000 mg/h | INTRAVENOUS | Status: DC
  Administered 2024-10-20 (×2): 60 mg/h via INTRAVENOUS
  Administered 2024-10-20: 30 mg/h via INTRAVENOUS
  Administered 2024-10-21 – 2024-10-24 (×13): 60 mg/h via INTRAVENOUS
  Filled 2024-10-19 (×15): qty 200

## 2024-10-19 MED ORDER — AMIODARONE HCL IN DEXTROSE 360-4.14 MG/200ML-% IV SOLN
60.0000 mg/h | INTRAVENOUS | Status: DC
  Administered 2024-10-19 – 2024-10-20 (×2): 60 mg/h via INTRAVENOUS
  Filled 2024-10-19 (×2): qty 200

## 2024-10-19 NOTE — Plan of Care (Signed)

## 2024-10-19 NOTE — TOC Progression Note (Signed)
 Transition of Care [facility]) - Progression Note    Patient Details  Name: [name] MRN: [id number] Date of Birth: 1941/09/10  Transition of Care [facility]) CM/SW Contact  Lauraine JAYSON Carpen, LCSW Phone Number: 10/19/2024, 9:44 AM  Clinical Narrative:   Insurance is offering a peer-to-peer review. Deadline is 2:00 today. MD is aware.  Expected Discharge Plan and Services                                               Social Drivers of Health (SDOH) Interventions SDOH Screenings   Food Insecurity: Food Insecurity Present (10/14/2024)  Housing: Low Risk (10/14/2024)  Transportation Needs: No Transportation Needs (10/14/2024)  Utilities: Not At Risk (10/14/2024)  Alcohol  Screen: Low Risk (01/01/2024)  Depression (PHQ2-9): Low Risk (01/01/2024)  Financial Resource Strain: Low Risk (04/12/2024)  Physical Activity: Inactive (01/01/2024)  Social Connections: Moderately Isolated (10/14/2024)  Stress: No Stress Concern Present (04/12/2024)  Tobacco Use: High Risk (04/15/2024)  Health Literacy: Adequate Health Literacy (05/26/2023)    Readmission Risk Interventions     No data to display

## 2024-10-19 NOTE — Progress Notes (Signed)
" °  Progress Note   Patient: [name] FMW:[id number] DOB: March 07, 1941 DOA: 10/13/2024     6 DOS: the patient was seen and examined on 10/19/2024   Brief hospital course:  [name] is a 84 y.o. Caucasian female with medical history significant for essential hypertension, dyslipidemia, hypothyroidism, osteoarthritis and anxiety, who presented to the ER with acute onset of worsening dyspnea with associated chest pain felt this palpitations over the last couple of days  Upon arriving to hospital, she was found to have atrial fibrillation with RVR at 133, chest x-ray showed cardiomegaly with vascular congestion, proBNP 4836. Patient was placed on beta-blocker, diltiazem  drip, and furosemide .  Cardiology consult obtained.  Cardioversion was performed on 1/27.   Assessment and Plan:  Chronic atrial fibrillation with RVR (HCC) Status post cardioversion 1/27 Appreciate cardiology input Continue metoprolol  for rate control Continue apixaban  as primary prophylaxis for an acute stroke    Acute on chronic systolic CHF (congestive heart failure) (HCC) Elevated troponin secondary to congestive heart failure. Hypertension urgency. Secondary to rapid A-fib Last 2D echocardiogram from 01/27 showed an LVEF of 40 to 45% with global hypokinesis Continue IV Lasix , Jardiance  and metoprolol     Hospital delirium. Continue Seroquel  Continue as needed haloperidol  Back to baseline mental status    Hypokalemia. Secondary to diuretic therapy Supplemented    Mild thrombocytopenia. Stable.    Hypothyroidism Continue Synthroid      Essential hypertension Continue Toprol -XL.    Anxiety and depression Continue Celexa  and trazodone .    Physical deconditioning At baseline patient ambulates with a rolling walker She is currently very weak and has difficulty ambulating due to acute illness Continue PT Possible discharge to SNF       Subjective: No new complaints  Physical  Exam: Vitals:   10/19/24 0800 10/19/24 0835 10/19/24 0840 10/19/24 1232  BP: 123/66 123/66  108/65  Pulse:  68 77 70  Resp: 14   17  Temp:   97.6 F (36.4 C) 98.6 F (37 C)  TempSrc:      SpO2:   96% 96%  Weight:      Height:       General exam: Appears calm and comfortable  Respiratory system: BLAE, Crackles at the bases Cardiovascular system: S1 & S2 heard, RRR. No JVD, murmurs, rubs, gallops or clicks. No pedal edema. Gastrointestinal system: Abdomen is nondistended, soft and nontender. No organomegaly or masses felt. Normal bowel sounds heard. Central nervous system: Alert and oriented only to person. No focal neurological deficits. Extremities: Symmetric 5 x 5 power. Skin: No rashes, lesions or ulcers Psychiatry: Judgement and insight appear normal. Mood & affect appropriate.      Data Reviewed: Labs reviewed.  Potassium 3.4 Labs reviewed  Family Communication: Plan of care discussed with patient's son and husband over the phone.  All questions and concerns have been addressed.  They verbalized understanding and agreed with the plan  Disposition: Status is: Inpatient Remains inpatient appropriate because: Discharge planning  Planned Discharge Destination: Rehab    Time spent: 50 minutes  Author: Aimee Somerset, MD 10/19/2024 3:18 PM  For on call review [email or web address].  "

## 2024-10-19 NOTE — Progress Notes (Addendum)
 Physical Therapy Treatment Patient Details Name: [name] MRN: [id number] DOB: 03-21-1941 Today's Date: 10/19/2024   History of Present Illness [name] is an 83yoF who comes to [facility] on 1/22 with CP and SOB, found to be in AF with RVR and acute CHF. PMH HTN, , hypoTSH, osteoarthritis, dementia, and GAD. Pt underwent cardioversion on 10/18/24.    PT Comments  Pt oriented to self only throughout my visit. Husband leaves upon authors arrival. Pt continued to struggle with performing basic mobility required for ADL, maxA for bed mobility, minA to stand, modA for taking pivot steps. Pt really unable to pick her feet up off the floor with much success which precludes any meaningful walking. Pt assisted with linen clean up, transfers, and time to practice short sitting balance. Pt has neck pain throughout session, but is motivated to participate as she is able. Will continue to follow.     If plan is discharge home, recommend the following: A lot of help with bathing/dressing/bathroom;Assistance with cooking/housework;Direct supervision/assist for medications management;Direct supervision/assist for financial management;Assist for transportation;Help with stairs or ramp for entrance;Supervision due to cognitive status;Two people to help with walking and/or transfers   Can travel by private vehicle     No  Equipment Recommendations  None recommended by PT    Recommendations for Other Services       Precautions / Restrictions Precautions Precautions: Fall Recall of Precautions/Restrictions: Impaired Restrictions Weight Bearing Restrictions Per Provider Order: No     Mobility  Bed Mobility Overal bed mobility: Needs Assistance Bed Mobility: Supine to Sit, Sit to Supine     Supine to sit: Max assist, +2 for physical assistance Sit to supine: Max assist, +2 for physical assistance        Transfers   Equipment used: 1 person hand held assist, None Transfers: Sit to/from  Stand, Bed to chair/wheelchair/BSC Sit to Stand: Min assist, From elevated surface   Step pivot transfers: Min assist, Mod assist       General transfer comment: struggles with picking up feet for taking steps; author provides body weight assistance and weight shift assistance.    Ambulation/Gait Ambulation/Gait assistance:  (unable)                 Stairs             Wheelchair Mobility     Tilt Bed    Modified Rankin (Stroke Patients Only)       Balance                                            Communication    Cognition       PT - Cognitive impairments: History of cognitive impairments, Initiation, Memory                       PT - Cognition Comments: oriented to self only; ST memory limitations regarding course of care, recent pain meds.        Cueing    Exercises Other Exercises Other Exercises: EOB sitting x5 minutes, recliner sitting x10 minutes Other Exercises: step pivot bed to chair, step pivot chair to bed    General Comments        Pertinent Vitals/Pain Pain Assessment Pain Assessment: Faces Faces Pain Scale: Hurts whole lot Pain Location: Rt postero lateral neck Pain Intervention(s): Limited activity within  patient's tolerance, Monitored during session, Premedicated before session, RN gave pain meds during session, Patient requesting pain meds-RN notified, Repositioned    Home Living                          Prior Function            PT Goals (current goals can now be found in the care plan section) Acute Rehab PT Goals PT Goal Formulation: Patient unable to participate in goal setting    Frequency    Min 2X/week      PT Plan      Co-evaluation              AM-PAC PT 6 Clicks Mobility   Outcome Measure  Help needed turning from your back to your side while in a flat bed without using bedrails?: Total Help needed moving from lying on your back to sitting on the  side of a flat bed without using bedrails?: Total Help needed moving to and from a bed to a chair (including a wheelchair)?: A Lot Help needed standing up from a chair using your arms (e.g., wheelchair or bedside chair)?: A Lot Help needed to walk in hospital room?: Total Help needed climbing 3-5 steps with a railing? : Total 6 Click Score: 8    End of Session   Activity Tolerance: Patient tolerated treatment well;Patient limited by fatigue;Patient limited by pain Patient left: in bed;with call bell/phone within reach;with bed alarm set;with nursing/sitter in room Nurse Communication: Mobility status;Patient requests pain meds PT Visit Diagnosis: Unsteadiness on feet (R26.81);Muscle weakness (generalized) (M62.81);Other abnormalities of gait and mobility (R26.89)     Time: 8978-8946 PT Time Calculation (min) (ACUTE ONLY): 32 min  Charges:    $Therapeutic Activity: 23-37 mins PT General Charges $$ ACUTE PT VISIT: 1 Visit                    11:20 AM, 10/19/24 [name], PT, DPT Physical Therapist - [facility]  [phone] (ASCOM)    [name] 10/19/2024, 11:14 AM

## 2024-10-19 NOTE — Progress Notes (Signed)
 Occupational Therapy Treatment Patient Details Name: [name] MRN: [id number] DOB: Apr 13, 1941 Today's Date: 10/19/2024   History of present illness [name] is an 83yoF who comes to [facility] on 1/22 with CP and SOB, found to be in AF with RVR and acute CHF. PMH HTN, , hypoTSH, osteoarthritis, dementia, and GAD. Pt underwent cardioversion on 10/18/24.   OT comments  Upon entering the room, pt supine in bed and agreeable to OT intervention with encouragement. Pt with tangential speech and needing redirection to task. Max A for supine >sit. Pt seated on EOB with static sitting balance needing min A. Pt able to comb hair with use of mirror while seated and min A for balance. Pt seated for ~ 7 minutes but fatigues quickly. She declines attempts to stand or transfer and requests to return to bed with total A. Call bell and all needed items within reach. Bed alarm activated.       If plan is discharge home, recommend the following:  A little help with walking and/or transfers;A little help with bathing/dressing/bathroom;Supervision due to cognitive status   Equipment Recommendations  Other (comment) (defer)       Precautions / Restrictions Precautions Precautions: Fall Recall of Precautions/Restrictions: Impaired       Mobility Bed Mobility Overal bed mobility: Needs Assistance Bed Mobility: Supine to Sit, Sit to Supine     Supine to sit: Max assist Sit to supine: Total assist        Transfers                   General transfer comment: Pt declined secondary to fatigue     Balance Overall balance assessment: Needs assistance Sitting-balance support: Feet supported Sitting balance-Leahy Scale: Fair     Standing balance support: Bilateral upper extremity supported, During functional activity, Reliant on assistive device for balance Standing balance-Leahy Scale: Poor                             ADL either performed or assessed with clinical  judgement   ADL Overall ADL's : Needs assistance/impaired     Grooming: Sitting;Brushing hair;Minimal assistance                                      Extremity/Trunk Assessment Upper Extremity Assessment Upper Extremity Assessment: Generalized weakness   Lower Extremity Assessment Lower Extremity Assessment: Generalized weakness        Vision Patient Visual Report: No change from baseline           Communication Communication Communication: Impaired Factors Affecting Communication: Reduced clarity of speech;Difficulty expressing self   Cognition Arousal: Alert Behavior During Therapy: WFL for tasks assessed/performed Cognition: Cognition impaired, No family/caregiver present to determine baseline, History of cognitive impairments   Orientation impairments: Place, Time, Situation       Executive functioning impairment (select all impairments): Sequencing, Reasoning, Problem solving                   Following commands: Impaired Following commands impaired: Follows one step commands with increased time      Cueing   Cueing Techniques: Verbal cues, Tactile cues, Visual cues             Pertinent Vitals/ Pain       Pain Assessment Pain Assessment: 0-10 Pain Score: 7  Pain Location: R lateral  neck Pain Descriptors / Indicators: Discomfort, Sore Pain Intervention(s): Limited activity within patient's tolerance, Monitored during session, Repositioned         Frequency  Min 2X/week        Progress Toward Goals  OT Goals(current goals can now be found in the care plan section)  Progress towards OT goals: Progressing toward goals      AM-PAC OT 6 Clicks Daily Activity     Outcome Measure   Help from another person eating meals?: A Little Help from another person taking care of personal grooming?: A Little Help from another person toileting, which includes using toliet, bedpan, or urinal?: Total Help from another person  bathing (including washing, rinsing, drying)?: Total Help from another person to put on and taking off regular upper body clothing?: A Lot Help from another person to put on and taking off regular lower body clothing?: Total 6 Click Score: 11    End of Session    OT Visit Diagnosis: Other abnormalities of gait and mobility (R26.89);Muscle weakness (generalized) (M62.81);Other symptoms and signs involving cognitive function   Activity Tolerance Patient tolerated treatment well   Patient Left in bed;with call bell/phone within reach;with bed alarm set   Nurse Communication          Time: 8476-8464 OT Time Calculation (min): 12 min  Charges: OT General Charges $OT Visit: 1 Visit OT Treatments $Self Care/Home Management : 8-22 mins  Izetta Claude, MS, OTR/L , CBIS ascom [phone]  10/19/24, 3:40 PM

## 2024-10-19 NOTE — Progress Notes (Addendum)
 CCMD called and reported that pt HR sustaining from 126-140' on afib. NP Foust made aware.   Update 2152: BP at 115/67 MAP 83, HR 130. NP Foust made aware.  Update 2257: See new orders.

## 2024-10-19 NOTE — Plan of Care (Signed)

## 2024-10-19 NOTE — Consult Note (Signed)
 "                                                  Palliative Care Consult Note                                  Date: 10/19/2024   Patient Name: [name]  DOB:Feb 08, 1941  FMW:[id number]  Age / Sex:84 y.o., female  PCP: Antonette Angeline ORN, NP Referring Physician: Lanetta Lingo, MD  Reason for Consultation: Establishing goals of care  Past Medical History:  Diagnosis Date   Allergy    Anemia    Anxiety    Arthritis    Bicuspid aortic valve    Cancer (HCC)    skin cancer   Hyperlipidemia    Hypertension    Hypothyroidism    PAF (paroxysmal atrial fibrillation) (HCC)    Sciatica    Sleep apnea    does not use cpap   Thrombocytopenia      Assessment & Plan:   HPI/Patient Profile: 84 y.o. 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 Per H&P by [facility] MD on 10/13/2024 patient presented after a couple of days of dyspnea and chest pain most 2/2 atrial fibrillation, and PND. ProBNP on 10/13/2024 elevated to 4836, high sensitivity troponin elevated at 21 most likely due to CHF. ECHO on 10/18/2024 demonstrated EF of 35-40% which is changed from prior echo on 10/05/2023 with EF of 50-55%. CXR on 10/13/2024 demonstrated cardiomegaly with vascular congestion, likely early interstitial edema and suspected small bilateral effusion.   Palliative medicine consulted for goals of care conversation.  SUMMARY OF RECOMMENDATIONS   DNR-limited Continue current measures with goal of rehab and eventual return to home Next of kin decision maker is husband - Rhyen Mazariego, patient has HCPOA and living will documentation that husband will attempt to bring in Palliative medicine will continue to follow  Symptom Management:  Per primary team  Code Status: DNR - Limited (DNR/DNI)  Prognosis:  Unable to determine  Discharge Planning:  To Be  Determined   Discussed with: Agbata MD 10/19/2024 about patient's wish to be DNR-limited and plan to continue current measures with desire to pursue rehab and eventual goal of returning home.    Subjective:   Reviewed medical records, received report from team, assessed the patient and then meet at the patient's bedside to discuss diagnosis, prognosis, GOC, EOL wishes disposition and options.  I met with patient and husband at bedside.   We meet to discuss diagnosis prognosis, GOC, EOL wishes, disposition and options. Concept of Palliative Care was introduced as specialized medical care for people and their families living with serious illness.  If focuses on providing relief from the symptoms and stress of a serious illness.  The goal is to improve quality of life for both the patient and the family. Values and goals of care important to patient and family were attempted to be elicited.  Created space and opportunity for patient  and family to explore thoughts and feelings regarding current medical situation   Natural trajectory and current clinical status were discussed. Questions and concerns addressed. Patient encouraged to call with questions or  concerns.    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 - Married to [employer] since 1996, but have known each other since 1983 - They have 4 children who are all boys  - They enjoyed partying, drinking, and riding motorcycles together, they traveled together and went [place] trips [place]  Patient Values: - Patient and family values being independent and as functional as possible  Baseline Status: - Lamar shares that the patient is independent with most ADLs, she can bathe, dress, toilet herself without any assistance at home - Uses a walker to help her ambulate and denies any recent deterioration in overall functional status - Denies any weight changes and reports that she continues to eat well - Lamar is usually with Cathlean 24/7 besides from a weekly gathering he has with his friends  Today's Discussion: - Reviewed with patient overall health and function prior to admission, current admission and interventions, and discussed next steps regarding discharge - Reviewed current medical admission and interventions completed including that she initially presented for chest pain and shortness of breath which turned out to be due to her atrial fibrillation which was cardioverted on 10/18/2024 and fluid management with diuretics, and recent echocardiogram which demonstrated worsening of her ejection fraction  - Cathlean and Lamar share that overall things have been going well without any change in her functional status in the last year, does endorse that a couple weeks prior to admission she did become more lethargic and had less energy, endorsed that she did forget to take her medications but only a handful of times in a month, not concerning for any questionable medication compliance - Inquired if Baker or Lamar had any concerns about her health and Lamar shares that he is concerned that she does not exercise as much as she needs to, he expressed it to her and she shares that she will do the best she can, but overall they do not have any concern for the patient's overall functional status - Inquired about a living will and advanced care planning and they shared that they have a living will and healthcare power of attorney documentation, they endorsed that Rheba Diamond is  primary HCPOA in case the patient loses capacity and their eldest son Leafy Search) to be secondary in case Lamar is unavailable - Inquired about what the living will states and they confirm that the patient would not want her life to be prolonged in the case of an incurable disease, vegetative state, or dementia, requested husband to bring that in so that the medical team can have it scanned into the EMR - Reviewed code status with the patient and she confirmed that she would not want to be resuscitated and to let her go in case her heart were to stop and she were to stop breathing, confirmed with her that I will make her DNR in the electronic record and have a physical copy placed in her physical chart - Reviewed next steps regarding possible discharge and that the current medical advice is for her to pursue rehab to  rebuild the strength she has lost while she has been hospitalized, patient initially inquired why she cannot just go home but educated her that most times patients do not realize they are becoming weaker just by laying in bed when they are in the hospital, they become debilitated without realizing, she acknowledged that she is getting weaker and agreeable to pursue rehab prior to discharging home, she says she will do what needs to be done the best she can so that she can get home  Review of Systems  Constitutional:  Positive for activity change and appetite change.  All other systems reviewed and are negative.   Objective:   Primary Diagnoses: Present on Admission:  Atrial fibrillation with RVR (HCC)  SOB (shortness of breath)   Vital Signs:  BP 108/65 (BP Location: Left Arm)   Pulse 70   Temp 98.6 F (37 C)   Resp 17   Ht 5' 3 (1.6 m)   Wt 67.7 kg   LMP  (LMP Unknown)   SpO2 96%   BMI 26.44 kg/m   Physical Exam HENT:     Head: Normocephalic and atraumatic.  Eyes:     Extraocular Movements: Extraocular movements intact.  Cardiovascular:     Rate and Rhythm:  Normal rate.  Pulmonary:     Effort: Pulmonary effort is normal.  Skin:    General: Skin is warm and dry.  Neurological:     Mental Status: She is alert.     Comments: Oriented to self and location. Forgetful at times and trouble with word finding.   Psychiatric:        Mood and Affect: Mood normal.    Palliative Assessment/Data: 60%   Existing Vynca/ACP Documentation: None, pending family providing copy of HPCOA and Living Will  Primary Decision Maker: PATIENT, husband - Tecla Mailloux as primary and son - Daril Search as secondary  I personally spent a total of 75 minutes in the care of the patient today including preparing to see the patient, getting/reviewing separately obtained history, performing a medically appropriate exam/evaluation, counseling and educating, placing orders, referring and communicating with other health care professionals, and documenting clinical information in the EHR.  Signed by: Fairy FORBES Shan DEVONNA Palliative Medicine Team  Team Phone # [phone] (Nights/Weekends)  10/19/2024, 1:43 PM   "

## 2024-10-19 NOTE — Progress Notes (Signed)
 "  Rounding Note   Patient Name: [name] Date of Encounter: 10/19/2024  [facility] Cardiologist: Evalene Lunger, MD   Subjective Patient seen on a.m. rounds.  Denies any chest pain or shortness of breath.  Has maintained sinus rhythm since her cardioversion.  Scheduled Meds:  apixaban   5 mg Oral BID   atorvastatin   40 mg Oral Daily   citalopram   20 mg Oral Daily   donepezil   5 mg Oral QHS   furosemide   80 mg Oral Daily   levothyroxine   112 mcg Oral Q0600   loratadine   10 mg Oral Daily   melatonin  10 mg Oral QHS   metoprolol  succinate  50 mg Oral Daily   multivitamin with minerals  1 tablet Oral Daily   potassium chloride   40 mEq Oral Once   QUEtiapine   25 mg Oral QHS   Continuous Infusions:   PRN Meds: acetaminophen  **OR** acetaminophen , haloperidol  **OR** haloperidol  lactate, magnesium  hydroxide, ondansetron  **OR** ondansetron  (ZOFRAN ) IV, traMADol    Vital Signs  Vitals:   10/19/24 0400 10/19/24 0800 10/19/24 0835 10/19/24 0840  BP: (!) 121/59 123/66 123/66   Pulse:   68 77  Resp: 18 14    Temp: 98.3 F (36.8 C)   97.6 F (36.4 C)  TempSrc: Oral     SpO2: 97%   96%  Weight:      Height:        Intake/Output Summary (Last 24 hours) at 10/19/2024 1206 Last data filed at 10/19/2024 1006 Gross per 24 hour  Intake 510 ml  Output --  Net 510 ml      10/18/2024    5:00 AM 10/17/2024    5:00 AM 10/16/2024    4:39 AM  Last 3 Weights  Weight (lbs) 149 lb 4 oz 169 lb 12.1 oz 155 lb 13.8 oz  Weight (kg) 67.7 kg 77 kg 70.7 kg      Telemetry Sinus rhythm- Personally Reviewed  ECG  No new tracings- Personally Reviewed  Physical Exam  GEN: No acute distress.   Neck: No JVD Cardiac: RRR, distant, no murmurs, rubs, or gallops.  Respiratory: Clear to auscultation bilaterally. GI: Soft, nontender, non-distended  MS: No edema; No deformity. Neuro:  Nonfocal  Psych: Normal affect   Labs High Sensitivity Troponin:  No results for input(s):  TROPONINIHS in the last 720 hours.  Recent Labs  Lab 10/13/24 2252  TRNPT 21*       Chemistry Recent Labs  Lab 10/13/24 2252 10/14/24 0410 10/16/24 0443 10/18/24 0346 10/19/24 0556  NA 143   < > 141 135 139  K 3.5   < > 3.5 3.3* 3.4*  CL 110   < > 101 95* 99  CO2 23   < > 29 28 29   GLUCOSE 99   < > 98 125* 96  BUN 19   < > 17 20 20   CREATININE 0.69   < > 0.90 0.69 0.73  CALCIUM  9.3   < > 9.5 9.2 9.3  MG 2.1   < > 2.1 2.1 2.4  PROT 6.1*  --   --   --   --   ALBUMIN 3.9  --   --   --   --   AST 37  --   --   --   --   ALT 35  --   --   --   --   ALKPHOS 80  --   --   --   --  BILITOT 1.0  --   --   --   --   GFRNONAA >60   < > >60 >60 >60  ANIONGAP 10   < > 11 12 11    < > = values in this interval not displayed.    Lipids No results for input(s): CHOL, TRIG, HDL, LABVLDL, LDLCALC, CHOLHDL in the last 168 hours.  Hematology Recent Labs  Lab 10/14/24 0410 10/16/24 0443 10/18/24 0346  WBC 6.6 8.2 12.3*  RBC 4.25 4.52 4.72  HGB 14.0 14.8 15.2*  HCT 41.8 44.4 46.1*  MCV 98.4 98.2 97.7  MCH 32.9 32.7 32.2  MCHC 33.5 33.3 33.0  RDW 14.0 13.5 13.3  PLT 138* 133* 135*   Thyroid   Recent Labs  Lab 10/15/24 1456  TSH 3.570    BNP Recent Labs  Lab 10/13/24 2252  PROBNP 4,836.0*    DDimer No results for input(s): DDIMER in the last 168 hours.   Radiology  No results found.  Cardiac Studies 2D echo 10/05/2023: 1. Left ventricular ejection fraction, by estimation, is 50 to 55%. The  left ventricle has low normal function. Left ventricular endocardial  border not optimally defined to evaluate regional wall motion. There is  mild left ventricular hypertrophy. Left  ventricular diastolic parameters are indeterminate.   2. Right ventricular systolic function is normal. The right ventricular  size is normal. There is normal pulmonary artery systolic pressure.   3. Left atrial size was severely dilated.   4. The mitral valve is normal in structure.  Mild mitral valve  regurgitation. No evidence of mitral stenosis.   5. Tricuspid valve regurgitation is mild to moderate.   6. 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 Aortic valve regurgitation is not  visualized. Aortic valve sclerosis/calcification is present, without any  evidence of aortic stenosis.   7. The inferior vena cava is normal in size with greater than 50%  respiratory variability, suggesting right atrial pressure of 3 mmHg.  __________   2D echo 10/15/2024: 1. Global hypokinesis worse in the basal to mid anteroseptal and  inferoseptal myocardium. Left ventricular ejection fraction, by  estimation, is 40 to 45%. Left ventricular ejection fraction by 3D volume  is 42 %. The left ventricle has mildly decreased  function. The left ventricle demonstrates global hypokinesis. Left  ventricular diastolic function could not be evaluated. Elevated left  ventricular end-diastolic pressure. The average left ventricular global  longitudinal strain is -13.0 %. The global  longitudinal strain is abnormal.   2. Right ventricular systolic function is normal. The right ventricular  size is normal. There is normal pulmonary artery systolic pressure.   3. Left atrial size was severely dilated.   4. Right atrial size was moderately dilated.   5. Moderate pleural effusion in the left lateral region.   6. The mitral valve is normal in structure. Mild mitral valve  regurgitation. No evidence of mitral stenosis.   7. The aortic valve is tricuspid. Aortic valve regurgitation is not  visualized. No aortic stenosis is present.   8. The inferior vena cava is normal in size with <50% respiratory  variability, suggesting right atrial pressure of 8 mmHg.   Patient Profile   84 y.o. female with a past medical history of postoperative atrial fibrillation following left hip surgery, bicuspid aortic valve, hypertension, hyperlipidemia, hypothyroidism, arthritis, dementia, anxiety, OSA not on CPAP, who was admitted  on 10/13/2024 with A-fib and CHF.  Assessment & Plan  Paroxysmal atrial fibrillation -History of postop atrial fibrillation following ORIF  in 09/2023 -Admitted 10/13/2024 with rapid A-fib with unclear duration and new finding of LV dysfunction on echocardiogram -IV diltiazem  was discontinued on 1/25 due to LV dysfunction and resumed overnight due to rates rising into the 140s -Currently maintaining sinus rhythm since TEE cardioversion yesterday -Patient continued on Toprol -XL 50 mg daily -Recommend repeat echocardiogram in the outpatient setting if she converts back to sinus rhythm to reassess recovery of ejection fraction - She is continued on apixaban  5 mg twice daily she does not meet reduced dosing criteria for CHA2DS2-VASc or at least 5 for stroke prophylaxis  Acute HFrEF -LVEF 40-45%, global hypokinesis by echo this admission -Transition to oral furosemide  on 1/25 -No accurate I's and O's documented in the last 24 hours -Appears to be euvolemic on exam -Continued on Toprol -XL -GDMT continues to be limited by soft blood pressures -Can consider further escalation with SGLT2 inhibitor after cardioversion -Daily weights and I's and O's  Hypokalemia - Serum potassium 3.4 -Supplementations ordered -Recommend keeping potassium greater than 4 less than 5 -Daily BMP -Monitor/trend/replete electrolytes as needed  Bicuspid aortic valve -Partial fusion of the left and noncoronary cusp -No aortic stenosis on echocardiogram noted this admission -Continue to monitor with surveillance studies  Hypertension -Blood pressure 123/66 -Continued on Toprol -XL -Vital signs per unit protocol  Hyperlipidemia -Continued on statin therapy with atorvastatin    For questions or updates, please contact [facility] Please consult [email or web address] for contact info under       Signed, Cammeron Greis, NP  10/19/2024, 12:06 PM    "

## 2024-10-20 DIAGNOSIS — I4891 Unspecified atrial fibrillation: Secondary | ICD-10-CM | POA: Diagnosis not present

## 2024-10-20 DIAGNOSIS — F5104 Psychophysiologic insomnia: Secondary | ICD-10-CM | POA: Diagnosis not present

## 2024-10-20 DIAGNOSIS — J810 Acute pulmonary edema: Secondary | ICD-10-CM | POA: Diagnosis not present

## 2024-10-20 DIAGNOSIS — F419 Anxiety disorder, unspecified: Secondary | ICD-10-CM

## 2024-10-20 NOTE — Progress Notes (Signed)
" °   10/19/24 2258  Assess: MEWS Score  Temp 98.5 F (36.9 C)  BP 111/81  MAP (mmHg) 92  ECG Heart Rate (!) 137  Resp 18  SpO2 95 %  O2 Device Room Air  Assess: MEWS Score  MEWS Temp 0  MEWS Systolic 0  MEWS Pulse 3  MEWS RR 0  MEWS LOC 0  MEWS Score 3  MEWS Score Color Yellow  Assess: if the MEWS score is Yellow or Red  Were vital signs accurate and taken at a resting state? Yes  Does the patient meet 2 or more of the SIRS criteria? No  MEWS guidelines implemented  Yes, yellow  Treat  MEWS Interventions Considered administering scheduled or prn medications/treatments as ordered  Take Vital Signs  Increase Vital Sign Frequency  Yellow: Q2hr x1, continue Q4hrs until patient remains green for 12hrs  Escalate  MEWS: Escalate Yellow: Discuss with charge nurse and consider notifying provider and/or RRT  Notify: Charge Nurse/RN  Name of Charge Nurse/RN Notified Dickey BRAVO, Rn  Provider Notification  Provider Name/Title Rockie Rams NP  Date Provider Notified 10/19/24  Time Provider Notified 2132  Method of Notification Page  Notification Reason Critical Result (Pt went to afib RVR from SR)  Provider response See new orders  Date of Provider Response 10/19/24  Time of Provider Response 2257  Assess: SIRS CRITERIA  SIRS Temperature  0  SIRS Respirations  0  SIRS Pulse 1  SIRS WBC 0  SIRS Score Sum  1    "

## 2024-10-20 NOTE — Progress Notes (Signed)
 "  Progress Note  Patient Name: [name] Date of Encounter: 10/20/2024 [facility] Cardiologist: Evalene Lunger, MD   Interval Summary    Patient went back into afib 1/28 ~ 9:20 pm. She remains in Afib with HR 110-130s. She has baseline dementia. No symptoms reported.   Vital Signs Vitals:   10/20/24 0400 10/20/24 0500 10/20/24 0505 10/20/24 0906  BP: 115/68 103/81 104/79 124/84  Pulse: (!) 105 (!) 111 (!) 114 (!) 105  Resp: 18 11 18 17   Temp: 99.5 F (37.5 C)   98.7 F (37.1 C)  TempSrc: Oral     SpO2: 96% 97% 96% 97%  Weight:  69.5 kg    Height:        Intake/Output Summary (Last 24 hours) at 10/20/2024 0952 Last data filed at 10/20/2024 0749 Gross per 24 hour  Intake 1400.9 ml  Output 900 ml  Net 500.9 ml      10/20/2024    5:00 AM 10/18/2024    5:00 AM 10/17/2024    5:00 AM  Last 3 Weights  Weight (lbs) 153 lb 3.5 oz 149 lb 4 oz 169 lb 12.1 oz  Weight (kg) 69.5 kg 67.7 kg 77 kg      Telemetry/ECG  SR>Afib ~ 9:20 pm 1/28 - Personally Reviewed  Physical Exam  GEN: No acute distress.   Neck: No JVD Cardiac: Irreg Irreg, tachy, no murmurs, rubs, or gallops.  Respiratory: Clear to auscultation bilaterally. GI: Soft, nontender, non-distended  MS: No edema  Cardiac Studies 2D echo 10/05/2023: 1. Left ventricular ejection fraction, by estimation, is 50 to 55%. The  left ventricle has low normal function. Left ventricular endocardial  border not optimally defined to evaluate regional wall motion. There is  mild left ventricular hypertrophy. Left  ventricular diastolic parameters are indeterminate.   2. Right ventricular systolic function is normal. The right ventricular  size is normal. There is normal pulmonary artery systolic pressure.   3. Left atrial size was severely dilated.   4. The mitral valve is normal in structure. Mild mitral valve  regurgitation. No evidence of mitral stenosis.   5. Tricuspid valve regurgitation is mild to moderate.    6. 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 Aortic valve regurgitation is not  visualized. Aortic valve sclerosis/calcification is present, without any  evidence of aortic stenosis.   7. The inferior vena cava is normal in size with greater than 50%  respiratory variability, suggesting right atrial pressure of 3 mmHg.  __________   2D echo 10/15/2024: 1. Global hypokinesis worse in the basal to mid anteroseptal and  inferoseptal myocardium. Left ventricular ejection fraction, by  estimation, is 40 to 45%. Left ventricular ejection fraction by 3D volume  is 42 %. The left ventricle has mildly decreased  function. The left ventricle demonstrates global hypokinesis. Left  ventricular diastolic function could not be evaluated. Elevated left  ventricular end-diastolic pressure. The average left ventricular global  longitudinal strain is -13.0 %. The global  longitudinal strain is abnormal.   2. Right ventricular systolic function is normal. The right ventricular  size is normal. There is normal pulmonary artery systolic pressure.   3. Left atrial size was severely dilated.   4. Right atrial size was moderately dilated.   5. Moderate pleural effusion in the left lateral region.   6. The mitral valve is normal in structure. Mild mitral valve  regurgitation. No evidence of mitral stenosis.   7. The aortic valve is tricuspid. Aortic valve regurgitation  is not  visualized. No aortic stenosis is present.   8. The inferior vena cava is normal in size with <50% respiratory  variability, suggesting right atrial pressure of 8 mmHg.   __________  Echo TEE 10/19/23  1. Left ventricular ejection fraction, by estimation, is 35 to 40%. The  left ventricle has moderately decreased function. The left ventricle  demonstrates global hypokinesis.   2. Right ventricular systolic function is normal. The right ventricular  size is normal. There is normal pulmonary artery systolic pressure. The  estimated right  ventricular systolic pressure is 20.4 mmHg.   3. Left atrial size was severely dilated. No left atrial/left atrial  appendage thrombus was detected.   4. Right atrial size was moderately dilated.   5. The mitral valve is normal in structure. Mild mitral valve  regurgitation. No evidence of mitral stenosis.   6. Tricuspid valve regurgitation is mild to moderate.   7. The aortic valve is tricuspid. Aortic valve regurgitation is mild. No  aortic stenosis is present.   8. There is Moderate (Grade III) atheroma plaque involving the ascending  aorta, aortic arch and descending aorta.   9. The inferior vena cava is normal in size with greater than 50%  respiratory variability, suggesting right atrial pressure of 3 mmHg.  10. Agitated saline contrast bubble study was negative, with no evidence  of any interatrial shunt  11.    Patient Profile   84 y.o. female with a past medical history of postoperative atrial fibrillation following left hip surgery, bicuspid aortic valve, hypertension, hyperlipidemia, hypothyroidism, arthritis, dementia, anxiety, OSA not on CPAP, who was admitted on 10/13/2024 with A-fib and CHF.  Assessment & Plan   Paroxysmal Afib - h/o post op Afib following ORIF in 09/2023 - admitted 1/22 with rapid afib on unclear duration - TEE showed LVEF 35-40% - s/p TEE/DVVT 1/27, then went back into Afib 1/28 pm - IV dilt stopped due to LV dysfunction  - Toprol  50mg  daily, cannot increase due to low pressures - she remains in Afib with rates 110-130. - started on IV amio - may need repeat cardioversion if she doesn't medically convert  Acute HFrEF - LVEF 35-40% on TEE - echo showed LVEF 40-45% - low EF in the setting of afib - euvolemic on exam - Jardiance  10mg  daily - Toprol  50mg  daily - lasix  80mg  daily - BP may not tolerate spiro  Hypokalemia - K 3.4 - would benefit from spiro  Bicuspid aortic valve - partial fusion on the left and noncoronary cusp - no AS on  echo - continue to monitor as OP  HTN - BP was soft overnight - toprol , Jardiance , lasix     For questions or updates, please contact [facility] Please consult [email or web address] for contact info under         Signed, Muadh Creasy VEAR Fishman, PA-C   "

## 2024-10-20 NOTE — Progress Notes (Signed)
 " Progress Note   Patient: [name] FMW:[id number] DOB: [birth year] DOA: 10/13/2024     7 DOS: the patient was seen and examined on 10/20/2024   Brief hospital course:  [name] is a 84 y.o. Caucasian female with medical history significant for essential hypertension, dyslipidemia, hypothyroidism, osteoarthritis and anxiety, who presented to the ER with acute onset of worsening dyspnea with associated chest pain felt this palpitations over the last couple of days  Upon arriving to hospital, she was found to have atrial fibrillation with RVR at 133, chest x-ray showed cardiomegaly with vascular congestion, proBNP 4836. Patient was placed on beta-blocker, diltiazem  drip, and furosemide .  Cardiology consult obtained.  Cardioversion was performed on 1/27.   01/27 -status post cardioversion, received 150 J synchronized biphasic and converted to normal sinus rhythm.  Started on Toprol  XL 50 mg 01/28 -remains in normal sinus rhythm 01/29 -overnight found to be in rapid A-fib and started on amiodarone  drip.  Per cardiology if patient remains in A-fib with RVR will consider repeat cardioversion on 10/24/24   Assessment and Plan:  Chronic atrial fibrillation with RVR (HCC) Status post cardioversion 1/27 Appreciate cardiology input Patient back in rapid A-fib and was started on an amiodarone  bolus and currently on a maintenance infusion.  Appreciate cardiology input Continue metoprolol  for rate control Continue apixaban  as primary prophylaxis for an acute stroke If patient remains in rapid A-fib, cardiology plans to repeat cardioversion on 10/24/24     Acute on chronic systolic CHF (congestive heart failure) (HCC) Elevated troponin secondary to congestive heart failure. Hypertensive urgency. Secondary to rapid A-fib Last 2D echocardiogram from 01/26 showed an LVEF of 35 - 40 % with global hypokinesis Continue Lasix , Jardiance  and metoprolol      Hospital delirium. Continue  Seroquel  Continue as needed haloperidol  Back to baseline mental status     Hypokalemia. Secondary to diuretic therapy Supplemented     Mild thrombocytopenia. Stable.     Hypothyroidism Continue Synthroid       Essential hypertension Continue Toprol -XL.     Anxiety and depression Continue Celexa  and trazodone .     Physical deconditioning At baseline patient ambulates with a rolling walker She is currently very weak and has difficulty ambulating due to acute illness Continue PT Possible discharge to SNF             Subjective: Sitting up in bed.  Physical Exam: Vitals:   10/20/24 0500 10/20/24 0505 10/20/24 0906 10/20/24 1311  BP: 103/81 104/79 124/84 98/65  Pulse: (!) 111 (!) 114 (!) 105 96  Resp: 11 18 17 17   Temp:   98.7 F (37.1 C) 98.5 F (36.9 C)  TempSrc:      SpO2: 97% 96% 97% 97%  Weight: 69.5 kg     Height:       General exam: Appears calm and comfortable  Respiratory system: BLAE, Crackles at the bases Cardiovascular system: S1 & S2 heard, irregularly irregular. No JVD, murmurs, rubs, gallops or clicks. No pedal edema. Gastrointestinal system: Abdomen is nondistended, soft and nontender. No organomegaly or masses felt. Normal bowel sounds heard. Central nervous system: Alert and oriented only to person. No focal neurological deficits. Extremities: Symmetric 5 x 5 power. Skin: No rashes, lesions or ulcers Psychiatry:Mood & affect appropriate.      Data Reviewed:  There are no new results to review at this time.  Family Communication: Plan of care discussed with patient's husband over the phone.  All questions and concerns have been addressed.  Disposition: Status is: Inpatient Remains inpatient appropriate because: On IV amiodarone   Planned Discharge Destination: Skilled nursing facility    Time spent: 50 minutes  Author: Aimee Somerset, MD 10/20/2024 1:40 PM  For on call review [email or web address].  "

## 2024-10-20 NOTE — TOC Progression Note (Signed)
 Transition of Care [facility]) - Progression Note    Patient Details  Name: [name] MRN: [id number] Date of Birth: 02-14-1941  [facility] CM/SW Contact  Lauraine JAYSON Carpen, LCSW Phone Number: 10/20/2024, 9:52 AM  Clinical Narrative:  [employer] liaison will follow up with their medical director.   Expected Discharge Plan and Services                                               Social Drivers of Health (SDOH) Interventions SDOH Screenings   Food Insecurity: Food Insecurity Present (10/14/2024)  Housing: Low Risk (10/14/2024)  Transportation Needs: No Transportation Needs (10/14/2024)  Utilities: Not At Risk (10/14/2024)  Alcohol  Screen: Low Risk (01/01/2024)  Depression (PHQ2-9): Low Risk (01/01/2024)  Financial Resource Strain: Low Risk (04/12/2024)  Physical Activity: Inactive (01/01/2024)  Social Connections: Moderately Isolated (10/14/2024)  Stress: No Stress Concern Present (04/12/2024)  Tobacco Use: High Risk (04/15/2024)  Health Literacy: Adequate Health Literacy (05/26/2023)    Readmission Risk Interventions     No data to display

## 2024-10-21 DIAGNOSIS — I5020 Unspecified systolic (congestive) heart failure: Secondary | ICD-10-CM | POA: Diagnosis not present

## 2024-10-21 DIAGNOSIS — I4891 Unspecified atrial fibrillation: Secondary | ICD-10-CM | POA: Diagnosis not present

## 2024-10-21 DIAGNOSIS — I4819 Other persistent atrial fibrillation: Secondary | ICD-10-CM

## 2024-10-21 DIAGNOSIS — F03B0 Unspecified dementia, moderate, without behavioral disturbance, psychotic disturbance, mood disturbance, and anxiety: Secondary | ICD-10-CM

## 2024-10-21 LAB — RENAL FUNCTION PANEL
Albumin: 3.5 g/dL (ref 3.5–5.0)
Anion gap: 13 (ref 5–15)
BUN: 18 mg/dL (ref 8–23)
CO2: 27 mmol/L (ref 22–32)
Calcium: 9.7 mg/dL (ref 8.9–10.3)
Chloride: 95 mmol/L — ABNORMAL LOW (ref 98–111)
Creatinine, Ser: 0.7 mg/dL (ref 0.44–1.00)
GFR, Estimated: >60 mL/min
Glucose, Bld: 121 mg/dL — ABNORMAL HIGH (ref 70–99)
Phosphorus: 3.6 mg/dL (ref 2.5–4.6)
Potassium: 3.2 mmol/L — ABNORMAL LOW (ref 3.5–5.1)
Sodium: 134 mmol/L — ABNORMAL LOW (ref 135–145)

## 2024-10-21 LAB — MAGNESIUM: Magnesium: 2.5 mg/dL — ABNORMAL HIGH (ref 1.7–2.4)

## 2024-10-21 MED ORDER — POLYETHYLENE GLYCOL 3350 17 G PO PACK
34.0000 g | PACK | Freq: Every day | ORAL | Status: DC
  Administered 2024-10-21 – 2024-10-22 (×2): 34 g via ORAL
  Filled 2024-10-21 (×2): qty 2

## 2024-10-21 MED ORDER — SPIRONOLACTONE 12.5 MG HALF TABLET
12.5000 mg | ORAL_TABLET | Freq: Every day | ORAL | Status: DC
  Administered 2024-10-21 – 2024-10-25 (×5): 12.5 mg via ORAL
  Filled 2024-10-21 (×5): qty 1

## 2024-10-21 MED ORDER — POTASSIUM CHLORIDE 20 MEQ PO PACK
40.0000 meq | PACK | Freq: Two times a day (BID) | ORAL | Status: AC
  Administered 2024-10-21 (×2): 40 meq via ORAL
  Filled 2024-10-21 (×2): qty 2

## 2024-10-21 NOTE — TOC Progression Note (Signed)
 Transition of Care [facility]) - Progression Note    Patient Details  Name: [name] MRN: [id number] Date of Birth: August 10, 1941  [facility]) CM/SW Contact  Lauraine JAYSON Carpen, LCSW Phone Number: 10/21/2024, 11:39 AM  Clinical Narrative:   Insurance denied authorization for SNF. [facility] admissions coordinator is aware. HTA did give the option to appeal. CSW called husband to notify and he would like to appeal. CSW left the paperwork in the room for him or son to pick up when they come back to the hospital. Told husband that the phone number to call is highlighted and on a page towards the back of the packet.  Expected Discharge Plan and Services                                               Social Drivers of Health (SDOH) Interventions SDOH Screenings   Food Insecurity: Food Insecurity Present (10/14/2024)  Housing: Low Risk (10/14/2024)  Transportation Needs: No Transportation Needs (10/14/2024)  Utilities: Not At Risk (10/14/2024)  Alcohol  Screen: Low Risk (01/01/2024)  Depression (PHQ2-9): Low Risk (01/01/2024)  Financial Resource Strain: Low Risk (04/12/2024)  Physical Activity: Inactive (01/01/2024)  Social Connections: Moderately Isolated (10/14/2024)  Stress: No Stress Concern Present (04/12/2024)  Tobacco Use: High Risk (04/15/2024)  Health Literacy: Adequate Health Literacy (05/26/2023)    Readmission Risk Interventions     No data to display

## 2024-10-21 NOTE — Progress Notes (Signed)
 " Progress Note   Patient: [name] FMW:[id number] DOB: 07-19-1941 DOA: 10/13/2024     8 DOS: the patient was seen and examined on 10/21/2024   Brief hospital course:  [name] is a 84 y.o. Caucasian female with medical history significant for essential hypertension, dyslipidemia, hypothyroidism, osteoarthritis and anxiety, who presented to the ER with acute onset of worsening dyspnea with associated chest pain felt this palpitations over the last couple of days  Upon arriving to hospital, she was found to have atrial fibrillation with RVR at 133, chest x-ray showed cardiomegaly with vascular congestion, proBNP 4836. Patient was placed on beta-blocker, diltiazem  drip, and furosemide .  Cardiology consult obtained.  Cardioversion was performed on 1/27.   01/27 -status post cardioversion, received 150 J synchronized biphasic and converted to normal sinus rhythm.  Started on Toprol  XL 50 mg 01/28 -remains in normal sinus rhythm 01/29 -overnight found to be in rapid A-fib and started on amiodarone  drip.  Per cardiology if patient remains in A-fib with RVR will consider repeat cardioversion on 10/24/24   Assessment and Plan:  Chronic atrial fibrillation with RVR (HCC) Status post cardioversion 1/27 Appreciate cardiology input Patient back in rapid A-fib and was started on an amiodarone  bolus and currently on a maintenance infusion.  Appreciate cardiology input Continue metoprolol  for rate control Continue apixaban  as primary prophylaxis for an acute stroke If doesn't chemically cardiovert cardiology considering repeat electric cardioversion     Acute on chronic systolic CHF (congestive heart failure) (HCC) Elevated troponin secondary to congestive heart failure. Hypertensive urgency. Secondary to rapid A-fib Last 2D echocardiogram from 01/26 showed an LVEF of 35 - 40 % with global hypokinesis Continue Lasix , Jardiance , spiro, metoprolol , per cardiology     Hospital  delirium on baseline dementia Intermittent delirium, none today Continue Seroquel  Continue as needed haloperidol      Hypokalemia. Secondary to diuretic therapy replete     Mild thrombocytopenia. Stable.     Hypothyroidism Tsh wnl Continue Synthroid       Essential hypertension BPs soft Continue Toprol -XL.     Anxiety and depression Continue Celexa  and trazodone .     Physical deconditioning PT advising snf, insurer has declined, family wants to appeal  Constipation Last bm 1/27 - start miralax         Subjective: Sitting up in chair, denies dyspnea, no pain  Physical Exam: Vitals:   10/21/24 0102 10/21/24 0300 10/21/24 0500 10/21/24 0800  BP: (!) 129/90 113/85    Pulse: (!) 115 (!) 105    Resp: 18 20    Temp: 98.8 F (37.1 C) 98.7 F (37.1 C)  98 F (36.7 C)  TempSrc: Axillary Axillary  Oral  SpO2: 97% 95%    Weight:   66.4 kg   Height:       General exam: Appears calm and comfortable  Respiratory system: normal wob, basilar rales Cardiovascular system: S1 & S2 heard, irregularly irregular. tachy Gastrointestinal system: Abdomen is nondistended, soft and nontender.   Central nervous system: Alert and oriented to person, moving all 4 Extremities: Symmetric 5 x 5 power. Skin: No rashes, lesions or ulcers Psychiatry:Mood & affect appropriate.      Data Reviewed:  There are no new results to review at this time.  Family Communication: son ricky telephonically 1/30   Disposition: Status is: Inpatient Remains inpatient appropriate because: On IV amiodarone   Planned Discharge Destination: Skilled nursing facility?      Author: Devaughn KATHEE Ban, MD 10/21/2024 11:39 AM  For on  call review [email or web address].  "

## 2024-10-21 NOTE — Plan of Care (Signed)
" °  Problem: Education: Goal: Knowledge of General Education information will improve Description: Including pain rating scale, medication(s)/side effects and non-pharmacologic comfort measures Outcome: Progressing   Problem: Health Behavior/Discharge Planning: Goal: Ability to manage health-related needs will improve Outcome: Progressing   Problem: Clinical Measurements: Goal: Ability to maintain clinical measurements within normal limits will improve Outcome: Progressing Goal: Will remain free from infection Outcome: Progressing Goal: Diagnostic test results will improve Outcome: Progressing Goal: Respiratory complications will improve Outcome: Progressing Goal: Cardiovascular complication will be avoided Outcome: Progressing   Problem: Activity: Goal: Risk for activity intolerance will decrease Outcome: Progressing   Problem: Nutrition: Goal: Adequate nutrition will be maintained Outcome: Progressing   Problem: Coping: Goal: Level of anxiety will decrease Outcome: Progressing   Problem: Elimination: Goal: Will not experience complications related to bowel motility Outcome: Progressing Goal: Will not experience complications related to urinary retention Outcome: Progressing   Problem: Pain Managment: Goal: General experience of comfort will improve and/or be controlled Outcome: Progressing   Problem: Safety: Goal: Ability to remain free from injury will improve Outcome: Progressing   Problem: Skin Integrity: Goal: Risk for impaired skin integrity will decrease Outcome: Progressing   Problem: Education: Goal: Individualized Educational Video(s) Outcome: Progressing   Problem: Activity: Goal: Capacity to carry out activities will improve Outcome: Progressing   Problem: Cardiac: Goal: Ability to achieve and maintain adequate cardiopulmonary perfusion will improve Outcome: Progressing   "

## 2024-10-22 DIAGNOSIS — I5020 Unspecified systolic (congestive) heart failure: Secondary | ICD-10-CM | POA: Diagnosis not present

## 2024-10-22 DIAGNOSIS — F03B0 Unspecified dementia, moderate, without behavioral disturbance, psychotic disturbance, mood disturbance, and anxiety: Secondary | ICD-10-CM | POA: Diagnosis not present

## 2024-10-22 DIAGNOSIS — I4891 Unspecified atrial fibrillation: Secondary | ICD-10-CM | POA: Diagnosis not present

## 2024-10-22 DIAGNOSIS — I4819 Other persistent atrial fibrillation: Secondary | ICD-10-CM | POA: Diagnosis not present

## 2024-10-22 LAB — BASIC METABOLIC PANEL WITH GFR
Anion gap: 12 (ref 5–15)
BUN: 17 mg/dL (ref 8–23)
CO2: 29 mmol/L (ref 22–32)
Calcium: 9.6 mg/dL (ref 8.9–10.3)
Chloride: 98 mmol/L (ref 98–111)
Creatinine, Ser: 0.73 mg/dL (ref 0.44–1.00)
GFR, Estimated: >60 mL/min
Glucose, Bld: 113 mg/dL — ABNORMAL HIGH (ref 70–99)
Potassium: 3.9 mmol/L (ref 3.5–5.1)
Sodium: 138 mmol/L (ref 135–145)

## 2024-10-22 MED ORDER — POLYETHYLENE GLYCOL 3350 17 G PO PACK
17.0000 g | PACK | Freq: Every day | ORAL | Status: DC
  Administered 2024-10-25: 17 g via ORAL
  Filled 2024-10-22 (×2): qty 1

## 2024-10-22 NOTE — Progress Notes (Signed)
 "   Interval history:  No notable events overnight.  Remains in rate controlled atrial fibrillation.  Past Medical History: Past Medical History:  Diagnosis Date   Allergy    Anemia    Anxiety    Arthritis    Bicuspid aortic valve    Cancer (HCC)    skin cancer   Hyperlipidemia    Hypertension    Hypothyroidism    PAF (paroxysmal atrial fibrillation) (HCC)    Sciatica    Sleep apnea    does not use cpap   Thrombocytopenia     Past Surgical History: Past Surgical History:  Procedure Laterality Date   ABDOMINAL HYSTERECTOMY     APPENDECTOMY     BACK SURGERY     lower   BILATERAL CARPAL TUNNEL RELEASE     BREAST CYST ASPIRATION Left    neg   CARDIOVERSION N/A 10/08/2023   Procedure: CARDIOVERSION;  Surgeon: Perla Evalene PARAS, MD;  Location: [facility];  Service: Cardiovascular;  Laterality: N/A;   CARDIOVERSION N/A 10/18/2024   Procedure: CARDIOVERSION;  Surgeon: Perla Evalene PARAS, MD;  Location: [facility];  Service: Cardiovascular;  Laterality: N/A;   COLONOSCOPY     COLONOSCOPY WITH PROPOFOL  N/A 04/23/2021   Procedure: COLONOSCOPY WITH PROPOFOL ;  Surgeon: Janalyn Keene NOVAK, MD;  Location: [facility];  Service: Endoscopy;  Laterality: N/A;   ENTROPIAN REPAIR Left 06/08/2020   Procedure: ENTROPION REPAIR, SUTURES ENTROPION REPAIR, EXTENSIVE LEFT;  Surgeon: Ashley Greig HERO, MD;  Location: [facility];  Service: Ophthalmology;  Laterality: Left;   EYE SURGERY     FRACTURE SURGERY     HERNIA REPAIR     INTRAMEDULLARY (IM) NAIL INTERTROCHANTERIC Left 10/04/2023   Procedure: INTRAMEDULLARY (IM) NAIL INTERTROCHANTERIC;  Surgeon: Edie Norleen PARAS, MD;  Location: [facility];  Service: Orthopedics;  Laterality: Left;   JOINT REPLACEMENT     knees   KNEE SURGERY Bilateral    KNEE SURGERY     OPEN REDUCTION INTERNAL FIXATION (ORIF) DISTAL RADIAL FRACTURE Left 06/23/2019   Procedure: OPEN REDUCTION INTERNAL FIXATION (ORIF) DISTAL RADIAL FRACTURE;  Surgeon: Kathlynn Sharper,  MD;  Location: [facility];  Service: Orthopedics;  Laterality: Left;   PARATHYROIDECTOMY  04/27/2020   salivary stone remove     SPINAL CORD STIMULATOR IMPLANT Right    SPINAL CORD STIMULATOR REMOVAL N/A 07/28/2022   Procedure: REMOVAL OF SPINAL CORD STIMULATOR;  Surgeon: Clois Fret, MD;  Location: [facility];  Service: Neurosurgery;  Laterality: N/A;   TEE WITHOUT CARDIOVERSION N/A 10/18/2024   Procedure: ECHOCARDIOGRAM, TRANSESOPHAGEAL;  Surgeon: Perla Evalene PARAS, MD;  Location: [facility];  Service: Cardiovascular;  Laterality: N/A;   THYROIDECTOMY Right 04/27/2020   Procedure: PARATHYROIDECTOMY;  Surgeon: Gladis Cough, MD;  Location: [facility];  Service: General;  Laterality: Right;   TOTAL KNEE REVISION Right 08/13/2015   Procedure: TOTAL KNEE REVISION;  Surgeon: Cough Car, MD;  Location: [facility];  Service: Orthopedics;  Laterality: Right;     Allergies:    Allergies[1]  Social History:   Social History   Socioeconomic History   Marital status: Married    Spouse name: ROBERT   Number of children: 4   Years of education: Not on file   Highest education level: GED or equivalent  Occupational History   Occupation: retired   Occupation: Retired  Tobacco Use   Smoking status: Every Day    Current packs/day: 0.50    Average packs/day: 0.5 packs/day for 40.0 years (20.0 ttl pk-yrs)  Types: Cigarettes   Smokeless tobacco: Never  Vaping Use   Vaping status: Never Used  Substance and Sexual Activity   Alcohol  use: No   Drug use: No   Sexual activity: Not Currently  Other Topics Concern   Not on file  Social History Narrative   Not on file   Social Drivers of Health   Tobacco Use: High Risk (04/15/2024)   Patient History    Smoking Tobacco Use: Every Day    Smokeless Tobacco Use: Never    Passive Exposure: Not on file  Financial Resource Strain: Low Risk (04/12/2024)   Overall Financial Resource Strain (CARDIA)    Difficulty of Paying Living Expenses: Not very  hard  Food Insecurity: Food Insecurity Present (10/14/2024)   Epic    Worried About Programme Researcher, Broadcasting/film/video in the Last Year: Sometimes true    Ran Out of Food in the Last Year: Never true  Transportation Needs: No Transportation Needs (10/14/2024)   Epic    Lack of Transportation (Medical): No    Lack of Transportation (Non-Medical): No  Physical Activity: Inactive (01/01/2024)   Exercise Vital Sign    Days of Exercise per Week: 0 days    Minutes of Exercise per Session: 30 min  Stress: No Stress Concern Present (04/12/2024)   [employer] of Occupational Health - Occupational Stress Questionnaire    Feeling of Stress: Only a little  Social Connections: Moderately Isolated (10/14/2024)   Social Connection and Isolation Panel    Frequency of Communication with Friends and Family: Never    Frequency of Social Gatherings with Friends and Family: Never    Attends Religious Services: Never    Database Administrator or Organizations: Yes    Attends Engineer, Structural: More than 4 times per year    Marital Status: Married  Catering Manager Violence: Not At Risk (10/14/2024)   Epic    Fear of Current or Ex-Partner: No    Emotionally Abused: No    Physically Abused: No    Sexually Abused: No  Depression (PHQ2-9): Low Risk (01/01/2024)   Depression (PHQ2-9)    PHQ-2 Score: 0  Alcohol  Screen: Low Risk (01/01/2024)   Alcohol  Screen    Last Alcohol  Screening Score (AUDIT): 0  Housing: Low Risk (10/14/2024)   Epic    Unable to Pay for Housing in the Last Year: No    Number of Times Moved in the Last Year: 0    Homeless in the Last Year: No  Utilities: Not At Risk (10/14/2024)   Epic    Threatened with loss of utilities: No  Health Literacy: Adequate Health Literacy (05/26/2023)   B1300 Health Literacy    Frequency of need for help with medical instructions: Rarely     Family History:    Family History  Problem Relation Age of Onset   Dementia Mother    Dementia Sister     Heart attack Brother    Diabetes Brother    Breast cancer Paternal Aunt    Heart attack Son    Hypercalcemia Neg Hx    Cancer Neg Hx      ROS:  All other ROS reviewed and negative. Pertinent positives noted in the HPI.     Physical Exam/Data:   Vitals:   10/21/24 2343 10/22/24 0100 10/22/24 0428 10/22/24 0449  BP: 128/86 112/74 118/80   Pulse:  (!) 101    Resp:  18    Temp:  97.6 F (36.4 C) 97.8  F (36.6 C)   TempSrc:  Oral Oral   SpO2:  96%    Weight:    69 kg  Height:        Intake/Output Summary (Last 24 hours) at 10/22/2024 0932 Last data filed at 10/22/2024 0600 Gross per 24 hour  Intake 864.57 ml  Output 400 ml  Net 464.57 ml       10/22/2024    4:49 AM 10/21/2024    5:00 AM 10/20/2024    5:00 AM  Last 3 Weights  Weight (lbs) 152 lb 1.9 oz 146 lb 6.2 oz 153 lb 3.5 oz  Weight (kg) 69 kg 66.4 kg 69.5 kg    Body mass index is 26.95 kg/m.  General: no acute distress  Neck: no JVD Cardiac: Normal S1, S2; irregular rate and rhythm; no murmurs, rubs, or gallops Lungs: Clear to auscultation bilaterally, no wheezing, rhonchi or rales  Ext: No edema, pulses 2+ Skin: Warm and dry Neuro: Alert and oriented to person, place, time, and situation Psych: Normal mood and affect    EKG:  The EKG was personally reviewed and demonstrates: No new tracings Telemetry:  Telemetry was personally reviewed and demonstrates: Rate controlled atrial fibrillation   Relevant CV Studies: -TEE guided DCCV 10/18/2024; LVEF 35 to 40%, normal RV size and function, mild MR - TTE 09/2024 LVEF 40 to 45%, normal RV size and function - TTE 09/2023 LVEF 50 to 55%, normal RV size and function, mild MR   Assessment and Plan:  Persistent atrial fibrillation Status post DCCV 10/18/2024.  Had recurrence of A-fib shortly after.  Started on amiodarone .  Rate controlled.   Plan: - Continue Eliquis  5 mg twice daily - Continue amiodarone  gtt. for now - Continue metoprolol  XL 50 mg daily; goal heart rate  less than 110 - Will plan for repeat DCCV early next week without TEE following amiodarone  load.   Heart failure with reduced ejection fraction Possibly tachycardia mediated.  EF was normal 09/2023.  GDMT somewhat limited by hypotension.   Plan: - DCCV as above - Continue metoprolol  XL 50 mg daily - Continue Jardiance  10 mg daily - Continue spironolactone  12.5 mg daily - Continue p.o. Lasix  80 mg daily - BP limits additional GDMT titration for now; can consider adding low-dose ARB prior to discharge   History of bicuspid aortic valve Dementia No evidence of aortopathy or stenosis.  Interventional options would be limited anyhow given dementia.  Can continue to follow on serial echo.   Signed, Caron Poser, MD  [place]  [facility]  10/22/2024 9:32 AM     [1]  Allergies Allergen Reactions   Codeine Nausea Only    Tolerates oxycodone    Morphine  Nausea And Vomiting   Ace Inhibitors Cough   Adhesive [Tape] Other (See Comments) and Rash    whelps   "

## 2024-10-22 NOTE — Progress Notes (Addendum)
" °  Progress Note   Patient: [name] FMW:[id number] DOB: 1941/05/04 DOA: 10/13/2024     9 DOS: the patient was seen and examined on 10/22/2024   Brief hospital course:  [name] is a 84 y.o. Caucasian female with medical history significant for essential hypertension, dyslipidemia, hypothyroidism, osteoarthritis and anxiety, who presented to the ER with acute onset of worsening dyspnea with associated chest pain felt this palpitations over the last couple of days  Upon arriving to hospital, she was found to have atrial fibrillation with RVR at 133, chest x-ray showed cardiomegaly with vascular congestion, proBNP 4836. Patient was placed on beta-blocker, diltiazem  drip, and furosemide .  Cardiology consult obtained.  Cardioversion was performed on 1/27.   01/27 -status post cardioversion, received 150 J synchronized biphasic and converted to normal sinus rhythm.  Started on Toprol  XL 50 mg 01/28 -remains in normal sinus rhythm 01/29 -overnight found to be in rapid A-fib and started on amiodarone  drip.  Per cardiology if patient remains in A-fib with RVR will consider repeat cardioversion on 10/24/24   Assessment and Plan:  Chronic atrial fibrillation with RVR (HCC) Status post cardioversion 1/27 Appreciate cardiology input Patient back in rapid A-fib and was started on an amiodarone  bolus and currently on a maintenance infusion.  Appreciate cardiology input Continue metoprolol  for rate control Continue apixaban  as primary prophylaxis for an acute stroke If doesn't chemically cardiovert cardiology considering repeat electric cardioversion this coming week   Acute on chronic systolic CHF (congestive heart failure) (HCC) Elevated troponin secondary to congestive heart failure. Hypertensive urgency. Secondary to rapid A-fib Last 2D echocardiogram from 01/26 showed an LVEF of 35 - 40 % with global hypokinesis Continue Lasix , Jardiance , spiro, metoprolol , per cardiology    Hospital delirium on baseline dementia Intermittent delirium, none today Continue Seroquel  Continue as needed haloperidol    Hypokalemia. Resolved - monitor   Mild thrombocytopenia. Stable.   Hypothyroidism Tsh wnl Continue Synthroid     Essential hypertension BPs soft Continue Toprol -XL.   Anxiety and depression Continue Celexa  and trazodone .   Physical deconditioning PT advising snf, insurer has declined, family wants to appeal  Constipation Bm today - reduce miralax  dose     Subjective: Sitting in bed, no complaints, BM just a little while ago  Physical Exam: Vitals:   10/21/24 2343 10/22/24 0100 10/22/24 0428 10/22/24 0449  BP: 128/86 112/74 118/80   Pulse:  (!) 101    Resp:  18    Temp:  97.6 F (36.4 C) 97.8 F (36.6 C)   TempSrc:  Oral Oral   SpO2:  96%    Weight:    69 kg  Height:       General exam: Appears calm and comfortable  Respiratory system: normal wob, basilar rales Cardiovascular system: S1 & S2 heard, irregularly irregular. tachy Gastrointestinal system: Abdomen is nondistended, soft and nontender.   Central nervous system: Alert and oriented to person, moving all 4 Extremities: Symmetric 5 x 5 power. Skin: No rashes, lesions or ulcers Psychiatry:Mood & affect appropriate.      Data Reviewed:  There are no new results to review at this time.  Family Communication: son ricky telephonically 1/31  Disposition: Status is: Inpatient Remains inpatient appropriate because: On IV amiodarone , possible dccv this week  Planned Discharge Destination: Skilled nursing facility?      Author: Devaughn KATHEE Ban, MD 10/22/2024 12:31 PM  For on call review [email or web address].  "

## 2024-10-22 NOTE — Plan of Care (Signed)
" °  Problem: Education: Goal: Knowledge of General Education information will improve Description: Including pain rating scale, medication(s)/side effects and non-pharmacologic comfort measures Outcome: Progressing   Problem: Health Behavior/Discharge Planning: Goal: Ability to manage health-related needs will improve Outcome: Progressing   Problem: Clinical Measurements: Goal: Ability to maintain clinical measurements within normal limits will improve Outcome: Progressing Goal: Will remain free from infection Outcome: Progressing Goal: Diagnostic test results will improve Outcome: Progressing Goal: Respiratory complications will improve Outcome: Progressing Goal: Cardiovascular complication will be avoided Outcome: Progressing   Problem: Activity: Goal: Risk for activity intolerance will decrease Outcome: Progressing   Problem: Nutrition: Goal: Adequate nutrition will be maintained Outcome: Progressing   Problem: Coping: Goal: Level of anxiety will decrease Outcome: Progressing   Problem: Elimination: Goal: Will not experience complications related to bowel motility Outcome: Progressing Goal: Will not experience complications related to urinary retention Outcome: Progressing   Problem: Pain Managment: Goal: General experience of comfort will improve and/or be controlled Outcome: Progressing   Problem: Safety: Goal: Ability to remain free from injury will improve Outcome: Progressing   Problem: Skin Integrity: Goal: Risk for impaired skin integrity will decrease Outcome: Progressing   Problem: Education: Goal: Individualized Educational Video(s) Outcome: Progressing   Problem: Activity: Goal: Capacity to carry out activities will improve Outcome: Progressing   Problem: Cardiac: Goal: Ability to achieve and maintain adequate cardiopulmonary perfusion will improve Outcome: Progressing   "

## 2024-10-22 NOTE — TOC Progression Note (Addendum)
 Transition of Care [facility]) - Progression Note    Patient Details  Name: [name] MRN: [id number] Date of Birth: 1941-06-29  [facility] CM/SW Contact  Racheal LITTIE Schimke, RN Phone Number: 10/22/2024, 12:30 PM  Clinical Narrative: CMRN called husband to follow up on the SNF Appeal, he says that he has the flu and unable to follow up. He says if he feels better on Monday, he will come to the hospital to take her home. 12:55 pm CMRN spoke with patient's  Son, Dick about Appeal process, he will call [facility], [phone]. Son to call CM with call report.                      Expected Discharge Plan and Services                                               Social Drivers of Health (SDOH) Interventions SDOH Screenings   Food Insecurity: Food Insecurity Present (10/14/2024)  Housing: Low Risk (10/14/2024)  Transportation Needs: No Transportation Needs (10/14/2024)  Utilities: Not At Risk (10/14/2024)  Alcohol  Screen: Low Risk (01/01/2024)  Depression (PHQ2-9): Low Risk (01/01/2024)  Financial Resource Strain: Low Risk (04/12/2024)  Physical Activity: Inactive (01/01/2024)  Social Connections: Moderately Isolated (10/14/2024)  Stress: No Stress Concern Present (04/12/2024)  Tobacco Use: High Risk (04/15/2024)  Health Literacy: Adequate Health Literacy (05/26/2023)    Readmission Risk Interventions     No data to display

## 2024-10-22 NOTE — Plan of Care (Signed)
" °  Problem: Clinical Measurements: Goal: Respiratory complications will improve Outcome: Progressing   Problem: Clinical Measurements: Goal: Cardiovascular complication will be avoided Outcome: Progressing   Problem: Activity: Goal: Risk for activity intolerance will decrease Outcome: Progressing   Problem: Nutrition: Goal: Adequate nutrition will be maintained Outcome: Progressing   Problem: Pain Managment: Goal: General experience of comfort will improve and/or be controlled Outcome: Progressing   Problem: Safety: Goal: Ability to remain free from injury will improve Outcome: Progressing   "

## 2024-10-23 DIAGNOSIS — I4891 Unspecified atrial fibrillation: Secondary | ICD-10-CM | POA: Diagnosis not present

## 2024-10-23 LAB — BASIC METABOLIC PANEL WITH GFR
Anion gap: 12 (ref 5–15)
BUN: 21 mg/dL (ref 8–23)
CO2: 27 mmol/L (ref 22–32)
Calcium: 9.4 mg/dL (ref 8.9–10.3)
Chloride: 96 mmol/L — ABNORMAL LOW (ref 98–111)
Creatinine, Ser: 0.86 mg/dL (ref 0.44–1.00)
GFR, Estimated: >60 mL/min
Glucose, Bld: 114 mg/dL — ABNORMAL HIGH (ref 70–99)
Potassium: 3.6 mmol/L (ref 3.5–5.1)
Sodium: 135 mmol/L (ref 135–145)

## 2024-10-23 MED ORDER — SODIUM CHLORIDE 0.9 % IV SOLN: INTRAVENOUS | Status: DC

## 2024-10-23 MED ORDER — POTASSIUM CHLORIDE CRYS ER 20 MEQ PO TBCR
40.0000 meq | EXTENDED_RELEASE_TABLET | Freq: Once | ORAL | Status: AC
  Administered 2024-10-23: 40 meq via ORAL
  Filled 2024-10-23: qty 2

## 2024-10-23 NOTE — Progress Notes (Signed)
 "   Interval history:  No notable events overnight.  Remains in rate controlled atrial fibrillation.  Discussed repeat cardioversion to which patient and husband are amenable.  Past Medical History: Past Medical History:  Diagnosis Date   Allergy    Anemia    Anxiety    Arthritis    Bicuspid aortic valve    Cancer (HCC)    skin cancer   Hyperlipidemia    Hypertension    Hypothyroidism    PAF (paroxysmal atrial fibrillation) (HCC)    Sciatica    Sleep apnea    does not use cpap   Thrombocytopenia     Past Surgical History: Past Surgical History:  Procedure Laterality Date   ABDOMINAL HYSTERECTOMY     APPENDECTOMY     BACK SURGERY     lower   BILATERAL CARPAL TUNNEL RELEASE     BREAST CYST ASPIRATION Left    neg   CARDIOVERSION N/A 10/08/2023   Procedure: CARDIOVERSION;  Surgeon: Perla Evalene PARAS, MD;  Location: [facility];  Service: Cardiovascular;  Laterality: N/A;   CARDIOVERSION N/A 10/18/2024   Procedure: CARDIOVERSION;  Surgeon: Perla Evalene PARAS, MD;  Location: [facility];  Service: Cardiovascular;  Laterality: N/A;   COLONOSCOPY     COLONOSCOPY WITH PROPOFOL  N/A 04/23/2021   Procedure: COLONOSCOPY WITH PROPOFOL ;  Surgeon: Janalyn Keene NOVAK, MD;  Location: [facility];  Service: Endoscopy;  Laterality: N/A;   ENTROPIAN REPAIR Left 06/08/2020   Procedure: ENTROPION REPAIR, SUTURES ENTROPION REPAIR, EXTENSIVE LEFT;  Surgeon: Ashley Greig HERO, MD;  Location: [facility];  Service: Ophthalmology;  Laterality: Left;   EYE SURGERY     FRACTURE SURGERY     HERNIA REPAIR     INTRAMEDULLARY (IM) NAIL INTERTROCHANTERIC Left 10/04/2023   Procedure: INTRAMEDULLARY (IM) NAIL INTERTROCHANTERIC;  Surgeon: Edie Norleen PARAS, MD;  Location: [facility];  Service: Orthopedics;  Laterality: Left;   JOINT REPLACEMENT     knees   KNEE SURGERY Bilateral    KNEE SURGERY     OPEN REDUCTION INTERNAL FIXATION (ORIF) DISTAL RADIAL FRACTURE Left 06/23/2019   Procedure: OPEN REDUCTION  INTERNAL FIXATION (ORIF) DISTAL RADIAL FRACTURE;  Surgeon: Kathlynn Sharper, MD;  Location: [facility];  Service: Orthopedics;  Laterality: Left;   PARATHYROIDECTOMY  04/27/2020   salivary stone remove     SPINAL CORD STIMULATOR IMPLANT Right    SPINAL CORD STIMULATOR REMOVAL N/A 07/28/2022   Procedure: REMOVAL OF SPINAL CORD STIMULATOR;  Surgeon: Clois Fret, MD;  Location: [facility];  Service: Neurosurgery;  Laterality: N/A;   TEE WITHOUT CARDIOVERSION N/A 10/18/2024   Procedure: ECHOCARDIOGRAM, TRANSESOPHAGEAL;  Surgeon: Perla Evalene PARAS, MD;  Location: [facility];  Service: Cardiovascular;  Laterality: N/A;   THYROIDECTOMY Right 04/27/2020   Procedure: PARATHYROIDECTOMY;  Surgeon: Gladis Cough, MD;  Location: [facility];  Service: General;  Laterality: Right;   TOTAL KNEE REVISION Right 08/13/2015   Procedure: TOTAL KNEE REVISION;  Surgeon: Cough Car, MD;  Location: [facility];  Service: Orthopedics;  Laterality: Right;     Allergies:    Allergies[1]  Social History:   Social History   Socioeconomic History   Marital status: Married    Spouse name: ROBERT   Number of children: 4   Years of education: Not on file   Highest education level: GED or equivalent  Occupational History   Occupation: retired   Occupation: Retired  Tobacco Use   Smoking status: Every Day    Current packs/day: 0.50    Average packs/day:  0.5 packs/day for 40.0 years (20.0 ttl pk-yrs)    Types: Cigarettes   Smokeless tobacco: Never  Vaping Use   Vaping status: Never Used  Substance and Sexual Activity   Alcohol  use: No   Drug use: No   Sexual activity: Not Currently  Other Topics Concern   Not on file  Social History Narrative   Not on file   Social Drivers of Health   Tobacco Use: High Risk (04/15/2024)   Patient History    Smoking Tobacco Use: Every Day    Smokeless Tobacco Use: Never    Passive Exposure: Not on file  Financial Resource Strain: Low Risk (04/12/2024)   Overall Financial  Resource Strain (CARDIA)    Difficulty of Paying Living Expenses: Not very hard  Food Insecurity: Food Insecurity Present (10/14/2024)   Epic    Worried About Programme Researcher, Broadcasting/film/video in the Last Year: Sometimes true    Ran Out of Food in the Last Year: Never true  Transportation Needs: No Transportation Needs (10/14/2024)   Epic    Lack of Transportation (Medical): No    Lack of Transportation (Non-Medical): No  Physical Activity: Inactive (01/01/2024)   Exercise Vital Sign    Days of Exercise per Week: 0 days    Minutes of Exercise per Session: 30 min  Stress: No Stress Concern Present (04/12/2024)   [employer] of Occupational Health - Occupational Stress Questionnaire    Feeling of Stress: Only a little  Social Connections: Moderately Isolated (10/14/2024)   Social Connection and Isolation Panel    Frequency of Communication with Friends and Family: Never    Frequency of Social Gatherings with Friends and Family: Never    Attends Religious Services: Never    Database Administrator or Organizations: Yes    Attends Engineer, Structural: More than 4 times per year    Marital Status: Married  Catering Manager Violence: Not At Risk (10/14/2024)   Epic    Fear of Current or Ex-Partner: No    Emotionally Abused: No    Physically Abused: No    Sexually Abused: No  Depression (PHQ2-9): Low Risk (01/01/2024)   Depression (PHQ2-9)    PHQ-2 Score: 0  Alcohol  Screen: Low Risk (01/01/2024)   Alcohol  Screen    Last Alcohol  Screening Score (AUDIT): 0  Housing: Low Risk (10/14/2024)   Epic    Unable to Pay for Housing in the Last Year: No    Number of Times Moved in the Last Year: 0    Homeless in the Last Year: No  Utilities: Not At Risk (10/14/2024)   Epic    Threatened with loss of utilities: No  Health Literacy: Adequate Health Literacy (05/26/2023)   B1300 Health Literacy    Frequency of need for help with medical instructions: Rarely     Family History:    Family History   Problem Relation Age of Onset   Dementia Mother    Dementia Sister    Heart attack Brother    Diabetes Brother    Breast cancer Paternal Aunt    Heart attack Son    Hypercalcemia Neg Hx    Cancer Neg Hx      ROS:  All other ROS reviewed and negative. Pertinent positives noted in the HPI.     Physical Exam/Data:   Vitals:   10/23/24 0445 10/23/24 0551 10/23/24 0725 10/23/24 0726  BP:  (!) 94/56 90/63   Pulse:  74 94 87  Resp:  17 18 11   Temp:  (!) 97.5 F (36.4 C) (!) 97.4 F (36.3 C)   TempSrc:  Oral Axillary   SpO2:  100% 98% 98%  Weight: 69.3 kg     Height:        Intake/Output Summary (Last 24 hours) at 10/23/2024 0825 Last data filed at 10/23/2024 0400 Gross per 24 hour  Intake 1678.17 ml  Output 900 ml  Net 778.17 ml       10/23/2024    4:45 AM 10/22/2024    4:49 AM 10/21/2024    5:00 AM  Last 3 Weights  Weight (lbs) 152 lb 12.5 oz 152 lb 1.9 oz 146 lb 6.2 oz  Weight (kg) 69.3 kg 69 kg 66.4 kg    Body mass index is 27.06 kg/m.  General: no acute distress  Neck: no JVD Cardiac: Normal S1, S2; irregular rate and rhythm; no murmurs, rubs, or gallops Lungs: Clear to auscultation bilaterally, no wheezing, rhonchi or rales  Ext: No edema, pulses 2+ Skin: Warm and dry Neuro: Alert and oriented to person, place, time, and situation Psych: Normal mood and affect    EKG:  The EKG was personally reviewed and demonstrates: No new tracings Telemetry:  Telemetry was personally reviewed and demonstrates: Rate controlled atrial fibrillation   Relevant CV Studies: -TEE guided DCCV 10/18/2024; LVEF 35 to 40%, normal RV size and function, mild MR - TTE 09/2024 LVEF 40 to 45%, normal RV size and function - TTE 09/2023 LVEF 50 to 55%, normal RV size and function, mild MR   Assessment and Plan:  Persistent atrial fibrillation Status post DCCV 10/18/2024.  Had recurrence of A-fib shortly after.  Started on amiodarone .  Rate controlled.   Plan: - Continue Eliquis  5 mg twice  daily - Continue amiodarone  gtt. for now - Continue metoprolol  XL 50 mg daily; goal heart rate less than 110 - Will plan for repeat DCCV early next week without TEE now that she has been adequately loaded with amiodarone ; plan to transition to oral afterwards  Informed Consent   The risks (stroke, cardiac arrhythmias rarely resulting in the need for a temporary or permanent pacemaker, skin irritation or burns and complications associated with conscious sedation including aspiration, arrhythmia, respiratory failure and death), benefits (restoration of normal sinus rhythm) and alternatives of a direct current cardioversion were explained in detail to Ms. Sandquist and her husband and she agrees to proceed.          Heart failure with reduced ejection fraction Possibly tachycardia mediated.  EF was normal 09/2023.  GDMT somewhat limited by hypotension.   Plan: - DCCV as above - Continue metoprolol  XL 50 mg daily - Continue Jardiance  10 mg daily - Continue spironolactone  12.5 mg daily - Continue p.o. Lasix  80 mg daily - BP limits additional GDMT titration for now; can consider adding low-dose ARB prior to discharge   History of bicuspid aortic valve Dementia No evidence of aortopathy or stenosis.  Interventional options would be limited anyhow given dementia.  Can continue to follow on serial echo.   Signed, Caron Poser, MD  [place]  [facility]  10/23/2024 8:25 AM     [1]  Allergies Allergen Reactions   Codeine Nausea Only    Tolerates oxycodone    Morphine  Nausea And Vomiting   Ace Inhibitors Cough   Adhesive [Tape] Other (See Comments) and Rash    whelps   "

## 2024-10-23 NOTE — Plan of Care (Signed)
" °  Problem: Education: Goal: Knowledge of General Education information will improve Description: Including pain rating scale, medication(s)/side effects and non-pharmacologic comfort measures Outcome: Progressing   Problem: Health Behavior/Discharge Planning: Goal: Ability to manage health-related needs will improve Outcome: Progressing   Problem: Clinical Measurements: Goal: Ability to maintain clinical measurements within normal limits will improve Outcome: Progressing Goal: Will remain free from infection Outcome: Progressing Goal: Diagnostic test results will improve Outcome: Progressing Goal: Respiratory complications will improve Outcome: Progressing Goal: Cardiovascular complication will be avoided Outcome: Progressing   Problem: Activity: Goal: Risk for activity intolerance will decrease Outcome: Progressing   Problem: Nutrition: Goal: Adequate nutrition will be maintained Outcome: Progressing   Problem: Coping: Goal: Level of anxiety will decrease Outcome: Progressing   Problem: Elimination: Goal: Will not experience complications related to bowel motility Outcome: Progressing Goal: Will not experience complications related to urinary retention Outcome: Progressing   Problem: Pain Managment: Goal: General experience of comfort will improve and/or be controlled Outcome: Progressing   Problem: Safety: Goal: Ability to remain free from injury will improve Outcome: Progressing   Problem: Skin Integrity: Goal: Risk for impaired skin integrity will decrease Outcome: Progressing   Problem: Education: Goal: Individualized Educational Video(s) Outcome: Progressing   Problem: Activity: Goal: Capacity to carry out activities will improve Outcome: Progressing   Problem: Cardiac: Goal: Ability to achieve and maintain adequate cardiopulmonary perfusion will improve Outcome: Progressing   "

## 2024-10-24 ENCOUNTER — Encounter: Admission: EM | Disposition: A | Payer: Self-pay | Source: Home / Self Care | Attending: Internal Medicine

## 2024-10-24 ENCOUNTER — Inpatient Hospital Stay: Admitting: Certified Registered"

## 2024-10-24 DIAGNOSIS — I4891 Unspecified atrial fibrillation: Secondary | ICD-10-CM | POA: Diagnosis not present

## 2024-10-24 LAB — BASIC METABOLIC PANEL WITH GFR
Anion gap: 12 (ref 5–15)
BUN: 21 mg/dL (ref 8–23)
CO2: 25 mmol/L (ref 22–32)
Calcium: 9 mg/dL (ref 8.9–10.3)
Chloride: 97 mmol/L — ABNORMAL LOW (ref 98–111)
Creatinine, Ser: 0.88 mg/dL (ref 0.44–1.00)
GFR, Estimated: >60 mL/min
Glucose, Bld: 95 mg/dL (ref 70–99)
Potassium: 3.9 mmol/L (ref 3.5–5.1)
Sodium: 134 mmol/L — ABNORMAL LOW (ref 135–145)

## 2024-10-24 MED ORDER — EPHEDRINE 5 MG/ML INJ
INTRAVENOUS | Status: AC
  Filled 2024-10-24: qty 5

## 2024-10-24 MED ORDER — GLYCOPYRROLATE 0.2 MG/ML IJ SOLN
INTRAMUSCULAR | Status: AC
  Filled 2024-10-24: qty 1

## 2024-10-24 MED ORDER — LIDOCAINE HCL (CARDIAC) PF 50 MG/5ML IV SOSY
PREFILLED_SYRINGE | INTRAVENOUS | Status: DC | PRN
  Administered 2024-10-24: 80 mg via INTRAVENOUS

## 2024-10-24 MED ORDER — PHENYLEPHRINE 80 MCG/ML (10ML) SYRINGE FOR IV PUSH (FOR BLOOD PRESSURE SUPPORT)
PREFILLED_SYRINGE | INTRAVENOUS | Status: AC
  Filled 2024-10-24: qty 10

## 2024-10-24 MED ORDER — PROPOFOL 10 MG/ML IV BOLUS
INTRAVENOUS | Status: AC
  Filled 2024-10-24: qty 20

## 2024-10-24 MED ORDER — PROPOFOL 10 MG/ML IV BOLUS
INTRAVENOUS | Status: DC | PRN
  Administered 2024-10-24: 30 mg via INTRAVENOUS

## 2024-10-24 MED ORDER — AMIODARONE HCL 200 MG PO TABS
200.0000 mg | ORAL_TABLET | Freq: Every day | ORAL | Status: DC
  Administered 2024-10-25: 200 mg via ORAL
  Filled 2024-10-24: qty 1

## 2024-10-24 MED ORDER — LIDOCAINE HCL (PF) 2 % IJ SOLN
INTRAMUSCULAR | Status: AC
  Filled 2024-10-24: qty 5

## 2024-10-24 NOTE — Discharge Instructions (Addendum)
 Food Resources  Agency Name: [facility] Agency Address: [address], [place], [place] [address] Phone: [phone] Website: [email or web address] Service(s) Offered: Housing services, self-sufficiency, congregate meal program, weatherization program, Event organiser program, emergency food assistance,  housing counseling, home ownership program, wheels - to work program.  [employer] free for 60 and older at various locations from [employer], Monday-Friday:  [employer], [address] [place], [phone] -[facility], [address], [place] [phone]  -[facility], [address], [place] [phone]  -[address] [place], [phone]  Agency Name: [facility] on [facility] Address: [phone], [place], [place] [address] Phone: [phone] Website: [email or web address] Service(s) Offered: Home delivered hot, frozen, and emergency  meals. Grocery assistance program which matches  volunteers one-on-one with seniors unable to grocery shop  for themselves. Must be 60 years and older; less than 20  hours of in-home aide service, limited or no driving ability;  live alone or with someone with a disability; live in  [place].  Agency Name: [facility] [employer]) Address: [address] [place], [place] [address] Phone: [phone] Service(s) Offered: Food is served to shut-ins, homeless, elderly, and low income people in the community every Saturday (11:30 am-12:30 pm) and Sunday (12:30 pm-1:30pm). Volunteers also offer help and encouragement in seeking employment,  and spiritual guidance.  Agency Name: [employer] Address: [address] [place], [place] [address] Phone: [phone] Service(s) Offered: Child support services; child welfare services; food stamps; Medicaid; work first family assistance; and aid  with fuel,  rent, food and medicine.  Agency Name: Dietitian Address: [address], [place], [place] Phone: [phone] Website: [email or web address] Services Offered: Monday 10:00am-12:00, 8:00pm-9:00pm, and Friday 10:00am-12:00.  Agency Name: [employer] [facility] [place] Address: [address] [place], [place] [address] Phone: [phone] Website: [email or web address] Service(s) Offered: Serves weekday meals, open from 11:30 am- 1:00 pm., and 6:30-7:30pm, Monday-Wednesday-Friday distributes food 3:30-6pm, Monday-Wednesday-Friday.  Agency Name: [facility] Address: [address], [place], [place] Phone: [phone] Website: [email or web address] Services Offered: Distributes food the 4th Saturday of the month, starting at 8:00 am  Agency Name: [facility] Address: [phone], [place], [place] [address] Phone: [phone] Website: [email or web address] Service(s) Offered: Bread of life, weekly food pantry. Open Wednesdays from 10:00am-noon.  Agency Name: [facility] [employer] Address: [address] [place], [place], [place] Phone: [phone] Services Offered: Distributes food 9am-1pm, Monday-Thursday. Call for details.  Agency Name: [facility] Address: [address], [place], [place] [address] Phone: [phone] Website: [email or web address] Service(s) Offered: Games developer. Call for assistance.  Agency Name: [facility] Address: [address] [place], [place] [address] Phone: [phone] Service Offered: Emergency Food Pantry. Call for appointment.  Agency Name: [facility] Address: [address], [place], [place] [address] Phone: [phone] Website: [email or web address] Services Offered: Games developer. Call for details  Agency Name: [facility] Address: [address] [place], [place] Phone:  [phone] Website: [email or web address] Service(s) Offered: Emergency Food Pantry. Call for details.  Agency Name: Holiday representative Address: [address] [place], [place] [address] Phone: [phone] or [phone] Website: [email or web address] Service(s) Offered: Distribute food 9am-11:30 am, Tuesday-Friday, and 1-3:30pm, Monday-Friday. Food pantry Monday-Friday 1pm-3pm, fresh items, Mon.-Wed.-Fri.  Agency Name: [facility] Address: [address] [place], [place] [address] Phone: [phone] Website: [email or web address] Services Offered: Distribute food Tues and Sats from 9:00am-noon.  Closed 1st Saturday of each month. Call for details  Agency Name: [employer] Address: Fayrene Boatman [facility] [address] [place] [address] Phone: [phone]  Services Offered: Delivers meals every Thursday   Do you feel isolated?  [facility] [employer] [facility] offers a [employer] that anyone can call toll free at [phone]. The friendship line is available 24 hours a day  [employer] is a Program of All-inclusive Care for the Elderly (PACE). Their mission is to promote and sustain the independence of seniors wishing to remain in the community. They provide seniors with comprehensive Boehner-term health, social, medical and dietary care. Their program is a safe alternative to nursing home care. [phone]  [facility] Physical Address [facility] [address] [place], [place] [address] Phone: [phone]. . Online zoom yoga class, connect with others without leaving your home [facility] offers [place] dance cardio sessions for individuals via Zoom. This program provides: - Dance fitness activities Please contact program for more information. Servinganyone in need adults 18+ hiv/aids individuals families Call [phone]  Email [email or web address] [email or web address] to get more info  [employer]  offers an online [employer] to individuals where they can receive help to focus on their best health. Whether you're a [employer] member or not, the neighborhood center offers a... Main Serviceshealth education  exercise & fitness  community support services  recreation  virtual support Other Servicessupport groups Servinganyone in need adults young adults teens seniors individuals families [email or web address] to get more info  Schedule on their website  [facility] offers an array of activities for adults age 8 and over. This program provides:- Fitness and health programs- Tech classes- Activity books Main Serviceshealth education  community support services  exercise & fitness  recreation  more education Servingseniors  Call [phone]    For more resources go online to [email or web address].co.uk and type in you zipcode  [facility]ENTER  POST OPERATIVE INSTRUCTIONS FOR DR. ASHLEY AND DR. BAKER [facility] CLINIC PODIATRY DEPARTMENT   Take your medication as prescribed.  Pain medication should be taken only as needed.  Keep the dressing clean, dry and intact.  Keep your foot elevated above the heart level for the first 48 hours.  Walking to the bathroom and brief periods of walking are acceptable, unless we have instructed you to be non-weight bearing.  Always wear your post-op shoe when walking.  Always use your crutches if you are to be non-weight bearing.  Do not take a shower. Baths are permissible as [place] as the foot is kept out of the water.   Every hour you are awake:  Bend your knee 15 times. Flex foot 15 times Massage calf 15 times  Call [facility] [phone]) if any of the following problems occur: You develop a temperature or fever. The bandage becomes saturated with blood. Medication does not stop your pain. Injury of the foot occurs. Any symptoms of infection including  redness, odor, or red streaks running from wound.

## 2024-10-24 NOTE — Progress Notes (Signed)
" ° ° °  PROCEDURAL EXPEDITER PROGRESS NOTE  Patient Name: [name]  DOB:1941-01-19 Date of Admission: 10/13/2024  Date of Assessment:10/24/24   -------------------------------------------------------------------------------------------------------------------   Brief clinical summary: 84 yr old female with a HX of HTN, hypothyroidism. Osteoarthritis and anxiety.   Pt scheduled for cardioversion on 10/24/24  Orders in place:  Yes   Communication with surgical team if no orders: n/a  Labs, test, and orders reviewed: yes  Requires surgical clearance:  no  What type of clearance: n/a  Clearance received: n/a  Barriers noted:n/a   Intervention provided by [facility] team: n/a  Barrier resolved:  not applicable   -------------------------------------------------------------------------------------------------------------------  [facility] Patient Care Command Expediter, Ronal DELENA Bald Please contact us  directly via secure chat (search for [facility]) or by calling us  at [phone] [facility]).  "

## 2024-10-24 NOTE — Progress Notes (Addendum)
" °  Progress Note   Patient: [name] FMW:[id number] DOB: 03-31-1941 DOA: 10/13/2024     11 DOS: the patient was seen and examined on 10/24/2024   Brief hospital course:  [name] is a 84 y.o. Caucasian female with medical history significant for essential hypertension, dyslipidemia, hypothyroidism, osteoarthritis and anxiety, who presented to the ER with acute onset of worsening dyspnea with associated chest pain felt this palpitations over the last couple of days  Upon arriving to hospital, she was found to have atrial fibrillation with RVR at 133, chest x-ray showed cardiomegaly with vascular congestion, proBNP 4836. Patient was placed on beta-blocker, diltiazem  drip, and furosemide .  Cardiology consult obtained.  Cardioversion was performed on 1/27.   01/27 -status post cardioversion, received 150 J synchronized biphasic and converted to normal sinus rhythm.  Started on Toprol  XL 50 mg 01/28 -remains in normal sinus rhythm 01/29 -overnight found to be in rapid A-fib and started on amiodarone  drip.  Per cardiology if patient remains in A-fib with RVR will consider repeat cardioversion on 10/24/24   Assessment and Plan:  Chronic atrial fibrillation with RVR (HCC) Status post cardioversion 1/27 Appreciate cardiology input Patient back in rapid A-fib and was started on an amiodarone  bolus and currently on a maintenance infusion.   Repeat cardioversion today, successful On amio, cardiology considering transition to oral tomorrow Continue metop and apixaban    Acute on chronic systolic CHF (congestive heart failure) (HCC) Elevated troponin secondary to congestive heart failure. Hypertensive urgency. Secondary to rapid A-fib Last 2D echocardiogram from 01/26 showed an LVEF of 35 - 40 % with global hypokinesis Continue Lasix , Jardiance , spiro, metoprolol , per cardiology.     Hospital delirium on baseline dementia Intermittent delirium, none today Continue Seroquel  Continue  as needed haloperidol    Hypokalemia. Resolved - monitor, attempt to maintain above 4   Mild thrombocytopenia. Stable.   Hypothyroidism Tsh wnl Continue Synthroid     Essential hypertension BPs soft Continue Toprol -XL.   Anxiety and depression Continue Celexa  and trazodone .   Physical deconditioning PT advising snf, insurer has declined, husband has elected to take her home w/ home health declines filing appeal Home health ordered today  Constipation resolved - miralax       Subjective: resting after procedure, breathing comfortably, no complaints  Physical Exam: Vitals:   10/24/24 1315 10/24/24 1316 10/24/24 1330 10/24/24 1357  BP: (!) 91/37 (!) 94/55 (!) 96/57 (!) 101/53  Pulse: 61 61 61   Resp: 17 18 14    Temp:    98.7 F (37.1 C)  TempSrc:    Oral  SpO2: 94% 96% 94%   Weight:      Height:       General exam: Appears calm and comfortable  Respiratory system: normal wob, basilar rales Cardiovascular system: rrr Gastrointestinal system: Abdomen is nondistended, soft and nontender.   Central nervous system: Alert and oriented to person, moving all 4 Extremities: Symmetric 5 x 5 power. Skin: No rashes, lesions or ulcers Psychiatry:Mood & affect appropriate.      Data Reviewed:  There are no new results to review at this time.  Family Communication: husband at bedside 2/2, son telephonically 2/2  Disposition: Status is: Inpatient Remains inpatient appropriate because: monitoring overnight  Planned Discharge Destination: home with home health      Author: Devaughn KATHEE Ban, MD 10/24/2024 2:30 PM  For on call review [email or web address].  "

## 2024-10-24 NOTE — Plan of Care (Signed)
" °  Problem: Education: Goal: Knowledge of General Education information will improve Description: Including pain rating scale, medication(s)/side effects and non-pharmacologic comfort measures Outcome: Progressing   Problem: Health Behavior/Discharge Planning: Goal: Ability to manage health-related needs will improve Outcome: Progressing   Problem: Clinical Measurements: Goal: Ability to maintain clinical measurements within normal limits will improve Outcome: Progressing Goal: Will remain free from infection Outcome: Progressing Goal: Diagnostic test results will improve Outcome: Progressing Goal: Respiratory complications will improve Outcome: Progressing Goal: Cardiovascular complication will be avoided Outcome: Progressing   Problem: Activity: Goal: Risk for activity intolerance will decrease Outcome: Progressing   Problem: Nutrition: Goal: Adequate nutrition will be maintained Outcome: Progressing   Problem: Coping: Goal: Level of anxiety will decrease Outcome: Progressing   Problem: Elimination: Goal: Will not experience complications related to bowel motility Outcome: Progressing Goal: Will not experience complications related to urinary retention Outcome: Progressing   Problem: Pain Managment: Goal: General experience of comfort will improve and/or be controlled Outcome: Progressing   Problem: Safety: Goal: Ability to remain free from injury will improve Outcome: Progressing   Problem: Skin Integrity: Goal: Risk for impaired skin integrity will decrease Outcome: Progressing   Problem: Education: Goal: Individualized Educational Video(s) Outcome: Progressing   Problem: Activity: Goal: Capacity to carry out activities will improve Outcome: Progressing   Problem: Cardiac: Goal: Ability to achieve and maintain adequate cardiopulmonary perfusion will improve Outcome: Progressing   "

## 2024-10-24 NOTE — Procedures (Signed)
 Cardioversion procedure note For atrial flutter.  Procedure Details:  Consent: Risks of procedure as well as the alternatives and risks of each were explained to the (patient/caregiver).  Consent for procedure obtained.  Time Out: Verified patient identification, verified procedure, site/side was marked, verified correct patient position, special equipment/implants available, medications/allergies/relevent history reviewed, required imaging and test results available.  Performed  Patient placed on cardiac monitor, pulse oximetry, supplemental oxygen  as necessary.   Sedation given: propofol  IV, per anesthesia team.  Pacer pads placed anterior and posterior chest.  Cardioverted 1 time(s).   Cardioverted at  150J. Synchronized biphasic Converted to NSR   Evaluation: Findings: Post procedure EKG shows: NSR Complications: None Patient did tolerate procedure well.  Redell Cave, M.D.

## 2024-10-24 NOTE — Anesthesia Postprocedure Evaluation (Signed)
"   Anesthesia Post Note  Patient: [name]  Procedure(s) Performed: CARDIOVERSION  Patient location during evaluation: PACU Anesthesia Type: General Level of consciousness: awake and alert Pain management: pain level controlled Respiratory status: spontaneous breathing and nonlabored ventilation Cardiovascular status: stable Anesthetic complications: no   No notable events documented.   Last Vitals:  Vitals:   10/24/24 1245 10/24/24 1249  BP:  (!) 91/46  Pulse: 72 (!) 59  Resp: 19 18  Temp:    SpO2: (!) 87% 96%    Last Pain:  Vitals:   10/24/24 1249  TempSrc:   PainSc: 0-No pain                 VAN STAVEREN,Donnica Jarnagin      "

## 2024-10-25 ENCOUNTER — Other Ambulatory Visit: Payer: Self-pay

## 2024-10-25 ENCOUNTER — Encounter: Payer: Self-pay | Admitting: Cardiology

## 2024-10-25 DIAGNOSIS — Q2381 Bicuspid aortic valve: Secondary | ICD-10-CM

## 2024-10-25 DIAGNOSIS — I480 Paroxysmal atrial fibrillation: Secondary | ICD-10-CM

## 2024-10-25 LAB — BASIC METABOLIC PANEL WITH GFR
Anion gap: 11 (ref 5–15)
BUN: 18 mg/dL (ref 8–23)
CO2: 28 mmol/L (ref 22–32)
Calcium: 9 mg/dL (ref 8.9–10.3)
Chloride: 98 mmol/L (ref 98–111)
Creatinine, Ser: 0.85 mg/dL (ref 0.44–1.00)
GFR, Estimated: >60 mL/min
Glucose, Bld: 76 mg/dL (ref 70–99)
Potassium: 3.2 mmol/L — ABNORMAL LOW (ref 3.5–5.1)
Sodium: 137 mmol/L (ref 135–145)

## 2024-10-25 MED ORDER — FUROSEMIDE 80 MG PO TABS
80.0000 mg | ORAL_TABLET | Freq: Every day | ORAL | 1 refills | Status: AC
  Filled 2024-10-25: qty 90, 90d supply, fill #0

## 2024-10-25 MED ORDER — POTASSIUM CHLORIDE CRYS ER 20 MEQ PO TBCR
20.0000 meq | EXTENDED_RELEASE_TABLET | Freq: Every day | ORAL | 1 refills | Status: AC
  Filled 2024-10-25: qty 90, 90d supply, fill #0

## 2024-10-25 MED ORDER — AMIODARONE HCL 200 MG PO TABS
200.0000 mg | ORAL_TABLET | Freq: Every day | ORAL | 1 refills | Status: AC
  Filled 2024-10-25: qty 90, 90d supply, fill #0

## 2024-10-25 MED ORDER — METOPROLOL SUCCINATE ER 25 MG PO TB24: 25.0000 mg | ORAL_TABLET | Freq: Every day | ORAL | Status: DC

## 2024-10-25 MED ORDER — SPIRONOLACTONE 25 MG PO TABS
12.5000 mg | ORAL_TABLET | Freq: Every day | ORAL | 1 refills | Status: AC
  Filled 2024-10-25: qty 45, 90d supply, fill #0

## 2024-10-25 MED ORDER — EMPAGLIFLOZIN 10 MG PO TABS
10.0000 mg | ORAL_TABLET | Freq: Every day | ORAL | 1 refills | Status: AC
  Filled 2024-10-25: qty 30, 30d supply, fill #0

## 2024-10-25 MED ORDER — POTASSIUM CHLORIDE CRYS ER 20 MEQ PO TBCR
40.0000 meq | EXTENDED_RELEASE_TABLET | Freq: Once | ORAL | Status: AC
  Administered 2024-10-25: 40 meq via ORAL
  Filled 2024-10-25: qty 2

## 2024-10-25 MED ORDER — ORAL CARE MOUTH RINSE: 15.0000 mL | OROMUCOSAL | Status: DC | PRN

## 2024-10-25 MED ORDER — ATORVASTATIN CALCIUM 40 MG PO TABS
40.0000 mg | ORAL_TABLET | Freq: Every day | ORAL | 1 refills | Status: AC
  Filled 2024-10-25: qty 90, 90d supply, fill #0

## 2024-10-25 NOTE — Progress Notes (Addendum)
 "  Rounding Note   Patient Name: [name] Date of Encounter: 10/25/2024  [facility] Cardiologist: Evalene Lunger, MD   Subjective S/p successful DCCV yesterday. Maintaining sinus rhythm. Patient endorses feeling well. She worked with therapy this morning and reports that she was unsteady on her feet.   Scheduled Meds:  amiodarone   200 mg Oral Daily   apixaban   5 mg Oral BID   atorvastatin   40 mg Oral Daily   citalopram   20 mg Oral Daily   donepezil   5 mg Oral QHS   empagliflozin   10 mg Oral Daily   furosemide   80 mg Oral Daily   levothyroxine   112 mcg Oral Q0600   loratadine   10 mg Oral Daily   melatonin  10 mg Oral QHS   metoprolol  succinate  50 mg Oral Daily   multivitamin with minerals  1 tablet Oral Daily   polyethylene glycol  17 g Oral Daily   spironolactone   12.5 mg Oral Daily   Continuous Infusions:   PRN Meds: acetaminophen  **OR** acetaminophen , magnesium  hydroxide, ondansetron  **OR** ondansetron  (ZOFRAN ) IV, mouth rinse, traMADol    Vital Signs  Vitals:   10/24/24 2330 10/25/24 0500 10/25/24 0507 10/25/24 0900  BP: (!) 104/53     Pulse: 68  (!) 55   Resp:      Temp: 98.2 F (36.8 C)  98.6 F (37 C) 98.5 F (36.9 C)  TempSrc: Oral  Oral Axillary  SpO2: 96%  96% 95%  Weight:  68 kg    Height:        Intake/Output Summary (Last 24 hours) at 10/25/2024 0933 Last data filed at 10/25/2024 0400 Gross per 24 hour  Intake 840 ml  Output 500 ml  Net 340 ml      10/25/2024    5:00 AM 10/24/2024    5:00 AM 10/23/2024    4:45 AM  Last 3 Weights  Weight (lbs) 149 lb 14.6 oz 164 lb 10.9 oz 152 lb 12.5 oz  Weight (kg) 68 kg 74.7 kg 69.3 kg      Telemetry Sinus rhythm/sinus bradycardia with occasional PACs and PVCs, NSVT up to 5 beats - Personally Reviewed  Physical Exam  GEN: No acute distress.   Neck: No JVD Cardiac: RRR, no murmurs, rubs, or gallops.  Respiratory: Clear to auscultation bilaterally. GI: Soft, nontender, non-distended  MS:  No edema; No deformity. Neuro:  Nonfocal  Psych: Normal affect   Labs High Sensitivity Troponin:  No results for input(s): TROPONINIHS in the last 720 hours.  Recent Labs  Lab 10/13/24 2252  TRNPT 21*       Chemistry Recent Labs  Lab 10/19/24 0556 10/21/24 0411 10/22/24 0515 10/23/24 0425 10/24/24 0421 10/25/24 0452  NA 139 134*   < > 135 134* 137  K 3.4* 3.2*   < > 3.6 3.9 3.2*  CL 99 95*   < > 96* 97* 98  CO2 29 27   < > 27 25 28   GLUCOSE 96 121*   < > 114* 95 76  BUN 20 18   < > 21 21 18   CREATININE 0.73 0.70   < > 0.86 0.88 0.85  CALCIUM  9.3 9.7   < > 9.4 9.0 9.0  MG 2.4 2.5*  --   --   --   --   ALBUMIN  --  3.5  --   --   --   --   GFRNONAA >60 >60   < > >60 >60 >  60  ANIONGAP 11 13   < > 12 12 11    < > = values in this interval not displayed.    Lipids No results for input(s): CHOL, TRIG, HDL, LABVLDL, LDLCALC, CHOLHDL in the last 168 hours.  Hematology No results for input(s): WBC, RBC, HGB, HCT, MCV, MCH, MCHC, RDW, PLT in the last 168 hours.  Thyroid  No results for input(s): TSH, FREET4 in the last 168 hours.  BNPNo results for input(s): BNP, PROBNP in the last 168 hours.  DDimer No results for input(s): DDIMER in the last 168 hours.   Radiology  No results found.  Cardiac Studies  -TEE guided DCCV 10/18/2024; LVEF 35 to 40%, normal RV size and function, mild MR - TTE 09/2024 LVEF 40 to 45%, normal RV size and function - TTE 09/2023 LVEF 50 to 55%, normal RV size and function, mild MR  Patient Profile   84 y.o. female with a past medical history of postoperative atrial fibrillation following left hip surgery, bicuspid aortic valve, hypertension, hyperlipidemia, hypothyroidism, arthritis, dementia, anxiety, OSA not on CPAP, who was admitted on 10/13/2024 with A-fib and CHF.   Assessment & Plan   Persistent atrial fibrillation - Hx post op afib in 09/2023 - Admitted 1/22 with rapid atrial fibrillation, unclear  duration - Echo with EF 35-40% - S/p DCCV 1/27 with recurrence of afib shortly after. Subsequently loaded with IV amiodarone .  - S/p successful DCCV yesterday 2/2 - Continue Eliquis  5 mg twice daily and amiodarone  200 mg daily - Given bradycardia, will reduce metoprolol  succinate to 25 mg daily - Would likely benefit from outpatient evaluation by EP  Acute HFrEF - TEE with EF 35-40% - Likely tachycardia mediated - Appears euvolemic on exam - Continue Jardiance  10 mg daily, metoprolol  succinate 50 mg daily, spironolactone  12.5 mg daily, and Lasix  80 mg daily - Low BP prevents further escalation of GDMT - Consider repeat echo as outpatient to reassess EF in sinus rhythm  Hx bicuspid aortic valve - No evidence of aortopathy or stenosis - Continue to monitor as outpatient   For questions or updates, please contact [facility] Please consult [email or web address] for contact info under       Signed, Lesley LITTIE Maffucci, PA-C  10/25/2024, 9:33 AM    "

## 2024-10-25 NOTE — Progress Notes (Signed)
 Physical Therapy Treatment Patient Details Name: [name] MRN: [id number] DOB: December 13, 1940 Today's Date: 10/25/2024   History of Present Illness [name] is an 83yoF who comes to [facility] on [facility] with CP and SOB, found to be in AF with RVR and acute CHF. PMH HTN, , hypoTSH, osteoarthritis, dementia, and GAD. Pt underwent cardioversion on 10/18/24.    PT Comments  Pt was pleasantly confused during the session but motivated to participate and put forth good effort throughout.  Pt required physical assistance with all functional tasks per below along with multi-modal cuing for sequencing.  Pt was able to maintain static sitting at the EOB with improved stability this session but required near constant physical assist to prevent posterior LOB while in sitting.  Pt was ultimately able to take several small steps near the EOB but only with considerable physical assist and cuing including help guiding the RW.  Pt reported no adverse symptoms during the session with SpO2 and HR WNL throughout on room air. Pt will benefit from continued PT services upon discharge to safely address deficits listed in patient problem list for decreased caregiver assistance and eventual return to PLOF.       If plan is discharge home, recommend the following: A lot of help with bathing/dressing/bathroom;Assistance with cooking/housework;Direct supervision/assist for medications management;Direct supervision/assist for financial management;Assist for transportation;Help with stairs or ramp for entrance;Supervision due to cognitive status;Two people to help with walking and/or transfers   Can travel by private vehicle     No  Equipment Recommendations  None recommended by PT    Recommendations for Other Services       Precautions / Restrictions Precautions Precautions: Fall Recall of Precautions/Restrictions: Impaired Restrictions Weight Bearing Restrictions Per Provider Order: No     Mobility  Bed  Mobility Overal bed mobility: Needs Assistance Bed Mobility: Supine to Sit, Sit to Supine     Supine to sit: Min assist, Mod assist, Used rails Sit to supine: Min assist, Mod assist, Used rails   General bed mobility comments: Min to mod A for BLE and trunk management with heavy cues for sequencing    Transfers Overall transfer level: Needs assistance Equipment used: Rolling walker (2 wheels) Transfers: Sit to/from Stand Sit to Stand: +2 safety/equipment, Min assist           General transfer comment: Mod verbal and tactile cues for hand placement and increased trunk flexion with +2 Min assist to come to standing and to prevent posterior LOB while in standing    Ambulation/Gait Ambulation/Gait assistance: Min assist, +2 safety/equipment Gait Distance (Feet): 4 Feet Assistive device: Rolling walker (2 wheels) Gait Pattern/deviations: Step-to pattern, Shuffle, Leaning posteriorly Gait velocity: decreased     General Gait Details: Pt required frequent min A to prevent posterior LOB and to guide the RW while taking small steps at the EOB with max multi-modal cuing for sequenicng   Stairs             Wheelchair Mobility     Tilt Bed    Modified Rankin (Stroke Patients Only)       Balance Overall balance assessment: Needs assistance Sitting-balance support: Feet supported, Single extremity supported Sitting balance-Leahy Scale: Fair   Postural control: Posterior lean Standing balance support: Bilateral upper extremity supported, During functional activity, Reliant on assistive device for balance Standing balance-Leahy Scale: Poor Standing balance comment: Near constant external support required to prevent posterior LOB  Communication Communication Communication: No apparent difficulties  Cognition Arousal: Alert Behavior During Therapy: WFL for tasks assessed/performed   PT - Cognitive impairments: History of  cognitive impairments, Memory                       PT - Cognition Comments: oriented to self only Following commands: Impaired Following commands impaired: Follows one step commands with increased time    Cueing Cueing Techniques: Verbal cues, Tactile cues, Visual cues  Exercises Total Joint Exercises Long Arc Quad: AROM, Strengthening, Both, 10 reps Knee Flexion: AROM, Strengthening, Both, 10 reps    General Comments        Pertinent Vitals/Pain Pain Assessment Pain Assessment: No/denies pain    Home Living                          Prior Function            PT Goals (current goals can now be found in the care plan section) Progress towards PT goals: PT to reassess next treatment    Frequency    Min 2X/week      PT Plan      Co-evaluation              AM-PAC PT 6 Clicks Mobility   Outcome Measure  Help needed turning from your back to your side while in a flat bed without using bedrails?: A Little Help needed moving from lying on your back to sitting on the side of a flat bed without using bedrails?: A Lot Help needed moving to and from a bed to a chair (including a wheelchair)?: A Lot Help needed standing up from a chair using your arms (e.g., wheelchair or bedside chair)?: A Lot Help needed to walk in hospital room?: Total Help needed climbing 3-5 steps with a railing? : Total 6 Click Score: 11    End of Session Equipment Utilized During Treatment: Gait belt Activity Tolerance: Patient tolerated treatment well Patient left: in bed;with call bell/phone within reach;with bed alarm set;with family/visitor present Nurse Communication: Mobility status PT Visit Diagnosis: Unsteadiness on feet (R26.81);Muscle weakness (generalized) (M62.81);Other abnormalities of gait and mobility (R26.89)     Time: 9073-9052 PT Time Calculation (min) (ACUTE ONLY): 21 min  Charges:    $Therapeutic Activity: 8-22 mins PT General Charges $$  ACUTE PT VISIT: 1 Visit                     D. Scott Raelan Burgoon PT, DPT 10/25/24, 10:49 AM

## 2024-10-25 NOTE — TOC Transition Note (Signed)
 Transition of Care [facility] - Discharge Note   Patient Details  Name: [name] MRN: [id number] Date of Birth: 1941/05/19  Transition of Care [facility]) CM/SW Contact:  Lauraine JAYSON Carpen, LCSW Phone Number: 10/25/2024, 1:40 PM   Clinical Narrative:   Patient has orders to discharge home today. Left message for [facility] liaison to notify. Family is at the bedside to transport. CSW asked [employer] to ship the wheelchair to the home per family request. No further concerns. CSW signing off.  Final next level of care: Home w Home Health Services     Patient Goals and CMS Choice            Discharge Placement                Patient to be transferred to facility by: Family Name of family member notified: Lamar Shivers Patient and family notified of of transfer: 10/25/24  Discharge Plan and Services Additional resources added to the After Visit Summary for                  DME Arranged: Lightweight manual wheelchair with seat cushion DME Agency: [employer] Date DME Agency Contacted: 10/25/24   Representative spoke with at DME Agency: [place] HH Arranged: RN, PT, OT, Nurse's Aide HH Agency: [facility] Date [facility] Agency Contacted: 10/25/24   Representative spoke with at [facility] Agency: [place]  Social Drivers of Health (SDOH) Interventions SDOH Screenings   Food Insecurity: Food Insecurity Present (10/14/2024)  Housing: Low Risk (10/14/2024)  Transportation Needs: No Transportation Needs (10/14/2024)  Utilities: Not At Risk (10/14/2024)  Alcohol  Screen: Low Risk (01/01/2024)  Depression (PHQ2-9): Low Risk (01/01/2024)  Financial Resource Strain: Low Risk (04/12/2024)  Physical Activity: Inactive (01/01/2024)  Social Connections: Moderately Isolated (10/14/2024)  Stress: No Stress Concern Present (04/12/2024)  Tobacco Use: High Risk (04/15/2024)  Health Literacy: Adequate Health Literacy (05/26/2023)     Readmission Risk Interventions     No data to display

## 2024-10-25 NOTE — Plan of Care (Signed)
" °  Problem: Education: Goal: Knowledge of General Education information will improve Description: Including pain rating scale, medication(s)/side effects and non-pharmacologic comfort measures Outcome: Progressing   Problem: Health Behavior/Discharge Planning: Goal: Ability to manage health-related needs will improve Outcome: Progressing   Problem: Clinical Measurements: Goal: Ability to maintain clinical measurements within normal limits will improve Outcome: Progressing Goal: Will remain free from infection Outcome: Progressing Goal: Diagnostic test results will improve Outcome: Progressing Goal: Respiratory complications will improve Outcome: Progressing Goal: Cardiovascular complication will be avoided Outcome: Progressing   Problem: Activity: Goal: Risk for activity intolerance will decrease Outcome: Progressing   Problem: Nutrition: Goal: Adequate nutrition will be maintained Outcome: Progressing   Problem: Coping: Goal: Level of anxiety will decrease Outcome: Progressing   Problem: Elimination: Goal: Will not experience complications related to bowel motility Outcome: Progressing Goal: Will not experience complications related to urinary retention Outcome: Progressing   Problem: Pain Managment: Goal: General experience of comfort will improve and/or be controlled Outcome: Progressing   Problem: Safety: Goal: Ability to remain free from injury will improve Outcome: Progressing   Problem: Skin Integrity: Goal: Risk for impaired skin integrity will decrease Outcome: Progressing   Problem: Education: Goal: Individualized Educational Video(s) Outcome: Progressing   Problem: Activity: Goal: Capacity to carry out activities will improve Outcome: Progressing   Problem: Cardiac: Goal: Ability to achieve and maintain adequate cardiopulmonary perfusion will improve Outcome: Progressing   "

## 2024-10-25 NOTE — Progress Notes (Signed)
 Heart Failure Navigator Progress Note  Still inpatient at this time.  [facility] Clinic TOC rescheduled for 11/07/24 @ 2:30 PM. Updated on AVS and will notify patient of change.  Navigator will sign off at this time.  Charmaine Pines, RN, BSN [facility] Heart Failure Navigator Secure Chat Only

## 2024-10-25 NOTE — TOC Progression Note (Signed)
 Transition of Care [facility]) - Progression Note    Patient Details  Name: [name] MRN: [id number] Date of Birth: 03-May-1941  Transition of Care [facility]) CM/SW Contact  Lauraine JAYSON Carpen, LCSW Phone Number: 10/25/2024, 1:04 PM  Clinical Narrative:   Family is at bedside to transport patient home if discharged today.  Expected Discharge Plan and Services         Expected Discharge Date: 10/23/24                                     Social Drivers of Health (SDOH) Interventions SDOH Screenings   Food Insecurity: Food Insecurity Present (10/14/2024)  Housing: Low Risk (10/14/2024)  Transportation Needs: No Transportation Needs (10/14/2024)  Utilities: Not At Risk (10/14/2024)  Alcohol  Screen: Low Risk (01/01/2024)  Depression (PHQ2-9): Low Risk (01/01/2024)  Financial Resource Strain: Low Risk (04/12/2024)  Physical Activity: Inactive (01/01/2024)  Social Connections: Moderately Isolated (10/14/2024)  Stress: No Stress Concern Present (04/12/2024)  Tobacco Use: High Risk (04/15/2024)  Health Literacy: Adequate Health Literacy (05/26/2023)    Readmission Risk Interventions     No data to display

## 2024-10-26 ENCOUNTER — Telehealth: Payer: Self-pay

## 2024-10-26 ENCOUNTER — Ambulatory Visit: Admitting: Family

## 2024-10-26 NOTE — Transitions of Care (Post Inpatient/ED Visit) (Signed)
 "  10/26/2024  Name: [name] MRN: [id number] DOB: 1941/03/14  Today's TOC FU Call Status: Today's TOC FU Call Status:: Successful TOC FU Call Completed TOC FU Call Complete Date: 10/26/24  Patient's Name and Date of Birth confirmed. Name, DOB  Transition Care Management Follow-up Telephone Call Date of Discharge: 10/25/24 Discharge Facility: [facility]) Type of Discharge: Inpatient Admission Primary Inpatient Discharge Diagnosis:: A-fib; Heart Failure How have you been since you were released from the hospital?: Better Any questions or concerns?: No  Items Reviewed: Did you receive and understand the discharge instructions provided?: Yes Medications obtained,verified, and reconciled?: Yes (Medications Reviewed) Any new allergies since your discharge?: No Dietary orders reviewed?: Yes Type of Diet Ordered:: Heart Healthy Do you have support at home?: Yes People in Home [RPT]: spouse Name of Support/Comfort Primary Source: Lamar Shivers  Medications Reviewed Today: Medications Reviewed Today     Reviewed by Moises Reusing, RN (Case Manager) on 10/26/24 at 1306  Med List Status: <None>   Medication Order Taking? Sig Documenting Provider Last Dose Status Informant  acetaminophen  (TYLENOL ) 500 MG tablet [id number] No Take 500 mg by mouth every 6 (six) hours as needed for moderate pain.  [provider] Unknown Active Pharmacy Records, Spouse/Significant Other  amiodarone  (PACERONE ) 200 MG tablet [id number]  Take 1 tablet (200 mg total) by mouth daily. Wouk, Devaughn Sayres, MD  Active   apixaban  (ELIQUIS ) 5 MG TABS tablet [id number] No Take 1 tablet (5 mg total) by mouth 2 (two) times daily. Gerard Frederick, NP 10/13/2024 Morning Active Spouse/Significant Other, Pharmacy Records  atorvastatin  (LIPITOR) 40 MG tablet [id number]  Take 1 tablet (40 mg total) by mouth daily. Wouk, Devaughn Sayres, MD  Active   cetirizine  (ZYRTEC ) 10 MG tablet  [id number] No Take 10 mg by mouth daily. [provider] Unknown Active Spouse/Significant Other, Pharmacy Records  citalopram  (CELEXA ) 20 MG tablet [id number] No TAKE 1 TABLET(20 MG) BY MOUTH DAILY Antonette Angeline ORN, NP 10/13/2024 Active Spouse/Significant Other, Pharmacy Records  donepezil  (ARICEPT  ODT) 5 MG disintegrating tablet [id number] No DISSOLVE 1 TABLET(5 MG) ON THE TONGUE AT BEDTIME Antonette Angeline ORN, NP 10/12/2024 Active Spouse/Significant Other, Pharmacy Records  empagliflozin  (JARDIANCE ) 10 MG TABS tablet [id number]  Take 1 tablet (10 mg total) by mouth daily. Wouk, Devaughn Sayres, MD  Active   furosemide  (LASIX ) 80 MG tablet [id number]  Take 1 tablet (80 mg total) by mouth daily. Wouk, Devaughn Sayres, MD  Active   levothyroxine  (SYNTHROID ) 112 MCG tablet [id number] No Take 1 tablet (112 mcg total) by mouth daily. Antonette Angeline ORN, NP 10/13/2024 Active Spouse/Significant Other, Pharmacy Records  metoprolol  succinate (TOPROL -XL) 25 MG 24 hr tablet [id number] No TAKE 1 TABLET BY MOUTH EVERY DAY WITH OR IMMEDIATELY FOLLOWING A MEAL Gollan, Timothy J, MD 10/13/2024 Active Spouse/Significant Other, Pharmacy Records  Multiple Vitamin (MULTIVITAMIN) capsule [id number] No Take 1 capsule by mouth daily. [provider] 10/13/2024 Active Pharmacy Records, Spouse/Significant Other  potassium chloride  SA (KLOR-CON  M) 20 MEQ tablet [id number]  Take 1 tablet (20 mEq total) by mouth daily. Wouk, Devaughn Sayres, MD  Active   spironolactone  (ALDACTONE ) 25 MG tablet [id number]  Take 0.5 tablets (12.5 mg total) by mouth daily. Kandis Devaughn Sayres, MD  Active   traZODone  (DESYREL ) 50 MG tablet [id number] No TAKE 1 TABLET(50 MG) BY MOUTH AT BEDTIME AS NEEDED FOR SLEEP Baity, Angeline ORN, NP Unknown Active Spouse/Significant Other, Pharmacy Records            Home  Care and Equipment/Supplies: Were Home Health Services Ordered?: Yes Name of Home Health Agency:: [facility] Has Agency set up a time to come to your home?:  No EMR reviewed for Home Health Orders: Orders present/patient has not received call (refer to CM for follow-up) Any new equipment or medical supplies ordered?: Yes Name of Medical supply agency?: [employer] Were you able to get the equipment/medical supplies?: Yes Do you have any questions related to the use of the equipment/supplies?: No  Functional Questionnaire: Do you need assistance with bathing/showering or dressing?: Yes (The spouse assists due to cognitive deficit) Do you need assistance with meal preparation?: Yes (The spouse assists due to cognitive deficit) Do you need assistance with eating?: No Do you have difficulty maintaining continence: Yes (The spouse assists due to cognitive deficit) Do you need assistance with getting out of bed/getting out of a chair/moving?: No Do you have difficulty managing or taking your medications?: Yes (The spouse assists due to cognitive deficit)  Follow up appointments reviewed: PCP Follow-up appointment confirmed?: Yes Date of PCP follow-up appointment?: 11/01/24 Follow-up Provider: [facility] Follow-up appointment confirmed?: Yes Date of Specialist follow-up appointment?: 11/07/24 Follow-Up Specialty Provider:: Ellouise Class, HF clinic Do you need transportation to your follow-up appointment?: No Do you understand care options if your condition(s) worsen?: Yes-patient verbalized understanding  SDOH Interventions Today    Flowsheet Row Most Recent Value  SDOH Interventions   Food Insecurity Interventions Intervention Not Indicated  Housing Interventions Intervention Not Indicated  Transportation Interventions Intervention Not Indicated  Utilities Interventions Intervention Not Indicated    Goals Addressed             This Visit's Progress    VBCI Transitions of Care (TOC) Care Plan       Problems:  Recent Hospitalization for treatment of CHF Knowledge Deficit Related to Congestive Heart Failure and Hospital  or ED Adm Risk of 46%  Goal:  Over the next 30 days, the patient will not experience hospital readmission  Interventions:   Heart Failure Interventions: Basic overview and discussion of pathophysiology of Heart Failure reviewed Provided education on low sodium diet Assessed need for readable accurate scales in home Reviewed role of diuretics in prevention of fluid overload and management of heart failure; Discussed the importance of keeping all appointments with provider Advised patient to discuss new Heart Failure Diagnosis  with provider Assessed social determinant of health barriers  Take Lasix  and Spironolactone  daily as directed Assess feet and lower extremities for swelling  Encouraged purchase of a scale for daily weights  Patient Self Care Activities:  Attend all scheduled provider appointments Call pharmacy for medication refills 3-7 days in advance of running out of medications Call provider office for new concerns or questions  Notify RN Care Manager of [facility] call rescheduling needs Participate in Transition of Care Program/Attend TOC scheduled calls Take medications as prescribed    Plan:  Telephone follow up appointment with care management team member scheduled for:  Wednesday February 11th at 11:30am         Medford Balboa, BSN, RN [place]  VBCI - Population Health RN Care Manager [phone]  "

## 2024-10-26 NOTE — Patient Instructions (Signed)
 Visit Information  Thank you for taking time to visit with me today. Please don't hesitate to contact me if I can be of assistance to you before our next scheduled telephone appointment.  Our next appointment is by telephone on Wednesday February 11th at 11:30am  Following is a copy of your care plan:   Goals Addressed             This Visit's Progress    [facility] Transitions of Care (TOC) Care Plan       Problems:  Recent Hospitalization for treatment of CHF Knowledge Deficit Related to Congestive Heart Failure and Hospital or ED Adm Risk of 46%  Goal:  Over the next 30 days, the patient will not experience hospital readmission  Interventions:   Heart Failure Interventions: Basic overview and discussion of pathophysiology of Heart Failure reviewed Provided education on low sodium diet Assessed need for readable accurate scales in home Reviewed role of diuretics in prevention of fluid overload and management of heart failure; Discussed the importance of keeping all appointments with provider Advised patient to discuss new Heart Failure Diagnosis  with provider Assessed social determinant of health barriers  Take Lasix  and Spironolactone  daily as directed Assess feet and lower extremities for swelling  Encouraged purchase of a scale for daily weights  Patient Self Care Activities:  Attend all scheduled provider appointments Call pharmacy for medication refills 3-7 days in advance of running out of medications Call provider office for new concerns or questions  Notify RN Care Manager of [facility] call rescheduling needs Participate in Transition of Care Program/Attend TOC scheduled calls Take medications as prescribed    Plan:  Telephone follow up appointment with care management team member scheduled for:  Wednesday February 11th at 11:30am        Patient verbalizes understanding of instructions and care plan provided today and agrees to view in MyChart. Active MyChart status  and patient understanding of how to access instructions and care plan via MyChart confirmed with patient.     The patient has been provided with contact information for the care management team and has been advised to call with any health related questions or concerns.   Please call the care guide team at [phone] if you need to cancel or reschedule your appointment.   Please call the Suicide and Crisis Lifeline: 988 call the [place]  National Suicide Prevention Lifeline: 7632814258 or TTY: 5341587012 TTY [phone]) to talk to a trained counselor if you are experiencing a Mental Health or Behavioral Health Crisis or need someone to talk to.  Medford Balboa, BSN, RN   [facility] - [employer] [facility] RN Care Manager [phone]

## 2024-10-28 ENCOUNTER — Ambulatory Visit: Admitting: Adult Health

## 2024-11-01 ENCOUNTER — Inpatient Hospital Stay: Admitting: Internal Medicine

## 2024-11-02 ENCOUNTER — Telehealth

## 2024-11-07 ENCOUNTER — Ambulatory Visit: Admitting: Family

## 2024-11-08 ENCOUNTER — Ambulatory Visit: Admitting: Cardiovascular Disease

## 2025-01-06 ENCOUNTER — Ambulatory Visit

## 2025-01-11 ENCOUNTER — Ambulatory Visit
